# Patient Record
Sex: Female | Born: 1945
Health system: Southern US, Community
[De-identification: ages and names within clinical notes are randomized; demographics above are authoritative.]

## PROBLEM LIST (undated history)

## (undated) DIAGNOSIS — I509 Heart failure, unspecified: Secondary | ICD-10-CM

## (undated) DIAGNOSIS — M1990 Unspecified osteoarthritis, unspecified site: Secondary | ICD-10-CM

## (undated) DIAGNOSIS — I639 Cerebral infarction, unspecified: Secondary | ICD-10-CM

## (undated) DIAGNOSIS — N186 End stage renal disease: Secondary | ICD-10-CM

## (undated) DIAGNOSIS — H3530 Unspecified macular degeneration: Secondary | ICD-10-CM

## (undated) DIAGNOSIS — D649 Anemia, unspecified: Secondary | ICD-10-CM

## (undated) DIAGNOSIS — E785 Hyperlipidemia, unspecified: Secondary | ICD-10-CM

## (undated) DIAGNOSIS — I10 Essential (primary) hypertension: Secondary | ICD-10-CM

## (undated) DIAGNOSIS — N189 Chronic kidney disease, unspecified: Secondary | ICD-10-CM

## (undated) DIAGNOSIS — T8859XA Other complications of anesthesia, initial encounter: Secondary | ICD-10-CM

## (undated) DIAGNOSIS — M109 Gout, unspecified: Secondary | ICD-10-CM

## (undated) DIAGNOSIS — T4145XA Adverse effect of unspecified anesthetic, initial encounter: Secondary | ICD-10-CM

## (undated) HISTORY — PX: TONSILLECTOMY: SUR1361

## (undated) HISTORY — PX: ABDOMINAL HYSTERECTOMY: SHX81

## (undated) HISTORY — PX: APPENDECTOMY: SHX54

---

## 2000-05-30 ENCOUNTER — Inpatient Hospital Stay (HOSPITAL_COMMUNITY): Admission: EM | Admit: 2000-05-30 | Discharge: 2000-06-02 | Payer: Self-pay | Admitting: *Deleted

## 2005-12-06 ENCOUNTER — Ambulatory Visit: Payer: Self-pay | Admitting: Internal Medicine

## 2006-12-26 ENCOUNTER — Ambulatory Visit: Payer: Self-pay | Admitting: Internal Medicine

## 2008-02-26 ENCOUNTER — Ambulatory Visit: Payer: Self-pay | Admitting: Internal Medicine

## 2009-06-25 ENCOUNTER — Emergency Department: Payer: Self-pay | Admitting: Emergency Medicine

## 2013-09-03 DIAGNOSIS — R5381 Other malaise: Secondary | ICD-10-CM | POA: Diagnosis not present

## 2013-09-03 DIAGNOSIS — R300 Dysuria: Secondary | ICD-10-CM | POA: Diagnosis not present

## 2013-09-03 DIAGNOSIS — Z0000 Encounter for general adult medical examination without abnormal findings: Secondary | ICD-10-CM | POA: Diagnosis not present

## 2013-09-03 DIAGNOSIS — E782 Mixed hyperlipidemia: Secondary | ICD-10-CM | POA: Diagnosis not present

## 2013-09-03 DIAGNOSIS — I6789 Other cerebrovascular disease: Secondary | ICD-10-CM | POA: Diagnosis not present

## 2013-09-03 DIAGNOSIS — I10 Essential (primary) hypertension: Secondary | ICD-10-CM | POA: Diagnosis not present

## 2013-09-03 DIAGNOSIS — Z124 Encounter for screening for malignant neoplasm of cervix: Secondary | ICD-10-CM | POA: Diagnosis not present

## 2013-09-03 DIAGNOSIS — Z1272 Encounter for screening for malignant neoplasm of vagina: Secondary | ICD-10-CM | POA: Diagnosis not present

## 2013-09-03 DIAGNOSIS — N898 Other specified noninflammatory disorders of vagina: Secondary | ICD-10-CM | POA: Diagnosis not present

## 2013-09-17 ENCOUNTER — Ambulatory Visit: Payer: Self-pay

## 2013-09-17 DIAGNOSIS — Z0000 Encounter for general adult medical examination without abnormal findings: Secondary | ICD-10-CM | POA: Diagnosis not present

## 2013-09-17 DIAGNOSIS — E559 Vitamin D deficiency, unspecified: Secondary | ICD-10-CM | POA: Diagnosis not present

## 2013-09-17 DIAGNOSIS — E782 Mixed hyperlipidemia: Secondary | ICD-10-CM | POA: Diagnosis not present

## 2013-09-17 DIAGNOSIS — I10 Essential (primary) hypertension: Secondary | ICD-10-CM | POA: Diagnosis not present

## 2013-09-17 DIAGNOSIS — Z1231 Encounter for screening mammogram for malignant neoplasm of breast: Secondary | ICD-10-CM | POA: Diagnosis not present

## 2014-11-30 DIAGNOSIS — M109 Gout, unspecified: Secondary | ICD-10-CM | POA: Diagnosis not present

## 2014-11-30 DIAGNOSIS — R7301 Impaired fasting glucose: Secondary | ICD-10-CM | POA: Diagnosis not present

## 2014-11-30 DIAGNOSIS — I10 Essential (primary) hypertension: Secondary | ICD-10-CM | POA: Diagnosis not present

## 2014-11-30 DIAGNOSIS — E782 Mixed hyperlipidemia: Secondary | ICD-10-CM | POA: Diagnosis not present

## 2015-01-12 DIAGNOSIS — Z0001 Encounter for general adult medical examination with abnormal findings: Secondary | ICD-10-CM | POA: Diagnosis not present

## 2015-01-12 DIAGNOSIS — R7301 Impaired fasting glucose: Secondary | ICD-10-CM | POA: Diagnosis not present

## 2015-01-12 DIAGNOSIS — I10 Essential (primary) hypertension: Secondary | ICD-10-CM | POA: Diagnosis not present

## 2015-01-12 DIAGNOSIS — E559 Vitamin D deficiency, unspecified: Secondary | ICD-10-CM | POA: Diagnosis not present

## 2015-01-12 DIAGNOSIS — E782 Mixed hyperlipidemia: Secondary | ICD-10-CM | POA: Diagnosis not present

## 2015-01-12 DIAGNOSIS — M109 Gout, unspecified: Secondary | ICD-10-CM | POA: Diagnosis not present

## 2015-01-13 DIAGNOSIS — Z1231 Encounter for screening mammogram for malignant neoplasm of breast: Secondary | ICD-10-CM | POA: Diagnosis not present

## 2015-07-30 DIAGNOSIS — M25551 Pain in right hip: Secondary | ICD-10-CM | POA: Diagnosis not present

## 2015-07-30 DIAGNOSIS — Z634 Disappearance and death of family member: Secondary | ICD-10-CM | POA: Diagnosis not present

## 2015-07-30 DIAGNOSIS — M109 Gout, unspecified: Secondary | ICD-10-CM | POA: Diagnosis not present

## 2015-07-30 DIAGNOSIS — I10 Essential (primary) hypertension: Secondary | ICD-10-CM | POA: Diagnosis not present

## 2015-08-03 DIAGNOSIS — R944 Abnormal results of kidney function studies: Secondary | ICD-10-CM | POA: Diagnosis not present

## 2015-08-04 DIAGNOSIS — N390 Urinary tract infection, site not specified: Secondary | ICD-10-CM | POA: Diagnosis not present

## 2015-08-04 DIAGNOSIS — S90561A Insect bite (nonvenomous), right ankle, initial encounter: Secondary | ICD-10-CM | POA: Diagnosis not present

## 2015-08-04 DIAGNOSIS — I10 Essential (primary) hypertension: Secondary | ICD-10-CM | POA: Diagnosis not present

## 2015-08-04 DIAGNOSIS — R2241 Localized swelling, mass and lump, right lower limb: Secondary | ICD-10-CM | POA: Diagnosis not present

## 2015-09-15 DIAGNOSIS — R944 Abnormal results of kidney function studies: Secondary | ICD-10-CM | POA: Diagnosis not present

## 2015-09-15 DIAGNOSIS — R87810 Cervical high risk human papillomavirus (HPV) DNA test positive: Secondary | ICD-10-CM | POA: Diagnosis not present

## 2015-09-15 DIAGNOSIS — Z0001 Encounter for general adult medical examination with abnormal findings: Secondary | ICD-10-CM | POA: Diagnosis not present

## 2015-09-15 DIAGNOSIS — M109 Gout, unspecified: Secondary | ICD-10-CM | POA: Diagnosis not present

## 2015-09-15 DIAGNOSIS — I10 Essential (primary) hypertension: Secondary | ICD-10-CM | POA: Diagnosis not present

## 2015-09-15 DIAGNOSIS — Z124 Encounter for screening for malignant neoplasm of cervix: Secondary | ICD-10-CM | POA: Diagnosis not present

## 2015-12-13 ENCOUNTER — Ambulatory Visit
Admission: RE | Admit: 2015-12-13 | Payer: Medicare Other | Source: Ambulatory Visit | Admitting: Unknown Physician Specialty

## 2015-12-13 ENCOUNTER — Encounter: Admission: RE | Payer: Self-pay | Source: Ambulatory Visit

## 2015-12-13 HISTORY — DX: Hyperlipidemia, unspecified: E78.5

## 2015-12-13 HISTORY — DX: Cerebral infarction, unspecified: I63.9

## 2015-12-13 HISTORY — DX: Essential (primary) hypertension: I10

## 2015-12-13 HISTORY — DX: Gout, unspecified: M10.9

## 2015-12-13 SURGERY — COLONOSCOPY WITH PROPOFOL
Anesthesia: General

## 2016-01-14 ENCOUNTER — Encounter
Admission: RE | Disposition: A | Discharge status: Home / Self Care | Payer: Self-pay | Source: Ambulatory Visit | Attending: Unknown Physician Specialty

## 2016-01-14 ENCOUNTER — Ambulatory Visit
Admission: RE | Admit: 2016-01-14 | Discharge: 2016-01-14 | Disposition: A | Discharge status: Home / Self Care | Payer: Medicare Other | Source: Ambulatory Visit | Attending: Unknown Physician Specialty | Admitting: Unknown Physician Specialty

## 2016-01-14 ENCOUNTER — Ambulatory Visit: Payer: Medicare Other | Admitting: Anesthesiology

## 2016-01-14 DIAGNOSIS — Z87891 Personal history of nicotine dependence: Secondary | ICD-10-CM | POA: Diagnosis not present

## 2016-01-14 DIAGNOSIS — K5790 Diverticulosis of intestine, part unspecified, without perforation or abscess without bleeding: Secondary | ICD-10-CM | POA: Diagnosis not present

## 2016-01-14 DIAGNOSIS — D122 Benign neoplasm of ascending colon: Secondary | ICD-10-CM | POA: Insufficient documentation

## 2016-01-14 DIAGNOSIS — Z880 Allergy status to penicillin: Secondary | ICD-10-CM | POA: Diagnosis not present

## 2016-01-14 DIAGNOSIS — M109 Gout, unspecified: Secondary | ICD-10-CM | POA: Insufficient documentation

## 2016-01-14 DIAGNOSIS — Z79899 Other long term (current) drug therapy: Secondary | ICD-10-CM | POA: Insufficient documentation

## 2016-01-14 DIAGNOSIS — D120 Benign neoplasm of cecum: Secondary | ICD-10-CM | POA: Insufficient documentation

## 2016-01-14 DIAGNOSIS — K640 First degree hemorrhoids: Secondary | ICD-10-CM | POA: Insufficient documentation

## 2016-01-14 DIAGNOSIS — I10 Essential (primary) hypertension: Secondary | ICD-10-CM | POA: Diagnosis not present

## 2016-01-14 DIAGNOSIS — K635 Polyp of colon: Secondary | ICD-10-CM | POA: Diagnosis not present

## 2016-01-14 DIAGNOSIS — Z1211 Encounter for screening for malignant neoplasm of colon: Secondary | ICD-10-CM | POA: Insufficient documentation

## 2016-01-14 DIAGNOSIS — I6930 Unspecified sequelae of cerebral infarction: Secondary | ICD-10-CM | POA: Insufficient documentation

## 2016-01-14 DIAGNOSIS — E785 Hyperlipidemia, unspecified: Secondary | ICD-10-CM | POA: Insufficient documentation

## 2016-01-14 HISTORY — PX: COLONOSCOPY WITH PROPOFOL: SHX5780

## 2016-01-14 LAB — GLUCOSE, CAPILLARY: Glucose-Capillary: 80 mg/dL (ref 65–99)

## 2016-01-14 SURGERY — COLONOSCOPY WITH PROPOFOL
Anesthesia: General

## 2016-01-14 MED ORDER — FENTANYL CITRATE (PF) 100 MCG/2ML IJ SOLN
INTRAMUSCULAR | Status: DC | PRN
  Administered 2016-01-14: 50 ug via INTRAVENOUS

## 2016-01-14 MED ORDER — SODIUM CHLORIDE 0.9 % IV SOLN: INTRAVENOUS | Status: DC

## 2016-01-14 MED ORDER — PROPOFOL 10 MG/ML IV BOLUS
INTRAVENOUS | Status: DC | PRN
Start: 2016-01-14 — End: 2016-01-14
  Administered 2016-01-14: 30 mg via INTRAVENOUS
  Administered 2016-01-14: 20 mg via INTRAVENOUS

## 2016-01-14 MED ORDER — SODIUM CHLORIDE 0.9 % IV SOLN
INTRAVENOUS | Status: DC
  Administered 2016-01-14: 1000 mL via INTRAVENOUS

## 2016-01-14 MED ORDER — LIDOCAINE HCL (PF) 2 % IJ SOLN
INTRAMUSCULAR | Status: DC | PRN
  Administered 2016-01-14: 60 mg

## 2016-01-14 MED ORDER — MIDAZOLAM HCL 5 MG/5ML IJ SOLN
INTRAMUSCULAR | Status: DC | PRN
  Administered 2016-01-14: 1 mg via INTRAVENOUS

## 2016-01-14 MED ORDER — PHENYLEPHRINE HCL 10 MG/ML IJ SOLN
INTRAMUSCULAR | Status: DC | PRN
  Administered 2016-01-14: 100 ug via INTRAVENOUS

## 2016-01-14 MED ORDER — PROPOFOL 500 MG/50ML IV EMUL
INTRAVENOUS | Status: DC | PRN
  Administered 2016-01-14: 100 ug/kg/min via INTRAVENOUS

## 2016-01-14 NOTE — Transfer of Care (Signed)
Immediate Anesthesia Transfer of Care Note  Patient: Kristen Francis  Procedure(s) Performed: Procedure(s): COLONOSCOPY WITH PROPOFOL (N/A)  Patient Location: PACU  Anesthesia Type:General  Level of Consciousness: sedated  Airway & Oxygen Therapy: Patient Spontanous Breathing and Patient connected to nasal cannula oxygen  Post-op Assessment: Report given to RN and Post -op Vital signs reviewed and stable  Post vital signs: Reviewed and stable  Last Vitals:  Filed Vitals:   01/14/16 0904  BP: 158/98  Pulse: 85  Temp: 36.2 C  Resp: 16    Complications: No apparent anesthesia complications

## 2016-01-14 NOTE — Anesthesia Postprocedure Evaluation (Signed)
Anesthesia Post Note  Patient: Kristen Francis  Procedure(s) Performed: Procedure(s) (LRB): COLONOSCOPY WITH PROPOFOL (N/A)  Patient location during evaluation: PACU Anesthesia Type: General Level of consciousness: awake and alert Pain management: pain level controlled Vital Signs Assessment: post-procedure vital signs reviewed and stable Respiratory status: spontaneous breathing, nonlabored ventilation, respiratory function stable and patient connected to nasal cannula oxygen Cardiovascular status: blood pressure returned to baseline and stable Postop Assessment: no signs of nausea or vomiting Anesthetic complications: no    Last Vitals:  Filed Vitals:   01/14/16 1020 01/14/16 1030  BP: 135/82 135/77  Pulse: 66 64  Temp:    Resp: 11 11    Last Pain: There were no vitals filed for this visit.               Molli Barrows

## 2016-01-14 NOTE — H&P (Signed)
   Primary Care Physician:  Lavera Guise, MD Primary Gastroenterologist:  Dr. Vira Agar  Pre-Procedure History & Physical: HPI:  Kristen Francis is a 70 y.o. female is here for an colonoscopy.   Past Medical History  Diagnosis Date  . Stroke ([place])   . Hypertension   . Hyperlipemia   . Gout     Past Surgical History  Procedure Laterality Date  . Abdominal hysterectomy    . Appendectomy      Prior to Admission medications   Medication Sig Start Date End Date Taking? Authorizing Provider  allopurinol (ZYLOPRIM) 100 MG tablet Take 100 mg by mouth daily.   Yes Historical Provider, MD  amLODipine (NORVASC) 10 MG tablet Take 10 mg by mouth daily.   Yes Historical Provider, MD  carvedilol (COREG) 6.25 MG tablet Take 6.25 mg by mouth 2 (two) times daily with a meal.   Yes Historical Provider, MD  hydrochlorothiazide (HYDRODIURIL) 25 MG tablet Take 25 mg by mouth daily.   Yes Historical Provider, MD  sulfamethoxazole-trimethoprim (BACTRIM DS,SEPTRA DS) 800-160 MG tablet Take 1 tablet by mouth 2 (two) times daily.   Yes Historical Provider, MD    Allergies as of 12/20/2015 - Review Complete 12/10/2015  Allergen Reaction Noted  . Penicillins  12/10/2015    No family history on file.  Social History   Social History  . Marital Status: Married    Spouse Name: N/A  . Number of Children: N/A  . Years of Education: N/A   Occupational History  . Not on file.   Social History Main Topics  . Smoking status: Former Research scientist (life sciences)  . Smokeless tobacco: Not on file  . Alcohol Use: Not on file  . Drug Use: Not on file  . Sexual Activity: Not on file   Other Topics Concern  . Not on file   Social History Narrative  . No narrative on file    Review of Systems: See HPI, otherwise negative ROS  Physical Exam: BP 158/98 mmHg  Pulse 85  Temp(Src) 97.2 F (36.2 C) (Tympanic)  Resp 16  Ht 5\' 6"  (1.676 m)  Wt 77.565 kg (171 lb)  BMI 27.61 kg/m2  SpO2 100% General:   Alert,  pleasant  and cooperative in NAD Head:  Normocephalic and atraumatic. Neck:  Supple; no masses or thyromegaly. Lungs:  Clear throughout to auscultation.    Heart:  Regular rate and rhythm. Abdomen:  Soft, nontender and nondistended. Normal bowel sounds, without guarding, and without rebound.   Neurologic:  Alert and  oriented x4;  grossly normal neurologically.  Impression/Plan: Kristen Francis is here for an colonoscopy to be performed for screening  Risks, benefits, limitations, and alternatives regarding  colonoscopy have been reviewed with the patient.  Questions have been answered.  All parties agreeable.   Gaylyn Cheers, MD  01/14/2016, 9:19 AM

## 2016-01-14 NOTE — Op Note (Signed)
[facility] Patient Name: Kristen Francis Procedure Date: 01/14/2016 9:23 AM MRN: [id number] Account #: [id number] Date of Birth: 1946/05/04 Admit Type: Outpatient Age: 70 Room: [facility] ENDO ROOM 1 Gender: Female Note Status: Finalized Procedure:         Colonoscopy Indications:       Screening for colorectal malignant neoplasm Providers:         Manya Silvas, MD Referring MD:      Peterson Lombard (Referring MD) Medicines:         Propofol per Anesthesia Complications:     No immediate complications. Procedure:         Pre-Anesthesia Assessment:                    - After reviewing the risks and benefits, the patient was                     deemed in satisfactory condition to undergo the procedure.                    After obtaining informed consent, the colonoscope was                     passed under direct vision. Throughout the procedure, the                     patient's blood pressure, pulse, and oxygen saturations                     were monitored continuously. The Colonoscope was                     introduced through the anus and advanced to the the cecum,                     identified by appendiceal orifice and ileocecal valve. The                     colonoscopy was performed without difficulty. The patient                     tolerated the procedure well. The quality of the bowel                     preparation was excellent. Findings:      A diminutive polyp was found in the cecum. The polyp was sessile. The       polyp was removed with a jumbo cold forceps. Resection and retrieval       were complete.      A diminutive polyp was found in the proximal ascending colon. The polyp       was sessile. The polyp was removed with a jumbo cold forceps. Resection       and retrieval were complete.      Internal hemorrhoids were found during endoscopy. The hemorrhoids were       small and Grade I (internal hemorrhoids that do not prolapse).     The exam was otherwise without abnormality. Impression:        - One diminutive polyp in the cecum. Resected and                     retrieved.                    -  One diminutive polyp in the proximal ascending colon.                     Resected and retrieved.                    - Internal hemorrhoids.                    - The examination was otherwise normal. Recommendation:    - Await pathology results. Manya Silvas, MD 01/14/2016 9:50:59 AM This report has been signed electronically. Number of Addenda: 0 Note Initiated On: 01/14/2016 9:23 AM Scope Withdrawal Time: 0 hours 11 minutes 27 seconds  Total Procedure Duration: 0 hours 16 minutes 1 second       [facility]

## 2016-01-14 NOTE — Anesthesia Preprocedure Evaluation (Signed)
Anesthesia Evaluation  Patient identified by MRN, date of birth, ID band Patient awake    Reviewed: Allergy & Precautions, H&P , NPO status , Patient's Chart, lab work & pertinent test results, reviewed documented beta blocker date and time   Airway Mallampati: II   Neck ROM: full    Dental  (+) Teeth Intact, Implants   Pulmonary neg pulmonary ROS, former smoker,    Pulmonary exam normal        Cardiovascular Exercise Tolerance: Good hypertension, negative cardio ROS Normal cardiovascular exam     Neuro/Psych Right upper and lower weakness sp stroke CVA, Residual Symptoms negative neurological ROS  negative psych ROS   GI/Hepatic negative GI ROS, Neg liver ROS,   Endo/Other  negative endocrine ROS  Renal/GU negative Renal ROS  negative genitourinary   Musculoskeletal   Abdominal   Peds  Hematology negative hematology ROS (+)   Anesthesia Other Findings Past Medical History:   Stroke ([place])                                                 Hypertension                                                 Hyperlipemia                                                 Gout                                                       Past Surgical History:   ABDOMINAL HYSTERECTOMY                                        APPENDECTOMY                                                BMI    Body Mass Index   27.61 kg/m 2     Reproductive/Obstetrics                             Anesthesia Physical Anesthesia Plan  ASA: III  Anesthesia Plan: General   Post-op Pain Management:    Induction:   Airway Management Planned:   Additional Equipment:   Intra-op Plan:   Post-operative Plan:   Informed Consent: I have reviewed the patients History and Physical, chart, labs and discussed the procedure including the risks, benefits and alternatives for the proposed anesthesia with the patient or authorized  representative who has indicated his/her understanding and acceptance.   Dental Advisory Given  Plan Discussed with: CRNA  Anesthesia Plan Comments:  Anesthesia Quick Evaluation  

## 2016-01-15 ENCOUNTER — Encounter: Payer: Self-pay | Admitting: Unknown Physician Specialty

## 2016-01-17 LAB — SURGICAL PATHOLOGY

## 2016-08-08 ENCOUNTER — Other Ambulatory Visit: Payer: Self-pay

## 2016-10-08 ENCOUNTER — Emergency Department
Admission: EM | Admit: 2016-10-08 | Discharge: 2016-10-09 | Disposition: A | Discharge status: Home / Self Care | Payer: Medicare Other | Attending: Emergency Medicine | Admitting: Emergency Medicine

## 2016-10-08 DIAGNOSIS — N189 Chronic kidney disease, unspecified: Secondary | ICD-10-CM | POA: Diagnosis not present

## 2016-10-08 DIAGNOSIS — E860 Dehydration: Secondary | ICD-10-CM | POA: Insufficient documentation

## 2016-10-08 DIAGNOSIS — Z79899 Other long term (current) drug therapy: Secondary | ICD-10-CM | POA: Diagnosis not present

## 2016-10-08 DIAGNOSIS — R197 Diarrhea, unspecified: Secondary | ICD-10-CM

## 2016-10-08 DIAGNOSIS — N289 Disorder of kidney and ureter, unspecified: Secondary | ICD-10-CM

## 2016-10-08 DIAGNOSIS — Z87891 Personal history of nicotine dependence: Secondary | ICD-10-CM | POA: Insufficient documentation

## 2016-10-08 DIAGNOSIS — I10 Essential (primary) hypertension: Secondary | ICD-10-CM | POA: Insufficient documentation

## 2016-10-08 LAB — CBC
HCT: 28.1 % — ABNORMAL LOW (ref 35.0–47.0)
HEMOGLOBIN: 9.6 g/dL — AB (ref 12.0–16.0)
MCH: 27.7 pg (ref 26.0–34.0)
MCHC: 34.1 g/dL (ref 32.0–36.0)
MCV: 81.4 fL (ref 80.0–100.0)
Platelets: 308 10*3/uL (ref 150–440)
RBC: 3.45 MIL/uL — AB (ref 3.80–5.20)
RDW: 17 % — ABNORMAL HIGH (ref 11.5–14.5)
WBC: 6.1 10*3/uL (ref 3.6–11.0)

## 2016-10-08 LAB — URINALYSIS COMPLETE WITH MICROSCOPIC (ARMC ONLY)
Bacteria, UA: NONE SEEN
Bilirubin Urine: NEGATIVE
Glucose, UA: NEGATIVE mg/dL
HGB URINE DIPSTICK: NEGATIVE
KETONES UR: NEGATIVE mg/dL
LEUKOCYTES UA: NEGATIVE
NITRITE: NEGATIVE
PROTEIN: 100 mg/dL — AB
SPECIFIC GRAVITY, URINE: 1.013 (ref 1.005–1.030)
pH: 6 (ref 5.0–8.0)

## 2016-10-08 LAB — BASIC METABOLIC PANEL
ANION GAP: 9 (ref 5–15)
BUN: 50 mg/dL — ABNORMAL HIGH (ref 6–20)
CALCIUM: 9 mg/dL (ref 8.9–10.3)
CO2: 24 mmol/L (ref 22–32)
Chloride: 109 mmol/L (ref 101–111)
Creatinine, Ser: 2.8 mg/dL — ABNORMAL HIGH (ref 0.44–1.00)
GFR, EST AFRICAN AMERICAN: 19 mL/min — AB (ref >60)
GFR, EST NON AFRICAN AMERICAN: 16 mL/min — AB (ref >60)
Glucose, Bld: 107 mg/dL — ABNORMAL HIGH (ref 65–99)
POTASSIUM: 3.4 mmol/L — AB (ref 3.5–5.1)
SODIUM: 142 mmol/L (ref 135–145)

## 2016-10-08 MED ORDER — LOPERAMIDE HCL 2 MG PO TABS: 4.0000 mg | ORAL_TABLET | Freq: Four times a day (QID) | ORAL | 0 refills | Status: DC | PRN

## 2016-10-08 MED ORDER — ONDANSETRON 4 MG PO TBDP: 4.0000 mg | ORAL_TABLET | Freq: Three times a day (TID) | ORAL | 0 refills | Status: DC | PRN

## 2016-10-08 MED ORDER — SODIUM CHLORIDE 0.9 % IV BOLUS (SEPSIS)
1000.0000 mL | Freq: Once | INTRAVENOUS | Status: AC
  Administered 2016-10-08: 1000 mL via INTRAVENOUS

## 2016-10-08 NOTE — ED Triage Notes (Addendum)
Patient reports felling weak, and "trouble controlling my water".  Symptoms began Friday.  Patient reports CVA in 2000 with residual right sided weakness.  Reports weakness on right side has felt different since Friday.

## 2016-10-08 NOTE — ED Provider Notes (Signed)
[facility] Emergency Department Provider Note  ____________________________________________  Time seen: Approximately 9:52 PM  I have reviewed the triage vital signs and the nursing notes.   HISTORY  Chief Complaint Weakness and Chills    HPI Kristen Francis is a 70 y.o. female who complains of malaise generalized weakness and chills for the past 2 days. She also has been having frequent watery diarrhea for the past 2 days. No bloody diarrhea or melena. No urinary frequency or incontinence or dysuria. No chest pain shortness of breath. No fever. Denies any abdominal pain.     Past Medical History:  Diagnosis Date  . Gout   . Hyperlipemia   . Hypertension   . Stroke [facility])      There are no active problems to display for this patient.    Past Surgical History:  Procedure Laterality Date  . ABDOMINAL HYSTERECTOMY    . APPENDECTOMY    . COLONOSCOPY WITH PROPOFOL N/A 01/14/2016   Procedure: COLONOSCOPY WITH PROPOFOL;  Surgeon: Manya Silvas, MD;  Location: [facility] ENDOSCOPY;  Service: Endoscopy;  Laterality: N/A;     Prior to Admission medications   Medication Sig Start Date End Date Taking? Authorizing Provider  allopurinol (ZYLOPRIM) 100 MG tablet Take 100 mg by mouth daily.    Historical Provider, MD  amLODipine (NORVASC) 10 MG tablet Take 10 mg by mouth daily.    Historical Provider, MD  carvedilol (COREG) 6.25 MG tablet Take 6.25 mg by mouth 2 (two) times daily with a meal.    Historical Provider, MD  hydrochlorothiazide (HYDRODIURIL) 25 MG tablet Take 25 mg by mouth daily.    Historical Provider, MD  sulfamethoxazole-trimethoprim (BACTRIM DS,SEPTRA DS) 800-160 MG tablet Take 1 tablet by mouth 2 (two) times daily.    Historical Provider, MD     Allergies Penicillins   No family history on file.  Social History Social History  Substance Use Topics  . Smoking status: Former Research scientist (life sciences)  . Smokeless tobacco: Not on file  . Alcohol use Not  on file    Review of Systems  Constitutional:   No fever Positive chills.   Cardiovascular:   No chest pain. Respiratory:   No dyspnea or cough. Gastrointestinal:   Negative for abdominal pain, vomiting , positive diarrhea .  Genitourinary:   Negative for dysuria or difficulty urinating. 10-point ROS otherwise negative.  ____________________________________________   PHYSICAL EXAM:  VITAL SIGNS: ED Triage Vitals  Enc Vitals Group     BP 10/08/16 1940 (!) 173/99     Pulse Rate 10/08/16 1940 96     Resp 10/08/16 1940 20     Temp 10/08/16 1940 98.2 F (36.8 C)     Temp Source 10/08/16 1940 Oral     SpO2 10/08/16 1940 100 %     Weight 10/08/16 1940 175 lb (79.4 kg)     Height 10/08/16 1940 5\' 6"  (1.676 m)     Head Circumference --      Peak Flow --      Pain Score 10/08/16 1941 0     Pain Loc --      Pain Edu? --      Excl. in [place]? --     Vital signs reviewed, nursing assessments reviewed.   Constitutional:   Alert and oriented. Well appearing and in no distress. Eyes:   No scleral icterus. No conjunctival pallor. PERRL. EOMI.  No nystagmus. ENT   Head:   Normocephalic and atraumatic.   Nose:  No congestion/rhinnorhea. No septal hematoma   Mouth/Throat:   MMM, no pharyngeal erythema. No peritonsillar mass.    Neck:   No stridor. No SubQ emphysema. No meningismus. Hematological/Lymphatic/Immunilogical:   No cervical lymphadenopathy. Cardiovascular:   RRR. Symmetric bilateral radial and DP pulses.  No murmurs.  Respiratory:   Normal respiratory effort without tachypnea nor retractions. Breath sounds are clear and equal bilaterally. No wheezes/rales/rhonchi. Gastrointestinal:   Soft and nontender. Non distended. There is no CVA tenderness.  No rebound, rigidity, or guarding. Genitourinary:   deferred Musculoskeletal:   Nontender with normal range of motion in all extremities. No joint effusions.  No lower extremity tenderness.  No edema. Neurologic:    Normal speech and language.  CN 2-10 normal. Motor grossly intact. No gross focal neurologic deficits are appreciated.  Skin:    Skin is warm, dry and intact. No rash noted.  No petechiae, purpura, or bullae.  ____________________________________________    LABS (pertinent positives/negatives) (all labs ordered are listed, but only abnormal results are displayed) Labs Reviewed  BASIC METABOLIC PANEL - Abnormal; Notable for the following:       Result Value   Potassium 3.4 (*)    Glucose, Bld 107 (*)    BUN 50 (*)    Creatinine, Ser 2.80 (*)    GFR calc non Af Amer 16 (*)    GFR calc Af Amer 19 (*)    All other components within normal limits  CBC - Abnormal; Notable for the following:    RBC 3.45 (*)    Hemoglobin 9.6 (*)    HCT 28.1 (*)    RDW 17.0 (*)    All other components within normal limits  URINALYSIS COMPLETEWITH MICROSCOPIC ([facility] ONLY) - Abnormal; Notable for the following:    Color, Urine STRAW (*)    APPearance CLEAR (*)    Protein, ur 100 (*)    Squamous Epithelial / LPF 0-5 (*)    All other components within normal limits  CBG MONITORING, ED   ____________________________________________   EKG  Interpreted by me Sinus rhythm rate of 99. Normal axis and intervals. QRS C segments and T waves. 3 PVCs on the strip.  ____________________________________________    RADIOLOGY    ____________________________________________   PROCEDURES Procedures  ____________________________________________   INITIAL IMPRESSION / ASSESSMENT AND PLAN / ED COURSE  Pertinent labs & imaging results that were available during my care of the patient were reviewed by me and considered in my medical decision making (see chart for details).  Patient presents with watery diarrhea, symptoms consistent with viral syndrome, and acute renal insufficiency with a creatinine of 2.8. I have no baseline labs to compare to. Very well appearing and tolerating oral intake so have  her hydrated orally while also giving IV fluids and recheck the creatinine. If improving and patient continues to feel better, will plan for outpatient follow-up with her primary care doctor Clayborn Bigness.    ----------------------------------------- 10:59 PM on 10/08/2016 -----------------------------------------  Still awaiting repeat metabolic panel. Case will be signed out to oncoming physician at 11:00 PM to follow up on lab results to determine disposition.   Clinical Course    ____________________________________________   FINAL CLINICAL IMPRESSION(S) / ED DIAGNOSES  Final diagnoses:  Dehydration  Acute renal insufficiency       Portions of this note were generated with dragon dictation software. Dictation errors may occur despite best attempts at proofreading.    Carrie Mew, MD 10/08/16 2259

## 2016-10-09 LAB — BASIC METABOLIC PANEL
Anion gap: 9 (ref 5–15)
BUN: 48 mg/dL — ABNORMAL HIGH (ref 6–20)
CHLORIDE: 110 mmol/L (ref 101–111)
CO2: 22 mmol/L (ref 22–32)
Calcium: 8.5 mg/dL — ABNORMAL LOW (ref 8.9–10.3)
Creatinine, Ser: 2.55 mg/dL — ABNORMAL HIGH (ref 0.44–1.00)
GFR calc non Af Amer: 18 mL/min — ABNORMAL LOW (ref >60)
GFR, EST AFRICAN AMERICAN: 21 mL/min — AB (ref >60)
Glucose, Bld: 163 mg/dL — ABNORMAL HIGH (ref 65–99)
POTASSIUM: 3.2 mmol/L — AB (ref 3.5–5.1)
SODIUM: 141 mmol/L (ref 135–145)

## 2016-10-09 NOTE — ED Notes (Signed)
ED Provider at bedside. 

## 2016-10-09 NOTE — ED Provider Notes (Addendum)
-----------------------------------------   2:25 AM on 10/09/2016 -----------------------------------------   Blood pressure (!) 151/84, pulse 87, temperature 98.2 F (36.8 C), temperature source Oral, resp. rate 18, height 5\' 6"  (1.676 m), weight 175 lb (79.4 kg), SpO2 100 %.  Assuming care from Dr. Joni Fears.  In short, Kristen Francis is a 70 y.o. female with a chief complaint of Weakness and Chills .  Refer to the original H&P for additional details.  The current plan of care is to follow-up the repeat BMP.  The patient's creatinine on the repeat chemistry is mildly improved. It is 2.55. According to Dr. Joni Fears he feels that the patient's symptoms are due to dehydration. As it is improved slightly he did feel she could be discharged home. I will discuss with the patient and I will discharge the patient to follow-up with her primary care physician.  After speaking with the patient she reports that she hadn't been having any vomiting or diarrhea but she has been urinating a lot. The patient reports that sometimes she can barely hold it. The patient is on hydrochlorothiazide. Since she is feeling better and she is able to urinate as well as drink I will still have her go home and follow up with her primary care physician. I feel that the patient's renal insufficiency may be due to diuresis from her hydrochlorothiazide. The patient will contact her physician about discontinuing this medication.    Loney Hering, MD 10/09/16 8016    Loney Hering, MD 10/09/16 [phone]

## 2016-10-10 DIAGNOSIS — E860 Dehydration: Secondary | ICD-10-CM | POA: Diagnosis not present

## 2016-10-10 DIAGNOSIS — R944 Abnormal results of kidney function studies: Secondary | ICD-10-CM | POA: Diagnosis not present

## 2016-10-10 DIAGNOSIS — R5381 Other malaise: Secondary | ICD-10-CM | POA: Diagnosis not present

## 2016-10-10 DIAGNOSIS — N390 Urinary tract infection, site not specified: Secondary | ICD-10-CM | POA: Diagnosis not present

## 2016-10-10 DIAGNOSIS — I10 Essential (primary) hypertension: Secondary | ICD-10-CM | POA: Diagnosis not present

## 2016-10-16 DIAGNOSIS — E782 Mixed hyperlipidemia: Secondary | ICD-10-CM | POA: Diagnosis not present

## 2016-10-16 DIAGNOSIS — E860 Dehydration: Secondary | ICD-10-CM | POA: Diagnosis not present

## 2016-10-16 DIAGNOSIS — N390 Urinary tract infection, site not specified: Secondary | ICD-10-CM | POA: Diagnosis not present

## 2016-10-16 DIAGNOSIS — I10 Essential (primary) hypertension: Secondary | ICD-10-CM | POA: Diagnosis not present

## 2016-10-16 DIAGNOSIS — E538 Deficiency of other specified B group vitamins: Secondary | ICD-10-CM | POA: Diagnosis not present

## 2016-10-16 DIAGNOSIS — N184 Chronic kidney disease, stage 4 (severe): Secondary | ICD-10-CM | POA: Diagnosis not present

## 2016-10-16 DIAGNOSIS — N183 Chronic kidney disease, stage 3 (moderate): Secondary | ICD-10-CM | POA: Diagnosis not present

## 2016-11-14 DIAGNOSIS — I10 Essential (primary) hypertension: Secondary | ICD-10-CM | POA: Diagnosis not present

## 2016-11-14 DIAGNOSIS — D649 Anemia, unspecified: Secondary | ICD-10-CM | POA: Diagnosis not present

## 2016-11-14 DIAGNOSIS — N183 Chronic kidney disease, stage 3 (moderate): Secondary | ICD-10-CM | POA: Diagnosis not present

## 2016-11-14 DIAGNOSIS — R5381 Other malaise: Secondary | ICD-10-CM | POA: Diagnosis not present

## 2016-11-14 DIAGNOSIS — E860 Dehydration: Secondary | ICD-10-CM | POA: Diagnosis not present

## 2016-11-22 DIAGNOSIS — N183 Chronic kidney disease, stage 3 (moderate): Secondary | ICD-10-CM | POA: Diagnosis not present

## 2016-12-11 DIAGNOSIS — D649 Anemia, unspecified: Secondary | ICD-10-CM | POA: Diagnosis not present

## 2016-12-11 DIAGNOSIS — N183 Chronic kidney disease, stage 3 (moderate): Secondary | ICD-10-CM | POA: Diagnosis not present

## 2016-12-11 DIAGNOSIS — I10 Essential (primary) hypertension: Secondary | ICD-10-CM | POA: Diagnosis not present

## 2016-12-12 DIAGNOSIS — D510 Vitamin B12 deficiency anemia due to intrinsic factor deficiency: Secondary | ICD-10-CM | POA: Diagnosis not present

## 2016-12-12 DIAGNOSIS — I129 Hypertensive chronic kidney disease with stage 1 through stage 4 chronic kidney disease, or unspecified chronic kidney disease: Secondary | ICD-10-CM | POA: Diagnosis not present

## 2016-12-12 DIAGNOSIS — M109 Gout, unspecified: Secondary | ICD-10-CM | POA: Diagnosis not present

## 2016-12-12 DIAGNOSIS — R809 Proteinuria, unspecified: Secondary | ICD-10-CM | POA: Diagnosis not present

## 2016-12-12 DIAGNOSIS — D631 Anemia in chronic kidney disease: Secondary | ICD-10-CM | POA: Diagnosis not present

## 2016-12-12 DIAGNOSIS — N184 Chronic kidney disease, stage 4 (severe): Secondary | ICD-10-CM | POA: Diagnosis not present

## 2016-12-12 DIAGNOSIS — E1122 Type 2 diabetes mellitus with diabetic chronic kidney disease: Secondary | ICD-10-CM | POA: Diagnosis not present

## 2016-12-13 DIAGNOSIS — D631 Anemia in chronic kidney disease: Secondary | ICD-10-CM | POA: Diagnosis not present

## 2016-12-13 DIAGNOSIS — N184 Chronic kidney disease, stage 4 (severe): Secondary | ICD-10-CM | POA: Diagnosis not present

## 2016-12-13 DIAGNOSIS — R809 Proteinuria, unspecified: Secondary | ICD-10-CM | POA: Diagnosis not present

## 2016-12-13 DIAGNOSIS — M109 Gout, unspecified: Secondary | ICD-10-CM | POA: Diagnosis not present

## 2016-12-13 DIAGNOSIS — I129 Hypertensive chronic kidney disease with stage 1 through stage 4 chronic kidney disease, or unspecified chronic kidney disease: Secondary | ICD-10-CM | POA: Diagnosis not present

## 2017-01-18 DIAGNOSIS — R809 Proteinuria, unspecified: Secondary | ICD-10-CM | POA: Diagnosis not present

## 2017-01-18 DIAGNOSIS — E559 Vitamin D deficiency, unspecified: Secondary | ICD-10-CM | POA: Diagnosis not present

## 2017-01-18 DIAGNOSIS — I129 Hypertensive chronic kidney disease with stage 1 through stage 4 chronic kidney disease, or unspecified chronic kidney disease: Secondary | ICD-10-CM | POA: Diagnosis not present

## 2017-01-18 DIAGNOSIS — N184 Chronic kidney disease, stage 4 (severe): Secondary | ICD-10-CM | POA: Diagnosis not present

## 2017-01-18 DIAGNOSIS — D631 Anemia in chronic kidney disease: Secondary | ICD-10-CM | POA: Diagnosis not present

## 2017-01-18 DIAGNOSIS — N2581 Secondary hyperparathyroidism of renal origin: Secondary | ICD-10-CM | POA: Diagnosis not present

## 2017-06-29 DIAGNOSIS — H35033 Hypertensive retinopathy, bilateral: Secondary | ICD-10-CM | POA: Diagnosis not present

## 2017-09-06 DIAGNOSIS — N184 Chronic kidney disease, stage 4 (severe): Secondary | ICD-10-CM | POA: Diagnosis not present

## 2017-09-06 DIAGNOSIS — I639 Cerebral infarction, unspecified: Secondary | ICD-10-CM | POA: Diagnosis not present

## 2017-09-06 DIAGNOSIS — I129 Hypertensive chronic kidney disease with stage 1 through stage 4 chronic kidney disease, or unspecified chronic kidney disease: Secondary | ICD-10-CM | POA: Diagnosis not present

## 2017-09-06 DIAGNOSIS — Z79899 Other long term (current) drug therapy: Secondary | ICD-10-CM | POA: Diagnosis not present

## 2017-09-06 DIAGNOSIS — I69351 Hemiplegia and hemiparesis following cerebral infarction affecting right dominant side: Secondary | ICD-10-CM | POA: Diagnosis not present

## 2017-09-06 DIAGNOSIS — I10 Essential (primary) hypertension: Secondary | ICD-10-CM | POA: Diagnosis not present

## 2017-09-06 DIAGNOSIS — D62 Acute posthemorrhagic anemia: Secondary | ICD-10-CM | POA: Diagnosis not present

## 2017-09-06 DIAGNOSIS — M109 Gout, unspecified: Secondary | ICD-10-CM | POA: Diagnosis not present

## 2017-09-06 DIAGNOSIS — R809 Proteinuria, unspecified: Secondary | ICD-10-CM | POA: Diagnosis not present

## 2017-09-06 DIAGNOSIS — I69322 Dysarthria following cerebral infarction: Secondary | ICD-10-CM | POA: Diagnosis not present

## 2017-09-17 ENCOUNTER — Inpatient Hospital Stay: Payer: Medicare Other | Attending: Hematology and Oncology | Admitting: Hematology and Oncology

## 2017-09-17 ENCOUNTER — Inpatient Hospital Stay: Payer: Medicare Other

## 2017-09-17 ENCOUNTER — Other Ambulatory Visit: Payer: Self-pay

## 2017-09-17 ENCOUNTER — Encounter: Payer: Self-pay | Admitting: Hematology and Oncology

## 2017-09-17 VITALS — BP 153/82 | HR 86 | Temp 98.3°F | Resp 14 | Ht 66.0 in | Wt 163.0 lb

## 2017-09-17 DIAGNOSIS — M170 Bilateral primary osteoarthritis of knee: Secondary | ICD-10-CM | POA: Diagnosis not present

## 2017-09-17 DIAGNOSIS — Z880 Allergy status to penicillin: Secondary | ICD-10-CM | POA: Diagnosis not present

## 2017-09-17 DIAGNOSIS — D472 Monoclonal gammopathy: Secondary | ICD-10-CM

## 2017-09-17 DIAGNOSIS — D649 Anemia, unspecified: Secondary | ICD-10-CM | POA: Diagnosis not present

## 2017-09-17 DIAGNOSIS — Z79899 Other long term (current) drug therapy: Secondary | ICD-10-CM | POA: Diagnosis not present

## 2017-09-17 DIAGNOSIS — Z87891 Personal history of nicotine dependence: Secondary | ICD-10-CM | POA: Insufficient documentation

## 2017-09-17 DIAGNOSIS — M25511 Pain in right shoulder: Secondary | ICD-10-CM | POA: Diagnosis not present

## 2017-09-17 DIAGNOSIS — R809 Proteinuria, unspecified: Secondary | ICD-10-CM | POA: Insufficient documentation

## 2017-09-17 DIAGNOSIS — D509 Iron deficiency anemia, unspecified: Secondary | ICD-10-CM | POA: Diagnosis not present

## 2017-09-17 DIAGNOSIS — N189 Chronic kidney disease, unspecified: Secondary | ICD-10-CM | POA: Diagnosis not present

## 2017-09-17 DIAGNOSIS — I129 Hypertensive chronic kidney disease with stage 1 through stage 4 chronic kidney disease, or unspecified chronic kidney disease: Secondary | ICD-10-CM | POA: Insufficient documentation

## 2017-09-17 DIAGNOSIS — Z8673 Personal history of transient ischemic attack (TIA), and cerebral infarction without residual deficits: Secondary | ICD-10-CM | POA: Diagnosis not present

## 2017-09-17 DIAGNOSIS — E785 Hyperlipidemia, unspecified: Secondary | ICD-10-CM

## 2017-09-17 LAB — CBC WITH DIFFERENTIAL/PLATELET
Basophils Absolute: 0.1 10*3/uL (ref 0–0.1)
Basophils Relative: 2 %
Eosinophils Absolute: 0.2 10*3/uL (ref 0–0.7)
Eosinophils Relative: 3 %
HCT: 25.5 % — ABNORMAL LOW (ref 35.0–47.0)
Hemoglobin: 8.4 g/dL — ABNORMAL LOW (ref 12.0–16.0)
Lymphocytes Relative: 23 %
Lymphs Abs: 1.2 10*3/uL (ref 1.0–3.6)
MCH: 26.8 pg (ref 26.0–34.0)
MCHC: 32.9 g/dL (ref 32.0–36.0)
MCV: 81.4 fL (ref 80.0–100.0)
Monocytes Absolute: 0.3 10*3/uL (ref 0.2–0.9)
Monocytes Relative: 6 %
Neutro Abs: 3.4 10*3/uL (ref 1.4–6.5)
Neutrophils Relative %: 66 %
Platelets: 379 10*3/uL (ref 150–440)
RBC: 3.14 MIL/uL — ABNORMAL LOW (ref 3.80–5.20)
RDW: 18.4 % — ABNORMAL HIGH (ref 11.5–14.5)
WBC: 5.1 10*3/uL (ref 3.6–11.0)

## 2017-09-17 LAB — IRON AND TIBC
Iron: 36 ug/dL (ref 28–170)
Saturation Ratios: 11 % (ref 10.4–31.8)
TIBC: 317 ug/dL (ref 250–450)
UIBC: 281 ug/dL

## 2017-09-17 LAB — VITAMIN B12: Vitamin B-12: 1857 pg/mL — ABNORMAL HIGH (ref 180–914)

## 2017-09-17 LAB — FOLATE: Folate: 18.4 ng/mL (ref >5.9)

## 2017-09-17 LAB — RETICULOCYTES
RBC.: 3.2 MIL/uL — ABNORMAL LOW (ref 3.80–5.20)
Retic Count, Absolute: 48 10*3/uL (ref 19.0–183.0)
Retic Ct Pct: 1.5 % (ref 0.4–3.1)

## 2017-09-17 LAB — SEDIMENTATION RATE: Sed Rate: 83 mm/hr — ABNORMAL HIGH (ref 0–30)

## 2017-09-17 LAB — FERRITIN: Ferritin: 25 ng/mL (ref 11–307)

## 2017-09-17 LAB — TSH: TSH: 1.788 u[IU]/mL (ref 0.350–4.500)

## 2017-09-17 NOTE — Progress Notes (Signed)
New patient here for anemia, chronic kidney disease. She denies having any pain. She is nervous about her kidney disease and worries about having to have dialysis one day. She declines to take the Flu Vaccine, and states that she has never had one.
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
## 2017-09-18 LAB — KAPPA/LAMBDA LIGHT CHAINS
Kappa free light chain: 102.3 mg/L — ABNORMAL HIGH (ref 3.3–19.4)
Kappa, lambda light chain ratio: 2.07 — ABNORMAL HIGH (ref 0.26–1.65)
Lambda free light chains: 49.5 mg/L — ABNORMAL HIGH (ref 5.7–26.3)

## 2017-09-18 LAB — ERYTHROPOIETIN: Erythropoietin: 12.9 m[IU]/mL (ref 2.6–18.5)

## 2017-09-20 LAB — MULTIPLE MYELOMA PANEL, SERUM
Albumin SerPl Elph-Mcnc: 3 g/dL (ref 2.9–4.4)
Albumin/Glob SerPl: 1 (ref 0.7–1.7)
Alpha 1: 0.2 g/dL (ref 0.0–0.4)
Alpha2 Glob SerPl Elph-Mcnc: 0.8 g/dL (ref 0.4–1.0)
B-Globulin SerPl Elph-Mcnc: 1.2 g/dL (ref 0.7–1.3)
Gamma Glob SerPl Elph-Mcnc: 0.9 g/dL (ref 0.4–1.8)
Globulin, Total: 3.2 g/dL (ref 2.2–3.9)
IgA: 138 mg/dL (ref 87–352)
IgG (Immunoglobin G), Serum: 910 mg/dL (ref 700–1600)
IgM (Immunoglobulin M), Srm: 49 mg/dL (ref 26–217)
M Protein SerPl Elph-Mcnc: 0.2 g/dL — ABNORMAL HIGH
Total Protein ELP: 6.2 g/dL (ref 6.0–8.5)

## 2017-09-22 DIAGNOSIS — D472 Monoclonal gammopathy: Secondary | ICD-10-CM | POA: Diagnosis not present

## 2017-09-22 DIAGNOSIS — I129 Hypertensive chronic kidney disease with stage 1 through stage 4 chronic kidney disease, or unspecified chronic kidney disease: Secondary | ICD-10-CM | POA: Diagnosis not present

## 2017-09-22 DIAGNOSIS — R809 Proteinuria, unspecified: Secondary | ICD-10-CM | POA: Diagnosis not present

## 2017-09-22 DIAGNOSIS — N189 Chronic kidney disease, unspecified: Secondary | ICD-10-CM | POA: Diagnosis not present

## 2017-09-22 DIAGNOSIS — D649 Anemia, unspecified: Secondary | ICD-10-CM | POA: Diagnosis not present

## 2017-09-22 DIAGNOSIS — D509 Iron deficiency anemia, unspecified: Secondary | ICD-10-CM | POA: Diagnosis not present

## 2017-09-24 ENCOUNTER — Other Ambulatory Visit: Payer: Self-pay

## 2017-09-24 ENCOUNTER — Inpatient Hospital Stay: Payer: Medicare Other

## 2017-09-24 ENCOUNTER — Inpatient Hospital Stay (HOSPITAL_BASED_OUTPATIENT_CLINIC_OR_DEPARTMENT_OTHER): Payer: Medicare Other | Admitting: Hematology and Oncology

## 2017-09-24 VITALS — BP 169/87 | HR 80 | Temp 98.4°F | Wt 164.2 lb

## 2017-09-24 DIAGNOSIS — E785 Hyperlipidemia, unspecified: Secondary | ICD-10-CM | POA: Diagnosis not present

## 2017-09-24 DIAGNOSIS — D649 Anemia, unspecified: Secondary | ICD-10-CM | POA: Diagnosis not present

## 2017-09-24 DIAGNOSIS — N281 Cyst of kidney, acquired: Secondary | ICD-10-CM | POA: Diagnosis not present

## 2017-09-24 DIAGNOSIS — D472 Monoclonal gammopathy: Secondary | ICD-10-CM | POA: Diagnosis not present

## 2017-09-24 DIAGNOSIS — M25511 Pain in right shoulder: Secondary | ICD-10-CM | POA: Diagnosis not present

## 2017-09-24 DIAGNOSIS — Z79899 Other long term (current) drug therapy: Secondary | ICD-10-CM

## 2017-09-24 DIAGNOSIS — N189 Chronic kidney disease, unspecified: Secondary | ICD-10-CM

## 2017-09-24 DIAGNOSIS — Z880 Allergy status to penicillin: Secondary | ICD-10-CM

## 2017-09-24 DIAGNOSIS — D509 Iron deficiency anemia, unspecified: Secondary | ICD-10-CM | POA: Diagnosis not present

## 2017-09-24 DIAGNOSIS — I129 Hypertensive chronic kidney disease with stage 1 through stage 4 chronic kidney disease, or unspecified chronic kidney disease: Secondary | ICD-10-CM | POA: Diagnosis not present

## 2017-09-24 DIAGNOSIS — N184 Chronic kidney disease, stage 4 (severe): Secondary | ICD-10-CM | POA: Diagnosis not present

## 2017-09-24 DIAGNOSIS — M170 Bilateral primary osteoarthritis of knee: Secondary | ICD-10-CM

## 2017-09-24 DIAGNOSIS — Z8673 Personal history of transient ischemic attack (TIA), and cerebral infarction without residual deficits: Secondary | ICD-10-CM

## 2017-09-24 DIAGNOSIS — Z87891 Personal history of nicotine dependence: Secondary | ICD-10-CM

## 2017-09-24 DIAGNOSIS — R809 Proteinuria, unspecified: Secondary | ICD-10-CM | POA: Diagnosis not present

## 2017-09-24 NOTE — Progress Notes (Signed)
No treatment today per Aaron Edelman, NP, patient will return later in the week for iron.
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
## 2017-09-26 LAB — UIFE/LIGHT CHAINS/TP QN, 24-HR UR
% BETA, Urine: 13.4 %
ALBUMIN, U: 62.4 %
ALPHA 1 URINE: 4.9 %
Alpha 2, Urine: 8.8 %
FREE KAPPA/LAMBDA RATIO: 19.29 — AB (ref 2.04–10.37)
FREE LT CHN EXCR RATE: 328 mg/L — AB (ref 1.35–24.19)
Free Lambda Lt Chains,Ur: 17 mg/L — ABNORMAL HIGH (ref 0.24–6.66)
GAMMA GLOBULIN URINE: 10.5 %
M-SPIKE %, URINE: 2.2 % — AB
M-SPIKE, MG/24 HR: 68 mg/(24.h) — AB
Total Protein, Urine-Ur/day: 3092 mg/24 hr — ABNORMAL HIGH (ref 30–150)
Total Protein, Urine: 199.5 mg/dL
Total Volume: 1550

## 2017-09-28 ENCOUNTER — Inpatient Hospital Stay: Payer: Medicare Other

## 2017-09-28 VITALS — BP 146/76 | HR 70 | Temp 97.9°F | Resp 20

## 2017-09-28 DIAGNOSIS — D509 Iron deficiency anemia, unspecified: Secondary | ICD-10-CM | POA: Diagnosis not present

## 2017-09-28 DIAGNOSIS — I129 Hypertensive chronic kidney disease with stage 1 through stage 4 chronic kidney disease, or unspecified chronic kidney disease: Secondary | ICD-10-CM | POA: Diagnosis not present

## 2017-09-28 DIAGNOSIS — R809 Proteinuria, unspecified: Secondary | ICD-10-CM | POA: Diagnosis not present

## 2017-09-28 DIAGNOSIS — N189 Chronic kidney disease, unspecified: Secondary | ICD-10-CM | POA: Diagnosis not present

## 2017-09-28 DIAGNOSIS — D649 Anemia, unspecified: Secondary | ICD-10-CM | POA: Diagnosis not present

## 2017-09-28 DIAGNOSIS — D472 Monoclonal gammopathy: Secondary | ICD-10-CM | POA: Diagnosis not present

## 2017-09-28 MED ORDER — SODIUM CHLORIDE 0.9 % IV SOLN
Freq: Once | INTRAVENOUS | Status: AC
  Administered 2017-09-28: 09:00:00 via INTRAVENOUS
  Filled 2017-09-28: qty 1000

## 2017-09-28 MED ORDER — IRON SUCROSE 20 MG/ML IV SOLN
200.0000 mg | Freq: Once | INTRAVENOUS | Status: AC
  Administered 2017-09-28: 200 mg via INTRAVENOUS
  Filled 2017-09-28: qty 10

## 2017-10-01 ENCOUNTER — Telehealth: Payer: Self-pay | Admitting: *Deleted

## 2017-10-01 ENCOUNTER — Inpatient Hospital Stay: Payer: Medicare Other

## 2017-10-01 DIAGNOSIS — D649 Anemia, unspecified: Secondary | ICD-10-CM

## 2017-10-01 DIAGNOSIS — I129 Hypertensive chronic kidney disease with stage 1 through stage 4 chronic kidney disease, or unspecified chronic kidney disease: Secondary | ICD-10-CM | POA: Diagnosis not present

## 2017-10-01 DIAGNOSIS — D472 Monoclonal gammopathy: Secondary | ICD-10-CM | POA: Diagnosis not present

## 2017-10-01 DIAGNOSIS — N189 Chronic kidney disease, unspecified: Secondary | ICD-10-CM | POA: Diagnosis not present

## 2017-10-01 DIAGNOSIS — R809 Proteinuria, unspecified: Secondary | ICD-10-CM | POA: Diagnosis not present

## 2017-10-01 DIAGNOSIS — D509 Iron deficiency anemia, unspecified: Secondary | ICD-10-CM | POA: Diagnosis not present

## 2017-10-01 LAB — BASIC METABOLIC PANEL
Anion gap: 10 (ref 5–15)
BUN: 45 mg/dL — ABNORMAL HIGH (ref 6–20)
CO2: 20 mmol/L — ABNORMAL LOW (ref 22–32)
Calcium: 9.4 mg/dL (ref 8.9–10.3)
Chloride: 110 mmol/L (ref 101–111)
Creatinine, Ser: 3.18 mg/dL — ABNORMAL HIGH (ref 0.44–1.00)
GFR calc Af Amer: 16 mL/min — ABNORMAL LOW (ref >60)
GFR calc non Af Amer: 14 mL/min — ABNORMAL LOW (ref >60)
Glucose, Bld: 94 mg/dL (ref 65–99)
Potassium: 4.6 mmol/L (ref 3.5–5.1)
Sodium: 140 mmol/L (ref 135–145)

## 2017-10-01 LAB — CBC WITH DIFFERENTIAL/PLATELET
Basophils Absolute: 0.1 10*3/uL (ref 0–0.1)
Basophils Relative: 1 %
Eosinophils Absolute: 0.1 10*3/uL (ref 0–0.7)
Eosinophils Relative: 3 %
HCT: 25.1 % — ABNORMAL LOW (ref 35.0–47.0)
Hemoglobin: 8.1 g/dL — ABNORMAL LOW (ref 12.0–16.0)
Lymphocytes Relative: 21 %
Lymphs Abs: 1.2 10*3/uL (ref 1.0–3.6)
MCH: 26.7 pg (ref 26.0–34.0)
MCHC: 32.4 g/dL (ref 32.0–36.0)
MCV: 82.2 fL (ref 80.0–100.0)
Monocytes Absolute: 0.4 10*3/uL (ref 0.2–0.9)
Monocytes Relative: 6 %
Neutro Abs: 4 10*3/uL (ref 1.4–6.5)
Neutrophils Relative %: 69 %
Platelets: 326 10*3/uL (ref 150–440)
RBC: 3.05 MIL/uL — ABNORMAL LOW (ref 3.80–5.20)
RDW: 18.1 % — ABNORMAL HIGH (ref 11.5–14.5)
WBC: 5.8 10*3/uL (ref 3.6–11.0)

## 2017-10-01 NOTE — Telephone Encounter (Signed)
-----   Message from Lequita Asal, MD sent at 10/01/2017  4:42 PM EDT ----- Regarding: Please notify patient of today's labs  Creatinine up.  Hematocrit down.  M  ----- Message ----- From: Interface, Lab In [place] Sent: 10/01/2017   1:23 PM To: Lequita Asal, MD

## 2017-10-01 NOTE — Telephone Encounter (Signed)
Called patient to give her lab results regarding Hct and Creatnine.  Advised patient to increase water intake.

## 2017-10-05 ENCOUNTER — Inpatient Hospital Stay: Payer: Medicare Other | Attending: Hematology and Oncology

## 2017-10-05 VITALS — BP 171/69 | HR 81 | Temp 97.1°F | Resp 18

## 2017-10-05 DIAGNOSIS — Z880 Allergy status to penicillin: Secondary | ICD-10-CM | POA: Insufficient documentation

## 2017-10-05 DIAGNOSIS — M25511 Pain in right shoulder: Secondary | ICD-10-CM | POA: Insufficient documentation

## 2017-10-05 DIAGNOSIS — M25569 Pain in unspecified knee: Secondary | ICD-10-CM | POA: Insufficient documentation

## 2017-10-05 DIAGNOSIS — D649 Anemia, unspecified: Secondary | ICD-10-CM

## 2017-10-05 DIAGNOSIS — M109 Gout, unspecified: Secondary | ICD-10-CM | POA: Insufficient documentation

## 2017-10-05 DIAGNOSIS — Z79899 Other long term (current) drug therapy: Secondary | ICD-10-CM | POA: Insufficient documentation

## 2017-10-05 DIAGNOSIS — M1990 Unspecified osteoarthritis, unspecified site: Secondary | ICD-10-CM | POA: Insufficient documentation

## 2017-10-05 DIAGNOSIS — Z87891 Personal history of nicotine dependence: Secondary | ICD-10-CM | POA: Insufficient documentation

## 2017-10-05 DIAGNOSIS — D472 Monoclonal gammopathy: Secondary | ICD-10-CM | POA: Diagnosis not present

## 2017-10-05 DIAGNOSIS — D509 Iron deficiency anemia, unspecified: Secondary | ICD-10-CM | POA: Insufficient documentation

## 2017-10-05 DIAGNOSIS — Z8673 Personal history of transient ischemic attack (TIA), and cerebral infarction without residual deficits: Secondary | ICD-10-CM | POA: Insufficient documentation

## 2017-10-05 DIAGNOSIS — N189 Chronic kidney disease, unspecified: Secondary | ICD-10-CM | POA: Insufficient documentation

## 2017-10-05 DIAGNOSIS — E785 Hyperlipidemia, unspecified: Secondary | ICD-10-CM | POA: Insufficient documentation

## 2017-10-05 DIAGNOSIS — I129 Hypertensive chronic kidney disease with stage 1 through stage 4 chronic kidney disease, or unspecified chronic kidney disease: Secondary | ICD-10-CM | POA: Insufficient documentation

## 2017-10-05 DIAGNOSIS — N281 Cyst of kidney, acquired: Secondary | ICD-10-CM | POA: Insufficient documentation

## 2017-10-05 MED ORDER — IRON SUCROSE 20 MG/ML IV SOLN
200.0000 mg | Freq: Once | INTRAVENOUS | Status: AC
  Administered 2017-10-05: 200 mg via INTRAVENOUS
  Filled 2017-10-05: qty 10

## 2017-10-05 MED ORDER — SODIUM CHLORIDE 0.9 % IV SOLN
Freq: Once | INTRAVENOUS | Status: AC
  Administered 2017-10-05: 14:00:00 via INTRAVENOUS
  Filled 2017-10-05: qty 1000

## 2017-10-06 ENCOUNTER — Encounter: Payer: Self-pay | Admitting: Hematology and Oncology

## 2017-10-07 ENCOUNTER — Encounter: Payer: Self-pay | Admitting: Hematology and Oncology

## 2017-10-12 ENCOUNTER — Inpatient Hospital Stay: Payer: Medicare Other

## 2017-10-12 VITALS — BP 167/78 | HR 73 | Temp 96.9°F | Resp 18

## 2017-10-12 DIAGNOSIS — D649 Anemia, unspecified: Secondary | ICD-10-CM

## 2017-10-12 DIAGNOSIS — D509 Iron deficiency anemia, unspecified: Secondary | ICD-10-CM | POA: Diagnosis not present

## 2017-10-12 DIAGNOSIS — D472 Monoclonal gammopathy: Secondary | ICD-10-CM | POA: Diagnosis not present

## 2017-10-12 MED ORDER — SODIUM CHLORIDE 0.9 % IV SOLN
Freq: Once | INTRAVENOUS | Status: AC
Start: 2017-10-12 — End: 2017-10-12
  Administered 2017-10-12: 14:00:00 via INTRAVENOUS
  Filled 2017-10-12: qty 1000

## 2017-10-12 MED ORDER — IRON SUCROSE 20 MG/ML IV SOLN
200.0000 mg | Freq: Once | INTRAVENOUS | Status: AC
  Administered 2017-10-12: 200 mg via INTRAVENOUS
  Filled 2017-10-12: qty 10

## 2017-10-17 ENCOUNTER — Other Ambulatory Visit: Payer: Self-pay | Admitting: Urgent Care

## 2017-10-17 DIAGNOSIS — N184 Chronic kidney disease, stage 4 (severe): Secondary | ICD-10-CM | POA: Diagnosis not present

## 2017-10-17 DIAGNOSIS — I129 Hypertensive chronic kidney disease with stage 1 through stage 4 chronic kidney disease, or unspecified chronic kidney disease: Secondary | ICD-10-CM | POA: Diagnosis not present

## 2017-10-17 DIAGNOSIS — I639 Cerebral infarction, unspecified: Secondary | ICD-10-CM | POA: Diagnosis not present

## 2017-10-17 DIAGNOSIS — D472 Monoclonal gammopathy: Secondary | ICD-10-CM

## 2017-10-17 NOTE — Progress Notes (Signed)
Orders only encounter

## 2017-10-29 ENCOUNTER — Ambulatory Visit: Payer: Medicare Other

## 2017-11-02 ENCOUNTER — Other Ambulatory Visit: Payer: Self-pay

## 2017-11-02 ENCOUNTER — Inpatient Hospital Stay: Payer: Medicare Other

## 2017-11-02 ENCOUNTER — Inpatient Hospital Stay (HOSPITAL_BASED_OUTPATIENT_CLINIC_OR_DEPARTMENT_OTHER): Payer: Medicare Other | Admitting: Hematology and Oncology

## 2017-11-02 VITALS — BP 170/90 | HR 80 | Temp 97.5°F | Resp 18 | Wt 163.1 lb

## 2017-11-02 DIAGNOSIS — E785 Hyperlipidemia, unspecified: Secondary | ICD-10-CM

## 2017-11-02 DIAGNOSIS — M25511 Pain in right shoulder: Secondary | ICD-10-CM

## 2017-11-02 DIAGNOSIS — D472 Monoclonal gammopathy: Secondary | ICD-10-CM

## 2017-11-02 DIAGNOSIS — Z87891 Personal history of nicotine dependence: Secondary | ICD-10-CM

## 2017-11-02 DIAGNOSIS — Z880 Allergy status to penicillin: Secondary | ICD-10-CM | POA: Diagnosis not present

## 2017-11-02 DIAGNOSIS — M25569 Pain in unspecified knee: Secondary | ICD-10-CM

## 2017-11-02 DIAGNOSIS — D509 Iron deficiency anemia, unspecified: Secondary | ICD-10-CM

## 2017-11-02 DIAGNOSIS — M109 Gout, unspecified: Secondary | ICD-10-CM

## 2017-11-02 DIAGNOSIS — M1990 Unspecified osteoarthritis, unspecified site: Secondary | ICD-10-CM

## 2017-11-02 DIAGNOSIS — Z79899 Other long term (current) drug therapy: Secondary | ICD-10-CM

## 2017-11-02 DIAGNOSIS — I129 Hypertensive chronic kidney disease with stage 1 through stage 4 chronic kidney disease, or unspecified chronic kidney disease: Secondary | ICD-10-CM

## 2017-11-02 DIAGNOSIS — N821 Other female urinary-genital tract fistulae: Secondary | ICD-10-CM | POA: Diagnosis not present

## 2017-11-02 DIAGNOSIS — N189 Chronic kidney disease, unspecified: Secondary | ICD-10-CM | POA: Diagnosis not present

## 2017-11-02 DIAGNOSIS — Z8673 Personal history of transient ischemic attack (TIA), and cerebral infarction without residual deficits: Secondary | ICD-10-CM

## 2017-11-02 LAB — CBC WITH DIFFERENTIAL/PLATELET
Basophils Absolute: 0.1 10*3/uL (ref 0–0.1)
Basophils Relative: 1 %
Eosinophils Absolute: 0.1 10*3/uL (ref 0–0.7)
Eosinophils Relative: 3 %
HCT: 25.3 % — ABNORMAL LOW (ref 35.0–47.0)
Hemoglobin: 8.2 g/dL — ABNORMAL LOW (ref 12.0–16.0)
Lymphocytes Relative: 22 %
Lymphs Abs: 1.2 10*3/uL (ref 1.0–3.6)
MCH: 26.8 pg (ref 26.0–34.0)
MCHC: 32.4 g/dL (ref 32.0–36.0)
MCV: 82.8 fL (ref 80.0–100.0)
Monocytes Absolute: 0.3 10*3/uL (ref 0.2–0.9)
Monocytes Relative: 6 %
Neutro Abs: 3.7 10*3/uL (ref 1.4–6.5)
Neutrophils Relative %: 68 %
Platelets: 322 10*3/uL (ref 150–440)
RBC: 3.06 MIL/uL — ABNORMAL LOW (ref 3.80–5.20)
RDW: 18.9 % — ABNORMAL HIGH (ref 11.5–14.5)
WBC: 5.4 10*3/uL (ref 3.6–11.0)

## 2017-11-02 LAB — COMPREHENSIVE METABOLIC PANEL
ALT: 15 U/L (ref 14–54)
AST: 20 U/L (ref 15–41)
Albumin: 3.4 g/dL — ABNORMAL LOW (ref 3.5–5.0)
Alkaline Phosphatase: 49 U/L (ref 38–126)
Anion gap: 11 (ref 5–15)
BUN: 62 mg/dL — ABNORMAL HIGH (ref 6–20)
CO2: 17 mmol/L — ABNORMAL LOW (ref 22–32)
Calcium: 9.4 mg/dL (ref 8.9–10.3)
Chloride: 111 mmol/L (ref 101–111)
Creatinine, Ser: 3.71 mg/dL — ABNORMAL HIGH (ref 0.44–1.00)
GFR calc Af Amer: 13 mL/min — ABNORMAL LOW (ref >60)
GFR calc non Af Amer: 11 mL/min — ABNORMAL LOW (ref >60)
Glucose, Bld: 87 mg/dL (ref 65–99)
Potassium: 4.4 mmol/L (ref 3.5–5.1)
Sodium: 139 mmol/L (ref 135–145)
Total Bilirubin: 0.5 mg/dL (ref 0.3–1.2)
Total Protein: 6.7 g/dL (ref 6.5–8.1)

## 2017-11-02 LAB — FERRITIN: Ferritin: 191 ng/mL (ref 11–307)
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
## 2017-11-02 NOTE — Progress Notes (Signed)
Pt here for follow up. BP- 170/90 . Pt unclear about antihypertensive medications. Unclear about taking HCTZ.  -educated re taking this NOT at night -as she " thought "she took it -then said didn't and awake often urinating. Will repeat BP. Pt encouraged to bring meds with her for accurate reconciliation.  In NAD. Repeat BP @ 220 pm- 165/81 -reported to Doran Durand NP. Pt in NAD

## 2017-11-07 ENCOUNTER — Other Ambulatory Visit: Payer: Self-pay | Admitting: Student

## 2017-11-07 ENCOUNTER — Other Ambulatory Visit: Payer: Self-pay | Admitting: Radiology

## 2017-11-09 ENCOUNTER — Ambulatory Visit
Admission: RE | Admit: 2017-11-09 | Discharge: 2017-11-09 | Disposition: A | Discharge status: Home / Self Care | Payer: Medicare Other | Source: Ambulatory Visit | Attending: Urgent Care | Admitting: Urgent Care

## 2017-11-09 ENCOUNTER — Other Ambulatory Visit (HOSPITAL_COMMUNITY)
Admission: RE | Admit: 2017-11-09 | Disposition: A | Discharge status: Home / Self Care | Payer: Medicare Other | Source: Ambulatory Visit | Attending: Hematology and Oncology | Admitting: Hematology and Oncology

## 2017-11-09 ENCOUNTER — Ambulatory Visit: Payer: Medicare Other

## 2017-11-09 DIAGNOSIS — D72822 Plasmacytosis: Secondary | ICD-10-CM | POA: Diagnosis not present

## 2017-11-09 DIAGNOSIS — Z87891 Personal history of nicotine dependence: Secondary | ICD-10-CM | POA: Diagnosis not present

## 2017-11-09 DIAGNOSIS — C9000 Multiple myeloma not having achieved remission: Secondary | ICD-10-CM | POA: Diagnosis not present

## 2017-11-09 DIAGNOSIS — Z880 Allergy status to penicillin: Secondary | ICD-10-CM | POA: Insufficient documentation

## 2017-11-09 DIAGNOSIS — D472 Monoclonal gammopathy: Secondary | ICD-10-CM | POA: Diagnosis not present

## 2017-11-09 DIAGNOSIS — Z8673 Personal history of transient ischemic attack (TIA), and cerebral infarction without residual deficits: Secondary | ICD-10-CM | POA: Insufficient documentation

## 2017-11-09 DIAGNOSIS — Z79899 Other long term (current) drug therapy: Secondary | ICD-10-CM | POA: Insufficient documentation

## 2017-11-09 DIAGNOSIS — I10 Essential (primary) hypertension: Secondary | ICD-10-CM | POA: Insufficient documentation

## 2017-11-09 DIAGNOSIS — M109 Gout, unspecified: Secondary | ICD-10-CM | POA: Diagnosis not present

## 2017-11-09 DIAGNOSIS — E785 Hyperlipidemia, unspecified: Secondary | ICD-10-CM | POA: Insufficient documentation

## 2017-11-09 LAB — CBC WITH DIFFERENTIAL/PLATELET
BASOS ABS: 0.1 10*3/uL (ref 0–0.1)
BASOS PCT: 2 %
EOS PCT: 3 %
Eosinophils Absolute: 0.1 10*3/uL (ref 0–0.7)
HCT: 26.9 % — ABNORMAL LOW (ref 35.0–47.0)
Hemoglobin: 8.6 g/dL — ABNORMAL LOW (ref 12.0–16.0)
LYMPHS PCT: 24 %
Lymphs Abs: 1.1 10*3/uL (ref 1.0–3.6)
MCH: 26.3 pg (ref 26.0–34.0)
MCHC: 31.8 g/dL — ABNORMAL LOW (ref 32.0–36.0)
MCV: 82.8 fL (ref 80.0–100.0)
Monocytes Absolute: 0.4 10*3/uL (ref 0.2–0.9)
Monocytes Relative: 9 %
NEUTROS ABS: 2.7 10*3/uL (ref 1.4–6.5)
Neutrophils Relative %: 62 %
PLATELETS: 296 10*3/uL (ref 150–440)
RBC: 3.25 MIL/uL — AB (ref 3.80–5.20)
RDW: 19.6 % — AB (ref 11.5–14.5)
WBC: 4.4 10*3/uL (ref 3.6–11.0)

## 2017-11-09 LAB — PROTIME-INR
INR: 1.04
PROTHROMBIN TIME: 13.5 s (ref 11.4–15.2)

## 2017-11-09 LAB — APTT: aPTT: 38 seconds — ABNORMAL HIGH (ref 24–36)

## 2017-11-09 MED ORDER — HEPARIN SOD (PORK) LOCK FLUSH 100 UNIT/ML IV SOLN
INTRAVENOUS | Status: AC
  Filled 2017-11-09: qty 5

## 2017-11-09 MED ORDER — SODIUM CHLORIDE 0.9 % IV SOLN
INTRAVENOUS | Status: DC
  Administered 2017-11-09: 08:00:00 via INTRAVENOUS

## 2017-11-09 MED ORDER — FENTANYL CITRATE (PF) 100 MCG/2ML IJ SOLN
INTRAMUSCULAR | Status: AC
  Filled 2017-11-09: qty 2

## 2017-11-09 MED ORDER — FENTANYL CITRATE (PF) 100 MCG/2ML IJ SOLN
INTRAMUSCULAR | Status: AC | PRN
  Administered 2017-11-09 (×2): 50 ug via INTRAVENOUS

## 2017-11-09 MED ORDER — MIDAZOLAM HCL 2 MG/2ML IJ SOLN
INTRAMUSCULAR | Status: AC
  Filled 2017-11-09: qty 4

## 2017-11-09 MED ORDER — MIDAZOLAM HCL 2 MG/2ML IJ SOLN
INTRAMUSCULAR | Status: AC | PRN
  Administered 2017-11-09 (×2): 1 mg via INTRAVENOUS

## 2017-11-09 MED ORDER — LIDOCAINE HCL 1 % IJ SOLN
INTRAMUSCULAR | Status: AC | PRN
  Administered 2017-11-09: 8 mL via INTRADERMAL

## 2017-11-09 NOTE — H&P (Signed)
Chief Complaint:  myeloma workup  Referring Physician(s): Khan,Fozia M    History of Present Illness: Kristen Francis is a 71 y.o. female with multiple myeloma. Here for CT bone marrow today.  No complaints  Past Medical History:  Diagnosis Date  . Gout   . Hyperlipemia   . Hypertension   . Stroke [facility])     Past Surgical History:  Procedure Laterality Date  . ABDOMINAL HYSTERECTOMY    . APPENDECTOMY    . COLONOSCOPY WITH PROPOFOL N/A 01/14/2016   Procedure: COLONOSCOPY WITH PROPOFOL;  Surgeon: Manya Silvas, MD;  Location: [facility] ENDOSCOPY;  Service: Endoscopy;  Laterality: N/A;    Allergies: Penicillins  Medications: Prior to Admission medications   Medication Sig Start Date End Date Taking? Authorizing Provider  allopurinol (ZYLOPRIM) 100 MG tablet Take 100 mg by mouth daily.   Yes [provider]  amLODipine (NORVASC) 10 MG tablet Take 10 mg by mouth daily.   Yes [provider]  carvedilol (COREG) 6.25 MG tablet Take 6.25 mg by mouth 2 (two) times daily with a meal.   Yes [provider]  cholecalciferol (VITAMIN D) 1000 units tablet Take 1,000 Units by mouth daily.   Yes [provider]  ferrous sulfate 325 (65 FE) MG tablet Take 325 mg by mouth.   Yes [provider]  losartan (COZAAR) 25 MG tablet Take 25 mg by mouth daily.   Yes [provider]  hydrochlorothiazide (HYDRODIURIL) 25 MG tablet Take by mouth.    [provider]     Family History  Problem Relation Age of Onset  . Cancer Mother     Social History   Socioeconomic History  . Marital status: Married    Spouse name: Not on file  . Number of children: Not on file  . Years of education: Not on file  . Highest education level: Not on file  Social Needs  . Financial resource strain: Not on file  . Food insecurity - worry: Not on file  . Food insecurity - inability: Not on file  . Transportation needs - medical: Not on file  .  Transportation needs - non-medical: Not on file  Occupational History  . Not on file  Tobacco Use  . Smoking status: Former Research scientist (life sciences)  . Smokeless tobacco: Never Used  Substance and Sexual Activity  . Alcohol use: Not on file  . Drug use: Not on file  . Sexual activity: Not on file  Other Topics Concern  . Not on file  Social History Narrative  . Not on file      Review of Systems: A 12 point ROS discussed and pertinent positives are indicated in the HPI above.  All other systems are negative.  Review of Systems  Vital Signs: BP (!) 162/86   Pulse 68   Temp 97.9 F (36.6 C)   Resp 17   SpO2 100%   Physical Exam  Constitutional: She is oriented to person, place, and time. She appears well-developed and well-nourished. No distress.  Eyes: Conjunctivae are normal. No scleral icterus.  Cardiovascular: Normal rate, regular rhythm and normal heart sounds.  Pulmonary/Chest: Effort normal and breath sounds normal.  Abdominal: Soft. Bowel sounds are normal.  Musculoskeletal: Normal range of motion.  Neurological: She is alert and oriented to person, place, and time.  Skin: Skin is warm and dry. She is not diaphoretic. No erythema.    Imaging: No results found.  Labs:  CBC: Recent Labs  09/17/17 1511 10/01/17 1315 11/02/17 1316 11/09/17 0730  WBC 5.1 5.8 5.4 4.4  HGB 8.4* 8.1* 8.2* 8.6*  HCT 25.5* 25.1* 25.3* 26.9*  PLT 379 326 322 296    COAGS: Recent Labs    11/09/17 0730  INR 1.04  APTT 38*    BMP: Recent Labs    10/01/17 1315 11/02/17 1316  NA 140 139  K 4.6 4.4  CL 110 111  CO2 20* 17*  GLUCOSE 94 87  BUN 45* 62*  CALCIUM 9.4 9.4  CREATININE 3.18* 3.71*  GFRNONAA 14* 11*  GFRAA 16* 13*    LIVER FUNCTION TESTS: Recent Labs    11/02/17 1316  BILITOT 0.5  AST 20  ALT 15  ALKPHOS 49  PROT 6.7  ALBUMIN 3.4*    TUMOR MARKERS: No results for input(s): AFPTM, CEA, CA199, CHROMGRNA in the last 8760 hours.  Assessment and Plan:  Plan  for CT BM asp and core bx today for myeloma workup.  Risks and benefits discussed with the patient including, but not limited to bleeding, infection, damage to adjacent structures or low yield requiring additional tests. All of the patient's questions were answered, patient is agreeable to proceed. Consent signed and in chart.    Thank you for this interesting consult.  I greatly enjoyed meeting Kristen Francis and look forward to participating in their care.  A copy of this report was sent to the requesting provider on this date.  Electronically Signed: Greggory Keen, MD 11/09/2017, 8:48 AM   I spent a total of  15 Minutes   in face to face in clinical consultation, greater than 50% of which was counseling/coordinating care for this patient with myeloma

## 2017-11-09 NOTE — Discharge Instructions (Signed)
Moderate Conscious Sedation, Adult, Care After °These instructions provide you with information about caring for yourself after your procedure. Your health care provider may also give you more specific instructions. Your treatment has been planned according to current medical practices, but problems sometimes occur. Call your health care provider if you have any problems or questions after your procedure. °What can I expect after the procedure? °After your procedure, it is common: °To feel sleepy for several hours. °To feel clumsy and have poor balance for several hours. °To have poor judgment for several hours. °To vomit if you eat too soon. ° °Follow these instructions at home: °For at least 24 hours after the procedure: ° °Do not: °Participate in activities where you could fall or become injured. °Drive. °Use heavy machinery. °Drink alcohol. °Take sleeping pills or medicines that cause drowsiness. °Make important decisions or sign legal documents. °Take care of children on your own. °Rest. °Eating and drinking °Follow the diet recommended by your health care provider. °If you vomit: °Drink water, juice, or soup when you can drink without vomiting. °Make sure you have little or no nausea before eating solid foods. °General instructions °Have a responsible adult stay with you until you are awake and alert. °Take over-the-counter and prescription medicines only as told by your health care provider. °If you smoke, do not smoke without supervision. °Keep all follow-up visits as told by your health care provider. This is important. °Contact a health care provider if: °You keep feeling nauseous or you keep vomiting. °You feel light-headed. °You develop a rash. °You have a fever. °Get help right away if: °You have trouble breathing. °This information is not intended to replace advice given to you by your health care provider. Make sure you discuss any questions you have with your health care provider. °Document Released:  09/10/2013 Document Revised: 04/24/2016 Document Reviewed: 03/11/2016 °Elsevier Interactive Patient Education © 2018 [employer] °Needle Biopsy of the Bone °A bone biopsy is a procedure in which a small sample of bone is removed. The sample is taken with a needle. Then, the bone sample is looked at under a microscope to check for abnormalities. The sample is usually taken from a bone that is close to the skin. This procedure may be done to check for various problems with the bone. You may need this procedure if imaging tests or blood tests have indicated a possible problem. This procedure may be done to help determine if a bone tumor is cancerous (malignant). A bone biopsy can help to diagnose problems such as: °· Tumors of the bone (sarcomas) and bone marrow (multiple myeloma). °· Bone that forms abnormally (Paget disease). °· Noncancerous (benign) bone cysts. °· Bony growths. °· Infections in the bone. ° °Tell a health care provider about: °· Any allergies you have. °· All medicines you are taking, including vitamins, herbs, eye drops, creams, and over-the-counter medicines. °· Any problems you or family members have had with anesthetic medicines. °· Any blood disorders you have. °· Any surgeries you have had. °· Any medical conditions you have. °What are the risks? °Generally, this is a safe procedure. However, problems may occur, including: °· Excessive bleeding. °· Infection. °· Injury to surrounding tissue. ° °What happens before the procedure? °· Ask your health care provider about: °? Changing or stopping your regular medicines. This is especially important if you are taking diabetes medicines or blood thinners. °? Taking medicines such as aspirin and ibuprofen. These medicines can thin your blood. Do not take these   medicines before your procedure if your health care provider instructs you not to. °· Follow instructions from your health care provider about eating or drinking restrictions. °· Plan to have  someone take you home after the procedure. °· If you go home right after the procedure, plan to have someone with you for 24 hours. °What happens during the procedure? °· An IV tube may be inserted into one of your veins. °· The injection site will be cleaned with a germ-killing solution (antiseptic). °· You will be given one or more of the following: °? A medicine to help you relax (sedative). °? A medicine to numb the area (local anesthetic). °· The sample of bone will be removed by putting a large needle through the skin and into the bone. °· The needle will be removed. °· A bandage (dressing) will be placed over the insertion site and taped in place. °The procedure may vary among health care providers and hospitals. °What happens after the procedure? °· Your blood pressure, heart rate, breathing rate, and blood oxygen level will be monitored often until the medicines you were given have worn off. °· Return to your normal activities as told by your health care provider. °This information is not intended to replace advice given to you by your health care provider. Make sure you discuss any questions you have with your health care provider. °Document Released: 09/28/2004 Document Revised: 04/27/2016 Document Reviewed: 12/28/2014 °Elsevier Interactive Patient Education © 2018 [employer] Inc[employer] ° °

## 2017-11-09 NOTE — Procedures (Signed)
Myeloma  S/P CT RT ILIAC BM ASP AND CORE  No comp Stable Path pending Full report in pacs

## 2017-11-19 ENCOUNTER — Encounter: Payer: Self-pay | Admitting: Hematology and Oncology

## 2017-11-30 ENCOUNTER — Other Ambulatory Visit: Payer: Self-pay | Admitting: Hematology and Oncology

## 2017-11-30 ENCOUNTER — Inpatient Hospital Stay: Payer: Medicare Other | Attending: Hematology and Oncology | Admitting: Hematology and Oncology

## 2017-11-30 ENCOUNTER — Inpatient Hospital Stay: Payer: Medicare Other

## 2017-11-30 ENCOUNTER — Encounter: Payer: Self-pay | Admitting: Hematology and Oncology

## 2017-11-30 ENCOUNTER — Inpatient Hospital Stay: Payer: Medicare Other | Admitting: Hematology and Oncology

## 2017-11-30 VITALS — BP 176/83 | HR 71 | Temp 97.7°F | Resp 16 | Wt 160.8 lb

## 2017-11-30 DIAGNOSIS — D509 Iron deficiency anemia, unspecified: Secondary | ICD-10-CM

## 2017-11-30 DIAGNOSIS — N189 Chronic kidney disease, unspecified: Secondary | ICD-10-CM | POA: Diagnosis not present

## 2017-11-30 DIAGNOSIS — Z90710 Acquired absence of both cervix and uterus: Secondary | ICD-10-CM

## 2017-11-30 DIAGNOSIS — D472 Monoclonal gammopathy: Secondary | ICD-10-CM | POA: Insufficient documentation

## 2017-11-30 DIAGNOSIS — Z880 Allergy status to penicillin: Secondary | ICD-10-CM | POA: Insufficient documentation

## 2017-11-30 DIAGNOSIS — Z79899 Other long term (current) drug therapy: Secondary | ICD-10-CM | POA: Diagnosis not present

## 2017-11-30 DIAGNOSIS — D649 Anemia, unspecified: Secondary | ICD-10-CM

## 2017-11-30 DIAGNOSIS — Z8673 Personal history of transient ischemic attack (TIA), and cerebral infarction without residual deficits: Secondary | ICD-10-CM

## 2017-11-30 DIAGNOSIS — Z87891 Personal history of nicotine dependence: Secondary | ICD-10-CM

## 2017-11-30 DIAGNOSIS — E785 Hyperlipidemia, unspecified: Secondary | ICD-10-CM | POA: Diagnosis not present

## 2017-11-30 DIAGNOSIS — I129 Hypertensive chronic kidney disease with stage 1 through stage 4 chronic kidney disease, or unspecified chronic kidney disease: Secondary | ICD-10-CM

## 2017-11-30 LAB — FERRITIN: Ferritin: 139 ng/mL (ref 11–307)

## 2017-11-30 LAB — CBC WITH DIFFERENTIAL/PLATELET
Basophils Absolute: 0.1 10*3/uL (ref 0–0.1)
Basophils Relative: 1 %
Eosinophils Absolute: 0.1 10*3/uL (ref 0–0.7)
Eosinophils Relative: 3 %
HCT: 27.5 % — ABNORMAL LOW (ref 35.0–47.0)
Hemoglobin: 9 g/dL — ABNORMAL LOW (ref 12.0–16.0)
Lymphocytes Relative: 23 %
Lymphs Abs: 1.3 10*3/uL (ref 1.0–3.6)
MCH: 27.3 pg (ref 26.0–34.0)
MCHC: 32.7 g/dL (ref 32.0–36.0)
MCV: 83.4 fL (ref 80.0–100.0)
Monocytes Absolute: 0.3 10*3/uL (ref 0.2–0.9)
Monocytes Relative: 5 %
Neutro Abs: 3.8 10*3/uL (ref 1.4–6.5)
Neutrophils Relative %: 68 %
Platelets: 364 10*3/uL (ref 150–440)
RBC: 3.3 MIL/uL — ABNORMAL LOW (ref 3.80–5.20)
RDW: 18.5 % — ABNORMAL HIGH (ref 11.5–14.5)
WBC: 5.5 10*3/uL (ref 3.6–11.0)

## 2017-11-30 LAB — RETICULOCYTES
RBC.: 3.38 MIL/uL — ABNORMAL LOW (ref 3.80–5.20)
Retic Count, Absolute: 57.5 10*3/uL (ref 19.0–183.0)
Retic Ct Pct: 1.7 % (ref 0.4–3.1)

## 2017-11-30 LAB — DAT, POLYSPECIFIC AHG (ARMC ONLY): Polyspecific AHG test: NEGATIVE
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
## 2017-12-01 LAB — HAPTOGLOBIN: Haptoglobin: 194 mg/dL (ref 34–200)

## 2017-12-02 ENCOUNTER — Encounter: Payer: Self-pay | Admitting: Hematology and Oncology

## 2017-12-06 ENCOUNTER — Encounter (HOSPITAL_COMMUNITY): Payer: Self-pay

## 2017-12-06 LAB — CHROMOSOME ANALYSIS, BONE MARROW

## 2017-12-28 ENCOUNTER — Other Ambulatory Visit: Payer: Self-pay | Admitting: *Deleted

## 2017-12-28 ENCOUNTER — Encounter: Payer: Self-pay | Admitting: Hematology and Oncology

## 2017-12-28 DIAGNOSIS — D472 Monoclonal gammopathy: Secondary | ICD-10-CM

## 2017-12-31 ENCOUNTER — Inpatient Hospital Stay (HOSPITAL_BASED_OUTPATIENT_CLINIC_OR_DEPARTMENT_OTHER): Payer: Medicare Other | Admitting: Hematology and Oncology

## 2017-12-31 ENCOUNTER — Other Ambulatory Visit: Payer: Self-pay

## 2017-12-31 ENCOUNTER — Encounter: Payer: Self-pay | Admitting: Hematology and Oncology

## 2017-12-31 ENCOUNTER — Inpatient Hospital Stay: Payer: Medicare Other | Attending: Hematology and Oncology

## 2017-12-31 VITALS — BP 174/74 | HR 77 | Temp 97.5°F | Wt 163.4 lb

## 2017-12-31 DIAGNOSIS — Z8673 Personal history of transient ischemic attack (TIA), and cerebral infarction without residual deficits: Secondary | ICD-10-CM | POA: Insufficient documentation

## 2017-12-31 DIAGNOSIS — Z87891 Personal history of nicotine dependence: Secondary | ICD-10-CM

## 2017-12-31 DIAGNOSIS — N184 Chronic kidney disease, stage 4 (severe): Secondary | ICD-10-CM

## 2017-12-31 DIAGNOSIS — D631 Anemia in chronic kidney disease: Secondary | ICD-10-CM | POA: Insufficient documentation

## 2017-12-31 DIAGNOSIS — Z79899 Other long term (current) drug therapy: Secondary | ICD-10-CM | POA: Diagnosis not present

## 2017-12-31 DIAGNOSIS — I639 Cerebral infarction, unspecified: Secondary | ICD-10-CM | POA: Insufficient documentation

## 2017-12-31 DIAGNOSIS — Z880 Allergy status to penicillin: Secondary | ICD-10-CM

## 2017-12-31 DIAGNOSIS — I129 Hypertensive chronic kidney disease with stage 1 through stage 4 chronic kidney disease, or unspecified chronic kidney disease: Secondary | ICD-10-CM | POA: Insufficient documentation

## 2017-12-31 DIAGNOSIS — M1990 Unspecified osteoarthritis, unspecified site: Secondary | ICD-10-CM | POA: Insufficient documentation

## 2017-12-31 DIAGNOSIS — E785 Hyperlipidemia, unspecified: Secondary | ICD-10-CM

## 2017-12-31 DIAGNOSIS — N189 Chronic kidney disease, unspecified: Secondary | ICD-10-CM | POA: Insufficient documentation

## 2017-12-31 DIAGNOSIS — I10 Essential (primary) hypertension: Secondary | ICD-10-CM | POA: Insufficient documentation

## 2017-12-31 DIAGNOSIS — D472 Monoclonal gammopathy: Secondary | ICD-10-CM

## 2017-12-31 LAB — CBC WITH DIFFERENTIAL/PLATELET
Basophils Absolute: 0.1 10*3/uL (ref 0–0.1)
Basophils Relative: 2 %
Eosinophils Absolute: 0.1 10*3/uL (ref 0–0.7)
Eosinophils Relative: 2 %
HCT: 25.2 % — ABNORMAL LOW (ref 35.0–47.0)
Hemoglobin: 8.2 g/dL — ABNORMAL LOW (ref 12.0–16.0)
Lymphocytes Relative: 23 %
Lymphs Abs: 1.3 10*3/uL (ref 1.0–3.6)
MCH: 27.5 pg (ref 26.0–34.0)
MCHC: 32.5 g/dL (ref 32.0–36.0)
MCV: 84.6 fL (ref 80.0–100.0)
Monocytes Absolute: 0.4 10*3/uL (ref 0.2–0.9)
Monocytes Relative: 8 %
Neutro Abs: 3.6 10*3/uL (ref 1.4–6.5)
Neutrophils Relative %: 65 %
Platelets: 321 10*3/uL (ref 150–440)
RBC: 2.98 MIL/uL — ABNORMAL LOW (ref 3.80–5.20)
RDW: 17.9 % — ABNORMAL HIGH (ref 11.5–14.5)
WBC: 5.6 10*3/uL (ref 3.6–11.0)

## 2017-12-31 LAB — IRON AND TIBC
Iron: 39 ug/dL (ref 28–170)
Saturation Ratios: 17 % (ref 10.4–31.8)
TIBC: 234 ug/dL — ABNORMAL LOW (ref 250–450)
UIBC: 195 ug/dL

## 2017-12-31 LAB — FERRITIN: Ferritin: 98 ng/mL (ref 11–307)

## 2017-12-31 NOTE — Patient Instructions (Signed)
Darbepoetin Alfa injection What is this medicine? DARBEPOETIN ALFA (dar be POE e tin AL fa) helps your body make more red blood cells. It is used to treat anemia caused by chronic kidney failure and chemotherapy. This medicine may be used for other purposes; ask your health care provider or pharmacist if you have questions. COMMON BRAND NAME(S): Aranesp What should I tell my health care provider before I take this medicine? They need to know if you have any of these conditions: -blood clotting disorders or history of blood clots -cancer patient not on chemotherapy -cystic fibrosis -heart disease, such as angina, heart failure, or a history of a heart attack -hemoglobin level of 12 g/dL or greater -high blood pressure -low levels of folate, iron, or vitamin B12 -seizures -an unusual or allergic reaction to darbepoetin, erythropoietin, albumin, hamster proteins, latex, other medicines, foods, dyes, or preservatives -pregnant or trying to get pregnant -breast-feeding How should I use this medicine? This medicine is for injection into a vein or under the skin. It is usually given by a health care professional in a hospital or clinic setting. If you get this medicine at home, you will be taught how to prepare and give this medicine. Use exactly as directed. Take your medicine at regular intervals. Do not take your medicine more often than directed. It is important that you put your used needles and syringes in a special sharps container. Do not put them in a trash can. If you do not have a sharps container, call your pharmacist or healthcare provider to get one. A special MedGuide will be given to you by the pharmacist with each prescription and refill. Be sure to read this information carefully each time. Talk to your pediatrician regarding the use of this medicine in children. While this medicine may be used in children as young as 1 year for selected conditions, precautions do  apply. Overdosage: If you think you have taken too much of this medicine contact a poison control center or emergency room at once. NOTE: This medicine is only for you. Do not share this medicine with others. What if I miss a dose? If you miss a dose, take it as soon as you can. If it is almost time for your next dose, take only that dose. Do not take double or extra doses. What may interact with this medicine? Do not take this medicine with any of the following medications: -epoetin alfa This list may not describe all possible interactions. Give your health care provider a list of all the medicines, herbs, non-prescription drugs, or dietary supplements you use. Also tell them if you smoke, drink alcohol, or use illegal drugs. Some items may interact with your medicine. What should I watch for while using this medicine? Your condition will be monitored carefully while you are receiving this medicine. You may need blood work done while you are taking this medicine. What side effects may I notice from receiving this medicine? Side effects that you should report to your doctor or health care professional as soon as possible: -allergic reactions like skin rash, itching or hives, swelling of the face, lips, or tongue -breathing problems -changes in vision -chest pain -confusion, trouble speaking or understanding -feeling faint or lightheaded, falls -high blood pressure -muscle aches or pains -pain, swelling, warmth in the leg -rapid weight gain -severe headaches -sudden numbness or weakness of the face, arm or leg -trouble walking, dizziness, loss of balance or coordination -seizures (convulsions) -swelling of the ankles, feet, hands -  unusually weak or tired Side effects that usually do not require medical attention (report to your doctor or health care professional if they continue or are bothersome): -diarrhea -fever, chills (flu-like symptoms) -headaches -nausea, vomiting -redness,  stinging, or swelling at site where injected This list may not describe all possible side effects. Call your doctor for medical advice about side effects. You may report side effects to FDA at 1-800-FDA-1088. Where should I keep my medicine? Keep out of the reach of children. Store in a refrigerator between 2 and 8 degrees C (36 and 46 degrees F). Do not freeze. Do not shake. Throw away any unused portion if using a single-dose vial. Throw away any unused medicine after the expiration date. NOTE: This sheet is a summary. It may not cover all possible information. If you have questions about this medicine, talk to your doctor, pharmacist, or health care provider.  2018 Elsevier/Gold Standard (2016-07-10 19:52:26) Epoetin Alfa injection What is this medicine? EPOETIN ALFA (e POE e tin AL fa) helps your body make more red blood cells. This medicine is used to treat anemia caused by chronic kidney failure, cancer chemotherapy, or HIV-therapy. It may also be used before surgery if you have anemia. This medicine may be used for other purposes; ask your health care provider or pharmacist if you have questions. COMMON BRAND NAME(S): Epogen, Procrit What should I tell my health care provider before I take this medicine? They need to know if you have any of these conditions: -blood clotting disorders -cancer patient not on chemotherapy -cystic fibrosis -heart disease, such as angina or heart failure -hemoglobin level of 12 g/dL or greater -high blood pressure -low levels of folate, iron, or vitamin B12 -seizures -an unusual or allergic reaction to erythropoietin, albumin, benzyl alcohol, hamster proteins, other medicines, foods, dyes, or preservatives -pregnant or trying to get pregnant -breast-feeding How should I use this medicine? This medicine is for injection into a vein or under the skin. It is usually given by a health care professional in a hospital or clinic setting. If you get this  medicine at home, you will be taught how to prepare and give this medicine. Use exactly as directed. Take your medicine at regular intervals. Do not take your medicine more often than directed. It is important that you put your used needles and syringes in a special sharps container. Do not put them in a trash can. If you do not have a sharps container, call your pharmacist or healthcare provider to get one. A special MedGuide will be given to you by the pharmacist with each prescription and refill. Be sure to read this information carefully each time. Talk to your pediatrician regarding the use of this medicine in children. While this drug may be prescribed for selected conditions, precautions do apply. Overdosage: If you think you have taken too much of this medicine contact a poison control center or emergency room at once. NOTE: This medicine is only for you. Do not share this medicine with others. What if I miss a dose? If you miss a dose, take it as soon as you can. If it is almost time for your next dose, take only that dose. Do not take double or extra doses. What may interact with this medicine? Do not take this medicine with any of the following medications: -darbepoetin alfa This list may not describe all possible interactions. Give your health care provider a list of all the medicines, herbs, non-prescription drugs, or dietary supplements you use.  Also tell them if you smoke, drink alcohol, or use illegal drugs. Some items may interact with your medicine. What should I watch for while using this medicine? Your condition will be monitored carefully while you are receiving this medicine. You may need blood work done while you are taking this medicine. What side effects may I notice from receiving this medicine? Side effects that you should report to your doctor or health care professional as soon as possible: -allergic reactions like skin rash, itching or hives, swelling of the face, lips,  or tongue -breathing problems -changes in vision -chest pain -confusion, trouble speaking or understanding -feeling faint or lightheaded, falls -high blood pressure -muscle aches or pains -pain, swelling, warmth in the leg -rapid weight gain -severe headaches -sudden numbness or weakness of the face, arm or leg -trouble walking, dizziness, loss of balance or coordination -seizures (convulsions) -swelling of the ankles, feet, hands -unusually weak or tired Side effects that usually do not require medical attention (report to your doctor or health care professional if they continue or are bothersome): -diarrhea -fever, chills (flu-like symptoms) -headaches -nausea, vomiting -redness, stinging, or swelling at site where injected This list may not describe all possible side effects. Call your doctor for medical advice about side effects. You may report side effects to FDA at 1-800-FDA-1088. Where should I keep my medicine? Keep out of the reach of children. Store in a refrigerator between 2 and 8 degrees C (36 and 46 degrees F). Do not freeze or shake. Throw away any unused portion if using a single-dose vial. Multi-dose vials can be kept in the refrigerator for up to 21 days after the initial dose. Throw away unused medicine. NOTE: This sheet is a summary. It may not cover all possible information. If you have questions about this medicine, talk to your doctor, pharmacist, or health care provider.  2018 Elsevier/Gold Standard (2016-07-10 19:42:31)

## 2017-12-31 NOTE — Progress Notes (Signed)
[facility] day:  12/31/2017  Chief Complaint: Kristen Francis is a 71 y.o. female with anemia and a monoclonal gammopathy of unknown significance (MGUS) who is seen for 1 month assessment.  HPI:   The patient was last seen in the medical oncology clinic on 11/30/2017.  At that time, bone marrow was reviewed.  There was no evidence of myeloma.  CBC revealed a hematocrit of 27.5, hemoglobin 9.0, MCV 83.4, platelets 364,000, WBC 5500 with an ANC of 3800.  Haptoglobin was 194.  Retic was 1.7%.  Coombs was negative.  Ferritin was 139.  Symptomatically, patient is doing "alright". Patient has been seen in consult by Dr. Smith Francis for discussion regarding a renal biopsy. Patient notes that she missed her appointment. She has been rescheduled for 01/09/2018.   Patient denies bleeding; no hematochezia, melena, or vaginal bleeding. She has not experienced any fevers, sweats, or significant weight loss. Her weight has increased 3 pounds since her last visit.  Patient is taking oral iron supplements for the last 2 months.  Patient denies pain in the clinic today.    Past Medical History:  Diagnosis Date  . Gout   . Hyperlipemia   . Hypertension   . Stroke [facility])     Past Surgical History:  Procedure Laterality Date  . ABDOMINAL HYSTERECTOMY    . APPENDECTOMY    . COLONOSCOPY WITH PROPOFOL N/A 01/14/2016   Procedure: COLONOSCOPY WITH PROPOFOL;  Surgeon: Kristen Silvas, MD;  Location: [facility] ENDOSCOPY;  Service: Endoscopy;  Laterality: N/A;    Family History  Problem Relation Age of Onset  . Cancer Mother     Social History:  reports that she has quit smoking. she has never used smokeless tobacco. She reports that she does not drink alcohol or use drugs.  Patient is a former smoker (back in the 27s). 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 She does not accept blood products.  Her husband's name is Kristen Francis.  The patient is alone  today.  Allergies:  Allergies  Allergen Reactions  . Penicillins     Current Medications: Current Outpatient Medications  Medication Sig Dispense Refill  . allopurinol (ZYLOPRIM) 100 MG tablet Take 100 mg by mouth daily.    Marland Kitchen amLODipine (NORVASC) 10 MG tablet Take 10 mg by mouth daily.    . carvedilol (COREG) 6.25 MG tablet Take 6.25 mg by mouth 2 (two) times daily with a meal.    . cholecalciferol (VITAMIN D) 1000 units tablet Take 1,000 Units by mouth daily.    . ferrous sulfate 325 (65 FE) MG tablet Take 325 mg by mouth.    . hydrochlorothiazide (HYDRODIURIL) 25 MG tablet Take by mouth.    . losartan (COZAAR) 25 MG tablet Take 25 mg by mouth daily.     No current facility-administered medications for this visit.     Review of Systems:  GENERAL:  Feels "alright".  No fevers or sweats.  Weight up 3 pounds. PERFORMANCE STATUS (ECOG):  1 HEENT:  No visual changes, runny nose, sore throat, mouth sores or tenderness. Lungs: No shortness of breath or cough.  No hemoptysis. Cardiac:  No chest pain, palpitations, orthopnea, or PND. GI:  No nausea, vomiting, diarrhea, constipation, melena or hematochezia. GU:  No urgency, frequency, dysuria, or hematuria. Musculoskeletal:  Pain to RIGHT shoulder and bilateral knees. No back pain. No muscle tenderness. Extremities:  Arthritic pain to bilateral knees.  Arthritis pain.  No swelling.  Skin:  No rashes or skin changes. Neuro:  No headache, numbness or weakness, balance or coordination issues. Endocrine:  No diabetes, thyroid issues, hot flashes or night sweats. Psych:  No mood changes, depression or anxiety. Pain:  No pain today. Review of systems:  All other systems reviewed and found to be negative.  Physical Exam: Blood pressure (!) 174/74, pulse 77, temperature (!) 97.5 F (36.4 C), temperature source Tympanic, weight 163 lb 6.4 oz (74.1 kg). GENERAL:  Well developed, well nourished, woman sitting comfortably in the exam room in no  acute distress. MENTAL STATUS:  Alert and oriented to person, place and time. HEAD:  Short brown hair hair.  Normocephalic, atraumatic, face symmetric, no Cushingoid features. EYES:  Glasses. Brown eyes.  Pupils equal round and reactive to light and accomodation.  No conjunctivitis or scleral icterus. ENT:  Oropharynx clear without lesion.  Tongue normal. Mucous membranes moist.  RESPIRATORY:  Clear to auscultation without rales, wheezes or rhonchi. CARDIOVASCULAR:  Regular rate and rhythm without murmur, rub or gallop. ABDOMEN:  Soft, non-tender, with active bowel sounds, and no hepatosplenomegaly.  No masses. SKIN:  No rashes, ulcers or lesions. EXTREMITIES: No edema, no skin discoloration or tenderness.  No palpable cords. LYMPH NODES: No palpable cervical, supraclavicular, axillary or inguinal adenopathy  NEUROLOGICAL: Unremarkable. PSYCH:  Appropriate.   Appointment on 12/31/2017  Component Date Value Ref Range Status  . WBC 12/31/2017 5.6  3.6 - 11.0 K/uL Final  . RBC 12/31/2017 2.98* 3.80 - 5.20 MIL/uL Final  . Hemoglobin 12/31/2017 8.2* 12.0 - 16.0 g/dL Final  . HCT 12/31/2017 25.2* 35.0 - 47.0 % Final  . MCV 12/31/2017 84.6  80.0 - 100.0 fL Final  . MCH 12/31/2017 27.5  26.0 - 34.0 pg Final  . MCHC 12/31/2017 32.5  32.0 - 36.0 g/dL Final  . RDW 12/31/2017 17.9* 11.5 - 14.5 % Final  . Platelets 12/31/2017 321  150 - 440 K/uL Final  . Neutrophils Relative % 12/31/2017 65  % Final  . Neutro Abs 12/31/2017 3.6  1.4 - 6.5 K/uL Final  . Lymphocytes Relative 12/31/2017 23  % Final  . Lymphs Abs 12/31/2017 1.3  1.0 - 3.6 K/uL Final  . Monocytes Relative 12/31/2017 8  % Final  . Monocytes Absolute 12/31/2017 0.4  0.2 - 0.9 K/uL Final  . Eosinophils Relative 12/31/2017 2  % Final  . Eosinophils Absolute 12/31/2017 0.1  0 - 0.7 K/uL Final  . Basophils Relative 12/31/2017 2  % Final  . Basophils Absolute 12/31/2017 0.1  0 - 0.1 K/uL Final   Performed at [facility], [address] [place], [place] [address]    Assessment:  Kristen Francis is a 72 y.o. female with anemia and a monoclonal gammopathy of unknown significance (MGUS).  The patient has chronic kidney disease.  Her creatinine was 2.46 (CrCl 22 ml/min) on 12/12/2016, 2.7 (CrCl 20 ml/min) on 01/2017, and 3.21 (CrCl 14-17 ml/min) on 09/06/2017. Her hemoglobin was 8.2. She was started on oral iron.   Labs on 12/22/2016 revealed a negative SPEP.  B12 and folate were normal.  Light chains were 1.85.  UPEP was negative.  Labs on 09/06/2017 revealed an SPEP with an irregularity in the gamma region, which may represent a monoclonal protein.  Immunofixation revealed an IgG kappa monoclonal protein.  Work-up on 09/17/2017:  Hematocrit was 25.5, hemoglobin 8.4, MCV 81.4, platelets 379,000, WBC 5100 with an ANC of 3400.  Ferritin was 25 with a saturation of 11% with  a TIBC of 317.  Normal labs included:  B12, folate was 18.4, epo level (12.9),  And TSH.   Sed rate was 83.  SPEP revealed a 0.2 gm/dL IgG monoclonal protein with kappa light chain specificity.  IgG was 910 and IgA 138.  Kappa free light chains were 102.3, lambda free light chains 49.5 with a ratio of 2.07 (0.26 - 1.65).  Ferritin has been followed: 25 on 09/17/2017, 191 on 11/02/2017, and 139 on 11/30/2017.  She received weekly Venofer x 3 (09/28/2017 - 10/12/2017).  Bone marrow aspiration and biopsy on 11/09/2017 revealed a normocellular marrow with mild plasmacytosis (5% CD138, 4% by aspirate).  There was mild erythroid hyperplasia.  Iron stores were present.  Flow cytometry revealed no monoclonal B cell or aberrant T cell population. Cytogenetics were normal (41, XX).   Last colonoscopy in 2016 showed "a few polyps".  Oral iron causes constipation.  Diet is "ok". She eats meat about 5 days a week. She denies ice pica. She denies bleeding (hematochezia, melena, or vaginal bleeding).   Symptomatically, patient is doing well. Exam is stable.  Hemoglobin is  8.2 with a hematocrit of 25.2.  Plan: 1.  Labs today:  CBC with diff, ferritin, iron stores, sed rate. 2.  Discuss anemia. Patient's hematocrit has dropped to 25.2. Discussed potential need for blood transfusion in the near future. Discuss adding Procrit or Aranesp injections to prevent patient from becoming symptomatically anemic. Will discuss with nephrology.   3.  Preauthorize Procrit.  4.  Follow up with Dr. Smith Francis as scheduled next week. 5.  RTC in 6 weeks for MD assessment, labs (CBC with diff, BMP).   Honor Loh, NP 12/31/2017, 3:38 PM   I saw and evaluated the patient, participating in the key portions of the service and reviewing pertinent diagnostic studies and records.  I reviewed the nurse practitioner's note and agree with the findings and the plan.  The assessment and plan were discussed with the patient. A few questions were asked by the patient and answered.   Nolon Stalls, MD 12/31/2017,3:38 PM

## 2018-01-06 ENCOUNTER — Encounter: Payer: Self-pay | Admitting: Hematology and Oncology

## 2018-01-09 DIAGNOSIS — I10 Essential (primary) hypertension: Secondary | ICD-10-CM | POA: Diagnosis not present

## 2018-01-09 DIAGNOSIS — R319 Hematuria, unspecified: Secondary | ICD-10-CM | POA: Diagnosis not present

## 2018-01-09 DIAGNOSIS — I129 Hypertensive chronic kidney disease with stage 1 through stage 4 chronic kidney disease, or unspecified chronic kidney disease: Secondary | ICD-10-CM | POA: Diagnosis not present

## 2018-01-09 DIAGNOSIS — R809 Proteinuria, unspecified: Secondary | ICD-10-CM | POA: Diagnosis not present

## 2018-01-09 DIAGNOSIS — R635 Abnormal weight gain: Secondary | ICD-10-CM | POA: Diagnosis not present

## 2018-01-09 DIAGNOSIS — R748 Abnormal levels of other serum enzymes: Secondary | ICD-10-CM | POA: Diagnosis not present

## 2018-01-09 DIAGNOSIS — N184 Chronic kidney disease, stage 4 (severe): Secondary | ICD-10-CM | POA: Diagnosis not present

## 2018-01-25 DIAGNOSIS — I7090 Unspecified atherosclerosis: Secondary | ICD-10-CM | POA: Diagnosis not present

## 2018-01-25 DIAGNOSIS — N189 Chronic kidney disease, unspecified: Secondary | ICD-10-CM | POA: Diagnosis not present

## 2018-01-25 DIAGNOSIS — N179 Acute kidney failure, unspecified: Secondary | ICD-10-CM | POA: Diagnosis not present

## 2018-01-25 DIAGNOSIS — N051 Unspecified nephritic syndrome with focal and segmental glomerular lesions: Secondary | ICD-10-CM | POA: Diagnosis not present

## 2018-02-08 ENCOUNTER — Other Ambulatory Visit: Payer: Self-pay | Admitting: *Deleted

## 2018-02-08 DIAGNOSIS — D472 Monoclonal gammopathy: Secondary | ICD-10-CM

## 2018-02-11 ENCOUNTER — Inpatient Hospital Stay (HOSPITAL_BASED_OUTPATIENT_CLINIC_OR_DEPARTMENT_OTHER): Payer: Medicare Other | Admitting: Hematology and Oncology

## 2018-02-11 ENCOUNTER — Inpatient Hospital Stay: Payer: Medicare Other

## 2018-02-11 ENCOUNTER — Encounter: Payer: Self-pay | Admitting: Hematology and Oncology

## 2018-02-11 ENCOUNTER — Other Ambulatory Visit: Payer: Self-pay | Admitting: Hematology and Oncology

## 2018-02-11 ENCOUNTER — Inpatient Hospital Stay: Payer: Medicare Other | Attending: Hematology and Oncology | Admitting: *Deleted

## 2018-02-11 VITALS — BP 152/80 | HR 69 | Temp 97.3°F | Resp 20 | Wt 163.3 lb

## 2018-02-11 DIAGNOSIS — N189 Chronic kidney disease, unspecified: Secondary | ICD-10-CM | POA: Diagnosis not present

## 2018-02-11 DIAGNOSIS — I129 Hypertensive chronic kidney disease with stage 1 through stage 4 chronic kidney disease, or unspecified chronic kidney disease: Secondary | ICD-10-CM | POA: Diagnosis not present

## 2018-02-11 DIAGNOSIS — D631 Anemia in chronic kidney disease: Secondary | ICD-10-CM | POA: Insufficient documentation

## 2018-02-11 DIAGNOSIS — E785 Hyperlipidemia, unspecified: Secondary | ICD-10-CM

## 2018-02-11 DIAGNOSIS — D649 Anemia, unspecified: Secondary | ICD-10-CM

## 2018-02-11 DIAGNOSIS — M1990 Unspecified osteoarthritis, unspecified site: Secondary | ICD-10-CM | POA: Diagnosis not present

## 2018-02-11 DIAGNOSIS — D472 Monoclonal gammopathy: Secondary | ICD-10-CM

## 2018-02-11 DIAGNOSIS — Z8673 Personal history of transient ischemic attack (TIA), and cerebral infarction without residual deficits: Secondary | ICD-10-CM | POA: Diagnosis not present

## 2018-02-11 DIAGNOSIS — Z880 Allergy status to penicillin: Secondary | ICD-10-CM

## 2018-02-11 DIAGNOSIS — N184 Chronic kidney disease, stage 4 (severe): Principal | ICD-10-CM

## 2018-02-11 DIAGNOSIS — Z79899 Other long term (current) drug therapy: Secondary | ICD-10-CM

## 2018-02-11 DIAGNOSIS — Z87891 Personal history of nicotine dependence: Secondary | ICD-10-CM | POA: Diagnosis not present

## 2018-02-11 DIAGNOSIS — Z809 Family history of malignant neoplasm, unspecified: Secondary | ICD-10-CM

## 2018-02-11 LAB — BASIC METABOLIC PANEL
Anion gap: 8 (ref 5–15)
BUN: 49 mg/dL — ABNORMAL HIGH (ref 6–20)
CO2: 18 mmol/L — ABNORMAL LOW (ref 22–32)
Calcium: 9.3 mg/dL (ref 8.9–10.3)
Chloride: 116 mmol/L — ABNORMAL HIGH (ref 101–111)
Creatinine, Ser: 3.33 mg/dL — ABNORMAL HIGH (ref 0.44–1.00)
GFR calc Af Amer: 15 mL/min — ABNORMAL LOW (ref >60)
GFR calc non Af Amer: 13 mL/min — ABNORMAL LOW (ref >60)
Glucose, Bld: 111 mg/dL — ABNORMAL HIGH (ref 65–99)
Potassium: 4.4 mmol/L (ref 3.5–5.1)
Sodium: 142 mmol/L (ref 135–145)

## 2018-02-11 LAB — CBC WITH DIFFERENTIAL/PLATELET
Basophils Absolute: 0 10*3/uL (ref 0–0.1)
Basophils Relative: 0 %
Eosinophils Absolute: 0.1 10*3/uL (ref 0–0.7)
Eosinophils Relative: 2 %
HCT: 24.2 % — ABNORMAL LOW (ref 35.0–47.0)
Hemoglobin: 8 g/dL — ABNORMAL LOW (ref 12.0–16.0)
Lymphocytes Relative: 22 %
Lymphs Abs: 1.1 10*3/uL (ref 1.0–3.6)
MCH: 28.2 pg (ref 26.0–34.0)
MCHC: 33.1 g/dL (ref 32.0–36.0)
MCV: 85 fL (ref 80.0–100.0)
Monocytes Absolute: 0.4 10*3/uL (ref 0.2–0.9)
Monocytes Relative: 8 %
Neutro Abs: 3.3 10*3/uL (ref 1.4–6.5)
Neutrophils Relative %: 68 %
Platelets: 318 10*3/uL (ref 150–440)
RBC: 2.85 MIL/uL — ABNORMAL LOW (ref 3.80–5.20)
RDW: 16.7 % — ABNORMAL HIGH (ref 11.5–14.5)
WBC: 4.9 10*3/uL (ref 3.6–11.0)

## 2018-02-11 LAB — IRON AND TIBC
Iron: 31 ug/dL (ref 28–170)
Saturation Ratios: 13 % (ref 10.4–31.8)
TIBC: 240 ug/dL — ABNORMAL LOW (ref 250–450)
UIBC: 209 ug/dL

## 2018-02-11 LAB — FERRITIN: Ferritin: 78 ng/mL (ref 11–307)

## 2018-02-11 MED ORDER — EPOETIN ALFA 10000 UNIT/ML IJ SOLN
10000.0000 [IU] | Freq: Once | INTRAMUSCULAR | Status: AC
  Administered 2018-02-11: 10000 [IU] via SUBCUTANEOUS
  Filled 2018-02-11: qty 2

## 2018-02-11 NOTE — Progress Notes (Signed)
Patient states she sometimes gets cramps in her legs.  Otherwise, no complaints today.

## 2018-02-11 NOTE — Progress Notes (Signed)
[facility] day:  02/11/2018  Chief Complaint: Kristen Francis is a 72 y.o. female with anemia of chronic renal disease and a monoclonal gammopathy of unknown significance (MGUS) who is seen for 6 week assessment.  HPI:   The patient was last seen in the medical oncology clinic on 12/31/2017.  At that time, she was doing well. Exam was stable.  Hemoglobin was 8.2 with a hematocrit of 25.2.  We discussed Procrit.  She was to follow-up with nephrology.  During the interim, patient has been doing "pretty good". Patient underwent a renal biopsy on 01/25/2018 that demonstrated focal segmental glomerulosclerosis (FSGS), which accounts for her renal insuffiencey.  Patient denies chest pain, shortness of breath, and palpitations. She notes chronic arthritic pain in her knees and shoulders.   Patient denies any B symptoms or interval infections. Patient eating well. Her weight is stable. Patient denies pain in the clinic today.    Past Medical History:  Diagnosis Date  . Gout   . Hyperlipemia   . Hypertension   . Stroke [facility])     Past Surgical History:  Procedure Laterality Date  . ABDOMINAL HYSTERECTOMY    . APPENDECTOMY    . COLONOSCOPY WITH PROPOFOL N/A 01/14/2016   Procedure: COLONOSCOPY WITH PROPOFOL;  Surgeon: Manya Silvas, MD;  Location: [facility] ENDOSCOPY;  Service: Endoscopy;  Laterality: N/A;    Family History  Problem Relation Age of Onset  . Cancer Mother     Social History:  reports that she has quit smoking. she has never used smokeless tobacco. She reports that she does not drink alcohol or use drugs.  Patient is a former smoker (back in the 84s). 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  Her husband's name is Juanda Crumble.  The patient is alone today.  Allergies:  Allergies  Allergen Reactions  . Penicillins     Current  Medications: Current Outpatient Medications  Medication Sig Dispense Refill  . allopurinol (ZYLOPRIM) 100 MG tablet Take 100 mg by mouth daily.    Marland Kitchen amLODipine (NORVASC) 10 MG tablet Take 10 mg by mouth daily.    . carvedilol (COREG) 6.25 MG tablet Take 6.25 mg by mouth 2 (two) times daily with a meal.    . cholecalciferol (VITAMIN D) 1000 units tablet Take 1,000 Units by mouth daily.    . ferrous sulfate 325 (65 FE) MG tablet Take 325 mg by mouth.    . hydrochlorothiazide (HYDRODIURIL) 25 MG tablet Take by mouth.    . losartan (COZAAR) 25 MG tablet Take 25 mg by mouth daily.     No current facility-administered medications for this visit.     Review of Systems:  GENERAL:  Feels "pretty good".  No fevers or sweats.  Weight stable. PERFORMANCE STATUS (ECOG):  1 HEENT:  No visual changes, runny nose, sore throat, mouth sores or tenderness. Lungs: No shortness of breath or cough.  No hemoptysis. Cardiac:  No chest pain, palpitations, orthopnea, or PND. GI:  No nausea, vomiting, diarrhea, constipation, melena or hematochezia. GU:  No urgency, frequency, dysuria, or hematuria. Musculoskeletal:  Pain to RIGHT shoulder and bilateral knees. No back pain. No muscle tenderness. Extremities:  Arthritic pain to bilateral knees.  Arthritis pain.  No swelling. Skin:  No rashes or skin changes. Neuro:  No headache, numbness or weakness, balance or coordination issues. Endocrine:  No diabetes, thyroid issues,  hot flashes or night sweats. Psych:  No mood changes, depression or anxiety. Pain:  No pain today. Review of systems:  All other systems reviewed and found to be negative.  Physical Exam: Blood pressure (!) 152/80, pulse 69, temperature (!) 97.3 F (36.3 C), temperature source Tympanic, resp. rate 20, weight 163 lb 5 oz (74.1 kg). GENERAL:  Well developed, well nourished, woman sitting comfortably in the exam room in no acute distress. MENTAL STATUS:  Alert and oriented to person, place and  time. HEAD:  [place] wig.  Normocephalic, atraumatic, face symmetric, no Cushingoid features. EYES:  Glasses. Brown eyes.  Pupils equal round and reactive to light and accomodation.  No conjunctivitis or scleral icterus. ENT:  Oropharynx clear without lesion.  Tongue normal. Mucous membranes moist.  RESPIRATORY:  Clear to auscultation without rales, wheezes or rhonchi. CARDIOVASCULAR:  Regular rate and rhythm without murmur, rub or gallop. ABDOMEN:  Soft, non-tender, with active bowel sounds, and no hepatosplenomegaly.  No masses. SKIN:  No rashes, ulcers or lesions. EXTREMITIES: No edema, no skin discoloration or tenderness.  No palpable cords. LYMPH NODES: No palpable cervical, supraclavicular, axillary or inguinal adenopathy  NEUROLOGICAL: Unremarkable. PSYCH:  Appropriate.   Clinical Support on 02/11/2018  Component Date Value Ref Range Status  . Sodium 02/11/2018 142  135 - 145 mmol/L Final  . Potassium 02/11/2018 4.4  3.5 - 5.1 mmol/L Final  . Chloride 02/11/2018 116* 101 - 111 mmol/L Final  . CO2 02/11/2018 18* 22 - 32 mmol/L Final  . Glucose, Bld 02/11/2018 111* 65 - 99 mg/dL Final  . BUN 02/11/2018 49* 6 - 20 mg/dL Final  . Creatinine, Ser 02/11/2018 3.33* 0.44 - 1.00 mg/dL Final  . Calcium 02/11/2018 9.3  8.9 - 10.3 mg/dL Final  . GFR calc non Af Amer 02/11/2018 13* >60 mL/min Final  . GFR calc Af Amer 02/11/2018 15* >60 mL/min Final   Comment: (NOTE) The eGFR has been calculated using the CKD EPI equation. This calculation has not been validated in all clinical situations. eGFR's persistently <60 mL/min signify possible Chronic Kidney Disease.   Georgiann Hahn gap 02/11/2018 8  5 - 15 Final   Performed at [facility], [address] [place], [place] [address]  . WBC 02/11/2018 4.9  3.6 - 11.0 K/uL Final  . RBC 02/11/2018 2.85* 3.80 - 5.20 MIL/uL Final  . Hemoglobin 02/11/2018 8.0* 12.0 - 16.0 g/dL Final  . HCT 02/11/2018 24.2* 35.0 - 47.0 % Final  . MCV 02/11/2018  85.0  80.0 - 100.0 fL Final  . MCH 02/11/2018 28.2  26.0 - 34.0 pg Final  . MCHC 02/11/2018 33.1  32.0 - 36.0 g/dL Final  . RDW 02/11/2018 16.7* 11.5 - 14.5 % Final  . Platelets 02/11/2018 318  150 - 440 K/uL Final  . Neutrophils Relative % 02/11/2018 68  % Final  . Neutro Abs 02/11/2018 3.3  1.4 - 6.5 K/uL Final  . Lymphocytes Relative 02/11/2018 22  % Final  . Lymphs Abs 02/11/2018 1.1  1.0 - 3.6 K/uL Final  . Monocytes Relative 02/11/2018 8  % Final  . Monocytes Absolute 02/11/2018 0.4  0.2 - 0.9 K/uL Final  . Eosinophils Relative 02/11/2018 2  % Final  . Eosinophils Absolute 02/11/2018 0.1  0 - 0.7 K/uL Final  . Basophils Relative 02/11/2018 0  % Final  . Basophils Absolute 02/11/2018 0.0  0 - 0.1 K/uL Final   Performed at [facility], [address], [place], [place] [address]  Assessment:  Carlissa Pesola is a 72 y.o. female with anemia of chronic renal disease and a monoclonal gammopathy of unknown significance (MGUS).  The patient has chronic kidney disease.  Her creatinine was 2.46 (CrCl 22 ml/min) on 12/12/2016, 2.7 (CrCl 20 ml/min) on 01/2017, and 3.21 (CrCl 14-17 ml/min) on 09/06/2017. Her hemoglobin was 8.2. She was started on oral iron.   Labs on 12/22/2016 revealed a negative SPEP.  B12 and folate were normal.  Light chains were 1.85.  UPEP was negative.  Labs on 09/06/2017 revealed an SPEP with an irregularity in the gamma region, which may represent a monoclonal protein.  Immunofixation revealed an IgG kappa monoclonal protein.  Work-up on 09/17/2017:  Hematocrit was 25.5, hemoglobin 8.4, MCV 81.4, platelets 379,000, WBC 5100 with an ANC of 3400.  Ferritin was 25 with a saturation of 11% with a TIBC of 317.  Normal labs included:  B12, folate was 18.4, epo level (12.9),  And TSH.   Sed rate was 83.  SPEP revealed a 0.2 gm/dL IgG monoclonal protein with kappa light chain specificity.  IgG was 910 and IgA 138.  Kappa free light chains were 102.3, lambda free light chains  49.5 with a ratio of 2.07 (0.26 - 1.65).  Ferritin has been followed: 25 on 09/17/2017, 191 on 11/02/2017, and 139 on 11/30/2017.  She received weekly Venofer x 3 (09/28/2017 - 10/12/2017).  Bone marrow aspiration and biopsy on 11/09/2017 revealed a normocellular marrow with mild plasmacytosis (5% CD138, 4% by aspirate).  There was mild erythroid hyperplasia.  Iron stores were present.  Flow cytometry revealed no monoclonal B cell or aberrant T cell population. Cytogenetics were normal (76, XX).   Last colonoscopy in 2016 showed "a few polyps".  Oral iron causes constipation.  Diet is "ok". She eats meat about 5 days a week. She denies ice pica. She denies bleeding (hematochezia, melena, or vaginal bleeding).   Renal biopsy on 01/25/2018 demonstrated focal segmental glomerulosclerosis (FSGS).  Symptomatically, patient is doing well. She has chronic arthritic pain in her shoulders and knees. Renal biopsy confirmed FSGS. Exam is stable.  Hemoglobin is 8.0 with a hematocrit of 24.2. BUN 49 and creatinine 3.33 (CrCl 16.2 mL/min).   Plan: 1.  Labs today:  CBC with diff, BMP, ferritin, iron studies, hold tube. 2.  Discuss anemia. Patient's hematocrit has dropped to 24.2. Discussed with nephrology (Dr Smith Mince).  Potential side effects reviewed.  Patient consented to treatment.  Begin Procrit today.    3.  RTC every 2 weeks for labs (CBC with diff) and Procrit.  4.  RTC in 6 weeks for MD assessment, labs (CBC with diff, BMP), and Procrit.    Honor Loh, NP 02/11/2018, 11:30 AM   I saw and evaluated the patient, participating in the key portions of the service and reviewing pertinent diagnostic studies and records.  I reviewed the nurse practitioner's note and agree with the findings and the plan.  The assessment and plan were discussed with the patient. A few questions were asked by the patient and answered.   Nolon Stalls, MD 02/11/2018,11:30 AM

## 2018-02-25 ENCOUNTER — Inpatient Hospital Stay: Payer: Medicare Other

## 2018-02-25 VITALS — BP 143/79 | HR 74

## 2018-02-25 DIAGNOSIS — D472 Monoclonal gammopathy: Secondary | ICD-10-CM | POA: Diagnosis not present

## 2018-02-25 DIAGNOSIS — E785 Hyperlipidemia, unspecified: Secondary | ICD-10-CM | POA: Diagnosis not present

## 2018-02-25 DIAGNOSIS — M1990 Unspecified osteoarthritis, unspecified site: Secondary | ICD-10-CM | POA: Diagnosis not present

## 2018-02-25 DIAGNOSIS — D631 Anemia in chronic kidney disease: Secondary | ICD-10-CM | POA: Diagnosis not present

## 2018-02-25 DIAGNOSIS — D649 Anemia, unspecified: Secondary | ICD-10-CM

## 2018-02-25 DIAGNOSIS — I129 Hypertensive chronic kidney disease with stage 1 through stage 4 chronic kidney disease, or unspecified chronic kidney disease: Secondary | ICD-10-CM | POA: Diagnosis not present

## 2018-02-25 DIAGNOSIS — N189 Chronic kidney disease, unspecified: Secondary | ICD-10-CM | POA: Diagnosis not present

## 2018-02-25 LAB — CBC WITH DIFFERENTIAL/PLATELET
Basophils Absolute: 0.1 10*3/uL (ref 0–0.1)
Basophils Relative: 2 %
Eosinophils Absolute: 0.1 10*3/uL (ref 0–0.7)
Eosinophils Relative: 2 %
HCT: 24.5 % — ABNORMAL LOW (ref 35.0–47.0)
Hemoglobin: 8.1 g/dL — ABNORMAL LOW (ref 12.0–16.0)
Lymphocytes Relative: 24 %
Lymphs Abs: 1.2 10*3/uL (ref 1.0–3.6)
MCH: 28.2 pg (ref 26.0–34.0)
MCHC: 33.1 g/dL (ref 32.0–36.0)
MCV: 85.1 fL (ref 80.0–100.0)
Monocytes Absolute: 0.3 10*3/uL (ref 0.2–0.9)
Monocytes Relative: 6 %
Neutro Abs: 3.4 10*3/uL (ref 1.4–6.5)
Neutrophils Relative %: 66 %
Platelets: 301 10*3/uL (ref 150–440)
RBC: 2.88 MIL/uL — ABNORMAL LOW (ref 3.80–5.20)
RDW: 17.3 % — ABNORMAL HIGH (ref 11.5–14.5)
WBC: 5.2 10*3/uL (ref 3.6–11.0)

## 2018-02-25 MED ORDER — EPOETIN ALFA 10000 UNIT/ML IJ SOLN
10000.0000 [IU] | Freq: Once | INTRAMUSCULAR | Status: AC
  Administered 2018-02-25: 10000 [IU] via SUBCUTANEOUS
  Filled 2018-02-25: qty 2

## 2018-03-05 DIAGNOSIS — N184 Chronic kidney disease, stage 4 (severe): Secondary | ICD-10-CM | POA: Diagnosis not present

## 2018-03-11 ENCOUNTER — Inpatient Hospital Stay: Payer: Medicare Other | Attending: Hematology and Oncology

## 2018-03-11 ENCOUNTER — Inpatient Hospital Stay: Payer: Medicare Other

## 2018-03-11 ENCOUNTER — Other Ambulatory Visit: Payer: Self-pay

## 2018-03-11 VITALS — BP 141/72 | HR 73

## 2018-03-11 DIAGNOSIS — D631 Anemia in chronic kidney disease: Secondary | ICD-10-CM | POA: Insufficient documentation

## 2018-03-11 DIAGNOSIS — D472 Monoclonal gammopathy: Secondary | ICD-10-CM

## 2018-03-11 DIAGNOSIS — N189 Chronic kidney disease, unspecified: Secondary | ICD-10-CM | POA: Insufficient documentation

## 2018-03-11 DIAGNOSIS — D649 Anemia, unspecified: Secondary | ICD-10-CM

## 2018-03-11 DIAGNOSIS — I129 Hypertensive chronic kidney disease with stage 1 through stage 4 chronic kidney disease, or unspecified chronic kidney disease: Secondary | ICD-10-CM | POA: Diagnosis not present

## 2018-03-11 LAB — CBC WITH DIFFERENTIAL/PLATELET
Basophils Absolute: 0.1 10*3/uL (ref 0–0.1)
Basophils Relative: 2 %
Eosinophils Absolute: 0.2 10*3/uL (ref 0–0.7)
Eosinophils Relative: 3 %
HCT: 24.6 % — ABNORMAL LOW (ref 35.0–47.0)
Hemoglobin: 8.1 g/dL — ABNORMAL LOW (ref 12.0–16.0)
Lymphocytes Relative: 28 %
Lymphs Abs: 1.4 10*3/uL (ref 1.0–3.6)
MCH: 28.1 pg (ref 26.0–34.0)
MCHC: 33.1 g/dL (ref 32.0–36.0)
MCV: 84.9 fL (ref 80.0–100.0)
Monocytes Absolute: 0.4 10*3/uL (ref 0.2–0.9)
Monocytes Relative: 8 %
Neutro Abs: 2.8 10*3/uL (ref 1.4–6.5)
Neutrophils Relative %: 59 %
Platelets: 317 10*3/uL (ref 150–440)
RBC: 2.89 MIL/uL — ABNORMAL LOW (ref 3.80–5.20)
RDW: 17.3 % — ABNORMAL HIGH (ref 11.5–14.5)
WBC: 4.8 10*3/uL (ref 3.6–11.0)

## 2018-03-11 MED ORDER — EPOETIN ALFA 10000 UNIT/ML IJ SOLN
10000.0000 [IU] | Freq: Once | INTRAMUSCULAR | Status: AC
  Administered 2018-03-11: 10000 [IU] via SUBCUTANEOUS

## 2018-03-19 ENCOUNTER — Telehealth: Payer: Self-pay | Admitting: *Deleted

## 2018-03-19 NOTE — Telephone Encounter (Signed)
Patient (Pt wants to cancel all appts on 03/26/18 for lab, MD, and injection. States that she has other appts on that same day in [place] and needs to reschedule) Per Patient request to come on 04/08/18

## 2018-03-21 DIAGNOSIS — Z8673 Personal history of transient ischemic attack (TIA), and cerebral infarction without residual deficits: Secondary | ICD-10-CM | POA: Diagnosis not present

## 2018-03-21 DIAGNOSIS — N051 Unspecified nephritic syndrome with focal and segmental glomerular lesions: Secondary | ICD-10-CM | POA: Diagnosis not present

## 2018-03-21 DIAGNOSIS — Z880 Allergy status to penicillin: Secondary | ICD-10-CM | POA: Diagnosis not present

## 2018-03-21 DIAGNOSIS — D649 Anemia, unspecified: Secondary | ICD-10-CM | POA: Diagnosis not present

## 2018-03-21 DIAGNOSIS — E785 Hyperlipidemia, unspecified: Secondary | ICD-10-CM | POA: Diagnosis not present

## 2018-03-21 DIAGNOSIS — Z79899 Other long term (current) drug therapy: Secondary | ICD-10-CM | POA: Diagnosis not present

## 2018-03-21 DIAGNOSIS — N184 Chronic kidney disease, stage 4 (severe): Secondary | ICD-10-CM | POA: Diagnosis not present

## 2018-03-21 DIAGNOSIS — Z87891 Personal history of nicotine dependence: Secondary | ICD-10-CM | POA: Diagnosis not present

## 2018-03-21 DIAGNOSIS — N281 Cyst of kidney, acquired: Secondary | ICD-10-CM | POA: Diagnosis not present

## 2018-03-21 DIAGNOSIS — I129 Hypertensive chronic kidney disease with stage 1 through stage 4 chronic kidney disease, or unspecified chronic kidney disease: Secondary | ICD-10-CM | POA: Diagnosis not present

## 2018-03-26 ENCOUNTER — Other Ambulatory Visit: Payer: Medicare Other

## 2018-03-26 ENCOUNTER — Ambulatory Visit: Payer: Medicare Other

## 2018-03-26 ENCOUNTER — Ambulatory Visit: Payer: Medicare Other | Admitting: Hematology and Oncology

## 2018-04-01 DIAGNOSIS — D631 Anemia in chronic kidney disease: Secondary | ICD-10-CM | POA: Diagnosis not present

## 2018-04-01 DIAGNOSIS — N185 Chronic kidney disease, stage 5: Secondary | ICD-10-CM | POA: Diagnosis not present

## 2018-04-07 ENCOUNTER — Other Ambulatory Visit: Payer: Self-pay | Admitting: Internal Medicine

## 2018-04-07 NOTE — Progress Notes (Signed)
[facility] day:  04/08/2018  Chief Complaint: Kristen Francis is a 72 y.o. female with anemia of chronic renal disease and a monoclonal gammopathy of unknown significance (MGUS) who is seen for 2 month assessment.  HPI:   The patient was last seen in the medical oncology clinic on 02/11/2018.  At that time, patient was doing "pretty good". She denied acute concerns. Recent renal biopsy (+) for focal segmental glomerulosclerosis (FSGS), accounting for her noted renal insufficiency. She complained of chronic arthritic pain in her knees and shoulders. Exam was stable.  Hemoglobin was 8.0 with a hematocrit of 24.2. BUN 49 and creatinine 3.33 (CrCl 16.2 mL/min). She began Procrit injections following approval from nephrology [facility] Mince).   CBC on 02/25/2018 revealed a WBC of 5200 ([place] 3400). Hemoglobin 8.1, hematocrit 24.5, MCV 85.1, and platelets 301,000. She received Procrit 10,000 units. CBC on 03/11/2018 revealed a WBC of 4800 ([place] 2800). Hemoglobin 8.1, hematocrit 24.6, MCV 84.9, and platelets 317,000. She received Procrit 10,000 units.   Patient seen in follow up consult on 03/05/2018 by Dr. Trinda Pascal. Discussed FSGS diagnosis. Biopsy was negative for MGRS, which was of initial concern given patient's history of MGUS. She was provided information on dialysis, and was started on torsemide 5 mg daily. Patient referred to [facility] kidney transplant.  She was seen in consult on 03/21/2018 by Dr. Urban Gibson [facility] transplant nephrology). FSGS felt to be secondary to HTN, however with the caveat of it also sometimes being autoimmune.  Due to the degree of patient's renal scarring, treatment for autoimmune etiology not recommended. Discussed dialysis treatments as a bridge to transplant.   Symptomatically, patient is doing well today. Patient notes that her legs are "tired". Patient denies bleeding; no hematochezia, melena, or gross hematuria.  Patient continues to make  urine. Patient experiences nocturia despite changes to diuretic therapy.   Patient denies B symptoms and interval infections. Patient is eating well. Weight has decreased by 1 pound. Patient denies pain in the clinic today.    Past Medical History:  Diagnosis Date  . Gout   . Hyperlipemia   . Hypertension   . Stroke [facility])     Past Surgical History:  Procedure Laterality Date  . ABDOMINAL HYSTERECTOMY    . APPENDECTOMY    . COLONOSCOPY WITH PROPOFOL N/A 01/14/2016   Procedure: COLONOSCOPY WITH PROPOFOL;  Surgeon: Manya Silvas, MD;  Location: [facility] ENDOSCOPY;  Service: Endoscopy;  Laterality: N/A;    Family History  Problem Relation Age of Onset  . Cancer Mother     Social History:  reports that she has quit smoking. She has never used smokeless tobacco. She reports that she does not drink alcohol or use drugs.  Patient is a former smoker (back in the 43s). 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  Her husband's name is Juanda Crumble.  The patient is alone today.  Allergies:  Allergies  Allergen Reactions  . Penicillins     Current Medications: Current Outpatient Medications  Medication Sig Dispense Refill  . allopurinol (ZYLOPRIM) 100 MG tablet Take 100 mg by mouth daily.    Marland Kitchen amLODipine (NORVASC) 10 MG tablet Take 10 mg by mouth daily.    . carvedilol (COREG) 6.25 MG tablet Take 6.25 mg by mouth 2 (two) times daily with a meal.    . cholecalciferol (VITAMIN D) 1000 units tablet Take 1,000 Units  by mouth daily.    . ferrous sulfate 325 (65 FE) MG tablet Take 325 mg by mouth.    . losartan (COZAAR) 25 MG tablet Take 25 mg by mouth daily.    Marland Kitchen torsemide (DEMADEX) 5 MG tablet Take 5 mg by mouth daily.  11   No current facility-administered medications for this visit.     Review of Systems  Constitutional: Positive for weight loss (down 1 pound). Negative for diaphoresis, fever and  malaise/fatigue.  HENT: Negative.   Eyes: Negative.   Respiratory: Negative for cough, hemoptysis, sputum production and shortness of breath.   Cardiovascular: Negative for chest pain, palpitations, orthopnea, leg swelling and PND.  Gastrointestinal: Negative for abdominal pain, blood in stool, constipation, diarrhea, melena, nausea and vomiting.  Genitourinary: Positive for frequency (nocturia). Negative for dysuria, hematuria and urgency.       CKD-4  Musculoskeletal: Positive for joint pain (RIGHT shoulder and BILATERAL knees). Negative for back pain, falls and myalgias.       H/o gout. BLE feel "tired".   Skin: Negative for itching and rash.  Neurological: Negative for dizziness, tremors, weakness and headaches.  Endo/Heme/Allergies: Does not bruise/bleed easily.  Psychiatric/Behavioral: Negative for depression, memory loss and suicidal ideas. The patient is not nervous/anxious and does not have insomnia.   All other systems reviewed and are negative.  Physical Exam: Blood pressure (!) 168/78, pulse 79, temperature (!) 97.4 F (36.3 C), temperature source Tympanic, resp. rate 18, weight 162 lb 2 oz (73.5 kg), SpO2 99 %. GENERAL:  Well developed, well nourished, woman sitting comfortably in the exam room in no acute distress. MENTAL STATUS:  Alert and oriented to person, place and time. HEAD:  Shoulder length brown hair.  Normocephalic, atraumatic, face symmetric, no Cushingoid features. EYES:  Glasses.  Brown eyes.  Pupils equal round and reactive to light and accomodation.  No conjunctivitis or scleral icterus. ENT:  Oropharynx clear without lesion.  Tongue normal. Mucous membranes moist.  RESPIRATORY:  Clear to auscultation without rales, wheezes or rhonchi. CARDIOVASCULAR:  Regular rate and rhythm without murmur, rub or gallop. ABDOMEN:  Soft, non-tender, with active bowel sounds, and no hepatosplenomegaly.  No masses. SKIN:  No rashes, ulcers or lesions. EXTREMITIES: No edema, no  skin discoloration or tenderness.  No palpable cords. LYMPH NODES: No palpable cervical, supraclavicular, axillary or inguinal adenopathy  NEUROLOGICAL: Unremarkable. PSYCH:  Appropriate.    No visits with results within 3 Day(s) from this visit.  Latest known visit with results is:  Orders Only on 03/11/2018  Component Date Value Ref Range Status  . WBC 03/11/2018 4.8  3.6 - 11.0 K/uL Final  . RBC 03/11/2018 2.89* 3.80 - 5.20 MIL/uL Final  . Hemoglobin 03/11/2018 8.1* 12.0 - 16.0 g/dL Final  . HCT 03/11/2018 24.6* 35.0 - 47.0 % Final  . MCV 03/11/2018 84.9  80.0 - 100.0 fL Final  . MCH 03/11/2018 28.1  26.0 - 34.0 pg Final  . MCHC 03/11/2018 33.1  32.0 - 36.0 g/dL Final  . RDW 03/11/2018 17.3* 11.5 - 14.5 % Final  . Platelets 03/11/2018 317  150 - 440 K/uL Final  . Neutrophils Relative % 03/11/2018 59  % Final  . Neutro Abs 03/11/2018 2.8  1.4 - 6.5 K/uL Final  . Lymphocytes Relative 03/11/2018 28  % Final  . Lymphs Abs 03/11/2018 1.4  1.0 - 3.6 K/uL Final  . Monocytes Relative 03/11/2018 8  % Final  . Monocytes Absolute 03/11/2018 0.4  0.2 - 0.9 K/uL  Final  . Eosinophils Relative 03/11/2018 3  % Final  . Eosinophils Absolute 03/11/2018 0.2  0 - 0.7 K/uL Final  . Basophils Relative 03/11/2018 2  % Final  . Basophils Absolute 03/11/2018 0.1  0 - 0.1 K/uL Final   Performed at [facility], [address] [place], [place] [address]    Assessment:  Kristen Francis is a 72 y.o. female with anemia of chronic renal disease and a monoclonal gammopathy of unknown significance (MGUS).  The patient has chronic kidney disease.  Her creatinine was 2.46 (CrCl 22 ml/min) on 12/12/2016, 2.7 (CrCl 20 ml/min) on 01/2017, and 3.21 (CrCl 14-17 ml/min) on 09/06/2017. Her hemoglobin was 8.2. She was started on oral iron.   Labs on 12/22/2016 revealed a negative SPEP.  B12 and folate were normal.  Light chains were 1.85.  UPEP was negative.  Labs on 09/06/2017 revealed an SPEP with an irregularity  in the gamma region, which may represent a monoclonal protein.  Immunofixation revealed an IgG kappa monoclonal protein.  Work-up on 09/17/2017:  Hematocrit was 25.5, hemoglobin 8.4, MCV 81.4, platelets 379,000, WBC 5100 with an ANC of 3400.  Ferritin was 25 with a saturation of 11% with a TIBC of 317.  Normal labs included:  B12, folate was 18.4, epo level (12.9),  And TSH.   Sed rate was 83.  SPEP revealed a 0.2 gm/dL IgG monoclonal protein with kappa light chain specificity.  IgG was 910 and IgA 138.  Kappa free light chains were 102.3, lambda free light chains 49.5 with a ratio of 2.07 (0.26 - 1.65).  Ferritin has been followed: 25 on 09/17/2017, 191 on 11/02/2017, 139 on 11/30/2017, 98 on 12/31/2017, 78 on 02/11/2018, and 60.7 on 04/01/2018.  She received weekly Venofer x 3 (09/28/2017 - 10/12/2017).  Began Procrit injections, every 2 weeks, on 02/11/2018 (last 03/11/2018).   Bone marrow aspiration and biopsy on 11/09/2017 revealed a normocellular marrow with mild plasmacytosis (5% CD138, 4% by aspirate).  There was mild erythroid hyperplasia.  Iron stores were present.  Flow cytometry revealed no monoclonal B cell or aberrant T cell population. Cytogenetics were normal (2, XX).   Last colonoscopy in 2016 showed "a few polyps".  Oral iron causes constipation.  Diet is "ok". She eats meat about 5 days a week. She denies ice pica. She denies bleeding (hematochezia, melena, or vaginal bleeding).   Renal biopsy on 01/25/2018 demonstrated focal segmental glomerulosclerosis (FSGS).  Symptomatically, patient is doing well. She has chronic arthritic pain in her shoulders and knees. Her legs feel "tired". She denies bleeding. Exam is stable.  Hemoglobin was 9.8 with a hematocrit of 32.4 on 04/01/2018.   Plan: 1.  Labs today:  CBC with diff, BMP, SPEP. 2.  Discuss FSGS and resulting CKD-4. She has met with [facility] transplant services. Dialysis recommended. Continue to follow up with nephrology as already  scheduled.  3.  Discuss anemia. Patient's hemoglobin was 9.8 with a ferritin of 60.7 at [facility].  Discussed with Dr. Smith Mince plan for patient to receive IV iron at [facility] and [facility].  4.  Venofer 200 mg IV today.  5.  RTC in 6 months for MD assessment and labs (CBC with diff, SPEP, BMP, ferritin).     Honor Loh, NP 04/08/2018, 11:31 AM   I saw and evaluated the patient, participating in the key portions of the service and reviewing pertinent diagnostic studies and records.  I reviewed the nurse practitioner's note and agree with the findings and  the plan.  The assessment and plan were discussed with the patient. A few questions were asked by the patient and answered.   Nolon Stalls, MD 04/08/2018,11:31 AM

## 2018-04-08 ENCOUNTER — Ambulatory Visit: Payer: Medicare Other

## 2018-04-08 ENCOUNTER — Inpatient Hospital Stay: Payer: Medicare Other | Attending: Hematology and Oncology | Admitting: Hematology and Oncology

## 2018-04-08 ENCOUNTER — Other Ambulatory Visit: Payer: Medicare Other

## 2018-04-08 ENCOUNTER — Encounter: Payer: Self-pay | Admitting: Hematology and Oncology

## 2018-04-08 ENCOUNTER — Inpatient Hospital Stay: Payer: Medicare Other

## 2018-04-08 ENCOUNTER — Other Ambulatory Visit: Payer: Self-pay | Admitting: Hematology and Oncology

## 2018-04-08 ENCOUNTER — Other Ambulatory Visit: Payer: Self-pay

## 2018-04-08 VITALS — BP 168/78 | HR 79 | Temp 97.4°F | Resp 18 | Wt 162.1 lb

## 2018-04-08 DIAGNOSIS — D472 Monoclonal gammopathy: Secondary | ICD-10-CM | POA: Diagnosis not present

## 2018-04-08 DIAGNOSIS — D631 Anemia in chronic kidney disease: Secondary | ICD-10-CM | POA: Diagnosis not present

## 2018-04-08 DIAGNOSIS — N184 Chronic kidney disease, stage 4 (severe): Secondary | ICD-10-CM

## 2018-04-08 DIAGNOSIS — E785 Hyperlipidemia, unspecified: Secondary | ICD-10-CM | POA: Diagnosis not present

## 2018-04-08 DIAGNOSIS — Z79899 Other long term (current) drug therapy: Secondary | ICD-10-CM | POA: Diagnosis not present

## 2018-04-08 DIAGNOSIS — Z8673 Personal history of transient ischemic attack (TIA), and cerebral infarction without residual deficits: Secondary | ICD-10-CM

## 2018-04-08 DIAGNOSIS — R531 Weakness: Secondary | ICD-10-CM | POA: Diagnosis not present

## 2018-04-08 DIAGNOSIS — Z87891 Personal history of nicotine dependence: Secondary | ICD-10-CM | POA: Diagnosis not present

## 2018-04-08 DIAGNOSIS — N269 Renal sclerosis, unspecified: Secondary | ICD-10-CM | POA: Insufficient documentation

## 2018-04-08 DIAGNOSIS — R351 Nocturia: Secondary | ICD-10-CM | POA: Insufficient documentation

## 2018-04-08 DIAGNOSIS — M1990 Unspecified osteoarthritis, unspecified site: Secondary | ICD-10-CM | POA: Insufficient documentation

## 2018-04-08 DIAGNOSIS — I129 Hypertensive chronic kidney disease with stage 1 through stage 4 chronic kidney disease, or unspecified chronic kidney disease: Secondary | ICD-10-CM | POA: Insufficient documentation

## 2018-04-08 MED ORDER — ALLOPURINOL 100 MG PO TABS: 100.0000 mg | ORAL_TABLET | Freq: Every day | ORAL | 0 refills | Status: DC

## 2018-04-08 NOTE — Progress Notes (Signed)
Patient offers no complaints today.  Patient needs clarification or treatment plan.

## 2018-04-08 NOTE — Telephone Encounter (Signed)
LMOM PT NEED APPT FOR FURTHER REFILLS

## 2018-05-06 DIAGNOSIS — N184 Chronic kidney disease, stage 4 (severe): Secondary | ICD-10-CM | POA: Diagnosis not present

## 2018-05-16 ENCOUNTER — Telehealth: Payer: Self-pay

## 2018-05-16 NOTE — Telephone Encounter (Signed)
Per Medicare Wellness, attempted to call patient to determine if she is still a patient at our office. If she is not our patient anymore, we asked that she inform us of her new/current PCP. Patient did not answer so a voicemail was left to request that she call us back with that information.

## 2018-06-11 DIAGNOSIS — D631 Anemia in chronic kidney disease: Secondary | ICD-10-CM | POA: Diagnosis not present

## 2018-06-11 DIAGNOSIS — N184 Chronic kidney disease, stage 4 (severe): Secondary | ICD-10-CM | POA: Diagnosis not present

## 2018-06-12 ENCOUNTER — Other Ambulatory Visit: Payer: Self-pay

## 2018-06-12 MED ORDER — ALLOPURINOL 100 MG PO TABS: 100.0000 mg | ORAL_TABLET | Freq: Every day | ORAL | 0 refills | Status: DC

## 2018-06-17 DIAGNOSIS — N184 Chronic kidney disease, stage 4 (severe): Secondary | ICD-10-CM | POA: Diagnosis not present

## 2018-06-24 ENCOUNTER — Encounter: Payer: Self-pay | Admitting: Adult Health

## 2018-06-24 ENCOUNTER — Ambulatory Visit: Payer: Medicare Other | Admitting: Adult Health

## 2018-06-24 VITALS — BP 148/86 | HR 78 | Resp 16 | Ht 66.0 in | Wt 162.8 lb

## 2018-06-24 DIAGNOSIS — N185 Chronic kidney disease, stage 5: Secondary | ICD-10-CM | POA: Diagnosis not present

## 2018-06-24 DIAGNOSIS — R609 Edema, unspecified: Secondary | ICD-10-CM | POA: Diagnosis not present

## 2018-06-24 DIAGNOSIS — R300 Dysuria: Secondary | ICD-10-CM

## 2018-06-24 DIAGNOSIS — Z1239 Encounter for other screening for malignant neoplasm of breast: Secondary | ICD-10-CM

## 2018-06-24 DIAGNOSIS — Z1231 Encounter for screening mammogram for malignant neoplasm of breast: Secondary | ICD-10-CM | POA: Diagnosis not present

## 2018-06-24 DIAGNOSIS — D631 Anemia in chronic kidney disease: Secondary | ICD-10-CM | POA: Diagnosis not present

## 2018-06-24 DIAGNOSIS — I10 Essential (primary) hypertension: Secondary | ICD-10-CM | POA: Diagnosis not present

## 2018-06-24 DIAGNOSIS — E875 Hyperkalemia: Secondary | ICD-10-CM | POA: Diagnosis not present

## 2018-06-24 DIAGNOSIS — Z0001 Encounter for general adult medical examination with abnormal findings: Secondary | ICD-10-CM | POA: Diagnosis not present

## 2018-06-24 DIAGNOSIS — N184 Chronic kidney disease, stage 4 (severe): Secondary | ICD-10-CM

## 2018-06-24 DIAGNOSIS — I120 Hypertensive chronic kidney disease with stage 5 chronic kidney disease or end stage renal disease: Secondary | ICD-10-CM | POA: Diagnosis not present

## 2018-06-24 NOTE — Progress Notes (Signed)
[place]

## 2018-06-24 NOTE — Patient Instructions (Signed)

## 2018-06-24 NOTE — Progress Notes (Signed)
[facility], [place] [address]  Internal MEDICINE  Office Visit Note  Patient Name: Kristen Francis  [id number]  [id number]  Date of Service: 06/30/2018  Chief Complaint  Patient presents with  . Annual Exam    medicare  . Medical Management of Chronic Issues    HPI Pt is here for routine health maintenance examination.  She reports a history of HTN and CKD IV.  She denies complaints at this time. She reports she will start dialysis soon.  [facility] to establish dialysis access.    Current Medication: Outpatient Encounter Medications as of 06/24/2018  Medication Sig  . allopurinol (ZYLOPRIM) 100 MG tablet Take 1 tablet (100 mg total) by mouth daily.  Marland Kitchen amLODipine (NORVASC) 10 MG tablet Take 10 mg by mouth daily.  . carvedilol (COREG) 25 MG tablet Take 25 mg by mouth 2 (two) times daily with a meal.  . cholecalciferol (VITAMIN D) 1000 units tablet Take 1,000 Units by mouth daily.  . ferrous sulfate 325 (65 FE) MG tablet Take 325 mg by mouth.  . losartan (COZAAR) 50 MG tablet Take 50 mg by mouth daily.  Marland Kitchen torsemide (DEMADEX) 5 MG tablet Take 5 mg by mouth daily.  . [DISCONTINUED] allopurinol (ZYLOPRIM) 100 MG tablet TAKE 1 TABLET BY MOUTH ONCE DAILY  . [DISCONTINUED] carvedilol (COREG) 6.25 MG tablet Take 6.25 mg by mouth 2 (two) times daily with a meal.  . [DISCONTINUED] losartan (COZAAR) 25 MG tablet Take 25 mg by mouth daily.   No facility-administered encounter medications on file as of 06/24/2018.     Surgical History: Past Surgical History:  Procedure Laterality Date  . ABDOMINAL HYSTERECTOMY    . APPENDECTOMY    . COLONOSCOPY WITH PROPOFOL N/A 01/14/2016   Procedure: COLONOSCOPY WITH PROPOFOL;  Surgeon: Manya Silvas, MD;  Location: [facility] ENDOSCOPY;  Service: Endoscopy;  Laterality: N/A;    Medical History: Past Medical History:  Diagnosis Date  . Gout   . Hyperlipemia   . Hypertension   .  Stroke [facility])     Family History: Family History  Problem Relation Age of Onset  . Cancer Mother     Review of Systems  Constitutional: Negative for chills, fatigue and unexpected weight change.  HENT: Negative for congestion, rhinorrhea, sneezing and sore throat.   Eyes: Negative for photophobia, pain and redness.  Respiratory: Negative for cough, chest tightness and shortness of breath.   Cardiovascular: Negative for chest pain and palpitations.  Gastrointestinal: Negative for abdominal pain, constipation, diarrhea, nausea and vomiting.  Endocrine: Negative.   Genitourinary: Negative for dysuria and frequency.  Musculoskeletal: Negative for arthralgias, back pain, joint swelling and neck pain.  Skin: Negative for rash.  Allergic/Immunologic: Negative.   Neurological: Negative for tremors and numbness.  Hematological: Negative for adenopathy. Does not bruise/bleed easily.  Psychiatric/Behavioral: Negative for behavioral problems and sleep disturbance. The patient is not nervous/anxious.    Vital Signs: BP (!) 148/86   Pulse 78   Resp 16   Ht 5\' 6"  (1.676 m)   Wt 162 lb 12.8 oz (73.8 kg)   SpO2 98%   BMI 26.28 kg/m   Physical Exam  Constitutional: She is oriented to person, place, and time. She appears well-developed and well-nourished. No distress.  HENT:  Head: Normocephalic and atraumatic.  Mouth/Throat: Oropharynx is clear and moist. No oropharyngeal exudate.  Eyes: Pupils are equal, round, and reactive to light. EOM are normal.  Neck: Normal range of motion. Neck supple. No JVD present. No tracheal deviation present. No thyromegaly present.  Cardiovascular: Normal rate, regular rhythm and normal heart sounds. Exam reveals no gallop and no friction rub.  No murmur heard. Pulmonary/Chest: Effort normal and breath sounds normal. No respiratory distress. She has no wheezes. She has no rales. She exhibits no tenderness.  Abdominal: Soft. There is no tenderness. There is no  guarding.  Musculoskeletal: Normal range of motion.  Lymphadenopathy:    She has no cervical adenopathy.  Neurological: She is alert and oriented to person, place, and time. No cranial nerve deficit.  Skin: Skin is warm and dry. She is not diaphoretic.  Psychiatric: She has a normal mood and affect. Her behavior is normal. Judgment and thought content normal.  Nursing note and vitals reviewed.  Pt declined Breast Exam.   LABS: Recent Results (from the past 2160 hour(s))  UA/M w/rflx Culture, Routine     Status: Abnormal   Collection Time: 06/24/18  2:04 PM  Result Value Ref Range   Specific Gravity, UA 1.015 1.005 - 1.030   pH, UA 5.0 5.0 - 7.5   Color, UA Yellow Yellow   Appearance Ur Clear Clear   Leukocytes, UA 2+ (A) Negative   Protein, UA 3+ (A) Negative/Trace   Glucose, UA Negative Negative   Ketones, UA Negative Negative   RBC, UA Trace (A) Negative   Bilirubin, UA Negative Negative   Urobilinogen, Ur 0.2 0.2 - 1.0 mg/dL   Nitrite, UA Negative Negative   Microscopic Examination See below:     Comment: Microscopic was indicated and was performed.   Urinalysis Reflex Comment     Comment: This specimen has reflexed to a Urine Culture.  Microscopic Examination     Status: Abnormal   Collection Time: 06/24/18  2:04 PM  Result Value Ref Range   WBC, UA 6-10 (A) 0 - 5 /hpf   RBC, UA 3-10 (A) 0 - 2 /hpf   Epithelial Cells (non renal) 0-10 0 - 10 /hpf   Casts None seen None seen /lpf   Mucus, UA Present Not Estab.   Bacteria, UA Many (A) None seen/Few  Urine Culture, Reflex     Status: None   Collection Time: 06/24/18  2:04 PM  Result Value Ref Range   Urine Culture, Routine Final report    Organism ID, Bacteria Comment     Comment: Greater than 2 organisms recovered, none predominant. Please submit another sample if clinically indicated. Greater than 100,000 colony forming units per mL     Assessment/Plan: 1. Hypertension, unspecified type Pt self reports  medication compliance.  BP elevated today.  Continue medications.  - CBC with Differential/Platelet Hypertension Counseling:   The following hypertensive lifestyle modification were recommended and discussed:  1. Limiting alcohol intake to less than 1 oz/day of ethanol:(24 oz of beer or 8 oz of wine or 2 oz of 100-proof whiskey). 2. Take baby ASA 81 mg daily. 3. Importance of regular aerobic exercise and losing weight. 4. Reduce dietary saturated fat and cholesterol intake for overall cardiovascular health. 5. Maintaining adequate dietary potassium, calcium, and magnesium intake. 6. Regular monitoring of the blood pressure. 7. Reduce sodium intake to less than 100 mmol/day (less than 2.3 gm of sodium or less than 6 gm of sodium choride)   2. Encounter for general adult medical examination with abnormal findings - Lipid Panel With LDL/HDL Ratio - TSH - T4, free - Comprehensive metabolic panel  3. Chronic  kidney disease (CKD), stage IV (severe) ([place]) Seeing Vascular next week for Dialysis access.  Unsure of when Dialysis treatment will begin.   4. Dysuria - UA/M w/rflx Culture, Routine  General Counseling: Lisbet verbalizes understanding of the findings of todays visit and agrees with plan of treatment. I have discussed any further diagnostic evaluation that may be needed or ordered today. We also reviewed her medications today. she has been encouraged to call the office with any questions or concerns that should arise related to todays visit.   Orders Placed This Encounter  Procedures  . Microscopic Examination  . Urine Culture, Reflex  . MM DIGITAL SCREENING BILATERAL  . UA/M w/rflx Culture, Routine  . CBC with Differential/Platelet  . Lipid Panel With LDL/HDL Ratio  . TSH  . T4, free  . Comprehensive metabolic panel     Time spent: 30 Minutes   This patient was seen by Orson Gear AGNP-C in Collaboration with Dr Lavera Guise as a part of collaborative care  agreement   Lavera Guise, MD  Internal Medicine

## 2018-06-26 LAB — URINE CULTURE, REFLEX

## 2018-06-26 LAB — UA/M W/RFLX CULTURE, ROUTINE
Bilirubin, UA: NEGATIVE
Glucose, UA: NEGATIVE
KETONES UA: NEGATIVE
Nitrite, UA: NEGATIVE
PH UA: 5 (ref 5.0–7.5)
Specific Gravity, UA: 1.015 (ref 1.005–1.030)
Urobilinogen, Ur: 0.2 mg/dL (ref 0.2–1.0)

## 2018-06-26 LAB — MICROSCOPIC EXAMINATION: Casts: NONE SEEN /lpf

## 2018-06-28 ENCOUNTER — Telehealth: Payer: Self-pay | Admitting: Internal Medicine

## 2018-06-28 ENCOUNTER — Other Ambulatory Visit: Payer: Self-pay | Admitting: Internal Medicine

## 2018-06-28 MED ORDER — NITROFURANTOIN MONOHYD MACRO 100 MG PO CAPS: 100.0000 mg | ORAL_CAPSULE | Freq: Two times a day (BID) | ORAL | 0 refills | Status: DC

## 2018-06-28 NOTE — Telephone Encounter (Signed)
Pt notified of uti and rx

## 2018-07-04 ENCOUNTER — Other Ambulatory Visit (INDEPENDENT_AMBULATORY_CARE_PROVIDER_SITE_OTHER): Payer: Self-pay | Admitting: Vascular Surgery

## 2018-07-04 ENCOUNTER — Encounter (INDEPENDENT_AMBULATORY_CARE_PROVIDER_SITE_OTHER): Payer: Self-pay | Admitting: Nurse Practitioner

## 2018-07-04 ENCOUNTER — Ambulatory Visit (INDEPENDENT_AMBULATORY_CARE_PROVIDER_SITE_OTHER): Payer: Medicare Other

## 2018-07-04 ENCOUNTER — Ambulatory Visit (INDEPENDENT_AMBULATORY_CARE_PROVIDER_SITE_OTHER): Payer: Medicare Other | Admitting: Nurse Practitioner

## 2018-07-04 VITALS — BP 146/78 | HR 75 | Resp 16 | Ht 66.0 in | Wt 155.6 lb

## 2018-07-04 DIAGNOSIS — D649 Anemia, unspecified: Secondary | ICD-10-CM

## 2018-07-04 DIAGNOSIS — N186 End stage renal disease: Secondary | ICD-10-CM | POA: Insufficient documentation

## 2018-07-04 DIAGNOSIS — N185 Chronic kidney disease, stage 5: Secondary | ICD-10-CM

## 2018-07-04 DIAGNOSIS — I10 Essential (primary) hypertension: Secondary | ICD-10-CM | POA: Diagnosis not present

## 2018-07-04 NOTE — Progress Notes (Signed)
Subjective:    Patient ID: Kristen Francis, female    DOB: 16-Aug-1946, 72 y.o.   MRN: [id number] Chief Complaint  Patient presents with  . New Patient (Initial Visit)    ref [facility] for vein mapping    HPI  The patient is seen for evaluation for dialysis access after referral by Dr. Smith Mince. The patient has chronic renal insufficiency stage V secondary to hypertension. The patient's most recent creatinine clearance is less than 20. The patient volume status has not yet become an issue. Patient's blood pressures been relatively well controlled. There are little uremic symptoms. The patient is right-handed.  The patient has been considering the various methods of dialysis and wishes to proceed with hemodialysis and therefore creation of AV access.  The patient underwent vein mapping in our office today.  However vein mapping reveals that there are no native arteries large enough to sustain AV fistula creation.  Due to this, AV graft options were discussed with the patient.  The patient denies amaurosis fugax or recent TIA symptoms. There are no recent neurological changes noted. The patient denies claudication symptoms or rest pain symptoms. The patient denies history of DVT, PE or superficial thrombophlebitis. The patient denies recent episodes of angina or shortness of breath.    Constitutional: [] Weight loss  [] Fever  [] Chills Cardiac: [] Chest pain   [] Chest pressure   [] Palpitations   [] Shortness of breath when laying flat   [] Shortness of breath with exertion. Vascular:  [] Pain in legs with walking   [] Pain in legs with standing  [] History of DVT   [] Phlebitis   [] Swelling in legs   [] Varicose veins   [] Non-healing ulcers Pulmonary:   [] Uses home oxygen   [] Productive cough   [] Hemoptysis   [] Wheeze  [] COPD   [] Asthma Neurologic:  [] Dizziness   [] Seizures   [] History of stroke   [] History of TIA  [] Aphasia   [] Vissual changes   [] Weakness or numbness in arm   [] Weakness or numbness in  leg Musculoskeletal:   [] Joint swelling   [] Joint pain   [] Low back pain Hematologic:  [] Easy bruising  [] Easy bleeding   [] Hypercoagulable state   [] Anemic Gastrointestinal:  [] Diarrhea   [] Vomiting  [] Gastroesophageal reflux/heartburn   [] Difficulty swallowing. Genitourinary:  [x] Chronic kidney disease   [] Difficult urination  [] Frequent urination   [] Blood in urine Skin:  [] Rashes   [] Ulcers  Psychological:  [] History of anxiety   []  History of major depression.     Objective:   Physical Exam  BP (!) 146/78 (BP Location: Right Arm)   Pulse 75   Resp 16   Ht 5\' 6"  (1.676 m)   Wt 155 lb 9.6 oz (70.6 kg)   BMI 25.11 kg/m   Past Medical History:  Diagnosis Date  . Gout   . Hyperlipemia   . Hypertension   . Stroke [facility])      Gen: WD/WN, NAD Head: [place]/AT, No temporalis wasting.  Ear/Nose/Throat: Hearing grossly intact, nares w/o erythema or drainage, poor dentition Eyes: PER, EOMI, sclera nonicteric.  Neck: Supple, no masses.  No bruit or JVD.  Pulmonary:  Good air movement, clear to auscultation bilaterally, no use of accessory muscles.  Cardiac: RRR, normal S1, S2, no Murmurs. Vascular: Vessel Right Left  Radial 2+ palpable 2+ palpable  Brachial 2+ palpable 2+ palpable  Gastrointestinal: soft, non-distended. No guarding/no peritoneal signs.  Musculoskeletal: M/S 5/5 throughout.  No deformity or atrophy.  Neurologic: CN 2-12 intact. Pain and light touch intact in extremities.  Symmetrical.  Speech is fluent. Motor exam as listed above. Psychiatric: Judgment intact, Mood & affect appropriate for pt's clinical situation. Dermatologic: Venous rashes no ulcers noted.  No changes consistent with cellulitis. Lymph : No Cervical lymphadenopathy, no lichenification or skin changes of chronic lymphedema.   Social History   Socioeconomic History  . Marital status: Married    Spouse name: Not on file  . Number of children: Not on file  . Years of education: Not on file  .  Highest education level: Not on file  Occupational History  . Not on file  Social Needs  . Financial resource strain: Not on file  . Food insecurity:    Worry: Not on file    Inability: Not on file  . Transportation needs:    Medical: Not on file    Non-medical: Not on file  Tobacco Use  . Smoking status: Former Research scientist (life sciences)  . Smokeless tobacco: Never Used  Substance and Sexual Activity  . Alcohol use: No    Frequency: Never  . Drug use: No  . Sexual activity: Not on file  Lifestyle  . Physical activity:    Days per week: Not on file    Minutes per session: Not on file  . Stress: Not on file  Relationships  . Social connections:    Talks on phone: Not on file    Gets together: Not on file    Attends religious service: Not on file    Active member of club or organization: Not on file    Attends meetings of clubs or organizations: Not on file    Relationship status: Not on file  . Intimate partner violence:    Fear of current or ex partner: Not on file    Emotionally abused: Not on file    Physically abused: Not on file    Forced sexual activity: Not on file  Other Topics Concern  . Not on file  Social History Narrative  . Not on file    Past Surgical History:  Procedure Laterality Date  . ABDOMINAL HYSTERECTOMY    . APPENDECTOMY    . COLONOSCOPY WITH PROPOFOL N/A 01/14/2016   Procedure: COLONOSCOPY WITH PROPOFOL;  Surgeon: Manya Silvas, MD;  Location: [facility] ENDOSCOPY;  Service: Endoscopy;  Laterality: N/A;    Family History  Problem Relation Age of Onset  . Cancer Mother     Allergies  Allergen Reactions  . Penicillins        Assessment & Plan:  The patient is seen for evaluation for dialysis access. The patient has chronic renal insufficiency stage V secondary to hypertension. The patient's most recent creatinine clearance is less than 20. The patient volume status has not yet become an issue. Patient's blood pressures been relatively well controlled. There  are little uremic symptoms. The patient is right-handed.  The patient has been considering the various methods of dialysis and wishes to proceed with hemodialysis and therefore creation of AV access.  The patient underwent vein mapping in our office today.  However vein mapping reveals that there are no native arteries large enough to sustain AV fistula creation.  Due to this, AV graft options were discussed with the patient.  The patient denies amaurosis fugax or recent TIA symptoms. There are no recent neurological changes noted. The patient denies claudication symptoms or rest pain symptoms. The patient denies history of DVT, PE or superficial thrombophlebitis. The patient denies recent episodes of angina or shortness of breath.  1. Chronic kidney disease, stage V ([place]) Recommend:  At this time the patient does not have appropriate extremity access for dialysis  Patient should have a Left upper extremity brachial-Axillary Graft created.  The risks, benefits and alternative therapies were reviewed in detail with the patient.  All questions were answered.  The patient agrees to proceed with surgery.   The patient was also advised that following surgery she would need to rest her arm for approximately a month with no heavy lifting nothing heavier than a gallon of milk.  She was also advised that following surgery she would no longer be able to have blood pressures taken on that arm, blood draws, and IVs.  .  The patient also understood that it would take 4 weeks to allow her body to mature before she could actually begin dialysis treatments.  The patient understands that if she requires dialysis before her graft matures, she may need PermCath placement.  2. Hypertension, unspecified type Well-controlled on today's visit, patient will continue to follow-up with her primary care physician management.  3. Anemia, unspecified type Patient will continue to follow with her primary care  and nephrologist for management   Current Outpatient Medications on File Prior to Visit  Medication Sig Dispense Refill  . allopurinol (ZYLOPRIM) 100 MG tablet Take 1 tablet (100 mg total) by mouth daily. 30 tablet 0  . amLODipine (NORVASC) 10 MG tablet Take 10 mg by mouth daily.    . carvedilol (COREG) 25 MG tablet Take 25 mg by mouth 2 (two) times daily with a meal.    . cholecalciferol (VITAMIN D) 1000 units tablet Take 1,000 Units by mouth daily.    . ferrous sulfate 325 (65 FE) MG tablet Take 325 mg by mouth.    . losartan (COZAAR) 50 MG tablet Take 50 mg by mouth daily.    . sodium bicarbonate 650 MG tablet Take 650 mg by mouth 2 (two) times daily.  3  . torsemide (DEMADEX) 5 MG tablet Take 5 mg by mouth daily.  11  . nitrofurantoin, macrocrystal-monohydrate, (MACROBID) 100 MG capsule Take 1 capsule (100 mg total) by mouth 2 (two) times daily. (Patient not taking: Reported on 07/04/2018) 10 capsule 0   No current facility-administered medications on file prior to visit.     There are no Patient Instructions on file for this visit. No follow-ups on file.   Kris Hartmann, NP

## 2018-07-05 DIAGNOSIS — I10 Essential (primary) hypertension: Secondary | ICD-10-CM | POA: Diagnosis not present

## 2018-07-05 DIAGNOSIS — D631 Anemia in chronic kidney disease: Secondary | ICD-10-CM | POA: Diagnosis not present

## 2018-07-05 DIAGNOSIS — N185 Chronic kidney disease, stage 5: Secondary | ICD-10-CM | POA: Diagnosis not present

## 2018-07-05 DIAGNOSIS — I120 Hypertensive chronic kidney disease with stage 5 chronic kidney disease or end stage renal disease: Secondary | ICD-10-CM | POA: Diagnosis not present

## 2018-07-10 ENCOUNTER — Other Ambulatory Visit (INDEPENDENT_AMBULATORY_CARE_PROVIDER_SITE_OTHER): Payer: Self-pay | Admitting: Nurse Practitioner

## 2018-07-10 ENCOUNTER — Encounter
Admission: RE | Admit: 2018-07-10 | Discharge: 2018-07-10 | Disposition: A | Discharge status: Home / Self Care | Payer: Medicare Other | Source: Ambulatory Visit | Attending: Vascular Surgery | Admitting: Vascular Surgery

## 2018-07-10 ENCOUNTER — Other Ambulatory Visit: Payer: Self-pay

## 2018-07-10 DIAGNOSIS — Z01810 Encounter for preprocedural cardiovascular examination: Secondary | ICD-10-CM | POA: Diagnosis not present

## 2018-07-10 DIAGNOSIS — Z8673 Personal history of transient ischemic attack (TIA), and cerebral infarction without residual deficits: Secondary | ICD-10-CM | POA: Diagnosis not present

## 2018-07-10 DIAGNOSIS — N186 End stage renal disease: Secondary | ICD-10-CM | POA: Diagnosis not present

## 2018-07-10 DIAGNOSIS — Z87891 Personal history of nicotine dependence: Secondary | ICD-10-CM | POA: Diagnosis not present

## 2018-07-10 DIAGNOSIS — I1310 Hypertensive heart and chronic kidney disease without heart failure, with stage 1 through stage 4 chronic kidney disease, or unspecified chronic kidney disease: Secondary | ICD-10-CM | POA: Diagnosis not present

## 2018-07-10 DIAGNOSIS — D631 Anemia in chronic kidney disease: Secondary | ICD-10-CM | POA: Diagnosis not present

## 2018-07-10 DIAGNOSIS — E785 Hyperlipidemia, unspecified: Secondary | ICD-10-CM | POA: Diagnosis not present

## 2018-07-10 DIAGNOSIS — Z880 Allergy status to penicillin: Secondary | ICD-10-CM | POA: Diagnosis not present

## 2018-07-10 DIAGNOSIS — Z90710 Acquired absence of both cervix and uterus: Secondary | ICD-10-CM | POA: Diagnosis not present

## 2018-07-10 DIAGNOSIS — Z79899 Other long term (current) drug therapy: Secondary | ICD-10-CM | POA: Diagnosis not present

## 2018-07-10 DIAGNOSIS — M109 Gout, unspecified: Secondary | ICD-10-CM | POA: Diagnosis not present

## 2018-07-10 DIAGNOSIS — D472 Monoclonal gammopathy: Secondary | ICD-10-CM | POA: Diagnosis not present

## 2018-07-10 HISTORY — DX: Unspecified macular degeneration: H35.30

## 2018-07-10 HISTORY — DX: Adverse effect of unspecified anesthetic, initial encounter: T41.45XA

## 2018-07-10 HISTORY — DX: Other complications of anesthesia, initial encounter: T88.59XA

## 2018-07-10 HISTORY — DX: Anemia, unspecified: D64.9

## 2018-07-10 HISTORY — DX: Chronic kidney disease, unspecified: N18.9

## 2018-07-10 HISTORY — DX: Unspecified osteoarthritis, unspecified site: M19.90

## 2018-07-10 LAB — CBC WITH DIFFERENTIAL/PLATELET
Basophils Absolute: 0.1 10*3/uL (ref 0–0.1)
Basophils Relative: 1 %
EOS PCT: 5 %
Eosinophils Absolute: 0.2 10*3/uL (ref 0–0.7)
HEMATOCRIT: 28.3 % — AB (ref 35.0–47.0)
HEMOGLOBIN: 9.2 g/dL — AB (ref 12.0–16.0)
LYMPHS ABS: 0.8 10*3/uL — AB (ref 1.0–3.6)
LYMPHS PCT: 17 %
MCH: 27.3 pg (ref 26.0–34.0)
MCHC: 32.5 g/dL (ref 32.0–36.0)
MCV: 84 fL (ref 80.0–100.0)
Monocytes Absolute: 0.3 10*3/uL (ref 0.2–0.9)
Monocytes Relative: 7 %
Neutro Abs: 3.1 10*3/uL (ref 1.4–6.5)
Neutrophils Relative %: 70 %
PLATELETS: 342 10*3/uL (ref 150–440)
RBC: 3.37 MIL/uL — AB (ref 3.80–5.20)
RDW: 19 % — ABNORMAL HIGH (ref 11.5–14.5)
WBC: 4.4 10*3/uL (ref 3.6–11.0)

## 2018-07-10 LAB — BASIC METABOLIC PANEL
Anion gap: 9 (ref 5–15)
BUN: 55 mg/dL — AB (ref 8–23)
CHLORIDE: 111 mmol/L (ref 98–111)
CO2: 20 mmol/L — ABNORMAL LOW (ref 22–32)
Calcium: 9.2 mg/dL (ref 8.9–10.3)
Creatinine, Ser: 4.62 mg/dL — ABNORMAL HIGH (ref 0.44–1.00)
GFR calc Af Amer: 10 mL/min — ABNORMAL LOW (ref >60)
GFR calc non Af Amer: 9 mL/min — ABNORMAL LOW (ref >60)
Glucose, Bld: 155 mg/dL — ABNORMAL HIGH (ref 70–99)
POTASSIUM: 4.2 mmol/L (ref 3.5–5.1)
Sodium: 140 mmol/L (ref 135–145)

## 2018-07-10 LAB — PROTIME-INR
INR: 1.04
Prothrombin Time: 13.5 seconds (ref 11.4–15.2)

## 2018-07-10 LAB — APTT: aPTT: 37 seconds — ABNORMAL HIGH (ref 24–36)

## 2018-07-10 NOTE — Patient Instructions (Signed)
Your procedure is scheduled on: July 12, 2018 Friday  Report to Day Surgery on the 2nd floor of the [place]. To find out your arrival time, please call [phone] between 1PM - 3PM on: July 11, 2018 THURSDAY  REMEMBER: Instructions that are not followed completely may result in serious medical risk, up to and including death; or upon the discretion of your surgeon and anesthesiologist your surgery may need to be rescheduled.  Do not eat food after midnight the night before surgery.  No gum chewing, lozengers or hard candies.  You may however, drink CLEAR liquids up to 2 hours before you are scheduled to arrive for your surgery. Do not drink anything within 2 hours of the start of your surgery.  Clear liquids include: - water  - apple juice without pulp - gatorade - black coffee or tea (Do NOT add milk or creamers to the coffee or tea) Do NOT drink anything that is not on this list.  Type 1 and Type 2 diabetics should only drink water.  No Alcohol for 24 hours before or after surgery.  No Smoking including e-cigarettes for 24 hours prior to surgery.  No chewable tobacco products for at least 6 hours prior to surgery.  No nicotine patches on the day of surgery.  On the morning of surgery brush your teeth with toothpaste and water, you may rinse your mouth with mouthwash if you wish. Do not swallow any toothpaste or mouthwash.  Notify your doctor if there is any change in your medical condition (cold, fever, infection).  Do not wear jewelry, make-up, hairpins, clips or nail polish.  Do not wear lotions, powders, or perfumes. You may wear NOT deodorant.  Do not shave 48 hours prior to surgery. Men may shave face and neck.  Contacts and dentures may not be worn into surgery.  Do not bring valuables to the hospital, including drivers license, insurance or credit cards.  [facility] is not responsible for any belongings or valuables.   TAKE THESE MEDICATIONS THE MORNING  OF SURGERY: AMLODIPINE ALLOPURINOL  CARVEDILOL   Use CHG Soap as directed on instruction sheet.    Stop Anti-inflammatories (NSAIDS) such as Advil, Aleve, Ibuprofen, Motrin, Naproxen, Naprosyn and Aspirin based products such as Excedrin, Goodys Powder, BC Powder. (May take Tylenol or Acetaminophen if needed.)  Stop ANY OVER THE COUNTER supplements until after surgery. (May continue Vitamin D, Vitamin B, and multivitamin.)  Wear comfortable clothing (specific to your surgery type) to the hospital.  Plan for stool softeners for home use.   If you are being discharged the day of surgery, you will not be allowed to drive home. You will need a responsible adult to drive you home and stay with you that night.   If you are taking public transportation, you will need to have a responsible adult with you. Please confirm with your physician that it is acceptable to use public transportation.   Please call [phone] if you have any questions about these instructions.

## 2018-07-11 MED ORDER — VANCOMYCIN HCL IN DEXTROSE 1-5 GM/200ML-% IV SOLN
1000.0000 mg | INTRAVENOUS | Status: AC
  Administered 2018-07-12: 1000 mg via INTRAVENOUS

## 2018-07-12 ENCOUNTER — Encounter
Admission: RE | Disposition: A | Discharge status: Home / Self Care | Payer: Self-pay | Source: Ambulatory Visit | Attending: Vascular Surgery

## 2018-07-12 ENCOUNTER — Ambulatory Visit: Payer: Medicare Other | Admitting: Anesthesiology

## 2018-07-12 ENCOUNTER — Encounter: Payer: Self-pay | Admitting: *Deleted

## 2018-07-12 ENCOUNTER — Ambulatory Visit
Admission: RE | Admit: 2018-07-12 | Discharge: 2018-07-12 | Disposition: A | Discharge status: Home / Self Care | Payer: Medicare Other | Source: Ambulatory Visit | Attending: Vascular Surgery | Admitting: Vascular Surgery

## 2018-07-12 DIAGNOSIS — I1310 Hypertensive heart and chronic kidney disease without heart failure, with stage 1 through stage 4 chronic kidney disease, or unspecified chronic kidney disease: Secondary | ICD-10-CM | POA: Diagnosis not present

## 2018-07-12 DIAGNOSIS — D631 Anemia in chronic kidney disease: Secondary | ICD-10-CM | POA: Diagnosis not present

## 2018-07-12 DIAGNOSIS — Z880 Allergy status to penicillin: Secondary | ICD-10-CM | POA: Insufficient documentation

## 2018-07-12 DIAGNOSIS — I120 Hypertensive chronic kidney disease with stage 5 chronic kidney disease or end stage renal disease: Secondary | ICD-10-CM | POA: Diagnosis not present

## 2018-07-12 DIAGNOSIS — Z79899 Other long term (current) drug therapy: Secondary | ICD-10-CM | POA: Insufficient documentation

## 2018-07-12 DIAGNOSIS — D472 Monoclonal gammopathy: Secondary | ICD-10-CM | POA: Insufficient documentation

## 2018-07-12 DIAGNOSIS — Z8673 Personal history of transient ischemic attack (TIA), and cerebral infarction without residual deficits: Secondary | ICD-10-CM | POA: Insufficient documentation

## 2018-07-12 DIAGNOSIS — N186 End stage renal disease: Secondary | ICD-10-CM | POA: Insufficient documentation

## 2018-07-12 DIAGNOSIS — Z90710 Acquired absence of both cervix and uterus: Secondary | ICD-10-CM | POA: Insufficient documentation

## 2018-07-12 DIAGNOSIS — Z87891 Personal history of nicotine dependence: Secondary | ICD-10-CM | POA: Diagnosis not present

## 2018-07-12 DIAGNOSIS — E785 Hyperlipidemia, unspecified: Secondary | ICD-10-CM | POA: Insufficient documentation

## 2018-07-12 DIAGNOSIS — M109 Gout, unspecified: Secondary | ICD-10-CM | POA: Insufficient documentation

## 2018-07-12 HISTORY — PX: AV FISTULA PLACEMENT: SHX1204

## 2018-07-12 LAB — POTASSIUM: Potassium: 4.8 mmol/L (ref 3.5–5.1)

## 2018-07-12 SURGERY — INSERTION OF ARTERIOVENOUS (AV) GORE-TEX GRAFT ARM
Anesthesia: General | Site: Arm Lower | Laterality: Left | Wound class: Clean

## 2018-07-12 MED ORDER — HEPARIN SODIUM (PORCINE) 1000 UNIT/ML IJ SOLN
INTRAMUSCULAR | Status: AC
  Filled 2018-07-12: qty 1

## 2018-07-12 MED ORDER — HEPARIN SODIUM (PORCINE) 5000 UNIT/ML IJ SOLN
INTRAMUSCULAR | Status: AC
  Filled 2018-07-12: qty 1

## 2018-07-12 MED ORDER — SUCCINYLCHOLINE CHLORIDE 20 MG/ML IJ SOLN
INTRAMUSCULAR | Status: AC
  Filled 2018-07-12: qty 1

## 2018-07-12 MED ORDER — MIDAZOLAM HCL 2 MG/2ML IJ SOLN
INTRAMUSCULAR | Status: DC | PRN
  Administered 2018-07-12: 2 mg via INTRAVENOUS

## 2018-07-12 MED ORDER — HYDROMORPHONE HCL 1 MG/ML IJ SOLN
INTRAMUSCULAR | Status: AC
  Filled 2018-07-12: qty 1

## 2018-07-12 MED ORDER — LIDOCAINE HCL (CARDIAC) PF 100 MG/5ML IV SOSY
PREFILLED_SYRINGE | INTRAVENOUS | Status: DC | PRN
  Administered 2018-07-12: 50 mg via INTRAVENOUS

## 2018-07-12 MED ORDER — DEXAMETHASONE SODIUM PHOSPHATE 10 MG/ML IJ SOLN
INTRAMUSCULAR | Status: DC | PRN
  Administered 2018-07-12: 5 mg via INTRAVENOUS

## 2018-07-12 MED ORDER — SUGAMMADEX SODIUM 200 MG/2ML IV SOLN
INTRAVENOUS | Status: AC
  Filled 2018-07-12: qty 2

## 2018-07-12 MED ORDER — ROCURONIUM BROMIDE 100 MG/10ML IV SOLN
INTRAVENOUS | Status: DC | PRN
  Administered 2018-07-12: 20 mg via INTRAVENOUS
  Administered 2018-07-12: 30 mg via INTRAVENOUS

## 2018-07-12 MED ORDER — PROPOFOL 10 MG/ML IV BOLUS
INTRAVENOUS | Status: AC
  Filled 2018-07-12: qty 20

## 2018-07-12 MED ORDER — ONDANSETRON HCL 4 MG/2ML IJ SOLN
INTRAMUSCULAR | Status: AC
  Filled 2018-07-12: qty 2

## 2018-07-12 MED ORDER — ONDANSETRON HCL 4 MG/2ML IJ SOLN
INTRAMUSCULAR | Status: DC | PRN
  Administered 2018-07-12: 4 mg via INTRAVENOUS

## 2018-07-12 MED ORDER — FENTANYL CITRATE (PF) 100 MCG/2ML IJ SOLN
INTRAMUSCULAR | Status: DC | PRN
  Administered 2018-07-12 (×2): 100 ug via INTRAVENOUS

## 2018-07-12 MED ORDER — SODIUM CHLORIDE 0.9 % IV SOLN
INTRAVENOUS | Status: DC
  Administered 2018-07-12: 12:00:00 via INTRAVENOUS

## 2018-07-12 MED ORDER — PROPOFOL 10 MG/ML IV BOLUS
INTRAVENOUS | Status: DC | PRN
  Administered 2018-07-12: 150 mg via INTRAVENOUS

## 2018-07-12 MED ORDER — VANCOMYCIN HCL IN DEXTROSE 1-5 GM/200ML-% IV SOLN
INTRAVENOUS | Status: AC
  Administered 2018-07-12: 1000 mg via INTRAVENOUS
  Filled 2018-07-12: qty 200

## 2018-07-12 MED ORDER — BUPIVACAINE HCL (PF) 0.5 % IJ SOLN
INTRAMUSCULAR | Status: AC
  Filled 2018-07-12: qty 30

## 2018-07-12 MED ORDER — EPHEDRINE SULFATE 50 MG/ML IJ SOLN
INTRAMUSCULAR | Status: DC | PRN
  Administered 2018-07-12 (×2): 10 mg via INTRAVENOUS

## 2018-07-12 MED ORDER — DEXAMETHASONE SODIUM PHOSPHATE 10 MG/ML IJ SOLN
INTRAMUSCULAR | Status: AC
  Filled 2018-07-12: qty 1

## 2018-07-12 MED ORDER — FAMOTIDINE 20 MG PO TABS
ORAL_TABLET | ORAL | Status: AC
  Administered 2018-07-12: 20 mg via ORAL
  Filled 2018-07-12: qty 1

## 2018-07-12 MED ORDER — MIDAZOLAM HCL 2 MG/2ML IJ SOLN
INTRAMUSCULAR | Status: AC
  Filled 2018-07-12: qty 2

## 2018-07-12 MED ORDER — ONDANSETRON HCL 4 MG/2ML IJ SOLN
4.0000 mg | Freq: Once | INTRAMUSCULAR | Status: AC | PRN
  Administered 2018-07-12: 4 mg via INTRAVENOUS

## 2018-07-12 MED ORDER — FENTANYL CITRATE (PF) 100 MCG/2ML IJ SOLN
INTRAMUSCULAR | Status: AC
  Administered 2018-07-12: 25 ug via INTRAVENOUS
  Filled 2018-07-12: qty 2

## 2018-07-12 MED ORDER — ROCURONIUM BROMIDE 50 MG/5ML IV SOLN
INTRAVENOUS | Status: AC
  Filled 2018-07-12: qty 1

## 2018-07-12 MED ORDER — OXYCODONE-ACETAMINOPHEN 10-325 MG PO TABS: ORAL_TABLET | ORAL | 0 refills | Status: DC

## 2018-07-12 MED ORDER — FENTANYL CITRATE (PF) 100 MCG/2ML IJ SOLN
25.0000 ug | INTRAMUSCULAR | Status: DC | PRN
  Administered 2018-07-12 (×3): 25 ug via INTRAVENOUS

## 2018-07-12 MED ORDER — FAMOTIDINE 20 MG PO TABS
20.0000 mg | ORAL_TABLET | Freq: Once | ORAL | Status: AC
  Administered 2018-07-12: 20 mg via ORAL

## 2018-07-12 MED ORDER — ONDANSETRON HCL 4 MG/2ML IJ SOLN
INTRAMUSCULAR | Status: AC
  Administered 2018-07-12: 4 mg via INTRAVENOUS
  Filled 2018-07-12: qty 2

## 2018-07-12 MED ORDER — OXYCODONE-ACETAMINOPHEN 5-325 MG PO TABS: 1.0000 | ORAL_TABLET | Freq: Four times a day (QID) | ORAL | 0 refills | Status: DC | PRN

## 2018-07-12 SURGICAL SUPPLY — 58 items
APPLIER CLIP 11 MED OPEN (CLIP)
APPLIER CLIP 9.375 SM OPEN (CLIP)
BAG COUNTER SPONGE EZ (MISCELLANEOUS) ×2 IMPLANT
BAG DECANTER FOR FLEXI CONT (MISCELLANEOUS) ×3 IMPLANT
BLADE SURG SZ11 CARB STEEL (BLADE) ×3 IMPLANT
BOOT SUTURE AID YELLOW STND (SUTURE) ×3 IMPLANT
BRUSH SCRUB EZ  4% CHG (MISCELLANEOUS) ×2
BRUSH SCRUB EZ 4% CHG (MISCELLANEOUS) ×1 IMPLANT
CANISTER SUCT 1200ML W/VALVE (MISCELLANEOUS) ×3 IMPLANT
CHLORAPREP W/TINT 26ML (MISCELLANEOUS) ×3 IMPLANT
CLIP APPLIE 11 MED OPEN (CLIP) IMPLANT
CLIP APPLIE 9.375 SM OPEN (CLIP) IMPLANT
COUNTER SPONGE BAG EZ (MISCELLANEOUS) ×1
DERMABOND ADVANCED (GAUZE/BANDAGES/DRESSINGS) ×2
DERMABOND ADVANCED .7 DNX12 (GAUZE/BANDAGES/DRESSINGS) ×1 IMPLANT
DRESSING SURGICEL FIBRLLR 1X2 (HEMOSTASIS) ×1 IMPLANT
DRSG SURGICEL FIBRILLAR 1X2 (HEMOSTASIS) ×3
ELECT CAUTERY BLADE 6.4 (BLADE) ×3 IMPLANT
ELECT REM PT RETURN 9FT ADLT (ELECTROSURGICAL) ×3
ELECTRODE REM PT RTRN 9FT ADLT (ELECTROSURGICAL) ×1 IMPLANT
GLOVE BIO SURGEON STRL SZ7 (GLOVE) ×3 IMPLANT
GLOVE INDICATOR 7.5 STRL GRN (GLOVE) ×3 IMPLANT
GLOVE SURG SYN 8.0 (GLOVE) ×3 IMPLANT
GOWN STRL REUS W/ TWL LRG LVL3 (GOWN DISPOSABLE) ×2 IMPLANT
GOWN STRL REUS W/ TWL XL LVL3 (GOWN DISPOSABLE) ×1 IMPLANT
GOWN STRL REUS W/TWL LRG LVL3 (GOWN DISPOSABLE) ×4
GOWN STRL REUS W/TWL XL LVL3 (GOWN DISPOSABLE) ×2
GRAFT PROPATEN STD WALL 4 7X45 (Vascular Products) ×3 IMPLANT
IV NS 500ML (IV SOLUTION) ×2
IV NS 500ML BAXH (IV SOLUTION) ×1 IMPLANT
KIT TURNOVER KIT A (KITS) ×3 IMPLANT
LABEL OR SOLS (LABEL) ×3 IMPLANT
LOOP RED MAXI  1X406MM (MISCELLANEOUS) ×2
LOOP VESSEL MAXI 1X406 RED (MISCELLANEOUS) ×1 IMPLANT
LOOP VESSEL MINI 0.8X406 BLUE (MISCELLANEOUS) ×2 IMPLANT
LOOPS BLUE MINI 0.8X406MM (MISCELLANEOUS) ×4
NEEDLE FILTER BLUNT 18X 1/2SAF (NEEDLE) ×2
NEEDLE FILTER BLUNT 18X1 1/2 (NEEDLE) ×1 IMPLANT
NS IRRIG 500ML POUR BTL (IV SOLUTION) ×3 IMPLANT
PACK EXTREMITY ARMC (MISCELLANEOUS) ×3 IMPLANT
PAD PREP 24X41 OB/GYN DISP (PERSONAL CARE ITEMS) ×3 IMPLANT
PUNCH SURGICAL ROTATE 2.7MM (MISCELLANEOUS) IMPLANT
STOCKINETTE STRL 4IN 9604848 (GAUZE/BANDAGES/DRESSINGS) ×3 IMPLANT
SUT GTX CV-6 30 (SUTURE) ×6 IMPLANT
SUT MNCRL+ 5-0 UNDYED PC-3 (SUTURE) ×1 IMPLANT
SUT MONOCRYL 5-0 (SUTURE) ×2
SUT PROLENE 6 0 BV (SUTURE) ×6 IMPLANT
SUT SILK 2 0 (SUTURE) ×2
SUT SILK 2 0 SH (SUTURE) ×3 IMPLANT
SUT SILK 2-0 18XBRD TIE 12 (SUTURE) ×1 IMPLANT
SUT SILK 3 0 (SUTURE) ×2
SUT SILK 3-0 18XBRD TIE 12 (SUTURE) ×1 IMPLANT
SUT SILK 4 0 (SUTURE) ×2
SUT SILK 4-0 18XBRD TIE 12 (SUTURE) ×1 IMPLANT
SUT VIC AB 3-0 SH 27 (SUTURE) ×4
SUT VIC AB 3-0 SH 27X BRD (SUTURE) ×2 IMPLANT
SYR 20CC LL (SYRINGE) ×3 IMPLANT
SYR 3ML LL SCALE MARK (SYRINGE) ×3 IMPLANT

## 2018-07-12 NOTE — H&P (Signed)
[facility] History & Physical Update  The patient was interviewed and re-examined.  The patient's previous History and Physical has been reviewed and is unchanged.  There is no change in the plan of care. We plan to proceed with the scheduled procedure.  Hortencia Pilar, MD  07/12/2018, 12:00 PM

## 2018-07-12 NOTE — Op Note (Signed)
OPERATIVE NOTE   PROCEDURE: left brachial axillary arteriovenous graft placement  PRE-OPERATIVE DIAGNOSIS: End Stage Renal Disease  POST-OPERATIVE DIAGNOSIS: End Stage Renal Disease  SURGEON: Hortencia Pilar  ASSISTANT(S): Ms. Hezzie Bump  ANESTHESIA: general  ESTIMATED BLOOD LOSS: <50 cc  FINDING(S): 2-3 mm brachial artery; 10 mm axillary vein  SPECIMEN(S):  none  INDICATIONS:   Kristen Francis is a 72 y.o. female who presents with end stage renal disease.  The patient is scheduled for left brachial axillary AV graft placement.  The patient is aware the risks include but are not limited to: bleeding, infection, steal syndrome, nerve damage, ischemic monomelic neuropathy, failure to mature, and need for additional procedures.  The patient is aware of the risks of the procedure and elects to proceed forward.  DESCRIPTION: After full informed written consent was obtained from the patient, the patient was brought back to the operating room and placed supine upon the operating table.  Prior to induction, the patient received IV antibiotics.   After obtaining adequate anesthesia, the patient was then prepped and draped in the standard fashion for a left arm access procedure.    A linear incision was then created along the medial border of the biceps muscle just proximal to the antecubital crease and the brachial artery which was exposed through. The brachial artery was then looped proximally and distally with Silastic Vesseloops. Side branches were controlled with 4-0 silk ties.  Attention was then turned to the exposure of the axillary vein. Linear incision was then created medial to the proximal portion of the biceps at the level of the anterior axillary crease. The axillary vein was exposed and again looped proximally and distally with Silastic vessel loops. Associated tributaries were also controlled with Silastic Vesseloops.  The Gore tunneler was then delivered onto  the field and a subcutaneous path was made from the arterial incision to the venous incision. A 4-7 tapered PTFE propatent graft by Simeon Craft was then pulled through the subcutaneous tunnel. The arterial 4 mm portion was then approximated to the brachial artery. Brachial artery was controlled proximally and distally with the Silastic Vesseloops. Arteriotomy was made with an 11 blade scalpel and extended with Potts scissors and a 6-0 Prolene stay suture was placed. End graft to side brachial artery anastomosis was then fashioned with running CV 6 suture. Flushing maneuvers were performed suture line was hemostatic and the graft was then assessed for proper position and ease of future cannulation. Heparinized saline was infused into the vein and the graft was clamped with a vascular clamp. With the graft pressurized it was approximated to the axillary vein in its native bed and then marked with a surgical marker. The vein was then delivered into the surgical field and controlled with the Silastic vessel loops. Venotomy was then made with an 11 blade scalpel and extended with Potts scissors and a 6-0 Prolene suture was used as stay suture. The the graft was then sewn to the vein in an end graft to side vein fashion using running CV 6 suture.  Flushing maneuvers were performed and the artery was allowed to forward and back bleed.  Flow was then established through the AV graft  There was good  thrill in the venous outflow, and there was 1+ palpable radial pulse.  At this point, I irrigated out the surgical wounds.  There was no further active bleeding.  The subcutaneous tissue was reapproximated with a running stitch of 3-0 Vicryl.  The skin was  then reapproximated with a running subcuticular stitch of 4-0 Vicryl.  The skin was then cleaned, dried, and reinforced with Dermabond.    The patient tolerated this procedure well.   COMPLICATIONS: None  CONDITION: Kristen Francis  [facility]   Office: [phone]   07/12/2018, 4:14 PM

## 2018-07-12 NOTE — Anesthesia Post-op Follow-up Note (Signed)
Anesthesia QCDR form completed.        

## 2018-07-12 NOTE — Anesthesia Preprocedure Evaluation (Addendum)
Anesthesia Evaluation  Patient identified by MRN, date of birth, ID band Patient awake    Reviewed: Allergy & Precautions, H&P , NPO status , Patient's Chart, lab work & pertinent test results, reviewed documented beta blocker date and time   History of Anesthesia Complications (+) AWARENESS UNDER ANESTHESIA and history of anesthetic complications  Airway Mallampati: II   Neck ROM: full    Dental  (+) Implants   Pulmonary neg pulmonary ROS, former smoker,    Pulmonary exam normal        Cardiovascular Exercise Tolerance: Good hypertension, negative cardio ROS Normal cardiovascular exam     Neuro/Psych Right upper and lower weakness sp stroke CVA, Residual Symptoms negative neurological ROS  negative psych ROS   GI/Hepatic negative GI ROS, Neg liver ROS,   Endo/Other  negative endocrine ROS  Renal/GU ESRFRenal disease  negative genitourinary   Musculoskeletal  (+) Arthritis ,   Abdominal   Peds  Hematology negative hematology ROS (+) anemia ,   Anesthesia Other Findings Past Medical History:   Stroke ([place])                                                 Hypertension                                                 Hyperlipemia                                                 Gout                                                       Past Surgical History:   ABDOMINAL HYSTERECTOMY                                        APPENDECTOMY                                                BMI    Body Mass Index   27.61 kg/m 2     Reproductive/Obstetrics                            Anesthesia Physical  Anesthesia Plan  ASA: III  Anesthesia Plan: General   Post-op Pain Management:    Induction:   PONV Risk Score and Plan:   Airway Management Planned: Oral ETT  Additional Equipment:   Intra-op Plan:   Post-operative Plan: Extubation in OR  Informed Consent: I have reviewed the  patients History and Physical, chart, labs and discussed the procedure including the risks, benefits and alternatives for the proposed anesthesia with the  patient or authorized representative who has indicated his/her understanding and acceptance.   Dental Advisory Given  Plan Discussed with: CRNA  Anesthesia Plan Comments:         Anesthesia Quick Evaluation

## 2018-07-12 NOTE — Transfer of Care (Signed)
Immediate Anesthesia Transfer of Care Note  Patient: Kristen Francis  Procedure(s) Performed: INSERTION OF ARTERIOVENOUS (AV) GORE-TEX GRAFT ARM ( BRACHIAL AXILLARY ) (Left Arm Lower)  Patient Location: PACU  Anesthesia Type:General  Level of Consciousness: drowsy and patient cooperative  Airway & Oxygen Therapy: Patient Spontanous Breathing and Patient connected to face mask oxygen  Post-op Assessment: Report given to RN and Post -op Vital signs reviewed and stable  Post vital signs: Reviewed and stable  Last Vitals:  Vitals Value Taken Time  BP 143/78 07/12/2018  4:05 PM  Temp 36.3 C 07/12/2018  4:05 PM  Pulse 70 07/12/2018  4:11 PM  Resp 19 07/12/2018  4:11 PM  SpO2 100 % 07/12/2018  4:11 PM  Vitals shown include unvalidated device data.  Last Pain:  Vitals:   07/12/18 1605  TempSrc:   PainSc: Asleep         Complications: No apparent anesthesia complications

## 2018-07-12 NOTE — Discharge Instructions (Signed)

## 2018-07-12 NOTE — Anesthesia Procedure Notes (Signed)
Procedure Name: Intubation Date/Time: 07/12/2018 1:59 PM Performed by: Jonna Clark, CRNA Pre-anesthesia Checklist: Patient identified, Patient being monitored, Timeout performed, Emergency Drugs available and Suction available Patient Re-evaluated:Patient Re-evaluated prior to induction Oxygen Delivery Method: Circle system utilized Preoxygenation: Pre-oxygenation with 100% oxygen Induction Type: IV induction Ventilation: Mask ventilation without difficulty Laryngoscope Size: Mac and 3 Grade View: Grade I Tube type: Oral Tube size: 7.0 mm Number of attempts: 1 Airway Equipment and Method: Stylet Placement Confirmation: ETT inserted through vocal cords under direct vision,  positive ETCO2 and breath sounds checked- equal and bilateral Secured at: 21 cm Tube secured with: Tape Dental Injury: Dental damage

## 2018-07-13 DIAGNOSIS — Z48812 Encounter for surgical aftercare following surgery on the circulatory system: Secondary | ICD-10-CM | POA: Diagnosis not present

## 2018-07-13 DIAGNOSIS — Z87891 Personal history of nicotine dependence: Secondary | ICD-10-CM | POA: Diagnosis not present

## 2018-07-13 DIAGNOSIS — I120 Hypertensive chronic kidney disease with stage 5 chronic kidney disease or end stage renal disease: Secondary | ICD-10-CM | POA: Diagnosis not present

## 2018-07-13 DIAGNOSIS — Z79891 Long term (current) use of opiate analgesic: Secondary | ICD-10-CM | POA: Diagnosis not present

## 2018-07-13 DIAGNOSIS — N185 Chronic kidney disease, stage 5: Secondary | ICD-10-CM | POA: Diagnosis not present

## 2018-07-13 NOTE — Anesthesia Postprocedure Evaluation (Signed)
Anesthesia Post Note  Patient: Kristen Francis  Procedure(s) Performed: INSERTION OF ARTERIOVENOUS (AV) GORE-TEX GRAFT ARM ( BRACHIAL AXILLARY ) (Left Arm Lower)  Patient location during evaluation: PACU Anesthesia Type: General Level of consciousness: awake and alert Pain management: pain level controlled Vital Signs Assessment: post-procedure vital signs reviewed and stable Respiratory status: spontaneous breathing, nonlabored ventilation, respiratory function stable and patient connected to nasal cannula oxygen Cardiovascular status: blood pressure returned to baseline and stable Postop Assessment: no apparent nausea or vomiting Anesthetic complications: no     Last Vitals:  Vitals:   07/12/18 1707 07/12/18 1721  BP: (!) 143/65 130/63  Pulse: 67 65  Resp: 14 16  Temp: 36.4 C   SpO2: 100% 100%    Last Pain:  Vitals:   07/12/18 1721  TempSrc:   PainSc: 0-No pain                 Martha Clan

## 2018-07-14 ENCOUNTER — Encounter: Payer: Self-pay | Admitting: Vascular Surgery

## 2018-07-15 DIAGNOSIS — Z87891 Personal history of nicotine dependence: Secondary | ICD-10-CM | POA: Diagnosis not present

## 2018-07-15 DIAGNOSIS — D631 Anemia in chronic kidney disease: Secondary | ICD-10-CM | POA: Diagnosis not present

## 2018-07-15 DIAGNOSIS — Z79891 Long term (current) use of opiate analgesic: Secondary | ICD-10-CM | POA: Diagnosis not present

## 2018-07-15 DIAGNOSIS — E872 Acidosis: Secondary | ICD-10-CM | POA: Diagnosis not present

## 2018-07-15 DIAGNOSIS — N185 Chronic kidney disease, stage 5: Secondary | ICD-10-CM | POA: Diagnosis not present

## 2018-07-15 DIAGNOSIS — I120 Hypertensive chronic kidney disease with stage 5 chronic kidney disease or end stage renal disease: Secondary | ICD-10-CM | POA: Diagnosis not present

## 2018-07-15 DIAGNOSIS — Z48812 Encounter for surgical aftercare following surgery on the circulatory system: Secondary | ICD-10-CM | POA: Diagnosis not present

## 2018-07-16 ENCOUNTER — Encounter: Payer: Self-pay | Admitting: Vascular Surgery

## 2018-07-19 ENCOUNTER — Other Ambulatory Visit: Payer: Self-pay | Admitting: Internal Medicine

## 2018-07-19 ENCOUNTER — Other Ambulatory Visit: Payer: Self-pay | Admitting: Nurse Practitioner

## 2018-07-19 MED ORDER — ALLOPURINOL 100 MG PO TABS: 100.0000 mg | ORAL_TABLET | Freq: Every day | ORAL | 0 refills | Status: DC

## 2018-07-22 DIAGNOSIS — Z48812 Encounter for surgical aftercare following surgery on the circulatory system: Secondary | ICD-10-CM | POA: Diagnosis not present

## 2018-07-22 DIAGNOSIS — Z79891 Long term (current) use of opiate analgesic: Secondary | ICD-10-CM | POA: Diagnosis not present

## 2018-07-22 DIAGNOSIS — N185 Chronic kidney disease, stage 5: Secondary | ICD-10-CM | POA: Diagnosis not present

## 2018-07-22 DIAGNOSIS — Z87891 Personal history of nicotine dependence: Secondary | ICD-10-CM | POA: Diagnosis not present

## 2018-07-22 DIAGNOSIS — I120 Hypertensive chronic kidney disease with stage 5 chronic kidney disease or end stage renal disease: Secondary | ICD-10-CM | POA: Diagnosis not present

## 2018-07-26 ENCOUNTER — Other Ambulatory Visit (INDEPENDENT_AMBULATORY_CARE_PROVIDER_SITE_OTHER): Payer: Self-pay | Admitting: Vascular Surgery

## 2018-07-26 DIAGNOSIS — N186 End stage renal disease: Secondary | ICD-10-CM

## 2018-07-29 ENCOUNTER — Ambulatory Visit (INDEPENDENT_AMBULATORY_CARE_PROVIDER_SITE_OTHER): Payer: Medicare Other

## 2018-07-29 ENCOUNTER — Ambulatory Visit (INDEPENDENT_AMBULATORY_CARE_PROVIDER_SITE_OTHER): Payer: Medicare Other | Admitting: Vascular Surgery

## 2018-07-29 ENCOUNTER — Encounter (INDEPENDENT_AMBULATORY_CARE_PROVIDER_SITE_OTHER): Payer: Self-pay | Admitting: Vascular Surgery

## 2018-07-29 VITALS — BP 139/72 | HR 76 | Resp 13 | Ht 66.0 in | Wt 159.0 lb

## 2018-07-29 DIAGNOSIS — N186 End stage renal disease: Secondary | ICD-10-CM | POA: Diagnosis not present

## 2018-07-29 DIAGNOSIS — D631 Anemia in chronic kidney disease: Secondary | ICD-10-CM

## 2018-07-29 DIAGNOSIS — N185 Chronic kidney disease, stage 5: Secondary | ICD-10-CM

## 2018-07-29 DIAGNOSIS — N184 Chronic kidney disease, stage 4 (severe): Secondary | ICD-10-CM

## 2018-07-29 NOTE — Progress Notes (Signed)
Subjective:    Patient ID: Kristen Francis, female    DOB: Nov 26, 1946, 72 y.o.   MRN: [id number] Chief Complaint  Patient presents with  . Follow-up    2 week HDA follow up   Patient presents for her first postoperative follow-up.  The patient is status post a creation of a left brachial axillary graft on July 12, 2018.  The patient reports an unremarkable postoperative course.  The patient denies any left upper extremity pain.  The patient denies any issues with her incisions.  The patient states that she has not started dialysis yet.  Patient states that she was told by her nephrologist she does not know when she may start dialysis as her numbers are improving.  I had a long discussion with the patient about the need to check for a thrill in her graft on a daily basis.  I showed the patient had a do this through the right hand or using a stethoscope.  The patient understands that there is a higher risk with clotting of the graft when not in use.  The patient knows to call our office immediately if she does not feel a thrill or hear a bruit in her left upper extremity graft.  The patient underwent a left upper extremity dialysis access duplex which was notable for a total flow volume of 3147.  The patient's left brachial axillary graft is patent without any significant areas of stenosis.  Left distal radial artery 48 PSV.  Denies any left hand pain or ulceration.  Patient denies any fever, nausea or vomiting.  Review of Systems  Constitutional: Negative.   HENT: Negative.   Eyes: Negative.   Respiratory: Negative.   Cardiovascular: Negative.   Gastrointestinal: Negative.   Endocrine: Negative.   Genitourinary:       Chronic kidney disease  Musculoskeletal: Negative.   Skin: Negative.   Allergic/Immunologic: Negative.   Neurological: Negative.   Hematological: Negative.   Psychiatric/Behavioral: Negative.       Objective:   Physical Exam  Constitutional: She is oriented to  person, place, and time. She appears well-developed and well-nourished. No distress.  HENT:  Head: Normocephalic and atraumatic.  Right Ear: External ear normal.  Left Ear: External ear normal.  Eyes: Pupils are equal, round, and reactive to light. Conjunctivae and EOM are normal.  Neck: Normal range of motion.  Cardiovascular: Normal rate, regular rhythm, normal heart sounds and intact distal pulses.  Pulses:      Radial pulses are 2+ on the right side, and 2+ on the left side.  The upper extremity brachial axillary graft: Bruit and thrill.  Arm is healing well.  Incisions are healing well.  Pulmonary/Chest: Effort normal and breath sounds normal.  Musculoskeletal: Normal range of motion. She exhibits no edema.  Neurological: She is alert and oriented to person, place, and time.  Skin: Skin is warm and dry. She is not diaphoretic.  Psychiatric: She has a normal mood and affect. Her behavior is normal. Judgment and thought content normal.  Vitals reviewed.  BP 139/72 (BP Location: Right Arm, Patient Position: Sitting)   Pulse 76   Resp 13   Ht 5\' 6"  (1.676 m)   Wt 159 lb (72.1 kg)   BMI 25.66 kg/m   Past Medical History:  Diagnosis Date  . Anemia   . Arthritis   . Chronic kidney disease    ESRD  . Complication of anesthesia    had procedure and felt incision early 80s  .  Gout   . Hyperlipemia   . Hypertension   . Macular degeneration, right eye   . Stroke [facility])    Social History   Socioeconomic History  . Marital status: Married    Spouse name: Not on file  . Number of children: Not on file  . Years of education: Not on file  . Highest education level: Not on file  Occupational History  . Not on file  Social Needs  . Financial resource strain: Not on file  . Food insecurity:    Worry: Not on file    Inability: Not on file  . Transportation needs:    Medical: Not on file    Non-medical: Not on file  Tobacco Use  . Smoking status: Former Research scientist (life sciences)  . Smokeless  tobacco: Never Used  Substance and Sexual Activity  . Alcohol use: No    Frequency: Never  . Drug use: No  . Sexual activity: Not on file  Lifestyle  . Physical activity:    Days per week: Not on file    Minutes per session: Not on file  . Stress: Not on file  Relationships  . Social connections:    Talks on phone: Not on file    Gets together: Not on file    Attends religious service: Not on file    Active member of club or organization: Not on file    Attends meetings of clubs or organizations: Not on file    Relationship status: Not on file  . Intimate partner violence:    Fear of current or ex partner: Not on file    Emotionally abused: Not on file    Physically abused: Not on file    Forced sexual activity: Not on file  Other Topics Concern  . Not on file  Social History Narrative  . Not on file   Past Surgical History:  Procedure Laterality Date  . ABDOMINAL HYSTERECTOMY    . APPENDECTOMY    . AV FISTULA PLACEMENT Left 07/12/2018   Procedure: INSERTION OF ARTERIOVENOUS (AV) GORE-TEX GRAFT ARM ( BRACHIAL AXILLARY );  Surgeon: Katha Cabal, MD;  Location: [facility] ORS;  Service: Vascular;  Laterality: Left;  . COLONOSCOPY WITH PROPOFOL N/A 01/14/2016   Procedure: COLONOSCOPY WITH PROPOFOL;  Surgeon: Manya Silvas, MD;  Location: [facility] ENDOSCOPY;  Service: Endoscopy;  Laterality: N/A;  . TONSILLECTOMY     Family History  Problem Relation Age of Onset  . Cancer Mother    Allergies  Allergen Reactions  . Other Other (See Comments)    Pt does not receive Blood   . Penicillins Hives    Has patient had a PCN reaction causing immediate rash, facial/tongue/throat swelling, SOB or lightheadedness with hypotension: YES Has patient had a PCN reaction causing severe rash involving mucus membranes or skin necrosis: no Has patient had a PCN reaction that required hospitalization: no Has patient had a PCN reaction occurring within the last 10 years: no If all of the above  answers are "NO", then may proceed with Cephalosporin use.   . Sodium Bicarbonate Itching      Assessment & Plan:  Patient presents for her first postoperative follow-up.  The patient is status post a creation of a left brachial axillary graft on July 12, 2018.  The patient reports an unremarkable postoperative course.  The patient denies any left upper extremity pain.  The patient denies any issues with her incisions.  The patient states that she has not started dialysis  yet.  Patient states that she was told by her nephrologist she does not know when she may start dialysis as her numbers are improving.  I had a long discussion with the patient about the need to check for a thrill in her graft on a daily basis.  I showed the patient had a do this through the right hand or using a stethoscope.  The patient understands that there is a higher risk with clotting of the graft when not in use.  The patient knows to call our office immediately if she does not feel a thrill or hear a bruit in her left upper extremity graft.  The patient underwent a left upper extremity dialysis access duplex which was notable for a total flow volume of 3147.  The patient's left brachial axillary graft is patent without any significant areas of stenosis.  Left distal radial artery 48 PSV.  Denies any left hand pain or ulceration.  Patient denies any fever, nausea or vomiting.  1. Chronic kidney disease, stage V ([place]) - Stable The patient is status post creation of a left brachial axillary graft on July 12, 2018 Patient states today that she has not started dialysis as of yet.  The patient notes that her nephrologist said she may not have to start dialysis in the near future as her "numbers are improving". A long conversation with the patient about checking for thrill or bruit on a daily basis Showed the patient had a do each. She understands that having a graft that is not cannulized /for dialysis is a slightly higher chance  of thrombosing.  Is why she must check her graft on a daily basis The patient expresses her understanding and knows to call the office if she does not feel a bruit or thrill The patient does need to start dialysis in the near future her graft is ready to use. Patient to follow-up in 6 months for a routine HDA  - VAS US DUPLEX DIALYSIS ACCESS (AVF,AVG); Future  2. Anemia in stage 4 chronic kidney disease ([place]) - Stable Patient presents today asymptomatically Followed by the patient's primary care doctor and nephrologist  Current Outpatient Medications on File Prior to Visit  Medication Sig Dispense Refill  . allopurinol (ZYLOPRIM) 100 MG tablet Take 1 tablet (100 mg total) by mouth daily. 30 tablet 0  . amLODipine (NORVASC) 10 MG tablet TAKE 1 TABLET BY MOUTH ONCE DAILY 30 tablet 8  . carvedilol (COREG) 25 MG tablet Take 25 mg by mouth 2 (two) times daily with a meal.    . cholecalciferol (VITAMIN D) 1000 units tablet Take 1,000 Units by mouth daily.     . ferrous sulfate 325 (65 FE) MG tablet Take 325 mg by mouth daily.     Marland Kitchen losartan (COZAAR) 50 MG tablet Take 50 mg by mouth daily.    . sevelamer carbonate (RENVELA) 800 MG tablet Take by mouth.    . torsemide (DEMADEX) 5 MG tablet Take 5 mg by mouth 2 (two) times daily.   11  . oxyCODONE-acetaminophen (PERCOCET) 5-325 MG tablet Take 1-2 tablets by mouth every 6 (six) hours as needed for up to 14 days for severe pain. 40 tablet 0   No current facility-administered medications on file prior to visit.    There are no Patient Instructions on file for this visit. No follow-ups on file.  KIMBERLY A STEGMAYER, PA-C

## 2018-08-12 DIAGNOSIS — N185 Chronic kidney disease, stage 5: Secondary | ICD-10-CM | POA: Diagnosis not present

## 2018-08-12 DIAGNOSIS — B1910 Unspecified viral hepatitis B without hepatic coma: Secondary | ICD-10-CM | POA: Diagnosis not present

## 2018-08-12 DIAGNOSIS — Z1159 Encounter for screening for other viral diseases: Secondary | ICD-10-CM | POA: Diagnosis not present

## 2018-08-13 ENCOUNTER — Telehealth: Payer: Self-pay | Admitting: *Deleted

## 2018-08-13 DIAGNOSIS — E875 Hyperkalemia: Secondary | ICD-10-CM | POA: Diagnosis not present

## 2018-08-13 DIAGNOSIS — E872 Acidosis: Secondary | ICD-10-CM | POA: Diagnosis not present

## 2018-08-13 DIAGNOSIS — I120 Hypertensive chronic kidney disease with stage 5 chronic kidney disease or end stage renal disease: Secondary | ICD-10-CM | POA: Diagnosis not present

## 2018-08-13 DIAGNOSIS — N185 Chronic kidney disease, stage 5: Secondary | ICD-10-CM | POA: Diagnosis not present

## 2018-08-13 DIAGNOSIS — D631 Anemia in chronic kidney disease: Secondary | ICD-10-CM | POA: Diagnosis not present

## 2018-08-13 NOTE — Telephone Encounter (Signed)
She is low. Ferritin is 30. Hgb 7.9. We can do weekly Venofer x 2 infusions and go from there.

## 2018-08-13 NOTE — Telephone Encounter (Signed)
Patient called stating that Dr Smith Mince wants [place] to give her Fe infusions. Please advise

## 2018-08-20 ENCOUNTER — Inpatient Hospital Stay: Payer: Medicare Other | Attending: Hematology and Oncology

## 2018-08-20 VITALS — BP 131/61 | HR 69 | Temp 97.6°F | Resp 18

## 2018-08-20 DIAGNOSIS — N184 Chronic kidney disease, stage 4 (severe): Secondary | ICD-10-CM | POA: Diagnosis not present

## 2018-08-20 DIAGNOSIS — D509 Iron deficiency anemia, unspecified: Secondary | ICD-10-CM | POA: Diagnosis not present

## 2018-08-20 DIAGNOSIS — D472 Monoclonal gammopathy: Secondary | ICD-10-CM | POA: Insufficient documentation

## 2018-08-20 DIAGNOSIS — I129 Hypertensive chronic kidney disease with stage 1 through stage 4 chronic kidney disease, or unspecified chronic kidney disease: Secondary | ICD-10-CM | POA: Insufficient documentation

## 2018-08-20 DIAGNOSIS — D649 Anemia, unspecified: Secondary | ICD-10-CM

## 2018-08-20 DIAGNOSIS — D631 Anemia in chronic kidney disease: Secondary | ICD-10-CM | POA: Insufficient documentation

## 2018-08-20 MED ORDER — IRON SUCROSE 20 MG/ML IV SOLN
200.0000 mg | Freq: Once | INTRAVENOUS | Status: AC
  Administered 2018-08-20: 200 mg via INTRAVENOUS
  Filled 2018-08-20: qty 10

## 2018-08-20 MED ORDER — SODIUM CHLORIDE 0.9 % IV SOLN: 200.0000 mg | Freq: Once | INTRAVENOUS | Status: DC

## 2018-08-20 MED ORDER — SODIUM CHLORIDE 0.9 % IV SOLN
Freq: Once | INTRAVENOUS | Status: AC
  Administered 2018-08-20: 13:00:00 via INTRAVENOUS
  Filled 2018-08-20: qty 250

## 2018-08-23 ENCOUNTER — Other Ambulatory Visit: Payer: Self-pay

## 2018-08-23 ENCOUNTER — Emergency Department: Payer: Medicare Other

## 2018-08-23 ENCOUNTER — Emergency Department
Admission: EM | Admit: 2018-08-23 | Discharge: 2018-08-23 | Disposition: A | Discharge status: Home / Self Care | Payer: Medicare Other | Attending: Emergency Medicine | Admitting: Emergency Medicine

## 2018-08-23 ENCOUNTER — Encounter: Payer: Self-pay | Admitting: Emergency Medicine

## 2018-08-23 ENCOUNTER — Telehealth: Payer: Self-pay | Admitting: Nurse Practitioner

## 2018-08-23 DIAGNOSIS — R079 Chest pain, unspecified: Secondary | ICD-10-CM | POA: Diagnosis present

## 2018-08-23 DIAGNOSIS — Z8673 Personal history of transient ischemic attack (TIA), and cerebral infarction without residual deficits: Secondary | ICD-10-CM | POA: Insufficient documentation

## 2018-08-23 DIAGNOSIS — N186 End stage renal disease: Secondary | ICD-10-CM | POA: Insufficient documentation

## 2018-08-23 DIAGNOSIS — M542 Cervicalgia: Secondary | ICD-10-CM | POA: Insufficient documentation

## 2018-08-23 DIAGNOSIS — R42 Dizziness and giddiness: Secondary | ICD-10-CM | POA: Diagnosis not present

## 2018-08-23 DIAGNOSIS — Z79899 Other long term (current) drug therapy: Secondary | ICD-10-CM | POA: Diagnosis not present

## 2018-08-23 DIAGNOSIS — Z992 Dependence on renal dialysis: Secondary | ICD-10-CM | POA: Insufficient documentation

## 2018-08-23 DIAGNOSIS — I700 Atherosclerosis of aorta: Secondary | ICD-10-CM | POA: Insufficient documentation

## 2018-08-23 DIAGNOSIS — Z87891 Personal history of nicotine dependence: Secondary | ICD-10-CM | POA: Insufficient documentation

## 2018-08-23 DIAGNOSIS — I120 Hypertensive chronic kidney disease with stage 5 chronic kidney disease or end stage renal disease: Secondary | ICD-10-CM | POA: Diagnosis not present

## 2018-08-23 DIAGNOSIS — J90 Pleural effusion, not elsewhere classified: Secondary | ICD-10-CM | POA: Diagnosis not present

## 2018-08-23 LAB — CBC
HCT: 25.4 % — ABNORMAL LOW (ref 35.0–47.0)
Hemoglobin: 8.2 g/dL — ABNORMAL LOW (ref 12.0–16.0)
MCH: 27.2 pg (ref 26.0–34.0)
MCHC: 32.4 g/dL (ref 32.0–36.0)
MCV: 84 fL (ref 80.0–100.0)
Platelets: 298 10*3/uL (ref 150–440)
RBC: 3.02 MIL/uL — ABNORMAL LOW (ref 3.80–5.20)
RDW: 18.4 % — AB (ref 11.5–14.5)
WBC: 9.6 10*3/uL (ref 3.6–11.0)

## 2018-08-23 LAB — BASIC METABOLIC PANEL
ANION GAP: 10 (ref 5–15)
BUN: 55 mg/dL — AB (ref 8–23)
CALCIUM: 9.2 mg/dL (ref 8.9–10.3)
CO2: 22 mmol/L (ref 22–32)
Chloride: 109 mmol/L (ref 98–111)
Creatinine, Ser: 5.25 mg/dL — ABNORMAL HIGH (ref 0.44–1.00)
GFR calc Af Amer: 9 mL/min — ABNORMAL LOW (ref >60)
GFR, EST NON AFRICAN AMERICAN: 7 mL/min — AB (ref >60)
GLUCOSE: 128 mg/dL — AB (ref 70–99)
POTASSIUM: 4 mmol/L (ref 3.5–5.1)
SODIUM: 141 mmol/L (ref 135–145)

## 2018-08-23 LAB — TROPONIN I

## 2018-08-23 MED ORDER — ACETAMINOPHEN 500 MG PO TABS
1000.0000 mg | ORAL_TABLET | Freq: Once | ORAL | Status: AC
  Administered 2018-08-23: 1000 mg via ORAL
  Filled 2018-08-23: qty 2

## 2018-08-23 MED ORDER — DIAZEPAM 2 MG PO TABS
2.0000 mg | ORAL_TABLET | Freq: Once | ORAL | Status: AC
  Administered 2018-08-23: 2 mg via ORAL
  Filled 2018-08-23: qty 1

## 2018-08-23 MED ORDER — IOHEXOL 350 MG/ML SOLN
75.0000 mL | Freq: Once | INTRAVENOUS | Status: AC | PRN
  Administered 2018-08-23: 60 mL via INTRAVENOUS

## 2018-08-23 MED ORDER — DIAZEPAM 2 MG PO TABS: 2.0000 mg | ORAL_TABLET | Freq: Three times a day (TID) | ORAL | 0 refills | Status: DC | PRN

## 2018-08-23 MED ORDER — SODIUM CHLORIDE 0.9 % IV SOLN
INTRAVENOUS | Status: AC
  Administered 2018-08-23: 18:00:00 via INTRAVENOUS

## 2018-08-23 MED ORDER — SODIUM CHLORIDE 0.9 % IV BOLUS
1000.0000 mL | Freq: Once | INTRAVENOUS | Status: AC
  Administered 2018-08-23: 1000 mL via INTRAVENOUS

## 2018-08-23 MED ORDER — ALLOPURINOL 100 MG PO TABS: 100.0000 mg | ORAL_TABLET | Freq: Every day | ORAL | 1 refills | Status: DC

## 2018-08-23 NOTE — ED Provider Notes (Signed)
[facility] Emergency Department Provider Note  ____________________________________________  Time seen: Approximately 4:41 PM  I have reviewed the triage vital signs and the nursing notes.   HISTORY  Chief Complaint Chest Pain   HPI Kristen Francis is a 72 y.o. female the history of chronic kidney disease, hypertension, hyperlipidemia, CVA who presents for evaluation of neck pain. Patient reports that she woke up 2 days ago with pain located in the left side of her neck which is sharp, constant, worse with movement of the neck. She reports that she has been having intermittent episodes of vertigo associated with the neck pain which are short lived. She denies slurred speech, changes in vision, headache, facial droop, weakness or numbness of her extremities. She also denies chest pain even though triage note states that she was complaining of chest pain. She hasn't tried anything at home for this pain.  Past Medical History:  Diagnosis Date  . Anemia   . Arthritis   . Chronic kidney disease    ESRD  . Complication of anesthesia    had procedure and felt incision early 80s  . Gout   . Hyperlipemia   . Hypertension   . Macular degeneration, right eye   . Stroke [facility])     Patient Active Problem List   Diagnosis Date Noted  . Chronic kidney disease, stage V ([place]) 07/04/2018  . Anemia in chronic renal disease 12/31/2017  . Hypertension 12/31/2017  . Stroke ([place]) 12/31/2017  . Anemia 09/17/2017  . Monoclonal gammopathy of unknown significance (MGUS) 09/17/2017    Past Surgical History:  Procedure Laterality Date  . ABDOMINAL HYSTERECTOMY    . APPENDECTOMY    . AV FISTULA PLACEMENT Left 07/12/2018   Procedure: INSERTION OF ARTERIOVENOUS (AV) GORE-TEX GRAFT ARM ( BRACHIAL AXILLARY );  Surgeon: Katha Cabal, MD;  Location: [facility] ORS;  Service: Vascular;  Laterality: Left;  . COLONOSCOPY WITH PROPOFOL N/A 01/14/2016   Procedure: COLONOSCOPY  WITH PROPOFOL;  Surgeon: Manya Silvas, MD;  Location: [facility] ENDOSCOPY;  Service: Endoscopy;  Laterality: N/A;  . TONSILLECTOMY      Prior to Admission medications   Medication Sig Start Date End Date Taking? Authorizing Provider  allopurinol (ZYLOPRIM) 100 MG tablet Take 1 tablet (100 mg total) by mouth daily. 08/23/18   Ronnell Freshwater, NP  amLODipine (NORVASC) 10 MG tablet TAKE 1 TABLET BY MOUTH ONCE DAILY 07/19/18   Ronnell Freshwater, NP  carvedilol (COREG) 25 MG tablet Take 25 mg by mouth 2 (two) times daily with a meal.    [provider]  cholecalciferol (VITAMIN D) 1000 units tablet Take 1,000 Units by mouth daily.     [provider]  diazepam (VALIUM) 2 MG tablet Take 1 tablet (2 mg total) by mouth every 8 (eight) hours as needed for muscle spasms. 08/23/18 08/23/19  Rudene Re, MD  ferrous sulfate 325 (65 FE) MG tablet Take 325 mg by mouth daily.     [provider]  losartan (COZAAR) 50 MG tablet Take 50 mg by mouth daily.    [provider]  oxyCODONE-acetaminophen (PERCOCET) 5-325 MG tablet Take 1-2 tablets by mouth every 6 (six) hours as needed for up to 14 days for severe pain. 07/12/18 07/26/18  Schnier, Dolores Lory, MD  sevelamer carbonate (RENVELA) 800 MG tablet Take by mouth. 07/15/18 07/15/19  [provider]  torsemide (DEMADEX) 5 MG tablet Take 5 mg by mouth 2 (two) times daily.  03/16/18  [provider]    Allergies Other; Penicillins; and Sodium bicarbonate  Family History  Problem Relation Age of Onset  . Cancer Mother     Social History Social History   Tobacco Use  . Smoking status: Former Research scientist (life sciences)  . Smokeless tobacco: Never Used  Substance Use Topics  . Alcohol use: No    Frequency: Never  . Drug use: No    Review of Systems  Constitutional: Negative for fever. Eyes: Negative for visual changes. ENT: Negative for sore throat. Neck: + neck pain  Cardiovascular: Negative for chest  pain. Respiratory: Negative for shortness of breath. Gastrointestinal: Negative for abdominal pain, vomiting or diarrhea. Genitourinary: Negative for dysuria. Musculoskeletal: Negative for back pain. Skin: Negative for rash. Neurological: Negative for headaches, weakness or numbness. + vertigo Psych: No SI or HI  ____________________________________________   PHYSICAL EXAM:  VITAL SIGNS: ED Triage Vitals  Enc Vitals Group     BP 08/23/18 1113 (!) 152/76     Pulse Rate 08/23/18 1113 87     Resp 08/23/18 1113 18     Temp 08/23/18 1113 98.6 F (37 C)     Temp Source 08/23/18 1113 Oral     SpO2 08/23/18 1113 100 %     Weight 08/23/18 1112 160 lb (72.6 kg)     Height 08/23/18 1112 5\' 6"  (1.676 m)     Head Circumference --      Peak Flow --      Pain Score 08/23/18 1112 7     Pain Loc --      Pain Edu? --      Excl. in [place]? --     Constitutional: Alert and oriented. Well appearing and in no apparent distress. HEENT:      Head: Normocephalic and atraumatic.         Eyes: Conjunctivae are normal. Sclera is non-icteric.       Mouth/Throat: Mucous membranes are moist.       Neck: Supple with no signs of meningismus. Tender to palpation over the left lateral neck muscles, no bruit Cardiovascular: Regular rate and rhythm. No murmurs, gallops, or rubs. 2+ symmetrical distal pulses are present in all extremities. No JVD. Respiratory: Normal respiratory effort. Lungs are clear to auscultation bilaterally. No wheezes, crackles, or rhonchi.  Gastrointestinal: Soft, non tender, and non distended with positive bowel sounds. No rebound or guarding. Musculoskeletal: Nontender with normal range of motion in all extremities. No edema, cyanosis, or erythema of extremities. Neurologic: Normal speech and language. A & O x3, PERRL, EOMI, no nystagmus, CN II-XII intact, motor testing reveals good tone and bulk throughout. There is no evidence of pronator drift or dysmetria. Muscle strength is 5/5  throughout. Deep tendon reflexes are 2+ throughout with downgoing toes. Sensory examination is intact. Gait is intact Skin: Skin is warm, dry and intact. No rash noted. Psychiatric: Mood and affect are normal. Speech and behavior are normal.  ____________________________________________   LABS (all labs ordered are listed, but only abnormal results are displayed)  Labs Reviewed  BASIC METABOLIC PANEL - Abnormal; Notable for the following components:      Result Value   Glucose, Bld 128 (*)    BUN 55 (*)    Creatinine, Ser 5.25 (*)    GFR calc non Af Amer 7 (*)    GFR calc Af Amer 9 (*)    All other components within normal limits  CBC - Abnormal; Notable for the following components:   RBC 3.02 (*)  Hemoglobin 8.2 (*)    HCT 25.4 (*)    RDW 18.4 (*)    All other components within normal limits  TROPONIN I  TROPONIN I   ____________________________________________  EKG  ED ECG REPORT I, Rudene Re, the attending physician, personally viewed and interpreted this ECG.  normal sinus rhythm, rate of 86, normal intervals, normal axis, no ST elevations or depressions. Normal EKG.  ____________________________________________  RADIOLOGY  I have personally reviewed the images performed during this visit and I agree with the Radiologist's read.   Interpretation by Radiologist:  Ct Angio Head W Or Wo Contrast  Result Date: 08/23/2018 CLINICAL DATA:  LEFT-sided neck pain for 2 days. Concern for dissection. EXAM: CT ANGIOGRAPHY HEAD AND NECK TECHNIQUE: Multidetector CT imaging of the head and neck was performed using the standard protocol during bolus administration of intravenous contrast. Multiplanar CT image reconstructions and MIPs were obtained to evaluate the vascular anatomy. Carotid stenosis measurements (when applicable) are obtained utilizing NASCET criteria, using the distal internal carotid diameter as the denominator. CONTRAST:  103mL OMNIPAQUE IOHEXOL 350 MG/ML  SOLN COMPARISON:  None. FINDINGS: CT HEAD FINDINGS Brain: No evidence for acute infarction, hemorrhage, mass lesion, hydrocephalus, or extra-axial fluid. Mild cerebral and cerebellar atrophy. Old RIGHT MCA infarct affects the lentiform nucleus. Additional periventricular infarct is seen dorsal and anterior to the caudate nucleus on the LEFT. Vascular: Reported separately. Skull: Intact. Sinuses: No layering fluid. Orbits: Dense lenticular opacities.  No masses. Review of the MIP images confirms the above findings CTA NECK FINDINGS Aortic arch: Bovine trunk. Transverse arch calcification. Imaged portion shows no evidence of aneurysm or dissection. No significant stenosis of the major arch vessel origins. Right carotid system: No evidence of dissection, stenosis (50% or greater) or occlusion. Left carotid system: No evidence of dissection, stenosis (50% or greater) or occlusion. Vertebral arteries: Codominant. No evidence of dissection, stenosis (50% or greater) or occlusion. Nonstenotic calcific plaque at both vertebral origins. Skeleton: Cervical spondylosis with reversal of normal cervical lordotic curve, and advanced disc space narrowing at C5-6 and C6-7. Severe facet arthropathy at C2-3, C3-4, C4-5, and C5-6 all on the LEFT could be contributory to neck pain. Other neck: No neck masses. Upper chest: No pneumothorax or lung nodule. Review of the MIP images confirms the above findings CTA HEAD FINDINGS Anterior circulation: No significant stenosis, proximal occlusion, aneurysm, or vascular malformation. Posterior circulation: No significant stenosis, proximal occlusion, aneurysm, or vascular malformation. Estimated 50% stenosis of the LEFT vertebral V4 segment, of doubtful significance given the robust RIGHT vertebral and normal basilar. Venous sinuses: As permitted by contrast timing, patent. Anatomic variants: None of significance. Delayed phase: No abnormal postcontrast enhancement of the brain or visible  meninges. Review of the MIP images confirms the above findings IMPRESSION: Remote RIGHT basal ganglia infarct, and LEFT periventricular deep white matter infarct. No acute intracranial findings. No abnormal postcontrast enhancement. No intracranial or extracranial stenosis, dissection, or occlusion. Advanced cervical spondylosis with severe LEFT-sided facet arthropathy C2 through C6; correlate clinically as a cause of cervicalgia. Aortic Atherosclerosis (ICD10-I70.0). Electronically Signed   By: Staci Righter M.D.   On: 08/23/2018 17:35   Dg Chest 2 View  Result Date: 08/23/2018 CLINICAL DATA:  Chest pain. EXAM: CHEST - 2 VIEW COMPARISON:  05/30/2000 report. FINDINGS: Mediastinum structures are normal. Calcified hilar lymph nodes consistent old granulomas disease noted. Cardiomegaly with normal pulmonary vascularity. Small calcified pulmonary nodules noted consistent granulomas. Mild left base subsegmental atelectasis/infiltrate and tiny left pleural effusion. No pneumothorax.  No acute bony abnormality. Pleural effusion or pneumothorax. No acute bony abnormality. IMPRESSION: 1.  Cardiomegaly.  No pulmonary venous congestion. 2. Mild left base subsegmental atelectasis/infiltrate and tiny left pleural effusion. Electronically Signed   By: Marcello Moores  Register   On: 08/23/2018 11:51   Ct Angio Neck W And/or Wo Contrast  Result Date: 08/23/2018 CLINICAL DATA:  LEFT-sided neck pain for 2 days. Concern for dissection. EXAM: CT ANGIOGRAPHY HEAD AND NECK TECHNIQUE: Multidetector CT imaging of the head and neck was performed using the standard protocol during bolus administration of intravenous contrast. Multiplanar CT image reconstructions and MIPs were obtained to evaluate the vascular anatomy. Carotid stenosis measurements (when applicable) are obtained utilizing NASCET criteria, using the distal internal carotid diameter as the denominator. CONTRAST:  35mL OMNIPAQUE IOHEXOL 350 MG/ML SOLN COMPARISON:  None. FINDINGS:  CT HEAD FINDINGS Brain: No evidence for acute infarction, hemorrhage, mass lesion, hydrocephalus, or extra-axial fluid. Mild cerebral and cerebellar atrophy. Old RIGHT MCA infarct affects the lentiform nucleus. Additional periventricular infarct is seen dorsal and anterior to the caudate nucleus on the LEFT. Vascular: Reported separately. Skull: Intact. Sinuses: No layering fluid. Orbits: Dense lenticular opacities.  No masses. Review of the MIP images confirms the above findings CTA NECK FINDINGS Aortic arch: Bovine trunk. Transverse arch calcification. Imaged portion shows no evidence of aneurysm or dissection. No significant stenosis of the major arch vessel origins. Right carotid system: No evidence of dissection, stenosis (50% or greater) or occlusion. Left carotid system: No evidence of dissection, stenosis (50% or greater) or occlusion. Vertebral arteries: Codominant. No evidence of dissection, stenosis (50% or greater) or occlusion. Nonstenotic calcific plaque at both vertebral origins. Skeleton: Cervical spondylosis with reversal of normal cervical lordotic curve, and advanced disc space narrowing at C5-6 and C6-7. Severe facet arthropathy at C2-3, C3-4, C4-5, and C5-6 all on the LEFT could be contributory to neck pain. Other neck: No neck masses. Upper chest: No pneumothorax or lung nodule. Review of the MIP images confirms the above findings CTA HEAD FINDINGS Anterior circulation: No significant stenosis, proximal occlusion, aneurysm, or vascular malformation. Posterior circulation: No significant stenosis, proximal occlusion, aneurysm, or vascular malformation. Estimated 50% stenosis of the LEFT vertebral V4 segment, of doubtful significance given the robust RIGHT vertebral and normal basilar. Venous sinuses: As permitted by contrast timing, patent. Anatomic variants: None of significance. Delayed phase: No abnormal postcontrast enhancement of the brain or visible meninges. Review of the MIP images  confirms the above findings IMPRESSION: Remote RIGHT basal ganglia infarct, and LEFT periventricular deep white matter infarct. No acute intracranial findings. No abnormal postcontrast enhancement. No intracranial or extracranial stenosis, dissection, or occlusion. Advanced cervical spondylosis with severe LEFT-sided facet arthropathy C2 through C6; correlate clinically as a cause of cervicalgia. Aortic Atherosclerosis (ICD10-I70.0). Electronically Signed   By: Staci Righter M.D.   On: 08/23/2018 17:35     ____________________________________________   PROCEDURES  Procedure(s) performed: None Procedures Critical Care performed:  None ____________________________________________   INITIAL IMPRESSION / ASSESSMENT AND PLAN / ED COURSE  71 y.o. female the history of chronic kidney disease, hypertension, hyperlipidemia, CVA who presents for evaluation of neck pain. patient with 2 days of sharp left-sided chest pain and vertigo. At this time clinical concern for cervical artery dissection. The patient a month ago had a fistula placed to initiate dialysis and her GFR is currently 9. I discussed with Dr. Mayer Camel from neuro radiology who recommended CTA of the neck as MRI has a 50% missing rate of dissection.  I then spoke with Dr. Juleen China who recommended giving a bolus of IV fluid and hydrating patient with normal saline for several hours after CT. I discussed with patient risk and benefits of contrast vs missed dissection of a cervical artery and patient is agreement to proceed with contrast. CT pending.     _________________________ 10:23 PM on 08/23/2018 -----------------------------------------  CT negative for dissection. Patient was kept in the emergency room for 5 hours post-CT receiving IV fluids per recommendation of nephrology. She will be discharged home with Valium for muscle relaxant and Tylenol for pain. Recommended close follow-up with her nephrologist on Monday to check her  kidney function. Recommended increase oral hydration. At this time patient's presentation is most likely due to torticollis versus muscle sprain. discussed return precautions for any signs of stroke.   As part of my medical decision making, I reviewed the following data within the [facility] notes reviewed and incorporated, Labs reviewed , EKG interpreted , Old EKG reviewed, Old chart reviewed, Radiograph reviewed , A consult was requested and obtained from this/these consultant(s) Radiology and Nephrology, Notes from prior ED visits and [place] Controlled Substance Database    Pertinent labs & imaging results that were available during my care of the patient were reviewed by me and considered in my medical decision making (see chart for details).    ____________________________________________   FINAL CLINICAL IMPRESSION(S) / ED DIAGNOSES  Final diagnoses:  Neck pain      NEW MEDICATIONS STARTED DURING THIS VISIT:  ED Discharge Orders         Ordered    diazepam (VALIUM) 2 MG tablet  Every 8 hours PRN     08/23/18 2000           Note:  This document was prepared using Dragon voice recognition software and may include unintentional dictation errors.    Alfred Levins, [place], MD 08/23/18 2225

## 2018-08-23 NOTE — Discharge Instructions (Signed)
Your CT was negative for any acute injury of the neck. Take valium as a muscle relaxant as prescribed, take tylenol 1000mg  every 8 hours for pain. Apply heat and perform range of motion exercises several times a day. Follow up with your doctor and your Nephrologist on Monday. Return to the ER for chest pain, worsening neck pain, facial droop, slurred speech, unilateral weakness or numbness or difficulty walking.

## 2018-08-23 NOTE — ED Triage Notes (Signed)
First Nurse Note:  C/O pain to left neck radiating up toward right head, chills, and dizziness since Wednesday.    Patient is AAOx3. NAD.  Ambulates with easy and steady gait.  NAD

## 2018-08-23 NOTE — Telephone Encounter (Signed)
I agree. Thank you.

## 2018-08-23 NOTE — ED Triage Notes (Signed)
Pt arrives with complaints of upper left sided chest pain that radiates to her neck and up to her head. Pt states the pain feels sharp and started Wednesday. Pt contacted her PCP who suggested pt come to ED to be evaluated.

## 2018-08-23 NOTE — ED Notes (Signed)
Pt left w/o signing DC instructions. Pt given verbal and written DC instructions.

## 2018-08-26 DIAGNOSIS — N185 Chronic kidney disease, stage 5: Secondary | ICD-10-CM | POA: Diagnosis not present

## 2018-08-26 DIAGNOSIS — N186 End stage renal disease: Secondary | ICD-10-CM | POA: Diagnosis not present

## 2018-08-26 DIAGNOSIS — I10 Essential (primary) hypertension: Secondary | ICD-10-CM | POA: Diagnosis not present

## 2018-08-26 DIAGNOSIS — D631 Anemia in chronic kidney disease: Secondary | ICD-10-CM | POA: Diagnosis not present

## 2018-08-26 DIAGNOSIS — E872 Acidosis: Secondary | ICD-10-CM | POA: Diagnosis not present

## 2018-08-26 DIAGNOSIS — E875 Hyperkalemia: Secondary | ICD-10-CM | POA: Diagnosis not present

## 2018-08-27 ENCOUNTER — Inpatient Hospital Stay: Payer: Medicare Other

## 2018-08-27 VITALS — BP 137/68 | HR 81 | Temp 98.5°F | Resp 18

## 2018-08-27 DIAGNOSIS — N184 Chronic kidney disease, stage 4 (severe): Secondary | ICD-10-CM | POA: Diagnosis not present

## 2018-08-27 DIAGNOSIS — D649 Anemia, unspecified: Secondary | ICD-10-CM

## 2018-08-27 DIAGNOSIS — D631 Anemia in chronic kidney disease: Secondary | ICD-10-CM | POA: Diagnosis not present

## 2018-08-27 DIAGNOSIS — D472 Monoclonal gammopathy: Secondary | ICD-10-CM | POA: Diagnosis not present

## 2018-08-27 DIAGNOSIS — I129 Hypertensive chronic kidney disease with stage 1 through stage 4 chronic kidney disease, or unspecified chronic kidney disease: Secondary | ICD-10-CM | POA: Diagnosis not present

## 2018-08-27 DIAGNOSIS — D509 Iron deficiency anemia, unspecified: Secondary | ICD-10-CM | POA: Diagnosis not present

## 2018-08-27 MED ORDER — SODIUM CHLORIDE 0.9 % IV SOLN: 200.0000 mg | Freq: Once | INTRAVENOUS | Status: DC

## 2018-08-27 MED ORDER — IRON SUCROSE 20 MG/ML IV SOLN
200.0000 mg | Freq: Once | INTRAVENOUS | Status: AC
  Administered 2018-08-27: 200 mg via INTRAVENOUS
  Filled 2018-08-27: qty 10

## 2018-08-27 MED ORDER — SODIUM CHLORIDE 0.9 % IV SOLN
INTRAVENOUS | Status: DC
  Administered 2018-08-27: 14:00:00 via INTRAVENOUS
  Filled 2018-08-27: qty 250

## 2018-08-30 ENCOUNTER — Encounter: Payer: Self-pay | Admitting: Adult Health

## 2018-08-30 ENCOUNTER — Ambulatory Visit: Payer: Medicare Other | Admitting: Nurse Practitioner

## 2018-08-30 VITALS — BP 162/80 | HR 81 | Resp 16 | Ht 66.0 in | Wt 163.0 lb

## 2018-08-30 DIAGNOSIS — I10 Essential (primary) hypertension: Secondary | ICD-10-CM

## 2018-08-30 DIAGNOSIS — N764 Abscess of vulva: Secondary | ICD-10-CM

## 2018-08-30 DIAGNOSIS — N184 Chronic kidney disease, stage 4 (severe): Secondary | ICD-10-CM | POA: Diagnosis not present

## 2018-08-30 NOTE — Progress Notes (Signed)
[facility] [place], [place] [address]  Internal MEDICINE  Office Visit Note  Patient Name: Kristen Francis  [id number]  [id number]  Date of Service: 09/10/2018   Pt is here for a sick visit.  Chief Complaint  Patient presents with  . Hospitalization Follow-up    neck and shoulder pain   . Cyst    bubble in the vagina      The patent is c/o cyst on the vagina. Near the center of the labia. First noted it about 2 weeks ago. Is about the size of a large marble. It is soft and she can move it under the surface of the skin. Has not changed in size over the 2 weeks it has been present .She states that it is not painful.  She also states that she went to ER last week due to neck and shoulder pain. She had a lot of testing done and was diagnosed with neck strain. Was given rx for diazepam. She took for a few days, but is no longer taking this. Symptoms have resolved.        Current Medication:  Outpatient Encounter Medications as of 08/30/2018  Medication Sig  . allopurinol (ZYLOPRIM) 100 MG tablet Take 1 tablet (100 mg total) by mouth daily.  Marland Kitchen amLODipine (NORVASC) 10 MG tablet TAKE 1 TABLET BY MOUTH ONCE DAILY  . carvedilol (COREG) 25 MG tablet Take 25 mg by mouth 2 (two) times daily with a meal.  . losartan (COZAAR) 50 MG tablet Take 50 mg by mouth daily.  Marland Kitchen torsemide (DEMADEX) 5 MG tablet Take 5 mg by mouth daily.   . cholecalciferol (VITAMIN D) 1000 units tablet Take 1,000 Units by mouth daily.   . diazepam (VALIUM) 2 MG tablet Take 1 tablet (2 mg total) by mouth every 8 (eight) hours as needed for muscle spasms. (Patient not taking: Reported on 08/30/2018)  . ferrous sulfate 325 (65 FE) MG tablet Take 325 mg by mouth daily.   Marland Kitchen oxyCODONE-acetaminophen (PERCOCET) 5-325 MG tablet Take 1-2 tablets by mouth every 6 (six) hours as needed for up to 14 days for severe pain.  . sevelamer carbonate (RENVELA) 800 MG tablet Take by mouth.   No  facility-administered encounter medications on file as of 08/30/2018.       Medical History: Past Medical History:  Diagnosis Date  . Anemia   . Arthritis   . Chronic kidney disease    ESRD  . Complication of anesthesia    had procedure and felt incision early 80s  . Gout   . Hyperlipemia   . Hypertension   . Macular degeneration, right eye   . Stroke ([place])      Today's Vitals   08/30/18 1501  BP: (!) 162/80  Pulse: 81  Resp: 16  SpO2: 94%  Weight: 163 lb (73.9 kg)  Height: 5\' 6"  (1.676 m)   Body mass index is 26.31 kg/m.  Review of Systems  Constitutional: Negative for activity change, chills, fatigue and unexpected weight change.  HENT: Negative for congestion, rhinorrhea, sneezing, sore throat and voice change.   Eyes: Negative.  Negative for photophobia, pain and redness.  Respiratory: Negative for cough, chest tightness and shortness of breath.   Cardiovascular: Negative for chest pain and palpitations.  Gastrointestinal: Negative for abdominal pain, constipation, diarrhea, nausea and vomiting.  Genitourinary: Positive for genital sores. Negative for dysuria and frequency.       Has a pea-sized, non-painful cyst in the  center/left of her labia. Not changing in size, just not going away. Has been present for about 2 weeks.   Musculoskeletal: Negative for arthralgias, back pain, joint swelling and neck pain.  Skin: Negative for rash.  Allergic/Immunologic: Negative.  Negative for environmental allergies.  Neurological: Negative for tremors, speech difficulty, weakness and numbness.  Hematological: Negative for adenopathy. Does not bruise/bleed easily.  Psychiatric/Behavioral: Negative for behavioral problems and sleep disturbance. The patient is not nervous/anxious.     Physical Exam  Constitutional: She is oriented to person, place, and time. She appears well-developed and well-nourished. No distress.  HENT:  Head: Normocephalic and atraumatic.  Nose: Nose  normal.  Mouth/Throat: Oropharynx is clear and moist. No oropharyngeal exudate.  Eyes: Pupils are equal, round, and reactive to light. Conjunctivae and EOM are normal.  Neck: Normal range of motion. Neck supple. No JVD present. No tracheal deviation present. No thyromegaly present.  Cardiovascular: Normal rate, regular rhythm and normal heart sounds. Exam reveals no gallop and no friction rub.  No murmur heard. Pulmonary/Chest: Effort normal and breath sounds normal. No respiratory distress. She has no wheezes. She has no rales. She exhibits no tenderness.  Abdominal: Soft. Bowel sounds are normal. There is no tenderness. There is no guarding.  Genitourinary:  Genitourinary Comments: Very small, palpable cyst on the left labia. measures about 45mm in diameter. Non tender and there is no redness or swelling present. No drainage is noted at this time.   Musculoskeletal: Normal range of motion.  Lymphadenopathy:    She has no cervical adenopathy.  Neurological: She is alert and oriented to person, place, and time. No cranial nerve deficit.  Skin: Skin is warm and dry. Capillary refill takes less than 2 seconds. She is not diaphoretic.  Psychiatric: She has a normal mood and affect. Her behavior is normal. Judgment and thought content normal.  Nursing note and vitals reviewed.  Assessment/Plan: 1. Furuncle of vulva Appears to be resolving on it's own. Will monitor and treat with antibiotics as indicated.   2. Hypertension, unspecified type Generally stable. Continue bp medication as prescribed .  3. Chronic kidney disease (CKD), stage IV (severe) (HCC) Regular visits with nephrology as scheduled.   General Counseling: Seriah verbalizes understanding of the findings of todays visit and agrees with plan of treatment. I have discussed any further diagnostic evaluation that may be needed or ordered today. We also reviewed her medications today. she has been encouraged to call the office with any  questions or concerns that should arise related to todays visit.    Counseling:  This patient was seen by Leretha Pol FNP Collaboration with Dr Lavera Guise as a part of collaborative care agreement  Time spent: 15 Minutes

## 2018-09-04 DIAGNOSIS — E872 Acidosis: Secondary | ICD-10-CM | POA: Diagnosis not present

## 2018-09-04 DIAGNOSIS — N186 End stage renal disease: Secondary | ICD-10-CM | POA: Diagnosis not present

## 2018-09-04 DIAGNOSIS — N185 Chronic kidney disease, stage 5: Secondary | ICD-10-CM | POA: Diagnosis not present

## 2018-09-04 DIAGNOSIS — D631 Anemia in chronic kidney disease: Secondary | ICD-10-CM | POA: Diagnosis not present

## 2018-09-04 DIAGNOSIS — E875 Hyperkalemia: Secondary | ICD-10-CM | POA: Diagnosis not present

## 2018-09-04 DIAGNOSIS — J811 Chronic pulmonary edema: Secondary | ICD-10-CM | POA: Diagnosis not present

## 2018-09-04 DIAGNOSIS — I120 Hypertensive chronic kidney disease with stage 5 chronic kidney disease or end stage renal disease: Secondary | ICD-10-CM | POA: Diagnosis not present

## 2018-09-04 DIAGNOSIS — Z789 Other specified health status: Secondary | ICD-10-CM | POA: Diagnosis not present

## 2018-09-04 DIAGNOSIS — R079 Chest pain, unspecified: Secondary | ICD-10-CM | POA: Diagnosis not present

## 2018-09-09 ENCOUNTER — Ambulatory Visit (INDEPENDENT_AMBULATORY_CARE_PROVIDER_SITE_OTHER): Payer: Medicare Other | Admitting: Vascular Surgery

## 2018-09-09 ENCOUNTER — Encounter (INDEPENDENT_AMBULATORY_CARE_PROVIDER_SITE_OTHER): Payer: Medicare Other

## 2018-09-09 ENCOUNTER — Encounter

## 2018-09-10 DIAGNOSIS — N764 Abscess of vulva: Secondary | ICD-10-CM | POA: Insufficient documentation

## 2018-09-17 DIAGNOSIS — N185 Chronic kidney disease, stage 5: Secondary | ICD-10-CM | POA: Diagnosis not present

## 2018-09-17 DIAGNOSIS — D631 Anemia in chronic kidney disease: Secondary | ICD-10-CM | POA: Diagnosis not present

## 2018-09-17 DIAGNOSIS — I120 Hypertensive chronic kidney disease with stage 5 chronic kidney disease or end stage renal disease: Secondary | ICD-10-CM | POA: Diagnosis not present

## 2018-09-17 DIAGNOSIS — E875 Hyperkalemia: Secondary | ICD-10-CM | POA: Diagnosis not present

## 2018-09-17 DIAGNOSIS — E872 Acidosis: Secondary | ICD-10-CM | POA: Diagnosis not present

## 2018-09-21 DIAGNOSIS — I739 Peripheral vascular disease, unspecified: Secondary | ICD-10-CM | POA: Insufficient documentation

## 2018-09-21 DIAGNOSIS — D631 Anemia in chronic kidney disease: Secondary | ICD-10-CM | POA: Insufficient documentation

## 2018-09-21 DIAGNOSIS — D509 Iron deficiency anemia, unspecified: Secondary | ICD-10-CM | POA: Insufficient documentation

## 2018-09-21 DIAGNOSIS — R197 Diarrhea, unspecified: Secondary | ICD-10-CM | POA: Insufficient documentation

## 2018-09-21 DIAGNOSIS — I129 Hypertensive chronic kidney disease with stage 1 through stage 4 chronic kidney disease, or unspecified chronic kidney disease: Secondary | ICD-10-CM | POA: Insufficient documentation

## 2018-09-21 DIAGNOSIS — E1129 Type 2 diabetes mellitus with other diabetic kidney complication: Secondary | ICD-10-CM | POA: Insufficient documentation

## 2018-09-21 DIAGNOSIS — R509 Fever, unspecified: Secondary | ICD-10-CM | POA: Insufficient documentation

## 2018-09-21 DIAGNOSIS — R519 Headache, unspecified: Secondary | ICD-10-CM | POA: Insufficient documentation

## 2018-09-21 DIAGNOSIS — E440 Moderate protein-calorie malnutrition: Secondary | ICD-10-CM | POA: Insufficient documentation

## 2018-09-21 DIAGNOSIS — L239 Allergic contact dermatitis, unspecified cause: Secondary | ICD-10-CM | POA: Insufficient documentation

## 2018-09-21 DIAGNOSIS — R0602 Shortness of breath: Secondary | ICD-10-CM | POA: Insufficient documentation

## 2018-09-21 DIAGNOSIS — R52 Pain, unspecified: Secondary | ICD-10-CM | POA: Insufficient documentation

## 2018-09-21 DIAGNOSIS — D689 Coagulation defect, unspecified: Secondary | ICD-10-CM | POA: Insufficient documentation

## 2018-09-21 DIAGNOSIS — Z23 Encounter for immunization: Secondary | ICD-10-CM | POA: Insufficient documentation

## 2018-09-21 DIAGNOSIS — N2581 Secondary hyperparathyroidism of renal origin: Secondary | ICD-10-CM | POA: Insufficient documentation

## 2018-09-21 DIAGNOSIS — E8779 Other fluid overload: Secondary | ICD-10-CM | POA: Insufficient documentation

## 2018-09-23 ENCOUNTER — Ambulatory Visit: Payer: Self-pay | Admitting: Nurse Practitioner

## 2018-09-23 DIAGNOSIS — D509 Iron deficiency anemia, unspecified: Secondary | ICD-10-CM | POA: Diagnosis not present

## 2018-09-23 DIAGNOSIS — E1129 Type 2 diabetes mellitus with other diabetic kidney complication: Secondary | ICD-10-CM | POA: Diagnosis not present

## 2018-09-23 DIAGNOSIS — N2581 Secondary hyperparathyroidism of renal origin: Secondary | ICD-10-CM | POA: Diagnosis not present

## 2018-09-23 DIAGNOSIS — D631 Anemia in chronic kidney disease: Secondary | ICD-10-CM | POA: Diagnosis not present

## 2018-09-23 DIAGNOSIS — N186 End stage renal disease: Secondary | ICD-10-CM | POA: Diagnosis not present

## 2018-09-25 DIAGNOSIS — D631 Anemia in chronic kidney disease: Secondary | ICD-10-CM | POA: Diagnosis not present

## 2018-09-25 DIAGNOSIS — N2581 Secondary hyperparathyroidism of renal origin: Secondary | ICD-10-CM | POA: Diagnosis not present

## 2018-09-25 DIAGNOSIS — N186 End stage renal disease: Secondary | ICD-10-CM | POA: Diagnosis not present

## 2018-09-25 DIAGNOSIS — D509 Iron deficiency anemia, unspecified: Secondary | ICD-10-CM | POA: Diagnosis not present

## 2018-09-25 DIAGNOSIS — E1129 Type 2 diabetes mellitus with other diabetic kidney complication: Secondary | ICD-10-CM | POA: Diagnosis not present

## 2018-09-27 DIAGNOSIS — N186 End stage renal disease: Secondary | ICD-10-CM | POA: Diagnosis not present

## 2018-09-27 DIAGNOSIS — D631 Anemia in chronic kidney disease: Secondary | ICD-10-CM | POA: Diagnosis not present

## 2018-09-27 DIAGNOSIS — N2581 Secondary hyperparathyroidism of renal origin: Secondary | ICD-10-CM | POA: Diagnosis not present

## 2018-09-27 DIAGNOSIS — E1129 Type 2 diabetes mellitus with other diabetic kidney complication: Secondary | ICD-10-CM | POA: Diagnosis not present

## 2018-09-27 DIAGNOSIS — D509 Iron deficiency anemia, unspecified: Secondary | ICD-10-CM | POA: Diagnosis not present

## 2018-09-30 DIAGNOSIS — E1129 Type 2 diabetes mellitus with other diabetic kidney complication: Secondary | ICD-10-CM | POA: Diagnosis not present

## 2018-09-30 DIAGNOSIS — D509 Iron deficiency anemia, unspecified: Secondary | ICD-10-CM | POA: Diagnosis not present

## 2018-09-30 DIAGNOSIS — D631 Anemia in chronic kidney disease: Secondary | ICD-10-CM | POA: Diagnosis not present

## 2018-09-30 DIAGNOSIS — N186 End stage renal disease: Secondary | ICD-10-CM | POA: Diagnosis not present

## 2018-09-30 DIAGNOSIS — N2581 Secondary hyperparathyroidism of renal origin: Secondary | ICD-10-CM | POA: Diagnosis not present

## 2018-10-02 DIAGNOSIS — E1129 Type 2 diabetes mellitus with other diabetic kidney complication: Secondary | ICD-10-CM | POA: Diagnosis not present

## 2018-10-02 DIAGNOSIS — D631 Anemia in chronic kidney disease: Secondary | ICD-10-CM | POA: Diagnosis not present

## 2018-10-02 DIAGNOSIS — N2581 Secondary hyperparathyroidism of renal origin: Secondary | ICD-10-CM | POA: Diagnosis not present

## 2018-10-02 DIAGNOSIS — N186 End stage renal disease: Secondary | ICD-10-CM | POA: Diagnosis not present

## 2018-10-02 DIAGNOSIS — D509 Iron deficiency anemia, unspecified: Secondary | ICD-10-CM | POA: Diagnosis not present

## 2018-10-03 DIAGNOSIS — Z992 Dependence on renal dialysis: Secondary | ICD-10-CM | POA: Diagnosis not present

## 2018-10-03 DIAGNOSIS — N186 End stage renal disease: Secondary | ICD-10-CM | POA: Diagnosis not present

## 2018-10-03 DIAGNOSIS — N040 Nephrotic syndrome with minor glomerular abnormality: Secondary | ICD-10-CM | POA: Diagnosis not present

## 2018-10-04 DIAGNOSIS — E1129 Type 2 diabetes mellitus with other diabetic kidney complication: Secondary | ICD-10-CM | POA: Diagnosis not present

## 2018-10-04 DIAGNOSIS — D631 Anemia in chronic kidney disease: Secondary | ICD-10-CM | POA: Diagnosis not present

## 2018-10-04 DIAGNOSIS — N2581 Secondary hyperparathyroidism of renal origin: Secondary | ICD-10-CM | POA: Diagnosis not present

## 2018-10-04 DIAGNOSIS — N186 End stage renal disease: Secondary | ICD-10-CM | POA: Diagnosis not present

## 2018-10-04 DIAGNOSIS — D509 Iron deficiency anemia, unspecified: Secondary | ICD-10-CM | POA: Diagnosis not present

## 2018-10-07 DIAGNOSIS — N186 End stage renal disease: Secondary | ICD-10-CM | POA: Diagnosis not present

## 2018-10-07 DIAGNOSIS — N2581 Secondary hyperparathyroidism of renal origin: Secondary | ICD-10-CM | POA: Diagnosis not present

## 2018-10-07 DIAGNOSIS — D631 Anemia in chronic kidney disease: Secondary | ICD-10-CM | POA: Diagnosis not present

## 2018-10-07 DIAGNOSIS — E1129 Type 2 diabetes mellitus with other diabetic kidney complication: Secondary | ICD-10-CM | POA: Diagnosis not present

## 2018-10-07 DIAGNOSIS — D509 Iron deficiency anemia, unspecified: Secondary | ICD-10-CM | POA: Diagnosis not present

## 2018-10-09 ENCOUNTER — Telehealth (INDEPENDENT_AMBULATORY_CARE_PROVIDER_SITE_OTHER): Payer: Self-pay

## 2018-10-09 DIAGNOSIS — D509 Iron deficiency anemia, unspecified: Secondary | ICD-10-CM | POA: Diagnosis not present

## 2018-10-09 DIAGNOSIS — D631 Anemia in chronic kidney disease: Secondary | ICD-10-CM | POA: Diagnosis not present

## 2018-10-09 DIAGNOSIS — N2581 Secondary hyperparathyroidism of renal origin: Secondary | ICD-10-CM | POA: Diagnosis not present

## 2018-10-09 DIAGNOSIS — E1129 Type 2 diabetes mellitus with other diabetic kidney complication: Secondary | ICD-10-CM | POA: Diagnosis not present

## 2018-10-09 DIAGNOSIS — N186 End stage renal disease: Secondary | ICD-10-CM | POA: Diagnosis not present

## 2018-10-09 NOTE — Telephone Encounter (Signed)
Patient stated that the appointment was moved and that she can now make the new appointment that she was given.

## 2018-10-10 ENCOUNTER — Inpatient Hospital Stay: Payer: Medicare Other

## 2018-10-10 ENCOUNTER — Inpatient Hospital Stay: Payer: Medicare Other | Admitting: Hematology and Oncology

## 2018-10-10 NOTE — Progress Notes (Deleted)
[facility] day:  10/10/2018  Chief Complaint: Kristen Francis is a 72 y.o. female with anemia of chronic renal disease and a monoclonal gammopathy of unknown significance (MGUS) who is seen for 6 month assessment.  HPI:   The patient was last seen in the medical oncology clinic on 04/08/2018.  At that time, she was doing well. She had chronic arthritic pain in her shoulders and knees. Her legs feel "tired". She denied bleeding.  Exam was stable.  Hemoglobin was 9.8 with a hematocrit of 32.4 on 04/01/2018.   Hemoglonin was 7.9.  Ferritin was 30.  She received weekly Venofer x 2 (08/20/2018 - 08/27/2018).  Labs on 08/23/2018 revealed a hematocrit of 25.4, hemoglobin 8.2, and MCV 84.  Creatinine was 5.25.  During the interim,   Past Medical History:  Diagnosis Date  . Anemia   . Arthritis   . Chronic kidney disease    ESRD  . Complication of anesthesia    had procedure and felt incision early 80s  . Gout   . Hyperlipemia   . Hypertension   . Macular degeneration, right eye   . Stroke [facility])     Past Surgical History:  Procedure Laterality Date  . ABDOMINAL HYSTERECTOMY    . APPENDECTOMY    . AV FISTULA PLACEMENT Left 07/12/2018   Procedure: INSERTION OF ARTERIOVENOUS (AV) GORE-TEX GRAFT ARM ( BRACHIAL AXILLARY );  Surgeon: Katha Cabal, MD;  Location: [facility] ORS;  Service: Vascular;  Laterality: Left;  . COLONOSCOPY WITH PROPOFOL N/A 01/14/2016   Procedure: COLONOSCOPY WITH PROPOFOL;  Surgeon: Manya Silvas, MD;  Location: [facility] ENDOSCOPY;  Service: Endoscopy;  Laterality: N/A;  . TONSILLECTOMY      Family History  Problem Relation Age of Onset  . Cancer Mother     Social History:  reports that she has quit smoking. She has never used smokeless tobacco. She reports that she does not drink alcohol or use drugs.  Patient is a former smoker (back in the 55s). 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  She does not accept blood products due to being a Jehovah's Witness.  Her husband's name is Juanda Crumble.  The patient is alone today.  Allergies:  Allergies  Allergen Reactions  . Other Other (See Comments)    Pt does not receive Blood   . Penicillins Hives    Has patient had a PCN reaction causing immediate rash, facial/tongue/throat swelling, SOB or lightheadedness with hypotension: YES Has patient had a PCN reaction causing severe rash involving mucus membranes or skin necrosis: no Has patient had a PCN reaction that required hospitalization: no Has patient had a PCN reaction occurring within the last 10 years: no If all of the above answers are "NO", then may proceed with Cephalosporin use.   . Sodium Bicarbonate Itching    Current Medications: Current Outpatient Medications  Medication Sig Dispense Refill  . allopurinol (ZYLOPRIM) 100 MG tablet Take 1 tablet (100 mg total) by mouth daily. 30 tablet 1  . amLODipine (NORVASC) 10 MG tablet TAKE 1 TABLET BY MOUTH ONCE DAILY 30 tablet 8  . carvedilol (COREG) 25 MG tablet Take 25 mg by mouth 2 (two) times daily with a meal.    . cholecalciferol (VITAMIN D) 1000 units tablet Take 1,000 Units by mouth daily.     . diazepam (VALIUM) 2 MG tablet Take 1 tablet (2 mg total) by mouth every 8 (eight) hours as needed  for muscle spasms. (Patient not taking: Reported on 08/30/2018) 10 tablet 0  . ferrous sulfate 325 (65 FE) MG tablet Take 325 mg by mouth daily.     Marland Kitchen losartan (COZAAR) 50 MG tablet Take 50 mg by mouth daily.    Marland Kitchen oxyCODONE-acetaminophen (PERCOCET) 5-325 MG tablet Take 1-2 tablets by mouth every 6 (six) hours as needed for up to 14 days for severe pain. 40 tablet 0  . sevelamer carbonate (RENVELA) 800 MG tablet Take by mouth.    . torsemide (DEMADEX) 5 MG tablet Take 5 mg by mouth daily.   11   No current facility-administered medications for this visit.     Review of Systems  Constitutional: Positive for weight loss (down 1 pound).  Negative for diaphoresis, fever and malaise/fatigue.  HENT: Negative.   Eyes: Negative.   Respiratory: Negative for cough, hemoptysis, sputum production and shortness of breath.   Cardiovascular: Negative for chest pain, palpitations, orthopnea, leg swelling and PND.  Gastrointestinal: Negative for abdominal pain, blood in stool, constipation, diarrhea, melena, nausea and vomiting.  Genitourinary: Positive for frequency (nocturia). Negative for dysuria, hematuria and urgency.       CKD-4  Musculoskeletal: Positive for joint pain (RIGHT shoulder and BILATERAL knees). Negative for back pain, falls and myalgias.       H/o gout. BLE feel "tired".   Skin: Negative for itching and rash.  Neurological: Negative for dizziness, tremors, weakness and headaches.  Endo/Heme/Allergies: Does not bruise/bleed easily.  Psychiatric/Behavioral: Negative for depression, memory loss and suicidal ideas. The patient is not nervous/anxious and does not have insomnia.   All other systems reviewed and are negative.  Physical Exam: There were no vitals taken for this visit. GENERAL:  Well developed, well nourished, woman sitting comfortably in the exam room in no acute distress. MENTAL STATUS:  Alert and oriented to person, place and time. HEAD:  Shoulder length brown hair.  Normocephalic, atraumatic, face symmetric, no Cushingoid features. EYES:  Glasses.  Brown eyes.  Pupils equal round and reactive to light and accomodation.  No conjunctivitis or scleral icterus. ENT:  Oropharynx clear without lesion.  Tongue normal. Mucous membranes moist.  RESPIRATORY:  Clear to auscultation without rales, wheezes or rhonchi. CARDIOVASCULAR:  Regular rate and rhythm without murmur, rub or gallop. ABDOMEN:  Soft, non-tender, with active bowel sounds, and no hepatosplenomegaly.  No masses. SKIN:  No rashes, ulcers or lesions. EXTREMITIES: No edema, no skin discoloration or tenderness.  No palpable cords. LYMPH NODES: No  palpable cervical, supraclavicular, axillary or inguinal adenopathy  NEUROLOGICAL: Unremarkable. PSYCH:  Appropriate.    No visits with results within 3 Day(s) from this visit.  Latest known visit with results is:  Admission on 08/23/2018, Discharged on 08/23/2018  Component Date Value Ref Range Status  . Sodium 08/23/2018 141  135 - 145 mmol/L Final  . Potassium 08/23/2018 4.0  3.5 - 5.1 mmol/L Final  . Chloride 08/23/2018 109  98 - 111 mmol/L Final  . CO2 08/23/2018 22  22 - 32 mmol/L Final  . Glucose, Bld 08/23/2018 128* 70 - 99 mg/dL Final  . BUN 08/23/2018 55* 8 - 23 mg/dL Final  . Creatinine, Ser 08/23/2018 5.25* 0.44 - 1.00 mg/dL Final  . Calcium 08/23/2018 9.2  8.9 - 10.3 mg/dL Final  . GFR calc non Af Amer 08/23/2018 7* >60 mL/min Final  . GFR calc Af Amer 08/23/2018 9* >60 mL/min Final   Comment: (NOTE) The eGFR has been calculated using the CKD EPI equation.  This calculation has not been validated in all clinical situations. eGFR's persistently <60 mL/min signify possible Chronic Kidney Disease.   Georgiann Hahn gap 08/23/2018 10  5 - 15 Final   Performed at [facility], [address] [place], [place] [address]  . WBC 08/23/2018 9.6  3.6 - 11.0 K/uL Final  . RBC 08/23/2018 3.02* 3.80 - 5.20 MIL/uL Final  . Hemoglobin 08/23/2018 8.2* 12.0 - 16.0 g/dL Final  . HCT 08/23/2018 25.4* 35.0 - 47.0 % Final  . MCV 08/23/2018 84.0  80.0 - 100.0 fL Final  . MCH 08/23/2018 27.2  26.0 - 34.0 pg Final  . MCHC 08/23/2018 32.4  32.0 - 36.0 g/dL Final  . RDW 08/23/2018 18.4* 11.5 - 14.5 % Final  . Platelets 08/23/2018 298  150 - 440 K/uL Final   Performed at [facility], [address] [place], [place] [address]  . Troponin I 08/23/2018 <0.03  <0.03 ng/mL Final   Performed at [facility], [address] [place], [place] [address]  . Troponin I 08/23/2018 <0.03  <0.03 ng/mL Final   Performed at [facility], [address] [place], [place]  [address]    Assessment:  Kristen Francis is a 72 y.o. female with anemia of chronic renal disease and a monoclonal gammopathy of unknown significance (MGUS).  The patient has chronic kidney disease.  Her creatinine was 2.46 (CrCl 22 ml/min) on 12/12/2016, 2.7 (CrCl 20 ml/min) on 01/2017, and 3.21 (CrCl 14-17 ml/min) on 09/06/2017. Her hemoglobin was 8.2. She was started on oral iron.   Labs on 12/22/2016 revealed a negative SPEP.  B12 and folate were normal.  Light chains were 1.85.  UPEP was negative.  Labs on 09/06/2017 revealed an SPEP with an irregularity in the gamma region, which may represent a monoclonal protein.  Immunofixation revealed an IgG kappa monoclonal protein.  Work-up on 09/17/2017:  Hematocrit was 25.5, hemoglobin 8.4, MCV 81.4, platelets 379,000, WBC 5100 with an ANC of 3400.  Ferritin was 25 with a saturation of 11% with a TIBC of 317.  Normal labs included:  B12, folate was 18.4, epo level (12.9),  And TSH.   Sed rate was 83.  SPEP revealed a 0.2 gm/dL IgG monoclonal protein with kappa light chain specificity.  IgG was 910 and IgA 138.  Kappa free light chains were 102.3, lambda free light chains 49.5 with a ratio of 2.07 (0.26 - 1.65).  Ferritin has been followed: 25 on 09/17/2017, 191 on 11/02/2017, 139 on 11/30/2017, 98 on 12/31/2017, 78 on 02/11/2018, and 60.7 on 04/01/2018.  She received weekly Venofer x 3 (09/28/2017 - 10/12/2017) and x 2 (08/20/2018 - 08/27/2018).  Began Procrit injections, every 2 weeks, on 02/11/2018 (last 03/11/2018).   Bone marrow aspiration and biopsy on 11/09/2017 revealed a normocellular marrow with mild plasmacytosis (5% CD138, 4% by aspirate).  There was mild erythroid hyperplasia.  Iron stores were present.  Flow cytometry revealed no monoclonal B cell or aberrant T cell population. Cytogenetics were normal (15, XX).   Last colonoscopy in 2016 showed "a few polyps".  Oral iron causes constipation.  Diet is "ok". She eats meat about 5 days a  week. She denies ice pica. She denies bleeding (hematochezia, melena, or vaginal bleeding).   Renal biopsy on 01/25/2018 demonstrated focal segmental glomerulosclerosis (FSGS).  Symptomatically,  patient is doing well. She has chronic arthritic pain in her shoulders and knees. Her legs feel "tired". She denies bleeding. Exam is stable.  Hemoglobin  was 9.8 with a hematocrit of 32.4 on 04/01/2018.   Plan: 1.  Labs today:  CBC with diff, BMP, SPEP, ferritin. 2.  Anemia of chronic kidney disease:  She receives Aranesp at [facility] under Dr. Smith Mince 3.  Iron deficiency anemia:  4.  Monoclonal gammopathy of unknown significance (MGUS):   2.  Discuss FSGS and resulting CKD-4. She has met with [facility]. Dialysis recommended. Continue to follow up with nephrology as already scheduled.  3.  Discuss anemia. Patient's hemoglobin was 9.8 with a ferritin of 60.7 at [facility].  Discussed with Dr. Smith Mince plan for patient to receive IV iron at [facility] and Aranesp at [facility].  4.  Venofer 200 mg IV today.  5.  RTC in 6 months for MD assessment and labs (CBC with diff, SPEP, BMP, ferritin).     Lequita Asal, MD 10/10/2018, 5:09 AM   I saw and evaluated the patient, participating in the key portions of the service and reviewing pertinent diagnostic studies and records.  I reviewed the nurse practitioner's note and agree with the findings and the plan.  The assessment and plan were discussed with the patient. A few questions were asked by the patient and answered.   Nolon Stalls, MD 10/10/2018,5:09 AM

## 2018-10-11 DIAGNOSIS — E1129 Type 2 diabetes mellitus with other diabetic kidney complication: Secondary | ICD-10-CM | POA: Diagnosis not present

## 2018-10-11 DIAGNOSIS — D631 Anemia in chronic kidney disease: Secondary | ICD-10-CM | POA: Diagnosis not present

## 2018-10-11 DIAGNOSIS — N186 End stage renal disease: Secondary | ICD-10-CM | POA: Diagnosis not present

## 2018-10-11 DIAGNOSIS — D509 Iron deficiency anemia, unspecified: Secondary | ICD-10-CM | POA: Diagnosis not present

## 2018-10-11 DIAGNOSIS — N2581 Secondary hyperparathyroidism of renal origin: Secondary | ICD-10-CM | POA: Diagnosis not present

## 2018-10-14 DIAGNOSIS — D509 Iron deficiency anemia, unspecified: Secondary | ICD-10-CM | POA: Diagnosis not present

## 2018-10-14 DIAGNOSIS — N2581 Secondary hyperparathyroidism of renal origin: Secondary | ICD-10-CM | POA: Diagnosis not present

## 2018-10-14 DIAGNOSIS — D631 Anemia in chronic kidney disease: Secondary | ICD-10-CM | POA: Diagnosis not present

## 2018-10-14 DIAGNOSIS — N186 End stage renal disease: Secondary | ICD-10-CM | POA: Diagnosis not present

## 2018-10-14 DIAGNOSIS — E1129 Type 2 diabetes mellitus with other diabetic kidney complication: Secondary | ICD-10-CM | POA: Diagnosis not present

## 2018-10-16 DIAGNOSIS — E1129 Type 2 diabetes mellitus with other diabetic kidney complication: Secondary | ICD-10-CM | POA: Diagnosis not present

## 2018-10-16 DIAGNOSIS — N186 End stage renal disease: Secondary | ICD-10-CM | POA: Diagnosis not present

## 2018-10-16 DIAGNOSIS — D509 Iron deficiency anemia, unspecified: Secondary | ICD-10-CM | POA: Diagnosis not present

## 2018-10-16 DIAGNOSIS — D631 Anemia in chronic kidney disease: Secondary | ICD-10-CM | POA: Diagnosis not present

## 2018-10-16 DIAGNOSIS — N2581 Secondary hyperparathyroidism of renal origin: Secondary | ICD-10-CM | POA: Diagnosis not present

## 2018-10-18 DIAGNOSIS — N2581 Secondary hyperparathyroidism of renal origin: Secondary | ICD-10-CM | POA: Diagnosis not present

## 2018-10-18 DIAGNOSIS — D631 Anemia in chronic kidney disease: Secondary | ICD-10-CM | POA: Diagnosis not present

## 2018-10-18 DIAGNOSIS — N186 End stage renal disease: Secondary | ICD-10-CM | POA: Diagnosis not present

## 2018-10-18 DIAGNOSIS — D509 Iron deficiency anemia, unspecified: Secondary | ICD-10-CM | POA: Diagnosis not present

## 2018-10-18 DIAGNOSIS — E1129 Type 2 diabetes mellitus with other diabetic kidney complication: Secondary | ICD-10-CM | POA: Diagnosis not present

## 2018-10-21 DIAGNOSIS — N186 End stage renal disease: Secondary | ICD-10-CM | POA: Diagnosis not present

## 2018-10-21 DIAGNOSIS — E1129 Type 2 diabetes mellitus with other diabetic kidney complication: Secondary | ICD-10-CM | POA: Diagnosis not present

## 2018-10-21 DIAGNOSIS — D631 Anemia in chronic kidney disease: Secondary | ICD-10-CM | POA: Diagnosis not present

## 2018-10-21 DIAGNOSIS — N2581 Secondary hyperparathyroidism of renal origin: Secondary | ICD-10-CM | POA: Diagnosis not present

## 2018-10-21 DIAGNOSIS — D509 Iron deficiency anemia, unspecified: Secondary | ICD-10-CM | POA: Diagnosis not present

## 2018-10-23 DIAGNOSIS — D509 Iron deficiency anemia, unspecified: Secondary | ICD-10-CM | POA: Diagnosis not present

## 2018-10-23 DIAGNOSIS — E1129 Type 2 diabetes mellitus with other diabetic kidney complication: Secondary | ICD-10-CM | POA: Diagnosis not present

## 2018-10-23 DIAGNOSIS — N186 End stage renal disease: Secondary | ICD-10-CM | POA: Diagnosis not present

## 2018-10-23 DIAGNOSIS — D631 Anemia in chronic kidney disease: Secondary | ICD-10-CM | POA: Diagnosis not present

## 2018-10-23 DIAGNOSIS — N2581 Secondary hyperparathyroidism of renal origin: Secondary | ICD-10-CM | POA: Diagnosis not present

## 2018-10-25 ENCOUNTER — Ambulatory Visit: Payer: Medicare Other | Admitting: Hematology and Oncology

## 2018-10-25 ENCOUNTER — Other Ambulatory Visit: Payer: Medicare Other

## 2018-10-25 ENCOUNTER — Other Ambulatory Visit: Payer: Self-pay

## 2018-10-25 DIAGNOSIS — D509 Iron deficiency anemia, unspecified: Secondary | ICD-10-CM | POA: Diagnosis not present

## 2018-10-25 DIAGNOSIS — N2581 Secondary hyperparathyroidism of renal origin: Secondary | ICD-10-CM | POA: Diagnosis not present

## 2018-10-25 DIAGNOSIS — D631 Anemia in chronic kidney disease: Secondary | ICD-10-CM | POA: Diagnosis not present

## 2018-10-25 DIAGNOSIS — N186 End stage renal disease: Secondary | ICD-10-CM | POA: Diagnosis not present

## 2018-10-25 DIAGNOSIS — E1129 Type 2 diabetes mellitus with other diabetic kidney complication: Secondary | ICD-10-CM | POA: Diagnosis not present

## 2018-10-25 MED ORDER — ALLOPURINOL 100 MG PO TABS: 100.0000 mg | ORAL_TABLET | Freq: Every day | ORAL | 3 refills | Status: DC

## 2018-10-27 DIAGNOSIS — E1129 Type 2 diabetes mellitus with other diabetic kidney complication: Secondary | ICD-10-CM | POA: Diagnosis not present

## 2018-10-27 DIAGNOSIS — N2581 Secondary hyperparathyroidism of renal origin: Secondary | ICD-10-CM | POA: Diagnosis not present

## 2018-10-27 DIAGNOSIS — N186 End stage renal disease: Secondary | ICD-10-CM | POA: Diagnosis not present

## 2018-10-27 DIAGNOSIS — D631 Anemia in chronic kidney disease: Secondary | ICD-10-CM | POA: Diagnosis not present

## 2018-10-27 DIAGNOSIS — D509 Iron deficiency anemia, unspecified: Secondary | ICD-10-CM | POA: Diagnosis not present

## 2018-10-28 ENCOUNTER — Other Ambulatory Visit (INDEPENDENT_AMBULATORY_CARE_PROVIDER_SITE_OTHER): Payer: Self-pay | Admitting: Nurse Practitioner

## 2018-10-28 DIAGNOSIS — N186 End stage renal disease: Secondary | ICD-10-CM

## 2018-10-28 DIAGNOSIS — D631 Anemia in chronic kidney disease: Secondary | ICD-10-CM | POA: Diagnosis not present

## 2018-10-28 DIAGNOSIS — E1129 Type 2 diabetes mellitus with other diabetic kidney complication: Secondary | ICD-10-CM | POA: Diagnosis not present

## 2018-10-28 DIAGNOSIS — N2581 Secondary hyperparathyroidism of renal origin: Secondary | ICD-10-CM | POA: Diagnosis not present

## 2018-10-28 DIAGNOSIS — D509 Iron deficiency anemia, unspecified: Secondary | ICD-10-CM | POA: Diagnosis not present

## 2018-10-29 ENCOUNTER — Ambulatory Visit (INDEPENDENT_AMBULATORY_CARE_PROVIDER_SITE_OTHER): Payer: Medicare Other | Admitting: Nurse Practitioner

## 2018-10-29 ENCOUNTER — Encounter (INDEPENDENT_AMBULATORY_CARE_PROVIDER_SITE_OTHER): Payer: Medicare Other

## 2018-10-30 ENCOUNTER — Ambulatory Visit (INDEPENDENT_AMBULATORY_CARE_PROVIDER_SITE_OTHER): Payer: Medicare Other | Admitting: Nurse Practitioner

## 2018-10-30 ENCOUNTER — Ambulatory Visit (INDEPENDENT_AMBULATORY_CARE_PROVIDER_SITE_OTHER): Payer: Medicare Other

## 2018-10-30 ENCOUNTER — Encounter (INDEPENDENT_AMBULATORY_CARE_PROVIDER_SITE_OTHER): Payer: Self-pay | Admitting: Nurse Practitioner

## 2018-10-30 VITALS — BP 160/72 | HR 82 | Resp 16 | Ht 64.0 in | Wt 151.0 lb

## 2018-10-30 DIAGNOSIS — Z992 Dependence on renal dialysis: Secondary | ICD-10-CM | POA: Diagnosis not present

## 2018-10-30 DIAGNOSIS — I120 Hypertensive chronic kidney disease with stage 5 chronic kidney disease or end stage renal disease: Secondary | ICD-10-CM | POA: Diagnosis not present

## 2018-10-30 DIAGNOSIS — N186 End stage renal disease: Secondary | ICD-10-CM

## 2018-10-30 DIAGNOSIS — I10 Essential (primary) hypertension: Secondary | ICD-10-CM

## 2018-10-30 NOTE — Progress Notes (Signed)
Subjective:    Patient ID: Kristen Francis, female    DOB: [birth year], 72 y.o.   MRN: [id number] Chief Complaint  Patient presents with  . Follow-up    HDA f/u    HPI  Kristen Francis is a 72 y.o. female that was referred to come in today via her dialysis center.  She stated that on Monday she went for dialysis and she was informed that her access Clotted off.  She also stated that there was an issue with access because she ended up having a golf ball sized hematoma as well as swelling over the upper area of her arm.  She stated that it was extremely painful and tender.  Today the area is still swollen and bruised.  The patient states that the swelling is greatly decreased from what it was previously.  The patient had a left upper extremity brachial axillary graft placed on 07/12/2018.  The patient denies hand pain or other symptoms consistent with steal phenomena.  The patient denies fever or chills at home or while on dialysis.  The patient denies amaurosis fugax or recent TIA symptoms. There are no recent neurological changes noted. The patient denies claudication symptoms or rest pain symptoms. The patient denies history of DVT, PE or superficial thrombophlebitis. The patient denies recent episodes of angina or shortness of breath.   Today the patient underwent hemodialysis access duplex which revealed a flow volume of 2583.  The graft is widely patent, however there are increased velocities at the arterial anastomosis of 592 and proximal graft of 579.  Previous studies on 07/29/2018 showed a flow volume of 3147 with arterial anastomosis velocities of 402 and proximal graft velocities of 496.   Past Medical History:  Diagnosis Date  . Anemia   . Arthritis   . Chronic kidney disease    ESRD  . Complication of anesthesia    had procedure and felt incision early 80s  . Gout   . Hyperlipemia   . Hypertension   . Macular degeneration, right eye   . Stroke [facility])      Past Surgical History:  Procedure Laterality Date  . ABDOMINAL HYSTERECTOMY    . APPENDECTOMY    . AV FISTULA PLACEMENT Left 07/12/2018   Procedure: INSERTION OF ARTERIOVENOUS (AV) GORE-TEX GRAFT ARM ( BRACHIAL AXILLARY );  Surgeon: Katha Cabal, MD;  Location: [facility] ORS;  Service: Vascular;  Laterality: Left;  . COLONOSCOPY WITH PROPOFOL N/A 01/14/2016   Procedure: COLONOSCOPY WITH PROPOFOL;  Surgeon: Manya Silvas, MD;  Location: [facility] ENDOSCOPY;  Service: Endoscopy;  Laterality: N/A;  . TONSILLECTOMY      Social History   Socioeconomic History  . Marital status: Married    Spouse name: Not on file  . Number of children: Not on file  . Years of education: Not on file  . Highest education level: Not on file  Occupational History  . Not on file  Social Needs  . Financial resource strain: Not on file  . Food insecurity:    Worry: Not on file    Inability: Not on file  . Transportation needs:    Medical: Not on file    Non-medical: Not on file  Tobacco Use  . Smoking status: Former Research scientist (life sciences)  . Smokeless tobacco: Never Used  Substance and Sexual Activity  . Alcohol use: No    Frequency: Never  . Drug use: No  . Sexual activity: Not on file  Lifestyle  . Physical activity:  Days per week: Not on file    Minutes per session: Not on file  . Stress: Not on file  Relationships  . Social connections:    Talks on phone: Not on file    Gets together: Not on file    Attends religious service: Not on file    Active member of club or organization: Not on file    Attends meetings of clubs or organizations: Not on file    Relationship status: Not on file  . Intimate partner violence:    Fear of current or ex partner: Not on file    Emotionally abused: Not on file    Physically abused: Not on file    Forced sexual activity: Not on file  Other Topics Concern  . Not on file  Social History Narrative  . Not on file    Family History  Problem Relation Age of Onset   . Cancer Mother     Allergies  Allergen Reactions  . Other Other (See Comments)    Pt does not receive Blood   . Penicillins Hives    Has patient had a PCN reaction causing immediate rash, facial/tongue/throat swelling, SOB or lightheadedness with hypotension: YES Has patient had a PCN reaction causing severe rash involving mucus membranes or skin necrosis: no Has patient had a PCN reaction that required hospitalization: no Has patient had a PCN reaction occurring within the last 10 years: no If all of the above answers are "NO", then may proceed with Cephalosporin use.   . Sodium Bicarbonate Itching     Review of Systems   Review of Systems: Negative Unless Checked Constitutional: [] Weight loss  [] Fever  [] Chills Cardiac: [] Chest pain   []  Atrial Fibrillation  [] Palpitations   [] Shortness of breath when laying flat   [] Shortness of breath with exertion. Vascular:  [] Pain in legs with walking   [] Pain in legs with standing  [] History of DVT   [] Phlebitis   [] Swelling in legs   [] Varicose veins   [] Non-healing ulcers Pulmonary:   [] Uses home oxygen   [] Productive cough   [] Hemoptysis   [] Wheeze  [] COPD   [] Asthma Neurologic:  [] Dizziness   [] Seizures   [x] History of stroke   [] History of TIA  [] Aphasia   [] Vissual changes   [] Weakness or numbness in arm   [] Weakness or numbness in leg Musculoskeletal:   [] Joint swelling   [] Joint pain   [] Low back pain  []  History of Knee Replacement Hematologic:  [] Easy bruising  [] Easy bleeding   [] Hypercoagulable state   [x] Anemic Gastrointestinal:  [] Diarrhea   [] Vomiting  [] Gastroesophageal reflux/heartburn   [] Difficulty swallowing. Genitourinary:  [x] Chronic kidney disease   [] Difficult urination  [] Anuric   [] Blood in urine Skin:  [] Rashes   [] Ulcers  Psychological:  [] History of anxiety   []  History of major depression  []  Memory Difficulties     Objective:   Physical Exam  BP (!) 160/72 (BP Location: Right Arm, Patient Position:  Sitting)   Pulse 82   Resp 16   Ht 5\' 4"  (1.626 m)   Wt 151 lb (68.5 kg)   BMI 25.92 kg/m   Gen: WD/WN, NAD Head: [place]/AT, No temporalis wasting.  Ear/Nose/Throat: Hearing grossly intact, nares w/o erythema or drainage Eyes: PER, EOMI, sclera nonicteric.  Neck: Supple, no masses.  No JVD.  Pulmonary:  Good air movement, no use of accessory muscles.  Cardiac: RRR Vascular:  Heavily bruised upper arm above proximal area of graft.  Good thrill  and bruit.  Swollen near proximally and at bruising. Vessel Right Left  Radial Palpable Palpable   Gastrointestinal: soft, non-distended. No guarding/no peritoneal signs.  Musculoskeletal: M/S 5/5 throughout.  No deformity or atrophy.  Neurologic: Pain and light touch intact in extremities.  Symmetrical.  Speech is fluent. Motor exam as listed above. Psychiatric: Judgment intact, Mood & affect appropriate for pt's clinical situation. Dermatologic: No Venous rashes. No Ulcers Noted.  No changes consistent with cellulitis. Lymph : No Cervical lymphadenopathy, no lichenification or skin changes of chronic lymphedema.      Assessment & Plan:   1. ESRD (end stage renal disease) ([place]) Today the patient underwent hemodialysis access duplex which revealed a flow volume of 2583.  The graft is widely patent, however there are increased velocities at the arterial anastomosis of 592 and proximal graft of 579.  Previous studies on 07/29/2018 showed a flow volume of 3147 with arterial anastomosis velocities of 402 and proximal graft velocities of 496.  We will bring her back in 1 month to do another hemodialysis access duplex.  Although her flow volumes are good she states that she has had some issues with clotting, per the dialysis center.  She has elevated velocities at the proximal graft and arterial anastomosis, however her arm is extremely bruised and still swollen.  I suspect that this could be the cause for the elevated velocities in these areas.  We will  bring her back to assess this in 1 month's time.  I have also advised patient that if she continues to have issues with access and clotting we may need to do a shuntogram prior to the 1 month follow-up.  The patient understood and agreed. - VAS US DUPLEX DIALYSIS ACCESS (AVF, AVG); Future  2. Hypertension, unspecified type Continue antihypertensive medications as already ordered, these medications have been reviewed and there are no changes at this time.    Current Outpatient Medications on File Prior to Visit  Medication Sig Dispense Refill  . allopurinol (ZYLOPRIM) 100 MG tablet Take 1 tablet (100 mg total) by mouth daily. 30 tablet 3  . amLODipine (NORVASC) 10 MG tablet TAKE 1 TABLET BY MOUTH ONCE DAILY 30 tablet 8  . b complex-vitamin c-folic acid (NEPHRO-VITE) 0.8 MG TABS tablet TAKE 1 TABLET BY MOUTH EVERY DAY (ON DIALYSIS DAYS, TAKE AFTER DIALYSIS TREATMENT)  4  . carvedilol (COREG) 25 MG tablet Take 25 mg by mouth 2 (two) times daily with a meal.    . cholecalciferol (VITAMIN D) 1000 units tablet Take 1,000 Units by mouth daily.     . ferrous sulfate 325 (65 FE) MG tablet Take 325 mg by mouth daily.     Marland Kitchen lidocaine-prilocaine (EMLA) cream APPLY SMALL AMOUNT TO ACCESS SITE (AVF) 3 TIMES A WEEK 1 HOUR BEFORE DIALYSIS. COVER WITH OCCLUSIVE DRESSING (SARAN WRAP)  6  . losartan (COZAAR) 50 MG tablet Take 50 mg by mouth daily.    . sevelamer carbonate (RENVELA) 800 MG tablet Take by mouth.    . torsemide (DEMADEX) 5 MG tablet Take 5 mg by mouth daily.   11  . diazepam (VALIUM) 2 MG tablet Take 1 tablet (2 mg total) by mouth every 8 (eight) hours as needed for muscle spasms. (Patient not taking: Reported on 08/30/2018) 10 tablet 0  . oxyCODONE-acetaminophen (PERCOCET) 5-325 MG tablet Take 1-2 tablets by mouth every 6 (six) hours as needed for up to 14 days for severe pain. 40 tablet 0   No current facility-administered medications on  file prior to visit.     There are no Patient  Instructions on file for this visit. Return in about 4 weeks (around 11/27/2018).   Kris Hartmann, NP  This note was completed with Sales executive.  Any errors are purely unintentional.

## 2018-11-01 DIAGNOSIS — D631 Anemia in chronic kidney disease: Secondary | ICD-10-CM | POA: Diagnosis not present

## 2018-11-01 DIAGNOSIS — E1129 Type 2 diabetes mellitus with other diabetic kidney complication: Secondary | ICD-10-CM | POA: Diagnosis not present

## 2018-11-01 DIAGNOSIS — N2581 Secondary hyperparathyroidism of renal origin: Secondary | ICD-10-CM | POA: Diagnosis not present

## 2018-11-01 DIAGNOSIS — D509 Iron deficiency anemia, unspecified: Secondary | ICD-10-CM | POA: Diagnosis not present

## 2018-11-01 DIAGNOSIS — N186 End stage renal disease: Secondary | ICD-10-CM | POA: Diagnosis not present

## 2018-11-02 DIAGNOSIS — Z992 Dependence on renal dialysis: Secondary | ICD-10-CM | POA: Diagnosis not present

## 2018-11-02 DIAGNOSIS — N040 Nephrotic syndrome with minor glomerular abnormality: Secondary | ICD-10-CM | POA: Diagnosis not present

## 2018-11-02 DIAGNOSIS — N186 End stage renal disease: Secondary | ICD-10-CM | POA: Diagnosis not present

## 2018-11-04 DIAGNOSIS — D631 Anemia in chronic kidney disease: Secondary | ICD-10-CM | POA: Diagnosis not present

## 2018-11-04 DIAGNOSIS — N186 End stage renal disease: Secondary | ICD-10-CM | POA: Diagnosis not present

## 2018-11-04 DIAGNOSIS — E1129 Type 2 diabetes mellitus with other diabetic kidney complication: Secondary | ICD-10-CM | POA: Diagnosis not present

## 2018-11-04 DIAGNOSIS — N2581 Secondary hyperparathyroidism of renal origin: Secondary | ICD-10-CM | POA: Diagnosis not present

## 2018-11-04 DIAGNOSIS — D509 Iron deficiency anemia, unspecified: Secondary | ICD-10-CM | POA: Diagnosis not present

## 2018-11-06 DIAGNOSIS — E1129 Type 2 diabetes mellitus with other diabetic kidney complication: Secondary | ICD-10-CM | POA: Diagnosis not present

## 2018-11-06 DIAGNOSIS — D509 Iron deficiency anemia, unspecified: Secondary | ICD-10-CM | POA: Diagnosis not present

## 2018-11-06 DIAGNOSIS — D631 Anemia in chronic kidney disease: Secondary | ICD-10-CM | POA: Diagnosis not present

## 2018-11-06 DIAGNOSIS — N2581 Secondary hyperparathyroidism of renal origin: Secondary | ICD-10-CM | POA: Diagnosis not present

## 2018-11-06 DIAGNOSIS — N186 End stage renal disease: Secondary | ICD-10-CM | POA: Diagnosis not present

## 2018-11-08 DIAGNOSIS — N2581 Secondary hyperparathyroidism of renal origin: Secondary | ICD-10-CM | POA: Diagnosis not present

## 2018-11-08 DIAGNOSIS — D509 Iron deficiency anemia, unspecified: Secondary | ICD-10-CM | POA: Diagnosis not present

## 2018-11-08 DIAGNOSIS — N186 End stage renal disease: Secondary | ICD-10-CM | POA: Diagnosis not present

## 2018-11-08 DIAGNOSIS — D631 Anemia in chronic kidney disease: Secondary | ICD-10-CM | POA: Diagnosis not present

## 2018-11-08 DIAGNOSIS — E1129 Type 2 diabetes mellitus with other diabetic kidney complication: Secondary | ICD-10-CM | POA: Diagnosis not present

## 2018-11-11 DIAGNOSIS — D631 Anemia in chronic kidney disease: Secondary | ICD-10-CM | POA: Diagnosis not present

## 2018-11-11 DIAGNOSIS — N2581 Secondary hyperparathyroidism of renal origin: Secondary | ICD-10-CM | POA: Diagnosis not present

## 2018-11-11 DIAGNOSIS — N186 End stage renal disease: Secondary | ICD-10-CM | POA: Diagnosis not present

## 2018-11-11 DIAGNOSIS — E1129 Type 2 diabetes mellitus with other diabetic kidney complication: Secondary | ICD-10-CM | POA: Diagnosis not present

## 2018-11-11 DIAGNOSIS — D509 Iron deficiency anemia, unspecified: Secondary | ICD-10-CM | POA: Diagnosis not present

## 2018-11-13 DIAGNOSIS — D509 Iron deficiency anemia, unspecified: Secondary | ICD-10-CM | POA: Diagnosis not present

## 2018-11-13 DIAGNOSIS — E1129 Type 2 diabetes mellitus with other diabetic kidney complication: Secondary | ICD-10-CM | POA: Diagnosis not present

## 2018-11-13 DIAGNOSIS — N186 End stage renal disease: Secondary | ICD-10-CM | POA: Diagnosis not present

## 2018-11-13 DIAGNOSIS — N2581 Secondary hyperparathyroidism of renal origin: Secondary | ICD-10-CM | POA: Diagnosis not present

## 2018-11-13 DIAGNOSIS — D631 Anemia in chronic kidney disease: Secondary | ICD-10-CM | POA: Diagnosis not present

## 2018-11-15 DIAGNOSIS — N2581 Secondary hyperparathyroidism of renal origin: Secondary | ICD-10-CM | POA: Diagnosis not present

## 2018-11-15 DIAGNOSIS — E1129 Type 2 diabetes mellitus with other diabetic kidney complication: Secondary | ICD-10-CM | POA: Diagnosis not present

## 2018-11-15 DIAGNOSIS — D509 Iron deficiency anemia, unspecified: Secondary | ICD-10-CM | POA: Diagnosis not present

## 2018-11-15 DIAGNOSIS — N186 End stage renal disease: Secondary | ICD-10-CM | POA: Diagnosis not present

## 2018-11-15 DIAGNOSIS — D631 Anemia in chronic kidney disease: Secondary | ICD-10-CM | POA: Diagnosis not present

## 2018-11-18 DIAGNOSIS — N186 End stage renal disease: Secondary | ICD-10-CM | POA: Diagnosis not present

## 2018-11-18 DIAGNOSIS — E1129 Type 2 diabetes mellitus with other diabetic kidney complication: Secondary | ICD-10-CM | POA: Diagnosis not present

## 2018-11-18 DIAGNOSIS — N2581 Secondary hyperparathyroidism of renal origin: Secondary | ICD-10-CM | POA: Diagnosis not present

## 2018-11-18 DIAGNOSIS — D509 Iron deficiency anemia, unspecified: Secondary | ICD-10-CM | POA: Diagnosis not present

## 2018-11-18 DIAGNOSIS — D631 Anemia in chronic kidney disease: Secondary | ICD-10-CM | POA: Diagnosis not present

## 2018-11-20 DIAGNOSIS — D509 Iron deficiency anemia, unspecified: Secondary | ICD-10-CM | POA: Diagnosis not present

## 2018-11-20 DIAGNOSIS — N186 End stage renal disease: Secondary | ICD-10-CM | POA: Diagnosis not present

## 2018-11-20 DIAGNOSIS — E1129 Type 2 diabetes mellitus with other diabetic kidney complication: Secondary | ICD-10-CM | POA: Diagnosis not present

## 2018-11-20 DIAGNOSIS — D631 Anemia in chronic kidney disease: Secondary | ICD-10-CM | POA: Diagnosis not present

## 2018-11-20 DIAGNOSIS — N2581 Secondary hyperparathyroidism of renal origin: Secondary | ICD-10-CM | POA: Diagnosis not present

## 2018-11-22 DIAGNOSIS — N2581 Secondary hyperparathyroidism of renal origin: Secondary | ICD-10-CM | POA: Diagnosis not present

## 2018-11-22 DIAGNOSIS — D509 Iron deficiency anemia, unspecified: Secondary | ICD-10-CM | POA: Diagnosis not present

## 2018-11-22 DIAGNOSIS — N186 End stage renal disease: Secondary | ICD-10-CM | POA: Diagnosis not present

## 2018-11-22 DIAGNOSIS — E1129 Type 2 diabetes mellitus with other diabetic kidney complication: Secondary | ICD-10-CM | POA: Diagnosis not present

## 2018-11-22 DIAGNOSIS — D631 Anemia in chronic kidney disease: Secondary | ICD-10-CM | POA: Diagnosis not present

## 2018-11-24 DIAGNOSIS — D631 Anemia in chronic kidney disease: Secondary | ICD-10-CM | POA: Diagnosis not present

## 2018-11-24 DIAGNOSIS — E1129 Type 2 diabetes mellitus with other diabetic kidney complication: Secondary | ICD-10-CM | POA: Diagnosis not present

## 2018-11-24 DIAGNOSIS — N2581 Secondary hyperparathyroidism of renal origin: Secondary | ICD-10-CM | POA: Diagnosis not present

## 2018-11-24 DIAGNOSIS — N186 End stage renal disease: Secondary | ICD-10-CM | POA: Diagnosis not present

## 2018-11-24 DIAGNOSIS — D509 Iron deficiency anemia, unspecified: Secondary | ICD-10-CM | POA: Diagnosis not present

## 2018-11-26 DIAGNOSIS — N186 End stage renal disease: Secondary | ICD-10-CM | POA: Diagnosis not present

## 2018-11-26 DIAGNOSIS — D509 Iron deficiency anemia, unspecified: Secondary | ICD-10-CM | POA: Diagnosis not present

## 2018-11-26 DIAGNOSIS — N2581 Secondary hyperparathyroidism of renal origin: Secondary | ICD-10-CM | POA: Diagnosis not present

## 2018-11-26 DIAGNOSIS — E1129 Type 2 diabetes mellitus with other diabetic kidney complication: Secondary | ICD-10-CM | POA: Diagnosis not present

## 2018-11-26 DIAGNOSIS — D631 Anemia in chronic kidney disease: Secondary | ICD-10-CM | POA: Diagnosis not present

## 2018-11-29 DIAGNOSIS — N2581 Secondary hyperparathyroidism of renal origin: Secondary | ICD-10-CM | POA: Diagnosis not present

## 2018-11-29 DIAGNOSIS — D631 Anemia in chronic kidney disease: Secondary | ICD-10-CM | POA: Diagnosis not present

## 2018-11-29 DIAGNOSIS — D509 Iron deficiency anemia, unspecified: Secondary | ICD-10-CM | POA: Diagnosis not present

## 2018-11-29 DIAGNOSIS — N186 End stage renal disease: Secondary | ICD-10-CM | POA: Diagnosis not present

## 2018-11-29 DIAGNOSIS — E1129 Type 2 diabetes mellitus with other diabetic kidney complication: Secondary | ICD-10-CM | POA: Diagnosis not present

## 2018-12-01 DIAGNOSIS — N2581 Secondary hyperparathyroidism of renal origin: Secondary | ICD-10-CM | POA: Diagnosis not present

## 2018-12-01 DIAGNOSIS — D631 Anemia in chronic kidney disease: Secondary | ICD-10-CM | POA: Diagnosis not present

## 2018-12-01 DIAGNOSIS — N186 End stage renal disease: Secondary | ICD-10-CM | POA: Diagnosis not present

## 2018-12-01 DIAGNOSIS — E1129 Type 2 diabetes mellitus with other diabetic kidney complication: Secondary | ICD-10-CM | POA: Diagnosis not present

## 2018-12-01 DIAGNOSIS — D509 Iron deficiency anemia, unspecified: Secondary | ICD-10-CM | POA: Diagnosis not present

## 2018-12-02 ENCOUNTER — Other Ambulatory Visit: Payer: Self-pay

## 2018-12-02 ENCOUNTER — Ambulatory Visit (INDEPENDENT_AMBULATORY_CARE_PROVIDER_SITE_OTHER): Payer: Medicare Other

## 2018-12-02 ENCOUNTER — Encounter (INDEPENDENT_AMBULATORY_CARE_PROVIDER_SITE_OTHER): Payer: Self-pay | Admitting: Vascular Surgery

## 2018-12-02 ENCOUNTER — Ambulatory Visit (INDEPENDENT_AMBULATORY_CARE_PROVIDER_SITE_OTHER): Payer: Medicare Other | Admitting: Vascular Surgery

## 2018-12-02 VITALS — BP 140/70 | HR 78 | Ht 64.0 in | Wt 147.0 lb

## 2018-12-02 DIAGNOSIS — I120 Hypertensive chronic kidney disease with stage 5 chronic kidney disease or end stage renal disease: Secondary | ICD-10-CM | POA: Diagnosis not present

## 2018-12-02 DIAGNOSIS — T829XXA Unspecified complication of cardiac and vascular prosthetic device, implant and graft, initial encounter: Secondary | ICD-10-CM | POA: Diagnosis not present

## 2018-12-02 DIAGNOSIS — N186 End stage renal disease: Secondary | ICD-10-CM | POA: Diagnosis not present

## 2018-12-02 DIAGNOSIS — I10 Essential (primary) hypertension: Secondary | ICD-10-CM

## 2018-12-02 NOTE — Progress Notes (Signed)
MRN : [id number]  Kristen Francis is a 72 y.o. (1946/06/14) female who presents with chief complaint of  Chief Complaint  Patient presents with  . Follow-up  .  History of Present Illness:   left brachial axillary arteriovenous graft placement done 07/12/2018.  The patient returns to the office for followup of their dialysis access. The function of the access has been stable. The patient denies increased bleeding time or increased recirculation. Patient denies difficulty with cannulation. The patient denies hand pain or other symptoms consistent with steal phenomena.  No significant arm swelling.  The patient denies redness or swelling at the access site. The patient denies fever or chills at home or while on dialysis.  The patient denies amaurosis fugax or recent TIA symptoms. There are no recent neurological changes noted. The patient denies claudication symptoms or rest pain symptoms. The patient denies history of DVT, PE or superficial thrombophlebitis. The patient denies recent episodes of angina or shortness of breath.   Duplex ultrasound of the AV access shows a patent access.  No focal hemodynamically significant stenosis is noted but there is a mild to moderate increase at the venous anastomosis.        Current Meds  Medication Sig  . allopurinol (ZYLOPRIM) 100 MG tablet Take 1 tablet (100 mg total) by mouth daily.  Marland Kitchen amLODipine (NORVASC) 10 MG tablet TAKE 1 TABLET BY MOUTH ONCE DAILY  . b complex-vitamin c-folic acid (NEPHRO-VITE) 0.8 MG TABS tablet TAKE 1 TABLET BY MOUTH EVERY DAY (ON DIALYSIS DAYS, TAKE AFTER DIALYSIS TREATMENT)  . carvedilol (COREG) 25 MG tablet Take 25 mg by mouth 2 (two) times daily with a meal.  . cholecalciferol (VITAMIN D) 1000 units tablet Take 1,000 Units by mouth daily.   . ferrous sulfate 325 (65 FE) MG tablet Take 325 mg by mouth daily.   Marland Kitchen lidocaine-prilocaine (EMLA) cream APPLY SMALL AMOUNT TO ACCESS SITE (AVF) 3 TIMES A WEEK 1 HOUR  BEFORE DIALYSIS. COVER WITH OCCLUSIVE DRESSING (SARAN WRAP)  . losartan (COZAAR) 50 MG tablet Take 50 mg by mouth daily.  . sevelamer carbonate (RENVELA) 800 MG tablet Take by mouth.  . torsemide (DEMADEX) 5 MG tablet Take 5 mg by mouth daily.     Past Medical History:  Diagnosis Date  . Anemia   . Arthritis   . Chronic kidney disease    ESRD  . Complication of anesthesia    had procedure and felt incision early 80s  . Gout   . Hyperlipemia   . Hypertension   . Macular degeneration, right eye   . Stroke [facility])     Past Surgical History:  Procedure Laterality Date  . ABDOMINAL HYSTERECTOMY    . APPENDECTOMY    . AV FISTULA PLACEMENT Left 07/12/2018   Procedure: INSERTION OF ARTERIOVENOUS (AV) GORE-TEX GRAFT ARM ( BRACHIAL AXILLARY );  Surgeon: Katha Cabal, MD;  Location: [facility] ORS;  Service: Vascular;  Laterality: Left;  . COLONOSCOPY WITH PROPOFOL N/A 01/14/2016   Procedure: COLONOSCOPY WITH PROPOFOL;  Surgeon: Manya Silvas, MD;  Location: [facility] ENDOSCOPY;  Service: Endoscopy;  Laterality: N/A;  . TONSILLECTOMY      Social History Social History   Tobacco Use  . Smoking status: Former Research scientist (life sciences)  . Smokeless tobacco: Never Used  Substance Use Topics  . Alcohol use: No    Frequency: Never  . Drug use: No    Family History Family History  Problem Relation Age of Onset  . Cancer Mother  Allergies  Allergen Reactions  . Other Other (See Comments)    Pt does not receive Blood   . Penicillins Hives    Has patient had a PCN reaction causing immediate rash, facial/tongue/throat swelling, SOB or lightheadedness with hypotension: YES Has patient had a PCN reaction causing severe rash involving mucus membranes or skin necrosis: no Has patient had a PCN reaction that required hospitalization: no Has patient had a PCN reaction occurring within the last 10 years: no If all of the above answers are "NO", then may proceed with Cephalosporin use.   . Sodium Bicarbonate  Itching     REVIEW OF SYSTEMS (Negative unless checked)  Constitutional: [] Weight loss  [] Fever  [] Chills Cardiac: [] Chest pain   [] Chest pressure   [] Palpitations   [] Shortness of breath when laying flat   [] Shortness of breath with exertion. Vascular:  [] Pain in legs with walking   [] Pain in legs at rest  [] History of DVT   [] Phlebitis   [] Swelling in legs   [] Varicose veins   [] Non-healing ulcers Pulmonary:   [] Uses home oxygen   [] Productive cough   [] Hemoptysis   [] Wheeze  [] COPD   [] Asthma Neurologic:  [] Dizziness   [] Seizures   [] History of stroke   [] History of TIA  [] Aphasia   [] Vissual changes   [] Weakness or numbness in arm   [] Weakness or numbness in leg Musculoskeletal:   [] Joint swelling   [] Joint pain   [] Low back pain Hematologic:  [] Easy bruising  [] Easy bleeding   [] Hypercoagulable state   [] Anemic Gastrointestinal:  [] Diarrhea   [] Vomiting  [] Gastroesophageal reflux/heartburn   [] Difficulty swallowing. Genitourinary:  [x] Chronic kidney disease   [] Difficult urination  [] Frequent urination   [] Blood in urine Skin:  [] Rashes   [] Ulcers  Psychological:  [] History of anxiety   []  History of major depression.  Physical Examination  Vitals:   12/02/18 1404  BP: 140/70  Pulse: 78  Weight: 147 lb (66.7 kg)  Height: 5\' 4"  (1.626 m)   Body mass index is 25.23 kg/m. Gen: WD/WN, NAD Head: [place]/AT, No temporalis wasting.  Ear/Nose/Throat: Hearing grossly intact, nares w/o erythema or drainage Eyes: PER, EOMI, sclera nonicteric.  Neck: Supple, no large masses.   Pulmonary:  Good air movement, no audible wheezing bilaterally, no use of accessory muscles.  Cardiac: RRR, no JVD Vascular: Mild pulsatility to the left AV graft good thrill continuous bruit Vessel Right Left  Radial Palpable Palpable  Brachial Palpable Palpable  Gastrointestinal: Non-distended. No guarding/no peritoneal signs.  Musculoskeletal: M/S 5/5 throughout.  No deformity or atrophy.  Neurologic: CN 2-12  intact. Symmetrical.  Speech is fluent. Motor exam as listed above. Psychiatric: Judgment intact, Mood & affect appropriate for pt's clinical situation. Dermatologic: No rashes or ulcers noted.  No changes consistent with cellulitis. Lymph : No lichenification or skin changes of chronic lymphedema.  CBC Lab Results  Component Value Date   WBC 9.6 08/23/2018   HGB 8.2 (L) 08/23/2018   HCT 25.4 (L) 08/23/2018   MCV 84.0 08/23/2018   PLT 298 08/23/2018    BMET    Component Value Date/Time   NA 141 08/23/2018 1120   K 4.0 08/23/2018 1120   CL 109 08/23/2018 1120   CO2 22 08/23/2018 1120   GLUCOSE 128 (H) 08/23/2018 1120   BUN 55 (H) 08/23/2018 1120   CREATININE 5.25 (H) 08/23/2018 1120   CALCIUM 9.2 08/23/2018 1120   GFRNONAA 7 (L) 08/23/2018 1120   GFRAA 9 (L) 08/23/2018 1120  CrCl cannot be calculated (Patient's most recent lab result is older than the maximum 21 days allowed.).  COAG Lab Results  Component Value Date   INR 1.04 07/10/2018   INR 1.04 11/09/2017    Radiology No results found.  Assessment/Plan 1. Complication from renal dialysis device, initial encounter Recommend:  The patient is doing well and currently has adequate dialysis access. The patient's dialysis center is not reporting any access issues. Flow pattern is now showing a mild to moderate stenosis at the venous anastomosis when compared to the prior ultrasound.  This has not reached hemodynamic significance and her volume flow is still 1600 and therefore I do not recommend angiography and intervention at this time.  The patient should continue surveillance as ordered.  The patient should have a duplex ultrasound of the dialysis access in 6 months.  The patient will follow-up with me in the office after each ultrasound   - VAS [place] (AVF, AVG); Future  2. End stage renal disease ([place]) Continue dialysis 3 times a week without interruption.  3. Hypertension, unspecified  type Continue antihypertensive medications as already ordered, these medications have been reviewed and there are no changes at this time.    Hortencia Pilar, MD  12/02/2018 2:34 PM

## 2018-12-03 DIAGNOSIS — N186 End stage renal disease: Secondary | ICD-10-CM | POA: Diagnosis not present

## 2018-12-03 DIAGNOSIS — Z992 Dependence on renal dialysis: Secondary | ICD-10-CM | POA: Diagnosis not present

## 2018-12-03 DIAGNOSIS — N040 Nephrotic syndrome with minor glomerular abnormality: Secondary | ICD-10-CM | POA: Diagnosis not present

## 2018-12-06 DIAGNOSIS — N2581 Secondary hyperparathyroidism of renal origin: Secondary | ICD-10-CM | POA: Diagnosis not present

## 2018-12-06 DIAGNOSIS — N186 End stage renal disease: Secondary | ICD-10-CM | POA: Diagnosis not present

## 2018-12-06 DIAGNOSIS — E1129 Type 2 diabetes mellitus with other diabetic kidney complication: Secondary | ICD-10-CM | POA: Diagnosis not present

## 2018-12-06 DIAGNOSIS — D509 Iron deficiency anemia, unspecified: Secondary | ICD-10-CM | POA: Diagnosis not present

## 2018-12-09 DIAGNOSIS — N2581 Secondary hyperparathyroidism of renal origin: Secondary | ICD-10-CM | POA: Diagnosis not present

## 2018-12-09 DIAGNOSIS — D509 Iron deficiency anemia, unspecified: Secondary | ICD-10-CM | POA: Diagnosis not present

## 2018-12-09 DIAGNOSIS — E1129 Type 2 diabetes mellitus with other diabetic kidney complication: Secondary | ICD-10-CM | POA: Diagnosis not present

## 2018-12-09 DIAGNOSIS — N186 End stage renal disease: Secondary | ICD-10-CM | POA: Diagnosis not present

## 2018-12-11 DIAGNOSIS — E1129 Type 2 diabetes mellitus with other diabetic kidney complication: Secondary | ICD-10-CM | POA: Diagnosis not present

## 2018-12-11 DIAGNOSIS — N2581 Secondary hyperparathyroidism of renal origin: Secondary | ICD-10-CM | POA: Diagnosis not present

## 2018-12-11 DIAGNOSIS — D509 Iron deficiency anemia, unspecified: Secondary | ICD-10-CM | POA: Diagnosis not present

## 2018-12-11 DIAGNOSIS — N186 End stage renal disease: Secondary | ICD-10-CM | POA: Diagnosis not present

## 2018-12-13 DIAGNOSIS — E1129 Type 2 diabetes mellitus with other diabetic kidney complication: Secondary | ICD-10-CM | POA: Diagnosis not present

## 2018-12-13 DIAGNOSIS — N2581 Secondary hyperparathyroidism of renal origin: Secondary | ICD-10-CM | POA: Diagnosis not present

## 2018-12-13 DIAGNOSIS — D509 Iron deficiency anemia, unspecified: Secondary | ICD-10-CM | POA: Diagnosis not present

## 2018-12-13 DIAGNOSIS — N186 End stage renal disease: Secondary | ICD-10-CM | POA: Diagnosis not present

## 2018-12-16 DIAGNOSIS — N186 End stage renal disease: Secondary | ICD-10-CM | POA: Diagnosis not present

## 2018-12-16 DIAGNOSIS — D509 Iron deficiency anemia, unspecified: Secondary | ICD-10-CM | POA: Diagnosis not present

## 2018-12-16 DIAGNOSIS — N2581 Secondary hyperparathyroidism of renal origin: Secondary | ICD-10-CM | POA: Diagnosis not present

## 2018-12-16 DIAGNOSIS — E1129 Type 2 diabetes mellitus with other diabetic kidney complication: Secondary | ICD-10-CM | POA: Diagnosis not present

## 2018-12-18 DIAGNOSIS — N2581 Secondary hyperparathyroidism of renal origin: Secondary | ICD-10-CM | POA: Diagnosis not present

## 2018-12-18 DIAGNOSIS — D509 Iron deficiency anemia, unspecified: Secondary | ICD-10-CM | POA: Diagnosis not present

## 2018-12-18 DIAGNOSIS — E1129 Type 2 diabetes mellitus with other diabetic kidney complication: Secondary | ICD-10-CM | POA: Diagnosis not present

## 2018-12-18 DIAGNOSIS — N186 End stage renal disease: Secondary | ICD-10-CM | POA: Diagnosis not present

## 2018-12-20 ENCOUNTER — Telehealth (INDEPENDENT_AMBULATORY_CARE_PROVIDER_SITE_OTHER): Payer: Self-pay

## 2018-12-20 DIAGNOSIS — N186 End stage renal disease: Secondary | ICD-10-CM | POA: Diagnosis not present

## 2018-12-20 DIAGNOSIS — E1129 Type 2 diabetes mellitus with other diabetic kidney complication: Secondary | ICD-10-CM | POA: Diagnosis not present

## 2018-12-20 DIAGNOSIS — D509 Iron deficiency anemia, unspecified: Secondary | ICD-10-CM | POA: Diagnosis not present

## 2018-12-20 DIAGNOSIS — N2581 Secondary hyperparathyroidism of renal origin: Secondary | ICD-10-CM | POA: Diagnosis not present

## 2018-12-20 NOTE — Telephone Encounter (Signed)
Please call and schedule patient, advise her if develops a fever to go to the emergency room or urgent care. Thank you

## 2018-12-20 NOTE — Telephone Encounter (Signed)
We can get her to come in sometime Monday for an HDA and appointment with me or Dr. Delana Meyer.  If she does develop a fever during the weekend she should go to ER or urgent care, as it could be something as simple as a cold or something more severe.

## 2018-12-20 NOTE — Telephone Encounter (Signed)
Patient called and left a message on the triage line and stated that her fistula is clotted and she needs to be seen because her arm is swollen. Also mentioned she feels as if she has a fever. Please advise

## 2018-12-23 ENCOUNTER — Other Ambulatory Visit (INDEPENDENT_AMBULATORY_CARE_PROVIDER_SITE_OTHER): Payer: Self-pay | Admitting: Nurse Practitioner

## 2018-12-23 ENCOUNTER — Other Ambulatory Visit: Payer: Self-pay

## 2018-12-23 ENCOUNTER — Encounter (INDEPENDENT_AMBULATORY_CARE_PROVIDER_SITE_OTHER): Payer: Self-pay | Admitting: Nurse Practitioner

## 2018-12-23 ENCOUNTER — Ambulatory Visit (INDEPENDENT_AMBULATORY_CARE_PROVIDER_SITE_OTHER): Payer: Medicare Other | Admitting: Nurse Practitioner

## 2018-12-23 ENCOUNTER — Ambulatory Visit (INDEPENDENT_AMBULATORY_CARE_PROVIDER_SITE_OTHER): Payer: Medicare Other

## 2018-12-23 VITALS — BP 163/75 | HR 80 | Resp 16 | Ht 66.0 in | Wt 149.0 lb

## 2018-12-23 DIAGNOSIS — I120 Hypertensive chronic kidney disease with stage 5 chronic kidney disease or end stage renal disease: Secondary | ICD-10-CM

## 2018-12-23 DIAGNOSIS — M7989 Other specified soft tissue disorders: Secondary | ICD-10-CM

## 2018-12-23 DIAGNOSIS — T82898A Other specified complication of vascular prosthetic devices, implants and grafts, initial encounter: Secondary | ICD-10-CM | POA: Diagnosis not present

## 2018-12-23 DIAGNOSIS — R600 Localized edema: Secondary | ICD-10-CM | POA: Diagnosis not present

## 2018-12-23 DIAGNOSIS — T829XXD Unspecified complication of cardiac and vascular prosthetic device, implant and graft, subsequent encounter: Secondary | ICD-10-CM

## 2018-12-23 DIAGNOSIS — N186 End stage renal disease: Secondary | ICD-10-CM | POA: Diagnosis not present

## 2018-12-23 DIAGNOSIS — Z992 Dependence on renal dialysis: Secondary | ICD-10-CM | POA: Diagnosis not present

## 2018-12-23 DIAGNOSIS — T829XXA Unspecified complication of cardiac and vascular prosthetic device, implant and graft, initial encounter: Secondary | ICD-10-CM

## 2018-12-23 DIAGNOSIS — I10 Essential (primary) hypertension: Secondary | ICD-10-CM

## 2018-12-23 MED ORDER — DOXYCYCLINE HYCLATE 100 MG PO CAPS: 100.0000 mg | ORAL_CAPSULE | Freq: Two times a day (BID) | ORAL | 0 refills | Status: DC

## 2018-12-24 ENCOUNTER — Telehealth (INDEPENDENT_AMBULATORY_CARE_PROVIDER_SITE_OTHER): Payer: Self-pay | Admitting: Nurse Practitioner

## 2018-12-24 MED ORDER — CLINDAMYCIN PHOSPHATE 300 MG/50ML IV SOLN: 300.0000 mg | Freq: Once | INTRAVENOUS | Status: DC

## 2018-12-24 NOTE — Telephone Encounter (Signed)
Pt left vm on nurse line stating her kidney doctor was concerned that she has not had dialysis since last Monday and that pt would need to get it this week. Per pt on vm she was awaiting to hear from [facility] because she stated she needed to contact the hospital. Pt was unsure of test that she would be having done.   She left a call back number of [phone]

## 2018-12-24 NOTE — Telephone Encounter (Signed)
Spoke with the patient and gave her her pre-procedure instructions along with the day of 12/25/2018 with a 2:00 pm arrival time.

## 2018-12-24 NOTE — Telephone Encounter (Signed)
I will be forwarding this to Mickel Baas to get her scheduled, she will contact the patient directly

## 2018-12-25 ENCOUNTER — Ambulatory Visit
Admission: RE | Admit: 2018-12-25 | Discharge: 2018-12-25 | Disposition: A | Discharge status: Home / Self Care | Payer: Medicare Other | Attending: Vascular Surgery | Admitting: Vascular Surgery

## 2018-12-25 ENCOUNTER — Encounter (INDEPENDENT_AMBULATORY_CARE_PROVIDER_SITE_OTHER): Payer: Self-pay | Admitting: Nurse Practitioner

## 2018-12-25 ENCOUNTER — Other Ambulatory Visit (INDEPENDENT_AMBULATORY_CARE_PROVIDER_SITE_OTHER): Payer: Self-pay | Admitting: Nurse Practitioner

## 2018-12-25 ENCOUNTER — Other Ambulatory Visit: Payer: Self-pay

## 2018-12-25 ENCOUNTER — Encounter
Admission: RE | Disposition: A | Discharge status: Home / Self Care | Payer: Self-pay | Source: Home / Self Care | Attending: Vascular Surgery

## 2018-12-25 DIAGNOSIS — Z95820 Peripheral vascular angioplasty status with implants and grafts: Secondary | ICD-10-CM | POA: Insufficient documentation

## 2018-12-25 DIAGNOSIS — N186 End stage renal disease: Secondary | ICD-10-CM | POA: Insufficient documentation

## 2018-12-25 DIAGNOSIS — T82858A Stenosis of vascular prosthetic devices, implants and grafts, initial encounter: Secondary | ICD-10-CM | POA: Diagnosis not present

## 2018-12-25 DIAGNOSIS — M1990 Unspecified osteoarthritis, unspecified site: Secondary | ICD-10-CM | POA: Insufficient documentation

## 2018-12-25 DIAGNOSIS — I120 Hypertensive chronic kidney disease with stage 5 chronic kidney disease or end stage renal disease: Secondary | ICD-10-CM | POA: Insufficient documentation

## 2018-12-25 DIAGNOSIS — Z87891 Personal history of nicotine dependence: Secondary | ICD-10-CM | POA: Insufficient documentation

## 2018-12-25 DIAGNOSIS — Z90710 Acquired absence of both cervix and uterus: Secondary | ICD-10-CM | POA: Diagnosis not present

## 2018-12-25 DIAGNOSIS — H3530 Unspecified macular degeneration: Secondary | ICD-10-CM | POA: Insufficient documentation

## 2018-12-25 DIAGNOSIS — Z880 Allergy status to penicillin: Secondary | ICD-10-CM | POA: Insufficient documentation

## 2018-12-25 DIAGNOSIS — Z888 Allergy status to other drugs, medicaments and biological substances status: Secondary | ICD-10-CM | POA: Diagnosis not present

## 2018-12-25 DIAGNOSIS — Y832 Surgical operation with anastomosis, bypass or graft as the cause of abnormal reaction of the patient, or of later complication, without mention of misadventure at the time of the procedure: Secondary | ICD-10-CM | POA: Diagnosis not present

## 2018-12-25 DIAGNOSIS — E785 Hyperlipidemia, unspecified: Secondary | ICD-10-CM | POA: Diagnosis not present

## 2018-12-25 DIAGNOSIS — M109 Gout, unspecified: Secondary | ICD-10-CM | POA: Insufficient documentation

## 2018-12-25 DIAGNOSIS — Z992 Dependence on renal dialysis: Secondary | ICD-10-CM | POA: Insufficient documentation

## 2018-12-25 DIAGNOSIS — Z8673 Personal history of transient ischemic attack (TIA), and cerebral infarction without residual deficits: Secondary | ICD-10-CM | POA: Diagnosis not present

## 2018-12-25 HISTORY — PX: A/V FISTULAGRAM: CATH118298

## 2018-12-25 LAB — POTASSIUM (ARMC VASCULAR LAB ONLY): Potassium (ARMC vascular lab): 5.1 (ref 3.5–5.1)

## 2018-12-25 SURGERY — A/V FISTULAGRAM
Anesthesia: Moderate Sedation | Site: Arm Upper | Laterality: Left

## 2018-12-25 MED ORDER — CLINDAMYCIN PHOSPHATE 300 MG/50ML IV SOLN
300.0000 mg | Freq: Once | INTRAVENOUS | Status: AC
  Administered 2018-12-25: 300 mg via INTRAVENOUS

## 2018-12-25 MED ORDER — ONDANSETRON HCL 4 MG/2ML IJ SOLN: 4.0000 mg | Freq: Four times a day (QID) | INTRAMUSCULAR | Status: DC | PRN

## 2018-12-25 MED ORDER — HEPARIN SODIUM (PORCINE) 1000 UNIT/ML IJ SOLN
INTRAMUSCULAR | Status: AC
  Filled 2018-12-25: qty 1

## 2018-12-25 MED ORDER — FAMOTIDINE 20 MG PO TABS: 40.0000 mg | ORAL_TABLET | Freq: Once | ORAL | Status: DC | PRN

## 2018-12-25 MED ORDER — DIPHENHYDRAMINE HCL 50 MG/ML IJ SOLN: 50.0000 mg | Freq: Once | INTRAMUSCULAR | Status: DC | PRN

## 2018-12-25 MED ORDER — SODIUM CHLORIDE 0.9 % IV SOLN
INTRAVENOUS | Status: DC
  Administered 2018-12-25: 1000 mL via INTRAVENOUS

## 2018-12-25 MED ORDER — CLINDAMYCIN PHOSPHATE 300 MG/50ML IV SOLN
INTRAVENOUS | Status: AC
  Administered 2018-12-25: 300 mg via INTRAVENOUS
  Filled 2018-12-25: qty 50

## 2018-12-25 MED ORDER — IOPAMIDOL (ISOVUE-300) INJECTION 61%
INTRAVENOUS | Status: DC | PRN
  Administered 2018-12-25: 25 mL via INTRA_ARTERIAL

## 2018-12-25 MED ORDER — MIDAZOLAM HCL 2 MG/2ML IJ SOLN
INTRAMUSCULAR | Status: DC | PRN
  Administered 2018-12-25: 1 mg via INTRAVENOUS
  Administered 2018-12-25: 2 mg via INTRAVENOUS

## 2018-12-25 MED ORDER — HEPARIN SODIUM (PORCINE) 1000 UNIT/ML IJ SOLN
INTRAMUSCULAR | Status: DC | PRN
  Administered 2018-12-25: 2000 [IU] via INTRAVENOUS

## 2018-12-25 MED ORDER — FENTANYL CITRATE (PF) 100 MCG/2ML IJ SOLN
INTRAMUSCULAR | Status: DC | PRN
  Administered 2018-12-25: 50 ug via INTRAVENOUS
  Administered 2018-12-25: 25 ug via INTRAVENOUS

## 2018-12-25 MED ORDER — MIDAZOLAM HCL 5 MG/5ML IJ SOLN
INTRAMUSCULAR | Status: AC
  Filled 2018-12-25: qty 5

## 2018-12-25 MED ORDER — METHYLPREDNISOLONE SODIUM SUCC 125 MG IJ SOLR: 125.0000 mg | Freq: Once | INTRAMUSCULAR | Status: DC | PRN

## 2018-12-25 MED ORDER — FENTANYL CITRATE (PF) 100 MCG/2ML IJ SOLN
INTRAMUSCULAR | Status: AC
  Filled 2018-12-25: qty 2

## 2018-12-25 SURGICAL SUPPLY — 14 items
BALLN DORADO 8X60X80 (BALLOONS) ×3
BALLOON DORADO 8X60X80 (BALLOONS) ×1 IMPLANT
COVER PROBE U/S 5X48 (MISCELLANEOUS) ×3 IMPLANT
DEVICE PRESTO INFLATION (MISCELLANEOUS) ×3 IMPLANT
DRAPE BRACHIAL (DRAPES) ×3 IMPLANT
NEEDLE ENTRY 21GA 7CM ECHOTIP (NEEDLE) ×3 IMPLANT
PACK ANGIOGRAPHY (CUSTOM PROCEDURE TRAY) ×3 IMPLANT
SET INTRO CAPELLA COAXIAL (SET/KITS/TRAYS/PACK) ×3 IMPLANT
SHEATH BRITE TIP 6FRX5.5 (SHEATH) ×3 IMPLANT
SHEATH BRITE TIP 7FRX5.5 (SHEATH) ×3 IMPLANT
STENT COVERA FLARED 8X60X80 (Permanent Stent) ×3 IMPLANT
SUT MNCRL AB 4-0 PS2 18 (SUTURE) ×3 IMPLANT
TOWEL OR 17X26 4PK STRL BLUE (TOWEL DISPOSABLE) ×3 IMPLANT
WIRE MAGIC TORQUE 260C (WIRE) ×3 IMPLANT

## 2018-12-25 NOTE — H&P (Signed)
[facility] History & Physical Update  The patient was interviewed and re-examined.  The patient's previous History and Physical has been reviewed and is unchanged.  There is no change in the plan of care. We plan to proceed with the scheduled procedure.  Hortencia Pilar, MD  12/25/2018, 4:19 PM

## 2018-12-25 NOTE — Progress Notes (Signed)
Subjective:    Patient ID: Kristen Francis, female    DOB: 08-28-1946, 73 y.o.   MRN: [id number] Chief Complaint  Patient presents with  . Follow-up    ultrasound    HPI  Kristen Francis is a 73 y.o. female that presents today with a red, swollen and painful graft site.  She states that she was at dialysis and following two sticks she became swollen.  She states that this happened immediately after dialysis.  She states that the arm is painful up to her shoulder.  She denies any fever, chills, nausea or vomiting.  She denies chest pain or shortness of breath.  She underwent an ultrasound of her left brachial axillary graft today which revealed a flow volume of 2467, with hemodynamically significant velocity elevation at the venous anastomosis.This area appears stenotic.  There are also two hematomas, one located in the mid upper arm measuring 5.77 cm by 3.14 cm and the other is located in the proximal upper arm medially with a measurement of 4.83 cm x 3.28 cms.   Past Medical History:  Diagnosis Date  . Anemia   . Arthritis   . Chronic kidney disease    ESRD  . Complication of anesthesia    had procedure and felt incision early 80s  . Gout   . Hyperlipemia   . Hypertension   . Macular degeneration, right eye   . Stroke [facility])     Past Surgical History:  Procedure Laterality Date  . ABDOMINAL HYSTERECTOMY    . APPENDECTOMY    . AV FISTULA PLACEMENT Left 07/12/2018   Procedure: INSERTION OF ARTERIOVENOUS (AV) GORE-TEX GRAFT ARM ( BRACHIAL AXILLARY );  Surgeon: Katha Cabal, MD;  Location: [facility] ORS;  Service: Vascular;  Laterality: Left;  . COLONOSCOPY WITH PROPOFOL N/A 01/14/2016   Procedure: COLONOSCOPY WITH PROPOFOL;  Surgeon: Manya Silvas, MD;  Location: [facility] ENDOSCOPY;  Service: Endoscopy;  Laterality: N/A;  . TONSILLECTOMY      Social History   Socioeconomic History  . Marital status: Married    Spouse name: Not on file  . Number of children: Not on  file  . Years of education: Not on file  . Highest education level: Not on file  Occupational History  . Not on file  Social Needs  . Financial resource strain: Not on file  . Food insecurity:    Worry: Not on file    Inability: Not on file  . Transportation needs:    Medical: Not on file    Non-medical: Not on file  Tobacco Use  . Smoking status: Former Research scientist (life sciences)  . Smokeless tobacco: Never Used  Substance and Sexual Activity  . Alcohol use: No    Frequency: Never  . Drug use: No  . Sexual activity: Not on file  Lifestyle  . Physical activity:    Days per week: Not on file    Minutes per session: Not on file  . Stress: Not on file  Relationships  . Social connections:    Talks on phone: Not on file    Gets together: Not on file    Attends religious service: Not on file    Active member of club or organization: Not on file    Attends meetings of clubs or organizations: Not on file    Relationship status: Not on file  . Intimate partner violence:    Fear of current or ex partner: Not on file    Emotionally abused: Not  on file    Physically abused: Not on file    Forced sexual activity: Not on file  Other Topics Concern  . Not on file  Social History Narrative  . Not on file    Family History  Problem Relation Age of Onset  . Cancer Mother     Allergies  Allergen Reactions  . Other Other (See Comments)    Pt does not receive Blood   . Penicillins Hives    Has patient had a PCN reaction causing immediate rash, facial/tongue/throat swelling, SOB or lightheadedness with hypotension: YES Has patient had a PCN reaction causing severe rash involving mucus membranes or skin necrosis: no Has patient had a PCN reaction that required hospitalization: no Has patient had a PCN reaction occurring within the last 10 years: no If all of the above answers are "NO", then may proceed with Cephalosporin use.   . Sodium Bicarbonate Itching     Review of Systems   Review of  Systems: Negative Unless Checked Constitutional: [] Weight loss  [] Fever  [] Chills Cardiac: [] Chest pain   []  Atrial Fibrillation  [] Palpitations   [] Shortness of breath when laying flat   [] Shortness of breath with exertion. [] Shortness of breath at rest Vascular:  [] Pain in legs with walking   [] Pain in legs with standing [] Pain in legs when laying flat   [] Claudication    [] Pain in feet when laying flat    [] History of DVT   [x] Phlebitis   [] Swelling in legs   [] Varicose veins   [] Non-healing ulcers Pulmonary:   [] Uses home oxygen   [] Productive cough   [] Hemoptysis   [] Wheeze  [] COPD   [] Asthma Neurologic:  [] Dizziness   [] Seizures  [] Blackouts [x] History of stroke   [] History of TIA  [] Aphasia   [] Temporary Blindness   [] Weakness or numbness in arm   [] Weakness or numbness in leg Musculoskeletal:   [] Joint swelling   [] Joint pain   [] Low back pain  []  History of Knee Replacement [] Arthritis [] back Surgeries  []  Spinal Stenosis    Hematologic:  [] Easy bruising  [] Easy bleeding   [] Hypercoagulable state   [] Anemic Gastrointestinal:  [] Diarrhea   [] Vomiting  [] Gastroesophageal reflux/heartburn   [] Difficulty swallowing. [] Abdominal pain Genitourinary:  [x] Chronic kidney disease   [] Difficult urination  [] Anuric   [] Blood in urine [] Frequent urination  [] Burning with urination   [] Hematuria Skin:  [] Rashes   [] Ulcers [] Wounds Psychological:  [] History of anxiety   []  History of major depression  [x]  Memory Difficulties     Objective:   Physical Exam  BP (!) 163/75   Pulse 80   Resp 16   Ht 5\' 6"  (1.676 m)   Wt 149 lb (67.6 kg)   BMI 24.05 kg/m   Gen: WD/WN, NAD Head: [place]/AT, No temporalis wasting.  Ear/Nose/Throat: Hearing grossly intact, nares w/o erythema or drainage Eyes: PER, EOMI, sclera nonicteric.  Neck: Supple, no masses.  No JVD.  Pulmonary:  Good air movement, no use of accessory muscles.  Cardiac: RRR Vascular: good bruit, hard to feel thrill through swelling.  Entire upper  arm is swollen, hot and slightly erethematous Vessel Right Left  Radial Palpable Palpable   Gastrointestinal: soft, non-distended. No guarding/no peritoneal signs.  Musculoskeletal: M/S 5/5 throughout.  No deformity or atrophy.  Neurologic: Pain and light touch intact in extremities.  Symmetrical.  Speech is fluent. Motor exam as listed above. Psychiatric: Judgment intact, Mood & affect appropriate for pt's clinical situation. Dermatologic: No Venous rashes. No Ulcers Noted.  Warmth, redness and pain of arm concerning for cellulitis.  Lymph : No Cervical lymphadenopathy, no lichenification or skin changes of chronic lymphedema.      Assessment & Plan:   1. Complication from renal dialysis device, initial encounter  Recommend:  The patient is experiencing increasing problems with their dialysis access.  Patient should have a shuntogram of the left brachial axillary graft, with the intention for intervention.  The intention for intervention is to restore appropriate flow and prevent thrombosis and possible loss of the access.  As well as improve the quality of dialysis therapy.  The risks, benefits and alternative therapies were reviewed in detail with the patient.  All questions were answered.  The patient agrees to proceed with angio/intervention.      2. Hypertension, unspecified type Continue antihypertensive medications as already ordered, these medications have been reviewed and there are no changes at this time.   3. End stage renal disease ([place]) Patient has not had dialysis since Wednesday or Friday (pt unsure), due to the swelling in her arm she may require a permcath while her hematomas resolve.    Current Outpatient Medications on File Prior to Visit  Medication Sig Dispense Refill  . allopurinol (ZYLOPRIM) 100 MG tablet Take 1 tablet (100 mg total) by mouth daily. 30 tablet 3  . amLODipine (NORVASC) 10 MG tablet TAKE 1 TABLET BY MOUTH ONCE DAILY 30 tablet 8  . b  complex-vitamin c-folic acid (NEPHRO-VITE) 0.8 MG TABS tablet TAKE 1 TABLET BY MOUTH EVERY DAY (ON DIALYSIS DAYS, TAKE AFTER DIALYSIS TREATMENT)  4  . carvedilol (COREG) 25 MG tablet Take 25 mg by mouth 2 (two) times daily with a meal.    . cholecalciferol (VITAMIN D) 1000 units tablet Take 1,000 Units by mouth daily.     . ferrous sulfate 325 (65 FE) MG tablet Take 325 mg by mouth daily.     Marland Kitchen lidocaine-prilocaine (EMLA) cream APPLY SMALL AMOUNT TO ACCESS SITE (AVF) 3 TIMES A WEEK 1 HOUR BEFORE DIALYSIS. COVER WITH OCCLUSIVE DRESSING (SARAN WRAP)  6  . losartan (COZAAR) 50 MG tablet Take 50 mg by mouth daily.    . sevelamer carbonate (RENVELA) 800 MG tablet Take by mouth.    . torsemide (DEMADEX) 5 MG tablet Take 5 mg by mouth daily.   11   No current facility-administered medications on file prior to visit.     There are no Patient Instructions on file for this visit. No follow-ups on file.   Kris Hartmann, NP  This note was completed with Sales executive.  Any errors are purely unintentional.

## 2018-12-25 NOTE — Op Note (Signed)
OPERATIVE NOTE   PROCEDURE: 1. Contrast injection left arm AV access 2. Percutaneous transluminal angioplasty and stent placement venous outflow left arm AV graft  PRE-OPERATIVE DIAGNOSIS: Complication of dialysis access                                                       End Stage Renal Disease  POST-OPERATIVE DIAGNOSIS: same as above   SURGEON: Katha Cabal, M.D.  ANESTHESIA: Conscious sedation was administered under my direct supervision by the interventional radiology RN. IV Versed plus fentanyl were utilized. Continuous ECG, pulse oximetry and blood pressure was monitored throughout the entire procedure.  Conscious sedation was for a total of 26 minutes.  ESTIMATED BLOOD LOSS: minimal  FINDING(S): 1. 80% stenosis at the venous anastomosis; large hematoma medial and posterior to the graft  SPECIMEN(S):  None  CONTRAST: 25 cc  FLUOROSCOPY TIME: 1.4 minutes  INDICATIONS: Kristen Francis is a 73 y.o. female who  presents with malfunctioning left arm AV access.  The patient is scheduled for angiography with possible intervention of the AV access.  The patient is aware the risks include but are not limited to: bleeding, infection, thrombosis of the cannulated access, and possible anaphylactic reaction to the contrast.  The patient acknowledges if the access can not be salvaged a tunneled catheter will be needed and will be placed during this procedure.  The patient is aware of the risks of the procedure and elects to proceed with the angiogram and intervention.  DESCRIPTION: After full informed written consent was obtained, the patient was brought back to the Special Procedure suite and placed supine position.  Appropriate cardiopulmonary monitors were placed.  The left arm was prepped and draped in the standard fashion.  Appropriate timeout is called. The left AV graft was cannulated with a micropuncture needle.  Cannulation was performed with ultrasound guidance.  Ultrasound was placed in a sterile sleeve, the AV access was interrogated and noted to be echolucent and compressible indicating patency. Image was recorded for the permanent record. The puncture is performed under continuous ultrasound visualization.   The microwire was advanced and the needle was exchanged for  a microsheath.  The J-wire was then advanced and a 6 Fr sheath inserted.  Hand injections were completed to image the access from the arterial anastomosis through the entire access.  The central venous structures were also imaged by hand injections.  Based on the images, 2000 units of heparin was given and a wire was negotiated through the strictures within the venous portion of the graft.  An 8 mm x 60 mm flared Covera stent was deployed across the stenoses and postdilated with an 8 mm [place] balloon.  Follow-up imaging demonstrates complete resolution of the stricture with rapid flow of contrast through the graft, the central venous anatomy is preserved.  A 4-0 Monocryl purse-string suture was sewn around the sheath.  The sheath was removed and light pressure was applied.  A sterile bandage was applied to the puncture site.  Interpretation: Left AV graft is patent.  Arterial anastomosis is patent.  Central veins are patent.  At the venous anastomosis there is a greater than 80% narrowing.  Incidental notation of large hematoma located medial and posterior to the graft is seen on ultrasound.  No extravasation was identified.  Following angioplasty and stent  placement there is now less than 5% residual stenosis with rapid flow of contrast through the graft and it improved thrill by palpation  COMPLICATIONS: None  CONDITION: Kristen Francis, M.D [facility] Office: [phone]  12/25/2018 5:06 PM

## 2018-12-27 ENCOUNTER — Other Ambulatory Visit
Admission: RE | Admit: 2018-12-27 | Discharge: 2018-12-27 | Disposition: A | Discharge status: Home / Self Care | Payer: Medicare Other | Source: Ambulatory Visit | Attending: Nephrology | Admitting: Nephrology

## 2018-12-27 DIAGNOSIS — N2581 Secondary hyperparathyroidism of renal origin: Secondary | ICD-10-CM | POA: Diagnosis not present

## 2018-12-27 DIAGNOSIS — N186 End stage renal disease: Secondary | ICD-10-CM | POA: Diagnosis not present

## 2018-12-27 DIAGNOSIS — E875 Hyperkalemia: Secondary | ICD-10-CM | POA: Insufficient documentation

## 2018-12-27 DIAGNOSIS — E1129 Type 2 diabetes mellitus with other diabetic kidney complication: Secondary | ICD-10-CM | POA: Diagnosis not present

## 2018-12-27 DIAGNOSIS — D509 Iron deficiency anemia, unspecified: Secondary | ICD-10-CM | POA: Diagnosis not present

## 2018-12-27 LAB — POTASSIUM: Potassium: 5.1 mmol/L (ref 3.5–5.1)

## 2018-12-30 ENCOUNTER — Encounter: Payer: Self-pay | Admitting: Vascular Surgery

## 2018-12-30 DIAGNOSIS — D509 Iron deficiency anemia, unspecified: Secondary | ICD-10-CM | POA: Diagnosis not present

## 2018-12-30 DIAGNOSIS — N186 End stage renal disease: Secondary | ICD-10-CM | POA: Diagnosis not present

## 2018-12-30 DIAGNOSIS — E1129 Type 2 diabetes mellitus with other diabetic kidney complication: Secondary | ICD-10-CM | POA: Diagnosis not present

## 2018-12-30 DIAGNOSIS — N2581 Secondary hyperparathyroidism of renal origin: Secondary | ICD-10-CM | POA: Diagnosis not present

## 2019-01-01 DIAGNOSIS — N186 End stage renal disease: Secondary | ICD-10-CM | POA: Diagnosis not present

## 2019-01-01 DIAGNOSIS — D509 Iron deficiency anemia, unspecified: Secondary | ICD-10-CM | POA: Diagnosis not present

## 2019-01-01 DIAGNOSIS — N2581 Secondary hyperparathyroidism of renal origin: Secondary | ICD-10-CM | POA: Diagnosis not present

## 2019-01-01 DIAGNOSIS — E1129 Type 2 diabetes mellitus with other diabetic kidney complication: Secondary | ICD-10-CM | POA: Diagnosis not present

## 2019-01-03 DIAGNOSIS — E1129 Type 2 diabetes mellitus with other diabetic kidney complication: Secondary | ICD-10-CM | POA: Diagnosis not present

## 2019-01-03 DIAGNOSIS — N040 Nephrotic syndrome with minor glomerular abnormality: Secondary | ICD-10-CM | POA: Diagnosis not present

## 2019-01-03 DIAGNOSIS — N186 End stage renal disease: Secondary | ICD-10-CM | POA: Diagnosis not present

## 2019-01-03 DIAGNOSIS — D509 Iron deficiency anemia, unspecified: Secondary | ICD-10-CM | POA: Diagnosis not present

## 2019-01-03 DIAGNOSIS — Z992 Dependence on renal dialysis: Secondary | ICD-10-CM | POA: Diagnosis not present

## 2019-01-03 DIAGNOSIS — N2581 Secondary hyperparathyroidism of renal origin: Secondary | ICD-10-CM | POA: Diagnosis not present

## 2019-01-06 DIAGNOSIS — N2581 Secondary hyperparathyroidism of renal origin: Secondary | ICD-10-CM | POA: Diagnosis not present

## 2019-01-06 DIAGNOSIS — Z23 Encounter for immunization: Secondary | ICD-10-CM | POA: Diagnosis not present

## 2019-01-06 DIAGNOSIS — N186 End stage renal disease: Secondary | ICD-10-CM | POA: Diagnosis not present

## 2019-01-06 DIAGNOSIS — D631 Anemia in chronic kidney disease: Secondary | ICD-10-CM | POA: Diagnosis not present

## 2019-01-06 DIAGNOSIS — D509 Iron deficiency anemia, unspecified: Secondary | ICD-10-CM | POA: Diagnosis not present

## 2019-01-06 DIAGNOSIS — E1129 Type 2 diabetes mellitus with other diabetic kidney complication: Secondary | ICD-10-CM | POA: Diagnosis not present

## 2019-01-08 DIAGNOSIS — E1129 Type 2 diabetes mellitus with other diabetic kidney complication: Secondary | ICD-10-CM | POA: Diagnosis not present

## 2019-01-08 DIAGNOSIS — N186 End stage renal disease: Secondary | ICD-10-CM | POA: Diagnosis not present

## 2019-01-08 DIAGNOSIS — D631 Anemia in chronic kidney disease: Secondary | ICD-10-CM | POA: Diagnosis not present

## 2019-01-08 DIAGNOSIS — Z23 Encounter for immunization: Secondary | ICD-10-CM | POA: Diagnosis not present

## 2019-01-08 DIAGNOSIS — D509 Iron deficiency anemia, unspecified: Secondary | ICD-10-CM | POA: Diagnosis not present

## 2019-01-08 DIAGNOSIS — N2581 Secondary hyperparathyroidism of renal origin: Secondary | ICD-10-CM | POA: Diagnosis not present

## 2019-01-10 DIAGNOSIS — N186 End stage renal disease: Secondary | ICD-10-CM | POA: Diagnosis not present

## 2019-01-10 DIAGNOSIS — D631 Anemia in chronic kidney disease: Secondary | ICD-10-CM | POA: Diagnosis not present

## 2019-01-10 DIAGNOSIS — E1129 Type 2 diabetes mellitus with other diabetic kidney complication: Secondary | ICD-10-CM | POA: Diagnosis not present

## 2019-01-10 DIAGNOSIS — N2581 Secondary hyperparathyroidism of renal origin: Secondary | ICD-10-CM | POA: Diagnosis not present

## 2019-01-10 DIAGNOSIS — Z23 Encounter for immunization: Secondary | ICD-10-CM | POA: Diagnosis not present

## 2019-01-10 DIAGNOSIS — D509 Iron deficiency anemia, unspecified: Secondary | ICD-10-CM | POA: Diagnosis not present

## 2019-01-13 DIAGNOSIS — N186 End stage renal disease: Secondary | ICD-10-CM | POA: Diagnosis not present

## 2019-01-13 DIAGNOSIS — N2581 Secondary hyperparathyroidism of renal origin: Secondary | ICD-10-CM | POA: Diagnosis not present

## 2019-01-13 DIAGNOSIS — E1129 Type 2 diabetes mellitus with other diabetic kidney complication: Secondary | ICD-10-CM | POA: Diagnosis not present

## 2019-01-13 DIAGNOSIS — D631 Anemia in chronic kidney disease: Secondary | ICD-10-CM | POA: Diagnosis not present

## 2019-01-13 DIAGNOSIS — D509 Iron deficiency anemia, unspecified: Secondary | ICD-10-CM | POA: Diagnosis not present

## 2019-01-13 DIAGNOSIS — Z23 Encounter for immunization: Secondary | ICD-10-CM | POA: Diagnosis not present

## 2019-01-15 DIAGNOSIS — E1129 Type 2 diabetes mellitus with other diabetic kidney complication: Secondary | ICD-10-CM | POA: Diagnosis not present

## 2019-01-15 DIAGNOSIS — Z23 Encounter for immunization: Secondary | ICD-10-CM | POA: Diagnosis not present

## 2019-01-15 DIAGNOSIS — D509 Iron deficiency anemia, unspecified: Secondary | ICD-10-CM | POA: Diagnosis not present

## 2019-01-15 DIAGNOSIS — D631 Anemia in chronic kidney disease: Secondary | ICD-10-CM | POA: Diagnosis not present

## 2019-01-15 DIAGNOSIS — N186 End stage renal disease: Secondary | ICD-10-CM | POA: Diagnosis not present

## 2019-01-15 DIAGNOSIS — N2581 Secondary hyperparathyroidism of renal origin: Secondary | ICD-10-CM | POA: Diagnosis not present

## 2019-01-17 DIAGNOSIS — N186 End stage renal disease: Secondary | ICD-10-CM | POA: Diagnosis not present

## 2019-01-17 DIAGNOSIS — D509 Iron deficiency anemia, unspecified: Secondary | ICD-10-CM | POA: Diagnosis not present

## 2019-01-17 DIAGNOSIS — D631 Anemia in chronic kidney disease: Secondary | ICD-10-CM | POA: Diagnosis not present

## 2019-01-17 DIAGNOSIS — N2581 Secondary hyperparathyroidism of renal origin: Secondary | ICD-10-CM | POA: Diagnosis not present

## 2019-01-17 DIAGNOSIS — E1129 Type 2 diabetes mellitus with other diabetic kidney complication: Secondary | ICD-10-CM | POA: Diagnosis not present

## 2019-01-17 DIAGNOSIS — Z23 Encounter for immunization: Secondary | ICD-10-CM | POA: Diagnosis not present

## 2019-01-20 DIAGNOSIS — D631 Anemia in chronic kidney disease: Secondary | ICD-10-CM | POA: Diagnosis not present

## 2019-01-20 DIAGNOSIS — E1129 Type 2 diabetes mellitus with other diabetic kidney complication: Secondary | ICD-10-CM | POA: Diagnosis not present

## 2019-01-20 DIAGNOSIS — Z23 Encounter for immunization: Secondary | ICD-10-CM | POA: Diagnosis not present

## 2019-01-20 DIAGNOSIS — N2581 Secondary hyperparathyroidism of renal origin: Secondary | ICD-10-CM | POA: Diagnosis not present

## 2019-01-20 DIAGNOSIS — N186 End stage renal disease: Secondary | ICD-10-CM | POA: Diagnosis not present

## 2019-01-20 DIAGNOSIS — D509 Iron deficiency anemia, unspecified: Secondary | ICD-10-CM | POA: Diagnosis not present

## 2019-01-22 DIAGNOSIS — Z23 Encounter for immunization: Secondary | ICD-10-CM | POA: Diagnosis not present

## 2019-01-22 DIAGNOSIS — D631 Anemia in chronic kidney disease: Secondary | ICD-10-CM | POA: Diagnosis not present

## 2019-01-22 DIAGNOSIS — D509 Iron deficiency anemia, unspecified: Secondary | ICD-10-CM | POA: Diagnosis not present

## 2019-01-22 DIAGNOSIS — E1129 Type 2 diabetes mellitus with other diabetic kidney complication: Secondary | ICD-10-CM | POA: Diagnosis not present

## 2019-01-22 DIAGNOSIS — N186 End stage renal disease: Secondary | ICD-10-CM | POA: Diagnosis not present

## 2019-01-22 DIAGNOSIS — N2581 Secondary hyperparathyroidism of renal origin: Secondary | ICD-10-CM | POA: Diagnosis not present

## 2019-01-24 DIAGNOSIS — D509 Iron deficiency anemia, unspecified: Secondary | ICD-10-CM | POA: Diagnosis not present

## 2019-01-24 DIAGNOSIS — Z23 Encounter for immunization: Secondary | ICD-10-CM | POA: Diagnosis not present

## 2019-01-24 DIAGNOSIS — N2581 Secondary hyperparathyroidism of renal origin: Secondary | ICD-10-CM | POA: Diagnosis not present

## 2019-01-24 DIAGNOSIS — N186 End stage renal disease: Secondary | ICD-10-CM | POA: Diagnosis not present

## 2019-01-24 DIAGNOSIS — D631 Anemia in chronic kidney disease: Secondary | ICD-10-CM | POA: Diagnosis not present

## 2019-01-24 DIAGNOSIS — E1129 Type 2 diabetes mellitus with other diabetic kidney complication: Secondary | ICD-10-CM | POA: Diagnosis not present

## 2019-01-27 DIAGNOSIS — D509 Iron deficiency anemia, unspecified: Secondary | ICD-10-CM | POA: Diagnosis not present

## 2019-01-27 DIAGNOSIS — E1129 Type 2 diabetes mellitus with other diabetic kidney complication: Secondary | ICD-10-CM | POA: Diagnosis not present

## 2019-01-27 DIAGNOSIS — N2581 Secondary hyperparathyroidism of renal origin: Secondary | ICD-10-CM | POA: Diagnosis not present

## 2019-01-27 DIAGNOSIS — Z23 Encounter for immunization: Secondary | ICD-10-CM | POA: Diagnosis not present

## 2019-01-27 DIAGNOSIS — N186 End stage renal disease: Secondary | ICD-10-CM | POA: Diagnosis not present

## 2019-01-27 DIAGNOSIS — D631 Anemia in chronic kidney disease: Secondary | ICD-10-CM | POA: Diagnosis not present

## 2019-01-29 DIAGNOSIS — N2581 Secondary hyperparathyroidism of renal origin: Secondary | ICD-10-CM | POA: Diagnosis not present

## 2019-01-29 DIAGNOSIS — D509 Iron deficiency anemia, unspecified: Secondary | ICD-10-CM | POA: Diagnosis not present

## 2019-01-29 DIAGNOSIS — D631 Anemia in chronic kidney disease: Secondary | ICD-10-CM | POA: Diagnosis not present

## 2019-01-29 DIAGNOSIS — N186 End stage renal disease: Secondary | ICD-10-CM | POA: Diagnosis not present

## 2019-01-29 DIAGNOSIS — E1129 Type 2 diabetes mellitus with other diabetic kidney complication: Secondary | ICD-10-CM | POA: Diagnosis not present

## 2019-01-29 DIAGNOSIS — Z23 Encounter for immunization: Secondary | ICD-10-CM | POA: Diagnosis not present

## 2019-01-30 ENCOUNTER — Ambulatory Visit (INDEPENDENT_AMBULATORY_CARE_PROVIDER_SITE_OTHER): Payer: Medicare Other | Admitting: Vascular Surgery

## 2019-01-30 ENCOUNTER — Encounter (INDEPENDENT_AMBULATORY_CARE_PROVIDER_SITE_OTHER): Payer: Medicare Other

## 2019-01-31 DIAGNOSIS — E1129 Type 2 diabetes mellitus with other diabetic kidney complication: Secondary | ICD-10-CM | POA: Diagnosis not present

## 2019-01-31 DIAGNOSIS — N186 End stage renal disease: Secondary | ICD-10-CM | POA: Diagnosis not present

## 2019-01-31 DIAGNOSIS — D631 Anemia in chronic kidney disease: Secondary | ICD-10-CM | POA: Diagnosis not present

## 2019-01-31 DIAGNOSIS — D509 Iron deficiency anemia, unspecified: Secondary | ICD-10-CM | POA: Diagnosis not present

## 2019-01-31 DIAGNOSIS — Z23 Encounter for immunization: Secondary | ICD-10-CM | POA: Diagnosis not present

## 2019-01-31 DIAGNOSIS — N2581 Secondary hyperparathyroidism of renal origin: Secondary | ICD-10-CM | POA: Diagnosis not present

## 2019-02-01 DIAGNOSIS — N186 End stage renal disease: Secondary | ICD-10-CM | POA: Diagnosis not present

## 2019-02-01 DIAGNOSIS — N040 Nephrotic syndrome with minor glomerular abnormality: Secondary | ICD-10-CM | POA: Diagnosis not present

## 2019-02-01 DIAGNOSIS — Z992 Dependence on renal dialysis: Secondary | ICD-10-CM | POA: Diagnosis not present

## 2019-02-03 DIAGNOSIS — N186 End stage renal disease: Secondary | ICD-10-CM | POA: Diagnosis not present

## 2019-02-03 DIAGNOSIS — Z23 Encounter for immunization: Secondary | ICD-10-CM | POA: Diagnosis not present

## 2019-02-03 DIAGNOSIS — E1129 Type 2 diabetes mellitus with other diabetic kidney complication: Secondary | ICD-10-CM | POA: Diagnosis not present

## 2019-02-03 DIAGNOSIS — D631 Anemia in chronic kidney disease: Secondary | ICD-10-CM | POA: Diagnosis not present

## 2019-02-03 DIAGNOSIS — N2581 Secondary hyperparathyroidism of renal origin: Secondary | ICD-10-CM | POA: Diagnosis not present

## 2019-02-03 DIAGNOSIS — D509 Iron deficiency anemia, unspecified: Secondary | ICD-10-CM | POA: Diagnosis not present

## 2019-02-05 DIAGNOSIS — N186 End stage renal disease: Secondary | ICD-10-CM | POA: Diagnosis not present

## 2019-02-05 DIAGNOSIS — N2581 Secondary hyperparathyroidism of renal origin: Secondary | ICD-10-CM | POA: Diagnosis not present

## 2019-02-05 DIAGNOSIS — D631 Anemia in chronic kidney disease: Secondary | ICD-10-CM | POA: Diagnosis not present

## 2019-02-05 DIAGNOSIS — Z23 Encounter for immunization: Secondary | ICD-10-CM | POA: Diagnosis not present

## 2019-02-05 DIAGNOSIS — D509 Iron deficiency anemia, unspecified: Secondary | ICD-10-CM | POA: Diagnosis not present

## 2019-02-05 DIAGNOSIS — E1129 Type 2 diabetes mellitus with other diabetic kidney complication: Secondary | ICD-10-CM | POA: Diagnosis not present

## 2019-02-07 ENCOUNTER — Telehealth: Payer: Self-pay

## 2019-02-07 ENCOUNTER — Other Ambulatory Visit: Payer: Self-pay

## 2019-02-07 DIAGNOSIS — N2581 Secondary hyperparathyroidism of renal origin: Secondary | ICD-10-CM | POA: Diagnosis not present

## 2019-02-07 DIAGNOSIS — E1129 Type 2 diabetes mellitus with other diabetic kidney complication: Secondary | ICD-10-CM | POA: Diagnosis not present

## 2019-02-07 DIAGNOSIS — N186 End stage renal disease: Secondary | ICD-10-CM | POA: Diagnosis not present

## 2019-02-07 DIAGNOSIS — D509 Iron deficiency anemia, unspecified: Secondary | ICD-10-CM | POA: Diagnosis not present

## 2019-02-07 DIAGNOSIS — D631 Anemia in chronic kidney disease: Secondary | ICD-10-CM | POA: Diagnosis not present

## 2019-02-07 DIAGNOSIS — Z23 Encounter for immunization: Secondary | ICD-10-CM | POA: Diagnosis not present

## 2019-02-07 MED ORDER — ALLOPURINOL 100 MG PO TABS: 100.0000 mg | ORAL_TABLET | Freq: Every day | ORAL | 1 refills | Status: DC

## 2019-02-07 NOTE — Telephone Encounter (Signed)
lmom pt need appt follow up for med refills we send for 2 months

## 2019-02-10 DIAGNOSIS — N186 End stage renal disease: Secondary | ICD-10-CM | POA: Diagnosis not present

## 2019-02-10 DIAGNOSIS — E1129 Type 2 diabetes mellitus with other diabetic kidney complication: Secondary | ICD-10-CM | POA: Diagnosis not present

## 2019-02-10 DIAGNOSIS — Z23 Encounter for immunization: Secondary | ICD-10-CM | POA: Diagnosis not present

## 2019-02-10 DIAGNOSIS — D631 Anemia in chronic kidney disease: Secondary | ICD-10-CM | POA: Diagnosis not present

## 2019-02-10 DIAGNOSIS — D509 Iron deficiency anemia, unspecified: Secondary | ICD-10-CM | POA: Diagnosis not present

## 2019-02-10 DIAGNOSIS — N2581 Secondary hyperparathyroidism of renal origin: Secondary | ICD-10-CM | POA: Diagnosis not present

## 2019-02-12 DIAGNOSIS — N186 End stage renal disease: Secondary | ICD-10-CM | POA: Diagnosis not present

## 2019-02-12 DIAGNOSIS — E1129 Type 2 diabetes mellitus with other diabetic kidney complication: Secondary | ICD-10-CM | POA: Diagnosis not present

## 2019-02-12 DIAGNOSIS — D631 Anemia in chronic kidney disease: Secondary | ICD-10-CM | POA: Diagnosis not present

## 2019-02-12 DIAGNOSIS — N2581 Secondary hyperparathyroidism of renal origin: Secondary | ICD-10-CM | POA: Diagnosis not present

## 2019-02-12 DIAGNOSIS — D509 Iron deficiency anemia, unspecified: Secondary | ICD-10-CM | POA: Diagnosis not present

## 2019-02-12 DIAGNOSIS — Z23 Encounter for immunization: Secondary | ICD-10-CM | POA: Diagnosis not present

## 2019-02-14 DIAGNOSIS — N2581 Secondary hyperparathyroidism of renal origin: Secondary | ICD-10-CM | POA: Diagnosis not present

## 2019-02-14 DIAGNOSIS — N186 End stage renal disease: Secondary | ICD-10-CM | POA: Diagnosis not present

## 2019-02-14 DIAGNOSIS — E1129 Type 2 diabetes mellitus with other diabetic kidney complication: Secondary | ICD-10-CM | POA: Diagnosis not present

## 2019-02-14 DIAGNOSIS — D509 Iron deficiency anemia, unspecified: Secondary | ICD-10-CM | POA: Diagnosis not present

## 2019-02-14 DIAGNOSIS — D631 Anemia in chronic kidney disease: Secondary | ICD-10-CM | POA: Diagnosis not present

## 2019-02-14 DIAGNOSIS — Z23 Encounter for immunization: Secondary | ICD-10-CM | POA: Diagnosis not present

## 2019-02-17 DIAGNOSIS — D509 Iron deficiency anemia, unspecified: Secondary | ICD-10-CM | POA: Diagnosis not present

## 2019-02-17 DIAGNOSIS — D631 Anemia in chronic kidney disease: Secondary | ICD-10-CM | POA: Diagnosis not present

## 2019-02-17 DIAGNOSIS — E1129 Type 2 diabetes mellitus with other diabetic kidney complication: Secondary | ICD-10-CM | POA: Diagnosis not present

## 2019-02-17 DIAGNOSIS — N2581 Secondary hyperparathyroidism of renal origin: Secondary | ICD-10-CM | POA: Diagnosis not present

## 2019-02-17 DIAGNOSIS — N186 End stage renal disease: Secondary | ICD-10-CM | POA: Diagnosis not present

## 2019-02-17 DIAGNOSIS — Z23 Encounter for immunization: Secondary | ICD-10-CM | POA: Diagnosis not present

## 2019-02-19 DIAGNOSIS — D509 Iron deficiency anemia, unspecified: Secondary | ICD-10-CM | POA: Diagnosis not present

## 2019-02-19 DIAGNOSIS — D631 Anemia in chronic kidney disease: Secondary | ICD-10-CM | POA: Diagnosis not present

## 2019-02-19 DIAGNOSIS — E1129 Type 2 diabetes mellitus with other diabetic kidney complication: Secondary | ICD-10-CM | POA: Diagnosis not present

## 2019-02-19 DIAGNOSIS — Z23 Encounter for immunization: Secondary | ICD-10-CM | POA: Diagnosis not present

## 2019-02-19 DIAGNOSIS — N186 End stage renal disease: Secondary | ICD-10-CM | POA: Diagnosis not present

## 2019-02-19 DIAGNOSIS — N2581 Secondary hyperparathyroidism of renal origin: Secondary | ICD-10-CM | POA: Diagnosis not present

## 2019-02-21 DIAGNOSIS — Z23 Encounter for immunization: Secondary | ICD-10-CM | POA: Diagnosis not present

## 2019-02-21 DIAGNOSIS — D631 Anemia in chronic kidney disease: Secondary | ICD-10-CM | POA: Diagnosis not present

## 2019-02-21 DIAGNOSIS — N2581 Secondary hyperparathyroidism of renal origin: Secondary | ICD-10-CM | POA: Diagnosis not present

## 2019-02-21 DIAGNOSIS — N186 End stage renal disease: Secondary | ICD-10-CM | POA: Diagnosis not present

## 2019-02-21 DIAGNOSIS — E1129 Type 2 diabetes mellitus with other diabetic kidney complication: Secondary | ICD-10-CM | POA: Diagnosis not present

## 2019-02-21 DIAGNOSIS — D509 Iron deficiency anemia, unspecified: Secondary | ICD-10-CM | POA: Diagnosis not present

## 2019-02-24 DIAGNOSIS — D631 Anemia in chronic kidney disease: Secondary | ICD-10-CM | POA: Diagnosis not present

## 2019-02-24 DIAGNOSIS — N2581 Secondary hyperparathyroidism of renal origin: Secondary | ICD-10-CM | POA: Diagnosis not present

## 2019-02-24 DIAGNOSIS — E1129 Type 2 diabetes mellitus with other diabetic kidney complication: Secondary | ICD-10-CM | POA: Diagnosis not present

## 2019-02-24 DIAGNOSIS — Z23 Encounter for immunization: Secondary | ICD-10-CM | POA: Diagnosis not present

## 2019-02-24 DIAGNOSIS — D509 Iron deficiency anemia, unspecified: Secondary | ICD-10-CM | POA: Diagnosis not present

## 2019-02-24 DIAGNOSIS — N186 End stage renal disease: Secondary | ICD-10-CM | POA: Diagnosis not present

## 2019-02-26 DIAGNOSIS — Z23 Encounter for immunization: Secondary | ICD-10-CM | POA: Diagnosis not present

## 2019-02-26 DIAGNOSIS — N186 End stage renal disease: Secondary | ICD-10-CM | POA: Diagnosis not present

## 2019-02-26 DIAGNOSIS — D509 Iron deficiency anemia, unspecified: Secondary | ICD-10-CM | POA: Diagnosis not present

## 2019-02-26 DIAGNOSIS — D631 Anemia in chronic kidney disease: Secondary | ICD-10-CM | POA: Diagnosis not present

## 2019-02-26 DIAGNOSIS — E1129 Type 2 diabetes mellitus with other diabetic kidney complication: Secondary | ICD-10-CM | POA: Diagnosis not present

## 2019-02-26 DIAGNOSIS — N2581 Secondary hyperparathyroidism of renal origin: Secondary | ICD-10-CM | POA: Diagnosis not present

## 2019-02-28 DIAGNOSIS — N2581 Secondary hyperparathyroidism of renal origin: Secondary | ICD-10-CM | POA: Diagnosis not present

## 2019-02-28 DIAGNOSIS — N186 End stage renal disease: Secondary | ICD-10-CM | POA: Diagnosis not present

## 2019-02-28 DIAGNOSIS — Z23 Encounter for immunization: Secondary | ICD-10-CM | POA: Diagnosis not present

## 2019-02-28 DIAGNOSIS — D631 Anemia in chronic kidney disease: Secondary | ICD-10-CM | POA: Diagnosis not present

## 2019-02-28 DIAGNOSIS — D509 Iron deficiency anemia, unspecified: Secondary | ICD-10-CM | POA: Diagnosis not present

## 2019-02-28 DIAGNOSIS — E1129 Type 2 diabetes mellitus with other diabetic kidney complication: Secondary | ICD-10-CM | POA: Diagnosis not present

## 2019-03-03 DIAGNOSIS — E1129 Type 2 diabetes mellitus with other diabetic kidney complication: Secondary | ICD-10-CM | POA: Diagnosis not present

## 2019-03-03 DIAGNOSIS — N186 End stage renal disease: Secondary | ICD-10-CM | POA: Diagnosis not present

## 2019-03-03 DIAGNOSIS — D631 Anemia in chronic kidney disease: Secondary | ICD-10-CM | POA: Diagnosis not present

## 2019-03-03 DIAGNOSIS — Z23 Encounter for immunization: Secondary | ICD-10-CM | POA: Diagnosis not present

## 2019-03-03 DIAGNOSIS — D509 Iron deficiency anemia, unspecified: Secondary | ICD-10-CM | POA: Diagnosis not present

## 2019-03-03 DIAGNOSIS — N2581 Secondary hyperparathyroidism of renal origin: Secondary | ICD-10-CM | POA: Diagnosis not present

## 2019-03-04 DIAGNOSIS — N040 Nephrotic syndrome with minor glomerular abnormality: Secondary | ICD-10-CM | POA: Diagnosis not present

## 2019-03-04 DIAGNOSIS — Z992 Dependence on renal dialysis: Secondary | ICD-10-CM | POA: Diagnosis not present

## 2019-03-04 DIAGNOSIS — N186 End stage renal disease: Secondary | ICD-10-CM | POA: Diagnosis not present

## 2019-03-05 DIAGNOSIS — D631 Anemia in chronic kidney disease: Secondary | ICD-10-CM | POA: Diagnosis not present

## 2019-03-05 DIAGNOSIS — D509 Iron deficiency anemia, unspecified: Secondary | ICD-10-CM | POA: Diagnosis not present

## 2019-03-05 DIAGNOSIS — Z23 Encounter for immunization: Secondary | ICD-10-CM | POA: Diagnosis not present

## 2019-03-05 DIAGNOSIS — N186 End stage renal disease: Secondary | ICD-10-CM | POA: Diagnosis not present

## 2019-03-05 DIAGNOSIS — N2581 Secondary hyperparathyroidism of renal origin: Secondary | ICD-10-CM | POA: Diagnosis not present

## 2019-03-05 DIAGNOSIS — E1129 Type 2 diabetes mellitus with other diabetic kidney complication: Secondary | ICD-10-CM | POA: Diagnosis not present

## 2019-03-07 DIAGNOSIS — N2581 Secondary hyperparathyroidism of renal origin: Secondary | ICD-10-CM | POA: Diagnosis not present

## 2019-03-07 DIAGNOSIS — N186 End stage renal disease: Secondary | ICD-10-CM | POA: Diagnosis not present

## 2019-03-07 DIAGNOSIS — D631 Anemia in chronic kidney disease: Secondary | ICD-10-CM | POA: Diagnosis not present

## 2019-03-07 DIAGNOSIS — Z23 Encounter for immunization: Secondary | ICD-10-CM | POA: Diagnosis not present

## 2019-03-07 DIAGNOSIS — D509 Iron deficiency anemia, unspecified: Secondary | ICD-10-CM | POA: Diagnosis not present

## 2019-03-07 DIAGNOSIS — E1129 Type 2 diabetes mellitus with other diabetic kidney complication: Secondary | ICD-10-CM | POA: Diagnosis not present

## 2019-03-10 DIAGNOSIS — Z23 Encounter for immunization: Secondary | ICD-10-CM | POA: Diagnosis not present

## 2019-03-10 DIAGNOSIS — E1129 Type 2 diabetes mellitus with other diabetic kidney complication: Secondary | ICD-10-CM | POA: Diagnosis not present

## 2019-03-10 DIAGNOSIS — D631 Anemia in chronic kidney disease: Secondary | ICD-10-CM | POA: Diagnosis not present

## 2019-03-10 DIAGNOSIS — N2581 Secondary hyperparathyroidism of renal origin: Secondary | ICD-10-CM | POA: Diagnosis not present

## 2019-03-10 DIAGNOSIS — N186 End stage renal disease: Secondary | ICD-10-CM | POA: Diagnosis not present

## 2019-03-10 DIAGNOSIS — D509 Iron deficiency anemia, unspecified: Secondary | ICD-10-CM | POA: Diagnosis not present

## 2019-03-12 DIAGNOSIS — N2581 Secondary hyperparathyroidism of renal origin: Secondary | ICD-10-CM | POA: Diagnosis not present

## 2019-03-12 DIAGNOSIS — E1129 Type 2 diabetes mellitus with other diabetic kidney complication: Secondary | ICD-10-CM | POA: Diagnosis not present

## 2019-03-12 DIAGNOSIS — N186 End stage renal disease: Secondary | ICD-10-CM | POA: Diagnosis not present

## 2019-03-12 DIAGNOSIS — Z23 Encounter for immunization: Secondary | ICD-10-CM | POA: Diagnosis not present

## 2019-03-12 DIAGNOSIS — D631 Anemia in chronic kidney disease: Secondary | ICD-10-CM | POA: Diagnosis not present

## 2019-03-12 DIAGNOSIS — D509 Iron deficiency anemia, unspecified: Secondary | ICD-10-CM | POA: Diagnosis not present

## 2019-03-14 DIAGNOSIS — Z23 Encounter for immunization: Secondary | ICD-10-CM | POA: Diagnosis not present

## 2019-03-14 DIAGNOSIS — E1129 Type 2 diabetes mellitus with other diabetic kidney complication: Secondary | ICD-10-CM | POA: Diagnosis not present

## 2019-03-14 DIAGNOSIS — D631 Anemia in chronic kidney disease: Secondary | ICD-10-CM | POA: Diagnosis not present

## 2019-03-14 DIAGNOSIS — N186 End stage renal disease: Secondary | ICD-10-CM | POA: Diagnosis not present

## 2019-03-14 DIAGNOSIS — D509 Iron deficiency anemia, unspecified: Secondary | ICD-10-CM | POA: Diagnosis not present

## 2019-03-14 DIAGNOSIS — N2581 Secondary hyperparathyroidism of renal origin: Secondary | ICD-10-CM | POA: Diagnosis not present

## 2019-03-17 DIAGNOSIS — N186 End stage renal disease: Secondary | ICD-10-CM | POA: Diagnosis not present

## 2019-03-17 DIAGNOSIS — N2581 Secondary hyperparathyroidism of renal origin: Secondary | ICD-10-CM | POA: Diagnosis not present

## 2019-03-17 DIAGNOSIS — D631 Anemia in chronic kidney disease: Secondary | ICD-10-CM | POA: Diagnosis not present

## 2019-03-17 DIAGNOSIS — D509 Iron deficiency anemia, unspecified: Secondary | ICD-10-CM | POA: Diagnosis not present

## 2019-03-17 DIAGNOSIS — E1129 Type 2 diabetes mellitus with other diabetic kidney complication: Secondary | ICD-10-CM | POA: Diagnosis not present

## 2019-03-17 DIAGNOSIS — Z23 Encounter for immunization: Secondary | ICD-10-CM | POA: Diagnosis not present

## 2019-03-19 DIAGNOSIS — D509 Iron deficiency anemia, unspecified: Secondary | ICD-10-CM | POA: Diagnosis not present

## 2019-03-19 DIAGNOSIS — N2581 Secondary hyperparathyroidism of renal origin: Secondary | ICD-10-CM | POA: Diagnosis not present

## 2019-03-19 DIAGNOSIS — Z23 Encounter for immunization: Secondary | ICD-10-CM | POA: Diagnosis not present

## 2019-03-19 DIAGNOSIS — D631 Anemia in chronic kidney disease: Secondary | ICD-10-CM | POA: Diagnosis not present

## 2019-03-19 DIAGNOSIS — E1129 Type 2 diabetes mellitus with other diabetic kidney complication: Secondary | ICD-10-CM | POA: Diagnosis not present

## 2019-03-19 DIAGNOSIS — N186 End stage renal disease: Secondary | ICD-10-CM | POA: Diagnosis not present

## 2019-03-21 DIAGNOSIS — D509 Iron deficiency anemia, unspecified: Secondary | ICD-10-CM | POA: Diagnosis not present

## 2019-03-21 DIAGNOSIS — Z23 Encounter for immunization: Secondary | ICD-10-CM | POA: Diagnosis not present

## 2019-03-21 DIAGNOSIS — E1129 Type 2 diabetes mellitus with other diabetic kidney complication: Secondary | ICD-10-CM | POA: Diagnosis not present

## 2019-03-21 DIAGNOSIS — N186 End stage renal disease: Secondary | ICD-10-CM | POA: Diagnosis not present

## 2019-03-21 DIAGNOSIS — N2581 Secondary hyperparathyroidism of renal origin: Secondary | ICD-10-CM | POA: Diagnosis not present

## 2019-03-21 DIAGNOSIS — D631 Anemia in chronic kidney disease: Secondary | ICD-10-CM | POA: Diagnosis not present

## 2019-03-24 DIAGNOSIS — E1129 Type 2 diabetes mellitus with other diabetic kidney complication: Secondary | ICD-10-CM | POA: Diagnosis not present

## 2019-03-24 DIAGNOSIS — Z23 Encounter for immunization: Secondary | ICD-10-CM | POA: Diagnosis not present

## 2019-03-24 DIAGNOSIS — D631 Anemia in chronic kidney disease: Secondary | ICD-10-CM | POA: Diagnosis not present

## 2019-03-24 DIAGNOSIS — D509 Iron deficiency anemia, unspecified: Secondary | ICD-10-CM | POA: Diagnosis not present

## 2019-03-24 DIAGNOSIS — N186 End stage renal disease: Secondary | ICD-10-CM | POA: Diagnosis not present

## 2019-03-24 DIAGNOSIS — N2581 Secondary hyperparathyroidism of renal origin: Secondary | ICD-10-CM | POA: Diagnosis not present

## 2019-03-26 DIAGNOSIS — Z23 Encounter for immunization: Secondary | ICD-10-CM | POA: Diagnosis not present

## 2019-03-26 DIAGNOSIS — N186 End stage renal disease: Secondary | ICD-10-CM | POA: Diagnosis not present

## 2019-03-26 DIAGNOSIS — E1129 Type 2 diabetes mellitus with other diabetic kidney complication: Secondary | ICD-10-CM | POA: Diagnosis not present

## 2019-03-26 DIAGNOSIS — N2581 Secondary hyperparathyroidism of renal origin: Secondary | ICD-10-CM | POA: Diagnosis not present

## 2019-03-26 DIAGNOSIS — D631 Anemia in chronic kidney disease: Secondary | ICD-10-CM | POA: Diagnosis not present

## 2019-03-26 DIAGNOSIS — D509 Iron deficiency anemia, unspecified: Secondary | ICD-10-CM | POA: Diagnosis not present

## 2019-03-28 DIAGNOSIS — Z23 Encounter for immunization: Secondary | ICD-10-CM | POA: Diagnosis not present

## 2019-03-28 DIAGNOSIS — E1129 Type 2 diabetes mellitus with other diabetic kidney complication: Secondary | ICD-10-CM | POA: Diagnosis not present

## 2019-03-28 DIAGNOSIS — N186 End stage renal disease: Secondary | ICD-10-CM | POA: Diagnosis not present

## 2019-03-28 DIAGNOSIS — D509 Iron deficiency anemia, unspecified: Secondary | ICD-10-CM | POA: Diagnosis not present

## 2019-03-28 DIAGNOSIS — D631 Anemia in chronic kidney disease: Secondary | ICD-10-CM | POA: Diagnosis not present

## 2019-03-28 DIAGNOSIS — N2581 Secondary hyperparathyroidism of renal origin: Secondary | ICD-10-CM | POA: Diagnosis not present

## 2019-03-31 DIAGNOSIS — D631 Anemia in chronic kidney disease: Secondary | ICD-10-CM | POA: Diagnosis not present

## 2019-03-31 DIAGNOSIS — Z23 Encounter for immunization: Secondary | ICD-10-CM | POA: Diagnosis not present

## 2019-03-31 DIAGNOSIS — E1129 Type 2 diabetes mellitus with other diabetic kidney complication: Secondary | ICD-10-CM | POA: Diagnosis not present

## 2019-03-31 DIAGNOSIS — D509 Iron deficiency anemia, unspecified: Secondary | ICD-10-CM | POA: Diagnosis not present

## 2019-03-31 DIAGNOSIS — N186 End stage renal disease: Secondary | ICD-10-CM | POA: Diagnosis not present

## 2019-03-31 DIAGNOSIS — N2581 Secondary hyperparathyroidism of renal origin: Secondary | ICD-10-CM | POA: Diagnosis not present

## 2019-04-02 DIAGNOSIS — D631 Anemia in chronic kidney disease: Secondary | ICD-10-CM | POA: Diagnosis not present

## 2019-04-02 DIAGNOSIS — N2581 Secondary hyperparathyroidism of renal origin: Secondary | ICD-10-CM | POA: Diagnosis not present

## 2019-04-02 DIAGNOSIS — N186 End stage renal disease: Secondary | ICD-10-CM | POA: Diagnosis not present

## 2019-04-02 DIAGNOSIS — Z23 Encounter for immunization: Secondary | ICD-10-CM | POA: Diagnosis not present

## 2019-04-02 DIAGNOSIS — D509 Iron deficiency anemia, unspecified: Secondary | ICD-10-CM | POA: Diagnosis not present

## 2019-04-02 DIAGNOSIS — E1129 Type 2 diabetes mellitus with other diabetic kidney complication: Secondary | ICD-10-CM | POA: Diagnosis not present

## 2019-04-03 DIAGNOSIS — Z992 Dependence on renal dialysis: Secondary | ICD-10-CM | POA: Diagnosis not present

## 2019-04-03 DIAGNOSIS — N186 End stage renal disease: Secondary | ICD-10-CM | POA: Diagnosis not present

## 2019-04-03 DIAGNOSIS — N040 Nephrotic syndrome with minor glomerular abnormality: Secondary | ICD-10-CM | POA: Diagnosis not present

## 2019-04-04 DIAGNOSIS — D509 Iron deficiency anemia, unspecified: Secondary | ICD-10-CM | POA: Diagnosis not present

## 2019-04-04 DIAGNOSIS — N186 End stage renal disease: Secondary | ICD-10-CM | POA: Diagnosis not present

## 2019-04-04 DIAGNOSIS — D631 Anemia in chronic kidney disease: Secondary | ICD-10-CM | POA: Diagnosis not present

## 2019-04-04 DIAGNOSIS — E1129 Type 2 diabetes mellitus with other diabetic kidney complication: Secondary | ICD-10-CM | POA: Diagnosis not present

## 2019-04-04 DIAGNOSIS — N2581 Secondary hyperparathyroidism of renal origin: Secondary | ICD-10-CM | POA: Diagnosis not present

## 2019-04-07 DIAGNOSIS — D631 Anemia in chronic kidney disease: Secondary | ICD-10-CM | POA: Diagnosis not present

## 2019-04-07 DIAGNOSIS — E1129 Type 2 diabetes mellitus with other diabetic kidney complication: Secondary | ICD-10-CM | POA: Diagnosis not present

## 2019-04-07 DIAGNOSIS — D509 Iron deficiency anemia, unspecified: Secondary | ICD-10-CM | POA: Diagnosis not present

## 2019-04-07 DIAGNOSIS — N2581 Secondary hyperparathyroidism of renal origin: Secondary | ICD-10-CM | POA: Diagnosis not present

## 2019-04-07 DIAGNOSIS — N186 End stage renal disease: Secondary | ICD-10-CM | POA: Diagnosis not present

## 2019-04-09 DIAGNOSIS — E1129 Type 2 diabetes mellitus with other diabetic kidney complication: Secondary | ICD-10-CM | POA: Diagnosis not present

## 2019-04-09 DIAGNOSIS — N2581 Secondary hyperparathyroidism of renal origin: Secondary | ICD-10-CM | POA: Diagnosis not present

## 2019-04-09 DIAGNOSIS — D631 Anemia in chronic kidney disease: Secondary | ICD-10-CM | POA: Diagnosis not present

## 2019-04-09 DIAGNOSIS — N186 End stage renal disease: Secondary | ICD-10-CM | POA: Diagnosis not present

## 2019-04-09 DIAGNOSIS — D509 Iron deficiency anemia, unspecified: Secondary | ICD-10-CM | POA: Diagnosis not present

## 2019-04-11 DIAGNOSIS — D509 Iron deficiency anemia, unspecified: Secondary | ICD-10-CM | POA: Diagnosis not present

## 2019-04-11 DIAGNOSIS — E1129 Type 2 diabetes mellitus with other diabetic kidney complication: Secondary | ICD-10-CM | POA: Diagnosis not present

## 2019-04-11 DIAGNOSIS — N186 End stage renal disease: Secondary | ICD-10-CM | POA: Diagnosis not present

## 2019-04-11 DIAGNOSIS — N2581 Secondary hyperparathyroidism of renal origin: Secondary | ICD-10-CM | POA: Diagnosis not present

## 2019-04-11 DIAGNOSIS — D631 Anemia in chronic kidney disease: Secondary | ICD-10-CM | POA: Diagnosis not present

## 2019-04-14 ENCOUNTER — Other Ambulatory Visit: Payer: Self-pay

## 2019-04-14 DIAGNOSIS — D631 Anemia in chronic kidney disease: Secondary | ICD-10-CM | POA: Diagnosis not present

## 2019-04-14 DIAGNOSIS — N2581 Secondary hyperparathyroidism of renal origin: Secondary | ICD-10-CM | POA: Diagnosis not present

## 2019-04-14 DIAGNOSIS — E1129 Type 2 diabetes mellitus with other diabetic kidney complication: Secondary | ICD-10-CM | POA: Diagnosis not present

## 2019-04-14 DIAGNOSIS — N186 End stage renal disease: Secondary | ICD-10-CM | POA: Diagnosis not present

## 2019-04-14 DIAGNOSIS — D509 Iron deficiency anemia, unspecified: Secondary | ICD-10-CM | POA: Diagnosis not present

## 2019-04-14 MED ORDER — AMLODIPINE BESYLATE 10 MG PO TABS: 10.0000 mg | ORAL_TABLET | Freq: Every day | ORAL | 3 refills | Status: DC

## 2019-04-16 DIAGNOSIS — N2581 Secondary hyperparathyroidism of renal origin: Secondary | ICD-10-CM | POA: Diagnosis not present

## 2019-04-16 DIAGNOSIS — N186 End stage renal disease: Secondary | ICD-10-CM | POA: Diagnosis not present

## 2019-04-16 DIAGNOSIS — D509 Iron deficiency anemia, unspecified: Secondary | ICD-10-CM | POA: Diagnosis not present

## 2019-04-16 DIAGNOSIS — E1129 Type 2 diabetes mellitus with other diabetic kidney complication: Secondary | ICD-10-CM | POA: Diagnosis not present

## 2019-04-16 DIAGNOSIS — D631 Anemia in chronic kidney disease: Secondary | ICD-10-CM | POA: Diagnosis not present

## 2019-04-18 DIAGNOSIS — D631 Anemia in chronic kidney disease: Secondary | ICD-10-CM | POA: Diagnosis not present

## 2019-04-18 DIAGNOSIS — N186 End stage renal disease: Secondary | ICD-10-CM | POA: Diagnosis not present

## 2019-04-18 DIAGNOSIS — E1129 Type 2 diabetes mellitus with other diabetic kidney complication: Secondary | ICD-10-CM | POA: Diagnosis not present

## 2019-04-18 DIAGNOSIS — D509 Iron deficiency anemia, unspecified: Secondary | ICD-10-CM | POA: Diagnosis not present

## 2019-04-18 DIAGNOSIS — N2581 Secondary hyperparathyroidism of renal origin: Secondary | ICD-10-CM | POA: Diagnosis not present

## 2019-04-21 DIAGNOSIS — E1129 Type 2 diabetes mellitus with other diabetic kidney complication: Secondary | ICD-10-CM | POA: Diagnosis not present

## 2019-04-21 DIAGNOSIS — N2581 Secondary hyperparathyroidism of renal origin: Secondary | ICD-10-CM | POA: Diagnosis not present

## 2019-04-21 DIAGNOSIS — D509 Iron deficiency anemia, unspecified: Secondary | ICD-10-CM | POA: Diagnosis not present

## 2019-04-21 DIAGNOSIS — D631 Anemia in chronic kidney disease: Secondary | ICD-10-CM | POA: Diagnosis not present

## 2019-04-21 DIAGNOSIS — N186 End stage renal disease: Secondary | ICD-10-CM | POA: Diagnosis not present

## 2019-04-23 DIAGNOSIS — N2581 Secondary hyperparathyroidism of renal origin: Secondary | ICD-10-CM | POA: Diagnosis not present

## 2019-04-23 DIAGNOSIS — D631 Anemia in chronic kidney disease: Secondary | ICD-10-CM | POA: Diagnosis not present

## 2019-04-23 DIAGNOSIS — N186 End stage renal disease: Secondary | ICD-10-CM | POA: Diagnosis not present

## 2019-04-23 DIAGNOSIS — E1129 Type 2 diabetes mellitus with other diabetic kidney complication: Secondary | ICD-10-CM | POA: Diagnosis not present

## 2019-04-23 DIAGNOSIS — D509 Iron deficiency anemia, unspecified: Secondary | ICD-10-CM | POA: Diagnosis not present

## 2019-04-25 DIAGNOSIS — N2581 Secondary hyperparathyroidism of renal origin: Secondary | ICD-10-CM | POA: Diagnosis not present

## 2019-04-25 DIAGNOSIS — D509 Iron deficiency anemia, unspecified: Secondary | ICD-10-CM | POA: Diagnosis not present

## 2019-04-25 DIAGNOSIS — N186 End stage renal disease: Secondary | ICD-10-CM | POA: Diagnosis not present

## 2019-04-25 DIAGNOSIS — D631 Anemia in chronic kidney disease: Secondary | ICD-10-CM | POA: Diagnosis not present

## 2019-04-25 DIAGNOSIS — E1129 Type 2 diabetes mellitus with other diabetic kidney complication: Secondary | ICD-10-CM | POA: Diagnosis not present

## 2019-04-28 DIAGNOSIS — N186 End stage renal disease: Secondary | ICD-10-CM | POA: Diagnosis not present

## 2019-04-28 DIAGNOSIS — D631 Anemia in chronic kidney disease: Secondary | ICD-10-CM | POA: Diagnosis not present

## 2019-04-28 DIAGNOSIS — N2581 Secondary hyperparathyroidism of renal origin: Secondary | ICD-10-CM | POA: Diagnosis not present

## 2019-04-28 DIAGNOSIS — E1129 Type 2 diabetes mellitus with other diabetic kidney complication: Secondary | ICD-10-CM | POA: Diagnosis not present

## 2019-04-28 DIAGNOSIS — D509 Iron deficiency anemia, unspecified: Secondary | ICD-10-CM | POA: Diagnosis not present

## 2019-04-30 DIAGNOSIS — N2581 Secondary hyperparathyroidism of renal origin: Secondary | ICD-10-CM | POA: Diagnosis not present

## 2019-04-30 DIAGNOSIS — D509 Iron deficiency anemia, unspecified: Secondary | ICD-10-CM | POA: Diagnosis not present

## 2019-04-30 DIAGNOSIS — N186 End stage renal disease: Secondary | ICD-10-CM | POA: Diagnosis not present

## 2019-04-30 DIAGNOSIS — D631 Anemia in chronic kidney disease: Secondary | ICD-10-CM | POA: Diagnosis not present

## 2019-04-30 DIAGNOSIS — E1129 Type 2 diabetes mellitus with other diabetic kidney complication: Secondary | ICD-10-CM | POA: Diagnosis not present

## 2019-05-02 DIAGNOSIS — N2581 Secondary hyperparathyroidism of renal origin: Secondary | ICD-10-CM | POA: Diagnosis not present

## 2019-05-02 DIAGNOSIS — D509 Iron deficiency anemia, unspecified: Secondary | ICD-10-CM | POA: Diagnosis not present

## 2019-05-02 DIAGNOSIS — D631 Anemia in chronic kidney disease: Secondary | ICD-10-CM | POA: Diagnosis not present

## 2019-05-02 DIAGNOSIS — N186 End stage renal disease: Secondary | ICD-10-CM | POA: Diagnosis not present

## 2019-05-02 DIAGNOSIS — E1129 Type 2 diabetes mellitus with other diabetic kidney complication: Secondary | ICD-10-CM | POA: Diagnosis not present

## 2019-05-04 DIAGNOSIS — N040 Nephrotic syndrome with minor glomerular abnormality: Secondary | ICD-10-CM | POA: Diagnosis not present

## 2019-05-04 DIAGNOSIS — Z992 Dependence on renal dialysis: Secondary | ICD-10-CM | POA: Diagnosis not present

## 2019-05-04 DIAGNOSIS — N186 End stage renal disease: Secondary | ICD-10-CM | POA: Diagnosis not present

## 2019-05-05 DIAGNOSIS — N186 End stage renal disease: Secondary | ICD-10-CM | POA: Diagnosis not present

## 2019-05-05 DIAGNOSIS — N2581 Secondary hyperparathyroidism of renal origin: Secondary | ICD-10-CM | POA: Diagnosis not present

## 2019-05-05 DIAGNOSIS — Z23 Encounter for immunization: Secondary | ICD-10-CM | POA: Diagnosis not present

## 2019-05-05 DIAGNOSIS — E1129 Type 2 diabetes mellitus with other diabetic kidney complication: Secondary | ICD-10-CM | POA: Diagnosis not present

## 2019-05-05 DIAGNOSIS — D631 Anemia in chronic kidney disease: Secondary | ICD-10-CM | POA: Diagnosis not present

## 2019-05-05 DIAGNOSIS — D509 Iron deficiency anemia, unspecified: Secondary | ICD-10-CM | POA: Diagnosis not present

## 2019-05-07 DIAGNOSIS — D631 Anemia in chronic kidney disease: Secondary | ICD-10-CM | POA: Diagnosis not present

## 2019-05-07 DIAGNOSIS — D509 Iron deficiency anemia, unspecified: Secondary | ICD-10-CM | POA: Diagnosis not present

## 2019-05-07 DIAGNOSIS — Z23 Encounter for immunization: Secondary | ICD-10-CM | POA: Diagnosis not present

## 2019-05-07 DIAGNOSIS — E1129 Type 2 diabetes mellitus with other diabetic kidney complication: Secondary | ICD-10-CM | POA: Diagnosis not present

## 2019-05-07 DIAGNOSIS — N2581 Secondary hyperparathyroidism of renal origin: Secondary | ICD-10-CM | POA: Diagnosis not present

## 2019-05-07 DIAGNOSIS — N186 End stage renal disease: Secondary | ICD-10-CM | POA: Diagnosis not present

## 2019-05-09 DIAGNOSIS — E1129 Type 2 diabetes mellitus with other diabetic kidney complication: Secondary | ICD-10-CM | POA: Diagnosis not present

## 2019-05-09 DIAGNOSIS — Z23 Encounter for immunization: Secondary | ICD-10-CM | POA: Diagnosis not present

## 2019-05-09 DIAGNOSIS — N186 End stage renal disease: Secondary | ICD-10-CM | POA: Diagnosis not present

## 2019-05-09 DIAGNOSIS — N2581 Secondary hyperparathyroidism of renal origin: Secondary | ICD-10-CM | POA: Diagnosis not present

## 2019-05-09 DIAGNOSIS — D631 Anemia in chronic kidney disease: Secondary | ICD-10-CM | POA: Diagnosis not present

## 2019-05-09 DIAGNOSIS — D509 Iron deficiency anemia, unspecified: Secondary | ICD-10-CM | POA: Diagnosis not present

## 2019-05-12 DIAGNOSIS — Z23 Encounter for immunization: Secondary | ICD-10-CM | POA: Diagnosis not present

## 2019-05-12 DIAGNOSIS — D631 Anemia in chronic kidney disease: Secondary | ICD-10-CM | POA: Diagnosis not present

## 2019-05-12 DIAGNOSIS — E1129 Type 2 diabetes mellitus with other diabetic kidney complication: Secondary | ICD-10-CM | POA: Diagnosis not present

## 2019-05-12 DIAGNOSIS — D509 Iron deficiency anemia, unspecified: Secondary | ICD-10-CM | POA: Diagnosis not present

## 2019-05-12 DIAGNOSIS — N2581 Secondary hyperparathyroidism of renal origin: Secondary | ICD-10-CM | POA: Diagnosis not present

## 2019-05-12 DIAGNOSIS — N186 End stage renal disease: Secondary | ICD-10-CM | POA: Diagnosis not present

## 2019-05-14 DIAGNOSIS — E1129 Type 2 diabetes mellitus with other diabetic kidney complication: Secondary | ICD-10-CM | POA: Diagnosis not present

## 2019-05-14 DIAGNOSIS — N2581 Secondary hyperparathyroidism of renal origin: Secondary | ICD-10-CM | POA: Diagnosis not present

## 2019-05-14 DIAGNOSIS — Z23 Encounter for immunization: Secondary | ICD-10-CM | POA: Diagnosis not present

## 2019-05-14 DIAGNOSIS — D631 Anemia in chronic kidney disease: Secondary | ICD-10-CM | POA: Diagnosis not present

## 2019-05-14 DIAGNOSIS — D509 Iron deficiency anemia, unspecified: Secondary | ICD-10-CM | POA: Diagnosis not present

## 2019-05-14 DIAGNOSIS — N186 End stage renal disease: Secondary | ICD-10-CM | POA: Diagnosis not present

## 2019-05-16 DIAGNOSIS — D509 Iron deficiency anemia, unspecified: Secondary | ICD-10-CM | POA: Diagnosis not present

## 2019-05-16 DIAGNOSIS — D631 Anemia in chronic kidney disease: Secondary | ICD-10-CM | POA: Diagnosis not present

## 2019-05-16 DIAGNOSIS — N186 End stage renal disease: Secondary | ICD-10-CM | POA: Diagnosis not present

## 2019-05-16 DIAGNOSIS — E1129 Type 2 diabetes mellitus with other diabetic kidney complication: Secondary | ICD-10-CM | POA: Diagnosis not present

## 2019-05-16 DIAGNOSIS — N2581 Secondary hyperparathyroidism of renal origin: Secondary | ICD-10-CM | POA: Diagnosis not present

## 2019-05-16 DIAGNOSIS — Z23 Encounter for immunization: Secondary | ICD-10-CM | POA: Diagnosis not present

## 2019-05-19 DIAGNOSIS — N186 End stage renal disease: Secondary | ICD-10-CM | POA: Diagnosis not present

## 2019-05-19 DIAGNOSIS — D631 Anemia in chronic kidney disease: Secondary | ICD-10-CM | POA: Diagnosis not present

## 2019-05-19 DIAGNOSIS — Z23 Encounter for immunization: Secondary | ICD-10-CM | POA: Diagnosis not present

## 2019-05-19 DIAGNOSIS — N2581 Secondary hyperparathyroidism of renal origin: Secondary | ICD-10-CM | POA: Diagnosis not present

## 2019-05-19 DIAGNOSIS — E1129 Type 2 diabetes mellitus with other diabetic kidney complication: Secondary | ICD-10-CM | POA: Diagnosis not present

## 2019-05-19 DIAGNOSIS — D509 Iron deficiency anemia, unspecified: Secondary | ICD-10-CM | POA: Diagnosis not present

## 2019-05-21 DIAGNOSIS — E1129 Type 2 diabetes mellitus with other diabetic kidney complication: Secondary | ICD-10-CM | POA: Diagnosis not present

## 2019-05-21 DIAGNOSIS — Z23 Encounter for immunization: Secondary | ICD-10-CM | POA: Diagnosis not present

## 2019-05-21 DIAGNOSIS — N2581 Secondary hyperparathyroidism of renal origin: Secondary | ICD-10-CM | POA: Diagnosis not present

## 2019-05-21 DIAGNOSIS — N186 End stage renal disease: Secondary | ICD-10-CM | POA: Diagnosis not present

## 2019-05-21 DIAGNOSIS — D509 Iron deficiency anemia, unspecified: Secondary | ICD-10-CM | POA: Diagnosis not present

## 2019-05-21 DIAGNOSIS — D631 Anemia in chronic kidney disease: Secondary | ICD-10-CM | POA: Diagnosis not present

## 2019-05-23 DIAGNOSIS — D509 Iron deficiency anemia, unspecified: Secondary | ICD-10-CM | POA: Diagnosis not present

## 2019-05-23 DIAGNOSIS — Z23 Encounter for immunization: Secondary | ICD-10-CM | POA: Diagnosis not present

## 2019-05-23 DIAGNOSIS — N2581 Secondary hyperparathyroidism of renal origin: Secondary | ICD-10-CM | POA: Diagnosis not present

## 2019-05-23 DIAGNOSIS — D631 Anemia in chronic kidney disease: Secondary | ICD-10-CM | POA: Diagnosis not present

## 2019-05-23 DIAGNOSIS — E1129 Type 2 diabetes mellitus with other diabetic kidney complication: Secondary | ICD-10-CM | POA: Diagnosis not present

## 2019-05-23 DIAGNOSIS — N186 End stage renal disease: Secondary | ICD-10-CM | POA: Diagnosis not present

## 2019-05-26 DIAGNOSIS — E1129 Type 2 diabetes mellitus with other diabetic kidney complication: Secondary | ICD-10-CM | POA: Diagnosis not present

## 2019-05-26 DIAGNOSIS — D631 Anemia in chronic kidney disease: Secondary | ICD-10-CM | POA: Diagnosis not present

## 2019-05-26 DIAGNOSIS — N186 End stage renal disease: Secondary | ICD-10-CM | POA: Diagnosis not present

## 2019-05-26 DIAGNOSIS — Z23 Encounter for immunization: Secondary | ICD-10-CM | POA: Diagnosis not present

## 2019-05-26 DIAGNOSIS — D509 Iron deficiency anemia, unspecified: Secondary | ICD-10-CM | POA: Diagnosis not present

## 2019-05-26 DIAGNOSIS — N2581 Secondary hyperparathyroidism of renal origin: Secondary | ICD-10-CM | POA: Diagnosis not present

## 2019-05-28 DIAGNOSIS — N186 End stage renal disease: Secondary | ICD-10-CM | POA: Diagnosis not present

## 2019-05-28 DIAGNOSIS — D509 Iron deficiency anemia, unspecified: Secondary | ICD-10-CM | POA: Diagnosis not present

## 2019-05-28 DIAGNOSIS — E1129 Type 2 diabetes mellitus with other diabetic kidney complication: Secondary | ICD-10-CM | POA: Diagnosis not present

## 2019-05-28 DIAGNOSIS — N2581 Secondary hyperparathyroidism of renal origin: Secondary | ICD-10-CM | POA: Diagnosis not present

## 2019-05-28 DIAGNOSIS — D631 Anemia in chronic kidney disease: Secondary | ICD-10-CM | POA: Diagnosis not present

## 2019-05-28 DIAGNOSIS — Z23 Encounter for immunization: Secondary | ICD-10-CM | POA: Diagnosis not present

## 2019-05-30 DIAGNOSIS — Z23 Encounter for immunization: Secondary | ICD-10-CM | POA: Diagnosis not present

## 2019-05-30 DIAGNOSIS — N2581 Secondary hyperparathyroidism of renal origin: Secondary | ICD-10-CM | POA: Diagnosis not present

## 2019-05-30 DIAGNOSIS — E1129 Type 2 diabetes mellitus with other diabetic kidney complication: Secondary | ICD-10-CM | POA: Diagnosis not present

## 2019-05-30 DIAGNOSIS — D631 Anemia in chronic kidney disease: Secondary | ICD-10-CM | POA: Diagnosis not present

## 2019-05-30 DIAGNOSIS — D509 Iron deficiency anemia, unspecified: Secondary | ICD-10-CM | POA: Diagnosis not present

## 2019-05-30 DIAGNOSIS — N186 End stage renal disease: Secondary | ICD-10-CM | POA: Diagnosis not present

## 2019-06-02 DIAGNOSIS — D509 Iron deficiency anemia, unspecified: Secondary | ICD-10-CM | POA: Diagnosis not present

## 2019-06-02 DIAGNOSIS — E1129 Type 2 diabetes mellitus with other diabetic kidney complication: Secondary | ICD-10-CM | POA: Diagnosis not present

## 2019-06-02 DIAGNOSIS — D631 Anemia in chronic kidney disease: Secondary | ICD-10-CM | POA: Diagnosis not present

## 2019-06-02 DIAGNOSIS — Z23 Encounter for immunization: Secondary | ICD-10-CM | POA: Diagnosis not present

## 2019-06-02 DIAGNOSIS — N2581 Secondary hyperparathyroidism of renal origin: Secondary | ICD-10-CM | POA: Diagnosis not present

## 2019-06-02 DIAGNOSIS — N186 End stage renal disease: Secondary | ICD-10-CM | POA: Diagnosis not present

## 2019-06-03 DIAGNOSIS — N186 End stage renal disease: Secondary | ICD-10-CM | POA: Diagnosis not present

## 2019-06-03 DIAGNOSIS — N040 Nephrotic syndrome with minor glomerular abnormality: Secondary | ICD-10-CM | POA: Diagnosis not present

## 2019-06-03 DIAGNOSIS — Z992 Dependence on renal dialysis: Secondary | ICD-10-CM | POA: Diagnosis not present

## 2019-06-04 DIAGNOSIS — N186 End stage renal disease: Secondary | ICD-10-CM | POA: Diagnosis not present

## 2019-06-04 DIAGNOSIS — D631 Anemia in chronic kidney disease: Secondary | ICD-10-CM | POA: Diagnosis not present

## 2019-06-04 DIAGNOSIS — D509 Iron deficiency anemia, unspecified: Secondary | ICD-10-CM | POA: Diagnosis not present

## 2019-06-04 DIAGNOSIS — N2581 Secondary hyperparathyroidism of renal origin: Secondary | ICD-10-CM | POA: Diagnosis not present

## 2019-06-04 DIAGNOSIS — E1129 Type 2 diabetes mellitus with other diabetic kidney complication: Secondary | ICD-10-CM | POA: Diagnosis not present

## 2019-06-05 ENCOUNTER — Encounter (INDEPENDENT_AMBULATORY_CARE_PROVIDER_SITE_OTHER): Payer: Medicare Other

## 2019-06-05 ENCOUNTER — Ambulatory Visit (INDEPENDENT_AMBULATORY_CARE_PROVIDER_SITE_OTHER): Payer: Medicare Other | Admitting: Vascular Surgery

## 2019-06-06 DIAGNOSIS — N186 End stage renal disease: Secondary | ICD-10-CM | POA: Diagnosis not present

## 2019-06-06 DIAGNOSIS — D509 Iron deficiency anemia, unspecified: Secondary | ICD-10-CM | POA: Diagnosis not present

## 2019-06-06 DIAGNOSIS — E1129 Type 2 diabetes mellitus with other diabetic kidney complication: Secondary | ICD-10-CM | POA: Diagnosis not present

## 2019-06-06 DIAGNOSIS — D631 Anemia in chronic kidney disease: Secondary | ICD-10-CM | POA: Diagnosis not present

## 2019-06-06 DIAGNOSIS — N2581 Secondary hyperparathyroidism of renal origin: Secondary | ICD-10-CM | POA: Diagnosis not present

## 2019-06-09 DIAGNOSIS — D631 Anemia in chronic kidney disease: Secondary | ICD-10-CM | POA: Diagnosis not present

## 2019-06-09 DIAGNOSIS — N186 End stage renal disease: Secondary | ICD-10-CM | POA: Diagnosis not present

## 2019-06-09 DIAGNOSIS — D509 Iron deficiency anemia, unspecified: Secondary | ICD-10-CM | POA: Diagnosis not present

## 2019-06-09 DIAGNOSIS — N2581 Secondary hyperparathyroidism of renal origin: Secondary | ICD-10-CM | POA: Diagnosis not present

## 2019-06-09 DIAGNOSIS — E1129 Type 2 diabetes mellitus with other diabetic kidney complication: Secondary | ICD-10-CM | POA: Diagnosis not present

## 2019-06-11 DIAGNOSIS — D631 Anemia in chronic kidney disease: Secondary | ICD-10-CM | POA: Diagnosis not present

## 2019-06-11 DIAGNOSIS — D509 Iron deficiency anemia, unspecified: Secondary | ICD-10-CM | POA: Diagnosis not present

## 2019-06-11 DIAGNOSIS — N186 End stage renal disease: Secondary | ICD-10-CM | POA: Diagnosis not present

## 2019-06-11 DIAGNOSIS — N2581 Secondary hyperparathyroidism of renal origin: Secondary | ICD-10-CM | POA: Diagnosis not present

## 2019-06-11 DIAGNOSIS — E1129 Type 2 diabetes mellitus with other diabetic kidney complication: Secondary | ICD-10-CM | POA: Diagnosis not present

## 2019-06-13 DIAGNOSIS — D509 Iron deficiency anemia, unspecified: Secondary | ICD-10-CM | POA: Diagnosis not present

## 2019-06-13 DIAGNOSIS — D631 Anemia in chronic kidney disease: Secondary | ICD-10-CM | POA: Diagnosis not present

## 2019-06-13 DIAGNOSIS — N2581 Secondary hyperparathyroidism of renal origin: Secondary | ICD-10-CM | POA: Diagnosis not present

## 2019-06-13 DIAGNOSIS — N186 End stage renal disease: Secondary | ICD-10-CM | POA: Diagnosis not present

## 2019-06-13 DIAGNOSIS — E1129 Type 2 diabetes mellitus with other diabetic kidney complication: Secondary | ICD-10-CM | POA: Diagnosis not present

## 2019-06-16 DIAGNOSIS — E1129 Type 2 diabetes mellitus with other diabetic kidney complication: Secondary | ICD-10-CM | POA: Diagnosis not present

## 2019-06-16 DIAGNOSIS — D631 Anemia in chronic kidney disease: Secondary | ICD-10-CM | POA: Diagnosis not present

## 2019-06-16 DIAGNOSIS — N2581 Secondary hyperparathyroidism of renal origin: Secondary | ICD-10-CM | POA: Diagnosis not present

## 2019-06-16 DIAGNOSIS — N186 End stage renal disease: Secondary | ICD-10-CM | POA: Diagnosis not present

## 2019-06-16 DIAGNOSIS — D509 Iron deficiency anemia, unspecified: Secondary | ICD-10-CM | POA: Diagnosis not present

## 2019-06-18 DIAGNOSIS — D631 Anemia in chronic kidney disease: Secondary | ICD-10-CM | POA: Diagnosis not present

## 2019-06-18 DIAGNOSIS — E1129 Type 2 diabetes mellitus with other diabetic kidney complication: Secondary | ICD-10-CM | POA: Diagnosis not present

## 2019-06-18 DIAGNOSIS — D509 Iron deficiency anemia, unspecified: Secondary | ICD-10-CM | POA: Diagnosis not present

## 2019-06-18 DIAGNOSIS — N2581 Secondary hyperparathyroidism of renal origin: Secondary | ICD-10-CM | POA: Diagnosis not present

## 2019-06-18 DIAGNOSIS — N186 End stage renal disease: Secondary | ICD-10-CM | POA: Diagnosis not present

## 2019-06-20 DIAGNOSIS — D509 Iron deficiency anemia, unspecified: Secondary | ICD-10-CM | POA: Diagnosis not present

## 2019-06-20 DIAGNOSIS — N186 End stage renal disease: Secondary | ICD-10-CM | POA: Diagnosis not present

## 2019-06-20 DIAGNOSIS — E1129 Type 2 diabetes mellitus with other diabetic kidney complication: Secondary | ICD-10-CM | POA: Diagnosis not present

## 2019-06-20 DIAGNOSIS — D631 Anemia in chronic kidney disease: Secondary | ICD-10-CM | POA: Diagnosis not present

## 2019-06-20 DIAGNOSIS — N2581 Secondary hyperparathyroidism of renal origin: Secondary | ICD-10-CM | POA: Diagnosis not present

## 2019-06-23 DIAGNOSIS — D631 Anemia in chronic kidney disease: Secondary | ICD-10-CM | POA: Diagnosis not present

## 2019-06-23 DIAGNOSIS — D509 Iron deficiency anemia, unspecified: Secondary | ICD-10-CM | POA: Diagnosis not present

## 2019-06-23 DIAGNOSIS — N2581 Secondary hyperparathyroidism of renal origin: Secondary | ICD-10-CM | POA: Diagnosis not present

## 2019-06-23 DIAGNOSIS — E1129 Type 2 diabetes mellitus with other diabetic kidney complication: Secondary | ICD-10-CM | POA: Diagnosis not present

## 2019-06-23 DIAGNOSIS — N186 End stage renal disease: Secondary | ICD-10-CM | POA: Diagnosis not present

## 2019-06-25 DIAGNOSIS — E1129 Type 2 diabetes mellitus with other diabetic kidney complication: Secondary | ICD-10-CM | POA: Diagnosis not present

## 2019-06-25 DIAGNOSIS — N2581 Secondary hyperparathyroidism of renal origin: Secondary | ICD-10-CM | POA: Diagnosis not present

## 2019-06-25 DIAGNOSIS — D509 Iron deficiency anemia, unspecified: Secondary | ICD-10-CM | POA: Diagnosis not present

## 2019-06-25 DIAGNOSIS — N186 End stage renal disease: Secondary | ICD-10-CM | POA: Diagnosis not present

## 2019-06-25 DIAGNOSIS — D631 Anemia in chronic kidney disease: Secondary | ICD-10-CM | POA: Diagnosis not present

## 2019-06-27 DIAGNOSIS — N2581 Secondary hyperparathyroidism of renal origin: Secondary | ICD-10-CM | POA: Diagnosis not present

## 2019-06-27 DIAGNOSIS — N186 End stage renal disease: Secondary | ICD-10-CM | POA: Diagnosis not present

## 2019-06-27 DIAGNOSIS — D631 Anemia in chronic kidney disease: Secondary | ICD-10-CM | POA: Diagnosis not present

## 2019-06-27 DIAGNOSIS — D509 Iron deficiency anemia, unspecified: Secondary | ICD-10-CM | POA: Diagnosis not present

## 2019-06-27 DIAGNOSIS — E1129 Type 2 diabetes mellitus with other diabetic kidney complication: Secondary | ICD-10-CM | POA: Diagnosis not present

## 2019-06-30 DIAGNOSIS — D509 Iron deficiency anemia, unspecified: Secondary | ICD-10-CM | POA: Diagnosis not present

## 2019-06-30 DIAGNOSIS — N186 End stage renal disease: Secondary | ICD-10-CM | POA: Diagnosis not present

## 2019-06-30 DIAGNOSIS — N2581 Secondary hyperparathyroidism of renal origin: Secondary | ICD-10-CM | POA: Diagnosis not present

## 2019-06-30 DIAGNOSIS — E1129 Type 2 diabetes mellitus with other diabetic kidney complication: Secondary | ICD-10-CM | POA: Diagnosis not present

## 2019-06-30 DIAGNOSIS — D631 Anemia in chronic kidney disease: Secondary | ICD-10-CM | POA: Diagnosis not present

## 2019-07-02 DIAGNOSIS — D509 Iron deficiency anemia, unspecified: Secondary | ICD-10-CM | POA: Diagnosis not present

## 2019-07-02 DIAGNOSIS — E1129 Type 2 diabetes mellitus with other diabetic kidney complication: Secondary | ICD-10-CM | POA: Diagnosis not present

## 2019-07-02 DIAGNOSIS — D631 Anemia in chronic kidney disease: Secondary | ICD-10-CM | POA: Diagnosis not present

## 2019-07-02 DIAGNOSIS — N186 End stage renal disease: Secondary | ICD-10-CM | POA: Diagnosis not present

## 2019-07-02 DIAGNOSIS — N2581 Secondary hyperparathyroidism of renal origin: Secondary | ICD-10-CM | POA: Diagnosis not present

## 2019-07-04 DIAGNOSIS — N2581 Secondary hyperparathyroidism of renal origin: Secondary | ICD-10-CM | POA: Diagnosis not present

## 2019-07-04 DIAGNOSIS — N040 Nephrotic syndrome with minor glomerular abnormality: Secondary | ICD-10-CM | POA: Diagnosis not present

## 2019-07-04 DIAGNOSIS — N186 End stage renal disease: Secondary | ICD-10-CM | POA: Diagnosis not present

## 2019-07-04 DIAGNOSIS — D631 Anemia in chronic kidney disease: Secondary | ICD-10-CM | POA: Diagnosis not present

## 2019-07-04 DIAGNOSIS — D509 Iron deficiency anemia, unspecified: Secondary | ICD-10-CM | POA: Diagnosis not present

## 2019-07-04 DIAGNOSIS — Z992 Dependence on renal dialysis: Secondary | ICD-10-CM | POA: Diagnosis not present

## 2019-07-04 DIAGNOSIS — E1129 Type 2 diabetes mellitus with other diabetic kidney complication: Secondary | ICD-10-CM | POA: Diagnosis not present

## 2019-07-07 DIAGNOSIS — N186 End stage renal disease: Secondary | ICD-10-CM | POA: Diagnosis not present

## 2019-07-07 DIAGNOSIS — D509 Iron deficiency anemia, unspecified: Secondary | ICD-10-CM | POA: Diagnosis not present

## 2019-07-07 DIAGNOSIS — N2581 Secondary hyperparathyroidism of renal origin: Secondary | ICD-10-CM | POA: Diagnosis not present

## 2019-07-07 DIAGNOSIS — D631 Anemia in chronic kidney disease: Secondary | ICD-10-CM | POA: Diagnosis not present

## 2019-07-07 DIAGNOSIS — E1129 Type 2 diabetes mellitus with other diabetic kidney complication: Secondary | ICD-10-CM | POA: Diagnosis not present

## 2019-07-07 DIAGNOSIS — Z992 Dependence on renal dialysis: Secondary | ICD-10-CM | POA: Diagnosis not present

## 2019-07-07 DIAGNOSIS — Z23 Encounter for immunization: Secondary | ICD-10-CM | POA: Diagnosis not present

## 2019-07-09 DIAGNOSIS — E1129 Type 2 diabetes mellitus with other diabetic kidney complication: Secondary | ICD-10-CM | POA: Diagnosis not present

## 2019-07-09 DIAGNOSIS — D509 Iron deficiency anemia, unspecified: Secondary | ICD-10-CM | POA: Diagnosis not present

## 2019-07-09 DIAGNOSIS — Z992 Dependence on renal dialysis: Secondary | ICD-10-CM | POA: Diagnosis not present

## 2019-07-09 DIAGNOSIS — N186 End stage renal disease: Secondary | ICD-10-CM | POA: Diagnosis not present

## 2019-07-09 DIAGNOSIS — N2581 Secondary hyperparathyroidism of renal origin: Secondary | ICD-10-CM | POA: Diagnosis not present

## 2019-07-09 DIAGNOSIS — D631 Anemia in chronic kidney disease: Secondary | ICD-10-CM | POA: Diagnosis not present

## 2019-07-11 DIAGNOSIS — D509 Iron deficiency anemia, unspecified: Secondary | ICD-10-CM | POA: Diagnosis not present

## 2019-07-11 DIAGNOSIS — E1129 Type 2 diabetes mellitus with other diabetic kidney complication: Secondary | ICD-10-CM | POA: Diagnosis not present

## 2019-07-11 DIAGNOSIS — N2581 Secondary hyperparathyroidism of renal origin: Secondary | ICD-10-CM | POA: Diagnosis not present

## 2019-07-11 DIAGNOSIS — D631 Anemia in chronic kidney disease: Secondary | ICD-10-CM | POA: Diagnosis not present

## 2019-07-11 DIAGNOSIS — N186 End stage renal disease: Secondary | ICD-10-CM | POA: Diagnosis not present

## 2019-07-11 DIAGNOSIS — Z992 Dependence on renal dialysis: Secondary | ICD-10-CM | POA: Diagnosis not present

## 2019-07-14 DIAGNOSIS — N2581 Secondary hyperparathyroidism of renal origin: Secondary | ICD-10-CM | POA: Diagnosis not present

## 2019-07-14 DIAGNOSIS — D509 Iron deficiency anemia, unspecified: Secondary | ICD-10-CM | POA: Diagnosis not present

## 2019-07-14 DIAGNOSIS — E1129 Type 2 diabetes mellitus with other diabetic kidney complication: Secondary | ICD-10-CM | POA: Diagnosis not present

## 2019-07-14 DIAGNOSIS — N186 End stage renal disease: Secondary | ICD-10-CM | POA: Diagnosis not present

## 2019-07-14 DIAGNOSIS — D631 Anemia in chronic kidney disease: Secondary | ICD-10-CM | POA: Diagnosis not present

## 2019-07-14 DIAGNOSIS — Z992 Dependence on renal dialysis: Secondary | ICD-10-CM | POA: Diagnosis not present

## 2019-07-15 ENCOUNTER — Ambulatory Visit: Payer: Medicare Other | Admitting: Internal Medicine

## 2019-07-16 DIAGNOSIS — N2581 Secondary hyperparathyroidism of renal origin: Secondary | ICD-10-CM | POA: Diagnosis not present

## 2019-07-16 DIAGNOSIS — D509 Iron deficiency anemia, unspecified: Secondary | ICD-10-CM | POA: Diagnosis not present

## 2019-07-16 DIAGNOSIS — D631 Anemia in chronic kidney disease: Secondary | ICD-10-CM | POA: Diagnosis not present

## 2019-07-16 DIAGNOSIS — Z992 Dependence on renal dialysis: Secondary | ICD-10-CM | POA: Diagnosis not present

## 2019-07-16 DIAGNOSIS — N186 End stage renal disease: Secondary | ICD-10-CM | POA: Diagnosis not present

## 2019-07-16 DIAGNOSIS — E1129 Type 2 diabetes mellitus with other diabetic kidney complication: Secondary | ICD-10-CM | POA: Diagnosis not present

## 2019-07-18 DIAGNOSIS — D631 Anemia in chronic kidney disease: Secondary | ICD-10-CM | POA: Diagnosis not present

## 2019-07-18 DIAGNOSIS — N2581 Secondary hyperparathyroidism of renal origin: Secondary | ICD-10-CM | POA: Diagnosis not present

## 2019-07-18 DIAGNOSIS — N186 End stage renal disease: Secondary | ICD-10-CM | POA: Diagnosis not present

## 2019-07-18 DIAGNOSIS — E1129 Type 2 diabetes mellitus with other diabetic kidney complication: Secondary | ICD-10-CM | POA: Diagnosis not present

## 2019-07-18 DIAGNOSIS — D509 Iron deficiency anemia, unspecified: Secondary | ICD-10-CM | POA: Diagnosis not present

## 2019-07-18 DIAGNOSIS — Z992 Dependence on renal dialysis: Secondary | ICD-10-CM | POA: Diagnosis not present

## 2019-07-21 DIAGNOSIS — N186 End stage renal disease: Secondary | ICD-10-CM | POA: Diagnosis not present

## 2019-07-21 DIAGNOSIS — D631 Anemia in chronic kidney disease: Secondary | ICD-10-CM | POA: Diagnosis not present

## 2019-07-21 DIAGNOSIS — D509 Iron deficiency anemia, unspecified: Secondary | ICD-10-CM | POA: Diagnosis not present

## 2019-07-21 DIAGNOSIS — E1129 Type 2 diabetes mellitus with other diabetic kidney complication: Secondary | ICD-10-CM | POA: Diagnosis not present

## 2019-07-21 DIAGNOSIS — N2581 Secondary hyperparathyroidism of renal origin: Secondary | ICD-10-CM | POA: Diagnosis not present

## 2019-07-21 DIAGNOSIS — Z992 Dependence on renal dialysis: Secondary | ICD-10-CM | POA: Diagnosis not present

## 2019-07-22 ENCOUNTER — Ambulatory Visit: Payer: Medicare Other | Admitting: Internal Medicine

## 2019-07-23 ENCOUNTER — Other Ambulatory Visit: Payer: Self-pay

## 2019-07-23 DIAGNOSIS — E1129 Type 2 diabetes mellitus with other diabetic kidney complication: Secondary | ICD-10-CM | POA: Diagnosis not present

## 2019-07-23 DIAGNOSIS — N186 End stage renal disease: Secondary | ICD-10-CM | POA: Diagnosis not present

## 2019-07-23 DIAGNOSIS — D509 Iron deficiency anemia, unspecified: Secondary | ICD-10-CM | POA: Diagnosis not present

## 2019-07-23 DIAGNOSIS — D631 Anemia in chronic kidney disease: Secondary | ICD-10-CM | POA: Diagnosis not present

## 2019-07-23 DIAGNOSIS — N2581 Secondary hyperparathyroidism of renal origin: Secondary | ICD-10-CM | POA: Diagnosis not present

## 2019-07-23 DIAGNOSIS — Z992 Dependence on renal dialysis: Secondary | ICD-10-CM | POA: Diagnosis not present

## 2019-07-23 MED ORDER — AMLODIPINE BESYLATE 10 MG PO TABS: 10.0000 mg | ORAL_TABLET | Freq: Every day | ORAL | 1 refills | Status: DC

## 2019-07-25 DIAGNOSIS — E1129 Type 2 diabetes mellitus with other diabetic kidney complication: Secondary | ICD-10-CM | POA: Diagnosis not present

## 2019-07-25 DIAGNOSIS — D509 Iron deficiency anemia, unspecified: Secondary | ICD-10-CM | POA: Diagnosis not present

## 2019-07-25 DIAGNOSIS — N186 End stage renal disease: Secondary | ICD-10-CM | POA: Diagnosis not present

## 2019-07-25 DIAGNOSIS — N2581 Secondary hyperparathyroidism of renal origin: Secondary | ICD-10-CM | POA: Diagnosis not present

## 2019-07-25 DIAGNOSIS — D631 Anemia in chronic kidney disease: Secondary | ICD-10-CM | POA: Diagnosis not present

## 2019-07-25 DIAGNOSIS — Z992 Dependence on renal dialysis: Secondary | ICD-10-CM | POA: Diagnosis not present

## 2019-07-28 DIAGNOSIS — E1129 Type 2 diabetes mellitus with other diabetic kidney complication: Secondary | ICD-10-CM | POA: Diagnosis not present

## 2019-07-28 DIAGNOSIS — N186 End stage renal disease: Secondary | ICD-10-CM | POA: Diagnosis not present

## 2019-07-28 DIAGNOSIS — N2581 Secondary hyperparathyroidism of renal origin: Secondary | ICD-10-CM | POA: Diagnosis not present

## 2019-07-28 DIAGNOSIS — D631 Anemia in chronic kidney disease: Secondary | ICD-10-CM | POA: Diagnosis not present

## 2019-07-28 DIAGNOSIS — D509 Iron deficiency anemia, unspecified: Secondary | ICD-10-CM | POA: Diagnosis not present

## 2019-07-28 DIAGNOSIS — Z992 Dependence on renal dialysis: Secondary | ICD-10-CM | POA: Diagnosis not present

## 2019-07-30 DIAGNOSIS — N2581 Secondary hyperparathyroidism of renal origin: Secondary | ICD-10-CM | POA: Diagnosis not present

## 2019-07-30 DIAGNOSIS — D509 Iron deficiency anemia, unspecified: Secondary | ICD-10-CM | POA: Diagnosis not present

## 2019-07-30 DIAGNOSIS — E1129 Type 2 diabetes mellitus with other diabetic kidney complication: Secondary | ICD-10-CM | POA: Diagnosis not present

## 2019-07-30 DIAGNOSIS — Z992 Dependence on renal dialysis: Secondary | ICD-10-CM | POA: Diagnosis not present

## 2019-07-30 DIAGNOSIS — N186 End stage renal disease: Secondary | ICD-10-CM | POA: Diagnosis not present

## 2019-07-30 DIAGNOSIS — D631 Anemia in chronic kidney disease: Secondary | ICD-10-CM | POA: Diagnosis not present

## 2019-08-01 DIAGNOSIS — N186 End stage renal disease: Secondary | ICD-10-CM | POA: Diagnosis not present

## 2019-08-01 DIAGNOSIS — Z992 Dependence on renal dialysis: Secondary | ICD-10-CM | POA: Diagnosis not present

## 2019-08-01 DIAGNOSIS — D631 Anemia in chronic kidney disease: Secondary | ICD-10-CM | POA: Diagnosis not present

## 2019-08-01 DIAGNOSIS — D509 Iron deficiency anemia, unspecified: Secondary | ICD-10-CM | POA: Diagnosis not present

## 2019-08-01 DIAGNOSIS — N2581 Secondary hyperparathyroidism of renal origin: Secondary | ICD-10-CM | POA: Diagnosis not present

## 2019-08-01 DIAGNOSIS — E1129 Type 2 diabetes mellitus with other diabetic kidney complication: Secondary | ICD-10-CM | POA: Diagnosis not present

## 2019-08-04 DIAGNOSIS — N2581 Secondary hyperparathyroidism of renal origin: Secondary | ICD-10-CM | POA: Diagnosis not present

## 2019-08-04 DIAGNOSIS — N040 Nephrotic syndrome with minor glomerular abnormality: Secondary | ICD-10-CM | POA: Diagnosis not present

## 2019-08-04 DIAGNOSIS — E1129 Type 2 diabetes mellitus with other diabetic kidney complication: Secondary | ICD-10-CM | POA: Diagnosis not present

## 2019-08-04 DIAGNOSIS — D509 Iron deficiency anemia, unspecified: Secondary | ICD-10-CM | POA: Diagnosis not present

## 2019-08-04 DIAGNOSIS — D631 Anemia in chronic kidney disease: Secondary | ICD-10-CM | POA: Diagnosis not present

## 2019-08-04 DIAGNOSIS — Z992 Dependence on renal dialysis: Secondary | ICD-10-CM | POA: Diagnosis not present

## 2019-08-04 DIAGNOSIS — N186 End stage renal disease: Secondary | ICD-10-CM | POA: Diagnosis not present

## 2019-08-06 DIAGNOSIS — N2581 Secondary hyperparathyroidism of renal origin: Secondary | ICD-10-CM | POA: Diagnosis not present

## 2019-08-06 DIAGNOSIS — Z23 Encounter for immunization: Secondary | ICD-10-CM | POA: Diagnosis not present

## 2019-08-06 DIAGNOSIS — D509 Iron deficiency anemia, unspecified: Secondary | ICD-10-CM | POA: Diagnosis not present

## 2019-08-06 DIAGNOSIS — Z992 Dependence on renal dialysis: Secondary | ICD-10-CM | POA: Diagnosis not present

## 2019-08-06 DIAGNOSIS — E1129 Type 2 diabetes mellitus with other diabetic kidney complication: Secondary | ICD-10-CM | POA: Diagnosis not present

## 2019-08-06 DIAGNOSIS — D631 Anemia in chronic kidney disease: Secondary | ICD-10-CM | POA: Diagnosis not present

## 2019-08-06 DIAGNOSIS — N186 End stage renal disease: Secondary | ICD-10-CM | POA: Diagnosis not present

## 2019-08-08 DIAGNOSIS — D509 Iron deficiency anemia, unspecified: Secondary | ICD-10-CM | POA: Diagnosis not present

## 2019-08-08 DIAGNOSIS — N2581 Secondary hyperparathyroidism of renal origin: Secondary | ICD-10-CM | POA: Diagnosis not present

## 2019-08-08 DIAGNOSIS — E1129 Type 2 diabetes mellitus with other diabetic kidney complication: Secondary | ICD-10-CM | POA: Diagnosis not present

## 2019-08-08 DIAGNOSIS — Z992 Dependence on renal dialysis: Secondary | ICD-10-CM | POA: Diagnosis not present

## 2019-08-08 DIAGNOSIS — N186 End stage renal disease: Secondary | ICD-10-CM | POA: Diagnosis not present

## 2019-08-08 DIAGNOSIS — Z23 Encounter for immunization: Secondary | ICD-10-CM | POA: Diagnosis not present

## 2019-08-11 DIAGNOSIS — Z992 Dependence on renal dialysis: Secondary | ICD-10-CM | POA: Diagnosis not present

## 2019-08-11 DIAGNOSIS — Z23 Encounter for immunization: Secondary | ICD-10-CM | POA: Diagnosis not present

## 2019-08-11 DIAGNOSIS — E1129 Type 2 diabetes mellitus with other diabetic kidney complication: Secondary | ICD-10-CM | POA: Diagnosis not present

## 2019-08-11 DIAGNOSIS — N2581 Secondary hyperparathyroidism of renal origin: Secondary | ICD-10-CM | POA: Diagnosis not present

## 2019-08-11 DIAGNOSIS — N186 End stage renal disease: Secondary | ICD-10-CM | POA: Diagnosis not present

## 2019-08-11 DIAGNOSIS — D509 Iron deficiency anemia, unspecified: Secondary | ICD-10-CM | POA: Diagnosis not present

## 2019-08-13 DIAGNOSIS — N186 End stage renal disease: Secondary | ICD-10-CM | POA: Diagnosis not present

## 2019-08-13 DIAGNOSIS — Z23 Encounter for immunization: Secondary | ICD-10-CM | POA: Diagnosis not present

## 2019-08-13 DIAGNOSIS — D509 Iron deficiency anemia, unspecified: Secondary | ICD-10-CM | POA: Diagnosis not present

## 2019-08-13 DIAGNOSIS — Z992 Dependence on renal dialysis: Secondary | ICD-10-CM | POA: Diagnosis not present

## 2019-08-13 DIAGNOSIS — N2581 Secondary hyperparathyroidism of renal origin: Secondary | ICD-10-CM | POA: Diagnosis not present

## 2019-08-13 DIAGNOSIS — E1129 Type 2 diabetes mellitus with other diabetic kidney complication: Secondary | ICD-10-CM | POA: Diagnosis not present

## 2019-08-15 DIAGNOSIS — Z23 Encounter for immunization: Secondary | ICD-10-CM | POA: Diagnosis not present

## 2019-08-15 DIAGNOSIS — Z992 Dependence on renal dialysis: Secondary | ICD-10-CM | POA: Diagnosis not present

## 2019-08-15 DIAGNOSIS — D509 Iron deficiency anemia, unspecified: Secondary | ICD-10-CM | POA: Diagnosis not present

## 2019-08-15 DIAGNOSIS — N2581 Secondary hyperparathyroidism of renal origin: Secondary | ICD-10-CM | POA: Diagnosis not present

## 2019-08-15 DIAGNOSIS — E1129 Type 2 diabetes mellitus with other diabetic kidney complication: Secondary | ICD-10-CM | POA: Diagnosis not present

## 2019-08-15 DIAGNOSIS — N186 End stage renal disease: Secondary | ICD-10-CM | POA: Diagnosis not present

## 2019-08-18 DIAGNOSIS — Z23 Encounter for immunization: Secondary | ICD-10-CM | POA: Diagnosis not present

## 2019-08-18 DIAGNOSIS — Z992 Dependence on renal dialysis: Secondary | ICD-10-CM | POA: Diagnosis not present

## 2019-08-18 DIAGNOSIS — N186 End stage renal disease: Secondary | ICD-10-CM | POA: Diagnosis not present

## 2019-08-18 DIAGNOSIS — N2581 Secondary hyperparathyroidism of renal origin: Secondary | ICD-10-CM | POA: Diagnosis not present

## 2019-08-18 DIAGNOSIS — E1129 Type 2 diabetes mellitus with other diabetic kidney complication: Secondary | ICD-10-CM | POA: Diagnosis not present

## 2019-08-18 DIAGNOSIS — D509 Iron deficiency anemia, unspecified: Secondary | ICD-10-CM | POA: Diagnosis not present

## 2019-08-20 DIAGNOSIS — D509 Iron deficiency anemia, unspecified: Secondary | ICD-10-CM | POA: Diagnosis not present

## 2019-08-20 DIAGNOSIS — Z992 Dependence on renal dialysis: Secondary | ICD-10-CM | POA: Diagnosis not present

## 2019-08-20 DIAGNOSIS — E1129 Type 2 diabetes mellitus with other diabetic kidney complication: Secondary | ICD-10-CM | POA: Diagnosis not present

## 2019-08-20 DIAGNOSIS — N2581 Secondary hyperparathyroidism of renal origin: Secondary | ICD-10-CM | POA: Diagnosis not present

## 2019-08-20 DIAGNOSIS — N186 End stage renal disease: Secondary | ICD-10-CM | POA: Diagnosis not present

## 2019-08-20 DIAGNOSIS — Z23 Encounter for immunization: Secondary | ICD-10-CM | POA: Diagnosis not present

## 2019-08-22 DIAGNOSIS — Z992 Dependence on renal dialysis: Secondary | ICD-10-CM | POA: Diagnosis not present

## 2019-08-22 DIAGNOSIS — E1129 Type 2 diabetes mellitus with other diabetic kidney complication: Secondary | ICD-10-CM | POA: Diagnosis not present

## 2019-08-22 DIAGNOSIS — N186 End stage renal disease: Secondary | ICD-10-CM | POA: Diagnosis not present

## 2019-08-22 DIAGNOSIS — Z23 Encounter for immunization: Secondary | ICD-10-CM | POA: Diagnosis not present

## 2019-08-22 DIAGNOSIS — D509 Iron deficiency anemia, unspecified: Secondary | ICD-10-CM | POA: Diagnosis not present

## 2019-08-22 DIAGNOSIS — N2581 Secondary hyperparathyroidism of renal origin: Secondary | ICD-10-CM | POA: Diagnosis not present

## 2019-08-25 DIAGNOSIS — E1129 Type 2 diabetes mellitus with other diabetic kidney complication: Secondary | ICD-10-CM | POA: Diagnosis not present

## 2019-08-25 DIAGNOSIS — N186 End stage renal disease: Secondary | ICD-10-CM | POA: Diagnosis not present

## 2019-08-25 DIAGNOSIS — D509 Iron deficiency anemia, unspecified: Secondary | ICD-10-CM | POA: Diagnosis not present

## 2019-08-25 DIAGNOSIS — N2581 Secondary hyperparathyroidism of renal origin: Secondary | ICD-10-CM | POA: Diagnosis not present

## 2019-08-25 DIAGNOSIS — Z23 Encounter for immunization: Secondary | ICD-10-CM | POA: Diagnosis not present

## 2019-08-25 DIAGNOSIS — Z992 Dependence on renal dialysis: Secondary | ICD-10-CM | POA: Diagnosis not present

## 2019-08-27 DIAGNOSIS — D509 Iron deficiency anemia, unspecified: Secondary | ICD-10-CM | POA: Diagnosis not present

## 2019-08-27 DIAGNOSIS — Z23 Encounter for immunization: Secondary | ICD-10-CM | POA: Diagnosis not present

## 2019-08-27 DIAGNOSIS — N186 End stage renal disease: Secondary | ICD-10-CM | POA: Diagnosis not present

## 2019-08-27 DIAGNOSIS — N2581 Secondary hyperparathyroidism of renal origin: Secondary | ICD-10-CM | POA: Diagnosis not present

## 2019-08-27 DIAGNOSIS — E1129 Type 2 diabetes mellitus with other diabetic kidney complication: Secondary | ICD-10-CM | POA: Diagnosis not present

## 2019-08-27 DIAGNOSIS — Z992 Dependence on renal dialysis: Secondary | ICD-10-CM | POA: Diagnosis not present

## 2019-08-29 DIAGNOSIS — E1129 Type 2 diabetes mellitus with other diabetic kidney complication: Secondary | ICD-10-CM | POA: Diagnosis not present

## 2019-08-29 DIAGNOSIS — N2581 Secondary hyperparathyroidism of renal origin: Secondary | ICD-10-CM | POA: Diagnosis not present

## 2019-08-29 DIAGNOSIS — N186 End stage renal disease: Secondary | ICD-10-CM | POA: Diagnosis not present

## 2019-08-29 DIAGNOSIS — D509 Iron deficiency anemia, unspecified: Secondary | ICD-10-CM | POA: Diagnosis not present

## 2019-08-29 DIAGNOSIS — Z23 Encounter for immunization: Secondary | ICD-10-CM | POA: Diagnosis not present

## 2019-08-29 DIAGNOSIS — Z992 Dependence on renal dialysis: Secondary | ICD-10-CM | POA: Diagnosis not present

## 2019-09-01 DIAGNOSIS — E1129 Type 2 diabetes mellitus with other diabetic kidney complication: Secondary | ICD-10-CM | POA: Diagnosis not present

## 2019-09-01 DIAGNOSIS — N2581 Secondary hyperparathyroidism of renal origin: Secondary | ICD-10-CM | POA: Diagnosis not present

## 2019-09-01 DIAGNOSIS — N186 End stage renal disease: Secondary | ICD-10-CM | POA: Diagnosis not present

## 2019-09-01 DIAGNOSIS — Z23 Encounter for immunization: Secondary | ICD-10-CM | POA: Diagnosis not present

## 2019-09-01 DIAGNOSIS — Z992 Dependence on renal dialysis: Secondary | ICD-10-CM | POA: Diagnosis not present

## 2019-09-01 DIAGNOSIS — D509 Iron deficiency anemia, unspecified: Secondary | ICD-10-CM | POA: Diagnosis not present

## 2019-09-03 DIAGNOSIS — E1129 Type 2 diabetes mellitus with other diabetic kidney complication: Secondary | ICD-10-CM | POA: Diagnosis not present

## 2019-09-03 DIAGNOSIS — N040 Nephrotic syndrome with minor glomerular abnormality: Secondary | ICD-10-CM | POA: Diagnosis not present

## 2019-09-03 DIAGNOSIS — N186 End stage renal disease: Secondary | ICD-10-CM | POA: Diagnosis not present

## 2019-09-03 DIAGNOSIS — Z992 Dependence on renal dialysis: Secondary | ICD-10-CM | POA: Diagnosis not present

## 2019-09-03 DIAGNOSIS — D509 Iron deficiency anemia, unspecified: Secondary | ICD-10-CM | POA: Diagnosis not present

## 2019-09-03 DIAGNOSIS — Z23 Encounter for immunization: Secondary | ICD-10-CM | POA: Diagnosis not present

## 2019-09-03 DIAGNOSIS — N2581 Secondary hyperparathyroidism of renal origin: Secondary | ICD-10-CM | POA: Diagnosis not present

## 2019-09-08 DIAGNOSIS — E1129 Type 2 diabetes mellitus with other diabetic kidney complication: Secondary | ICD-10-CM | POA: Diagnosis not present

## 2019-09-08 DIAGNOSIS — D631 Anemia in chronic kidney disease: Secondary | ICD-10-CM | POA: Diagnosis not present

## 2019-09-08 DIAGNOSIS — N2581 Secondary hyperparathyroidism of renal origin: Secondary | ICD-10-CM | POA: Diagnosis not present

## 2019-09-08 DIAGNOSIS — D509 Iron deficiency anemia, unspecified: Secondary | ICD-10-CM | POA: Diagnosis not present

## 2019-09-08 DIAGNOSIS — Z992 Dependence on renal dialysis: Secondary | ICD-10-CM | POA: Diagnosis not present

## 2019-09-08 DIAGNOSIS — N186 End stage renal disease: Secondary | ICD-10-CM | POA: Diagnosis not present

## 2019-09-10 DIAGNOSIS — D631 Anemia in chronic kidney disease: Secondary | ICD-10-CM | POA: Diagnosis not present

## 2019-09-10 DIAGNOSIS — N2581 Secondary hyperparathyroidism of renal origin: Secondary | ICD-10-CM | POA: Diagnosis not present

## 2019-09-10 DIAGNOSIS — N186 End stage renal disease: Secondary | ICD-10-CM | POA: Diagnosis not present

## 2019-09-10 DIAGNOSIS — D509 Iron deficiency anemia, unspecified: Secondary | ICD-10-CM | POA: Diagnosis not present

## 2019-09-10 DIAGNOSIS — E1129 Type 2 diabetes mellitus with other diabetic kidney complication: Secondary | ICD-10-CM | POA: Diagnosis not present

## 2019-09-10 DIAGNOSIS — Z992 Dependence on renal dialysis: Secondary | ICD-10-CM | POA: Diagnosis not present

## 2019-09-12 DIAGNOSIS — N186 End stage renal disease: Secondary | ICD-10-CM | POA: Diagnosis not present

## 2019-09-12 DIAGNOSIS — E1129 Type 2 diabetes mellitus with other diabetic kidney complication: Secondary | ICD-10-CM | POA: Diagnosis not present

## 2019-09-12 DIAGNOSIS — Z992 Dependence on renal dialysis: Secondary | ICD-10-CM | POA: Diagnosis not present

## 2019-09-12 DIAGNOSIS — D631 Anemia in chronic kidney disease: Secondary | ICD-10-CM | POA: Diagnosis not present

## 2019-09-12 DIAGNOSIS — N2581 Secondary hyperparathyroidism of renal origin: Secondary | ICD-10-CM | POA: Diagnosis not present

## 2019-09-12 DIAGNOSIS — D509 Iron deficiency anemia, unspecified: Secondary | ICD-10-CM | POA: Diagnosis not present

## 2019-09-17 DIAGNOSIS — N186 End stage renal disease: Secondary | ICD-10-CM | POA: Diagnosis not present

## 2019-09-17 DIAGNOSIS — D631 Anemia in chronic kidney disease: Secondary | ICD-10-CM | POA: Diagnosis not present

## 2019-09-17 DIAGNOSIS — N2581 Secondary hyperparathyroidism of renal origin: Secondary | ICD-10-CM | POA: Diagnosis not present

## 2019-09-17 DIAGNOSIS — Z992 Dependence on renal dialysis: Secondary | ICD-10-CM | POA: Diagnosis not present

## 2019-09-17 DIAGNOSIS — E1129 Type 2 diabetes mellitus with other diabetic kidney complication: Secondary | ICD-10-CM | POA: Diagnosis not present

## 2019-09-17 DIAGNOSIS — D509 Iron deficiency anemia, unspecified: Secondary | ICD-10-CM | POA: Diagnosis not present

## 2019-09-19 ENCOUNTER — Encounter: Payer: Self-pay | Admitting: Nurse Practitioner

## 2019-09-19 ENCOUNTER — Ambulatory Visit (INDEPENDENT_AMBULATORY_CARE_PROVIDER_SITE_OTHER): Payer: Medicare Other | Admitting: Nurse Practitioner

## 2019-09-19 ENCOUNTER — Other Ambulatory Visit: Payer: Self-pay

## 2019-09-19 DIAGNOSIS — N2581 Secondary hyperparathyroidism of renal origin: Secondary | ICD-10-CM | POA: Diagnosis not present

## 2019-09-19 DIAGNOSIS — I10 Essential (primary) hypertension: Secondary | ICD-10-CM

## 2019-09-19 DIAGNOSIS — D631 Anemia in chronic kidney disease: Secondary | ICD-10-CM

## 2019-09-19 DIAGNOSIS — N186 End stage renal disease: Secondary | ICD-10-CM

## 2019-09-19 DIAGNOSIS — N184 Chronic kidney disease, stage 4 (severe): Secondary | ICD-10-CM | POA: Diagnosis not present

## 2019-09-19 DIAGNOSIS — D509 Iron deficiency anemia, unspecified: Secondary | ICD-10-CM | POA: Diagnosis not present

## 2019-09-19 DIAGNOSIS — Z992 Dependence on renal dialysis: Secondary | ICD-10-CM | POA: Diagnosis not present

## 2019-09-19 DIAGNOSIS — E1129 Type 2 diabetes mellitus with other diabetic kidney complication: Secondary | ICD-10-CM | POA: Diagnosis not present

## 2019-09-19 NOTE — Progress Notes (Signed)
[facility] [place], [place] [address]  Internal MEDICINE  Telephone Visit  Patient Name: Kristen Francis  [id number]  [id number]  Date of Service: 10/01/2019  I connected with the patient at 3:39pm by telephone and verified the patients identity using two identifiers.   I discussed the limitations, risks, security and privacy concerns of performing an evaluation and management service by telephone and the availability of in person appointments. I also discussed with the patient that there may be a patient responsible charge related to the service.  The patient expressed understanding and agrees to proceed.    Chief Complaint  Patient presents with  . Telephone Assessment  . Telephone Screen  . Hypertension  . Hyperlipidemia    The patient has been contacted via telephone for follow up visit due to concerns for spread of novel coronavirus. The patient states that she is not feeling so well today. On days when she has dialysis, she feels nauseated and has trouble eating. She states that she does get some medication through IV to help symptoms while at dialysis, however, it is not working as well as it used to. She states she feels great on days when she is not at dialysis. She has no other concerns or complaints today. She did get her flu shot about three weeks ago at the kidney center. She is due for annual wellness exam and needs to have screening mammogram. These will be scheduled in the near future.       Current Medication: Outpatient Encounter Medications as of 09/19/2019  Medication Sig  . allopurinol (ZYLOPRIM) 100 MG tablet Take 1 tablet (100 mg total) by mouth daily.  Marland Kitchen b complex-vitamin c-folic acid (NEPHRO-VITE) 0.8 MG TABS tablet TAKE 1 TABLET BY MOUTH EVERY DAY (ON DIALYSIS DAYS, TAKE AFTER DIALYSIS TREATMENT)  . carvedilol (COREG) 25 MG tablet Take 25 mg by mouth 2 (two) times daily with a meal.  . cholecalciferol (VITAMIN D) 1000 units  tablet Take 1,000 Units by mouth daily.   Marland Kitchen doxycycline (VIBRAMYCIN) 100 MG capsule Take 1 capsule (100 mg total) by mouth 2 (two) times daily.  . ferrous sulfate 325 (65 FE) MG tablet Take 325 mg by mouth daily.   Marland Kitchen lidocaine-prilocaine (EMLA) cream APPLY SMALL AMOUNT TO ACCESS SITE (AVF) 3 TIMES A WEEK 1 HOUR BEFORE DIALYSIS. COVER WITH OCCLUSIVE DRESSING (SARAN WRAP)  . losartan (COZAAR) 50 MG tablet Take 50 mg by mouth daily.  Marland Kitchen torsemide (DEMADEX) 5 MG tablet Take 5 mg by mouth daily.   . [DISCONTINUED] amLODipine (NORVASC) 10 MG tablet Take 1 tablet (10 mg total) by mouth daily.   No facility-administered encounter medications on file as of 09/19/2019.     Surgical History: Past Surgical History:  Procedure Laterality Date  . A/V FISTULAGRAM Left 12/25/2018   Procedure: A/V FISTULAGRAM;  Surgeon: Katha Cabal, MD;  Location: [facility];  Service: Cardiovascular;  Laterality: Left;  . ABDOMINAL HYSTERECTOMY    . APPENDECTOMY    . AV FISTULA PLACEMENT Left 07/12/2018   Procedure: INSERTION OF ARTERIOVENOUS (AV) GORE-TEX GRAFT ARM ( BRACHIAL AXILLARY );  Surgeon: Katha Cabal, MD;  Location: [facility] ORS;  Service: Vascular;  Laterality: Left;  . COLONOSCOPY WITH PROPOFOL N/A 01/14/2016   Procedure: COLONOSCOPY WITH PROPOFOL;  Surgeon: Manya Silvas, MD;  Location: [facility] ENDOSCOPY;  Service: Endoscopy;  Laterality: N/A;  . TONSILLECTOMY      Medical History: Past Medical History:  Diagnosis Date  .  Anemia   . Arthritis   . Chronic kidney disease    ESRD  . Complication of anesthesia    had procedure and felt incision early 80s  . Gout   . Hyperlipemia   . Hypertension   . Macular degeneration, right eye   . Stroke [facility])     Family History: Family History  Problem Relation Age of Onset  . Cancer Mother     Social History   Socioeconomic History  . Marital status: Married    Spouse name: Not on file  . Number of children: Not on file  . Years of  education: Not on file  . Highest education level: Not on file  Occupational History  . Not on file  Social Needs  . Financial resource strain: Not on file  . Food insecurity    Worry: Not on file    Inability: Not on file  . Transportation needs    Medical: Not on file    Non-medical: Not on file  Tobacco Use  . Smoking status: Former Research scientist (life sciences)  . Smokeless tobacco: Never Used  Substance and Sexual Activity  . Alcohol use: No    Frequency: Never  . Drug use: No  . Sexual activity: Not on file  Lifestyle  . Physical activity    Days per week: Not on file    Minutes per session: Not on file  . Stress: Not on file  Relationships  . Social Herbalist on phone: Not on file    Gets together: Not on file    Attends religious service: Not on file    Active member of club or organization: Not on file    Attends meetings of clubs or organizations: Not on file    Relationship status: Not on file  . Intimate partner violence    Fear of current or ex partner: Not on file    Emotionally abused: Not on file    Physically abused: Not on file    Forced sexual activity: Not on file  Other Topics Concern  . Not on file  Social History Narrative  . Not on file      Review of Systems  Vital Signs: There were no vitals taken for this visit.   Observation/Objective:   The patient is alert and oriented. She is pleasant and answers all questions appropriately. Breathing is non-labored. She is in no acute distress at this time. She sounds fatigued on the phone.   Assessment/Plan: 1. End stage renal disease ([place]) Hemodialysis treatments as scheduled.  2. Essential hypertension Stable. Continue bp medication as prescribed   3. Anemia in stage 4 chronic kidney disease (HCC) Continue iron supplementation as prescribed   General Counseling: Kristen Francis verbalizes understanding of the findings of today's phone visit and agrees with plan of treatment. I have discussed any further  diagnostic evaluation that may be needed or ordered today. We also reviewed her medications today. she has been encouraged to call the office with any questions or concerns that should arise related to todays visit.  This patient was seen by Leretha Pol FNP Collaboration with Dr Lavera Guise as a part of collaborative care agreement   Time spent: 25 Minutes    Dr Lavera Guise Internal medicine

## 2019-09-22 DIAGNOSIS — Z992 Dependence on renal dialysis: Secondary | ICD-10-CM | POA: Diagnosis not present

## 2019-09-22 DIAGNOSIS — D631 Anemia in chronic kidney disease: Secondary | ICD-10-CM | POA: Diagnosis not present

## 2019-09-22 DIAGNOSIS — N2581 Secondary hyperparathyroidism of renal origin: Secondary | ICD-10-CM | POA: Diagnosis not present

## 2019-09-22 DIAGNOSIS — E1129 Type 2 diabetes mellitus with other diabetic kidney complication: Secondary | ICD-10-CM | POA: Diagnosis not present

## 2019-09-22 DIAGNOSIS — D509 Iron deficiency anemia, unspecified: Secondary | ICD-10-CM | POA: Diagnosis not present

## 2019-09-22 DIAGNOSIS — N186 End stage renal disease: Secondary | ICD-10-CM | POA: Diagnosis not present

## 2019-09-24 ENCOUNTER — Other Ambulatory Visit: Payer: Self-pay

## 2019-09-24 DIAGNOSIS — Z992 Dependence on renal dialysis: Secondary | ICD-10-CM | POA: Diagnosis not present

## 2019-09-24 DIAGNOSIS — D631 Anemia in chronic kidney disease: Secondary | ICD-10-CM | POA: Diagnosis not present

## 2019-09-24 DIAGNOSIS — N2581 Secondary hyperparathyroidism of renal origin: Secondary | ICD-10-CM | POA: Diagnosis not present

## 2019-09-24 DIAGNOSIS — E1129 Type 2 diabetes mellitus with other diabetic kidney complication: Secondary | ICD-10-CM | POA: Diagnosis not present

## 2019-09-24 DIAGNOSIS — N186 End stage renal disease: Secondary | ICD-10-CM | POA: Diagnosis not present

## 2019-09-24 DIAGNOSIS — D509 Iron deficiency anemia, unspecified: Secondary | ICD-10-CM | POA: Diagnosis not present

## 2019-09-24 MED ORDER — AMLODIPINE BESYLATE 10 MG PO TABS: 10.0000 mg | ORAL_TABLET | Freq: Every day | ORAL | 1 refills | Status: DC

## 2019-09-26 DIAGNOSIS — E1129 Type 2 diabetes mellitus with other diabetic kidney complication: Secondary | ICD-10-CM | POA: Diagnosis not present

## 2019-09-26 DIAGNOSIS — D631 Anemia in chronic kidney disease: Secondary | ICD-10-CM | POA: Diagnosis not present

## 2019-09-26 DIAGNOSIS — N186 End stage renal disease: Secondary | ICD-10-CM | POA: Diagnosis not present

## 2019-09-26 DIAGNOSIS — D509 Iron deficiency anemia, unspecified: Secondary | ICD-10-CM | POA: Diagnosis not present

## 2019-09-26 DIAGNOSIS — N2581 Secondary hyperparathyroidism of renal origin: Secondary | ICD-10-CM | POA: Diagnosis not present

## 2019-09-26 DIAGNOSIS — Z992 Dependence on renal dialysis: Secondary | ICD-10-CM | POA: Diagnosis not present

## 2019-09-29 DIAGNOSIS — D509 Iron deficiency anemia, unspecified: Secondary | ICD-10-CM | POA: Diagnosis not present

## 2019-09-29 DIAGNOSIS — E1129 Type 2 diabetes mellitus with other diabetic kidney complication: Secondary | ICD-10-CM | POA: Diagnosis not present

## 2019-09-29 DIAGNOSIS — D631 Anemia in chronic kidney disease: Secondary | ICD-10-CM | POA: Diagnosis not present

## 2019-09-29 DIAGNOSIS — Z992 Dependence on renal dialysis: Secondary | ICD-10-CM | POA: Diagnosis not present

## 2019-09-29 DIAGNOSIS — N186 End stage renal disease: Secondary | ICD-10-CM | POA: Diagnosis not present

## 2019-09-29 DIAGNOSIS — N2581 Secondary hyperparathyroidism of renal origin: Secondary | ICD-10-CM | POA: Diagnosis not present

## 2019-10-01 DIAGNOSIS — D631 Anemia in chronic kidney disease: Secondary | ICD-10-CM | POA: Diagnosis not present

## 2019-10-01 DIAGNOSIS — N186 End stage renal disease: Secondary | ICD-10-CM | POA: Diagnosis not present

## 2019-10-01 DIAGNOSIS — D509 Iron deficiency anemia, unspecified: Secondary | ICD-10-CM | POA: Diagnosis not present

## 2019-10-01 DIAGNOSIS — Z992 Dependence on renal dialysis: Secondary | ICD-10-CM | POA: Diagnosis not present

## 2019-10-01 DIAGNOSIS — N2581 Secondary hyperparathyroidism of renal origin: Secondary | ICD-10-CM | POA: Diagnosis not present

## 2019-10-01 DIAGNOSIS — E1129 Type 2 diabetes mellitus with other diabetic kidney complication: Secondary | ICD-10-CM | POA: Diagnosis not present

## 2019-10-03 DIAGNOSIS — D631 Anemia in chronic kidney disease: Secondary | ICD-10-CM | POA: Diagnosis not present

## 2019-10-03 DIAGNOSIS — D509 Iron deficiency anemia, unspecified: Secondary | ICD-10-CM | POA: Diagnosis not present

## 2019-10-03 DIAGNOSIS — E1129 Type 2 diabetes mellitus with other diabetic kidney complication: Secondary | ICD-10-CM | POA: Diagnosis not present

## 2019-10-03 DIAGNOSIS — N186 End stage renal disease: Secondary | ICD-10-CM | POA: Diagnosis not present

## 2019-10-03 DIAGNOSIS — N2581 Secondary hyperparathyroidism of renal origin: Secondary | ICD-10-CM | POA: Diagnosis not present

## 2019-10-03 DIAGNOSIS — Z992 Dependence on renal dialysis: Secondary | ICD-10-CM | POA: Diagnosis not present

## 2019-10-04 DIAGNOSIS — N186 End stage renal disease: Secondary | ICD-10-CM | POA: Diagnosis not present

## 2019-10-04 DIAGNOSIS — N040 Nephrotic syndrome with minor glomerular abnormality: Secondary | ICD-10-CM | POA: Diagnosis not present

## 2019-10-04 DIAGNOSIS — Z992 Dependence on renal dialysis: Secondary | ICD-10-CM | POA: Diagnosis not present

## 2019-10-06 DIAGNOSIS — N2581 Secondary hyperparathyroidism of renal origin: Secondary | ICD-10-CM | POA: Diagnosis not present

## 2019-10-06 DIAGNOSIS — Z992 Dependence on renal dialysis: Secondary | ICD-10-CM | POA: Diagnosis not present

## 2019-10-06 DIAGNOSIS — D509 Iron deficiency anemia, unspecified: Secondary | ICD-10-CM | POA: Diagnosis not present

## 2019-10-06 DIAGNOSIS — N186 End stage renal disease: Secondary | ICD-10-CM | POA: Diagnosis not present

## 2019-10-06 DIAGNOSIS — D631 Anemia in chronic kidney disease: Secondary | ICD-10-CM | POA: Diagnosis not present

## 2019-10-08 DIAGNOSIS — D509 Iron deficiency anemia, unspecified: Secondary | ICD-10-CM | POA: Diagnosis not present

## 2019-10-08 DIAGNOSIS — N186 End stage renal disease: Secondary | ICD-10-CM | POA: Diagnosis not present

## 2019-10-08 DIAGNOSIS — Z992 Dependence on renal dialysis: Secondary | ICD-10-CM | POA: Diagnosis not present

## 2019-10-08 DIAGNOSIS — D631 Anemia in chronic kidney disease: Secondary | ICD-10-CM | POA: Diagnosis not present

## 2019-10-08 DIAGNOSIS — N2581 Secondary hyperparathyroidism of renal origin: Secondary | ICD-10-CM | POA: Diagnosis not present

## 2019-10-09 ENCOUNTER — Other Ambulatory Visit: Payer: Self-pay

## 2019-10-10 DIAGNOSIS — Z992 Dependence on renal dialysis: Secondary | ICD-10-CM | POA: Diagnosis not present

## 2019-10-10 DIAGNOSIS — D509 Iron deficiency anemia, unspecified: Secondary | ICD-10-CM | POA: Diagnosis not present

## 2019-10-10 DIAGNOSIS — N186 End stage renal disease: Secondary | ICD-10-CM | POA: Diagnosis not present

## 2019-10-10 DIAGNOSIS — N2581 Secondary hyperparathyroidism of renal origin: Secondary | ICD-10-CM | POA: Diagnosis not present

## 2019-10-10 DIAGNOSIS — D631 Anemia in chronic kidney disease: Secondary | ICD-10-CM | POA: Diagnosis not present

## 2019-10-13 DIAGNOSIS — Z992 Dependence on renal dialysis: Secondary | ICD-10-CM | POA: Diagnosis not present

## 2019-10-13 DIAGNOSIS — D631 Anemia in chronic kidney disease: Secondary | ICD-10-CM | POA: Diagnosis not present

## 2019-10-13 DIAGNOSIS — N186 End stage renal disease: Secondary | ICD-10-CM | POA: Diagnosis not present

## 2019-10-13 DIAGNOSIS — D509 Iron deficiency anemia, unspecified: Secondary | ICD-10-CM | POA: Diagnosis not present

## 2019-10-13 DIAGNOSIS — N2581 Secondary hyperparathyroidism of renal origin: Secondary | ICD-10-CM | POA: Diagnosis not present

## 2019-10-15 DIAGNOSIS — D509 Iron deficiency anemia, unspecified: Secondary | ICD-10-CM | POA: Diagnosis not present

## 2019-10-15 DIAGNOSIS — N2581 Secondary hyperparathyroidism of renal origin: Secondary | ICD-10-CM | POA: Diagnosis not present

## 2019-10-15 DIAGNOSIS — N186 End stage renal disease: Secondary | ICD-10-CM | POA: Diagnosis not present

## 2019-10-15 DIAGNOSIS — D631 Anemia in chronic kidney disease: Secondary | ICD-10-CM | POA: Diagnosis not present

## 2019-10-15 DIAGNOSIS — Z992 Dependence on renal dialysis: Secondary | ICD-10-CM | POA: Diagnosis not present

## 2019-10-17 DIAGNOSIS — N2581 Secondary hyperparathyroidism of renal origin: Secondary | ICD-10-CM | POA: Diagnosis not present

## 2019-10-17 DIAGNOSIS — Z992 Dependence on renal dialysis: Secondary | ICD-10-CM | POA: Diagnosis not present

## 2019-10-17 DIAGNOSIS — N186 End stage renal disease: Secondary | ICD-10-CM | POA: Diagnosis not present

## 2019-10-17 DIAGNOSIS — D631 Anemia in chronic kidney disease: Secondary | ICD-10-CM | POA: Diagnosis not present

## 2019-10-17 DIAGNOSIS — D509 Iron deficiency anemia, unspecified: Secondary | ICD-10-CM | POA: Diagnosis not present

## 2019-10-20 DIAGNOSIS — N186 End stage renal disease: Secondary | ICD-10-CM | POA: Diagnosis not present

## 2019-10-20 DIAGNOSIS — Z992 Dependence on renal dialysis: Secondary | ICD-10-CM | POA: Diagnosis not present

## 2019-10-20 DIAGNOSIS — D631 Anemia in chronic kidney disease: Secondary | ICD-10-CM | POA: Diagnosis not present

## 2019-10-20 DIAGNOSIS — D509 Iron deficiency anemia, unspecified: Secondary | ICD-10-CM | POA: Diagnosis not present

## 2019-10-20 DIAGNOSIS — N2581 Secondary hyperparathyroidism of renal origin: Secondary | ICD-10-CM | POA: Diagnosis not present

## 2019-10-22 DIAGNOSIS — N186 End stage renal disease: Secondary | ICD-10-CM | POA: Diagnosis not present

## 2019-10-22 DIAGNOSIS — D509 Iron deficiency anemia, unspecified: Secondary | ICD-10-CM | POA: Diagnosis not present

## 2019-10-22 DIAGNOSIS — D631 Anemia in chronic kidney disease: Secondary | ICD-10-CM | POA: Diagnosis not present

## 2019-10-22 DIAGNOSIS — Z992 Dependence on renal dialysis: Secondary | ICD-10-CM | POA: Diagnosis not present

## 2019-10-22 DIAGNOSIS — N2581 Secondary hyperparathyroidism of renal origin: Secondary | ICD-10-CM | POA: Diagnosis not present

## 2019-10-24 DIAGNOSIS — Z992 Dependence on renal dialysis: Secondary | ICD-10-CM | POA: Diagnosis not present

## 2019-10-24 DIAGNOSIS — D631 Anemia in chronic kidney disease: Secondary | ICD-10-CM | POA: Diagnosis not present

## 2019-10-24 DIAGNOSIS — N2581 Secondary hyperparathyroidism of renal origin: Secondary | ICD-10-CM | POA: Diagnosis not present

## 2019-10-24 DIAGNOSIS — D509 Iron deficiency anemia, unspecified: Secondary | ICD-10-CM | POA: Diagnosis not present

## 2019-10-24 DIAGNOSIS — N186 End stage renal disease: Secondary | ICD-10-CM | POA: Diagnosis not present

## 2019-10-26 DIAGNOSIS — N186 End stage renal disease: Secondary | ICD-10-CM | POA: Diagnosis not present

## 2019-10-26 DIAGNOSIS — Z992 Dependence on renal dialysis: Secondary | ICD-10-CM | POA: Diagnosis not present

## 2019-10-26 DIAGNOSIS — D509 Iron deficiency anemia, unspecified: Secondary | ICD-10-CM | POA: Diagnosis not present

## 2019-10-26 DIAGNOSIS — N2581 Secondary hyperparathyroidism of renal origin: Secondary | ICD-10-CM | POA: Diagnosis not present

## 2019-10-26 DIAGNOSIS — D631 Anemia in chronic kidney disease: Secondary | ICD-10-CM | POA: Diagnosis not present

## 2019-10-29 DIAGNOSIS — D631 Anemia in chronic kidney disease: Secondary | ICD-10-CM | POA: Diagnosis not present

## 2019-10-29 DIAGNOSIS — N186 End stage renal disease: Secondary | ICD-10-CM | POA: Diagnosis not present

## 2019-10-29 DIAGNOSIS — N2581 Secondary hyperparathyroidism of renal origin: Secondary | ICD-10-CM | POA: Diagnosis not present

## 2019-10-29 DIAGNOSIS — D509 Iron deficiency anemia, unspecified: Secondary | ICD-10-CM | POA: Diagnosis not present

## 2019-10-29 DIAGNOSIS — Z992 Dependence on renal dialysis: Secondary | ICD-10-CM | POA: Diagnosis not present

## 2019-10-31 DIAGNOSIS — N2581 Secondary hyperparathyroidism of renal origin: Secondary | ICD-10-CM | POA: Diagnosis not present

## 2019-10-31 DIAGNOSIS — D631 Anemia in chronic kidney disease: Secondary | ICD-10-CM | POA: Diagnosis not present

## 2019-10-31 DIAGNOSIS — Z992 Dependence on renal dialysis: Secondary | ICD-10-CM | POA: Diagnosis not present

## 2019-10-31 DIAGNOSIS — D509 Iron deficiency anemia, unspecified: Secondary | ICD-10-CM | POA: Diagnosis not present

## 2019-10-31 DIAGNOSIS — N186 End stage renal disease: Secondary | ICD-10-CM | POA: Diagnosis not present

## 2019-11-03 DIAGNOSIS — N186 End stage renal disease: Secondary | ICD-10-CM | POA: Diagnosis not present

## 2019-11-03 DIAGNOSIS — D631 Anemia in chronic kidney disease: Secondary | ICD-10-CM | POA: Diagnosis not present

## 2019-11-03 DIAGNOSIS — N2581 Secondary hyperparathyroidism of renal origin: Secondary | ICD-10-CM | POA: Diagnosis not present

## 2019-11-03 DIAGNOSIS — D509 Iron deficiency anemia, unspecified: Secondary | ICD-10-CM | POA: Diagnosis not present

## 2019-11-03 DIAGNOSIS — Z992 Dependence on renal dialysis: Secondary | ICD-10-CM | POA: Diagnosis not present

## 2019-11-05 ENCOUNTER — Telehealth: Payer: Self-pay

## 2019-11-05 DIAGNOSIS — E1129 Type 2 diabetes mellitus with other diabetic kidney complication: Secondary | ICD-10-CM | POA: Diagnosis not present

## 2019-11-05 NOTE — Telephone Encounter (Signed)
Called confirmed appointment with patient will call back to reschedule or change to video. klh

## 2019-11-06 ENCOUNTER — Ambulatory Visit: Payer: Medicare Other | Admitting: Nurse Practitioner

## 2019-12-04 ENCOUNTER — Other Ambulatory Visit: Payer: Self-pay

## 2019-12-04 ENCOUNTER — Telehealth: Payer: Self-pay

## 2019-12-04 MED ORDER — AMLODIPINE BESYLATE 10 MG PO TABS: 10.0000 mg | ORAL_TABLET | Freq: Every day | ORAL | 1 refills | Status: DC

## 2019-12-04 NOTE — Telephone Encounter (Signed)
CONFIRMED AND SCREENED FOR 12-09-19 OV. 

## 2019-12-06 DIAGNOSIS — D509 Iron deficiency anemia, unspecified: Secondary | ICD-10-CM | POA: Diagnosis not present

## 2019-12-06 DIAGNOSIS — Z992 Dependence on renal dialysis: Secondary | ICD-10-CM | POA: Diagnosis not present

## 2019-12-06 DIAGNOSIS — E1129 Type 2 diabetes mellitus with other diabetic kidney complication: Secondary | ICD-10-CM | POA: Diagnosis not present

## 2019-12-06 DIAGNOSIS — D631 Anemia in chronic kidney disease: Secondary | ICD-10-CM | POA: Diagnosis not present

## 2019-12-06 DIAGNOSIS — N2581 Secondary hyperparathyroidism of renal origin: Secondary | ICD-10-CM | POA: Diagnosis not present

## 2019-12-06 DIAGNOSIS — N186 End stage renal disease: Secondary | ICD-10-CM | POA: Diagnosis not present

## 2019-12-06 DIAGNOSIS — D689 Coagulation defect, unspecified: Secondary | ICD-10-CM | POA: Diagnosis not present

## 2019-12-08 DIAGNOSIS — E1129 Type 2 diabetes mellitus with other diabetic kidney complication: Secondary | ICD-10-CM | POA: Diagnosis not present

## 2019-12-08 DIAGNOSIS — N186 End stage renal disease: Secondary | ICD-10-CM | POA: Diagnosis not present

## 2019-12-08 DIAGNOSIS — D631 Anemia in chronic kidney disease: Secondary | ICD-10-CM | POA: Diagnosis not present

## 2019-12-08 DIAGNOSIS — D689 Coagulation defect, unspecified: Secondary | ICD-10-CM | POA: Diagnosis not present

## 2019-12-08 DIAGNOSIS — D509 Iron deficiency anemia, unspecified: Secondary | ICD-10-CM | POA: Diagnosis not present

## 2019-12-08 DIAGNOSIS — Z992 Dependence on renal dialysis: Secondary | ICD-10-CM | POA: Diagnosis not present

## 2019-12-08 DIAGNOSIS — N2581 Secondary hyperparathyroidism of renal origin: Secondary | ICD-10-CM | POA: Diagnosis not present

## 2019-12-09 ENCOUNTER — Other Ambulatory Visit: Payer: Self-pay

## 2019-12-09 ENCOUNTER — Ambulatory Visit (INDEPENDENT_AMBULATORY_CARE_PROVIDER_SITE_OTHER): Payer: Medicare Other | Admitting: Nurse Practitioner

## 2019-12-09 ENCOUNTER — Encounter: Payer: Self-pay | Admitting: Nurse Practitioner

## 2019-12-09 ENCOUNTER — Encounter (INDEPENDENT_AMBULATORY_CARE_PROVIDER_SITE_OTHER): Payer: Self-pay

## 2019-12-09 VITALS — BP 141/66 | HR 80 | Temp 97.3°F | Resp 16 | Ht 66.0 in | Wt 136.0 lb

## 2019-12-09 DIAGNOSIS — I10 Essential (primary) hypertension: Secondary | ICD-10-CM | POA: Diagnosis not present

## 2019-12-09 DIAGNOSIS — N186 End stage renal disease: Secondary | ICD-10-CM | POA: Diagnosis not present

## 2019-12-09 DIAGNOSIS — Z1231 Encounter for screening mammogram for malignant neoplasm of breast: Secondary | ICD-10-CM | POA: Diagnosis not present

## 2019-12-09 DIAGNOSIS — R300 Dysuria: Secondary | ICD-10-CM

## 2019-12-09 DIAGNOSIS — Z0001 Encounter for general adult medical examination with abnormal findings: Secondary | ICD-10-CM | POA: Diagnosis not present

## 2019-12-09 NOTE — Progress Notes (Signed)
[facility] [place], [place] [address]  Internal MEDICINE  Office Visit Note  Patient Name: Kristen Francis  [id number]  [id number]  Date of Service: 12/13/2019  Chief Complaint  Patient presents with  . Medicare Wellness  . Hypertension  . Hyperlipidemia  . Arthritis  . Quality Metric Gaps    mammogram    Kristen Francis presents today for an annual wellness exam. She reports that she has unintentionally lost 20-30 lbs since beginning dialysis in October 2019. She reports that she was given a medication to take on dialysis days that caused nausea and vomiting. She reports that she has stopped taking it, but cannot remember the name of the medication. She states that her appetite is good on non-dialysis days, and that she is now able to eat lightly on dialysis days without getting sick. She also reports some feelings of sadness and anxiety related to her dialysis schedule of 3 times a week, and the related lifestyle changes. She is due to have routine, fasting labs. She is also due to have a screening mammogram.       Current Medication: Outpatient Encounter Medications as of 12/09/2019  Medication Sig  . allopurinol (ZYLOPRIM) 100 MG tablet Take 1 tablet (100 mg total) by mouth daily.  Marland Kitchen amLODipine (NORVASC) 10 MG tablet Take 1 tablet (10 mg total) by mouth daily.  Marland Kitchen b complex-vitamin c-folic acid (NEPHRO-VITE) 0.8 MG TABS tablet TAKE 1 TABLET BY MOUTH EVERY DAY (ON DIALYSIS DAYS, TAKE AFTER DIALYSIS TREATMENT)  . carvedilol (COREG) 25 MG tablet Take 25 mg by mouth 2 (two) times daily with a meal.  . cholecalciferol (VITAMIN D) 1000 units tablet Take 1,000 Units by mouth daily.   Marland Kitchen doxycycline (VIBRAMYCIN) 100 MG capsule Take 1 capsule (100 mg total) by mouth 2 (two) times daily.  . ferrous sulfate 325 (65 FE) MG tablet Take 325 mg by mouth daily.   Marland Kitchen lidocaine-prilocaine (EMLA) cream APPLY SMALL AMOUNT TO ACCESS SITE (AVF) 3 TIMES A WEEK 1 HOUR BEFORE  DIALYSIS. COVER WITH OCCLUSIVE DRESSING (SARAN WRAP)  . losartan (COZAAR) 50 MG tablet Take 50 mg by mouth daily.  Marland Kitchen torsemide (DEMADEX) 5 MG tablet Take 5 mg by mouth daily.    No facility-administered encounter medications on file as of 12/09/2019.    Surgical History: Past Surgical History:  Procedure Laterality Date  . A/V FISTULAGRAM Left 12/25/2018   Procedure: A/V FISTULAGRAM;  Surgeon: Katha Cabal, MD;  Location: [facility];  Service: Cardiovascular;  Laterality: Left;  . ABDOMINAL HYSTERECTOMY    . APPENDECTOMY    . AV FISTULA PLACEMENT Left 07/12/2018   Procedure: INSERTION OF ARTERIOVENOUS (AV) GORE-TEX GRAFT ARM ( BRACHIAL AXILLARY );  Surgeon: Katha Cabal, MD;  Location: [facility] ORS;  Service: Vascular;  Laterality: Left;  . COLONOSCOPY WITH PROPOFOL N/A 01/14/2016   Procedure: COLONOSCOPY WITH PROPOFOL;  Surgeon: Manya Silvas, MD;  Location: [facility] ENDOSCOPY;  Service: Endoscopy;  Laterality: N/A;  . TONSILLECTOMY      Medical History: Past Medical History:  Diagnosis Date  . Anemia   . Arthritis   . Chronic kidney disease    ESRD  . Complication of anesthesia    had procedure and felt incision early 80s  . Gout   . Hyperlipemia   . Hypertension   . Macular degeneration, right eye   . Stroke [facility])     Family History: Family History  Problem Relation Age of Onset  .  Cancer Mother     Social History   Socioeconomic History  . Marital status: Married    Spouse name: Not on file  . Number of children: Not on file  . Years of education: Not on file  . Highest education level: Not on file  Occupational History  . Not on file  Tobacco Use  . Smoking status: Former Research scientist (life sciences)  . Smokeless tobacco: Never Used  Substance and Sexual Activity  . Alcohol use: No  . Drug use: No  . Sexual activity: Not on file  Other Topics Concern  . Not on file  Social History Narrative  . Not on file   Social Determinants of Health   Financial  Resource Strain:   . Difficulty of Paying Living Expenses: Not on file  Food Insecurity:   . Worried About Charity fundraiser in the Last Year: Not on file  . Ran Out of Food in the Last Year: Not on file  Transportation Needs:   . Lack of Transportation (Medical): Not on file  . Lack of Transportation (Non-Medical): Not on file  Physical Activity:   . Days of Exercise per Week: Not on file  . Minutes of Exercise per Session: Not on file  Stress:   . Feeling of Stress : Not on file  Social Connections:   . Frequency of Communication with Friends and Family: Not on file  . Frequency of Social Gatherings with Friends and Family: Not on file  . Attends Religious Services: Not on file  . Active Member of Clubs or Organizations: Not on file  . Attends Archivist Meetings: Not on file  . Marital Status: Not on file  Intimate Partner Violence:   . Fear of Current or Ex-Partner: Not on file  . Emotionally Abused: Not on file  . Physically Abused: Not on file  . Sexually Abused: Not on file      Review of Systems  Constitutional: Positive for unexpected weight change. Negative for activity change, chills and fever.  HENT: Negative for congestion, rhinorrhea, sinus pressure, sinus pain and sore throat.   Eyes: Positive for visual disturbance.       Macular degeneration, decreased vision in right eye  Respiratory: Negative for cough, shortness of breath and wheezing.   Cardiovascular: Negative for chest pain and palpitations.  Gastrointestinal: Positive for nausea. Negative for abdominal pain, constipation, diarrhea and vomiting.  Endocrine: Negative for cold intolerance, heat intolerance, polydipsia and polyuria.  Genitourinary: Negative for difficulty urinating, dysuria, frequency and urgency.  Musculoskeletal:       Generalized aches and pains related to arthritis  Skin: Negative for rash and wound.  Allergic/Immunologic: Negative for environmental allergies.   Neurological: Negative for dizziness, light-headedness and headaches.  Psychiatric/Behavioral: Negative for sleep disturbance. The patient is nervous/anxious.        Anxiety related to dialysis and alteration in lifestyle    Today's Vitals   12/09/19 1521  BP: (!) 141/66  Pulse: 80  Resp: 16  Temp: (!) 97.3 F (36.3 C)  SpO2: 98%  Weight: 136 lb (61.7 kg)  Height: 5\' 6"  (1.676 m)   Body mass index is 21.95 kg/m.    Physical Exam Vitals and nursing note reviewed.  Constitutional:      General: She is not in acute distress.    Appearance: Normal appearance.  HENT:     Head: Normocephalic and atraumatic.     Right Ear: Tympanic membrane normal.     Left Ear:  There is impacted cerumen.     Nose: Nose normal.  Eyes:     Extraocular Movements: Extraocular movements intact.     Pupils: Pupils are equal, round, and reactive to light.  Neck:     Thyroid: No thyroid mass, thyromegaly or thyroid tenderness.     Vascular: No carotid bruit.     Trachea: Trachea normal.  Cardiovascular:     Rate and Rhythm: Normal rate.     Pulses: Normal pulses.     Heart sounds: Normal heart sounds.     Comments: Regular rhythm with intermittent ectopic beats per auscultation Pulmonary:     Effort: Pulmonary effort is normal.     Breath sounds: Normal breath sounds.  Chest:     Chest wall: No mass, lacerations or deformity.     Breasts: Tanner Score is 5.        Right: Normal.        Left: Normal.  Abdominal:     General: Bowel sounds are normal.     Palpations: Abdomen is soft.     Tenderness: There is no abdominal tenderness. There is no right CVA tenderness or left CVA tenderness.  Musculoskeletal:     Cervical back: Normal range of motion and neck supple.     Right lower leg: No edema.     Left lower leg: No edema.  Lymphadenopathy:     Upper Body:     Right upper body: No axillary or pectoral adenopathy.     Left upper body: No axillary or pectoral adenopathy.  Skin:     General: Skin is warm and dry.     Capillary Refill: Capillary refill takes 2 to 3 seconds.  Neurological:     Mental Status: She is alert and oriented to person, place, and time.     Deep Tendon Reflexes:     Reflex Scores:      Patellar reflexes are 1+ on the right side and 1+ on the left side. Psychiatric:        Attention and Perception: Attention and perception normal.        Mood and Affect: Affect normal. Mood is depressed.        Speech: Speech normal.        Behavior: Behavior normal. Behavior is cooperative.        Thought Content: Thought content normal.        Cognition and Memory: Cognition and memory normal.        Judgment: Judgment normal.    Depression screen [facility] 2/9 12/09/2019 09/19/2019 06/24/2018  Decreased Interest 0 0 0  Down, Depressed, Hopeless 0 0 0  PHQ - 2 Score 0 0 0    Functional Status Survey: Is the patient deaf or have difficulty hearing?: No Does the patient have difficulty seeing, even when wearing glasses/contacts?: Yes Does the patient have difficulty concentrating, remembering, or making decisions?: No Does the patient have difficulty walking or climbing stairs?: No Does the patient have difficulty dressing or bathing?: No Does the patient have difficulty doing errands alone such as visiting a doctor's office or shopping?: No  MMSE - Victor Exam 12/09/2019 06/24/2018  Orientation to time 5 5  Orientation to Place 5 5  Registration 3 3  Attention/ Calculation 5 5  Recall 3 3  Language- name 2 objects 2 2  Language- repeat 1 1  Language- follow 3 step command 3 3  Language- read & follow direction 1 1  Write a  sentence 1 1  Copy design 1 1  Total score 30 30    Fall Risk  12/09/2019 12/09/2019 09/19/2019 12/02/2018 06/24/2018  Falls in the past year? 0 0 0 0 No  Comment - - - - -   Assessment/Plan: 1. Encounter for general adult medical examination with abnormal findings Annual health maintenance exam today.   2. End stage renal  disease ([place]) Continue hemodialysis treatments as scheduled.   3. Essential hypertension Stable. Continue bp medication as prescribed   4. Encounter for screening mammogram for malignant neoplasm of breast - screening mammo; Future  5. Dysuria - UA/M w/rflx Culture, Routine  General Counseling: Francis verbalizes understanding of the findings of todays visit and agrees with plan of treatment. I have discussed any further diagnostic evaluation that may be needed or ordered today. We also reviewed her medications today. she has been encouraged to call the office with any questions or concerns that should arise related to todays visit.    Orders Placed This Encounter  Procedures  . Microscopic Examination  . screening mammo  . UA/M w/rflx Culture, Routine   Hypertension Counseling:   The following hypertensive lifestyle modification were recommended and discussed:  1. Limiting alcohol intake to less than 1 oz/day of ethanol:(24 oz of beer or 8 oz of wine or 2 oz of 100-proof whiskey). 2. Take baby ASA 81 mg daily. 3. Importance of regular aerobic exercise and losing weight. 4. Reduce dietary saturated fat and cholesterol intake for overall cardiovascular health. 5. Maintaining adequate dietary potassium, calcium, and magnesium intake. 6. Regular monitoring of the blood pressure. 7. Reduce sodium intake to less than 100 mmol/day (less than 2.3 gm of sodium or less than 6 gm of sodium choride)   This patient was seen by [place] with Dr Lavera Guise as a part of collaborative care agreement   Total Time spent:45 Minutes      Dr Lavera Guise Internal medicine

## 2019-12-10 DIAGNOSIS — E1129 Type 2 diabetes mellitus with other diabetic kidney complication: Secondary | ICD-10-CM | POA: Diagnosis not present

## 2019-12-10 DIAGNOSIS — D631 Anemia in chronic kidney disease: Secondary | ICD-10-CM | POA: Diagnosis not present

## 2019-12-10 DIAGNOSIS — D689 Coagulation defect, unspecified: Secondary | ICD-10-CM | POA: Diagnosis not present

## 2019-12-10 DIAGNOSIS — D509 Iron deficiency anemia, unspecified: Secondary | ICD-10-CM | POA: Diagnosis not present

## 2019-12-10 DIAGNOSIS — N186 End stage renal disease: Secondary | ICD-10-CM | POA: Diagnosis not present

## 2019-12-10 DIAGNOSIS — N2581 Secondary hyperparathyroidism of renal origin: Secondary | ICD-10-CM | POA: Diagnosis not present

## 2019-12-10 DIAGNOSIS — Z992 Dependence on renal dialysis: Secondary | ICD-10-CM | POA: Diagnosis not present

## 2019-12-10 LAB — UA/M W/RFLX CULTURE, ROUTINE
Bilirubin, UA: NEGATIVE
Glucose, UA: NEGATIVE
Ketones, UA: NEGATIVE
Leukocytes,UA: NEGATIVE
Nitrite, UA: NEGATIVE
RBC, UA: NEGATIVE
Specific Gravity, UA: 1.012 (ref 1.005–1.030)
Urobilinogen, Ur: 0.2 mg/dL (ref 0.2–1.0)
pH, UA: 8.5 — ABNORMAL HIGH (ref 5.0–7.5)

## 2019-12-10 LAB — MICROSCOPIC EXAMINATION

## 2019-12-12 DIAGNOSIS — Z992 Dependence on renal dialysis: Secondary | ICD-10-CM | POA: Diagnosis not present

## 2019-12-12 DIAGNOSIS — N186 End stage renal disease: Secondary | ICD-10-CM | POA: Diagnosis not present

## 2019-12-12 DIAGNOSIS — D509 Iron deficiency anemia, unspecified: Secondary | ICD-10-CM | POA: Diagnosis not present

## 2019-12-12 DIAGNOSIS — D689 Coagulation defect, unspecified: Secondary | ICD-10-CM | POA: Diagnosis not present

## 2019-12-12 DIAGNOSIS — D631 Anemia in chronic kidney disease: Secondary | ICD-10-CM | POA: Diagnosis not present

## 2019-12-12 DIAGNOSIS — E1129 Type 2 diabetes mellitus with other diabetic kidney complication: Secondary | ICD-10-CM | POA: Diagnosis not present

## 2019-12-12 DIAGNOSIS — N2581 Secondary hyperparathyroidism of renal origin: Secondary | ICD-10-CM | POA: Diagnosis not present

## 2019-12-13 DIAGNOSIS — R300 Dysuria: Secondary | ICD-10-CM | POA: Insufficient documentation

## 2019-12-13 DIAGNOSIS — Z0001 Encounter for general adult medical examination with abnormal findings: Secondary | ICD-10-CM | POA: Insufficient documentation

## 2019-12-13 DIAGNOSIS — Z1231 Encounter for screening mammogram for malignant neoplasm of breast: Secondary | ICD-10-CM | POA: Insufficient documentation

## 2019-12-15 DIAGNOSIS — Z992 Dependence on renal dialysis: Secondary | ICD-10-CM | POA: Diagnosis not present

## 2019-12-15 DIAGNOSIS — N186 End stage renal disease: Secondary | ICD-10-CM | POA: Diagnosis not present

## 2019-12-15 DIAGNOSIS — D631 Anemia in chronic kidney disease: Secondary | ICD-10-CM | POA: Diagnosis not present

## 2019-12-15 DIAGNOSIS — N2581 Secondary hyperparathyroidism of renal origin: Secondary | ICD-10-CM | POA: Diagnosis not present

## 2019-12-15 DIAGNOSIS — D689 Coagulation defect, unspecified: Secondary | ICD-10-CM | POA: Diagnosis not present

## 2019-12-15 DIAGNOSIS — E1129 Type 2 diabetes mellitus with other diabetic kidney complication: Secondary | ICD-10-CM | POA: Diagnosis not present

## 2019-12-15 DIAGNOSIS — D509 Iron deficiency anemia, unspecified: Secondary | ICD-10-CM | POA: Diagnosis not present

## 2019-12-17 DIAGNOSIS — D689 Coagulation defect, unspecified: Secondary | ICD-10-CM | POA: Diagnosis not present

## 2019-12-17 DIAGNOSIS — D509 Iron deficiency anemia, unspecified: Secondary | ICD-10-CM | POA: Diagnosis not present

## 2019-12-17 DIAGNOSIS — N186 End stage renal disease: Secondary | ICD-10-CM | POA: Diagnosis not present

## 2019-12-17 DIAGNOSIS — N2581 Secondary hyperparathyroidism of renal origin: Secondary | ICD-10-CM | POA: Diagnosis not present

## 2019-12-17 DIAGNOSIS — D631 Anemia in chronic kidney disease: Secondary | ICD-10-CM | POA: Diagnosis not present

## 2019-12-17 DIAGNOSIS — E1129 Type 2 diabetes mellitus with other diabetic kidney complication: Secondary | ICD-10-CM | POA: Diagnosis not present

## 2019-12-17 DIAGNOSIS — Z992 Dependence on renal dialysis: Secondary | ICD-10-CM | POA: Diagnosis not present

## 2019-12-19 DIAGNOSIS — Z992 Dependence on renal dialysis: Secondary | ICD-10-CM | POA: Diagnosis not present

## 2019-12-19 DIAGNOSIS — N186 End stage renal disease: Secondary | ICD-10-CM | POA: Diagnosis not present

## 2019-12-19 DIAGNOSIS — D509 Iron deficiency anemia, unspecified: Secondary | ICD-10-CM | POA: Diagnosis not present

## 2019-12-19 DIAGNOSIS — D689 Coagulation defect, unspecified: Secondary | ICD-10-CM | POA: Diagnosis not present

## 2019-12-19 DIAGNOSIS — N2581 Secondary hyperparathyroidism of renal origin: Secondary | ICD-10-CM | POA: Diagnosis not present

## 2019-12-19 DIAGNOSIS — D631 Anemia in chronic kidney disease: Secondary | ICD-10-CM | POA: Diagnosis not present

## 2019-12-19 DIAGNOSIS — E1129 Type 2 diabetes mellitus with other diabetic kidney complication: Secondary | ICD-10-CM | POA: Diagnosis not present

## 2019-12-22 DIAGNOSIS — E1129 Type 2 diabetes mellitus with other diabetic kidney complication: Secondary | ICD-10-CM | POA: Diagnosis not present

## 2019-12-22 DIAGNOSIS — Z992 Dependence on renal dialysis: Secondary | ICD-10-CM | POA: Diagnosis not present

## 2019-12-22 DIAGNOSIS — N2581 Secondary hyperparathyroidism of renal origin: Secondary | ICD-10-CM | POA: Diagnosis not present

## 2019-12-22 DIAGNOSIS — D631 Anemia in chronic kidney disease: Secondary | ICD-10-CM | POA: Diagnosis not present

## 2019-12-22 DIAGNOSIS — D509 Iron deficiency anemia, unspecified: Secondary | ICD-10-CM | POA: Diagnosis not present

## 2019-12-22 DIAGNOSIS — D689 Coagulation defect, unspecified: Secondary | ICD-10-CM | POA: Diagnosis not present

## 2019-12-22 DIAGNOSIS — N186 End stage renal disease: Secondary | ICD-10-CM | POA: Diagnosis not present

## 2019-12-24 DIAGNOSIS — N2581 Secondary hyperparathyroidism of renal origin: Secondary | ICD-10-CM | POA: Diagnosis not present

## 2019-12-24 DIAGNOSIS — Z992 Dependence on renal dialysis: Secondary | ICD-10-CM | POA: Diagnosis not present

## 2019-12-24 DIAGNOSIS — D631 Anemia in chronic kidney disease: Secondary | ICD-10-CM | POA: Diagnosis not present

## 2019-12-24 DIAGNOSIS — D689 Coagulation defect, unspecified: Secondary | ICD-10-CM | POA: Diagnosis not present

## 2019-12-24 DIAGNOSIS — N186 End stage renal disease: Secondary | ICD-10-CM | POA: Diagnosis not present

## 2019-12-24 DIAGNOSIS — E1129 Type 2 diabetes mellitus with other diabetic kidney complication: Secondary | ICD-10-CM | POA: Diagnosis not present

## 2019-12-24 DIAGNOSIS — D509 Iron deficiency anemia, unspecified: Secondary | ICD-10-CM | POA: Diagnosis not present

## 2019-12-26 DIAGNOSIS — D631 Anemia in chronic kidney disease: Secondary | ICD-10-CM | POA: Diagnosis not present

## 2019-12-26 DIAGNOSIS — E1129 Type 2 diabetes mellitus with other diabetic kidney complication: Secondary | ICD-10-CM | POA: Diagnosis not present

## 2019-12-26 DIAGNOSIS — D509 Iron deficiency anemia, unspecified: Secondary | ICD-10-CM | POA: Diagnosis not present

## 2019-12-26 DIAGNOSIS — N2581 Secondary hyperparathyroidism of renal origin: Secondary | ICD-10-CM | POA: Diagnosis not present

## 2019-12-26 DIAGNOSIS — Z992 Dependence on renal dialysis: Secondary | ICD-10-CM | POA: Diagnosis not present

## 2019-12-26 DIAGNOSIS — N186 End stage renal disease: Secondary | ICD-10-CM | POA: Diagnosis not present

## 2019-12-26 DIAGNOSIS — D689 Coagulation defect, unspecified: Secondary | ICD-10-CM | POA: Diagnosis not present

## 2019-12-29 DIAGNOSIS — E1129 Type 2 diabetes mellitus with other diabetic kidney complication: Secondary | ICD-10-CM | POA: Diagnosis not present

## 2019-12-29 DIAGNOSIS — D631 Anemia in chronic kidney disease: Secondary | ICD-10-CM | POA: Diagnosis not present

## 2019-12-29 DIAGNOSIS — D689 Coagulation defect, unspecified: Secondary | ICD-10-CM | POA: Diagnosis not present

## 2019-12-29 DIAGNOSIS — N186 End stage renal disease: Secondary | ICD-10-CM | POA: Diagnosis not present

## 2019-12-29 DIAGNOSIS — D509 Iron deficiency anemia, unspecified: Secondary | ICD-10-CM | POA: Diagnosis not present

## 2019-12-29 DIAGNOSIS — N2581 Secondary hyperparathyroidism of renal origin: Secondary | ICD-10-CM | POA: Diagnosis not present

## 2019-12-29 DIAGNOSIS — Z992 Dependence on renal dialysis: Secondary | ICD-10-CM | POA: Diagnosis not present

## 2019-12-31 DIAGNOSIS — D631 Anemia in chronic kidney disease: Secondary | ICD-10-CM | POA: Diagnosis not present

## 2019-12-31 DIAGNOSIS — E1129 Type 2 diabetes mellitus with other diabetic kidney complication: Secondary | ICD-10-CM | POA: Diagnosis not present

## 2019-12-31 DIAGNOSIS — N2581 Secondary hyperparathyroidism of renal origin: Secondary | ICD-10-CM | POA: Diagnosis not present

## 2019-12-31 DIAGNOSIS — N186 End stage renal disease: Secondary | ICD-10-CM | POA: Diagnosis not present

## 2019-12-31 DIAGNOSIS — Z992 Dependence on renal dialysis: Secondary | ICD-10-CM | POA: Diagnosis not present

## 2019-12-31 DIAGNOSIS — D509 Iron deficiency anemia, unspecified: Secondary | ICD-10-CM | POA: Diagnosis not present

## 2019-12-31 DIAGNOSIS — D689 Coagulation defect, unspecified: Secondary | ICD-10-CM | POA: Diagnosis not present

## 2020-01-02 DIAGNOSIS — E1129 Type 2 diabetes mellitus with other diabetic kidney complication: Secondary | ICD-10-CM | POA: Diagnosis not present

## 2020-01-02 DIAGNOSIS — N2581 Secondary hyperparathyroidism of renal origin: Secondary | ICD-10-CM | POA: Diagnosis not present

## 2020-01-02 DIAGNOSIS — N186 End stage renal disease: Secondary | ICD-10-CM | POA: Diagnosis not present

## 2020-01-02 DIAGNOSIS — Z992 Dependence on renal dialysis: Secondary | ICD-10-CM | POA: Diagnosis not present

## 2020-01-02 DIAGNOSIS — D689 Coagulation defect, unspecified: Secondary | ICD-10-CM | POA: Diagnosis not present

## 2020-01-02 DIAGNOSIS — D631 Anemia in chronic kidney disease: Secondary | ICD-10-CM | POA: Diagnosis not present

## 2020-01-02 DIAGNOSIS — D509 Iron deficiency anemia, unspecified: Secondary | ICD-10-CM | POA: Diagnosis not present

## 2020-01-04 DIAGNOSIS — N186 End stage renal disease: Secondary | ICD-10-CM | POA: Diagnosis not present

## 2020-01-04 DIAGNOSIS — N040 Nephrotic syndrome with minor glomerular abnormality: Secondary | ICD-10-CM | POA: Diagnosis not present

## 2020-01-04 DIAGNOSIS — Z992 Dependence on renal dialysis: Secondary | ICD-10-CM | POA: Diagnosis not present

## 2020-01-05 DIAGNOSIS — N2581 Secondary hyperparathyroidism of renal origin: Secondary | ICD-10-CM | POA: Diagnosis not present

## 2020-01-05 DIAGNOSIS — E1129 Type 2 diabetes mellitus with other diabetic kidney complication: Secondary | ICD-10-CM | POA: Diagnosis not present

## 2020-01-05 DIAGNOSIS — Z992 Dependence on renal dialysis: Secondary | ICD-10-CM | POA: Diagnosis not present

## 2020-01-05 DIAGNOSIS — D509 Iron deficiency anemia, unspecified: Secondary | ICD-10-CM | POA: Diagnosis not present

## 2020-01-05 DIAGNOSIS — N186 End stage renal disease: Secondary | ICD-10-CM | POA: Diagnosis not present

## 2020-01-05 DIAGNOSIS — D689 Coagulation defect, unspecified: Secondary | ICD-10-CM | POA: Diagnosis not present

## 2020-01-05 DIAGNOSIS — D631 Anemia in chronic kidney disease: Secondary | ICD-10-CM | POA: Diagnosis not present

## 2020-01-07 DIAGNOSIS — Z992 Dependence on renal dialysis: Secondary | ICD-10-CM | POA: Diagnosis not present

## 2020-01-07 DIAGNOSIS — D509 Iron deficiency anemia, unspecified: Secondary | ICD-10-CM | POA: Diagnosis not present

## 2020-01-07 DIAGNOSIS — N2581 Secondary hyperparathyroidism of renal origin: Secondary | ICD-10-CM | POA: Diagnosis not present

## 2020-01-07 DIAGNOSIS — D631 Anemia in chronic kidney disease: Secondary | ICD-10-CM | POA: Diagnosis not present

## 2020-01-07 DIAGNOSIS — E1129 Type 2 diabetes mellitus with other diabetic kidney complication: Secondary | ICD-10-CM | POA: Diagnosis not present

## 2020-01-07 DIAGNOSIS — N186 End stage renal disease: Secondary | ICD-10-CM | POA: Diagnosis not present

## 2020-01-07 DIAGNOSIS — D689 Coagulation defect, unspecified: Secondary | ICD-10-CM | POA: Diagnosis not present

## 2020-01-09 DIAGNOSIS — N2581 Secondary hyperparathyroidism of renal origin: Secondary | ICD-10-CM | POA: Diagnosis not present

## 2020-01-09 DIAGNOSIS — D631 Anemia in chronic kidney disease: Secondary | ICD-10-CM | POA: Diagnosis not present

## 2020-01-09 DIAGNOSIS — D509 Iron deficiency anemia, unspecified: Secondary | ICD-10-CM | POA: Diagnosis not present

## 2020-01-09 DIAGNOSIS — E1129 Type 2 diabetes mellitus with other diabetic kidney complication: Secondary | ICD-10-CM | POA: Diagnosis not present

## 2020-01-09 DIAGNOSIS — N186 End stage renal disease: Secondary | ICD-10-CM | POA: Diagnosis not present

## 2020-01-09 DIAGNOSIS — D689 Coagulation defect, unspecified: Secondary | ICD-10-CM | POA: Diagnosis not present

## 2020-01-09 DIAGNOSIS — Z992 Dependence on renal dialysis: Secondary | ICD-10-CM | POA: Diagnosis not present

## 2020-01-12 DIAGNOSIS — D631 Anemia in chronic kidney disease: Secondary | ICD-10-CM | POA: Diagnosis not present

## 2020-01-12 DIAGNOSIS — N2581 Secondary hyperparathyroidism of renal origin: Secondary | ICD-10-CM | POA: Diagnosis not present

## 2020-01-12 DIAGNOSIS — E1129 Type 2 diabetes mellitus with other diabetic kidney complication: Secondary | ICD-10-CM | POA: Diagnosis not present

## 2020-01-12 DIAGNOSIS — N186 End stage renal disease: Secondary | ICD-10-CM | POA: Diagnosis not present

## 2020-01-12 DIAGNOSIS — Z992 Dependence on renal dialysis: Secondary | ICD-10-CM | POA: Diagnosis not present

## 2020-01-12 DIAGNOSIS — D689 Coagulation defect, unspecified: Secondary | ICD-10-CM | POA: Diagnosis not present

## 2020-01-12 DIAGNOSIS — D509 Iron deficiency anemia, unspecified: Secondary | ICD-10-CM | POA: Diagnosis not present

## 2020-01-14 DIAGNOSIS — E1129 Type 2 diabetes mellitus with other diabetic kidney complication: Secondary | ICD-10-CM | POA: Diagnosis not present

## 2020-01-14 DIAGNOSIS — D689 Coagulation defect, unspecified: Secondary | ICD-10-CM | POA: Diagnosis not present

## 2020-01-14 DIAGNOSIS — D631 Anemia in chronic kidney disease: Secondary | ICD-10-CM | POA: Diagnosis not present

## 2020-01-14 DIAGNOSIS — D509 Iron deficiency anemia, unspecified: Secondary | ICD-10-CM | POA: Diagnosis not present

## 2020-01-14 DIAGNOSIS — N186 End stage renal disease: Secondary | ICD-10-CM | POA: Diagnosis not present

## 2020-01-14 DIAGNOSIS — N2581 Secondary hyperparathyroidism of renal origin: Secondary | ICD-10-CM | POA: Diagnosis not present

## 2020-01-14 DIAGNOSIS — Z992 Dependence on renal dialysis: Secondary | ICD-10-CM | POA: Diagnosis not present

## 2020-01-16 DIAGNOSIS — E1129 Type 2 diabetes mellitus with other diabetic kidney complication: Secondary | ICD-10-CM | POA: Diagnosis not present

## 2020-01-16 DIAGNOSIS — Z992 Dependence on renal dialysis: Secondary | ICD-10-CM | POA: Diagnosis not present

## 2020-01-16 DIAGNOSIS — D689 Coagulation defect, unspecified: Secondary | ICD-10-CM | POA: Diagnosis not present

## 2020-01-16 DIAGNOSIS — N186 End stage renal disease: Secondary | ICD-10-CM | POA: Diagnosis not present

## 2020-01-16 DIAGNOSIS — D631 Anemia in chronic kidney disease: Secondary | ICD-10-CM | POA: Diagnosis not present

## 2020-01-16 DIAGNOSIS — D509 Iron deficiency anemia, unspecified: Secondary | ICD-10-CM | POA: Diagnosis not present

## 2020-01-16 DIAGNOSIS — N2581 Secondary hyperparathyroidism of renal origin: Secondary | ICD-10-CM | POA: Diagnosis not present

## 2020-01-19 DIAGNOSIS — N2581 Secondary hyperparathyroidism of renal origin: Secondary | ICD-10-CM | POA: Diagnosis not present

## 2020-01-19 DIAGNOSIS — D689 Coagulation defect, unspecified: Secondary | ICD-10-CM | POA: Diagnosis not present

## 2020-01-19 DIAGNOSIS — D509 Iron deficiency anemia, unspecified: Secondary | ICD-10-CM | POA: Diagnosis not present

## 2020-01-19 DIAGNOSIS — N186 End stage renal disease: Secondary | ICD-10-CM | POA: Diagnosis not present

## 2020-01-19 DIAGNOSIS — Z992 Dependence on renal dialysis: Secondary | ICD-10-CM | POA: Diagnosis not present

## 2020-01-19 DIAGNOSIS — E1129 Type 2 diabetes mellitus with other diabetic kidney complication: Secondary | ICD-10-CM | POA: Diagnosis not present

## 2020-01-19 DIAGNOSIS — D631 Anemia in chronic kidney disease: Secondary | ICD-10-CM | POA: Diagnosis not present

## 2020-01-21 DIAGNOSIS — D509 Iron deficiency anemia, unspecified: Secondary | ICD-10-CM | POA: Diagnosis not present

## 2020-01-21 DIAGNOSIS — N186 End stage renal disease: Secondary | ICD-10-CM | POA: Diagnosis not present

## 2020-01-21 DIAGNOSIS — N2581 Secondary hyperparathyroidism of renal origin: Secondary | ICD-10-CM | POA: Diagnosis not present

## 2020-01-21 DIAGNOSIS — D689 Coagulation defect, unspecified: Secondary | ICD-10-CM | POA: Diagnosis not present

## 2020-01-21 DIAGNOSIS — E1129 Type 2 diabetes mellitus with other diabetic kidney complication: Secondary | ICD-10-CM | POA: Diagnosis not present

## 2020-01-21 DIAGNOSIS — D631 Anemia in chronic kidney disease: Secondary | ICD-10-CM | POA: Diagnosis not present

## 2020-01-21 DIAGNOSIS — Z992 Dependence on renal dialysis: Secondary | ICD-10-CM | POA: Diagnosis not present

## 2020-01-23 DIAGNOSIS — D689 Coagulation defect, unspecified: Secondary | ICD-10-CM | POA: Diagnosis not present

## 2020-01-23 DIAGNOSIS — D631 Anemia in chronic kidney disease: Secondary | ICD-10-CM | POA: Diagnosis not present

## 2020-01-23 DIAGNOSIS — N2581 Secondary hyperparathyroidism of renal origin: Secondary | ICD-10-CM | POA: Diagnosis not present

## 2020-01-23 DIAGNOSIS — D509 Iron deficiency anemia, unspecified: Secondary | ICD-10-CM | POA: Diagnosis not present

## 2020-01-23 DIAGNOSIS — N186 End stage renal disease: Secondary | ICD-10-CM | POA: Diagnosis not present

## 2020-01-23 DIAGNOSIS — Z992 Dependence on renal dialysis: Secondary | ICD-10-CM | POA: Diagnosis not present

## 2020-01-23 DIAGNOSIS — E1129 Type 2 diabetes mellitus with other diabetic kidney complication: Secondary | ICD-10-CM | POA: Diagnosis not present

## 2020-01-26 DIAGNOSIS — Z992 Dependence on renal dialysis: Secondary | ICD-10-CM | POA: Diagnosis not present

## 2020-01-26 DIAGNOSIS — D689 Coagulation defect, unspecified: Secondary | ICD-10-CM | POA: Diagnosis not present

## 2020-01-26 DIAGNOSIS — N186 End stage renal disease: Secondary | ICD-10-CM | POA: Diagnosis not present

## 2020-01-26 DIAGNOSIS — D631 Anemia in chronic kidney disease: Secondary | ICD-10-CM | POA: Diagnosis not present

## 2020-01-26 DIAGNOSIS — E1129 Type 2 diabetes mellitus with other diabetic kidney complication: Secondary | ICD-10-CM | POA: Diagnosis not present

## 2020-01-26 DIAGNOSIS — D509 Iron deficiency anemia, unspecified: Secondary | ICD-10-CM | POA: Diagnosis not present

## 2020-01-26 DIAGNOSIS — N2581 Secondary hyperparathyroidism of renal origin: Secondary | ICD-10-CM | POA: Diagnosis not present

## 2020-01-28 DIAGNOSIS — Z992 Dependence on renal dialysis: Secondary | ICD-10-CM | POA: Diagnosis not present

## 2020-01-28 DIAGNOSIS — D631 Anemia in chronic kidney disease: Secondary | ICD-10-CM | POA: Diagnosis not present

## 2020-01-28 DIAGNOSIS — N2581 Secondary hyperparathyroidism of renal origin: Secondary | ICD-10-CM | POA: Diagnosis not present

## 2020-01-28 DIAGNOSIS — E1129 Type 2 diabetes mellitus with other diabetic kidney complication: Secondary | ICD-10-CM | POA: Diagnosis not present

## 2020-01-28 DIAGNOSIS — N186 End stage renal disease: Secondary | ICD-10-CM | POA: Diagnosis not present

## 2020-01-28 DIAGNOSIS — D509 Iron deficiency anemia, unspecified: Secondary | ICD-10-CM | POA: Diagnosis not present

## 2020-01-28 DIAGNOSIS — D689 Coagulation defect, unspecified: Secondary | ICD-10-CM | POA: Diagnosis not present

## 2020-01-30 DIAGNOSIS — D689 Coagulation defect, unspecified: Secondary | ICD-10-CM | POA: Diagnosis not present

## 2020-01-30 DIAGNOSIS — Z992 Dependence on renal dialysis: Secondary | ICD-10-CM | POA: Diagnosis not present

## 2020-01-30 DIAGNOSIS — N2581 Secondary hyperparathyroidism of renal origin: Secondary | ICD-10-CM | POA: Diagnosis not present

## 2020-01-30 DIAGNOSIS — N186 End stage renal disease: Secondary | ICD-10-CM | POA: Diagnosis not present

## 2020-01-30 DIAGNOSIS — D631 Anemia in chronic kidney disease: Secondary | ICD-10-CM | POA: Diagnosis not present

## 2020-01-30 DIAGNOSIS — D509 Iron deficiency anemia, unspecified: Secondary | ICD-10-CM | POA: Diagnosis not present

## 2020-01-30 DIAGNOSIS — E1129 Type 2 diabetes mellitus with other diabetic kidney complication: Secondary | ICD-10-CM | POA: Diagnosis not present

## 2020-02-01 DIAGNOSIS — Z992 Dependence on renal dialysis: Secondary | ICD-10-CM | POA: Diagnosis not present

## 2020-02-01 DIAGNOSIS — N040 Nephrotic syndrome with minor glomerular abnormality: Secondary | ICD-10-CM | POA: Diagnosis not present

## 2020-02-01 DIAGNOSIS — N186 End stage renal disease: Secondary | ICD-10-CM | POA: Diagnosis not present

## 2020-02-02 DIAGNOSIS — N2581 Secondary hyperparathyroidism of renal origin: Secondary | ICD-10-CM | POA: Diagnosis not present

## 2020-02-02 DIAGNOSIS — D509 Iron deficiency anemia, unspecified: Secondary | ICD-10-CM | POA: Diagnosis not present

## 2020-02-02 DIAGNOSIS — N186 End stage renal disease: Secondary | ICD-10-CM | POA: Diagnosis not present

## 2020-02-02 DIAGNOSIS — E1129 Type 2 diabetes mellitus with other diabetic kidney complication: Secondary | ICD-10-CM | POA: Diagnosis not present

## 2020-02-02 DIAGNOSIS — D689 Coagulation defect, unspecified: Secondary | ICD-10-CM | POA: Diagnosis not present

## 2020-02-02 DIAGNOSIS — Z992 Dependence on renal dialysis: Secondary | ICD-10-CM | POA: Diagnosis not present

## 2020-02-02 DIAGNOSIS — D631 Anemia in chronic kidney disease: Secondary | ICD-10-CM | POA: Diagnosis not present

## 2020-02-04 DIAGNOSIS — D509 Iron deficiency anemia, unspecified: Secondary | ICD-10-CM | POA: Diagnosis not present

## 2020-02-04 DIAGNOSIS — Z992 Dependence on renal dialysis: Secondary | ICD-10-CM | POA: Diagnosis not present

## 2020-02-04 DIAGNOSIS — D689 Coagulation defect, unspecified: Secondary | ICD-10-CM | POA: Diagnosis not present

## 2020-02-04 DIAGNOSIS — E1129 Type 2 diabetes mellitus with other diabetic kidney complication: Secondary | ICD-10-CM | POA: Diagnosis not present

## 2020-02-04 DIAGNOSIS — D631 Anemia in chronic kidney disease: Secondary | ICD-10-CM | POA: Diagnosis not present

## 2020-02-04 DIAGNOSIS — N186 End stage renal disease: Secondary | ICD-10-CM | POA: Diagnosis not present

## 2020-02-04 DIAGNOSIS — N2581 Secondary hyperparathyroidism of renal origin: Secondary | ICD-10-CM | POA: Diagnosis not present

## 2020-02-05 ENCOUNTER — Other Ambulatory Visit: Payer: Self-pay

## 2020-02-05 MED ORDER — AMLODIPINE BESYLATE 10 MG PO TABS: 10.0000 mg | ORAL_TABLET | Freq: Every day | ORAL | 5 refills | Status: DC

## 2020-02-06 DIAGNOSIS — D509 Iron deficiency anemia, unspecified: Secondary | ICD-10-CM | POA: Diagnosis not present

## 2020-02-06 DIAGNOSIS — N2581 Secondary hyperparathyroidism of renal origin: Secondary | ICD-10-CM | POA: Diagnosis not present

## 2020-02-06 DIAGNOSIS — D689 Coagulation defect, unspecified: Secondary | ICD-10-CM | POA: Diagnosis not present

## 2020-02-06 DIAGNOSIS — E1129 Type 2 diabetes mellitus with other diabetic kidney complication: Secondary | ICD-10-CM | POA: Diagnosis not present

## 2020-02-06 DIAGNOSIS — D631 Anemia in chronic kidney disease: Secondary | ICD-10-CM | POA: Diagnosis not present

## 2020-02-06 DIAGNOSIS — N186 End stage renal disease: Secondary | ICD-10-CM | POA: Diagnosis not present

## 2020-02-06 DIAGNOSIS — Z992 Dependence on renal dialysis: Secondary | ICD-10-CM | POA: Diagnosis not present

## 2020-02-09 DIAGNOSIS — E1129 Type 2 diabetes mellitus with other diabetic kidney complication: Secondary | ICD-10-CM | POA: Diagnosis not present

## 2020-02-09 DIAGNOSIS — N2581 Secondary hyperparathyroidism of renal origin: Secondary | ICD-10-CM | POA: Diagnosis not present

## 2020-02-09 DIAGNOSIS — N186 End stage renal disease: Secondary | ICD-10-CM | POA: Diagnosis not present

## 2020-02-09 DIAGNOSIS — D689 Coagulation defect, unspecified: Secondary | ICD-10-CM | POA: Diagnosis not present

## 2020-02-09 DIAGNOSIS — D509 Iron deficiency anemia, unspecified: Secondary | ICD-10-CM | POA: Diagnosis not present

## 2020-02-09 DIAGNOSIS — Z992 Dependence on renal dialysis: Secondary | ICD-10-CM | POA: Diagnosis not present

## 2020-02-09 DIAGNOSIS — D631 Anemia in chronic kidney disease: Secondary | ICD-10-CM | POA: Diagnosis not present

## 2020-02-11 DIAGNOSIS — N186 End stage renal disease: Secondary | ICD-10-CM | POA: Diagnosis not present

## 2020-02-11 DIAGNOSIS — D631 Anemia in chronic kidney disease: Secondary | ICD-10-CM | POA: Diagnosis not present

## 2020-02-11 DIAGNOSIS — N2581 Secondary hyperparathyroidism of renal origin: Secondary | ICD-10-CM | POA: Diagnosis not present

## 2020-02-11 DIAGNOSIS — D689 Coagulation defect, unspecified: Secondary | ICD-10-CM | POA: Diagnosis not present

## 2020-02-11 DIAGNOSIS — E1129 Type 2 diabetes mellitus with other diabetic kidney complication: Secondary | ICD-10-CM | POA: Diagnosis not present

## 2020-02-11 DIAGNOSIS — D509 Iron deficiency anemia, unspecified: Secondary | ICD-10-CM | POA: Diagnosis not present

## 2020-02-11 DIAGNOSIS — Z992 Dependence on renal dialysis: Secondary | ICD-10-CM | POA: Diagnosis not present

## 2020-02-13 DIAGNOSIS — N2581 Secondary hyperparathyroidism of renal origin: Secondary | ICD-10-CM | POA: Diagnosis not present

## 2020-02-13 DIAGNOSIS — N186 End stage renal disease: Secondary | ICD-10-CM | POA: Diagnosis not present

## 2020-02-13 DIAGNOSIS — D509 Iron deficiency anemia, unspecified: Secondary | ICD-10-CM | POA: Diagnosis not present

## 2020-02-13 DIAGNOSIS — D631 Anemia in chronic kidney disease: Secondary | ICD-10-CM | POA: Diagnosis not present

## 2020-02-13 DIAGNOSIS — Z992 Dependence on renal dialysis: Secondary | ICD-10-CM | POA: Diagnosis not present

## 2020-02-13 DIAGNOSIS — D689 Coagulation defect, unspecified: Secondary | ICD-10-CM | POA: Diagnosis not present

## 2020-02-13 DIAGNOSIS — E1129 Type 2 diabetes mellitus with other diabetic kidney complication: Secondary | ICD-10-CM | POA: Diagnosis not present

## 2020-02-16 DIAGNOSIS — D631 Anemia in chronic kidney disease: Secondary | ICD-10-CM | POA: Diagnosis not present

## 2020-02-16 DIAGNOSIS — N186 End stage renal disease: Secondary | ICD-10-CM | POA: Diagnosis not present

## 2020-02-16 DIAGNOSIS — Z992 Dependence on renal dialysis: Secondary | ICD-10-CM | POA: Diagnosis not present

## 2020-02-16 DIAGNOSIS — D509 Iron deficiency anemia, unspecified: Secondary | ICD-10-CM | POA: Diagnosis not present

## 2020-02-16 DIAGNOSIS — D689 Coagulation defect, unspecified: Secondary | ICD-10-CM | POA: Diagnosis not present

## 2020-02-16 DIAGNOSIS — E1129 Type 2 diabetes mellitus with other diabetic kidney complication: Secondary | ICD-10-CM | POA: Diagnosis not present

## 2020-02-16 DIAGNOSIS — N2581 Secondary hyperparathyroidism of renal origin: Secondary | ICD-10-CM | POA: Diagnosis not present

## 2020-02-18 DIAGNOSIS — D509 Iron deficiency anemia, unspecified: Secondary | ICD-10-CM | POA: Diagnosis not present

## 2020-02-18 DIAGNOSIS — D631 Anemia in chronic kidney disease: Secondary | ICD-10-CM | POA: Diagnosis not present

## 2020-02-18 DIAGNOSIS — Z992 Dependence on renal dialysis: Secondary | ICD-10-CM | POA: Diagnosis not present

## 2020-02-18 DIAGNOSIS — D689 Coagulation defect, unspecified: Secondary | ICD-10-CM | POA: Diagnosis not present

## 2020-02-18 DIAGNOSIS — N186 End stage renal disease: Secondary | ICD-10-CM | POA: Diagnosis not present

## 2020-02-18 DIAGNOSIS — E1129 Type 2 diabetes mellitus with other diabetic kidney complication: Secondary | ICD-10-CM | POA: Diagnosis not present

## 2020-02-18 DIAGNOSIS — N2581 Secondary hyperparathyroidism of renal origin: Secondary | ICD-10-CM | POA: Diagnosis not present

## 2020-02-20 DIAGNOSIS — D631 Anemia in chronic kidney disease: Secondary | ICD-10-CM | POA: Diagnosis not present

## 2020-02-20 DIAGNOSIS — D689 Coagulation defect, unspecified: Secondary | ICD-10-CM | POA: Diagnosis not present

## 2020-02-20 DIAGNOSIS — E1129 Type 2 diabetes mellitus with other diabetic kidney complication: Secondary | ICD-10-CM | POA: Diagnosis not present

## 2020-02-20 DIAGNOSIS — D509 Iron deficiency anemia, unspecified: Secondary | ICD-10-CM | POA: Diagnosis not present

## 2020-02-20 DIAGNOSIS — Z992 Dependence on renal dialysis: Secondary | ICD-10-CM | POA: Diagnosis not present

## 2020-02-20 DIAGNOSIS — N2581 Secondary hyperparathyroidism of renal origin: Secondary | ICD-10-CM | POA: Diagnosis not present

## 2020-02-20 DIAGNOSIS — N186 End stage renal disease: Secondary | ICD-10-CM | POA: Diagnosis not present

## 2020-02-23 DIAGNOSIS — N186 End stage renal disease: Secondary | ICD-10-CM | POA: Diagnosis not present

## 2020-02-23 DIAGNOSIS — N2581 Secondary hyperparathyroidism of renal origin: Secondary | ICD-10-CM | POA: Diagnosis not present

## 2020-02-23 DIAGNOSIS — D689 Coagulation defect, unspecified: Secondary | ICD-10-CM | POA: Diagnosis not present

## 2020-02-23 DIAGNOSIS — D631 Anemia in chronic kidney disease: Secondary | ICD-10-CM | POA: Diagnosis not present

## 2020-02-23 DIAGNOSIS — E1129 Type 2 diabetes mellitus with other diabetic kidney complication: Secondary | ICD-10-CM | POA: Diagnosis not present

## 2020-02-23 DIAGNOSIS — D509 Iron deficiency anemia, unspecified: Secondary | ICD-10-CM | POA: Diagnosis not present

## 2020-02-23 DIAGNOSIS — Z992 Dependence on renal dialysis: Secondary | ICD-10-CM | POA: Diagnosis not present

## 2020-02-25 DIAGNOSIS — N186 End stage renal disease: Secondary | ICD-10-CM | POA: Diagnosis not present

## 2020-02-25 DIAGNOSIS — N2581 Secondary hyperparathyroidism of renal origin: Secondary | ICD-10-CM | POA: Diagnosis not present

## 2020-02-25 DIAGNOSIS — E1129 Type 2 diabetes mellitus with other diabetic kidney complication: Secondary | ICD-10-CM | POA: Diagnosis not present

## 2020-02-25 DIAGNOSIS — D509 Iron deficiency anemia, unspecified: Secondary | ICD-10-CM | POA: Diagnosis not present

## 2020-02-25 DIAGNOSIS — Z992 Dependence on renal dialysis: Secondary | ICD-10-CM | POA: Diagnosis not present

## 2020-02-25 DIAGNOSIS — D689 Coagulation defect, unspecified: Secondary | ICD-10-CM | POA: Diagnosis not present

## 2020-02-25 DIAGNOSIS — D631 Anemia in chronic kidney disease: Secondary | ICD-10-CM | POA: Diagnosis not present

## 2020-02-27 DIAGNOSIS — Z992 Dependence on renal dialysis: Secondary | ICD-10-CM | POA: Diagnosis not present

## 2020-02-27 DIAGNOSIS — D509 Iron deficiency anemia, unspecified: Secondary | ICD-10-CM | POA: Diagnosis not present

## 2020-02-27 DIAGNOSIS — D689 Coagulation defect, unspecified: Secondary | ICD-10-CM | POA: Diagnosis not present

## 2020-02-27 DIAGNOSIS — D631 Anemia in chronic kidney disease: Secondary | ICD-10-CM | POA: Diagnosis not present

## 2020-02-27 DIAGNOSIS — N186 End stage renal disease: Secondary | ICD-10-CM | POA: Diagnosis not present

## 2020-02-27 DIAGNOSIS — E1129 Type 2 diabetes mellitus with other diabetic kidney complication: Secondary | ICD-10-CM | POA: Diagnosis not present

## 2020-02-27 DIAGNOSIS — N2581 Secondary hyperparathyroidism of renal origin: Secondary | ICD-10-CM | POA: Diagnosis not present

## 2020-03-01 DIAGNOSIS — D509 Iron deficiency anemia, unspecified: Secondary | ICD-10-CM | POA: Diagnosis not present

## 2020-03-01 DIAGNOSIS — D689 Coagulation defect, unspecified: Secondary | ICD-10-CM | POA: Diagnosis not present

## 2020-03-01 DIAGNOSIS — D631 Anemia in chronic kidney disease: Secondary | ICD-10-CM | POA: Diagnosis not present

## 2020-03-01 DIAGNOSIS — N2581 Secondary hyperparathyroidism of renal origin: Secondary | ICD-10-CM | POA: Diagnosis not present

## 2020-03-01 DIAGNOSIS — N186 End stage renal disease: Secondary | ICD-10-CM | POA: Diagnosis not present

## 2020-03-01 DIAGNOSIS — Z992 Dependence on renal dialysis: Secondary | ICD-10-CM | POA: Diagnosis not present

## 2020-03-01 DIAGNOSIS — E1129 Type 2 diabetes mellitus with other diabetic kidney complication: Secondary | ICD-10-CM | POA: Diagnosis not present

## 2020-03-03 ENCOUNTER — Telehealth: Payer: Self-pay

## 2020-03-03 DIAGNOSIS — E1129 Type 2 diabetes mellitus with other diabetic kidney complication: Secondary | ICD-10-CM | POA: Diagnosis not present

## 2020-03-03 DIAGNOSIS — D689 Coagulation defect, unspecified: Secondary | ICD-10-CM | POA: Diagnosis not present

## 2020-03-03 DIAGNOSIS — D631 Anemia in chronic kidney disease: Secondary | ICD-10-CM | POA: Diagnosis not present

## 2020-03-03 DIAGNOSIS — N186 End stage renal disease: Secondary | ICD-10-CM | POA: Diagnosis not present

## 2020-03-03 DIAGNOSIS — Z992 Dependence on renal dialysis: Secondary | ICD-10-CM | POA: Diagnosis not present

## 2020-03-03 DIAGNOSIS — N040 Nephrotic syndrome with minor glomerular abnormality: Secondary | ICD-10-CM | POA: Diagnosis not present

## 2020-03-03 DIAGNOSIS — N2581 Secondary hyperparathyroidism of renal origin: Secondary | ICD-10-CM | POA: Diagnosis not present

## 2020-03-03 DIAGNOSIS — D509 Iron deficiency anemia, unspecified: Secondary | ICD-10-CM | POA: Diagnosis not present

## 2020-03-03 NOTE — Telephone Encounter (Signed)
lmom to confirm and screen for 03-08-20 ov.

## 2020-03-05 DIAGNOSIS — N2581 Secondary hyperparathyroidism of renal origin: Secondary | ICD-10-CM | POA: Diagnosis not present

## 2020-03-05 DIAGNOSIS — N186 End stage renal disease: Secondary | ICD-10-CM | POA: Diagnosis not present

## 2020-03-05 DIAGNOSIS — D509 Iron deficiency anemia, unspecified: Secondary | ICD-10-CM | POA: Diagnosis not present

## 2020-03-05 DIAGNOSIS — D689 Coagulation defect, unspecified: Secondary | ICD-10-CM | POA: Diagnosis not present

## 2020-03-05 DIAGNOSIS — Z992 Dependence on renal dialysis: Secondary | ICD-10-CM | POA: Diagnosis not present

## 2020-03-05 DIAGNOSIS — E1129 Type 2 diabetes mellitus with other diabetic kidney complication: Secondary | ICD-10-CM | POA: Diagnosis not present

## 2020-03-05 DIAGNOSIS — D631 Anemia in chronic kidney disease: Secondary | ICD-10-CM | POA: Diagnosis not present

## 2020-03-08 ENCOUNTER — Ambulatory Visit: Payer: Medicare Other | Admitting: Nurse Practitioner

## 2020-03-08 DIAGNOSIS — E1129 Type 2 diabetes mellitus with other diabetic kidney complication: Secondary | ICD-10-CM | POA: Diagnosis not present

## 2020-03-08 DIAGNOSIS — N2581 Secondary hyperparathyroidism of renal origin: Secondary | ICD-10-CM | POA: Diagnosis not present

## 2020-03-08 DIAGNOSIS — D509 Iron deficiency anemia, unspecified: Secondary | ICD-10-CM | POA: Diagnosis not present

## 2020-03-08 DIAGNOSIS — Z992 Dependence on renal dialysis: Secondary | ICD-10-CM | POA: Diagnosis not present

## 2020-03-08 DIAGNOSIS — N186 End stage renal disease: Secondary | ICD-10-CM | POA: Diagnosis not present

## 2020-03-08 DIAGNOSIS — D631 Anemia in chronic kidney disease: Secondary | ICD-10-CM | POA: Diagnosis not present

## 2020-03-08 DIAGNOSIS — D689 Coagulation defect, unspecified: Secondary | ICD-10-CM | POA: Diagnosis not present

## 2020-03-10 DIAGNOSIS — N2581 Secondary hyperparathyroidism of renal origin: Secondary | ICD-10-CM | POA: Diagnosis not present

## 2020-03-10 DIAGNOSIS — D631 Anemia in chronic kidney disease: Secondary | ICD-10-CM | POA: Diagnosis not present

## 2020-03-10 DIAGNOSIS — D689 Coagulation defect, unspecified: Secondary | ICD-10-CM | POA: Diagnosis not present

## 2020-03-10 DIAGNOSIS — N186 End stage renal disease: Secondary | ICD-10-CM | POA: Diagnosis not present

## 2020-03-10 DIAGNOSIS — E1129 Type 2 diabetes mellitus with other diabetic kidney complication: Secondary | ICD-10-CM | POA: Diagnosis not present

## 2020-03-10 DIAGNOSIS — Z992 Dependence on renal dialysis: Secondary | ICD-10-CM | POA: Diagnosis not present

## 2020-03-10 DIAGNOSIS — D509 Iron deficiency anemia, unspecified: Secondary | ICD-10-CM | POA: Diagnosis not present

## 2020-03-12 DIAGNOSIS — D689 Coagulation defect, unspecified: Secondary | ICD-10-CM | POA: Diagnosis not present

## 2020-03-12 DIAGNOSIS — N2581 Secondary hyperparathyroidism of renal origin: Secondary | ICD-10-CM | POA: Diagnosis not present

## 2020-03-12 DIAGNOSIS — E1129 Type 2 diabetes mellitus with other diabetic kidney complication: Secondary | ICD-10-CM | POA: Diagnosis not present

## 2020-03-12 DIAGNOSIS — D631 Anemia in chronic kidney disease: Secondary | ICD-10-CM | POA: Diagnosis not present

## 2020-03-12 DIAGNOSIS — Z992 Dependence on renal dialysis: Secondary | ICD-10-CM | POA: Diagnosis not present

## 2020-03-12 DIAGNOSIS — D509 Iron deficiency anemia, unspecified: Secondary | ICD-10-CM | POA: Diagnosis not present

## 2020-03-12 DIAGNOSIS — N186 End stage renal disease: Secondary | ICD-10-CM | POA: Diagnosis not present

## 2020-03-15 DIAGNOSIS — N186 End stage renal disease: Secondary | ICD-10-CM | POA: Diagnosis not present

## 2020-03-15 DIAGNOSIS — Z992 Dependence on renal dialysis: Secondary | ICD-10-CM | POA: Diagnosis not present

## 2020-03-15 DIAGNOSIS — N2581 Secondary hyperparathyroidism of renal origin: Secondary | ICD-10-CM | POA: Diagnosis not present

## 2020-03-15 DIAGNOSIS — D631 Anemia in chronic kidney disease: Secondary | ICD-10-CM | POA: Diagnosis not present

## 2020-03-15 DIAGNOSIS — D509 Iron deficiency anemia, unspecified: Secondary | ICD-10-CM | POA: Diagnosis not present

## 2020-03-15 DIAGNOSIS — E1129 Type 2 diabetes mellitus with other diabetic kidney complication: Secondary | ICD-10-CM | POA: Diagnosis not present

## 2020-03-15 DIAGNOSIS — D689 Coagulation defect, unspecified: Secondary | ICD-10-CM | POA: Diagnosis not present

## 2020-03-16 DIAGNOSIS — D509 Iron deficiency anemia, unspecified: Secondary | ICD-10-CM | POA: Diagnosis not present

## 2020-03-16 DIAGNOSIS — D631 Anemia in chronic kidney disease: Secondary | ICD-10-CM | POA: Diagnosis not present

## 2020-03-16 DIAGNOSIS — Z992 Dependence on renal dialysis: Secondary | ICD-10-CM | POA: Diagnosis not present

## 2020-03-16 DIAGNOSIS — E1129 Type 2 diabetes mellitus with other diabetic kidney complication: Secondary | ICD-10-CM | POA: Diagnosis not present

## 2020-03-16 DIAGNOSIS — N2581 Secondary hyperparathyroidism of renal origin: Secondary | ICD-10-CM | POA: Diagnosis not present

## 2020-03-16 DIAGNOSIS — D689 Coagulation defect, unspecified: Secondary | ICD-10-CM | POA: Diagnosis not present

## 2020-03-16 DIAGNOSIS — N186 End stage renal disease: Secondary | ICD-10-CM | POA: Diagnosis not present

## 2020-03-17 DIAGNOSIS — D509 Iron deficiency anemia, unspecified: Secondary | ICD-10-CM | POA: Diagnosis not present

## 2020-03-17 DIAGNOSIS — E1129 Type 2 diabetes mellitus with other diabetic kidney complication: Secondary | ICD-10-CM | POA: Diagnosis not present

## 2020-03-17 DIAGNOSIS — D631 Anemia in chronic kidney disease: Secondary | ICD-10-CM | POA: Diagnosis not present

## 2020-03-17 DIAGNOSIS — N186 End stage renal disease: Secondary | ICD-10-CM | POA: Diagnosis not present

## 2020-03-17 DIAGNOSIS — N2581 Secondary hyperparathyroidism of renal origin: Secondary | ICD-10-CM | POA: Diagnosis not present

## 2020-03-17 DIAGNOSIS — Z992 Dependence on renal dialysis: Secondary | ICD-10-CM | POA: Diagnosis not present

## 2020-03-17 DIAGNOSIS — D689 Coagulation defect, unspecified: Secondary | ICD-10-CM | POA: Diagnosis not present

## 2020-03-19 DIAGNOSIS — N2581 Secondary hyperparathyroidism of renal origin: Secondary | ICD-10-CM | POA: Diagnosis not present

## 2020-03-19 DIAGNOSIS — D689 Coagulation defect, unspecified: Secondary | ICD-10-CM | POA: Diagnosis not present

## 2020-03-19 DIAGNOSIS — E1129 Type 2 diabetes mellitus with other diabetic kidney complication: Secondary | ICD-10-CM | POA: Diagnosis not present

## 2020-03-19 DIAGNOSIS — D631 Anemia in chronic kidney disease: Secondary | ICD-10-CM | POA: Diagnosis not present

## 2020-03-19 DIAGNOSIS — N186 End stage renal disease: Secondary | ICD-10-CM | POA: Diagnosis not present

## 2020-03-19 DIAGNOSIS — Z992 Dependence on renal dialysis: Secondary | ICD-10-CM | POA: Diagnosis not present

## 2020-03-19 DIAGNOSIS — D509 Iron deficiency anemia, unspecified: Secondary | ICD-10-CM | POA: Diagnosis not present

## 2020-03-22 DIAGNOSIS — D689 Coagulation defect, unspecified: Secondary | ICD-10-CM | POA: Diagnosis not present

## 2020-03-22 DIAGNOSIS — N186 End stage renal disease: Secondary | ICD-10-CM | POA: Diagnosis not present

## 2020-03-22 DIAGNOSIS — D631 Anemia in chronic kidney disease: Secondary | ICD-10-CM | POA: Diagnosis not present

## 2020-03-22 DIAGNOSIS — E1129 Type 2 diabetes mellitus with other diabetic kidney complication: Secondary | ICD-10-CM | POA: Diagnosis not present

## 2020-03-22 DIAGNOSIS — Z992 Dependence on renal dialysis: Secondary | ICD-10-CM | POA: Diagnosis not present

## 2020-03-22 DIAGNOSIS — N2581 Secondary hyperparathyroidism of renal origin: Secondary | ICD-10-CM | POA: Diagnosis not present

## 2020-03-22 DIAGNOSIS — D509 Iron deficiency anemia, unspecified: Secondary | ICD-10-CM | POA: Diagnosis not present

## 2020-03-24 DIAGNOSIS — E1129 Type 2 diabetes mellitus with other diabetic kidney complication: Secondary | ICD-10-CM | POA: Diagnosis not present

## 2020-03-24 DIAGNOSIS — Z992 Dependence on renal dialysis: Secondary | ICD-10-CM | POA: Diagnosis not present

## 2020-03-24 DIAGNOSIS — D689 Coagulation defect, unspecified: Secondary | ICD-10-CM | POA: Diagnosis not present

## 2020-03-24 DIAGNOSIS — N186 End stage renal disease: Secondary | ICD-10-CM | POA: Diagnosis not present

## 2020-03-24 DIAGNOSIS — N2581 Secondary hyperparathyroidism of renal origin: Secondary | ICD-10-CM | POA: Diagnosis not present

## 2020-03-24 DIAGNOSIS — D509 Iron deficiency anemia, unspecified: Secondary | ICD-10-CM | POA: Diagnosis not present

## 2020-03-24 DIAGNOSIS — D631 Anemia in chronic kidney disease: Secondary | ICD-10-CM | POA: Diagnosis not present

## 2020-03-25 DIAGNOSIS — D509 Iron deficiency anemia, unspecified: Secondary | ICD-10-CM | POA: Diagnosis not present

## 2020-03-25 DIAGNOSIS — N2581 Secondary hyperparathyroidism of renal origin: Secondary | ICD-10-CM | POA: Diagnosis not present

## 2020-03-25 DIAGNOSIS — N186 End stage renal disease: Secondary | ICD-10-CM | POA: Diagnosis not present

## 2020-03-25 DIAGNOSIS — E1129 Type 2 diabetes mellitus with other diabetic kidney complication: Secondary | ICD-10-CM | POA: Diagnosis not present

## 2020-03-25 DIAGNOSIS — Z992 Dependence on renal dialysis: Secondary | ICD-10-CM | POA: Diagnosis not present

## 2020-03-25 DIAGNOSIS — D631 Anemia in chronic kidney disease: Secondary | ICD-10-CM | POA: Diagnosis not present

## 2020-03-25 DIAGNOSIS — D689 Coagulation defect, unspecified: Secondary | ICD-10-CM | POA: Diagnosis not present

## 2020-03-26 DIAGNOSIS — N2581 Secondary hyperparathyroidism of renal origin: Secondary | ICD-10-CM | POA: Diagnosis not present

## 2020-03-26 DIAGNOSIS — D509 Iron deficiency anemia, unspecified: Secondary | ICD-10-CM | POA: Diagnosis not present

## 2020-03-26 DIAGNOSIS — E1129 Type 2 diabetes mellitus with other diabetic kidney complication: Secondary | ICD-10-CM | POA: Diagnosis not present

## 2020-03-26 DIAGNOSIS — D689 Coagulation defect, unspecified: Secondary | ICD-10-CM | POA: Diagnosis not present

## 2020-03-26 DIAGNOSIS — Z992 Dependence on renal dialysis: Secondary | ICD-10-CM | POA: Diagnosis not present

## 2020-03-26 DIAGNOSIS — N186 End stage renal disease: Secondary | ICD-10-CM | POA: Diagnosis not present

## 2020-03-26 DIAGNOSIS — D631 Anemia in chronic kidney disease: Secondary | ICD-10-CM | POA: Diagnosis not present

## 2020-03-29 DIAGNOSIS — N2581 Secondary hyperparathyroidism of renal origin: Secondary | ICD-10-CM | POA: Diagnosis not present

## 2020-03-29 DIAGNOSIS — E1129 Type 2 diabetes mellitus with other diabetic kidney complication: Secondary | ICD-10-CM | POA: Diagnosis not present

## 2020-03-29 DIAGNOSIS — D509 Iron deficiency anemia, unspecified: Secondary | ICD-10-CM | POA: Diagnosis not present

## 2020-03-29 DIAGNOSIS — N186 End stage renal disease: Secondary | ICD-10-CM | POA: Diagnosis not present

## 2020-03-29 DIAGNOSIS — Z992 Dependence on renal dialysis: Secondary | ICD-10-CM | POA: Diagnosis not present

## 2020-03-29 DIAGNOSIS — D689 Coagulation defect, unspecified: Secondary | ICD-10-CM | POA: Diagnosis not present

## 2020-03-29 DIAGNOSIS — D631 Anemia in chronic kidney disease: Secondary | ICD-10-CM | POA: Diagnosis not present

## 2020-03-31 DIAGNOSIS — N2581 Secondary hyperparathyroidism of renal origin: Secondary | ICD-10-CM | POA: Diagnosis not present

## 2020-03-31 DIAGNOSIS — Z992 Dependence on renal dialysis: Secondary | ICD-10-CM | POA: Diagnosis not present

## 2020-03-31 DIAGNOSIS — D631 Anemia in chronic kidney disease: Secondary | ICD-10-CM | POA: Diagnosis not present

## 2020-03-31 DIAGNOSIS — D689 Coagulation defect, unspecified: Secondary | ICD-10-CM | POA: Diagnosis not present

## 2020-03-31 DIAGNOSIS — D509 Iron deficiency anemia, unspecified: Secondary | ICD-10-CM | POA: Diagnosis not present

## 2020-03-31 DIAGNOSIS — N186 End stage renal disease: Secondary | ICD-10-CM | POA: Diagnosis not present

## 2020-03-31 DIAGNOSIS — E1129 Type 2 diabetes mellitus with other diabetic kidney complication: Secondary | ICD-10-CM | POA: Diagnosis not present

## 2020-04-02 ENCOUNTER — Other Ambulatory Visit (INDEPENDENT_AMBULATORY_CARE_PROVIDER_SITE_OTHER): Payer: Self-pay | Admitting: Nurse Practitioner

## 2020-04-02 DIAGNOSIS — N2581 Secondary hyperparathyroidism of renal origin: Secondary | ICD-10-CM | POA: Diagnosis not present

## 2020-04-02 DIAGNOSIS — E1129 Type 2 diabetes mellitus with other diabetic kidney complication: Secondary | ICD-10-CM | POA: Diagnosis not present

## 2020-04-02 DIAGNOSIS — D689 Coagulation defect, unspecified: Secondary | ICD-10-CM | POA: Diagnosis not present

## 2020-04-02 DIAGNOSIS — D631 Anemia in chronic kidney disease: Secondary | ICD-10-CM | POA: Diagnosis not present

## 2020-04-02 DIAGNOSIS — Z992 Dependence on renal dialysis: Secondary | ICD-10-CM | POA: Diagnosis not present

## 2020-04-02 DIAGNOSIS — N040 Nephrotic syndrome with minor glomerular abnormality: Secondary | ICD-10-CM | POA: Diagnosis not present

## 2020-04-02 DIAGNOSIS — D509 Iron deficiency anemia, unspecified: Secondary | ICD-10-CM | POA: Diagnosis not present

## 2020-04-02 DIAGNOSIS — N186 End stage renal disease: Secondary | ICD-10-CM | POA: Diagnosis not present

## 2020-04-02 DIAGNOSIS — T829XXA Unspecified complication of cardiac and vascular prosthetic device, implant and graft, initial encounter: Secondary | ICD-10-CM

## 2020-04-05 DIAGNOSIS — N186 End stage renal disease: Secondary | ICD-10-CM | POA: Diagnosis not present

## 2020-04-05 DIAGNOSIS — D689 Coagulation defect, unspecified: Secondary | ICD-10-CM | POA: Diagnosis not present

## 2020-04-05 DIAGNOSIS — Z992 Dependence on renal dialysis: Secondary | ICD-10-CM | POA: Diagnosis not present

## 2020-04-05 DIAGNOSIS — D509 Iron deficiency anemia, unspecified: Secondary | ICD-10-CM | POA: Diagnosis not present

## 2020-04-05 DIAGNOSIS — N2581 Secondary hyperparathyroidism of renal origin: Secondary | ICD-10-CM | POA: Diagnosis not present

## 2020-04-05 DIAGNOSIS — D631 Anemia in chronic kidney disease: Secondary | ICD-10-CM | POA: Diagnosis not present

## 2020-04-07 DIAGNOSIS — N2581 Secondary hyperparathyroidism of renal origin: Secondary | ICD-10-CM | POA: Diagnosis not present

## 2020-04-07 DIAGNOSIS — N186 End stage renal disease: Secondary | ICD-10-CM | POA: Diagnosis not present

## 2020-04-07 DIAGNOSIS — Z992 Dependence on renal dialysis: Secondary | ICD-10-CM | POA: Diagnosis not present

## 2020-04-07 DIAGNOSIS — D689 Coagulation defect, unspecified: Secondary | ICD-10-CM | POA: Diagnosis not present

## 2020-04-07 DIAGNOSIS — D509 Iron deficiency anemia, unspecified: Secondary | ICD-10-CM | POA: Diagnosis not present

## 2020-04-07 DIAGNOSIS — D631 Anemia in chronic kidney disease: Secondary | ICD-10-CM | POA: Diagnosis not present

## 2020-04-09 DIAGNOSIS — D509 Iron deficiency anemia, unspecified: Secondary | ICD-10-CM | POA: Diagnosis not present

## 2020-04-09 DIAGNOSIS — Z992 Dependence on renal dialysis: Secondary | ICD-10-CM | POA: Diagnosis not present

## 2020-04-09 DIAGNOSIS — N2581 Secondary hyperparathyroidism of renal origin: Secondary | ICD-10-CM | POA: Diagnosis not present

## 2020-04-09 DIAGNOSIS — D631 Anemia in chronic kidney disease: Secondary | ICD-10-CM | POA: Diagnosis not present

## 2020-04-09 DIAGNOSIS — N186 End stage renal disease: Secondary | ICD-10-CM | POA: Diagnosis not present

## 2020-04-09 DIAGNOSIS — D689 Coagulation defect, unspecified: Secondary | ICD-10-CM | POA: Diagnosis not present

## 2020-04-12 DIAGNOSIS — D631 Anemia in chronic kidney disease: Secondary | ICD-10-CM | POA: Diagnosis not present

## 2020-04-12 DIAGNOSIS — D689 Coagulation defect, unspecified: Secondary | ICD-10-CM | POA: Diagnosis not present

## 2020-04-12 DIAGNOSIS — N186 End stage renal disease: Secondary | ICD-10-CM | POA: Diagnosis not present

## 2020-04-12 DIAGNOSIS — Z992 Dependence on renal dialysis: Secondary | ICD-10-CM | POA: Diagnosis not present

## 2020-04-12 DIAGNOSIS — D509 Iron deficiency anemia, unspecified: Secondary | ICD-10-CM | POA: Diagnosis not present

## 2020-04-12 DIAGNOSIS — N2581 Secondary hyperparathyroidism of renal origin: Secondary | ICD-10-CM | POA: Diagnosis not present

## 2020-04-14 DIAGNOSIS — D509 Iron deficiency anemia, unspecified: Secondary | ICD-10-CM | POA: Diagnosis not present

## 2020-04-14 DIAGNOSIS — N2581 Secondary hyperparathyroidism of renal origin: Secondary | ICD-10-CM | POA: Diagnosis not present

## 2020-04-14 DIAGNOSIS — D631 Anemia in chronic kidney disease: Secondary | ICD-10-CM | POA: Diagnosis not present

## 2020-04-14 DIAGNOSIS — D689 Coagulation defect, unspecified: Secondary | ICD-10-CM | POA: Diagnosis not present

## 2020-04-14 DIAGNOSIS — N186 End stage renal disease: Secondary | ICD-10-CM | POA: Diagnosis not present

## 2020-04-14 DIAGNOSIS — Z992 Dependence on renal dialysis: Secondary | ICD-10-CM | POA: Diagnosis not present

## 2020-04-16 DIAGNOSIS — N2581 Secondary hyperparathyroidism of renal origin: Secondary | ICD-10-CM | POA: Diagnosis not present

## 2020-04-16 DIAGNOSIS — D631 Anemia in chronic kidney disease: Secondary | ICD-10-CM | POA: Diagnosis not present

## 2020-04-16 DIAGNOSIS — Z992 Dependence on renal dialysis: Secondary | ICD-10-CM | POA: Diagnosis not present

## 2020-04-16 DIAGNOSIS — N186 End stage renal disease: Secondary | ICD-10-CM | POA: Diagnosis not present

## 2020-04-16 DIAGNOSIS — D509 Iron deficiency anemia, unspecified: Secondary | ICD-10-CM | POA: Diagnosis not present

## 2020-04-16 DIAGNOSIS — D689 Coagulation defect, unspecified: Secondary | ICD-10-CM | POA: Diagnosis not present

## 2020-04-19 DIAGNOSIS — D689 Coagulation defect, unspecified: Secondary | ICD-10-CM | POA: Diagnosis not present

## 2020-04-19 DIAGNOSIS — N2581 Secondary hyperparathyroidism of renal origin: Secondary | ICD-10-CM | POA: Diagnosis not present

## 2020-04-19 DIAGNOSIS — N186 End stage renal disease: Secondary | ICD-10-CM | POA: Diagnosis not present

## 2020-04-19 DIAGNOSIS — Z992 Dependence on renal dialysis: Secondary | ICD-10-CM | POA: Diagnosis not present

## 2020-04-19 DIAGNOSIS — D631 Anemia in chronic kidney disease: Secondary | ICD-10-CM | POA: Diagnosis not present

## 2020-04-19 DIAGNOSIS — D509 Iron deficiency anemia, unspecified: Secondary | ICD-10-CM | POA: Diagnosis not present

## 2020-04-21 DIAGNOSIS — D631 Anemia in chronic kidney disease: Secondary | ICD-10-CM | POA: Diagnosis not present

## 2020-04-21 DIAGNOSIS — D689 Coagulation defect, unspecified: Secondary | ICD-10-CM | POA: Diagnosis not present

## 2020-04-21 DIAGNOSIS — N2581 Secondary hyperparathyroidism of renal origin: Secondary | ICD-10-CM | POA: Diagnosis not present

## 2020-04-21 DIAGNOSIS — D509 Iron deficiency anemia, unspecified: Secondary | ICD-10-CM | POA: Diagnosis not present

## 2020-04-21 DIAGNOSIS — Z992 Dependence on renal dialysis: Secondary | ICD-10-CM | POA: Diagnosis not present

## 2020-04-21 DIAGNOSIS — N186 End stage renal disease: Secondary | ICD-10-CM | POA: Diagnosis not present

## 2020-04-23 DIAGNOSIS — D631 Anemia in chronic kidney disease: Secondary | ICD-10-CM | POA: Diagnosis not present

## 2020-04-23 DIAGNOSIS — Z992 Dependence on renal dialysis: Secondary | ICD-10-CM | POA: Diagnosis not present

## 2020-04-23 DIAGNOSIS — D689 Coagulation defect, unspecified: Secondary | ICD-10-CM | POA: Diagnosis not present

## 2020-04-23 DIAGNOSIS — D509 Iron deficiency anemia, unspecified: Secondary | ICD-10-CM | POA: Diagnosis not present

## 2020-04-23 DIAGNOSIS — N2581 Secondary hyperparathyroidism of renal origin: Secondary | ICD-10-CM | POA: Diagnosis not present

## 2020-04-23 DIAGNOSIS — N186 End stage renal disease: Secondary | ICD-10-CM | POA: Diagnosis not present

## 2020-04-26 DIAGNOSIS — N2581 Secondary hyperparathyroidism of renal origin: Secondary | ICD-10-CM | POA: Diagnosis not present

## 2020-04-26 DIAGNOSIS — D689 Coagulation defect, unspecified: Secondary | ICD-10-CM | POA: Diagnosis not present

## 2020-04-26 DIAGNOSIS — D631 Anemia in chronic kidney disease: Secondary | ICD-10-CM | POA: Diagnosis not present

## 2020-04-26 DIAGNOSIS — N186 End stage renal disease: Secondary | ICD-10-CM | POA: Diagnosis not present

## 2020-04-26 DIAGNOSIS — Z992 Dependence on renal dialysis: Secondary | ICD-10-CM | POA: Diagnosis not present

## 2020-04-26 DIAGNOSIS — D509 Iron deficiency anemia, unspecified: Secondary | ICD-10-CM | POA: Diagnosis not present

## 2020-04-28 DIAGNOSIS — D509 Iron deficiency anemia, unspecified: Secondary | ICD-10-CM | POA: Diagnosis not present

## 2020-04-28 DIAGNOSIS — Z992 Dependence on renal dialysis: Secondary | ICD-10-CM | POA: Diagnosis not present

## 2020-04-28 DIAGNOSIS — D689 Coagulation defect, unspecified: Secondary | ICD-10-CM | POA: Diagnosis not present

## 2020-04-28 DIAGNOSIS — N2581 Secondary hyperparathyroidism of renal origin: Secondary | ICD-10-CM | POA: Diagnosis not present

## 2020-04-28 DIAGNOSIS — N186 End stage renal disease: Secondary | ICD-10-CM | POA: Diagnosis not present

## 2020-04-28 DIAGNOSIS — D631 Anemia in chronic kidney disease: Secondary | ICD-10-CM | POA: Diagnosis not present

## 2020-04-30 DIAGNOSIS — D631 Anemia in chronic kidney disease: Secondary | ICD-10-CM | POA: Diagnosis not present

## 2020-04-30 DIAGNOSIS — D689 Coagulation defect, unspecified: Secondary | ICD-10-CM | POA: Diagnosis not present

## 2020-04-30 DIAGNOSIS — D509 Iron deficiency anemia, unspecified: Secondary | ICD-10-CM | POA: Diagnosis not present

## 2020-04-30 DIAGNOSIS — N2581 Secondary hyperparathyroidism of renal origin: Secondary | ICD-10-CM | POA: Diagnosis not present

## 2020-04-30 DIAGNOSIS — N186 End stage renal disease: Secondary | ICD-10-CM | POA: Diagnosis not present

## 2020-04-30 DIAGNOSIS — Z992 Dependence on renal dialysis: Secondary | ICD-10-CM | POA: Diagnosis not present

## 2020-05-03 DIAGNOSIS — N040 Nephrotic syndrome with minor glomerular abnormality: Secondary | ICD-10-CM | POA: Diagnosis not present

## 2020-05-03 DIAGNOSIS — D631 Anemia in chronic kidney disease: Secondary | ICD-10-CM | POA: Diagnosis not present

## 2020-05-03 DIAGNOSIS — N2581 Secondary hyperparathyroidism of renal origin: Secondary | ICD-10-CM | POA: Diagnosis not present

## 2020-05-03 DIAGNOSIS — Z992 Dependence on renal dialysis: Secondary | ICD-10-CM | POA: Diagnosis not present

## 2020-05-03 DIAGNOSIS — N186 End stage renal disease: Secondary | ICD-10-CM | POA: Diagnosis not present

## 2020-05-03 DIAGNOSIS — D689 Coagulation defect, unspecified: Secondary | ICD-10-CM | POA: Diagnosis not present

## 2020-05-03 DIAGNOSIS — D509 Iron deficiency anemia, unspecified: Secondary | ICD-10-CM | POA: Diagnosis not present

## 2020-05-05 DIAGNOSIS — E1129 Type 2 diabetes mellitus with other diabetic kidney complication: Secondary | ICD-10-CM | POA: Diagnosis not present

## 2020-05-05 DIAGNOSIS — Z992 Dependence on renal dialysis: Secondary | ICD-10-CM | POA: Diagnosis not present

## 2020-05-05 DIAGNOSIS — N2581 Secondary hyperparathyroidism of renal origin: Secondary | ICD-10-CM | POA: Diagnosis not present

## 2020-05-05 DIAGNOSIS — D509 Iron deficiency anemia, unspecified: Secondary | ICD-10-CM | POA: Diagnosis not present

## 2020-05-05 DIAGNOSIS — D689 Coagulation defect, unspecified: Secondary | ICD-10-CM | POA: Diagnosis not present

## 2020-05-05 DIAGNOSIS — D631 Anemia in chronic kidney disease: Secondary | ICD-10-CM | POA: Diagnosis not present

## 2020-05-05 DIAGNOSIS — N186 End stage renal disease: Secondary | ICD-10-CM | POA: Diagnosis not present

## 2020-05-10 DIAGNOSIS — D631 Anemia in chronic kidney disease: Secondary | ICD-10-CM | POA: Diagnosis not present

## 2020-05-10 DIAGNOSIS — D509 Iron deficiency anemia, unspecified: Secondary | ICD-10-CM | POA: Diagnosis not present

## 2020-05-10 DIAGNOSIS — N2581 Secondary hyperparathyroidism of renal origin: Secondary | ICD-10-CM | POA: Diagnosis not present

## 2020-05-10 DIAGNOSIS — E1129 Type 2 diabetes mellitus with other diabetic kidney complication: Secondary | ICD-10-CM | POA: Diagnosis not present

## 2020-05-10 DIAGNOSIS — Z992 Dependence on renal dialysis: Secondary | ICD-10-CM | POA: Diagnosis not present

## 2020-05-10 DIAGNOSIS — N186 End stage renal disease: Secondary | ICD-10-CM | POA: Diagnosis not present

## 2020-05-10 DIAGNOSIS — D689 Coagulation defect, unspecified: Secondary | ICD-10-CM | POA: Diagnosis not present

## 2020-05-12 DIAGNOSIS — Z992 Dependence on renal dialysis: Secondary | ICD-10-CM | POA: Diagnosis not present

## 2020-05-12 DIAGNOSIS — N186 End stage renal disease: Secondary | ICD-10-CM | POA: Diagnosis not present

## 2020-05-12 DIAGNOSIS — D631 Anemia in chronic kidney disease: Secondary | ICD-10-CM | POA: Diagnosis not present

## 2020-05-12 DIAGNOSIS — D509 Iron deficiency anemia, unspecified: Secondary | ICD-10-CM | POA: Diagnosis not present

## 2020-05-12 DIAGNOSIS — E1129 Type 2 diabetes mellitus with other diabetic kidney complication: Secondary | ICD-10-CM | POA: Diagnosis not present

## 2020-05-12 DIAGNOSIS — N2581 Secondary hyperparathyroidism of renal origin: Secondary | ICD-10-CM | POA: Diagnosis not present

## 2020-05-12 DIAGNOSIS — D689 Coagulation defect, unspecified: Secondary | ICD-10-CM | POA: Diagnosis not present

## 2020-05-14 DIAGNOSIS — D689 Coagulation defect, unspecified: Secondary | ICD-10-CM | POA: Diagnosis not present

## 2020-05-14 DIAGNOSIS — E1129 Type 2 diabetes mellitus with other diabetic kidney complication: Secondary | ICD-10-CM | POA: Diagnosis not present

## 2020-05-14 DIAGNOSIS — N186 End stage renal disease: Secondary | ICD-10-CM | POA: Diagnosis not present

## 2020-05-14 DIAGNOSIS — D509 Iron deficiency anemia, unspecified: Secondary | ICD-10-CM | POA: Diagnosis not present

## 2020-05-14 DIAGNOSIS — Z992 Dependence on renal dialysis: Secondary | ICD-10-CM | POA: Diagnosis not present

## 2020-05-14 DIAGNOSIS — N2581 Secondary hyperparathyroidism of renal origin: Secondary | ICD-10-CM | POA: Diagnosis not present

## 2020-05-14 DIAGNOSIS — D631 Anemia in chronic kidney disease: Secondary | ICD-10-CM | POA: Diagnosis not present

## 2020-05-17 DIAGNOSIS — E1129 Type 2 diabetes mellitus with other diabetic kidney complication: Secondary | ICD-10-CM | POA: Diagnosis not present

## 2020-05-17 DIAGNOSIS — Z992 Dependence on renal dialysis: Secondary | ICD-10-CM | POA: Diagnosis not present

## 2020-05-17 DIAGNOSIS — D631 Anemia in chronic kidney disease: Secondary | ICD-10-CM | POA: Diagnosis not present

## 2020-05-17 DIAGNOSIS — D689 Coagulation defect, unspecified: Secondary | ICD-10-CM | POA: Diagnosis not present

## 2020-05-17 DIAGNOSIS — N2581 Secondary hyperparathyroidism of renal origin: Secondary | ICD-10-CM | POA: Diagnosis not present

## 2020-05-17 DIAGNOSIS — D509 Iron deficiency anemia, unspecified: Secondary | ICD-10-CM | POA: Diagnosis not present

## 2020-05-17 DIAGNOSIS — N186 End stage renal disease: Secondary | ICD-10-CM | POA: Diagnosis not present

## 2020-05-19 DIAGNOSIS — D631 Anemia in chronic kidney disease: Secondary | ICD-10-CM | POA: Diagnosis not present

## 2020-05-19 DIAGNOSIS — D689 Coagulation defect, unspecified: Secondary | ICD-10-CM | POA: Diagnosis not present

## 2020-05-19 DIAGNOSIS — D509 Iron deficiency anemia, unspecified: Secondary | ICD-10-CM | POA: Diagnosis not present

## 2020-05-19 DIAGNOSIS — N186 End stage renal disease: Secondary | ICD-10-CM | POA: Diagnosis not present

## 2020-05-19 DIAGNOSIS — E1129 Type 2 diabetes mellitus with other diabetic kidney complication: Secondary | ICD-10-CM | POA: Diagnosis not present

## 2020-05-19 DIAGNOSIS — N2581 Secondary hyperparathyroidism of renal origin: Secondary | ICD-10-CM | POA: Diagnosis not present

## 2020-05-19 DIAGNOSIS — Z992 Dependence on renal dialysis: Secondary | ICD-10-CM | POA: Diagnosis not present

## 2020-05-21 DIAGNOSIS — D689 Coagulation defect, unspecified: Secondary | ICD-10-CM | POA: Diagnosis not present

## 2020-05-21 DIAGNOSIS — D509 Iron deficiency anemia, unspecified: Secondary | ICD-10-CM | POA: Diagnosis not present

## 2020-05-21 DIAGNOSIS — D631 Anemia in chronic kidney disease: Secondary | ICD-10-CM | POA: Diagnosis not present

## 2020-05-21 DIAGNOSIS — N186 End stage renal disease: Secondary | ICD-10-CM | POA: Diagnosis not present

## 2020-05-21 DIAGNOSIS — N2581 Secondary hyperparathyroidism of renal origin: Secondary | ICD-10-CM | POA: Diagnosis not present

## 2020-05-21 DIAGNOSIS — Z992 Dependence on renal dialysis: Secondary | ICD-10-CM | POA: Diagnosis not present

## 2020-05-21 DIAGNOSIS — E1129 Type 2 diabetes mellitus with other diabetic kidney complication: Secondary | ICD-10-CM | POA: Diagnosis not present

## 2020-05-24 DIAGNOSIS — N2581 Secondary hyperparathyroidism of renal origin: Secondary | ICD-10-CM | POA: Diagnosis not present

## 2020-05-24 DIAGNOSIS — E1129 Type 2 diabetes mellitus with other diabetic kidney complication: Secondary | ICD-10-CM | POA: Diagnosis not present

## 2020-05-24 DIAGNOSIS — D689 Coagulation defect, unspecified: Secondary | ICD-10-CM | POA: Diagnosis not present

## 2020-05-24 DIAGNOSIS — D509 Iron deficiency anemia, unspecified: Secondary | ICD-10-CM | POA: Diagnosis not present

## 2020-05-24 DIAGNOSIS — Z992 Dependence on renal dialysis: Secondary | ICD-10-CM | POA: Diagnosis not present

## 2020-05-24 DIAGNOSIS — D631 Anemia in chronic kidney disease: Secondary | ICD-10-CM | POA: Diagnosis not present

## 2020-05-24 DIAGNOSIS — N186 End stage renal disease: Secondary | ICD-10-CM | POA: Diagnosis not present

## 2020-05-26 DIAGNOSIS — D509 Iron deficiency anemia, unspecified: Secondary | ICD-10-CM | POA: Diagnosis not present

## 2020-05-26 DIAGNOSIS — N186 End stage renal disease: Secondary | ICD-10-CM | POA: Diagnosis not present

## 2020-05-26 DIAGNOSIS — Z992 Dependence on renal dialysis: Secondary | ICD-10-CM | POA: Diagnosis not present

## 2020-05-26 DIAGNOSIS — D631 Anemia in chronic kidney disease: Secondary | ICD-10-CM | POA: Diagnosis not present

## 2020-05-26 DIAGNOSIS — N2581 Secondary hyperparathyroidism of renal origin: Secondary | ICD-10-CM | POA: Diagnosis not present

## 2020-05-26 DIAGNOSIS — D689 Coagulation defect, unspecified: Secondary | ICD-10-CM | POA: Diagnosis not present

## 2020-05-26 DIAGNOSIS — E1129 Type 2 diabetes mellitus with other diabetic kidney complication: Secondary | ICD-10-CM | POA: Diagnosis not present

## 2020-05-28 DIAGNOSIS — D689 Coagulation defect, unspecified: Secondary | ICD-10-CM | POA: Diagnosis not present

## 2020-05-28 DIAGNOSIS — E1129 Type 2 diabetes mellitus with other diabetic kidney complication: Secondary | ICD-10-CM | POA: Diagnosis not present

## 2020-05-28 DIAGNOSIS — N186 End stage renal disease: Secondary | ICD-10-CM | POA: Diagnosis not present

## 2020-05-28 DIAGNOSIS — Z992 Dependence on renal dialysis: Secondary | ICD-10-CM | POA: Diagnosis not present

## 2020-05-28 DIAGNOSIS — D509 Iron deficiency anemia, unspecified: Secondary | ICD-10-CM | POA: Diagnosis not present

## 2020-05-28 DIAGNOSIS — D631 Anemia in chronic kidney disease: Secondary | ICD-10-CM | POA: Diagnosis not present

## 2020-05-28 DIAGNOSIS — N2581 Secondary hyperparathyroidism of renal origin: Secondary | ICD-10-CM | POA: Diagnosis not present

## 2020-05-31 DIAGNOSIS — D689 Coagulation defect, unspecified: Secondary | ICD-10-CM | POA: Diagnosis not present

## 2020-05-31 DIAGNOSIS — Z992 Dependence on renal dialysis: Secondary | ICD-10-CM | POA: Diagnosis not present

## 2020-05-31 DIAGNOSIS — D631 Anemia in chronic kidney disease: Secondary | ICD-10-CM | POA: Diagnosis not present

## 2020-05-31 DIAGNOSIS — E1129 Type 2 diabetes mellitus with other diabetic kidney complication: Secondary | ICD-10-CM | POA: Diagnosis not present

## 2020-05-31 DIAGNOSIS — N186 End stage renal disease: Secondary | ICD-10-CM | POA: Diagnosis not present

## 2020-05-31 DIAGNOSIS — D509 Iron deficiency anemia, unspecified: Secondary | ICD-10-CM | POA: Diagnosis not present

## 2020-05-31 DIAGNOSIS — N2581 Secondary hyperparathyroidism of renal origin: Secondary | ICD-10-CM | POA: Diagnosis not present

## 2020-06-02 DIAGNOSIS — Z992 Dependence on renal dialysis: Secondary | ICD-10-CM | POA: Diagnosis not present

## 2020-06-02 DIAGNOSIS — D631 Anemia in chronic kidney disease: Secondary | ICD-10-CM | POA: Diagnosis not present

## 2020-06-02 DIAGNOSIS — N040 Nephrotic syndrome with minor glomerular abnormality: Secondary | ICD-10-CM | POA: Diagnosis not present

## 2020-06-02 DIAGNOSIS — N186 End stage renal disease: Secondary | ICD-10-CM | POA: Diagnosis not present

## 2020-06-02 DIAGNOSIS — D509 Iron deficiency anemia, unspecified: Secondary | ICD-10-CM | POA: Diagnosis not present

## 2020-06-02 DIAGNOSIS — D689 Coagulation defect, unspecified: Secondary | ICD-10-CM | POA: Diagnosis not present

## 2020-06-02 DIAGNOSIS — N2581 Secondary hyperparathyroidism of renal origin: Secondary | ICD-10-CM | POA: Diagnosis not present

## 2020-06-02 DIAGNOSIS — E1129 Type 2 diabetes mellitus with other diabetic kidney complication: Secondary | ICD-10-CM | POA: Diagnosis not present

## 2020-06-04 DIAGNOSIS — N2581 Secondary hyperparathyroidism of renal origin: Secondary | ICD-10-CM | POA: Diagnosis not present

## 2020-06-04 DIAGNOSIS — D631 Anemia in chronic kidney disease: Secondary | ICD-10-CM | POA: Diagnosis not present

## 2020-06-04 DIAGNOSIS — Z992 Dependence on renal dialysis: Secondary | ICD-10-CM | POA: Diagnosis not present

## 2020-06-04 DIAGNOSIS — N186 End stage renal disease: Secondary | ICD-10-CM | POA: Diagnosis not present

## 2020-06-04 DIAGNOSIS — D689 Coagulation defect, unspecified: Secondary | ICD-10-CM | POA: Diagnosis not present

## 2020-06-04 DIAGNOSIS — D509 Iron deficiency anemia, unspecified: Secondary | ICD-10-CM | POA: Diagnosis not present

## 2020-06-07 DIAGNOSIS — D689 Coagulation defect, unspecified: Secondary | ICD-10-CM | POA: Diagnosis not present

## 2020-06-07 DIAGNOSIS — D509 Iron deficiency anemia, unspecified: Secondary | ICD-10-CM | POA: Diagnosis not present

## 2020-06-07 DIAGNOSIS — N2581 Secondary hyperparathyroidism of renal origin: Secondary | ICD-10-CM | POA: Diagnosis not present

## 2020-06-07 DIAGNOSIS — N186 End stage renal disease: Secondary | ICD-10-CM | POA: Diagnosis not present

## 2020-06-07 DIAGNOSIS — Z992 Dependence on renal dialysis: Secondary | ICD-10-CM | POA: Diagnosis not present

## 2020-06-07 DIAGNOSIS — D631 Anemia in chronic kidney disease: Secondary | ICD-10-CM | POA: Diagnosis not present

## 2020-06-09 DIAGNOSIS — N186 End stage renal disease: Secondary | ICD-10-CM | POA: Diagnosis not present

## 2020-06-09 DIAGNOSIS — Z992 Dependence on renal dialysis: Secondary | ICD-10-CM | POA: Diagnosis not present

## 2020-06-09 DIAGNOSIS — N2581 Secondary hyperparathyroidism of renal origin: Secondary | ICD-10-CM | POA: Diagnosis not present

## 2020-06-09 DIAGNOSIS — D509 Iron deficiency anemia, unspecified: Secondary | ICD-10-CM | POA: Diagnosis not present

## 2020-06-09 DIAGNOSIS — D689 Coagulation defect, unspecified: Secondary | ICD-10-CM | POA: Diagnosis not present

## 2020-06-09 DIAGNOSIS — D631 Anemia in chronic kidney disease: Secondary | ICD-10-CM | POA: Diagnosis not present

## 2020-06-11 DIAGNOSIS — D509 Iron deficiency anemia, unspecified: Secondary | ICD-10-CM | POA: Diagnosis not present

## 2020-06-11 DIAGNOSIS — Z992 Dependence on renal dialysis: Secondary | ICD-10-CM | POA: Diagnosis not present

## 2020-06-11 DIAGNOSIS — N186 End stage renal disease: Secondary | ICD-10-CM | POA: Diagnosis not present

## 2020-06-11 DIAGNOSIS — N2581 Secondary hyperparathyroidism of renal origin: Secondary | ICD-10-CM | POA: Diagnosis not present

## 2020-06-11 DIAGNOSIS — D631 Anemia in chronic kidney disease: Secondary | ICD-10-CM | POA: Diagnosis not present

## 2020-06-11 DIAGNOSIS — D689 Coagulation defect, unspecified: Secondary | ICD-10-CM | POA: Diagnosis not present

## 2020-06-14 DIAGNOSIS — N186 End stage renal disease: Secondary | ICD-10-CM | POA: Diagnosis not present

## 2020-06-14 DIAGNOSIS — D689 Coagulation defect, unspecified: Secondary | ICD-10-CM | POA: Diagnosis not present

## 2020-06-14 DIAGNOSIS — N2581 Secondary hyperparathyroidism of renal origin: Secondary | ICD-10-CM | POA: Diagnosis not present

## 2020-06-14 DIAGNOSIS — D631 Anemia in chronic kidney disease: Secondary | ICD-10-CM | POA: Diagnosis not present

## 2020-06-14 DIAGNOSIS — Z992 Dependence on renal dialysis: Secondary | ICD-10-CM | POA: Diagnosis not present

## 2020-06-14 DIAGNOSIS — D509 Iron deficiency anemia, unspecified: Secondary | ICD-10-CM | POA: Diagnosis not present

## 2020-06-16 DIAGNOSIS — D689 Coagulation defect, unspecified: Secondary | ICD-10-CM | POA: Diagnosis not present

## 2020-06-16 DIAGNOSIS — D631 Anemia in chronic kidney disease: Secondary | ICD-10-CM | POA: Diagnosis not present

## 2020-06-16 DIAGNOSIS — D509 Iron deficiency anemia, unspecified: Secondary | ICD-10-CM | POA: Diagnosis not present

## 2020-06-16 DIAGNOSIS — Z992 Dependence on renal dialysis: Secondary | ICD-10-CM | POA: Diagnosis not present

## 2020-06-16 DIAGNOSIS — N186 End stage renal disease: Secondary | ICD-10-CM | POA: Diagnosis not present

## 2020-06-16 DIAGNOSIS — N2581 Secondary hyperparathyroidism of renal origin: Secondary | ICD-10-CM | POA: Diagnosis not present

## 2020-06-18 DIAGNOSIS — Z992 Dependence on renal dialysis: Secondary | ICD-10-CM | POA: Diagnosis not present

## 2020-06-18 DIAGNOSIS — D509 Iron deficiency anemia, unspecified: Secondary | ICD-10-CM | POA: Diagnosis not present

## 2020-06-18 DIAGNOSIS — N186 End stage renal disease: Secondary | ICD-10-CM | POA: Diagnosis not present

## 2020-06-18 DIAGNOSIS — D631 Anemia in chronic kidney disease: Secondary | ICD-10-CM | POA: Diagnosis not present

## 2020-06-18 DIAGNOSIS — N2581 Secondary hyperparathyroidism of renal origin: Secondary | ICD-10-CM | POA: Diagnosis not present

## 2020-06-18 DIAGNOSIS — D689 Coagulation defect, unspecified: Secondary | ICD-10-CM | POA: Diagnosis not present

## 2020-06-21 DIAGNOSIS — N186 End stage renal disease: Secondary | ICD-10-CM | POA: Diagnosis not present

## 2020-06-21 DIAGNOSIS — D509 Iron deficiency anemia, unspecified: Secondary | ICD-10-CM | POA: Diagnosis not present

## 2020-06-21 DIAGNOSIS — D631 Anemia in chronic kidney disease: Secondary | ICD-10-CM | POA: Diagnosis not present

## 2020-06-21 DIAGNOSIS — D689 Coagulation defect, unspecified: Secondary | ICD-10-CM | POA: Diagnosis not present

## 2020-06-21 DIAGNOSIS — N2581 Secondary hyperparathyroidism of renal origin: Secondary | ICD-10-CM | POA: Diagnosis not present

## 2020-06-21 DIAGNOSIS — Z992 Dependence on renal dialysis: Secondary | ICD-10-CM | POA: Diagnosis not present

## 2020-06-23 DIAGNOSIS — D689 Coagulation defect, unspecified: Secondary | ICD-10-CM | POA: Diagnosis not present

## 2020-06-23 DIAGNOSIS — D631 Anemia in chronic kidney disease: Secondary | ICD-10-CM | POA: Diagnosis not present

## 2020-06-23 DIAGNOSIS — N2581 Secondary hyperparathyroidism of renal origin: Secondary | ICD-10-CM | POA: Diagnosis not present

## 2020-06-23 DIAGNOSIS — Z992 Dependence on renal dialysis: Secondary | ICD-10-CM | POA: Diagnosis not present

## 2020-06-23 DIAGNOSIS — D509 Iron deficiency anemia, unspecified: Secondary | ICD-10-CM | POA: Diagnosis not present

## 2020-06-23 DIAGNOSIS — N186 End stage renal disease: Secondary | ICD-10-CM | POA: Diagnosis not present

## 2020-06-25 DIAGNOSIS — D631 Anemia in chronic kidney disease: Secondary | ICD-10-CM | POA: Diagnosis not present

## 2020-06-25 DIAGNOSIS — D689 Coagulation defect, unspecified: Secondary | ICD-10-CM | POA: Diagnosis not present

## 2020-06-25 DIAGNOSIS — N2581 Secondary hyperparathyroidism of renal origin: Secondary | ICD-10-CM | POA: Diagnosis not present

## 2020-06-25 DIAGNOSIS — D509 Iron deficiency anemia, unspecified: Secondary | ICD-10-CM | POA: Diagnosis not present

## 2020-06-25 DIAGNOSIS — Z992 Dependence on renal dialysis: Secondary | ICD-10-CM | POA: Diagnosis not present

## 2020-06-25 DIAGNOSIS — N186 End stage renal disease: Secondary | ICD-10-CM | POA: Diagnosis not present

## 2020-06-28 DIAGNOSIS — N2581 Secondary hyperparathyroidism of renal origin: Secondary | ICD-10-CM | POA: Diagnosis not present

## 2020-06-28 DIAGNOSIS — D631 Anemia in chronic kidney disease: Secondary | ICD-10-CM | POA: Diagnosis not present

## 2020-06-28 DIAGNOSIS — Z992 Dependence on renal dialysis: Secondary | ICD-10-CM | POA: Diagnosis not present

## 2020-06-28 DIAGNOSIS — N186 End stage renal disease: Secondary | ICD-10-CM | POA: Diagnosis not present

## 2020-06-28 DIAGNOSIS — D689 Coagulation defect, unspecified: Secondary | ICD-10-CM | POA: Diagnosis not present

## 2020-06-28 DIAGNOSIS — D509 Iron deficiency anemia, unspecified: Secondary | ICD-10-CM | POA: Diagnosis not present

## 2020-06-29 ENCOUNTER — Telehealth: Payer: Self-pay

## 2020-06-29 NOTE — Telephone Encounter (Signed)
Confirmed and screened for 07-01-20 ov.

## 2020-06-30 DIAGNOSIS — D631 Anemia in chronic kidney disease: Secondary | ICD-10-CM | POA: Diagnosis not present

## 2020-06-30 DIAGNOSIS — N2581 Secondary hyperparathyroidism of renal origin: Secondary | ICD-10-CM | POA: Diagnosis not present

## 2020-06-30 DIAGNOSIS — Z992 Dependence on renal dialysis: Secondary | ICD-10-CM | POA: Diagnosis not present

## 2020-06-30 DIAGNOSIS — D509 Iron deficiency anemia, unspecified: Secondary | ICD-10-CM | POA: Diagnosis not present

## 2020-06-30 DIAGNOSIS — N186 End stage renal disease: Secondary | ICD-10-CM | POA: Diagnosis not present

## 2020-06-30 DIAGNOSIS — D689 Coagulation defect, unspecified: Secondary | ICD-10-CM | POA: Diagnosis not present

## 2020-07-01 ENCOUNTER — Encounter: Payer: Self-pay | Admitting: Nurse Practitioner

## 2020-07-01 ENCOUNTER — Other Ambulatory Visit: Payer: Self-pay

## 2020-07-01 ENCOUNTER — Ambulatory Visit (INDEPENDENT_AMBULATORY_CARE_PROVIDER_SITE_OTHER): Payer: Medicare Other | Admitting: Nurse Practitioner

## 2020-07-01 VITALS — BP 137/66 | HR 81 | Temp 97.9°F | Resp 16 | Ht 66.0 in | Wt 130.8 lb

## 2020-07-01 DIAGNOSIS — R531 Weakness: Secondary | ICD-10-CM

## 2020-07-01 DIAGNOSIS — N184 Chronic kidney disease, stage 4 (severe): Secondary | ICD-10-CM | POA: Diagnosis not present

## 2020-07-01 DIAGNOSIS — R0989 Other specified symptoms and signs involving the circulatory and respiratory systems: Secondary | ICD-10-CM | POA: Diagnosis not present

## 2020-07-01 DIAGNOSIS — R5383 Other fatigue: Secondary | ICD-10-CM | POA: Diagnosis not present

## 2020-07-01 DIAGNOSIS — R630 Anorexia: Secondary | ICD-10-CM

## 2020-07-01 DIAGNOSIS — D631 Anemia in chronic kidney disease: Secondary | ICD-10-CM

## 2020-07-01 DIAGNOSIS — F321 Major depressive disorder, single episode, moderate: Secondary | ICD-10-CM

## 2020-07-01 MED ORDER — MIRTAZAPINE 7.5 MG PO TABS: 7.5000 mg | ORAL_TABLET | Freq: Every day | ORAL | 3 refills | Status: DC

## 2020-07-01 NOTE — Progress Notes (Signed)
[facility] [place], [place] [address]  Internal MEDICINE  Office Visit Note  Patient Name: Kristen Francis  [id number]  [id number]  Date of Service: 07/25/2020   Pt is here for a sick visit.  Chief Complaint  Patient presents with  . Acute Visit    pt feeling very weak/tired, legs feel weak  . Hyperlipidemia  . Hypertension     The patient is here for acute visit. She is concerned about increased fatigue and weakness. Has been going on for past three weeks or so. She is hemodialysis patient. Hoes on Monday, Wednesday, and Friday. States that those days, the fatigue and weakness are worse. She has no appetite on dialysis days and not being able to eat causes her to feel more fatigued and weak. She denies chest pain, pressure, shortness of breath, or headaches. She denies nausea or vomiting. Unable to locate labs more recent than 2019.        Current Medication:  Outpatient Encounter Medications as of 07/01/2020  Medication Sig  . allopurinol (ZYLOPRIM) 100 MG tablet Take 1 tablet (100 mg total) by mouth daily.  Marland Kitchen amLODipine (NORVASC) 10 MG tablet Take 1 tablet (10 mg total) by mouth daily.  Marland Kitchen b complex-vitamin c-folic acid (NEPHRO-VITE) 0.8 MG TABS tablet TAKE 1 TABLET BY MOUTH EVERY DAY (ON DIALYSIS DAYS, TAKE AFTER DIALYSIS TREATMENT)  . carvedilol (COREG) 25 MG tablet Take 25 mg by mouth 2 (two) times daily with a meal.  . cholecalciferol (VITAMIN D) 1000 units tablet Take 1,000 Units by mouth daily.   . ferrous sulfate 325 (65 FE) MG tablet Take 325 mg by mouth daily.   Marland Kitchen lidocaine-prilocaine (EMLA) cream APPLY SMALL AMOUNT TO ACCESS SITE (AVF) 3 TIMES A WEEK 1 HOUR BEFORE DIALYSIS. COVER WITH OCCLUSIVE DRESSING (SARAN WRAP)  . losartan (COZAAR) 50 MG tablet Take 50 mg by mouth daily.  Marland Kitchen torsemide (DEMADEX) 5 MG tablet Take 5 mg by mouth daily.   . [DISCONTINUED] doxycycline (VIBRAMYCIN) 100 MG capsule Take 1 capsule (100 mg total) by mouth  2 (two) times daily.  . mirtazapine (REMERON) 7.5 MG tablet Take 1 tablet (7.5 mg total) by mouth at bedtime. (Patient not taking: Reported on 07/20/2020)   No facility-administered encounter medications on file as of 07/01/2020.      Medical History: Past Medical History:  Diagnosis Date  . Anemia   . Arthritis   . Chronic kidney disease    ESRD  . Complication of anesthesia    had procedure and felt incision early 80s  . Gout   . Hyperlipemia   . Hypertension   . Macular degeneration, right eye   . Stroke ([place])     Today's Vitals   07/01/20 1407  BP: (!) 137/66  Pulse: 81  Resp: 16  Temp: 97.9 F (36.6 C)  SpO2: 99%  Weight: 130 lb 12.8 oz (59.3 kg)  Height: 5\' 6"  (1.676 m)   Body mass index is 21.11 kg/m.  Review of Systems  Constitutional: Positive for appetite change, fatigue and unexpected weight change. Negative for chills.  HENT: Negative for congestion, postnasal drip, rhinorrhea, sneezing and sore throat.   Respiratory: Negative for cough, chest tightness, shortness of breath and wheezing.   Cardiovascular: Negative for chest pain and palpitations.  Gastrointestinal: Positive for nausea. Negative for abdominal pain, constipation, diarrhea and vomiting.  Endocrine: Negative for cold intolerance, heat intolerance, polydipsia and polyuria.  Musculoskeletal: Negative for arthralgias, back pain, joint swelling and  neck pain.  Skin: Negative for rash.  Allergic/Immunologic: Negative for environmental allergies.  Neurological: Positive for weakness. Negative for dizziness, tremors, numbness and headaches.  Hematological: Negative for adenopathy. Does not bruise/bleed easily.  Psychiatric/Behavioral: Negative for behavioral problems (Depression), dysphoric mood, sleep disturbance and suicidal ideas. The patient is not nervous/anxious.     Physical Exam Vitals and nursing note reviewed.  Constitutional:      General: She is not in acute distress.    Appearance:  Normal appearance.  HENT:     Head: Normocephalic and atraumatic.     Right Ear: Tympanic membrane normal.     Nose: Nose normal.  Eyes:     Extraocular Movements: Extraocular movements intact.     Pupils: Pupils are equal, round, and reactive to light.  Neck:     Thyroid: No thyroid mass, thyromegaly or thyroid tenderness.     Vascular: No carotid bruit.     Trachea: Trachea normal.  Cardiovascular:     Rate and Rhythm: Normal rate and regular rhythm.     Pulses: Normal pulses.     Heart sounds: Normal heart sounds.     Comments: Regular rhythm with intermittent ectopic beats per auscultation Pulmonary:     Effort: Pulmonary effort is normal.     Breath sounds: Normal breath sounds.  Chest:     Chest wall: No mass, lacerations or deformity.     Breasts: Tanner Score is 5.        Right: Normal.        Left: Normal.  Abdominal:     General: Bowel sounds are normal.     Palpations: Abdomen is soft.     Tenderness: There is no abdominal tenderness. There is no right CVA tenderness or left CVA tenderness.  Musculoskeletal:     Cervical back: Normal range of motion and neck supple.     Right lower leg: No edema.     Left lower leg: No edema.  Lymphadenopathy:     Upper Body:     Right upper body: No axillary or pectoral adenopathy.     Left upper body: No axillary or pectoral adenopathy.  Skin:    General: Skin is warm and dry.     Capillary Refill: Capillary refill takes 2 to 3 seconds.  Neurological:     Mental Status: She is alert and oriented to person, place, and time.     Deep Tendon Reflexes:     Reflex Scores:      Patellar reflexes are 1+ on the right side and 1+ on the left side. Psychiatric:        Attention and Perception: Attention and perception normal.        Mood and Affect: Affect normal. Mood is depressed.        Speech: Speech normal.        Behavior: Behavior normal. Behavior is cooperative.        Thought Content: Thought content normal.         Cognition and Memory: Cognition and memory normal.        Judgment: Judgment normal.    Assessment/Plan: 1. Weakness Check labs for further evaluation.   2. Other fatigue Check labs for further evaluation.   3. Anemia in stage 4 chronic kidney disease ([place]) Check blood count with full anemia panel for further evaluation.   4. Left carotid bruit Carotid doppler ordered for further evaluation. - US Carotid Bilateral; Future  5. Decreased appetite Start mirtazapine 7.5mg   at bedtime to help with depression/anxiety and improve appetite.  - mirtazapine (REMERON) 7.5 MG tablet; Take 1 tablet (7.5 mg total) by mouth at bedtime. (Patient not taking: Reported on 07/20/2020)  Dispense: 30 tablet; Refill: 3  6. Depression, major, single episode, moderate (HCC) Start mirtazapine 7.5mg  at bedtime to help with depression/anxiety and improve appetite - mirtazapine (REMERON) 7.5 MG tablet; Take 1 tablet (7.5 mg total) by mouth at bedtime. (Patient not taking: Reported on 07/20/2020)  Dispense: 30 tablet; Refill: 3  General Counseling: Kristen Francis verbalizes understanding of the findings of todays visit and agrees with plan of treatment. I have discussed any further diagnostic evaluation that may be needed or ordered today. We also reviewed her medications today. she has been encouraged to call the office with any questions or concerns that should arise related to todays visit.    Counseling:  This patient was seen by Leretha Pol FNP Collaboration with Dr Lavera Guise as a part of collaborative care agreement  Orders Placed This Encounter  Procedures  . US Carotid Bilateral    Meds ordered this encounter  Medications  . mirtazapine (REMERON) 7.5 MG tablet    Sig: Take 1 tablet (7.5 mg total) by mouth at bedtime.    Dispense:  30 tablet    Refill:  3    Order Specific Question:   Supervising Provider    Answer:   Lavera Guise [[id number]]    Time spent: 30 Minutes

## 2020-07-02 ENCOUNTER — Other Ambulatory Visit: Payer: Self-pay | Admitting: Nurse Practitioner

## 2020-07-02 DIAGNOSIS — D509 Iron deficiency anemia, unspecified: Secondary | ICD-10-CM | POA: Diagnosis not present

## 2020-07-02 DIAGNOSIS — D510 Vitamin B12 deficiency anemia due to intrinsic factor deficiency: Secondary | ICD-10-CM | POA: Diagnosis not present

## 2020-07-02 DIAGNOSIS — D631 Anemia in chronic kidney disease: Secondary | ICD-10-CM | POA: Diagnosis not present

## 2020-07-02 DIAGNOSIS — D689 Coagulation defect, unspecified: Secondary | ICD-10-CM | POA: Diagnosis not present

## 2020-07-02 DIAGNOSIS — Z992 Dependence on renal dialysis: Secondary | ICD-10-CM | POA: Diagnosis not present

## 2020-07-02 DIAGNOSIS — N2581 Secondary hyperparathyroidism of renal origin: Secondary | ICD-10-CM | POA: Diagnosis not present

## 2020-07-02 DIAGNOSIS — R5383 Other fatigue: Secondary | ICD-10-CM | POA: Diagnosis not present

## 2020-07-02 DIAGNOSIS — N186 End stage renal disease: Secondary | ICD-10-CM | POA: Diagnosis not present

## 2020-07-02 DIAGNOSIS — R531 Weakness: Secondary | ICD-10-CM | POA: Diagnosis not present

## 2020-07-02 DIAGNOSIS — D528 Other folate deficiency anemias: Secondary | ICD-10-CM | POA: Diagnosis not present

## 2020-07-02 DIAGNOSIS — N184 Chronic kidney disease, stage 4 (severe): Secondary | ICD-10-CM | POA: Diagnosis not present

## 2020-07-03 DIAGNOSIS — N186 End stage renal disease: Secondary | ICD-10-CM | POA: Diagnosis not present

## 2020-07-03 DIAGNOSIS — Z992 Dependence on renal dialysis: Secondary | ICD-10-CM | POA: Diagnosis not present

## 2020-07-03 DIAGNOSIS — N040 Nephrotic syndrome with minor glomerular abnormality: Secondary | ICD-10-CM | POA: Diagnosis not present

## 2020-07-03 LAB — FERRITIN: Ferritin: 1181 ng/mL — ABNORMAL HIGH (ref 15–150)

## 2020-07-03 LAB — CBC
Hematocrit: 26.9 % — ABNORMAL LOW (ref 34.0–46.6)
Hemoglobin: 9.2 g/dL — ABNORMAL LOW (ref 11.1–15.9)
MCH: 28.6 pg (ref 26.6–33.0)
MCHC: 34.2 g/dL (ref 31.5–35.7)
MCV: 84 fL (ref 79–97)
Platelets: 207 10*3/uL (ref 150–450)
RBC: 3.22 x10E6/uL — ABNORMAL LOW (ref 3.77–5.28)
RDW: 14.8 % (ref 11.7–15.4)
WBC: 3.9 10*3/uL (ref 3.4–10.8)

## 2020-07-03 LAB — IRON AND TIBC
Iron Saturation: 22 % (ref 15–55)
Iron: 44 ug/dL (ref 27–139)
Total Iron Binding Capacity: 202 ug/dL — ABNORMAL LOW (ref 250–450)
UIBC: 158 ug/dL (ref 118–369)

## 2020-07-03 LAB — COMPREHENSIVE METABOLIC PANEL
ALT: 9 IU/L (ref 0–32)
AST: 13 IU/L (ref 0–40)
Albumin/Globulin Ratio: 1.5 (ref 1.2–2.2)
Albumin: 4 g/dL (ref 3.7–4.7)
Alkaline Phosphatase: 82 IU/L (ref 48–121)
BUN/Creatinine Ratio: 4 — ABNORMAL LOW (ref 12–28)
BUN: 13 mg/dL (ref 8–27)
Bilirubin Total: 0.3 mg/dL (ref 0.0–1.2)
CO2: 29 mmol/L (ref 20–29)
Calcium: 8.2 mg/dL — ABNORMAL LOW (ref 8.7–10.3)
Chloride: 97 mmol/L (ref 96–106)
Creatinine, Ser: 2.97 mg/dL — ABNORMAL HIGH (ref 0.57–1.00)
GFR calc Af Amer: 17 mL/min/{1.73_m2} — ABNORMAL LOW (ref >59)
GFR calc non Af Amer: 15 mL/min/{1.73_m2} — ABNORMAL LOW (ref >59)
Globulin, Total: 2.6 g/dL (ref 1.5–4.5)
Glucose: 81 mg/dL (ref 65–99)
Potassium: 3.5 mmol/L (ref 3.5–5.2)
Sodium: 139 mmol/L (ref 134–144)
Total Protein: 6.6 g/dL (ref 6.0–8.5)

## 2020-07-03 LAB — MAGNESIUM: Magnesium: 1.9 mg/dL (ref 1.6–2.3)

## 2020-07-03 LAB — HEPATITIS C ANTIBODY: Hep C Virus Ab: <0.1 s/co ratio (ref 0.0–0.9)

## 2020-07-03 LAB — ESTRADIOL: Estradiol: <5 pg/mL

## 2020-07-03 LAB — PHOSPHORUS: Phosphorus: 2.8 mg/dL — ABNORMAL LOW (ref 3.0–4.3)

## 2020-07-05 ENCOUNTER — Other Ambulatory Visit: Payer: Self-pay | Admitting: Internal Medicine

## 2020-07-05 ENCOUNTER — Telehealth: Payer: Self-pay

## 2020-07-05 DIAGNOSIS — R5383 Other fatigue: Secondary | ICD-10-CM

## 2020-07-05 DIAGNOSIS — D509 Iron deficiency anemia, unspecified: Secondary | ICD-10-CM | POA: Diagnosis not present

## 2020-07-05 DIAGNOSIS — D689 Coagulation defect, unspecified: Secondary | ICD-10-CM | POA: Diagnosis not present

## 2020-07-05 DIAGNOSIS — Z992 Dependence on renal dialysis: Secondary | ICD-10-CM | POA: Diagnosis not present

## 2020-07-05 DIAGNOSIS — N186 End stage renal disease: Secondary | ICD-10-CM | POA: Diagnosis not present

## 2020-07-05 DIAGNOSIS — D631 Anemia in chronic kidney disease: Secondary | ICD-10-CM | POA: Diagnosis not present

## 2020-07-05 DIAGNOSIS — R531 Weakness: Secondary | ICD-10-CM

## 2020-07-05 DIAGNOSIS — N2581 Secondary hyperparathyroidism of renal origin: Secondary | ICD-10-CM | POA: Diagnosis not present

## 2020-07-05 NOTE — Telephone Encounter (Signed)
Please have her do labs that I ordered and will call her back once results are reviewed

## 2020-07-06 ENCOUNTER — Other Ambulatory Visit: Payer: Self-pay

## 2020-07-06 NOTE — Telephone Encounter (Signed)
Called lab and add on Tsh+free T4 and b12 and folate

## 2020-07-07 DIAGNOSIS — Z992 Dependence on renal dialysis: Secondary | ICD-10-CM | POA: Diagnosis not present

## 2020-07-07 DIAGNOSIS — N2581 Secondary hyperparathyroidism of renal origin: Secondary | ICD-10-CM | POA: Diagnosis not present

## 2020-07-07 DIAGNOSIS — N186 End stage renal disease: Secondary | ICD-10-CM | POA: Diagnosis not present

## 2020-07-07 DIAGNOSIS — D509 Iron deficiency anemia, unspecified: Secondary | ICD-10-CM | POA: Diagnosis not present

## 2020-07-07 DIAGNOSIS — D631 Anemia in chronic kidney disease: Secondary | ICD-10-CM | POA: Diagnosis not present

## 2020-07-07 DIAGNOSIS — D689 Coagulation defect, unspecified: Secondary | ICD-10-CM | POA: Diagnosis not present

## 2020-07-07 LAB — TSH+FREE T4
Free T4: 1.2 ng/dL (ref 0.82–1.77)
TSH: 1.34 u[IU]/mL (ref 0.450–4.500)

## 2020-07-07 LAB — SPECIMEN STATUS REPORT

## 2020-07-07 LAB — B12 AND FOLATE PANEL
Folate: 9.3 ng/mL (ref >3.0)
Vitamin B-12: 785 pg/mL (ref 232–1245)

## 2020-07-09 DIAGNOSIS — D509 Iron deficiency anemia, unspecified: Secondary | ICD-10-CM | POA: Diagnosis not present

## 2020-07-09 DIAGNOSIS — D689 Coagulation defect, unspecified: Secondary | ICD-10-CM | POA: Diagnosis not present

## 2020-07-09 DIAGNOSIS — Z992 Dependence on renal dialysis: Secondary | ICD-10-CM | POA: Diagnosis not present

## 2020-07-09 DIAGNOSIS — D631 Anemia in chronic kidney disease: Secondary | ICD-10-CM | POA: Diagnosis not present

## 2020-07-09 DIAGNOSIS — N186 End stage renal disease: Secondary | ICD-10-CM | POA: Diagnosis not present

## 2020-07-09 DIAGNOSIS — N2581 Secondary hyperparathyroidism of renal origin: Secondary | ICD-10-CM | POA: Diagnosis not present

## 2020-07-12 DIAGNOSIS — N2581 Secondary hyperparathyroidism of renal origin: Secondary | ICD-10-CM | POA: Diagnosis not present

## 2020-07-12 DIAGNOSIS — D631 Anemia in chronic kidney disease: Secondary | ICD-10-CM | POA: Diagnosis not present

## 2020-07-12 DIAGNOSIS — Z992 Dependence on renal dialysis: Secondary | ICD-10-CM | POA: Diagnosis not present

## 2020-07-12 DIAGNOSIS — D509 Iron deficiency anemia, unspecified: Secondary | ICD-10-CM | POA: Diagnosis not present

## 2020-07-12 DIAGNOSIS — N186 End stage renal disease: Secondary | ICD-10-CM | POA: Diagnosis not present

## 2020-07-12 DIAGNOSIS — D689 Coagulation defect, unspecified: Secondary | ICD-10-CM | POA: Diagnosis not present

## 2020-07-13 NOTE — Telephone Encounter (Signed)
Can you please send her referral

## 2020-07-13 NOTE — Telephone Encounter (Signed)
Pt advised by desha and faxed lab to Dr Smith Mince [phone]

## 2020-07-13 NOTE — Telephone Encounter (Signed)
Per DFK, we need to print out her labs and fax them to her nephrologist. She has low phosphorus and very high ferritin. She is on hemodialysis and is managed by nephrology. She may need referral to hematology. Thanks.

## 2020-07-14 ENCOUNTER — Other Ambulatory Visit: Payer: Self-pay

## 2020-07-14 DIAGNOSIS — N2581 Secondary hyperparathyroidism of renal origin: Secondary | ICD-10-CM | POA: Diagnosis not present

## 2020-07-14 DIAGNOSIS — D689 Coagulation defect, unspecified: Secondary | ICD-10-CM | POA: Diagnosis not present

## 2020-07-14 DIAGNOSIS — D631 Anemia in chronic kidney disease: Secondary | ICD-10-CM | POA: Diagnosis not present

## 2020-07-14 DIAGNOSIS — Z992 Dependence on renal dialysis: Secondary | ICD-10-CM | POA: Diagnosis not present

## 2020-07-14 DIAGNOSIS — N186 End stage renal disease: Secondary | ICD-10-CM | POA: Diagnosis not present

## 2020-07-14 DIAGNOSIS — D509 Iron deficiency anemia, unspecified: Secondary | ICD-10-CM | POA: Diagnosis not present

## 2020-07-16 DIAGNOSIS — D631 Anemia in chronic kidney disease: Secondary | ICD-10-CM | POA: Diagnosis not present

## 2020-07-16 DIAGNOSIS — N186 End stage renal disease: Secondary | ICD-10-CM | POA: Diagnosis not present

## 2020-07-16 DIAGNOSIS — D509 Iron deficiency anemia, unspecified: Secondary | ICD-10-CM | POA: Diagnosis not present

## 2020-07-16 DIAGNOSIS — N2581 Secondary hyperparathyroidism of renal origin: Secondary | ICD-10-CM | POA: Diagnosis not present

## 2020-07-16 DIAGNOSIS — D689 Coagulation defect, unspecified: Secondary | ICD-10-CM | POA: Diagnosis not present

## 2020-07-16 DIAGNOSIS — Z992 Dependence on renal dialysis: Secondary | ICD-10-CM | POA: Diagnosis not present

## 2020-07-19 DIAGNOSIS — D509 Iron deficiency anemia, unspecified: Secondary | ICD-10-CM | POA: Diagnosis not present

## 2020-07-19 DIAGNOSIS — D689 Coagulation defect, unspecified: Secondary | ICD-10-CM | POA: Diagnosis not present

## 2020-07-19 DIAGNOSIS — N2581 Secondary hyperparathyroidism of renal origin: Secondary | ICD-10-CM | POA: Diagnosis not present

## 2020-07-19 DIAGNOSIS — D631 Anemia in chronic kidney disease: Secondary | ICD-10-CM | POA: Diagnosis not present

## 2020-07-19 DIAGNOSIS — Z992 Dependence on renal dialysis: Secondary | ICD-10-CM | POA: Diagnosis not present

## 2020-07-19 DIAGNOSIS — N186 End stage renal disease: Secondary | ICD-10-CM | POA: Diagnosis not present

## 2020-07-20 ENCOUNTER — Encounter: Payer: Self-pay | Admitting: Internal Medicine

## 2020-07-20 ENCOUNTER — Inpatient Hospital Stay: Payer: Medicare Other | Attending: Internal Medicine | Admitting: Internal Medicine

## 2020-07-20 ENCOUNTER — Other Ambulatory Visit: Payer: Self-pay

## 2020-07-20 ENCOUNTER — Inpatient Hospital Stay: Payer: Medicare Other

## 2020-07-20 VITALS — BP 142/65 | HR 71 | Temp 98.1°F | Resp 16 | Ht 66.0 in | Wt 124.8 lb

## 2020-07-20 DIAGNOSIS — E785 Hyperlipidemia, unspecified: Secondary | ICD-10-CM | POA: Insufficient documentation

## 2020-07-20 DIAGNOSIS — I120 Hypertensive chronic kidney disease with stage 5 chronic kidney disease or end stage renal disease: Secondary | ICD-10-CM | POA: Insufficient documentation

## 2020-07-20 DIAGNOSIS — R5381 Other malaise: Secondary | ICD-10-CM | POA: Diagnosis not present

## 2020-07-20 DIAGNOSIS — D472 Monoclonal gammopathy: Secondary | ICD-10-CM | POA: Diagnosis not present

## 2020-07-20 DIAGNOSIS — R7989 Other specified abnormal findings of blood chemistry: Secondary | ICD-10-CM

## 2020-07-20 DIAGNOSIS — M109 Gout, unspecified: Secondary | ICD-10-CM | POA: Insufficient documentation

## 2020-07-20 DIAGNOSIS — Z87891 Personal history of nicotine dependence: Secondary | ICD-10-CM | POA: Diagnosis not present

## 2020-07-20 DIAGNOSIS — N186 End stage renal disease: Secondary | ICD-10-CM | POA: Diagnosis not present

## 2020-07-20 DIAGNOSIS — Z992 Dependence on renal dialysis: Secondary | ICD-10-CM | POA: Diagnosis not present

## 2020-07-20 DIAGNOSIS — Z79899 Other long term (current) drug therapy: Secondary | ICD-10-CM | POA: Diagnosis not present

## 2020-07-20 DIAGNOSIS — Z8673 Personal history of transient ischemic attack (TIA), and cerebral infarction without residual deficits: Secondary | ICD-10-CM | POA: Insufficient documentation

## 2020-07-20 DIAGNOSIS — R5383 Other fatigue: Secondary | ICD-10-CM | POA: Insufficient documentation

## 2020-07-20 DIAGNOSIS — D631 Anemia in chronic kidney disease: Secondary | ICD-10-CM | POA: Diagnosis not present

## 2020-07-20 DIAGNOSIS — M1990 Unspecified osteoarthritis, unspecified site: Secondary | ICD-10-CM | POA: Diagnosis not present

## 2020-07-20 LAB — COMPREHENSIVE METABOLIC PANEL
ALT: 10 U/L (ref 0–44)
AST: 15 U/L (ref 15–41)
Albumin: 3.7 g/dL (ref 3.5–5.0)
Alkaline Phosphatase: 75 U/L (ref 38–126)
Anion gap: 11 (ref 5–15)
BUN: 29 mg/dL — ABNORMAL HIGH (ref 8–23)
CO2: 32 mmol/L (ref 22–32)
Calcium: 8.4 mg/dL — ABNORMAL LOW (ref 8.9–10.3)
Chloride: 99 mmol/L (ref 98–111)
Creatinine, Ser: 5.63 mg/dL — ABNORMAL HIGH (ref 0.44–1.00)
GFR calc Af Amer: 8 mL/min — ABNORMAL LOW (ref >60)
GFR calc non Af Amer: 7 mL/min — ABNORMAL LOW (ref >60)
Glucose, Bld: 91 mg/dL (ref 70–99)
Potassium: 4.3 mmol/L (ref 3.5–5.1)
Sodium: 142 mmol/L (ref 135–145)
Total Bilirubin: 0.4 mg/dL (ref 0.3–1.2)
Total Protein: 7.6 g/dL (ref 6.5–8.1)

## 2020-07-20 LAB — CBC WITH DIFFERENTIAL/PLATELET
Abs Immature Granulocytes: 0.01 10*3/uL (ref 0.00–0.07)
Basophils Absolute: 0.1 10*3/uL (ref 0.0–0.1)
Basophils Relative: 1 %
Eosinophils Absolute: 0.1 10*3/uL (ref 0.0–0.5)
Eosinophils Relative: 3 %
HCT: 32.1 % — ABNORMAL LOW (ref 36.0–46.0)
Hemoglobin: 10.4 g/dL — ABNORMAL LOW (ref 12.0–15.0)
Immature Granulocytes: 0 %
Lymphocytes Relative: 26 %
Lymphs Abs: 1.2 10*3/uL (ref 0.7–4.0)
MCH: 28.4 pg (ref 26.0–34.0)
MCHC: 32.4 g/dL (ref 30.0–36.0)
MCV: 87.7 fL (ref 80.0–100.0)
Monocytes Absolute: 0.4 10*3/uL (ref 0.1–1.0)
Monocytes Relative: 9 %
Neutro Abs: 2.8 10*3/uL (ref 1.7–7.7)
Neutrophils Relative %: 61 %
Platelets: 226 10*3/uL (ref 150–400)
RBC: 3.66 MIL/uL — ABNORMAL LOW (ref 3.87–5.11)
RDW: 15.2 % (ref 11.5–15.5)
WBC: 4.5 10*3/uL (ref 4.0–10.5)
nRBC: 0 % (ref 0.0–0.2)

## 2020-07-20 NOTE — Progress Notes (Signed)
[place] NOTE  Patient Care Team: Lavera Guise, MD as PCP - General (Internal Medicine) Franciso Bend, MD (Nephrology)  CHIEF COMPLAINTS/PURPOSE OF CONSULTATION: elevated Iron/ferritin  # MGUS [2018 IgG kappa 0.2; Dr. Mike Gip  # CKD [feb 2019 s/p kidney Biopsy- [facility]; ESRD on HD- M/W/F; Dr.Voora]   Oncology History   No history exists.     HISTORY OF PRESENTING ILLNESS:  Kristen Francis 74 y.o.  female history of end-stage renal disease on dialysis has been referred to [place] for further evaluation recommendations for elevated ferritin.  Patient follows up with [facility] nephrology for dialysis-she also gets IV iron/erythropoietin stimulating agent for her anemia.  She has chronic mild fatigue. Not any worse. History of chronic joint pains not any worse. No history of liver disease. No family history of liver disease or cancer.   Review of Systems  Constitutional: Positive for malaise/fatigue. Negative for chills, diaphoresis, fever and weight loss.  HENT: Negative for nosebleeds and sore throat.   Eyes: Negative for double vision.  Respiratory: Negative for cough, hemoptysis, sputum production, shortness of breath and wheezing.   Cardiovascular: Negative for chest pain, palpitations, orthopnea and leg swelling.  Gastrointestinal: Negative for abdominal pain, blood in stool, constipation, diarrhea, heartburn, melena, nausea and vomiting.  Genitourinary: Negative for dysuria, frequency and urgency.  Musculoskeletal: Positive for back pain and joint pain.  Skin: Negative.  Negative for itching and rash.  Neurological: Negative for dizziness, tingling, focal weakness, weakness and headaches.  Endo/Heme/Allergies: Does not bruise/bleed easily.  Psychiatric/Behavioral: Negative for depression. The patient is not nervous/anxious and does not have insomnia.      MEDICAL HISTORY:  Past Medical History:  Diagnosis Date   Anemia    Arthritis    Chronic  kidney disease    ESRD   Complication of anesthesia    had procedure and felt incision early 80s   Gout    Hyperlipemia    Hypertension    Macular degeneration, right eye    Stroke [facility])     SURGICAL HISTORY: Past Surgical History:  Procedure Laterality Date   A/V FISTULAGRAM Left 12/25/2018   Procedure: A/V FISTULAGRAM;  Surgeon: Katha Cabal, MD;  Location: [place] [facility];  Service: Cardiovascular;  Laterality: Left;   ABDOMINAL HYSTERECTOMY     APPENDECTOMY     AV FISTULA PLACEMENT Left 07/12/2018   Procedure: INSERTION OF ARTERIOVENOUS (AV) GORE-TEX GRAFT ARM ( BRACHIAL AXILLARY );  Surgeon: Katha Cabal, MD;  Location: [facility] ORS;  Service: Vascular;  Laterality: Left;   COLONOSCOPY WITH PROPOFOL N/A 01/14/2016   Procedure: COLONOSCOPY WITH PROPOFOL;  Surgeon: Manya Silvas, MD;  Location: [facility] ENDOSCOPY;  Service: Endoscopy;  Laterality: N/A;   TONSILLECTOMY      SOCIAL HISTORY: Social History   Socioeconomic History   Marital status: Married    Spouse name: Not on file   Number of children: Not on file   Years of education: Not on file   Highest education level: Not on file  Occupational History   Not on file  Tobacco Use   Smoking status: Former Smoker   Smokeless tobacco: Never Used  Scientific laboratory technician Use: Never used  Substance and Sexual Activity   Alcohol use: No   Drug use: No   Sexual activity: Not on file  Other Topics Concern   Not on file  Social History Narrative   Lives in  [place]; used to clean; no smoking; no  alcohol.    Social Determinants of Health   Financial Resource Strain:    Difficulty of Paying Living Expenses:   Food Insecurity:    Worried About Charity fundraiser in the Last Year:    Arboriculturist in the Last Year:   Transportation Needs:    Film/video editor (Medical):    Lack of Transportation (Non-Medical):   Physical Activity:    Days of Exercise per Week:     Minutes of Exercise per Session:   Stress:    Feeling of Stress :   Social Connections:    Frequency of Communication with Friends and Family:    Frequency of Social Gatherings with Friends and Family:    Attends Religious Services:    Active Member of Clubs or Organizations:    Attends Music therapist:    Marital Status:   Intimate Partner Violence:    Fear of Current or Ex-Partner:    Emotionally Abused:    Physically Abused:    Sexually Abused:     FAMILY HISTORY: Family History  Problem Relation Age of Onset   Kidney disease Mother     ALLERGIES:  is allergic to other, penicillins, and sodium bicarbonate.  MEDICATIONS:  Current Outpatient Medications  Medication Sig Dispense Refill   allopurinol (ZYLOPRIM) 100 MG tablet Take 1 tablet (100 mg total) by mouth daily. 30 tablet 1   amLODipine (NORVASC) 10 MG tablet Take 1 tablet (10 mg total) by mouth daily. 30 tablet 5   b complex-vitamin c-folic acid (NEPHRO-VITE) 0.8 MG TABS tablet TAKE 1 TABLET BY MOUTH EVERY DAY (ON DIALYSIS DAYS, TAKE AFTER DIALYSIS TREATMENT)  4   carvedilol (COREG) 25 MG tablet Take 25 mg by mouth 2 (two) times daily with a meal.     cholecalciferol (VITAMIN D) 1000 units tablet Take 1,000 Units by mouth daily.      ferrous sulfate 325 (65 FE) MG tablet Take 325 mg by mouth daily.      lidocaine-prilocaine (EMLA) cream APPLY SMALL AMOUNT TO ACCESS SITE (AVF) 3 TIMES A WEEK 1 HOUR BEFORE DIALYSIS. COVER WITH OCCLUSIVE DRESSING (SARAN WRAP)  6   losartan (COZAAR) 50 MG tablet Take 50 mg by mouth daily.     torsemide (DEMADEX) 5 MG tablet Take 5 mg by mouth daily.   11   mirtazapine (REMERON) 7.5 MG tablet Take 1 tablet (7.5 mg total) by mouth at bedtime. (Patient not taking: Reported on 07/20/2020) 30 tablet 3   No current facility-administered medications for this visit.      Marland Kitchen  PHYSICAL EXAMINATION:   Vitals:   07/20/20 1109  BP: (!) 142/65  Pulse: 71   Resp: 16  Temp: 98.1 F (36.7 C)  SpO2: 100%   Filed Weights   07/20/20 1109  Weight: 124 lb 12.8 oz (56.6 kg)    Physical Exam Constitutional:      Comments: Ambulating independently. Accompanied by family.  HENT:     Head: Normocephalic and atraumatic.     Mouth/Throat:     Pharynx: No oropharyngeal exudate.  Eyes:     Pupils: Pupils are equal, round, and reactive to light.  Cardiovascular:     Rate and Rhythm: Normal rate and regular rhythm.  Pulmonary:     Effort: Pulmonary effort is normal. No respiratory distress.     Breath sounds: Normal breath sounds. No wheezing.  Abdominal:     General: Bowel sounds are normal. There is no distension.  Palpations: Abdomen is soft. There is no mass.     Tenderness: There is no abdominal tenderness. There is no guarding or rebound.  Musculoskeletal:        General: No tenderness. Normal range of motion.     Cervical back: Normal range of motion and neck supple.  Skin:    General: Skin is warm.  Neurological:     Mental Status: She is alert and oriented to person, place, and time.  Psychiatric:        Mood and Affect: Affect normal.      LABORATORY DATA:  I have reviewed the data as listed Lab Results  Component Value Date   WBC 4.5 07/20/2020   HGB 10.4 (L) 07/20/2020   HCT 32.1 (L) 07/20/2020   MCV 87.7 07/20/2020   PLT 226 07/20/2020   Recent Labs    07/02/20 1105 07/20/20 1202  NA 139 142  K 3.5 4.3  CL 97 99  CO2 29 32  GLUCOSE 81 91  BUN 13 29*  CREATININE 2.97* 5.63*  CALCIUM 8.2* 8.4*  GFRNONAA 15* 7*  GFRAA 17* 8*  PROT 6.6 7.6  ALBUMIN 4.0 3.7  AST 13 15  ALT 9 10  ALKPHOS 82 75  BILITOT 0.3 0.4    RADIOGRAPHIC STUDIES: I have personally reviewed the radiological images as listed and agreed with the findings in the report. No results found.  ASSESSMENT & PLAN:   Elevated ferritin #Elevated ferritin- 1128; iron saturation 28% [PCP]-likely secondary to likely recent iron  infusion/reactive; do not suspect hemochromatosis.  No concerns for organ involvement.  Will check hemochromatosis genotyping.  #Anemia chronic kidney disease/ESRD-Procrit/iron as per nephrology  # MGUS-M protein-0.2 [3557]- IgGK; will repeat myeloma panel at this time. # MGUS-long discussion with the patient regarding natural history of MGUS; small risk of progression to multiple myeloma. Discussed the risk of transformation to multiple myeloma is about 1 %/year.   # DISPOSITION: will [phone]/ pt cell  # labs today-ordered today # follow up in 1 year- MD; 1 week prior- labs-cbc/cmp/MM panel; K/llight chain- Dr.B  Thank you Dr.Khan for allowing me to participate in the care of your pleasant patient. Please do not hesitate to contact me with questions or concerns in the interim.  Cc; Dr.Voora/Khan.    All questions were answered. The patient knows to call the clinic with any problems, questions or concerns.    Cammie Sickle, MD 07/21/2020 2:51 PM

## 2020-07-20 NOTE — Assessment & Plan Note (Addendum)
#  Elevated ferritin- 1128; iron saturation 28% [PCP]-likely secondary to likely recent iron infusion/reactive; do not suspect hemochromatosis.  No concerns for organ involvement.  Will check hemochromatosis genotyping.  #Anemia chronic kidney disease/ESRD-Procrit/iron as per nephrology  # MGUS-M protein-0.2 [6629]- IgGK; will repeat myeloma panel at this time. # MGUS-long discussion with the patient regarding natural history of MGUS; small risk of progression to multiple myeloma. Discussed the risk of transformation to multiple myeloma is about 1 %/year.   # DISPOSITION: will [phone]/ pt cell  # labs today-ordered today # follow up in 1 year- MD; 1 week prior- labs-cbc/cmp/MM panel; K/llight chain- Dr.B  Thank you Dr.Khan for allowing me to participate in the care of your pleasant patient. Please do not hesitate to contact me with questions or concerns in the interim.  Cc; Dr.Voora/Khan.   Addendum: Hemochromatosis genotyping-negative.  Elevated ferritin from iron infusions.  #August 2021-M protein negative; kappa lambda light chain ratio slightly abnormal.  Recommend repeat monitoring in 1 year.  Results for ARES, TEGTMEYER (MRN [id number]) as of 07/27/2020 08:23  Ref. Range 09/17/2017 15:11 07/20/2020 12:02  Kappa free light chain Latest Ref Range: 3.3 - 19.4 mg/L 102.3 (H) 210.2 (H)  Lamda free light chains Latest Ref Range: 5.7 - 26.3 mg/L 49.5 (H) 92.2 (H)  Kappa, lamda light chain ratio Latest Ref Range: 0.26 - 1.65  2.07 (H) 2.28 (H)

## 2020-07-21 DIAGNOSIS — Z992 Dependence on renal dialysis: Secondary | ICD-10-CM | POA: Diagnosis not present

## 2020-07-21 DIAGNOSIS — N2581 Secondary hyperparathyroidism of renal origin: Secondary | ICD-10-CM | POA: Diagnosis not present

## 2020-07-21 DIAGNOSIS — D631 Anemia in chronic kidney disease: Secondary | ICD-10-CM | POA: Diagnosis not present

## 2020-07-21 DIAGNOSIS — D509 Iron deficiency anemia, unspecified: Secondary | ICD-10-CM | POA: Diagnosis not present

## 2020-07-21 DIAGNOSIS — N186 End stage renal disease: Secondary | ICD-10-CM | POA: Diagnosis not present

## 2020-07-21 DIAGNOSIS — D689 Coagulation defect, unspecified: Secondary | ICD-10-CM | POA: Diagnosis not present

## 2020-07-21 LAB — KAPPA/LAMBDA LIGHT CHAINS
Kappa free light chain: 210.2 mg/L — ABNORMAL HIGH (ref 3.3–19.4)
Kappa, lambda light chain ratio: 2.28 — ABNORMAL HIGH (ref 0.26–1.65)
Lambda free light chains: 92.2 mg/L — ABNORMAL HIGH (ref 5.7–26.3)

## 2020-07-23 LAB — MULTIPLE MYELOMA PANEL, SERUM
Albumin SerPl Elph-Mcnc: 3.8 g/dL (ref 2.9–4.4)
Albumin/Glob SerPl: 1.2 (ref 0.7–1.7)
Alpha 1: 0.2 g/dL (ref 0.0–0.4)
Alpha2 Glob SerPl Elph-Mcnc: 0.7 g/dL (ref 0.4–1.0)
B-Globulin SerPl Elph-Mcnc: 1 g/dL (ref 0.7–1.3)
Gamma Glob SerPl Elph-Mcnc: 1.2 g/dL (ref 0.4–1.8)
Globulin, Total: 3.2 g/dL (ref 2.2–3.9)
IgA: 197 mg/dL (ref 64–422)
IgG (Immunoglobin G), Serum: 1283 mg/dL (ref 586–1602)
IgM (Immunoglobulin M), Srm: 58 mg/dL (ref 26–217)
Total Protein ELP: 7 g/dL (ref 6.0–8.5)

## 2020-07-25 DIAGNOSIS — F321 Major depressive disorder, single episode, moderate: Secondary | ICD-10-CM | POA: Insufficient documentation

## 2020-07-25 DIAGNOSIS — R5383 Other fatigue: Secondary | ICD-10-CM | POA: Insufficient documentation

## 2020-07-25 DIAGNOSIS — R0989 Other specified symptoms and signs involving the circulatory and respiratory systems: Secondary | ICD-10-CM | POA: Insufficient documentation

## 2020-07-25 DIAGNOSIS — R630 Anorexia: Secondary | ICD-10-CM | POA: Insufficient documentation

## 2020-07-26 DIAGNOSIS — D631 Anemia in chronic kidney disease: Secondary | ICD-10-CM | POA: Diagnosis not present

## 2020-07-26 DIAGNOSIS — Z992 Dependence on renal dialysis: Secondary | ICD-10-CM | POA: Diagnosis not present

## 2020-07-26 DIAGNOSIS — N2581 Secondary hyperparathyroidism of renal origin: Secondary | ICD-10-CM | POA: Diagnosis not present

## 2020-07-26 DIAGNOSIS — D689 Coagulation defect, unspecified: Secondary | ICD-10-CM | POA: Diagnosis not present

## 2020-07-26 DIAGNOSIS — N186 End stage renal disease: Secondary | ICD-10-CM | POA: Diagnosis not present

## 2020-07-26 DIAGNOSIS — D509 Iron deficiency anemia, unspecified: Secondary | ICD-10-CM | POA: Diagnosis not present

## 2020-07-26 LAB — HEMOCHROMATOSIS DNA-PCR(C282Y,H63D)

## 2020-07-27 ENCOUNTER — Telehealth: Payer: Self-pay | Admitting: Internal Medicine

## 2020-07-27 NOTE — Telephone Encounter (Signed)
Spoke to patient regarding results of her blood work-MGUS; genotyping for hemochromatosis negative.  Routed the note to PCP/nephrology as per patient's preference.  Follow-up as planned

## 2020-07-27 NOTE — Telephone Encounter (Signed)
Notes routed to pcp and nephrology at [facility]

## 2020-07-28 DIAGNOSIS — Z992 Dependence on renal dialysis: Secondary | ICD-10-CM | POA: Diagnosis not present

## 2020-07-28 DIAGNOSIS — D509 Iron deficiency anemia, unspecified: Secondary | ICD-10-CM | POA: Diagnosis not present

## 2020-07-28 DIAGNOSIS — N2581 Secondary hyperparathyroidism of renal origin: Secondary | ICD-10-CM | POA: Diagnosis not present

## 2020-07-28 DIAGNOSIS — D689 Coagulation defect, unspecified: Secondary | ICD-10-CM | POA: Diagnosis not present

## 2020-07-28 DIAGNOSIS — D631 Anemia in chronic kidney disease: Secondary | ICD-10-CM | POA: Diagnosis not present

## 2020-07-28 DIAGNOSIS — N186 End stage renal disease: Secondary | ICD-10-CM | POA: Diagnosis not present

## 2020-07-29 ENCOUNTER — Other Ambulatory Visit: Payer: Self-pay

## 2020-07-29 MED ORDER — AMLODIPINE BESYLATE 10 MG PO TABS: 10.0000 mg | ORAL_TABLET | Freq: Every day | ORAL | 5 refills | Status: DC

## 2020-07-30 ENCOUNTER — Other Ambulatory Visit: Payer: Self-pay

## 2020-07-30 ENCOUNTER — Ambulatory Visit: Payer: Medicare Other

## 2020-07-30 DIAGNOSIS — D631 Anemia in chronic kidney disease: Secondary | ICD-10-CM | POA: Diagnosis not present

## 2020-07-30 DIAGNOSIS — R0989 Other specified symptoms and signs involving the circulatory and respiratory systems: Secondary | ICD-10-CM

## 2020-07-30 DIAGNOSIS — I6523 Occlusion and stenosis of bilateral carotid arteries: Secondary | ICD-10-CM

## 2020-07-30 DIAGNOSIS — D509 Iron deficiency anemia, unspecified: Secondary | ICD-10-CM | POA: Diagnosis not present

## 2020-07-30 DIAGNOSIS — Z992 Dependence on renal dialysis: Secondary | ICD-10-CM | POA: Diagnosis not present

## 2020-07-30 DIAGNOSIS — D689 Coagulation defect, unspecified: Secondary | ICD-10-CM | POA: Diagnosis not present

## 2020-07-30 DIAGNOSIS — N186 End stage renal disease: Secondary | ICD-10-CM | POA: Diagnosis not present

## 2020-07-30 DIAGNOSIS — N2581 Secondary hyperparathyroidism of renal origin: Secondary | ICD-10-CM | POA: Diagnosis not present

## 2020-08-02 DIAGNOSIS — N2581 Secondary hyperparathyroidism of renal origin: Secondary | ICD-10-CM | POA: Diagnosis not present

## 2020-08-02 DIAGNOSIS — D689 Coagulation defect, unspecified: Secondary | ICD-10-CM | POA: Diagnosis not present

## 2020-08-02 DIAGNOSIS — D631 Anemia in chronic kidney disease: Secondary | ICD-10-CM | POA: Diagnosis not present

## 2020-08-02 DIAGNOSIS — Z992 Dependence on renal dialysis: Secondary | ICD-10-CM | POA: Diagnosis not present

## 2020-08-02 DIAGNOSIS — D509 Iron deficiency anemia, unspecified: Secondary | ICD-10-CM | POA: Diagnosis not present

## 2020-08-02 DIAGNOSIS — N186 End stage renal disease: Secondary | ICD-10-CM | POA: Diagnosis not present

## 2020-08-03 ENCOUNTER — Ambulatory Visit (INDEPENDENT_AMBULATORY_CARE_PROVIDER_SITE_OTHER): Payer: Medicare Other | Admitting: Nurse Practitioner

## 2020-08-03 ENCOUNTER — Ambulatory Visit (INDEPENDENT_AMBULATORY_CARE_PROVIDER_SITE_OTHER): Payer: Medicare Other

## 2020-08-03 ENCOUNTER — Encounter (INDEPENDENT_AMBULATORY_CARE_PROVIDER_SITE_OTHER): Payer: Self-pay | Admitting: Nurse Practitioner

## 2020-08-03 ENCOUNTER — Other Ambulatory Visit (INDEPENDENT_AMBULATORY_CARE_PROVIDER_SITE_OTHER): Payer: Self-pay | Admitting: Nurse Practitioner

## 2020-08-03 ENCOUNTER — Other Ambulatory Visit: Payer: Self-pay

## 2020-08-03 VITALS — BP 163/71 | HR 68 | Ht 63.0 in | Wt 124.0 lb

## 2020-08-03 DIAGNOSIS — N186 End stage renal disease: Secondary | ICD-10-CM | POA: Diagnosis not present

## 2020-08-03 DIAGNOSIS — I10 Essential (primary) hypertension: Secondary | ICD-10-CM | POA: Diagnosis not present

## 2020-08-03 DIAGNOSIS — R1084 Generalized abdominal pain: Secondary | ICD-10-CM

## 2020-08-03 DIAGNOSIS — Z992 Dependence on renal dialysis: Secondary | ICD-10-CM | POA: Diagnosis not present

## 2020-08-03 DIAGNOSIS — N040 Nephrotic syndrome with minor glomerular abnormality: Secondary | ICD-10-CM | POA: Diagnosis not present

## 2020-08-03 DIAGNOSIS — T829XXS Unspecified complication of cardiac and vascular prosthetic device, implant and graft, sequela: Secondary | ICD-10-CM

## 2020-08-03 NOTE — Progress Notes (Signed)
Subjective:    Patient ID: Kristen Francis, female    DOB: [birth year], 74 y.o.   MRN: [id number] Chief Complaint  Patient presents with  . Follow-up    est. voora. post  bleeding     The patient returns to the office for followup of their dialysis access. The function of the access has been stable. The patient endorses having some increased bleeding times however only occasional peeling.  She notes that she may have to hold her access for an additional 5 minutes or so.  The patient denies noticing excessive alarms on her dialysis machine.  The referral for the dialysis center notes that she has been having some inconsistent arterial pressures.  Patient denies difficulty with cannulation. The patient denies hand pain or other symptoms consistent with steal phenomena.  No significant arm swelling.  The patient denies redness or swelling at the access site. The patient denies fever or chills at home or while on dialysis.  The patient denies amaurosis fugax or recent TIA symptoms. There are no recent neurological changes noted. The patient denies claudication symptoms or rest pain symptoms. The patient denies history of DVT, PE or superficial thrombophlebitis. The patient denies recent episodes of angina or shortness of breath.   The patient is also concerned about consistent abdominal pain that she has been having recently.  She notes that consistently on her dialysis days she begins to have severe abdominal pain and is barely able to eat.  She does eat she has worsening of her abdominal pain in addition to nausea and vomiting.  On her nondialysis days she does not have this issue.  The patient also notes that she has been losing weight excessively over the last few months, upwards of about 40 pounds or so.  She denies any fever, chills, nausea, vomiting or diarrhea.  Today noninvasive studies show flow volume of 1918.  The graft does have an aneurysmal segment of 1.05 cm x 1.24 cm with a  stricture beyond the mid to distal point with elevated velocities.      Review of Systems  Gastrointestinal: Positive for abdominal pain.  Hematological: Bruises/bleeds easily.       Objective:   Physical Exam Vitals reviewed.  HENT:     Head: Normocephalic.  Cardiovascular:     Rate and Rhythm: Normal rate and regular rhythm.     Pulses: Normal pulses.          Radial pulses are 2+ on the right side and 2+ on the left side.     Arteriovenous access: left arteriovenous access is present.    Comments: Good thrill and bruit of left brachial ax Pulmonary:     Effort: Pulmonary effort is normal.  Abdominal:     General: There is no distension.     Tenderness: There is no abdominal tenderness. There is no rebound.  Musculoskeletal:        General: Normal range of motion.  Skin:    General: Skin is warm and dry.  Neurological:     Mental Status: She is alert and oriented to person, place, and time.  Psychiatric:        Mood and Affect: Mood normal.        Behavior: Behavior normal.        Thought Content: Thought content normal.        Judgment: Judgment normal.     BP (!) 163/71   Pulse 68   Ht 5\' 3"  (1.6 m)  Wt 124 lb (56.2 kg)   BMI 21.97 kg/m   Past Medical History:  Diagnosis Date  . Anemia   . Arthritis   . Chronic kidney disease    ESRD  . Complication of anesthesia    had procedure and felt incision early 80s  . Gout   . Hyperlipemia   . Hypertension   . Macular degeneration, right eye   . Stroke [facility])     Social History   Socioeconomic History  . Marital status: Married    Spouse name: Not on file  . Number of children: Not on file  . Years of education: Not on file  . Highest education level: Not on file  Occupational History  . Not on file  Tobacco Use  . Smoking status: Former Research scientist (life sciences)  . Smokeless tobacco: Never Used  Vaping Use  . Vaping Use: Never used  Substance and Sexual Activity  . Alcohol use: No  . Drug use: No  . Sexual  activity: Not on file  Other Topics Concern  . Not on file  Social History Narrative   Lives in  [place]; used to clean; no smoking; no alcohol.    Social Determinants of Health   Financial Resource Strain:   . Difficulty of Paying Living Expenses: Not on file  Food Insecurity:   . Worried About Charity fundraiser in the Last Year: Not on file  . Ran Out of Food in the Last Year: Not on file  Transportation Needs:   . Lack of Transportation (Medical): Not on file  . Lack of Transportation (Non-Medical): Not on file  Physical Activity:   . Days of Exercise per Week: Not on file  . Minutes of Exercise per Session: Not on file  Stress:   . Feeling of Stress : Not on file  Social Connections:   . Frequency of Communication with Friends and Family: Not on file  . Frequency of Social Gatherings with Friends and Family: Not on file  . Attends Religious Services: Not on file  . Active Member of Clubs or Organizations: Not on file  . Attends Archivist Meetings: Not on file  . Marital Status: Not on file  Intimate Partner Violence:   . Fear of Current or Ex-Partner: Not on file  . Emotionally Abused: Not on file  . Physically Abused: Not on file  . Sexually Abused: Not on file    Past Surgical History:  Procedure Laterality Date  . A/V FISTULAGRAM Left 12/25/2018   Procedure: A/V FISTULAGRAM;  Surgeon: Katha Cabal, MD;  Location: [facility];  Service: Cardiovascular;  Laterality: Left;  . ABDOMINAL HYSTERECTOMY    . APPENDECTOMY    . AV FISTULA PLACEMENT Left 07/12/2018   Procedure: INSERTION OF ARTERIOVENOUS (AV) GORE-TEX GRAFT ARM ( BRACHIAL AXILLARY );  Surgeon: Katha Cabal, MD;  Location: [facility] ORS;  Service: Vascular;  Laterality: Left;  . COLONOSCOPY WITH PROPOFOL N/A 01/14/2016   Procedure: COLONOSCOPY WITH PROPOFOL;  Surgeon: Manya Silvas, MD;  [facility];  Service: Endoscopy;  Laterality: N/A;  . TONSILLECTOMY       Family History  Problem Relation Age of Onset  . Kidney disease Mother     Allergies  Allergen Reactions  . Other Other (See Comments)    Pt does not receive Blood   . Penicillins Hives    Has patient had a PCN reaction causing immediate rash, facial/tongue/throat swelling, SOB or lightheadedness with hypotension: YES  Has patient had a PCN reaction causing severe rash involving mucus membranes or skin necrosis: no Has patient had a PCN reaction that required hospitalization: no Has patient had a PCN reaction occurring within the last 10 years: no If all of the above answers are "NO", then may proceed with Cephalosporin use.   . Sodium Bicarbonate Itching       Assessment & Plan:   1. Generalized abdominal pain Patient has complaints of abdominal pain consistently following dialysis.  Although the symptoms do not consistently aligned with mesenteric stenosis, it is concerning that after dialysis the symptoms recur.  Based on this, out of an abundance of precaution we will check a mesenteric artery duplex to ensure that there are no vascular abnormalities causing the patient's weight loss and abdominal pain.  If these tests are unrevealing, will refer patient to gastroenterology for further work-up. - VAS [place] MESENTERIC; Future  2. End stage renal disease ([place]) The patient does have some evidence of stenosis in her AV fistula.  She notes that the bleeding is not consistent and the issues with dialysis only occur with certain technicians.  We will inquire about how the patient's dialysis access is functioning at her follow-up visit for mesenteric duplex.  If the patient is continuing to have issues, we will proceed with fistulogram however if there are no issues we will continue with routine follow-up.  3. Essential hypertension Continue antihypertensive medications as already ordered, these medications have been reviewed and there are no changes at this time.    Current  Outpatient Medications on File Prior to Visit  Medication Sig Dispense Refill  . allopurinol (ZYLOPRIM) 100 MG tablet Take 1 tablet (100 mg total) by mouth daily. 30 tablet 1  . amLODipine (NORVASC) 10 MG tablet Take by mouth.    . B Complex-C-Folic Acid (RENAL-VITE) 0.8 MG TABS Take by mouth.    . carvedilol (COREG) 25 MG tablet Take 25 mg by mouth 2 (two) times daily with a meal.    . cholecalciferol (VITAMIN D) 1000 units tablet Take 1,000 Units by mouth daily.     . cinacalcet (SENSIPAR) 60 MG tablet Take by mouth.    . cinacalcet (SENSIPAR) 60 MG tablet Take 60 mg by mouth daily.    . cinacalcet (SENSIPAR) 90 MG tablet Take 90 mg by mouth daily.    . ferrous sulfate 325 (65 FE) MG tablet Take 325 mg by mouth daily.     Marland Kitchen lidocaine-prilocaine (EMLA) cream APPLY SMALL AMOUNT TO ACCESS SITE (AVF) 3 TIMES A WEEK 1 HOUR BEFORE DIALYSIS. COVER WITH OCCLUSIVE DRESSING (SARAN WRAP)  6  . losartan (COZAAR) 50 MG tablet Take 50 mg by mouth daily.    . Methoxy PEG-Epoetin Beta (MIRCERA IJ) Mircera    . Methoxy PEG-Epoetin Beta (MIRCERA IJ) Mircera    . sevelamer (RENAGEL) 800 MG tablet Take by mouth.    . sevelamer carbonate (RENVELA) 800 MG tablet Take by mouth.    . sevelamer carbonate (RENVELA) 800 MG tablet Take 2,400 mg by mouth 3 (three) times daily.    Marland Kitchen torsemide (DEMADEX) 5 MG tablet Take 5 mg by mouth daily.   11  . mirtazapine (REMERON) 7.5 MG tablet Take 1 tablet (7.5 mg total) by mouth at bedtime. (Patient not taking: Reported on 07/20/2020) 30 tablet 3   No current facility-administered medications on file prior to visit.    There are no Patient Instructions on file for this visit. No follow-ups on file.  Kris Hartmann, NP

## 2020-08-04 DIAGNOSIS — N2581 Secondary hyperparathyroidism of renal origin: Secondary | ICD-10-CM | POA: Diagnosis not present

## 2020-08-04 DIAGNOSIS — N186 End stage renal disease: Secondary | ICD-10-CM | POA: Diagnosis not present

## 2020-08-04 DIAGNOSIS — D689 Coagulation defect, unspecified: Secondary | ICD-10-CM | POA: Diagnosis not present

## 2020-08-04 DIAGNOSIS — Z992 Dependence on renal dialysis: Secondary | ICD-10-CM | POA: Diagnosis not present

## 2020-08-04 DIAGNOSIS — D631 Anemia in chronic kidney disease: Secondary | ICD-10-CM | POA: Diagnosis not present

## 2020-08-04 DIAGNOSIS — D509 Iron deficiency anemia, unspecified: Secondary | ICD-10-CM | POA: Diagnosis not present

## 2020-08-05 ENCOUNTER — Other Ambulatory Visit: Payer: Self-pay

## 2020-08-05 ENCOUNTER — Ambulatory Visit
Admission: RE | Admit: 2020-08-05 | Discharge: 2020-08-05 | Disposition: A | Discharge status: Home / Self Care | Payer: Medicare Other | Source: Ambulatory Visit | Attending: Nurse Practitioner | Admitting: Nurse Practitioner

## 2020-08-05 DIAGNOSIS — Z1231 Encounter for screening mammogram for malignant neoplasm of breast: Secondary | ICD-10-CM | POA: Insufficient documentation

## 2020-08-06 DIAGNOSIS — D509 Iron deficiency anemia, unspecified: Secondary | ICD-10-CM | POA: Diagnosis not present

## 2020-08-06 DIAGNOSIS — D689 Coagulation defect, unspecified: Secondary | ICD-10-CM | POA: Diagnosis not present

## 2020-08-06 DIAGNOSIS — N186 End stage renal disease: Secondary | ICD-10-CM | POA: Diagnosis not present

## 2020-08-06 DIAGNOSIS — Z992 Dependence on renal dialysis: Secondary | ICD-10-CM | POA: Diagnosis not present

## 2020-08-06 DIAGNOSIS — D631 Anemia in chronic kidney disease: Secondary | ICD-10-CM | POA: Diagnosis not present

## 2020-08-06 DIAGNOSIS — N2581 Secondary hyperparathyroidism of renal origin: Secondary | ICD-10-CM | POA: Diagnosis not present

## 2020-08-09 DIAGNOSIS — D631 Anemia in chronic kidney disease: Secondary | ICD-10-CM | POA: Diagnosis not present

## 2020-08-09 DIAGNOSIS — N2581 Secondary hyperparathyroidism of renal origin: Secondary | ICD-10-CM | POA: Diagnosis not present

## 2020-08-09 DIAGNOSIS — N186 End stage renal disease: Secondary | ICD-10-CM | POA: Diagnosis not present

## 2020-08-09 DIAGNOSIS — Z992 Dependence on renal dialysis: Secondary | ICD-10-CM | POA: Diagnosis not present

## 2020-08-09 DIAGNOSIS — D689 Coagulation defect, unspecified: Secondary | ICD-10-CM | POA: Diagnosis not present

## 2020-08-09 DIAGNOSIS — D509 Iron deficiency anemia, unspecified: Secondary | ICD-10-CM | POA: Diagnosis not present

## 2020-08-09 NOTE — Progress Notes (Signed)
Pt needs additional views

## 2020-08-10 ENCOUNTER — Other Ambulatory Visit: Payer: Self-pay | Admitting: Nurse Practitioner

## 2020-08-10 DIAGNOSIS — N6320 Unspecified lump in the left breast, unspecified quadrant: Secondary | ICD-10-CM

## 2020-08-10 DIAGNOSIS — R928 Other abnormal and inconclusive findings on diagnostic imaging of breast: Secondary | ICD-10-CM

## 2020-08-11 DIAGNOSIS — N186 End stage renal disease: Secondary | ICD-10-CM | POA: Diagnosis not present

## 2020-08-11 DIAGNOSIS — D509 Iron deficiency anemia, unspecified: Secondary | ICD-10-CM | POA: Diagnosis not present

## 2020-08-11 DIAGNOSIS — N2581 Secondary hyperparathyroidism of renal origin: Secondary | ICD-10-CM | POA: Diagnosis not present

## 2020-08-11 DIAGNOSIS — Z992 Dependence on renal dialysis: Secondary | ICD-10-CM | POA: Diagnosis not present

## 2020-08-11 DIAGNOSIS — D689 Coagulation defect, unspecified: Secondary | ICD-10-CM | POA: Diagnosis not present

## 2020-08-11 DIAGNOSIS — D631 Anemia in chronic kidney disease: Secondary | ICD-10-CM | POA: Diagnosis not present

## 2020-08-13 ENCOUNTER — Telehealth: Payer: Self-pay

## 2020-08-13 DIAGNOSIS — N2581 Secondary hyperparathyroidism of renal origin: Secondary | ICD-10-CM | POA: Diagnosis not present

## 2020-08-13 DIAGNOSIS — D631 Anemia in chronic kidney disease: Secondary | ICD-10-CM | POA: Diagnosis not present

## 2020-08-13 DIAGNOSIS — N186 End stage renal disease: Secondary | ICD-10-CM | POA: Diagnosis not present

## 2020-08-13 DIAGNOSIS — Z992 Dependence on renal dialysis: Secondary | ICD-10-CM | POA: Diagnosis not present

## 2020-08-13 DIAGNOSIS — D689 Coagulation defect, unspecified: Secondary | ICD-10-CM | POA: Diagnosis not present

## 2020-08-13 DIAGNOSIS — D509 Iron deficiency anemia, unspecified: Secondary | ICD-10-CM | POA: Diagnosis not present

## 2020-08-13 NOTE — Telephone Encounter (Signed)
Confirmed and screened for OV on 9/13

## 2020-08-16 ENCOUNTER — Other Ambulatory Visit: Payer: Self-pay

## 2020-08-16 ENCOUNTER — Encounter: Payer: Self-pay | Admitting: Nurse Practitioner

## 2020-08-16 ENCOUNTER — Ambulatory Visit (INDEPENDENT_AMBULATORY_CARE_PROVIDER_SITE_OTHER): Payer: Medicare Other | Admitting: Nurse Practitioner

## 2020-08-16 VITALS — BP 151/77 | HR 69 | Temp 97.5°F | Resp 16 | Ht 63.0 in | Wt 126.2 lb

## 2020-08-16 DIAGNOSIS — N2581 Secondary hyperparathyroidism of renal origin: Secondary | ICD-10-CM | POA: Diagnosis not present

## 2020-08-16 DIAGNOSIS — N186 End stage renal disease: Secondary | ICD-10-CM | POA: Diagnosis not present

## 2020-08-16 DIAGNOSIS — I6522 Occlusion and stenosis of left carotid artery: Secondary | ICD-10-CM

## 2020-08-16 DIAGNOSIS — D689 Coagulation defect, unspecified: Secondary | ICD-10-CM | POA: Diagnosis not present

## 2020-08-16 DIAGNOSIS — E042 Nontoxic multinodular goiter: Secondary | ICD-10-CM | POA: Diagnosis not present

## 2020-08-16 DIAGNOSIS — I700 Atherosclerosis of aorta: Secondary | ICD-10-CM | POA: Diagnosis not present

## 2020-08-16 DIAGNOSIS — Z992 Dependence on renal dialysis: Secondary | ICD-10-CM | POA: Diagnosis not present

## 2020-08-16 DIAGNOSIS — D631 Anemia in chronic kidney disease: Secondary | ICD-10-CM | POA: Diagnosis not present

## 2020-08-16 DIAGNOSIS — D509 Iron deficiency anemia, unspecified: Secondary | ICD-10-CM | POA: Diagnosis not present

## 2020-08-16 MED ORDER — ROSUVASTATIN CALCIUM 5 MG PO TABS: 5.0000 mg | ORAL_TABLET | Freq: Every day | ORAL | 3 refills | Status: DC

## 2020-08-16 NOTE — Progress Notes (Signed)
[facility] [place], [place] [address]  Internal MEDICINE  Office Visit Note  Patient Name: Kristen Francis  [id number]  [id number]  Date of Service: 09/06/2020  Chief Complaint  Patient presents with  . Follow-up    review [place]  . Hyperlipidemia  . Hypertension    The patient is here for follow up visit. She is concerned about increased fatigue and weakness. Has been going on for past three weeks or so. She is hemodialysis patient. [place] on Monday, Wednesday, and Friday. States that those days, the fatigue and weakness are worse. She has no appetite on dialysis days and not being able to eat causes her to feel more fatigued and weak. She denies chest pain, pressure, shortness of breath, or headaches. She denies nausea or vomiting. The patient has had carotid doppler study done. She has <50% stenosis on the right side and 50-69% stenosis on the left. There is mild plaque, bilaterally. Her labs indicate a very high ferritin level and mildly decreased phosphorus. Her hematologist and nephrologist are both aware of the abnormal lab values and are working on normalizing these levels. She has no new concerns or complaints today.       Current Medication: Outpatient Encounter Medications as of 08/16/2020  Medication Sig  . allopurinol (ZYLOPRIM) 100 MG tablet Take 1 tablet (100 mg total) by mouth daily.  Marland Kitchen amLODipine (NORVASC) 10 MG tablet Take by mouth.  . B Complex-C-Folic Acid (RENAL-VITE) 0.8 MG TABS Take by mouth.  . carvedilol (COREG) 25 MG tablet Take 25 mg by mouth 2 (two) times daily with a meal.  . cholecalciferol (VITAMIN D) 1000 units tablet Take 1,000 Units by mouth daily.   . cinacalcet (SENSIPAR) 60 MG tablet Take by mouth.  . cinacalcet (SENSIPAR) 60 MG tablet Take 60 mg by mouth daily.  . cinacalcet (SENSIPAR) 90 MG tablet Take 90 mg by mouth daily.  . ferrous sulfate 325 (65 FE) MG tablet Take 325 mg by mouth daily.   Marland Kitchen lidocaine-prilocaine  (EMLA) cream APPLY SMALL AMOUNT TO ACCESS SITE (AVF) 3 TIMES A WEEK 1 HOUR BEFORE DIALYSIS. COVER WITH OCCLUSIVE DRESSING (SARAN WRAP)  . losartan (COZAAR) 50 MG tablet Take 50 mg by mouth daily.  . Methoxy PEG-Epoetin Beta (MIRCERA IJ) Mircera  . Methoxy PEG-Epoetin Beta (MIRCERA IJ) Mircera  . mirtazapine (REMERON) 7.5 MG tablet Take 1 tablet (7.5 mg total) by mouth at bedtime. (Patient not taking: Reported on 08/19/2020)  . sevelamer carbonate (RENVELA) 800 MG tablet Take by mouth.  . torsemide (DEMADEX) 5 MG tablet Take 5 mg by mouth daily.   . [DISCONTINUED] sevelamer (RENAGEL) 800 MG tablet Take by mouth.  . [DISCONTINUED] sevelamer carbonate (RENVELA) 800 MG tablet Take 2,400 mg by mouth 3 (three) times daily.  . rosuvastatin (CRESTOR) 5 MG tablet Take 1 tablet (5 mg total) by mouth daily.   No facility-administered encounter medications on file as of 08/16/2020.    Surgical History: Past Surgical History:  Procedure Laterality Date  . A/V FISTULAGRAM Left 12/25/2018   Procedure: A/V FISTULAGRAM;  Surgeon: Katha Cabal, MD;  Location: [facility];  Service: Cardiovascular;  Laterality: Left;  . ABDOMINAL HYSTERECTOMY    . APPENDECTOMY    . AV FISTULA PLACEMENT Left 07/12/2018   Procedure: INSERTION OF ARTERIOVENOUS (AV) GORE-TEX GRAFT ARM ( BRACHIAL AXILLARY );  Surgeon: Katha Cabal, MD;  Location: [facility] ORS;  Service: Vascular;  Laterality: Left;  . COLONOSCOPY WITH PROPOFOL N/A  01/14/2016   Procedure: COLONOSCOPY WITH PROPOFOL;  Surgeon: Manya Silvas, MD;  Location: [facility] ENDOSCOPY;  Service: Endoscopy;  Laterality: N/A;  . TONSILLECTOMY      Medical History: Past Medical History:  Diagnosis Date  . Anemia   . Arthritis   . Chronic kidney disease    ESRD  . Complication of anesthesia    had procedure and felt incision early 80s  . Gout   . Hyperlipemia   . Hypertension   . Macular degeneration, right eye   . Stroke [facility])     Family  History: Family History  Problem Relation Age of Onset  . Kidney disease Mother   . Breast cancer Neg Hx     Social History   Socioeconomic History  . Marital status: Married    Spouse name: Not on file  . Number of children: Not on file  . Years of education: Not on file  . Highest education level: Not on file  Occupational History  . Not on file  Tobacco Use  . Smoking status: Former Research scientist (life sciences)  . Smokeless tobacco: Never Used  Vaping Use  . Vaping Use: Never used  Substance and Sexual Activity  . Alcohol use: No  . Drug use: No  . Sexual activity: Not on file  Other Topics Concern  . Not on file  Social History Narrative   Lives in  [place]; used to clean; no smoking; no alcohol.    Social Determinants of Health   Financial Resource Strain:   . Difficulty of Paying Living Expenses: Not on file  Food Insecurity:   . Worried About Charity fundraiser in the Last Year: Not on file  . Ran Out of Food in the Last Year: Not on file  Transportation Needs:   . Lack of Transportation (Medical): Not on file  . Lack of Transportation (Non-Medical): Not on file  Physical Activity:   . Days of Exercise per Week: Not on file  . Minutes of Exercise per Session: Not on file  Stress:   . Feeling of Stress : Not on file  Social Connections:   . Frequency of Communication with Friends and Family: Not on file  . Frequency of Social Gatherings with Friends and Family: Not on file  . Attends Religious Services: Not on file  . Active Member of Clubs or Organizations: Not on file  . Attends Archivist Meetings: Not on file  . Marital Status: Not on file  Intimate Partner Violence:   . Fear of Current or Ex-Partner: Not on file  . Emotionally Abused: Not on file  . Physically Abused: Not on file  . Sexually Abused: Not on file      Review of Systems  Constitutional: Positive for appetite change, fatigue and unexpected weight change. Negative for chills.  HENT:  Negative for congestion, postnasal drip, rhinorrhea, sneezing and sore throat.   Respiratory: Negative for cough, chest tightness, shortness of breath and wheezing.   Cardiovascular: Negative for chest pain and palpitations.  Gastrointestinal: Positive for nausea. Negative for abdominal pain, constipation, diarrhea and vomiting.  Endocrine: Negative for cold intolerance, heat intolerance, polydipsia and polyuria.  Musculoskeletal: Negative for arthralgias, back pain, joint swelling and neck pain.  Skin: Negative for rash.  Allergic/Immunologic: Negative for environmental allergies.  Neurological: Positive for weakness. Negative for dizziness, tremors, numbness and headaches.  Hematological: Negative for adenopathy. Does not bruise/bleed easily.  Psychiatric/Behavioral: Negative for behavioral problems (Depression), dysphoric mood, sleep disturbance and suicidal  ideas. The patient is not nervous/anxious.     Today's Vitals   08/16/20 1610  BP: (!) 151/77  Pulse: 69  Resp: 16  Temp: (!) 97.5 F (36.4 C)  SpO2: 98%  Weight: 126 lb 3.2 oz (57.2 kg)  Height: 5\' 3"  (1.6 m)   Body mass index is 22.36 kg/m.  Physical Exam Vitals and nursing note reviewed.  Constitutional:      General: She is not in acute distress.    Appearance: Normal appearance.  HENT:     Head: Normocephalic and atraumatic.     Right Ear: Tympanic membrane normal.     Nose: Nose normal.  Eyes:     Extraocular Movements: Extraocular movements intact.     Pupils: Pupils are equal, round, and reactive to light.  Neck:     Thyroid: No thyroid mass, thyromegaly or thyroid tenderness.     Vascular: No carotid bruit.     Trachea: Trachea normal.  Cardiovascular:     Rate and Rhythm: Normal rate and regular rhythm.     Pulses: Normal pulses.     Heart sounds: Normal heart sounds.     Comments: Regular rhythm with intermittent ectopic beats per auscultation Pulmonary:     Effort: Pulmonary effort is normal.      Breath sounds: Normal breath sounds.  Chest:     Chest wall: No mass, lacerations or deformity.     Breasts: Tanner Score is 5.        Right: Normal.        Left: Normal.  Abdominal:     General: Bowel sounds are normal.     Palpations: Abdomen is soft.     Tenderness: There is no abdominal tenderness. There is no right CVA tenderness or left CVA tenderness.  Musculoskeletal:     Cervical back: Normal range of motion and neck supple.     Right lower leg: No edema.     Left lower leg: No edema.  Lymphadenopathy:     Upper Body:     Right upper body: No axillary or pectoral adenopathy.     Left upper body: No axillary or pectoral adenopathy.  Skin:    General: Skin is warm and dry.     Capillary Refill: Capillary refill takes 2 to 3 seconds.  Neurological:     Mental Status: She is alert and oriented to person, place, and time. Mental status is at baseline.     Deep Tendon Reflexes:     Reflex Scores:      Patellar reflexes are 1+ on the right side and 1+ on the left side. Psychiatric:        Attention and Perception: Attention and perception normal.        Mood and Affect: Affect normal. Mood is depressed.        Speech: Speech normal.        Behavior: Behavior normal. Behavior is cooperative.        Thought Content: Thought content normal.        Cognition and Memory: Cognition and memory normal.        Judgment: Judgment normal.     Assessment/Plan: 1. Atherosclerosis of aorta ([place]) Reviewed carotid ultrasound which showed mild plaque with <50% stenosis on the right and 50-69% stenosis on the left. Will continue to monitor closely. She also sees vascular surgery routinely.   2. Left carotid stenosis Reviewed carotid ultrasound which showed mild plaque with <50% stenosis on the right and  50-69% stenosis on the left. Start rosuvastatin 5mg  daily.  - rosuvastatin (CRESTOR) 5 MG tablet; Take 1 tablet (5 mg total) by mouth daily.  Dispense: 30 tablet; Refill: 3  3.  Multinodular goiter Thyroid nodule seen on carotid doppler. Will get ultrasound of the thyroid for further evaluation.  - US THYROID; Future  4. End stage renal disease ([place]) Continue hemodialysis as scheduled.   General Counseling: Carmelle verbalizes understanding of the findings of todays visit and agrees with plan of treatment. I have discussed any further diagnostic evaluation that may be needed or ordered today. We also reviewed her medications today. she has been encouraged to call the office with any questions or concerns that should arise related to todays visit.  Hypertension Counseling:   The following hypertensive lifestyle modification were recommended and discussed:  1. Limiting alcohol intake to less than 1 oz/day of ethanol:(24 oz of beer or 8 oz of wine or 2 oz of 100-proof whiskey). 2. Take baby ASA 81 mg daily. 3. Importance of regular aerobic exercise and losing weight. 4. Reduce dietary saturated fat and cholesterol intake for overall cardiovascular health. 5. Maintaining adequate dietary potassium, calcium, and magnesium intake. 6. Regular monitoring of the blood pressure. 7. Reduce sodium intake to less than 100 mmol/day (less than 2.3 gm of sodium or less than 6 gm of sodium choride)   This patient was seen by [place] with Dr Lavera Guise as a part of collaborative care agreement  Orders Placed This Encounter  Procedures  . US THYROID    Meds ordered this encounter  Medications  . rosuvastatin (CRESTOR) 5 MG tablet    Sig: Take 1 tablet (5 mg total) by mouth daily.    Dispense:  30 tablet    Refill:  3    Patient will take on days she does not have dialysis    Order Specific Question:   Supervising Provider    Answer:   Lavera Guise [[id number]]    Total time spent: 30 Minutes Time spent includes review of chart, medications, test results, and follow up plan with the patient.      Dr Lavera Guise Internal medicine

## 2020-08-18 DIAGNOSIS — D689 Coagulation defect, unspecified: Secondary | ICD-10-CM | POA: Diagnosis not present

## 2020-08-18 DIAGNOSIS — N186 End stage renal disease: Secondary | ICD-10-CM | POA: Diagnosis not present

## 2020-08-18 DIAGNOSIS — N2581 Secondary hyperparathyroidism of renal origin: Secondary | ICD-10-CM | POA: Diagnosis not present

## 2020-08-18 DIAGNOSIS — D631 Anemia in chronic kidney disease: Secondary | ICD-10-CM | POA: Diagnosis not present

## 2020-08-18 DIAGNOSIS — I700 Atherosclerosis of aorta: Secondary | ICD-10-CM | POA: Insufficient documentation

## 2020-08-18 DIAGNOSIS — Z992 Dependence on renal dialysis: Secondary | ICD-10-CM | POA: Diagnosis not present

## 2020-08-18 DIAGNOSIS — D509 Iron deficiency anemia, unspecified: Secondary | ICD-10-CM | POA: Diagnosis not present

## 2020-08-19 ENCOUNTER — Ambulatory Visit (INDEPENDENT_AMBULATORY_CARE_PROVIDER_SITE_OTHER): Payer: Medicare Other | Admitting: Nurse Practitioner

## 2020-08-19 ENCOUNTER — Ambulatory Visit (INDEPENDENT_AMBULATORY_CARE_PROVIDER_SITE_OTHER): Payer: Medicare Other

## 2020-08-19 ENCOUNTER — Encounter (INDEPENDENT_AMBULATORY_CARE_PROVIDER_SITE_OTHER): Payer: Self-pay | Admitting: Nurse Practitioner

## 2020-08-19 ENCOUNTER — Other Ambulatory Visit: Payer: Self-pay

## 2020-08-19 VITALS — BP 166/73 | HR 70 | Resp 17 | Ht 63.0 in | Wt 125.0 lb

## 2020-08-19 DIAGNOSIS — E1129 Type 2 diabetes mellitus with other diabetic kidney complication: Secondary | ICD-10-CM

## 2020-08-19 DIAGNOSIS — N186 End stage renal disease: Secondary | ICD-10-CM | POA: Diagnosis not present

## 2020-08-19 DIAGNOSIS — R1084 Generalized abdominal pain: Secondary | ICD-10-CM

## 2020-08-19 DIAGNOSIS — I10 Essential (primary) hypertension: Secondary | ICD-10-CM | POA: Diagnosis not present

## 2020-08-20 ENCOUNTER — Ambulatory Visit
Admission: RE | Admit: 2020-08-20 | Discharge: 2020-08-20 | Disposition: A | Discharge status: Home / Self Care | Payer: Medicare Other | Source: Ambulatory Visit | Attending: Nurse Practitioner | Admitting: Nurse Practitioner

## 2020-08-20 DIAGNOSIS — D689 Coagulation defect, unspecified: Secondary | ICD-10-CM | POA: Diagnosis not present

## 2020-08-20 DIAGNOSIS — R928 Other abnormal and inconclusive findings on diagnostic imaging of breast: Secondary | ICD-10-CM | POA: Insufficient documentation

## 2020-08-20 DIAGNOSIS — D631 Anemia in chronic kidney disease: Secondary | ICD-10-CM | POA: Diagnosis not present

## 2020-08-20 DIAGNOSIS — D509 Iron deficiency anemia, unspecified: Secondary | ICD-10-CM | POA: Diagnosis not present

## 2020-08-20 DIAGNOSIS — R922 Inconclusive mammogram: Secondary | ICD-10-CM | POA: Diagnosis not present

## 2020-08-20 DIAGNOSIS — N6012 Diffuse cystic mastopathy of left breast: Secondary | ICD-10-CM | POA: Diagnosis not present

## 2020-08-20 DIAGNOSIS — N6320 Unspecified lump in the left breast, unspecified quadrant: Secondary | ICD-10-CM | POA: Insufficient documentation

## 2020-08-20 DIAGNOSIS — Z992 Dependence on renal dialysis: Secondary | ICD-10-CM | POA: Diagnosis not present

## 2020-08-20 DIAGNOSIS — N186 End stage renal disease: Secondary | ICD-10-CM | POA: Diagnosis not present

## 2020-08-20 DIAGNOSIS — N2581 Secondary hyperparathyroidism of renal origin: Secondary | ICD-10-CM | POA: Diagnosis not present

## 2020-08-22 NOTE — Procedures (Signed)
Francis, Kristen [id number]  DATE OF SERVICE: July 30, 2020  CAROTID DOPPLER INTERPRETATION:  Bilateral Carotid Ultrsasound and Color Doppler Examination was performed. The RIGHT CCA shows mild plaque in the vessel. The LEFT CCA shows mild plaque in the vessel. There was no significant intimal thickening noted in the RIGHT carotid artery. There was no significant intimal thickening in the LEFT carotid artery.  The RIGHT CCA shows peak systolic velocity of 57 cm per second. The end diastolic velocity is 11 cm per second on the RIGHT side. The RIGHT ICA shows peak systolic velocity of 80 per second. RIGHT sided ICA end diastolic velocity is 25 cm per second. The RIGHT ECA shows a peak systolic velocity of 86VE per second. The ICA/CCA ratio is calculated to be 1.4. This suggests less than 50% stenosis. The Vertebral Artery shows antegrade flow.  The LEFT CCA shows peak systolic velocity of 58 cm per second. The end diastolic velocity is 17 cm per second on the LEFT side. The LEFT ICA shows peak systolic velocity of 720 per second. LEFT sided ICA end diastolic velocity is 35 cm per second. The LEFT ECA shows a peak systolic velocity of 46 cm per second. The ICA/CCA ratio is calculated to be 1.5. This suggests 50 to 69% stenosis. The Vertebral Artery shows antegrade flow.   Impression:    The RIGHT CAROTID shows less than 50% stenosis. The LEFT CAROTID shows 50 to 69% stenosis.  There is mild plaque formation noted on the LEFT and mild plaque on the RIGHT  side. Consider a repeat Carotid doppler if clinical situation and symptoms warrant in 6-12 months. Patient should be encouraged to change lifestyles such as smoking cessation, regular exercise and dietary modification. Use of statins in the right clinical setting and ASA is encouraged.  Allyne Gee, MD [facility] Pulmonary Critical Care Medicine

## 2020-08-23 DIAGNOSIS — D509 Iron deficiency anemia, unspecified: Secondary | ICD-10-CM | POA: Diagnosis not present

## 2020-08-23 DIAGNOSIS — D689 Coagulation defect, unspecified: Secondary | ICD-10-CM | POA: Diagnosis not present

## 2020-08-23 DIAGNOSIS — Z992 Dependence on renal dialysis: Secondary | ICD-10-CM | POA: Diagnosis not present

## 2020-08-23 DIAGNOSIS — D631 Anemia in chronic kidney disease: Secondary | ICD-10-CM | POA: Diagnosis not present

## 2020-08-23 DIAGNOSIS — N2581 Secondary hyperparathyroidism of renal origin: Secondary | ICD-10-CM | POA: Diagnosis not present

## 2020-08-23 DIAGNOSIS — N186 End stage renal disease: Secondary | ICD-10-CM | POA: Diagnosis not present

## 2020-08-24 ENCOUNTER — Encounter (INDEPENDENT_AMBULATORY_CARE_PROVIDER_SITE_OTHER): Payer: Self-pay | Admitting: Nurse Practitioner

## 2020-08-24 NOTE — Progress Notes (Signed)
Subjective:    Patient ID: Kristen Francis, female    DOB: [birth year], 74 y.o.   MRN: [id number] Chief Complaint  Patient presents with  . Follow-up    ultrasound    I am asked to evaluate the patient for the complaint of abdominal pain with uncertain etiology.  The patient is followed by GI who is concerned about mesenteric ischemia.  The patient has noted some weight loss as well as nausea.  The patient does not substantiate food fear, particular foods do not seem to aggravate or alleviate the symptoms.  The patient denies bloody bowel movements or diarrhea.  The patient has a history of colonoscopy which was not diagnostic.  No history of peptic ulcer disease.   No prior peripheral angiograms or vascular interventions.  The patient denies amaurosis fugax or recent TIA symptoms. There are no recent neurological changes noted. The patient denies claudication symptoms or rest pain symptoms. The patient denies history of DVT, PE or superficial thrombophlebitis. The patient denies recent episodes of angina   The patient is a current end-stage renal disease patient.  Noninvasive studies today show no evidence of mesenteric stenosis.  There is a normal celiac artery, superior mesenteric artery, inferior mesenteric artery as well as splenic artery hepatic artery findings.   Review of Systems  Constitutional: Positive for unexpected weight change.  Gastrointestinal: Positive for abdominal pain.  All other systems reviewed and are negative.      Objective:   Physical Exam Vitals reviewed.  HENT:     Head: Normocephalic.  Cardiovascular:     Rate and Rhythm: Normal rate and regular rhythm.     Pulses: Normal pulses.     Heart sounds: Normal heart sounds.  Pulmonary:     Effort: Pulmonary effort is normal.     Breath sounds: Normal breath sounds.  Abdominal:     General: Abdomen is flat. Bowel sounds are normal.  Skin:    General: Skin is warm and dry.  Neurological:      Mental Status: She is alert and oriented to person, place, and time.  Psychiatric:        Mood and Affect: Mood normal.        Behavior: Behavior normal.        Thought Content: Thought content normal.        Judgment: Judgment normal.     BP (!) 166/73 (BP Location: Right Arm)   Pulse 70   Resp 17   Ht 5\' 3"  (1.6 m)   Wt 125 lb (56.7 kg)   BMI 22.14 kg/m   Past Medical History:  Diagnosis Date  . Anemia   . Arthritis   . Chronic kidney disease    ESRD  . Complication of anesthesia    had procedure and felt incision early 80s  . Gout   . Hyperlipemia   . Hypertension   . Macular degeneration, right eye   . Stroke [facility])     Social History   Socioeconomic History  . Marital status: Married    Spouse name: Not on file  . Number of children: Not on file  . Years of education: Not on file  . Highest education level: Not on file  Occupational History  . Not on file  Tobacco Use  . Smoking status: Former Research scientist (life sciences)  . Smokeless tobacco: Never Used  Vaping Use  . Vaping Use: Never used  Substance and Sexual Activity  . Alcohol use: No  . Drug use:  No  . Sexual activity: Not on file  Other Topics Concern  . Not on file  Social History Narrative   Lives in  [place]; used to clean; no smoking; no alcohol.    Social Determinants of Health   Financial Resource Strain:   . Difficulty of Paying Living Expenses: Not on file  Food Insecurity:   . Worried About Charity fundraiser in the Last Year: Not on file  . Ran Out of Food in the Last Year: Not on file  Transportation Needs:   . Lack of Transportation (Medical): Not on file  . Lack of Transportation (Non-Medical): Not on file  Physical Activity:   . Days of Exercise per Week: Not on file  . Minutes of Exercise per Session: Not on file  Stress:   . Feeling of Stress : Not on file  Social Connections:   . Frequency of Communication with Friends and Family: Not on file  . Frequency of Social Gatherings with  Friends and Family: Not on file  . Attends Religious Services: Not on file  . Active Member of Clubs or Organizations: Not on file  . Attends Archivist Meetings: Not on file  . Marital Status: Not on file  Intimate Partner Violence:   . Fear of Current or Ex-Partner: Not on file  . Emotionally Abused: Not on file  . Physically Abused: Not on file  . Sexually Abused: Not on file    Past Surgical History:  Procedure Laterality Date  . A/V FISTULAGRAM Left 12/25/2018   Procedure: A/V FISTULAGRAM;  Surgeon: Katha Cabal, MD;  Location: [place] [facility];  Service: Cardiovascular;  Laterality: Left;  . ABDOMINAL HYSTERECTOMY    . APPENDECTOMY    . AV FISTULA PLACEMENT Left 07/12/2018   Procedure: INSERTION OF ARTERIOVENOUS (AV) GORE-TEX GRAFT ARM ( BRACHIAL AXILLARY );  Surgeon: Katha Cabal, MD;  Location: [facility] ORS;  Service: Vascular;  Laterality: Left;  . COLONOSCOPY WITH PROPOFOL N/A 01/14/2016   Procedure: COLONOSCOPY WITH PROPOFOL;  Surgeon: Manya Silvas, MD;  Location: [facility] ENDOSCOPY;  Service: Endoscopy;  Laterality: N/A;  . TONSILLECTOMY      Family History  Problem Relation Age of Onset  . Kidney disease Mother   . Breast cancer Neg Hx     Allergies  Allergen Reactions  . Other Other (See Comments)    Pt does not receive Blood   . Penicillins Hives    Has patient had a PCN reaction causing immediate rash, facial/tongue/throat swelling, SOB or lightheadedness with hypotension: YES Has patient had a PCN reaction causing severe rash involving mucus membranes or skin necrosis: no Has patient had a PCN reaction that required hospitalization: no Has patient had a PCN reaction occurring within the last 10 years: no If all of the above answers are "NO", then may proceed with Cephalosporin use.   . Sodium Bicarbonate Itching       Assessment & Plan:   1. End stage renal disease ([place]) The patient denies any recent bleeding episodes during  dialysis.  She notes that everything has been functioning well.  She denies any hand pain or swelling.  We will maintain normal follow-up in 57-month intervals.  2. Essential hypertension Continue antihypertensive medications as already ordered, these medications have been reviewed and there are no changes at this time.  3. Generalized abdominal pain No evidence of mesenteric artery stenosis.  Further work-up will be done by PCP if the patient continues to have  abdominal pain and weight loss.  4. Type 2 diabetes mellitus with other diabetic kidney complication (HCC) Continue hypoglycemic medications as already ordered, these medications have been reviewed and there are no changes at this time.  Hgb A1C to be monitored as already arranged by primary service    Current Outpatient Medications on File Prior to Visit  Medication Sig Dispense Refill  . allopurinol (ZYLOPRIM) 100 MG tablet Take 1 tablet (100 mg total) by mouth daily. 30 tablet 1  . amLODipine (NORVASC) 10 MG tablet Take by mouth.    . B Complex-C-Folic Acid (RENAL-VITE) 0.8 MG TABS Take by mouth.    . carvedilol (COREG) 25 MG tablet Take 25 mg by mouth 2 (two) times daily with a meal.    . cholecalciferol (VITAMIN D) 1000 units tablet Take 1,000 Units by mouth daily.     . cinacalcet (SENSIPAR) 60 MG tablet Take by mouth.    . cinacalcet (SENSIPAR) 60 MG tablet Take 60 mg by mouth daily.    . cinacalcet (SENSIPAR) 90 MG tablet Take 90 mg by mouth daily.    . ferrous sulfate 325 (65 FE) MG tablet Take 325 mg by mouth daily.     Marland Kitchen lidocaine-prilocaine (EMLA) cream APPLY SMALL AMOUNT TO ACCESS SITE (AVF) 3 TIMES A WEEK 1 HOUR BEFORE DIALYSIS. COVER WITH OCCLUSIVE DRESSING (SARAN WRAP)  6  . losartan (COZAAR) 50 MG tablet Take 50 mg by mouth daily.    . Methoxy PEG-Epoetin Beta (MIRCERA IJ) Mircera    . Methoxy PEG-Epoetin Beta (MIRCERA IJ) Mircera    . rosuvastatin (CRESTOR) 5 MG tablet Take 1 tablet (5 mg total) by mouth daily.  30 tablet 3  . sevelamer carbonate (RENVELA) 800 MG tablet Take by mouth.    . torsemide (DEMADEX) 5 MG tablet Take 5 mg by mouth daily.   11  . mirtazapine (REMERON) 7.5 MG tablet Take 1 tablet (7.5 mg total) by mouth at bedtime. (Patient not taking: Reported on 08/19/2020) 30 tablet 3   No current facility-administered medications on file prior to visit.    There are no Patient Instructions on file for this visit. No follow-ups on file.   Kris Hartmann, NP

## 2020-08-26 DIAGNOSIS — H353131 Nonexudative age-related macular degeneration, bilateral, early dry stage: Secondary | ICD-10-CM | POA: Diagnosis not present

## 2020-08-26 DIAGNOSIS — H31011 Macula scars of posterior pole (postinflammatory) (post-traumatic), right eye: Secondary | ICD-10-CM | POA: Diagnosis not present

## 2020-08-27 DIAGNOSIS — D509 Iron deficiency anemia, unspecified: Secondary | ICD-10-CM | POA: Diagnosis not present

## 2020-08-27 DIAGNOSIS — N2581 Secondary hyperparathyroidism of renal origin: Secondary | ICD-10-CM | POA: Diagnosis not present

## 2020-08-27 DIAGNOSIS — N186 End stage renal disease: Secondary | ICD-10-CM | POA: Diagnosis not present

## 2020-08-27 DIAGNOSIS — Z992 Dependence on renal dialysis: Secondary | ICD-10-CM | POA: Diagnosis not present

## 2020-08-27 DIAGNOSIS — D689 Coagulation defect, unspecified: Secondary | ICD-10-CM | POA: Diagnosis not present

## 2020-08-27 DIAGNOSIS — D631 Anemia in chronic kidney disease: Secondary | ICD-10-CM | POA: Diagnosis not present

## 2020-08-30 DIAGNOSIS — N2581 Secondary hyperparathyroidism of renal origin: Secondary | ICD-10-CM | POA: Diagnosis not present

## 2020-08-30 DIAGNOSIS — D631 Anemia in chronic kidney disease: Secondary | ICD-10-CM | POA: Diagnosis not present

## 2020-08-30 DIAGNOSIS — Z992 Dependence on renal dialysis: Secondary | ICD-10-CM | POA: Diagnosis not present

## 2020-08-30 DIAGNOSIS — D689 Coagulation defect, unspecified: Secondary | ICD-10-CM | POA: Diagnosis not present

## 2020-08-30 DIAGNOSIS — D509 Iron deficiency anemia, unspecified: Secondary | ICD-10-CM | POA: Diagnosis not present

## 2020-08-30 DIAGNOSIS — N186 End stage renal disease: Secondary | ICD-10-CM | POA: Diagnosis not present

## 2020-08-30 NOTE — Progress Notes (Signed)
Review at visit 09/24/2020

## 2020-09-01 DIAGNOSIS — D631 Anemia in chronic kidney disease: Secondary | ICD-10-CM | POA: Diagnosis not present

## 2020-09-01 DIAGNOSIS — Z992 Dependence on renal dialysis: Secondary | ICD-10-CM | POA: Diagnosis not present

## 2020-09-01 DIAGNOSIS — N186 End stage renal disease: Secondary | ICD-10-CM | POA: Diagnosis not present

## 2020-09-01 DIAGNOSIS — D509 Iron deficiency anemia, unspecified: Secondary | ICD-10-CM | POA: Diagnosis not present

## 2020-09-01 DIAGNOSIS — N2581 Secondary hyperparathyroidism of renal origin: Secondary | ICD-10-CM | POA: Diagnosis not present

## 2020-09-01 DIAGNOSIS — D689 Coagulation defect, unspecified: Secondary | ICD-10-CM | POA: Diagnosis not present

## 2020-09-02 DIAGNOSIS — N040 Nephrotic syndrome with minor glomerular abnormality: Secondary | ICD-10-CM | POA: Diagnosis not present

## 2020-09-02 DIAGNOSIS — Z992 Dependence on renal dialysis: Secondary | ICD-10-CM | POA: Diagnosis not present

## 2020-09-02 DIAGNOSIS — N186 End stage renal disease: Secondary | ICD-10-CM | POA: Diagnosis not present

## 2020-09-03 DIAGNOSIS — Z992 Dependence on renal dialysis: Secondary | ICD-10-CM | POA: Diagnosis not present

## 2020-09-03 DIAGNOSIS — D689 Coagulation defect, unspecified: Secondary | ICD-10-CM | POA: Diagnosis not present

## 2020-09-03 DIAGNOSIS — D509 Iron deficiency anemia, unspecified: Secondary | ICD-10-CM | POA: Diagnosis not present

## 2020-09-03 DIAGNOSIS — E1129 Type 2 diabetes mellitus with other diabetic kidney complication: Secondary | ICD-10-CM | POA: Diagnosis not present

## 2020-09-03 DIAGNOSIS — N186 End stage renal disease: Secondary | ICD-10-CM | POA: Diagnosis not present

## 2020-09-03 DIAGNOSIS — R52 Pain, unspecified: Secondary | ICD-10-CM | POA: Diagnosis not present

## 2020-09-03 DIAGNOSIS — N2581 Secondary hyperparathyroidism of renal origin: Secondary | ICD-10-CM | POA: Diagnosis not present

## 2020-09-06 DIAGNOSIS — D689 Coagulation defect, unspecified: Secondary | ICD-10-CM | POA: Diagnosis not present

## 2020-09-06 DIAGNOSIS — Z992 Dependence on renal dialysis: Secondary | ICD-10-CM | POA: Diagnosis not present

## 2020-09-06 DIAGNOSIS — N186 End stage renal disease: Secondary | ICD-10-CM | POA: Diagnosis not present

## 2020-09-06 DIAGNOSIS — I6522 Occlusion and stenosis of left carotid artery: Secondary | ICD-10-CM | POA: Insufficient documentation

## 2020-09-06 DIAGNOSIS — N2581 Secondary hyperparathyroidism of renal origin: Secondary | ICD-10-CM | POA: Diagnosis not present

## 2020-09-06 DIAGNOSIS — D509 Iron deficiency anemia, unspecified: Secondary | ICD-10-CM | POA: Diagnosis not present

## 2020-09-06 DIAGNOSIS — E042 Nontoxic multinodular goiter: Secondary | ICD-10-CM | POA: Insufficient documentation

## 2020-09-06 DIAGNOSIS — E1129 Type 2 diabetes mellitus with other diabetic kidney complication: Secondary | ICD-10-CM | POA: Diagnosis not present

## 2020-09-06 DIAGNOSIS — R52 Pain, unspecified: Secondary | ICD-10-CM | POA: Diagnosis not present

## 2020-09-08 DIAGNOSIS — R52 Pain, unspecified: Secondary | ICD-10-CM | POA: Diagnosis not present

## 2020-09-08 DIAGNOSIS — D689 Coagulation defect, unspecified: Secondary | ICD-10-CM | POA: Diagnosis not present

## 2020-09-08 DIAGNOSIS — N186 End stage renal disease: Secondary | ICD-10-CM | POA: Diagnosis not present

## 2020-09-08 DIAGNOSIS — Z992 Dependence on renal dialysis: Secondary | ICD-10-CM | POA: Diagnosis not present

## 2020-09-08 DIAGNOSIS — D509 Iron deficiency anemia, unspecified: Secondary | ICD-10-CM | POA: Diagnosis not present

## 2020-09-08 DIAGNOSIS — N2581 Secondary hyperparathyroidism of renal origin: Secondary | ICD-10-CM | POA: Diagnosis not present

## 2020-09-08 DIAGNOSIS — E1129 Type 2 diabetes mellitus with other diabetic kidney complication: Secondary | ICD-10-CM | POA: Diagnosis not present

## 2020-09-10 DIAGNOSIS — D509 Iron deficiency anemia, unspecified: Secondary | ICD-10-CM | POA: Diagnosis not present

## 2020-09-10 DIAGNOSIS — R52 Pain, unspecified: Secondary | ICD-10-CM | POA: Diagnosis not present

## 2020-09-10 DIAGNOSIS — E1129 Type 2 diabetes mellitus with other diabetic kidney complication: Secondary | ICD-10-CM | POA: Diagnosis not present

## 2020-09-10 DIAGNOSIS — N186 End stage renal disease: Secondary | ICD-10-CM | POA: Diagnosis not present

## 2020-09-10 DIAGNOSIS — Z992 Dependence on renal dialysis: Secondary | ICD-10-CM | POA: Diagnosis not present

## 2020-09-10 DIAGNOSIS — N2581 Secondary hyperparathyroidism of renal origin: Secondary | ICD-10-CM | POA: Diagnosis not present

## 2020-09-10 DIAGNOSIS — D689 Coagulation defect, unspecified: Secondary | ICD-10-CM | POA: Diagnosis not present

## 2020-09-13 DIAGNOSIS — Z992 Dependence on renal dialysis: Secondary | ICD-10-CM | POA: Diagnosis not present

## 2020-09-13 DIAGNOSIS — E1129 Type 2 diabetes mellitus with other diabetic kidney complication: Secondary | ICD-10-CM | POA: Diagnosis not present

## 2020-09-13 DIAGNOSIS — D509 Iron deficiency anemia, unspecified: Secondary | ICD-10-CM | POA: Diagnosis not present

## 2020-09-13 DIAGNOSIS — N186 End stage renal disease: Secondary | ICD-10-CM | POA: Diagnosis not present

## 2020-09-13 DIAGNOSIS — R52 Pain, unspecified: Secondary | ICD-10-CM | POA: Diagnosis not present

## 2020-09-13 DIAGNOSIS — D689 Coagulation defect, unspecified: Secondary | ICD-10-CM | POA: Diagnosis not present

## 2020-09-13 DIAGNOSIS — N2581 Secondary hyperparathyroidism of renal origin: Secondary | ICD-10-CM | POA: Diagnosis not present

## 2020-09-15 DIAGNOSIS — D509 Iron deficiency anemia, unspecified: Secondary | ICD-10-CM | POA: Diagnosis not present

## 2020-09-15 DIAGNOSIS — D689 Coagulation defect, unspecified: Secondary | ICD-10-CM | POA: Diagnosis not present

## 2020-09-15 DIAGNOSIS — N2581 Secondary hyperparathyroidism of renal origin: Secondary | ICD-10-CM | POA: Diagnosis not present

## 2020-09-15 DIAGNOSIS — R52 Pain, unspecified: Secondary | ICD-10-CM | POA: Diagnosis not present

## 2020-09-15 DIAGNOSIS — N186 End stage renal disease: Secondary | ICD-10-CM | POA: Diagnosis not present

## 2020-09-15 DIAGNOSIS — E1129 Type 2 diabetes mellitus with other diabetic kidney complication: Secondary | ICD-10-CM | POA: Diagnosis not present

## 2020-09-15 DIAGNOSIS — Z992 Dependence on renal dialysis: Secondary | ICD-10-CM | POA: Diagnosis not present

## 2020-09-17 ENCOUNTER — Ambulatory Visit: Payer: Medicare Other

## 2020-09-17 ENCOUNTER — Other Ambulatory Visit: Payer: Self-pay

## 2020-09-17 DIAGNOSIS — N2581 Secondary hyperparathyroidism of renal origin: Secondary | ICD-10-CM | POA: Diagnosis not present

## 2020-09-17 DIAGNOSIS — E1129 Type 2 diabetes mellitus with other diabetic kidney complication: Secondary | ICD-10-CM | POA: Diagnosis not present

## 2020-09-17 DIAGNOSIS — Z992 Dependence on renal dialysis: Secondary | ICD-10-CM | POA: Diagnosis not present

## 2020-09-17 DIAGNOSIS — R52 Pain, unspecified: Secondary | ICD-10-CM | POA: Diagnosis not present

## 2020-09-17 DIAGNOSIS — D689 Coagulation defect, unspecified: Secondary | ICD-10-CM | POA: Diagnosis not present

## 2020-09-17 DIAGNOSIS — D509 Iron deficiency anemia, unspecified: Secondary | ICD-10-CM | POA: Diagnosis not present

## 2020-09-17 DIAGNOSIS — E042 Nontoxic multinodular goiter: Secondary | ICD-10-CM | POA: Diagnosis not present

## 2020-09-17 DIAGNOSIS — N186 End stage renal disease: Secondary | ICD-10-CM | POA: Diagnosis not present

## 2020-09-20 DIAGNOSIS — N186 End stage renal disease: Secondary | ICD-10-CM | POA: Diagnosis not present

## 2020-09-20 DIAGNOSIS — Z992 Dependence on renal dialysis: Secondary | ICD-10-CM | POA: Diagnosis not present

## 2020-09-20 DIAGNOSIS — E1129 Type 2 diabetes mellitus with other diabetic kidney complication: Secondary | ICD-10-CM | POA: Diagnosis not present

## 2020-09-20 DIAGNOSIS — D689 Coagulation defect, unspecified: Secondary | ICD-10-CM | POA: Diagnosis not present

## 2020-09-20 DIAGNOSIS — N2581 Secondary hyperparathyroidism of renal origin: Secondary | ICD-10-CM | POA: Diagnosis not present

## 2020-09-20 DIAGNOSIS — R52 Pain, unspecified: Secondary | ICD-10-CM | POA: Diagnosis not present

## 2020-09-20 DIAGNOSIS — D509 Iron deficiency anemia, unspecified: Secondary | ICD-10-CM | POA: Diagnosis not present

## 2020-09-22 DIAGNOSIS — R52 Pain, unspecified: Secondary | ICD-10-CM | POA: Diagnosis not present

## 2020-09-22 DIAGNOSIS — N2581 Secondary hyperparathyroidism of renal origin: Secondary | ICD-10-CM | POA: Diagnosis not present

## 2020-09-22 DIAGNOSIS — N186 End stage renal disease: Secondary | ICD-10-CM | POA: Diagnosis not present

## 2020-09-22 DIAGNOSIS — E1129 Type 2 diabetes mellitus with other diabetic kidney complication: Secondary | ICD-10-CM | POA: Diagnosis not present

## 2020-09-22 DIAGNOSIS — D689 Coagulation defect, unspecified: Secondary | ICD-10-CM | POA: Diagnosis not present

## 2020-09-22 DIAGNOSIS — Z992 Dependence on renal dialysis: Secondary | ICD-10-CM | POA: Diagnosis not present

## 2020-09-22 DIAGNOSIS — D509 Iron deficiency anemia, unspecified: Secondary | ICD-10-CM | POA: Diagnosis not present

## 2020-09-23 ENCOUNTER — Telehealth: Payer: Self-pay

## 2020-09-23 NOTE — Telephone Encounter (Signed)
Confirmed and screened for 09-24-20 ov. °

## 2020-09-24 ENCOUNTER — Other Ambulatory Visit: Payer: Self-pay

## 2020-09-24 ENCOUNTER — Encounter: Payer: Self-pay | Admitting: Nurse Practitioner

## 2020-09-24 ENCOUNTER — Ambulatory Visit (INDEPENDENT_AMBULATORY_CARE_PROVIDER_SITE_OTHER): Payer: Medicare Other | Admitting: Nurse Practitioner

## 2020-09-24 VITALS — BP 172/85 | HR 72 | Temp 97.5°F | Resp 16 | Ht 63.0 in | Wt 123.4 lb

## 2020-09-24 DIAGNOSIS — Z992 Dependence on renal dialysis: Secondary | ICD-10-CM | POA: Diagnosis not present

## 2020-09-24 DIAGNOSIS — I6522 Occlusion and stenosis of left carotid artery: Secondary | ICD-10-CM

## 2020-09-24 DIAGNOSIS — I700 Atherosclerosis of aorta: Secondary | ICD-10-CM

## 2020-09-24 DIAGNOSIS — E042 Nontoxic multinodular goiter: Secondary | ICD-10-CM | POA: Diagnosis not present

## 2020-09-24 DIAGNOSIS — N186 End stage renal disease: Secondary | ICD-10-CM

## 2020-09-24 DIAGNOSIS — D689 Coagulation defect, unspecified: Secondary | ICD-10-CM | POA: Diagnosis not present

## 2020-09-24 DIAGNOSIS — R52 Pain, unspecified: Secondary | ICD-10-CM | POA: Diagnosis not present

## 2020-09-24 DIAGNOSIS — N2581 Secondary hyperparathyroidism of renal origin: Secondary | ICD-10-CM | POA: Diagnosis not present

## 2020-09-24 DIAGNOSIS — D509 Iron deficiency anemia, unspecified: Secondary | ICD-10-CM | POA: Diagnosis not present

## 2020-09-24 DIAGNOSIS — E1129 Type 2 diabetes mellitus with other diabetic kidney complication: Secondary | ICD-10-CM | POA: Diagnosis not present

## 2020-09-24 DIAGNOSIS — I10 Essential (primary) hypertension: Secondary | ICD-10-CM

## 2020-09-24 DIAGNOSIS — M79672 Pain in left foot: Secondary | ICD-10-CM

## 2020-09-24 NOTE — Progress Notes (Signed)
[facility] [place], [place] [address]  Internal MEDICINE  Office Visit Note  Patient Name: Kristen Francis  [id number]  [id number]  Date of Service: 10/16/2020  Chief Complaint  Patient presents with  . Follow-up  . Hypertension  . Hyperlipidemia  . Quality Metric Gaps    tetnaus,flu  . controlled substance form    reviewed with PT  . Foot Pain    feet swole    The patient is here for follow up visit. Having left foot pain along lateral aspect of the foot. Slightly swollen. Started a week or so ago. Applied ice, soaked in epsom salt and swelling went down and pain improved. Started to flare back up earlier this week. States that she used to be on medication for gout. No longer on this due to Stage 5 chronic kidney disease. Has not had uric acid checked in some time.  Carotid doppler showing mild plaque in both carotid arteries. There is <50% stenosis on right side and 50-60% stenosis on left.  Thyroid ultrasound showing multiple nodules on both thyroid lobes and isthmus. All measure <1.5cm in diameter. Will repeat in 6 months and refer to endocrinology as indicated.  Blood pressure elevated today. Today is dialysis day and she is unable to take any medications as dialysis makes her feel very nauseated. Blood pressure generally elevated on dialysis days. Patient has mild headache.       Current Medication: Outpatient Encounter Medications as of 09/24/2020  Medication Sig  . allopurinol (ZYLOPRIM) 100 MG tablet Take 1 tablet (100 mg total) by mouth daily.  Marland Kitchen amLODipine (NORVASC) 10 MG tablet Take by mouth.  . B Complex-C-Folic Acid (RENAL-VITE) 0.8 MG TABS Take by mouth.  . carvedilol (COREG) 25 MG tablet Take 25 mg by mouth 2 (two) times daily with a meal.  . cholecalciferol (VITAMIN D) 1000 units tablet Take 1,000 Units by mouth daily.   . cinacalcet (SENSIPAR) 60 MG tablet Take by mouth.  . cinacalcet (SENSIPAR) 60 MG tablet Take 60 mg by  mouth daily.  . cinacalcet (SENSIPAR) 90 MG tablet Take 90 mg by mouth daily.  . ferrous sulfate 325 (65 FE) MG tablet Take 325 mg by mouth daily.   Marland Kitchen lidocaine-prilocaine (EMLA) cream APPLY SMALL AMOUNT TO ACCESS SITE (AVF) 3 TIMES A WEEK 1 HOUR BEFORE DIALYSIS. COVER WITH OCCLUSIVE DRESSING (SARAN WRAP)  . losartan (COZAAR) 50 MG tablet Take 50 mg by mouth daily.  . Methoxy PEG-Epoetin Beta (MIRCERA IJ) Mircera  . Methoxy PEG-Epoetin Beta (MIRCERA IJ) Mircera  . mirtazapine (REMERON) 7.5 MG tablet Take 1 tablet (7.5 mg total) by mouth at bedtime.  . rosuvastatin (CRESTOR) 5 MG tablet Take 1 tablet (5 mg total) by mouth daily.  . sevelamer carbonate (RENVELA) 800 MG tablet Take by mouth.  . torsemide (DEMADEX) 5 MG tablet Take 5 mg by mouth daily.    No facility-administered encounter medications on file as of 09/24/2020.    Surgical History: Past Surgical History:  Procedure Laterality Date  . A/V FISTULAGRAM Left 12/25/2018   Procedure: A/V FISTULAGRAM;  Surgeon: Katha Cabal, MD;  Location: [place] CV LAB;  Service: Cardiovascular;  Laterality: Left;  . ABDOMINAL HYSTERECTOMY    . APPENDECTOMY    . AV FISTULA PLACEMENT Left 07/12/2018   Procedure: INSERTION OF ARTERIOVENOUS (AV) GORE-TEX GRAFT ARM ( BRACHIAL AXILLARY );  Surgeon: Katha Cabal, MD;  Location: [facility] ORS;  Service: Vascular;  Laterality: Left;  . COLONOSCOPY  WITH PROPOFOL N/A 01/14/2016   Procedure: COLONOSCOPY WITH PROPOFOL;  Surgeon: Manya Silvas, MD;  Location: [facility] ENDOSCOPY;  Service: Endoscopy;  Laterality: N/A;  . TONSILLECTOMY      Medical History: Past Medical History:  Diagnosis Date  . Anemia   . Arthritis   . Chronic kidney disease    ESRD  . Complication of anesthesia    had procedure and felt incision early 80s  . Gout   . Hyperlipemia   . Hypertension   . Macular degeneration, right eye   . Stroke [facility])     Family History: Family History  Problem Relation Age of Onset   . Kidney disease Mother   . Breast cancer Neg Hx     Social History   Socioeconomic History  . Marital status: Married    Spouse name: Not on file  . Number of children: Not on file  . Years of education: Not on file  . Highest education level: Not on file  Occupational History  . Not on file  Tobacco Use  . Smoking status: Former Research scientist (life sciences)  . Smokeless tobacco: Never Used  Vaping Use  . Vaping Use: Never used  Substance and Sexual Activity  . Alcohol use: No  . Drug use: No  . Sexual activity: Not on file  Other Topics Concern  . Not on file  Social History Narrative   Lives in  [place]; used to clean; no smoking; no alcohol.    Social Determinants of Health   Financial Resource Strain:   . Difficulty of Paying Living Expenses: Not on file  Food Insecurity:   . Worried About Charity fundraiser in the Last Year: Not on file  . Ran Out of Food in the Last Year: Not on file  Transportation Needs:   . Lack of Transportation (Medical): Not on file  . Lack of Transportation (Non-Medical): Not on file  Physical Activity:   . Days of Exercise per Week: Not on file  . Minutes of Exercise per Session: Not on file  Stress:   . Feeling of Stress : Not on file  Social Connections:   . Frequency of Communication with Friends and Family: Not on file  . Frequency of Social Gatherings with Friends and Family: Not on file  . Attends Religious Services: Not on file  . Active Member of Clubs or Organizations: Not on file  . Attends Archivist Meetings: Not on file  . Marital Status: Not on file  Intimate Partner Violence:   . Fear of Current or Ex-Partner: Not on file  . Emotionally Abused: Not on file  . Physically Abused: Not on file  . Sexually Abused: Not on file      Review of Systems  Constitutional: Positive for appetite change, fatigue and unexpected weight change. Negative for chills.       Reports poor appetite on days she has dialysis. Goes to  dialysis treatments on Monday, Wednesday, and Friday.   HENT: Negative for congestion, postnasal drip, rhinorrhea, sneezing and sore throat.   Respiratory: Negative for cough, chest tightness, shortness of breath and wheezing.   Cardiovascular: Negative for chest pain and palpitations.       Blood pressure elevated today. Has not had medication yeat today, as she just came from hemodialysis treatment.   Gastrointestinal: Positive for nausea. Negative for abdominal pain, constipation, diarrhea and vomiting.  Endocrine: Negative for cold intolerance, heat intolerance, polydipsia and polyuria.  Musculoskeletal: Negative for arthralgias, back  pain, joint swelling and neck pain.  Skin: Negative for rash.  Allergic/Immunologic: Negative for environmental allergies.  Neurological: Positive for weakness. Negative for dizziness, tremors, numbness and headaches.  Hematological: Negative for adenopathy. Does not bruise/bleed easily.  Psychiatric/Behavioral: Negative for behavioral problems (Depression), dysphoric mood, sleep disturbance and suicidal ideas. The patient is not nervous/anxious.     Today's Vitals   09/24/20 1444  BP: (!) 172/85  Pulse: 72  Resp: 16  Temp: (!) 97.5 F (36.4 C)  SpO2: 98%  Weight: 123 lb 6.4 oz (56 kg)  Height: 5\' 3"  (1.6 m)   Body mass index is 21.86 kg/m.  Physical Exam Vitals and nursing note reviewed.  Constitutional:      General: She is not in acute distress.    Appearance: Normal appearance.  HENT:     Head: Normocephalic and atraumatic.     Right Ear: Tympanic membrane normal.     Nose: Nose normal.  Eyes:     Extraocular Movements: Extraocular movements intact.     Pupils: Pupils are equal, round, and reactive to light.  Neck:     Thyroid: Thyromegaly present. No thyroid mass or thyroid tenderness.     Vascular: No carotid bruit.     Trachea: Trachea normal.  Cardiovascular:     Rate and Rhythm: Normal rate and regular rhythm.     Heart  sounds: Normal heart sounds.     Comments: Regular rhythm with intermittent ectopic beats per auscultation Pulmonary:     Effort: Pulmonary effort is normal.     Breath sounds: Normal breath sounds.  Chest:     Chest wall: No mass, lacerations or deformity.     Breasts: Tanner Score is 5.        Right: Normal.        Left: Normal.  Abdominal:     Palpations: Abdomen is soft.  Musculoskeletal:     Cervical back: Normal range of motion and neck supple.     Right lower leg: No edema.     Left lower leg: No edema.  Lymphadenopathy:     Upper Body:     Right upper body: No axillary or pectoral adenopathy.     Left upper body: No axillary or pectoral adenopathy.  Skin:    General: Skin is warm and dry.     Capillary Refill: Capillary refill takes 2 to 3 seconds.  Neurological:     Mental Status: She is alert and oriented to person, place, and time. Mental status is at baseline.     Deep Tendon Reflexes:     Reflex Scores:      Patellar reflexes are 1+ on the right side and 1+ on the left side. Psychiatric:        Attention and Perception: Attention and perception normal.        Mood and Affect: Affect normal. Mood is depressed.        Speech: Speech normal.        Behavior: Behavior normal. Behavior is cooperative.        Thought Content: Thought content normal.        Cognition and Memory: Cognition and memory normal.        Judgment: Judgment normal.   Assessment/Plan: 1. Left carotid stenosis Reviewed carotid doppler with the patient. Carotid doppler showing mild plaque in both carotid arteries. There is <50% stenosis on right side and 50-60% stenosis on left. Will repeat the study in one year for surveillance.  -  US Carotid Bilateral; Future  2. Atherosclerosis of aorta ([place]) Reviewed carotid doppler with the patient. Carotid doppler showing mild plaque in both carotid arteries. There is <50% stenosis on right side and 50-60% stenosis on left. Will repeat the study in one  year for surveillance.  - US Carotid Bilateral; Future  3. Multinodular goiter Reviewed thyroid ultrasound showing multiple nodules on both lobes of the thyroid. All measure <1.5cm in diameter. Will repeat thyroid ultrasound in 6 months for surveillance. Refer to endocrinology or ENT as indicated.   - US THYROID; Future  4. Essential hypertension Elevated blood pressure today. Generally elevated on dialysis days as she is not able to take medications. Will take medication as prescribed when she gets home.   5. End stage renal disease ([place]) Continue hemodialysis treatments as scheduled.   6. Left foot pain Advised she rest, ice, and elevate her foot when possible. May take tylenol as needed and as indicated to help control pain. Reassess at next visit.   General Counseling: Tamra verbalizes understanding of the findings of todays visit and agrees with plan of treatment. I have discussed any further diagnostic evaluation that may be needed or ordered today. We also reviewed her medications today. she has been encouraged to call the office with any questions or concerns that should arise related to todays visit.    Orders Placed This Encounter  Procedures  . US THYROID  . US Carotid Bilateral    Hypertension Counseling:   The following hypertensive lifestyle modification were recommended and discussed:  1. Limiting alcohol intake to less than 1 oz/day of ethanol:(24 oz of beer or 8 oz of wine or 2 oz of 100-proof whiskey). 2. Take baby ASA 81 mg daily. 3. Importance of regular aerobic exercise and losing weight. 4. Reduce dietary saturated fat and cholesterol intake for overall cardiovascular health. 5. Maintaining adequate dietary potassium, calcium, and magnesium intake. 6. Regular monitoring of the blood pressure. 7. Reduce sodium intake to less than 100 mmol/day (less than 2.3 gm of sodium or less than 6 gm of sodium choride)   This patient was seen by [place] with Dr Lavera Guise as a part of collaborative care agreement  Total time spent: 30 Minutes   Time spent includes review of chart, medications, test results, and follow up plan with the patient.      Dr Lavera Guise Internal medicine

## 2020-09-26 NOTE — Progress Notes (Signed)
Reviewed with patient during visit. Will monitor closely.

## 2020-09-27 ENCOUNTER — Other Ambulatory Visit: Payer: Self-pay | Admitting: Nurse Practitioner

## 2020-09-27 DIAGNOSIS — R52 Pain, unspecified: Secondary | ICD-10-CM | POA: Diagnosis not present

## 2020-09-27 DIAGNOSIS — D509 Iron deficiency anemia, unspecified: Secondary | ICD-10-CM | POA: Diagnosis not present

## 2020-09-27 DIAGNOSIS — N2581 Secondary hyperparathyroidism of renal origin: Secondary | ICD-10-CM | POA: Diagnosis not present

## 2020-09-27 DIAGNOSIS — Z992 Dependence on renal dialysis: Secondary | ICD-10-CM | POA: Diagnosis not present

## 2020-09-27 DIAGNOSIS — D689 Coagulation defect, unspecified: Secondary | ICD-10-CM | POA: Diagnosis not present

## 2020-09-27 DIAGNOSIS — M79672 Pain in left foot: Secondary | ICD-10-CM | POA: Diagnosis not present

## 2020-09-27 DIAGNOSIS — N186 End stage renal disease: Secondary | ICD-10-CM | POA: Diagnosis not present

## 2020-09-27 DIAGNOSIS — E1129 Type 2 diabetes mellitus with other diabetic kidney complication: Secondary | ICD-10-CM | POA: Diagnosis not present

## 2020-09-28 LAB — URIC ACID: Uric Acid: 1.7 mg/dL — ABNORMAL LOW (ref 3.1–7.9)

## 2020-09-28 LAB — SEDIMENTATION RATE: Sed Rate: 57 mm/hr — ABNORMAL HIGH (ref 0–40)

## 2020-09-29 DIAGNOSIS — R52 Pain, unspecified: Secondary | ICD-10-CM | POA: Diagnosis not present

## 2020-09-29 DIAGNOSIS — D689 Coagulation defect, unspecified: Secondary | ICD-10-CM | POA: Diagnosis not present

## 2020-09-29 DIAGNOSIS — N2581 Secondary hyperparathyroidism of renal origin: Secondary | ICD-10-CM | POA: Diagnosis not present

## 2020-09-29 DIAGNOSIS — D509 Iron deficiency anemia, unspecified: Secondary | ICD-10-CM | POA: Diagnosis not present

## 2020-09-29 DIAGNOSIS — Z992 Dependence on renal dialysis: Secondary | ICD-10-CM | POA: Diagnosis not present

## 2020-09-29 DIAGNOSIS — N186 End stage renal disease: Secondary | ICD-10-CM | POA: Diagnosis not present

## 2020-09-29 DIAGNOSIS — E1129 Type 2 diabetes mellitus with other diabetic kidney complication: Secondary | ICD-10-CM | POA: Diagnosis not present

## 2020-10-01 DIAGNOSIS — R52 Pain, unspecified: Secondary | ICD-10-CM | POA: Diagnosis not present

## 2020-10-01 DIAGNOSIS — N186 End stage renal disease: Secondary | ICD-10-CM | POA: Diagnosis not present

## 2020-10-01 DIAGNOSIS — N2581 Secondary hyperparathyroidism of renal origin: Secondary | ICD-10-CM | POA: Diagnosis not present

## 2020-10-01 DIAGNOSIS — Z992 Dependence on renal dialysis: Secondary | ICD-10-CM | POA: Diagnosis not present

## 2020-10-01 DIAGNOSIS — D509 Iron deficiency anemia, unspecified: Secondary | ICD-10-CM | POA: Diagnosis not present

## 2020-10-01 DIAGNOSIS — D689 Coagulation defect, unspecified: Secondary | ICD-10-CM | POA: Diagnosis not present

## 2020-10-01 DIAGNOSIS — E1129 Type 2 diabetes mellitus with other diabetic kidney complication: Secondary | ICD-10-CM | POA: Diagnosis not present

## 2020-10-03 DIAGNOSIS — N040 Nephrotic syndrome with minor glomerular abnormality: Secondary | ICD-10-CM | POA: Diagnosis not present

## 2020-10-03 DIAGNOSIS — Z992 Dependence on renal dialysis: Secondary | ICD-10-CM | POA: Diagnosis not present

## 2020-10-03 DIAGNOSIS — N186 End stage renal disease: Secondary | ICD-10-CM | POA: Diagnosis not present

## 2020-10-03 NOTE — Progress Notes (Signed)
Please let the patient know that her uric acid level is on low side and not elevated. Evidence of inflammation is improved. We will continue to monitor. Thanks.

## 2020-10-04 DIAGNOSIS — D509 Iron deficiency anemia, unspecified: Secondary | ICD-10-CM | POA: Diagnosis not present

## 2020-10-04 DIAGNOSIS — D689 Coagulation defect, unspecified: Secondary | ICD-10-CM | POA: Diagnosis not present

## 2020-10-04 DIAGNOSIS — N186 End stage renal disease: Secondary | ICD-10-CM | POA: Diagnosis not present

## 2020-10-04 DIAGNOSIS — R52 Pain, unspecified: Secondary | ICD-10-CM | POA: Diagnosis not present

## 2020-10-04 DIAGNOSIS — N2581 Secondary hyperparathyroidism of renal origin: Secondary | ICD-10-CM | POA: Diagnosis not present

## 2020-10-04 DIAGNOSIS — Z992 Dependence on renal dialysis: Secondary | ICD-10-CM | POA: Diagnosis not present

## 2020-10-05 ENCOUNTER — Telehealth: Payer: Self-pay

## 2020-10-05 NOTE — Telephone Encounter (Signed)
Pt.notified

## 2020-10-05 NOTE — Telephone Encounter (Signed)
-----   Message from Ronnell Freshwater, NP sent at 10/03/2020  9:34 PM EDT ----- Please let the patient know that her uric acid level is on low side and not elevated. Evidence of inflammation is improved. We will continue to monitor. Thanks.

## 2020-10-06 DIAGNOSIS — Z992 Dependence on renal dialysis: Secondary | ICD-10-CM | POA: Diagnosis not present

## 2020-10-06 DIAGNOSIS — D509 Iron deficiency anemia, unspecified: Secondary | ICD-10-CM | POA: Diagnosis not present

## 2020-10-06 DIAGNOSIS — N2581 Secondary hyperparathyroidism of renal origin: Secondary | ICD-10-CM | POA: Diagnosis not present

## 2020-10-06 DIAGNOSIS — N186 End stage renal disease: Secondary | ICD-10-CM | POA: Diagnosis not present

## 2020-10-06 DIAGNOSIS — D689 Coagulation defect, unspecified: Secondary | ICD-10-CM | POA: Diagnosis not present

## 2020-10-06 DIAGNOSIS — R52 Pain, unspecified: Secondary | ICD-10-CM | POA: Diagnosis not present

## 2020-10-08 DIAGNOSIS — N2581 Secondary hyperparathyroidism of renal origin: Secondary | ICD-10-CM | POA: Diagnosis not present

## 2020-10-08 DIAGNOSIS — D689 Coagulation defect, unspecified: Secondary | ICD-10-CM | POA: Diagnosis not present

## 2020-10-08 DIAGNOSIS — N186 End stage renal disease: Secondary | ICD-10-CM | POA: Diagnosis not present

## 2020-10-08 DIAGNOSIS — R52 Pain, unspecified: Secondary | ICD-10-CM | POA: Diagnosis not present

## 2020-10-08 DIAGNOSIS — D509 Iron deficiency anemia, unspecified: Secondary | ICD-10-CM | POA: Diagnosis not present

## 2020-10-08 DIAGNOSIS — Z992 Dependence on renal dialysis: Secondary | ICD-10-CM | POA: Diagnosis not present

## 2020-10-11 DIAGNOSIS — D689 Coagulation defect, unspecified: Secondary | ICD-10-CM | POA: Diagnosis not present

## 2020-10-11 DIAGNOSIS — D509 Iron deficiency anemia, unspecified: Secondary | ICD-10-CM | POA: Diagnosis not present

## 2020-10-11 DIAGNOSIS — N186 End stage renal disease: Secondary | ICD-10-CM | POA: Diagnosis not present

## 2020-10-11 DIAGNOSIS — N2581 Secondary hyperparathyroidism of renal origin: Secondary | ICD-10-CM | POA: Diagnosis not present

## 2020-10-11 DIAGNOSIS — Z992 Dependence on renal dialysis: Secondary | ICD-10-CM | POA: Diagnosis not present

## 2020-10-11 DIAGNOSIS — R52 Pain, unspecified: Secondary | ICD-10-CM | POA: Diagnosis not present

## 2020-10-13 DIAGNOSIS — D689 Coagulation defect, unspecified: Secondary | ICD-10-CM | POA: Diagnosis not present

## 2020-10-13 DIAGNOSIS — N186 End stage renal disease: Secondary | ICD-10-CM | POA: Diagnosis not present

## 2020-10-13 DIAGNOSIS — N2581 Secondary hyperparathyroidism of renal origin: Secondary | ICD-10-CM | POA: Diagnosis not present

## 2020-10-13 DIAGNOSIS — D509 Iron deficiency anemia, unspecified: Secondary | ICD-10-CM | POA: Diagnosis not present

## 2020-10-13 DIAGNOSIS — Z992 Dependence on renal dialysis: Secondary | ICD-10-CM | POA: Diagnosis not present

## 2020-10-13 DIAGNOSIS — R52 Pain, unspecified: Secondary | ICD-10-CM | POA: Diagnosis not present

## 2020-10-15 DIAGNOSIS — D509 Iron deficiency anemia, unspecified: Secondary | ICD-10-CM | POA: Diagnosis not present

## 2020-10-15 DIAGNOSIS — R52 Pain, unspecified: Secondary | ICD-10-CM | POA: Diagnosis not present

## 2020-10-15 DIAGNOSIS — D689 Coagulation defect, unspecified: Secondary | ICD-10-CM | POA: Diagnosis not present

## 2020-10-15 DIAGNOSIS — Z992 Dependence on renal dialysis: Secondary | ICD-10-CM | POA: Diagnosis not present

## 2020-10-15 DIAGNOSIS — N2581 Secondary hyperparathyroidism of renal origin: Secondary | ICD-10-CM | POA: Diagnosis not present

## 2020-10-15 DIAGNOSIS — N186 End stage renal disease: Secondary | ICD-10-CM | POA: Diagnosis not present

## 2020-10-16 DIAGNOSIS — M79672 Pain in left foot: Secondary | ICD-10-CM | POA: Insufficient documentation

## 2020-10-18 DIAGNOSIS — N2581 Secondary hyperparathyroidism of renal origin: Secondary | ICD-10-CM | POA: Diagnosis not present

## 2020-10-18 DIAGNOSIS — N186 End stage renal disease: Secondary | ICD-10-CM | POA: Diagnosis not present

## 2020-10-18 DIAGNOSIS — D509 Iron deficiency anemia, unspecified: Secondary | ICD-10-CM | POA: Diagnosis not present

## 2020-10-18 DIAGNOSIS — D689 Coagulation defect, unspecified: Secondary | ICD-10-CM | POA: Diagnosis not present

## 2020-10-18 DIAGNOSIS — R52 Pain, unspecified: Secondary | ICD-10-CM | POA: Diagnosis not present

## 2020-10-18 DIAGNOSIS — Z992 Dependence on renal dialysis: Secondary | ICD-10-CM | POA: Diagnosis not present

## 2020-10-20 DIAGNOSIS — D689 Coagulation defect, unspecified: Secondary | ICD-10-CM | POA: Diagnosis not present

## 2020-10-20 DIAGNOSIS — Z992 Dependence on renal dialysis: Secondary | ICD-10-CM | POA: Diagnosis not present

## 2020-10-20 DIAGNOSIS — N2581 Secondary hyperparathyroidism of renal origin: Secondary | ICD-10-CM | POA: Diagnosis not present

## 2020-10-20 DIAGNOSIS — N186 End stage renal disease: Secondary | ICD-10-CM | POA: Diagnosis not present

## 2020-10-20 DIAGNOSIS — D509 Iron deficiency anemia, unspecified: Secondary | ICD-10-CM | POA: Diagnosis not present

## 2020-10-20 DIAGNOSIS — R52 Pain, unspecified: Secondary | ICD-10-CM | POA: Diagnosis not present

## 2020-10-22 DIAGNOSIS — Z992 Dependence on renal dialysis: Secondary | ICD-10-CM | POA: Diagnosis not present

## 2020-10-22 DIAGNOSIS — R52 Pain, unspecified: Secondary | ICD-10-CM | POA: Diagnosis not present

## 2020-10-22 DIAGNOSIS — D689 Coagulation defect, unspecified: Secondary | ICD-10-CM | POA: Diagnosis not present

## 2020-10-22 DIAGNOSIS — N186 End stage renal disease: Secondary | ICD-10-CM | POA: Diagnosis not present

## 2020-10-22 DIAGNOSIS — D509 Iron deficiency anemia, unspecified: Secondary | ICD-10-CM | POA: Diagnosis not present

## 2020-10-22 DIAGNOSIS — N2581 Secondary hyperparathyroidism of renal origin: Secondary | ICD-10-CM | POA: Diagnosis not present

## 2020-10-24 DIAGNOSIS — D689 Coagulation defect, unspecified: Secondary | ICD-10-CM | POA: Diagnosis not present

## 2020-10-24 DIAGNOSIS — Z992 Dependence on renal dialysis: Secondary | ICD-10-CM | POA: Diagnosis not present

## 2020-10-24 DIAGNOSIS — N186 End stage renal disease: Secondary | ICD-10-CM | POA: Diagnosis not present

## 2020-10-24 DIAGNOSIS — N2581 Secondary hyperparathyroidism of renal origin: Secondary | ICD-10-CM | POA: Diagnosis not present

## 2020-10-24 DIAGNOSIS — D509 Iron deficiency anemia, unspecified: Secondary | ICD-10-CM | POA: Diagnosis not present

## 2020-10-24 DIAGNOSIS — R52 Pain, unspecified: Secondary | ICD-10-CM | POA: Diagnosis not present

## 2020-10-27 DIAGNOSIS — D509 Iron deficiency anemia, unspecified: Secondary | ICD-10-CM | POA: Diagnosis not present

## 2020-10-27 DIAGNOSIS — N186 End stage renal disease: Secondary | ICD-10-CM | POA: Diagnosis not present

## 2020-10-27 DIAGNOSIS — R52 Pain, unspecified: Secondary | ICD-10-CM | POA: Diagnosis not present

## 2020-10-27 DIAGNOSIS — D689 Coagulation defect, unspecified: Secondary | ICD-10-CM | POA: Diagnosis not present

## 2020-10-27 DIAGNOSIS — N2581 Secondary hyperparathyroidism of renal origin: Secondary | ICD-10-CM | POA: Diagnosis not present

## 2020-10-27 DIAGNOSIS — Z992 Dependence on renal dialysis: Secondary | ICD-10-CM | POA: Diagnosis not present

## 2020-10-29 DIAGNOSIS — R52 Pain, unspecified: Secondary | ICD-10-CM | POA: Diagnosis not present

## 2020-10-29 DIAGNOSIS — Z992 Dependence on renal dialysis: Secondary | ICD-10-CM | POA: Diagnosis not present

## 2020-10-29 DIAGNOSIS — N2581 Secondary hyperparathyroidism of renal origin: Secondary | ICD-10-CM | POA: Diagnosis not present

## 2020-10-29 DIAGNOSIS — D689 Coagulation defect, unspecified: Secondary | ICD-10-CM | POA: Diagnosis not present

## 2020-10-29 DIAGNOSIS — N186 End stage renal disease: Secondary | ICD-10-CM | POA: Diagnosis not present

## 2020-10-29 DIAGNOSIS — D509 Iron deficiency anemia, unspecified: Secondary | ICD-10-CM | POA: Diagnosis not present

## 2020-11-01 DIAGNOSIS — R52 Pain, unspecified: Secondary | ICD-10-CM | POA: Diagnosis not present

## 2020-11-01 DIAGNOSIS — D509 Iron deficiency anemia, unspecified: Secondary | ICD-10-CM | POA: Diagnosis not present

## 2020-11-01 DIAGNOSIS — D689 Coagulation defect, unspecified: Secondary | ICD-10-CM | POA: Diagnosis not present

## 2020-11-01 DIAGNOSIS — N186 End stage renal disease: Secondary | ICD-10-CM | POA: Diagnosis not present

## 2020-11-01 DIAGNOSIS — Z992 Dependence on renal dialysis: Secondary | ICD-10-CM | POA: Diagnosis not present

## 2020-11-01 DIAGNOSIS — N2581 Secondary hyperparathyroidism of renal origin: Secondary | ICD-10-CM | POA: Diagnosis not present

## 2020-11-02 DIAGNOSIS — N040 Nephrotic syndrome with minor glomerular abnormality: Secondary | ICD-10-CM | POA: Diagnosis not present

## 2020-11-02 DIAGNOSIS — Z992 Dependence on renal dialysis: Secondary | ICD-10-CM | POA: Diagnosis not present

## 2020-11-02 DIAGNOSIS — N186 End stage renal disease: Secondary | ICD-10-CM | POA: Diagnosis not present

## 2020-11-03 DIAGNOSIS — N186 End stage renal disease: Secondary | ICD-10-CM | POA: Diagnosis not present

## 2020-11-03 DIAGNOSIS — Z992 Dependence on renal dialysis: Secondary | ICD-10-CM | POA: Diagnosis not present

## 2020-11-03 DIAGNOSIS — N2581 Secondary hyperparathyroidism of renal origin: Secondary | ICD-10-CM | POA: Diagnosis not present

## 2020-11-03 DIAGNOSIS — D509 Iron deficiency anemia, unspecified: Secondary | ICD-10-CM | POA: Diagnosis not present

## 2020-11-03 DIAGNOSIS — D631 Anemia in chronic kidney disease: Secondary | ICD-10-CM | POA: Diagnosis not present

## 2020-11-03 DIAGNOSIS — D689 Coagulation defect, unspecified: Secondary | ICD-10-CM | POA: Diagnosis not present

## 2020-11-03 DIAGNOSIS — R519 Headache, unspecified: Secondary | ICD-10-CM | POA: Diagnosis not present

## 2020-11-05 DIAGNOSIS — D631 Anemia in chronic kidney disease: Secondary | ICD-10-CM | POA: Diagnosis not present

## 2020-11-05 DIAGNOSIS — Z992 Dependence on renal dialysis: Secondary | ICD-10-CM | POA: Diagnosis not present

## 2020-11-05 DIAGNOSIS — D689 Coagulation defect, unspecified: Secondary | ICD-10-CM | POA: Diagnosis not present

## 2020-11-05 DIAGNOSIS — D509 Iron deficiency anemia, unspecified: Secondary | ICD-10-CM | POA: Diagnosis not present

## 2020-11-05 DIAGNOSIS — N2581 Secondary hyperparathyroidism of renal origin: Secondary | ICD-10-CM | POA: Diagnosis not present

## 2020-11-05 DIAGNOSIS — R519 Headache, unspecified: Secondary | ICD-10-CM | POA: Diagnosis not present

## 2020-11-05 DIAGNOSIS — N186 End stage renal disease: Secondary | ICD-10-CM | POA: Diagnosis not present

## 2020-11-08 DIAGNOSIS — Z992 Dependence on renal dialysis: Secondary | ICD-10-CM | POA: Diagnosis not present

## 2020-11-08 DIAGNOSIS — R519 Headache, unspecified: Secondary | ICD-10-CM | POA: Diagnosis not present

## 2020-11-08 DIAGNOSIS — N2581 Secondary hyperparathyroidism of renal origin: Secondary | ICD-10-CM | POA: Diagnosis not present

## 2020-11-08 DIAGNOSIS — D509 Iron deficiency anemia, unspecified: Secondary | ICD-10-CM | POA: Diagnosis not present

## 2020-11-08 DIAGNOSIS — D631 Anemia in chronic kidney disease: Secondary | ICD-10-CM | POA: Diagnosis not present

## 2020-11-08 DIAGNOSIS — N186 End stage renal disease: Secondary | ICD-10-CM | POA: Diagnosis not present

## 2020-11-08 DIAGNOSIS — D689 Coagulation defect, unspecified: Secondary | ICD-10-CM | POA: Diagnosis not present

## 2020-11-10 DIAGNOSIS — N2581 Secondary hyperparathyroidism of renal origin: Secondary | ICD-10-CM | POA: Diagnosis not present

## 2020-11-10 DIAGNOSIS — R519 Headache, unspecified: Secondary | ICD-10-CM | POA: Diagnosis not present

## 2020-11-10 DIAGNOSIS — D509 Iron deficiency anemia, unspecified: Secondary | ICD-10-CM | POA: Diagnosis not present

## 2020-11-10 DIAGNOSIS — N186 End stage renal disease: Secondary | ICD-10-CM | POA: Diagnosis not present

## 2020-11-10 DIAGNOSIS — D689 Coagulation defect, unspecified: Secondary | ICD-10-CM | POA: Diagnosis not present

## 2020-11-10 DIAGNOSIS — D631 Anemia in chronic kidney disease: Secondary | ICD-10-CM | POA: Diagnosis not present

## 2020-11-10 DIAGNOSIS — Z992 Dependence on renal dialysis: Secondary | ICD-10-CM | POA: Diagnosis not present

## 2020-11-12 DIAGNOSIS — D631 Anemia in chronic kidney disease: Secondary | ICD-10-CM | POA: Diagnosis not present

## 2020-11-12 DIAGNOSIS — N186 End stage renal disease: Secondary | ICD-10-CM | POA: Diagnosis not present

## 2020-11-12 DIAGNOSIS — D689 Coagulation defect, unspecified: Secondary | ICD-10-CM | POA: Diagnosis not present

## 2020-11-12 DIAGNOSIS — R519 Headache, unspecified: Secondary | ICD-10-CM | POA: Diagnosis not present

## 2020-11-12 DIAGNOSIS — Z992 Dependence on renal dialysis: Secondary | ICD-10-CM | POA: Diagnosis not present

## 2020-11-12 DIAGNOSIS — N2581 Secondary hyperparathyroidism of renal origin: Secondary | ICD-10-CM | POA: Diagnosis not present

## 2020-11-12 DIAGNOSIS — D509 Iron deficiency anemia, unspecified: Secondary | ICD-10-CM | POA: Diagnosis not present

## 2020-11-15 DIAGNOSIS — D509 Iron deficiency anemia, unspecified: Secondary | ICD-10-CM | POA: Diagnosis not present

## 2020-11-15 DIAGNOSIS — D689 Coagulation defect, unspecified: Secondary | ICD-10-CM | POA: Diagnosis not present

## 2020-11-15 DIAGNOSIS — Z992 Dependence on renal dialysis: Secondary | ICD-10-CM | POA: Diagnosis not present

## 2020-11-15 DIAGNOSIS — N186 End stage renal disease: Secondary | ICD-10-CM | POA: Diagnosis not present

## 2020-11-15 DIAGNOSIS — R519 Headache, unspecified: Secondary | ICD-10-CM | POA: Diagnosis not present

## 2020-11-15 DIAGNOSIS — N2581 Secondary hyperparathyroidism of renal origin: Secondary | ICD-10-CM | POA: Diagnosis not present

## 2020-11-15 DIAGNOSIS — D631 Anemia in chronic kidney disease: Secondary | ICD-10-CM | POA: Diagnosis not present

## 2020-11-17 DIAGNOSIS — D631 Anemia in chronic kidney disease: Secondary | ICD-10-CM | POA: Diagnosis not present

## 2020-11-17 DIAGNOSIS — N2581 Secondary hyperparathyroidism of renal origin: Secondary | ICD-10-CM | POA: Diagnosis not present

## 2020-11-17 DIAGNOSIS — D509 Iron deficiency anemia, unspecified: Secondary | ICD-10-CM | POA: Diagnosis not present

## 2020-11-17 DIAGNOSIS — N186 End stage renal disease: Secondary | ICD-10-CM | POA: Diagnosis not present

## 2020-11-17 DIAGNOSIS — Z992 Dependence on renal dialysis: Secondary | ICD-10-CM | POA: Diagnosis not present

## 2020-11-17 DIAGNOSIS — D689 Coagulation defect, unspecified: Secondary | ICD-10-CM | POA: Diagnosis not present

## 2020-11-17 DIAGNOSIS — R519 Headache, unspecified: Secondary | ICD-10-CM | POA: Diagnosis not present

## 2020-11-19 DIAGNOSIS — N2581 Secondary hyperparathyroidism of renal origin: Secondary | ICD-10-CM | POA: Diagnosis not present

## 2020-11-19 DIAGNOSIS — D631 Anemia in chronic kidney disease: Secondary | ICD-10-CM | POA: Diagnosis not present

## 2020-11-19 DIAGNOSIS — D689 Coagulation defect, unspecified: Secondary | ICD-10-CM | POA: Diagnosis not present

## 2020-11-19 DIAGNOSIS — D509 Iron deficiency anemia, unspecified: Secondary | ICD-10-CM | POA: Diagnosis not present

## 2020-11-19 DIAGNOSIS — R519 Headache, unspecified: Secondary | ICD-10-CM | POA: Diagnosis not present

## 2020-11-19 DIAGNOSIS — Z992 Dependence on renal dialysis: Secondary | ICD-10-CM | POA: Diagnosis not present

## 2020-11-19 DIAGNOSIS — N186 End stage renal disease: Secondary | ICD-10-CM | POA: Diagnosis not present

## 2020-11-22 DIAGNOSIS — R519 Headache, unspecified: Secondary | ICD-10-CM | POA: Diagnosis not present

## 2020-11-22 DIAGNOSIS — D689 Coagulation defect, unspecified: Secondary | ICD-10-CM | POA: Diagnosis not present

## 2020-11-22 DIAGNOSIS — N2581 Secondary hyperparathyroidism of renal origin: Secondary | ICD-10-CM | POA: Diagnosis not present

## 2020-11-22 DIAGNOSIS — N186 End stage renal disease: Secondary | ICD-10-CM | POA: Diagnosis not present

## 2020-11-22 DIAGNOSIS — Z992 Dependence on renal dialysis: Secondary | ICD-10-CM | POA: Diagnosis not present

## 2020-11-22 DIAGNOSIS — D631 Anemia in chronic kidney disease: Secondary | ICD-10-CM | POA: Diagnosis not present

## 2020-11-22 DIAGNOSIS — D509 Iron deficiency anemia, unspecified: Secondary | ICD-10-CM | POA: Diagnosis not present

## 2020-11-24 DIAGNOSIS — D689 Coagulation defect, unspecified: Secondary | ICD-10-CM | POA: Diagnosis not present

## 2020-11-24 DIAGNOSIS — R519 Headache, unspecified: Secondary | ICD-10-CM | POA: Diagnosis not present

## 2020-11-24 DIAGNOSIS — N2581 Secondary hyperparathyroidism of renal origin: Secondary | ICD-10-CM | POA: Diagnosis not present

## 2020-11-24 DIAGNOSIS — N186 End stage renal disease: Secondary | ICD-10-CM | POA: Diagnosis not present

## 2020-11-24 DIAGNOSIS — D631 Anemia in chronic kidney disease: Secondary | ICD-10-CM | POA: Diagnosis not present

## 2020-11-24 DIAGNOSIS — D509 Iron deficiency anemia, unspecified: Secondary | ICD-10-CM | POA: Diagnosis not present

## 2020-11-24 DIAGNOSIS — Z992 Dependence on renal dialysis: Secondary | ICD-10-CM | POA: Diagnosis not present

## 2020-11-26 DIAGNOSIS — R519 Headache, unspecified: Secondary | ICD-10-CM | POA: Diagnosis not present

## 2020-11-26 DIAGNOSIS — N2581 Secondary hyperparathyroidism of renal origin: Secondary | ICD-10-CM | POA: Diagnosis not present

## 2020-11-26 DIAGNOSIS — D689 Coagulation defect, unspecified: Secondary | ICD-10-CM | POA: Diagnosis not present

## 2020-11-26 DIAGNOSIS — D631 Anemia in chronic kidney disease: Secondary | ICD-10-CM | POA: Diagnosis not present

## 2020-11-26 DIAGNOSIS — D509 Iron deficiency anemia, unspecified: Secondary | ICD-10-CM | POA: Diagnosis not present

## 2020-11-26 DIAGNOSIS — Z992 Dependence on renal dialysis: Secondary | ICD-10-CM | POA: Diagnosis not present

## 2020-11-26 DIAGNOSIS — N186 End stage renal disease: Secondary | ICD-10-CM | POA: Diagnosis not present

## 2020-11-29 DIAGNOSIS — N186 End stage renal disease: Secondary | ICD-10-CM | POA: Diagnosis not present

## 2020-11-29 DIAGNOSIS — D509 Iron deficiency anemia, unspecified: Secondary | ICD-10-CM | POA: Diagnosis not present

## 2020-11-29 DIAGNOSIS — D689 Coagulation defect, unspecified: Secondary | ICD-10-CM | POA: Diagnosis not present

## 2020-11-29 DIAGNOSIS — Z992 Dependence on renal dialysis: Secondary | ICD-10-CM | POA: Diagnosis not present

## 2020-11-29 DIAGNOSIS — D631 Anemia in chronic kidney disease: Secondary | ICD-10-CM | POA: Diagnosis not present

## 2020-11-29 DIAGNOSIS — R519 Headache, unspecified: Secondary | ICD-10-CM | POA: Diagnosis not present

## 2020-11-29 DIAGNOSIS — N2581 Secondary hyperparathyroidism of renal origin: Secondary | ICD-10-CM | POA: Diagnosis not present

## 2020-12-01 DIAGNOSIS — D509 Iron deficiency anemia, unspecified: Secondary | ICD-10-CM | POA: Diagnosis not present

## 2020-12-01 DIAGNOSIS — D631 Anemia in chronic kidney disease: Secondary | ICD-10-CM | POA: Diagnosis not present

## 2020-12-01 DIAGNOSIS — D689 Coagulation defect, unspecified: Secondary | ICD-10-CM | POA: Diagnosis not present

## 2020-12-01 DIAGNOSIS — R519 Headache, unspecified: Secondary | ICD-10-CM | POA: Diagnosis not present

## 2020-12-01 DIAGNOSIS — N2581 Secondary hyperparathyroidism of renal origin: Secondary | ICD-10-CM | POA: Diagnosis not present

## 2020-12-01 DIAGNOSIS — N186 End stage renal disease: Secondary | ICD-10-CM | POA: Diagnosis not present

## 2020-12-01 DIAGNOSIS — Z992 Dependence on renal dialysis: Secondary | ICD-10-CM | POA: Diagnosis not present

## 2020-12-03 DIAGNOSIS — R519 Headache, unspecified: Secondary | ICD-10-CM | POA: Diagnosis not present

## 2020-12-03 DIAGNOSIS — D631 Anemia in chronic kidney disease: Secondary | ICD-10-CM | POA: Diagnosis not present

## 2020-12-03 DIAGNOSIS — D509 Iron deficiency anemia, unspecified: Secondary | ICD-10-CM | POA: Diagnosis not present

## 2020-12-03 DIAGNOSIS — N2581 Secondary hyperparathyroidism of renal origin: Secondary | ICD-10-CM | POA: Diagnosis not present

## 2020-12-03 DIAGNOSIS — D689 Coagulation defect, unspecified: Secondary | ICD-10-CM | POA: Diagnosis not present

## 2020-12-03 DIAGNOSIS — N186 End stage renal disease: Secondary | ICD-10-CM | POA: Diagnosis not present

## 2020-12-03 DIAGNOSIS — N040 Nephrotic syndrome with minor glomerular abnormality: Secondary | ICD-10-CM | POA: Diagnosis not present

## 2020-12-03 DIAGNOSIS — Z992 Dependence on renal dialysis: Secondary | ICD-10-CM | POA: Diagnosis not present

## 2020-12-06 DIAGNOSIS — N2581 Secondary hyperparathyroidism of renal origin: Secondary | ICD-10-CM | POA: Diagnosis not present

## 2020-12-06 DIAGNOSIS — D689 Coagulation defect, unspecified: Secondary | ICD-10-CM | POA: Diagnosis not present

## 2020-12-06 DIAGNOSIS — Z992 Dependence on renal dialysis: Secondary | ICD-10-CM | POA: Diagnosis not present

## 2020-12-06 DIAGNOSIS — D509 Iron deficiency anemia, unspecified: Secondary | ICD-10-CM | POA: Diagnosis not present

## 2020-12-06 DIAGNOSIS — D631 Anemia in chronic kidney disease: Secondary | ICD-10-CM | POA: Diagnosis not present

## 2020-12-06 DIAGNOSIS — N186 End stage renal disease: Secondary | ICD-10-CM | POA: Diagnosis not present

## 2020-12-07 ENCOUNTER — Telehealth: Payer: Self-pay

## 2020-12-07 NOTE — Telephone Encounter (Signed)
Lmom to rs cancelled appointment for 12-09-20 upon patients request.

## 2020-12-08 DIAGNOSIS — N186 End stage renal disease: Secondary | ICD-10-CM | POA: Diagnosis not present

## 2020-12-08 DIAGNOSIS — D631 Anemia in chronic kidney disease: Secondary | ICD-10-CM | POA: Diagnosis not present

## 2020-12-08 DIAGNOSIS — D509 Iron deficiency anemia, unspecified: Secondary | ICD-10-CM | POA: Diagnosis not present

## 2020-12-08 DIAGNOSIS — Z992 Dependence on renal dialysis: Secondary | ICD-10-CM | POA: Diagnosis not present

## 2020-12-08 DIAGNOSIS — N2581 Secondary hyperparathyroidism of renal origin: Secondary | ICD-10-CM | POA: Diagnosis not present

## 2020-12-08 DIAGNOSIS — D689 Coagulation defect, unspecified: Secondary | ICD-10-CM | POA: Diagnosis not present

## 2020-12-09 ENCOUNTER — Ambulatory Visit: Payer: Medicare Other | Admitting: Nurse Practitioner

## 2020-12-10 DIAGNOSIS — N186 End stage renal disease: Secondary | ICD-10-CM | POA: Diagnosis not present

## 2020-12-10 DIAGNOSIS — D689 Coagulation defect, unspecified: Secondary | ICD-10-CM | POA: Diagnosis not present

## 2020-12-10 DIAGNOSIS — D631 Anemia in chronic kidney disease: Secondary | ICD-10-CM | POA: Diagnosis not present

## 2020-12-10 DIAGNOSIS — D509 Iron deficiency anemia, unspecified: Secondary | ICD-10-CM | POA: Diagnosis not present

## 2020-12-10 DIAGNOSIS — N2581 Secondary hyperparathyroidism of renal origin: Secondary | ICD-10-CM | POA: Diagnosis not present

## 2020-12-10 DIAGNOSIS — Z992 Dependence on renal dialysis: Secondary | ICD-10-CM | POA: Diagnosis not present

## 2020-12-13 DIAGNOSIS — D509 Iron deficiency anemia, unspecified: Secondary | ICD-10-CM | POA: Diagnosis not present

## 2020-12-13 DIAGNOSIS — Z992 Dependence on renal dialysis: Secondary | ICD-10-CM | POA: Diagnosis not present

## 2020-12-13 DIAGNOSIS — N2581 Secondary hyperparathyroidism of renal origin: Secondary | ICD-10-CM | POA: Diagnosis not present

## 2020-12-13 DIAGNOSIS — D689 Coagulation defect, unspecified: Secondary | ICD-10-CM | POA: Diagnosis not present

## 2020-12-13 DIAGNOSIS — N186 End stage renal disease: Secondary | ICD-10-CM | POA: Diagnosis not present

## 2020-12-13 DIAGNOSIS — D631 Anemia in chronic kidney disease: Secondary | ICD-10-CM | POA: Diagnosis not present

## 2020-12-15 DIAGNOSIS — Z992 Dependence on renal dialysis: Secondary | ICD-10-CM | POA: Diagnosis not present

## 2020-12-15 DIAGNOSIS — N2581 Secondary hyperparathyroidism of renal origin: Secondary | ICD-10-CM | POA: Diagnosis not present

## 2020-12-15 DIAGNOSIS — D689 Coagulation defect, unspecified: Secondary | ICD-10-CM | POA: Diagnosis not present

## 2020-12-15 DIAGNOSIS — D631 Anemia in chronic kidney disease: Secondary | ICD-10-CM | POA: Diagnosis not present

## 2020-12-15 DIAGNOSIS — N186 End stage renal disease: Secondary | ICD-10-CM | POA: Diagnosis not present

## 2020-12-15 DIAGNOSIS — D509 Iron deficiency anemia, unspecified: Secondary | ICD-10-CM | POA: Diagnosis not present

## 2020-12-16 ENCOUNTER — Ambulatory Visit (INDEPENDENT_AMBULATORY_CARE_PROVIDER_SITE_OTHER): Payer: Medicare Other | Admitting: Internal Medicine

## 2020-12-16 ENCOUNTER — Encounter: Payer: Self-pay | Admitting: Internal Medicine

## 2020-12-16 ENCOUNTER — Other Ambulatory Visit: Payer: Self-pay

## 2020-12-16 VITALS — BP 156/68 | HR 80 | Temp 97.3°F | Resp 16 | Ht 64.0 in | Wt 118.8 lb

## 2020-12-16 DIAGNOSIS — K219 Gastro-esophageal reflux disease without esophagitis: Secondary | ICD-10-CM | POA: Diagnosis not present

## 2020-12-16 DIAGNOSIS — N186 End stage renal disease: Secondary | ICD-10-CM | POA: Diagnosis not present

## 2020-12-16 DIAGNOSIS — R110 Nausea: Secondary | ICD-10-CM | POA: Diagnosis not present

## 2020-12-16 DIAGNOSIS — I10 Essential (primary) hypertension: Secondary | ICD-10-CM | POA: Diagnosis not present

## 2020-12-16 MED ORDER — OMEPRAZOLE 40 MG PO CPDR: 40.0000 mg | DELAYED_RELEASE_CAPSULE | Freq: Every day | ORAL | 3 refills | Status: DC

## 2020-12-16 MED ORDER — PROMETHAZINE HCL 12.5 MG PO TABS: 12.5000 mg | ORAL_TABLET | Freq: Three times a day (TID) | ORAL | 0 refills | Status: DC | PRN

## 2020-12-16 NOTE — Progress Notes (Signed)
Established Patient Office Visit  Subjective:  Patient ID: Kristen Francis, female    DOB: Jul 30, 1946  Age: 75 y.o. MRN: [id number]  CC:  Chief Complaint  Patient presents with  . Follow-up    Discuss dialysis, pt gets real sick on dialysis days, cant eat, will throw up, weight loss  . Anemia  . Hyperlipidemia  . Hypertension  . Quality Metric Gaps    A1C, pt states she is not diabetic     HPI Kristen Francis presents today for follow up. Reports she is nauseous on dialysis days and can't take her medications on those days. Can eat breakfast, but otherwise not much food intake due to nausea. Occasional belching and refluxing after soda and coffee. Mentions she feels she has lost weight. She reports 40 pound weight loss over 2 years since starting dialysis. Dialysis is going well otherwise. She is adjusting to it.  Past Medical History:  Diagnosis Date  . Anemia   . Arthritis   . Chronic kidney disease    ESRD  . Complication of anesthesia    had procedure and felt incision early 80s  . Gout   . Hyperlipemia   . Hypertension   . Macular degeneration, right eye   . Stroke [facility])     Past Surgical History:  Procedure Laterality Date  . A/V FISTULAGRAM Left 12/25/2018   Procedure: A/V FISTULAGRAM;  Surgeon: Katha Cabal, MD;  Location: [place] [facility];  Service: Cardiovascular;  Laterality: Left;  . ABDOMINAL HYSTERECTOMY    . APPENDECTOMY    . AV FISTULA PLACEMENT Left 07/12/2018   Procedure: INSERTION OF ARTERIOVENOUS (AV) GORE-TEX GRAFT ARM ( BRACHIAL AXILLARY );  Surgeon: Katha Cabal, MD;  Location: [facility] ORS;  Service: Vascular;  Laterality: Left;  . COLONOSCOPY WITH PROPOFOL N/A 01/14/2016   Procedure: COLONOSCOPY WITH PROPOFOL;  Surgeon: Manya Silvas, MD;  Location: [facility] ENDOSCOPY;  Service: Endoscopy;  Laterality: N/A;  . TONSILLECTOMY      Family History  Problem Relation Age of Onset  . Kidney disease Mother   . Breast cancer Neg Hx      Social History   Socioeconomic History  . Marital status: Married    Spouse name: Not on file  . Number of children: Not on file  . Years of education: Not on file  . Highest education level: Not on file  Occupational History  . Not on file  Tobacco Use  . Smoking status: Former Research scientist (life sciences)  . Smokeless tobacco: Never Used  Vaping Use  . Vaping Use: Never used  Substance and Sexual Activity  . Alcohol use: No  . Drug use: No  . Sexual activity: Not on file  Other Topics Concern  . Not on file  Social History Narrative   Lives in  [place]; used to clean; no smoking; no alcohol.    Social Determinants of Health   Financial Resource Strain: Not on file  Food Insecurity: Not on file  Transportation Needs: Not on file  Physical Activity: Not on file  Stress: Not on file  Social Connections: Not on file  Intimate Partner Violence: Not on file    Outpatient Medications Prior to Visit  Medication Sig Dispense Refill  . allopurinol (ZYLOPRIM) 100 MG tablet Take 1 tablet (100 mg total) by mouth daily. 30 tablet 1  . amLODipine (NORVASC) 10 MG tablet Take by mouth.    . B Complex-C-Folic Acid (RENAL-VITE) 0.8 MG TABS Take by mouth.    Marland Kitchen  carvedilol (COREG) 25 MG tablet Take 25 mg by mouth 2 (two) times daily with a meal.    . cholecalciferol (VITAMIN D) 1000 units tablet Take 1,000 Units by mouth daily.     . cinacalcet (SENSIPAR) 60 MG tablet Take by mouth.    . cinacalcet (SENSIPAR) 60 MG tablet Take 60 mg by mouth daily.    . cinacalcet (SENSIPAR) 90 MG tablet Take 90 mg by mouth daily.    . ferrous sulfate 325 (65 FE) MG tablet Take 325 mg by mouth daily.     Marland Kitchen lidocaine-prilocaine (EMLA) cream APPLY SMALL AMOUNT TO ACCESS SITE (AVF) 3 TIMES A WEEK 1 HOUR BEFORE DIALYSIS. COVER WITH OCCLUSIVE DRESSING (SARAN WRAP)  6  . losartan (COZAAR) 50 MG tablet Take 50 mg by mouth daily.    . Methoxy PEG-Epoetin Beta (MIRCERA IJ) Mircera    . Methoxy PEG-Epoetin Beta (MIRCERA IJ)  Mircera    . mirtazapine (REMERON) 7.5 MG tablet Take 1 tablet (7.5 mg total) by mouth at bedtime. 30 tablet 3  . rosuvastatin (CRESTOR) 5 MG tablet Take 1 tablet (5 mg total) by mouth daily. 30 tablet 3  . sevelamer carbonate (RENVELA) 800 MG tablet Take by mouth.    . torsemide (DEMADEX) 5 MG tablet Take 5 mg by mouth daily.   11   No facility-administered medications prior to visit.    Allergies  Allergen Reactions  . Other Other (See Comments)    Pt does not receive Blood   . Penicillins Hives    Has patient had a PCN reaction causing immediate rash, facial/tongue/throat swelling, SOB or lightheadedness with hypotension: YES Has patient had a PCN reaction causing severe rash involving mucus membranes or skin necrosis: no Has patient had a PCN reaction that required hospitalization: no Has patient had a PCN reaction occurring within the last 10 years: no If all of the above answers are "NO", then may proceed with Cephalosporin use.   . Sodium Bicarbonate Itching    ROS Review of Systems  Constitutional: Positive for appetite change and unexpected weight change. Negative for activity change and fever.  HENT: Negative for congestion.   Respiratory: Negative for cough and shortness of breath.   Cardiovascular: Negative for chest pain, palpitations and leg swelling.  Gastrointestinal: Positive for constipation and nausea. Negative for abdominal pain.  Musculoskeletal: Negative for arthralgias.  Skin: Negative for color change.  Neurological: Negative for dizziness, light-headedness and headaches.  Psychiatric/Behavioral: Negative for agitation, behavioral problems and sleep disturbance. The patient is not nervous/anxious.       Objective:    Physical Exam Constitutional:      Appearance: Normal appearance. She is normal weight.  HENT:     Head: Normocephalic and atraumatic.     Nose: Nose normal.  Eyes:     Extraocular Movements: Extraocular movements intact.     Pupils:  Pupils are equal, round, and reactive to light.  Cardiovascular:     Rate and Rhythm: Normal rate and regular rhythm.  Pulmonary:     Effort: Pulmonary effort is normal.     Breath sounds: Normal breath sounds.  Abdominal:     General: Abdomen is flat.     Palpations: Abdomen is soft.  Musculoskeletal:        General: Normal range of motion.     Cervical back: Normal range of motion.  Skin:    General: Skin is warm and dry.  Neurological:     General: No focal deficit present.  Mental Status: She is alert.  Psychiatric:        Mood and Affect: Mood normal.        Behavior: Behavior normal.     BP (!) 156/68   Pulse 80   Temp (!) 97.3 F (36.3 C)   Resp 16   Ht 5\' 4"  (1.626 m)   Wt 118 lb 12.8 oz (53.9 kg)   SpO2 98%   BMI 20.39 kg/m  Wt Readings from Last 3 Encounters:  12/16/20 118 lb 12.8 oz (53.9 kg)  09/24/20 123 lb 6.4 oz (56 kg)  08/19/20 125 lb (56.7 kg)     Health Maintenance Due  Topic Date Due  . HEMOGLOBIN A1C  Never done  . TETANUS/TDAP  Never done    There are no preventive care reminders to display for this patient.  Lab Results  Component Value Date   TSH 1.340 07/02/2020   Lab Results  Component Value Date   WBC 4.5 07/20/2020   HGB 10.4 (L) 07/20/2020   HCT 32.1 (L) 07/20/2020   MCV 87.7 07/20/2020   PLT 226 07/20/2020   Lab Results  Component Value Date   NA 142 07/20/2020   K 4.3 07/20/2020   CO2 32 07/20/2020   GLUCOSE 91 07/20/2020   BUN 29 (H) 07/20/2020   CREATININE 5.63 (H) 07/20/2020   BILITOT 0.4 07/20/2020   ALKPHOS 75 07/20/2020   AST 15 07/20/2020   ALT 10 07/20/2020   PROT 7.6 07/20/2020   ALBUMIN 3.7 07/20/2020   CALCIUM 8.4 (L) 07/20/2020   ANIONGAP 11 07/20/2020   No results found for: CHOL No results found for: HDL No results found for: LDLCALC No results found for: TRIG No results found for: CHOLHDL No results found for: HGBA1C    Assessment & Plan:  1. Nausea Could be due to several factors,  such as reflux, dyskinesia of gallbladder, or gallstones. Will treat symptomatically for now until further diagnostics done. - promethazine (PHENERGAN) 12.5 MG tablet; Take 1 tablet (12.5 mg total) by mouth every 8 (eight) hours as needed for nausea or vomiting.  Dispense: 20 tablet; Refill: 0  2. Gastroesophageal reflux disease without esophagitis Patient experiences nausea and belching with some reflux after coffee and soda in particular. Will start omeprazole for reflux and obtain abdominal US to look for gallbladder dyskinesis or stones. - omeprazole (PRILOSEC) 40 MG capsule; Take 1 capsule (40 mg total) by mouth daily.  Dispense: 30 capsule; Refill: 3 - US Abdomen Complete; Future  3. End stage renal disease ([place]) Followed by nephrology. Continues to go to dialysis 3 days per week.  4. Essential hypertension Continue current medications.  Patient will follow up in 6 weeks.  Meds ordered this encounter  Medications  . omeprazole (PRILOSEC) 40 MG capsule    Sig: Take 1 capsule (40 mg total) by mouth daily.    Dispense:  30 capsule    Refill:  3    Follow-up: Return in about 6 weeks (around 01/27/2021) for with Anjel Pardo.    Clayborn Bigness, MD

## 2020-12-17 ENCOUNTER — Ambulatory Visit: Payer: Medicare Other | Admitting: Hospice and Palliative Medicine

## 2020-12-17 DIAGNOSIS — N186 End stage renal disease: Secondary | ICD-10-CM | POA: Diagnosis not present

## 2020-12-17 DIAGNOSIS — D689 Coagulation defect, unspecified: Secondary | ICD-10-CM | POA: Diagnosis not present

## 2020-12-17 DIAGNOSIS — Z992 Dependence on renal dialysis: Secondary | ICD-10-CM | POA: Diagnosis not present

## 2020-12-17 DIAGNOSIS — D631 Anemia in chronic kidney disease: Secondary | ICD-10-CM | POA: Diagnosis not present

## 2020-12-17 DIAGNOSIS — N2581 Secondary hyperparathyroidism of renal origin: Secondary | ICD-10-CM | POA: Diagnosis not present

## 2020-12-17 DIAGNOSIS — D509 Iron deficiency anemia, unspecified: Secondary | ICD-10-CM | POA: Diagnosis not present

## 2020-12-22 DIAGNOSIS — N186 End stage renal disease: Secondary | ICD-10-CM | POA: Diagnosis not present

## 2020-12-22 DIAGNOSIS — D631 Anemia in chronic kidney disease: Secondary | ICD-10-CM | POA: Diagnosis not present

## 2020-12-22 DIAGNOSIS — D509 Iron deficiency anemia, unspecified: Secondary | ICD-10-CM | POA: Diagnosis not present

## 2020-12-22 DIAGNOSIS — N2581 Secondary hyperparathyroidism of renal origin: Secondary | ICD-10-CM | POA: Diagnosis not present

## 2020-12-22 DIAGNOSIS — Z992 Dependence on renal dialysis: Secondary | ICD-10-CM | POA: Diagnosis not present

## 2020-12-22 DIAGNOSIS — D689 Coagulation defect, unspecified: Secondary | ICD-10-CM | POA: Diagnosis not present

## 2020-12-24 DIAGNOSIS — D509 Iron deficiency anemia, unspecified: Secondary | ICD-10-CM | POA: Diagnosis not present

## 2020-12-24 DIAGNOSIS — N186 End stage renal disease: Secondary | ICD-10-CM | POA: Diagnosis not present

## 2020-12-24 DIAGNOSIS — N2581 Secondary hyperparathyroidism of renal origin: Secondary | ICD-10-CM | POA: Diagnosis not present

## 2020-12-24 DIAGNOSIS — D631 Anemia in chronic kidney disease: Secondary | ICD-10-CM | POA: Diagnosis not present

## 2020-12-24 DIAGNOSIS — D689 Coagulation defect, unspecified: Secondary | ICD-10-CM | POA: Diagnosis not present

## 2020-12-24 DIAGNOSIS — Z992 Dependence on renal dialysis: Secondary | ICD-10-CM | POA: Diagnosis not present

## 2020-12-27 DIAGNOSIS — Z992 Dependence on renal dialysis: Secondary | ICD-10-CM | POA: Diagnosis not present

## 2020-12-27 DIAGNOSIS — N186 End stage renal disease: Secondary | ICD-10-CM | POA: Diagnosis not present

## 2020-12-27 DIAGNOSIS — D631 Anemia in chronic kidney disease: Secondary | ICD-10-CM | POA: Diagnosis not present

## 2020-12-27 DIAGNOSIS — N2581 Secondary hyperparathyroidism of renal origin: Secondary | ICD-10-CM | POA: Diagnosis not present

## 2020-12-27 DIAGNOSIS — D509 Iron deficiency anemia, unspecified: Secondary | ICD-10-CM | POA: Diagnosis not present

## 2020-12-27 DIAGNOSIS — D689 Coagulation defect, unspecified: Secondary | ICD-10-CM | POA: Diagnosis not present

## 2020-12-28 ENCOUNTER — Other Ambulatory Visit: Payer: Self-pay

## 2020-12-28 DIAGNOSIS — I6522 Occlusion and stenosis of left carotid artery: Secondary | ICD-10-CM

## 2020-12-28 MED ORDER — ROSUVASTATIN CALCIUM 5 MG PO TABS: 5.0000 mg | ORAL_TABLET | Freq: Every day | ORAL | 3 refills | Status: DC

## 2020-12-29 ENCOUNTER — Other Ambulatory Visit: Payer: Medicare Other

## 2020-12-29 DIAGNOSIS — D509 Iron deficiency anemia, unspecified: Secondary | ICD-10-CM | POA: Diagnosis not present

## 2020-12-29 DIAGNOSIS — N2581 Secondary hyperparathyroidism of renal origin: Secondary | ICD-10-CM | POA: Diagnosis not present

## 2020-12-29 DIAGNOSIS — D631 Anemia in chronic kidney disease: Secondary | ICD-10-CM | POA: Diagnosis not present

## 2020-12-29 DIAGNOSIS — N186 End stage renal disease: Secondary | ICD-10-CM | POA: Diagnosis not present

## 2020-12-29 DIAGNOSIS — Z992 Dependence on renal dialysis: Secondary | ICD-10-CM | POA: Diagnosis not present

## 2020-12-29 DIAGNOSIS — D689 Coagulation defect, unspecified: Secondary | ICD-10-CM | POA: Diagnosis not present

## 2020-12-31 DIAGNOSIS — N186 End stage renal disease: Secondary | ICD-10-CM | POA: Diagnosis not present

## 2020-12-31 DIAGNOSIS — D631 Anemia in chronic kidney disease: Secondary | ICD-10-CM | POA: Diagnosis not present

## 2020-12-31 DIAGNOSIS — D509 Iron deficiency anemia, unspecified: Secondary | ICD-10-CM | POA: Diagnosis not present

## 2020-12-31 DIAGNOSIS — N2581 Secondary hyperparathyroidism of renal origin: Secondary | ICD-10-CM | POA: Diagnosis not present

## 2020-12-31 DIAGNOSIS — D689 Coagulation defect, unspecified: Secondary | ICD-10-CM | POA: Diagnosis not present

## 2020-12-31 DIAGNOSIS — Z992 Dependence on renal dialysis: Secondary | ICD-10-CM | POA: Diagnosis not present

## 2021-01-03 DIAGNOSIS — N2581 Secondary hyperparathyroidism of renal origin: Secondary | ICD-10-CM | POA: Diagnosis not present

## 2021-01-03 DIAGNOSIS — Z992 Dependence on renal dialysis: Secondary | ICD-10-CM | POA: Diagnosis not present

## 2021-01-03 DIAGNOSIS — N186 End stage renal disease: Secondary | ICD-10-CM | POA: Diagnosis not present

## 2021-01-03 DIAGNOSIS — D689 Coagulation defect, unspecified: Secondary | ICD-10-CM | POA: Diagnosis not present

## 2021-01-03 DIAGNOSIS — D509 Iron deficiency anemia, unspecified: Secondary | ICD-10-CM | POA: Diagnosis not present

## 2021-01-03 DIAGNOSIS — N040 Nephrotic syndrome with minor glomerular abnormality: Secondary | ICD-10-CM | POA: Diagnosis not present

## 2021-01-03 DIAGNOSIS — D631 Anemia in chronic kidney disease: Secondary | ICD-10-CM | POA: Diagnosis not present

## 2021-01-05 DIAGNOSIS — D509 Iron deficiency anemia, unspecified: Secondary | ICD-10-CM | POA: Diagnosis not present

## 2021-01-05 DIAGNOSIS — D689 Coagulation defect, unspecified: Secondary | ICD-10-CM | POA: Diagnosis not present

## 2021-01-05 DIAGNOSIS — N186 End stage renal disease: Secondary | ICD-10-CM | POA: Diagnosis not present

## 2021-01-05 DIAGNOSIS — D631 Anemia in chronic kidney disease: Secondary | ICD-10-CM | POA: Diagnosis not present

## 2021-01-05 DIAGNOSIS — N2581 Secondary hyperparathyroidism of renal origin: Secondary | ICD-10-CM | POA: Diagnosis not present

## 2021-01-05 DIAGNOSIS — R52 Pain, unspecified: Secondary | ICD-10-CM | POA: Diagnosis not present

## 2021-01-05 DIAGNOSIS — Z992 Dependence on renal dialysis: Secondary | ICD-10-CM | POA: Diagnosis not present

## 2021-01-10 DIAGNOSIS — D631 Anemia in chronic kidney disease: Secondary | ICD-10-CM | POA: Diagnosis not present

## 2021-01-10 DIAGNOSIS — N2581 Secondary hyperparathyroidism of renal origin: Secondary | ICD-10-CM | POA: Diagnosis not present

## 2021-01-10 DIAGNOSIS — R52 Pain, unspecified: Secondary | ICD-10-CM | POA: Diagnosis not present

## 2021-01-10 DIAGNOSIS — D689 Coagulation defect, unspecified: Secondary | ICD-10-CM | POA: Diagnosis not present

## 2021-01-10 DIAGNOSIS — Z992 Dependence on renal dialysis: Secondary | ICD-10-CM | POA: Diagnosis not present

## 2021-01-10 DIAGNOSIS — N186 End stage renal disease: Secondary | ICD-10-CM | POA: Diagnosis not present

## 2021-01-10 DIAGNOSIS — D509 Iron deficiency anemia, unspecified: Secondary | ICD-10-CM | POA: Diagnosis not present

## 2021-01-12 DIAGNOSIS — D631 Anemia in chronic kidney disease: Secondary | ICD-10-CM | POA: Diagnosis not present

## 2021-01-12 DIAGNOSIS — R52 Pain, unspecified: Secondary | ICD-10-CM | POA: Diagnosis not present

## 2021-01-12 DIAGNOSIS — D509 Iron deficiency anemia, unspecified: Secondary | ICD-10-CM | POA: Diagnosis not present

## 2021-01-12 DIAGNOSIS — Z992 Dependence on renal dialysis: Secondary | ICD-10-CM | POA: Diagnosis not present

## 2021-01-12 DIAGNOSIS — D689 Coagulation defect, unspecified: Secondary | ICD-10-CM | POA: Diagnosis not present

## 2021-01-12 DIAGNOSIS — N186 End stage renal disease: Secondary | ICD-10-CM | POA: Diagnosis not present

## 2021-01-12 DIAGNOSIS — N2581 Secondary hyperparathyroidism of renal origin: Secondary | ICD-10-CM | POA: Diagnosis not present

## 2021-01-14 DIAGNOSIS — N186 End stage renal disease: Secondary | ICD-10-CM | POA: Diagnosis not present

## 2021-01-14 DIAGNOSIS — D689 Coagulation defect, unspecified: Secondary | ICD-10-CM | POA: Diagnosis not present

## 2021-01-14 DIAGNOSIS — D509 Iron deficiency anemia, unspecified: Secondary | ICD-10-CM | POA: Diagnosis not present

## 2021-01-14 DIAGNOSIS — D631 Anemia in chronic kidney disease: Secondary | ICD-10-CM | POA: Diagnosis not present

## 2021-01-14 DIAGNOSIS — R52 Pain, unspecified: Secondary | ICD-10-CM | POA: Diagnosis not present

## 2021-01-14 DIAGNOSIS — Z992 Dependence on renal dialysis: Secondary | ICD-10-CM | POA: Diagnosis not present

## 2021-01-14 DIAGNOSIS — N2581 Secondary hyperparathyroidism of renal origin: Secondary | ICD-10-CM | POA: Diagnosis not present

## 2021-01-17 DIAGNOSIS — N186 End stage renal disease: Secondary | ICD-10-CM | POA: Diagnosis not present

## 2021-01-17 DIAGNOSIS — D631 Anemia in chronic kidney disease: Secondary | ICD-10-CM | POA: Diagnosis not present

## 2021-01-17 DIAGNOSIS — Z992 Dependence on renal dialysis: Secondary | ICD-10-CM | POA: Diagnosis not present

## 2021-01-17 DIAGNOSIS — D689 Coagulation defect, unspecified: Secondary | ICD-10-CM | POA: Diagnosis not present

## 2021-01-17 DIAGNOSIS — N2581 Secondary hyperparathyroidism of renal origin: Secondary | ICD-10-CM | POA: Diagnosis not present

## 2021-01-17 DIAGNOSIS — R52 Pain, unspecified: Secondary | ICD-10-CM | POA: Diagnosis not present

## 2021-01-17 DIAGNOSIS — D509 Iron deficiency anemia, unspecified: Secondary | ICD-10-CM | POA: Diagnosis not present

## 2021-01-18 ENCOUNTER — Ambulatory Visit: Payer: Medicare Other | Admitting: Hospice and Palliative Medicine

## 2021-01-19 DIAGNOSIS — Z992 Dependence on renal dialysis: Secondary | ICD-10-CM | POA: Diagnosis not present

## 2021-01-19 DIAGNOSIS — D689 Coagulation defect, unspecified: Secondary | ICD-10-CM | POA: Diagnosis not present

## 2021-01-19 DIAGNOSIS — N2581 Secondary hyperparathyroidism of renal origin: Secondary | ICD-10-CM | POA: Diagnosis not present

## 2021-01-19 DIAGNOSIS — R52 Pain, unspecified: Secondary | ICD-10-CM | POA: Diagnosis not present

## 2021-01-19 DIAGNOSIS — D509 Iron deficiency anemia, unspecified: Secondary | ICD-10-CM | POA: Diagnosis not present

## 2021-01-19 DIAGNOSIS — D631 Anemia in chronic kidney disease: Secondary | ICD-10-CM | POA: Diagnosis not present

## 2021-01-19 DIAGNOSIS — N186 End stage renal disease: Secondary | ICD-10-CM | POA: Diagnosis not present

## 2021-01-21 DIAGNOSIS — D689 Coagulation defect, unspecified: Secondary | ICD-10-CM | POA: Diagnosis not present

## 2021-01-21 DIAGNOSIS — R52 Pain, unspecified: Secondary | ICD-10-CM | POA: Diagnosis not present

## 2021-01-21 DIAGNOSIS — N2581 Secondary hyperparathyroidism of renal origin: Secondary | ICD-10-CM | POA: Diagnosis not present

## 2021-01-21 DIAGNOSIS — Z992 Dependence on renal dialysis: Secondary | ICD-10-CM | POA: Diagnosis not present

## 2021-01-21 DIAGNOSIS — D509 Iron deficiency anemia, unspecified: Secondary | ICD-10-CM | POA: Diagnosis not present

## 2021-01-21 DIAGNOSIS — N186 End stage renal disease: Secondary | ICD-10-CM | POA: Diagnosis not present

## 2021-01-21 DIAGNOSIS — D631 Anemia in chronic kidney disease: Secondary | ICD-10-CM | POA: Diagnosis not present

## 2021-01-24 ENCOUNTER — Other Ambulatory Visit: Payer: Self-pay

## 2021-01-24 ENCOUNTER — Ambulatory Visit (INDEPENDENT_AMBULATORY_CARE_PROVIDER_SITE_OTHER): Payer: Medicare Other | Admitting: Physician Assistant

## 2021-01-24 DIAGNOSIS — N2581 Secondary hyperparathyroidism of renal origin: Secondary | ICD-10-CM | POA: Diagnosis not present

## 2021-01-24 DIAGNOSIS — R300 Dysuria: Secondary | ICD-10-CM

## 2021-01-24 DIAGNOSIS — R110 Nausea: Secondary | ICD-10-CM | POA: Diagnosis not present

## 2021-01-24 DIAGNOSIS — K219 Gastro-esophageal reflux disease without esophagitis: Secondary | ICD-10-CM | POA: Diagnosis not present

## 2021-01-24 DIAGNOSIS — I700 Atherosclerosis of aorta: Secondary | ICD-10-CM

## 2021-01-24 DIAGNOSIS — D509 Iron deficiency anemia, unspecified: Secondary | ICD-10-CM | POA: Diagnosis not present

## 2021-01-24 DIAGNOSIS — I6523 Occlusion and stenosis of bilateral carotid arteries: Secondary | ICD-10-CM

## 2021-01-24 DIAGNOSIS — N186 End stage renal disease: Secondary | ICD-10-CM

## 2021-01-24 DIAGNOSIS — E2839 Other primary ovarian failure: Secondary | ICD-10-CM

## 2021-01-24 DIAGNOSIS — D472 Monoclonal gammopathy: Secondary | ICD-10-CM

## 2021-01-24 DIAGNOSIS — E042 Nontoxic multinodular goiter: Secondary | ICD-10-CM | POA: Diagnosis not present

## 2021-01-24 DIAGNOSIS — I10 Essential (primary) hypertension: Secondary | ICD-10-CM

## 2021-01-24 DIAGNOSIS — Z0001 Encounter for general adult medical examination with abnormal findings: Secondary | ICD-10-CM

## 2021-01-24 DIAGNOSIS — R52 Pain, unspecified: Secondary | ICD-10-CM | POA: Diagnosis not present

## 2021-01-24 DIAGNOSIS — Z124 Encounter for screening for malignant neoplasm of cervix: Secondary | ICD-10-CM

## 2021-01-24 DIAGNOSIS — D689 Coagulation defect, unspecified: Secondary | ICD-10-CM | POA: Diagnosis not present

## 2021-01-24 DIAGNOSIS — D631 Anemia in chronic kidney disease: Secondary | ICD-10-CM | POA: Diagnosis not present

## 2021-01-24 DIAGNOSIS — Z992 Dependence on renal dialysis: Secondary | ICD-10-CM | POA: Diagnosis not present

## 2021-01-24 NOTE — Progress Notes (Signed)
[facility] [place], [place] [address]  Internal MEDICINE  Office Visit Note  Patient Name: Kristen Francis  [id number]  [id number]  Date of Service: 01/27/2021  Chief Complaint  Patient presents with  . Medicare Wellness    Review meds  . Anemia  . Hyperlipidemia  . Hypertension     HPI Pt is here for routine health maintenance examination -Had dialysis today so she is a little tired.  -Nausea is getting better, she just isnt hungry on dialysis days. If she did try to eat a lot on those days then nausea would set in. Has not had to take the phenergan. She has been using omeprazole. She has gained 2 pounds since last visit, which is good given she had been losing weight since being on dialysis the past few years. -BP was checked at dialysis and was 150/67. She does not take her BP meds before dialysis, but did take them after she got home around 11am today. She had not been taking both coreg doses, was doing once a day but will do better about taking BID. Did review her medications and she is on torsemide from nephrology, so she will discuss with them regarding whether she should still be taking this. -She is up to date on her mammogram in 08/2020 and colonoscopy done in 2017. She is open to BMD though does not want to rush into scheduling given other upcoming testing. She still needs her abdominal US scheduled and will be monitored for her thryoid [place] in April.  Current Medication: Outpatient Encounter Medications as of 01/24/2021  Medication Sig  . allopurinol (ZYLOPRIM) 100 MG tablet Take 1 tablet (100 mg total) by mouth daily.  Marland Kitchen amLODipine (NORVASC) 10 MG tablet Take by mouth.  . B Complex-C-Folic Acid (RENAL-VITE) 0.8 MG TABS Take by mouth.  . carvedilol (COREG) 25 MG tablet Take 25 mg by mouth 2 (two) times daily with a meal.  . cholecalciferol (VITAMIN D) 1000 units tablet Take 1,000 Units by mouth daily.   . cinacalcet (SENSIPAR) 60 MG tablet  Take by mouth.  . cinacalcet (SENSIPAR) 60 MG tablet Take 60 mg by mouth daily.  . cinacalcet (SENSIPAR) 90 MG tablet Take 90 mg by mouth daily.  . ferrous sulfate 325 (65 FE) MG tablet Take 325 mg by mouth daily.   Marland Kitchen lidocaine-prilocaine (EMLA) cream APPLY SMALL AMOUNT TO ACCESS SITE (AVF) 3 TIMES A WEEK 1 HOUR BEFORE DIALYSIS. COVER WITH OCCLUSIVE DRESSING (SARAN WRAP)  . losartan (COZAAR) 50 MG tablet Take 50 mg by mouth daily.  . Methoxy PEG-Epoetin Beta (MIRCERA IJ) Mircera  . mirtazapine (REMERON) 7.5 MG tablet Take 1 tablet (7.5 mg total) by mouth at bedtime.  Marland Kitchen omeprazole (PRILOSEC) 40 MG capsule Take 1 capsule (40 mg total) by mouth daily.  . promethazine (PHENERGAN) 12.5 MG tablet Take 1 tablet (12.5 mg total) by mouth every 8 (eight) hours as needed for nausea or vomiting.  . rosuvastatin (CRESTOR) 5 MG tablet Take 1 tablet (5 mg total) by mouth daily.  Marland Kitchen torsemide (DEMADEX) 5 MG tablet Take 5 mg by mouth daily.    No facility-administered encounter medications on file as of 01/24/2021.    Surgical History: Past Surgical History:  Procedure Laterality Date  . A/V FISTULAGRAM Left 12/25/2018   Procedure: A/V FISTULAGRAM;  Surgeon: Katha Cabal, MD;  Location: [place] [facility];  Service: Cardiovascular;  Laterality: Left;  . ABDOMINAL HYSTERECTOMY    . APPENDECTOMY    .  AV FISTULA PLACEMENT Left 07/12/2018   Procedure: INSERTION OF ARTERIOVENOUS (AV) GORE-TEX GRAFT ARM ( BRACHIAL AXILLARY );  Surgeon: Katha Cabal, MD;  Location: [facility] ORS;  Service: Vascular;  Laterality: Left;  . COLONOSCOPY WITH PROPOFOL N/A 01/14/2016   Procedure: COLONOSCOPY WITH PROPOFOL;  Surgeon: Manya Silvas, MD;  Location: [facility] ENDOSCOPY;  Service: Endoscopy;  Laterality: N/A;  . TONSILLECTOMY      Medical History: Past Medical History:  Diagnosis Date  . Anemia   . Arthritis   . Chronic kidney disease    ESRD  . Complication of anesthesia    had procedure and felt incision early  80s  . Gout   . Hyperlipemia   . Hypertension   . Macular degeneration, right eye   . Stroke [facility])     Family History: Family History  Problem Relation Age of Onset  . Kidney disease Mother   . Breast cancer Neg Hx       Review of Systems  Constitutional: Positive for appetite change, fatigue and unexpected weight change. Negative for chills.  HENT: Negative for congestion, postnasal drip, rhinorrhea, sneezing and sore throat.   Eyes: Negative for redness.  Respiratory: Negative for cough, chest tightness and shortness of breath.   Cardiovascular: Negative for chest pain and palpitations.  Gastrointestinal: Positive for constipation and nausea. Negative for abdominal pain, diarrhea and vomiting.  Genitourinary: Negative for dysuria and frequency.  Musculoskeletal: Negative for arthralgias, back pain, joint swelling and neck pain.  Skin: Negative for rash.  Neurological: Negative.  Negative for tremors and numbness.  Hematological: Negative for adenopathy. Does not bruise/bleed easily.  Psychiatric/Behavioral: Negative for behavioral problems (Depression), sleep disturbance and suicidal ideas. The patient is not nervous/anxious.      Vital Signs: BP (!) 152/56   Pulse 76   Temp (!) 97.4 F (36.3 C)   Resp 16   Ht 5\' 6"  (1.676 m)   Wt 120 lb 3.2 oz (54.5 kg)   SpO2 99%   BMI 19.40 kg/m    Physical Exam Constitutional:      General: She is not in acute distress.    Appearance: She is well-developed and normal weight. She is not diaphoretic.  HENT:     Head: Normocephalic and atraumatic.     Right Ear: External ear normal.     Left Ear: External ear normal.     Nose: Nose normal.     Mouth/Throat:     Pharynx: No oropharyngeal exudate.  Eyes:     General: No scleral icterus.       Right eye: No discharge.        Left eye: No discharge.     Conjunctiva/sclera: Conjunctivae normal.     Pupils: Pupils are equal, round, and reactive to light.  Neck:      Thyroid: No thyromegaly.     Vascular: No JVD.     Trachea: No tracheal deviation.  Cardiovascular:     Rate and Rhythm: Normal rate and regular rhythm.     Heart sounds: Normal heart sounds. No murmur heard. No friction rub. No gallop.   Pulmonary:     Effort: Pulmonary effort is normal. No respiratory distress.     Breath sounds: Normal breath sounds. No stridor. No wheezing or rales.  Chest:     Chest wall: No tenderness.  Breasts:     Right: Normal. No mass.     Left: Normal. No mass.    Abdominal:  General: Bowel sounds are normal. There is no distension.     Palpations: Abdomen is soft. There is no mass.     Tenderness: There is no abdominal tenderness. There is no guarding or rebound.  Musculoskeletal:        General: No tenderness or deformity. Normal range of motion.     Cervical back: Normal range of motion and neck supple.  Lymphadenopathy:     Cervical: No cervical adenopathy.  Skin:    General: Skin is warm and dry.     Coloration: Skin is not pale.     Findings: No erythema or rash.  Neurological:     Mental Status: She is alert.     Cranial Nerves: No cranial nerve deficit.     Motor: No abnormal muscle tone.     Coordination: Coordination normal.     Deep Tendon Reflexes: Reflexes are normal and symmetric.  Psychiatric:        Behavior: Behavior normal.        Thought Content: Thought content normal.        Judgment: Judgment normal.      LABS: No results found for this or any previous visit (from the past 2160 hour(s)).      Assessment/Plan: 1. Encounter for general adult medical examination with abnormal findings Up to date on mammogram and colonoscopy. Will schedule BMD. Had labs done not too long ago.  2. Essential hypertension Elevated in office again, but fluctuates with dialysis. She also has not been taking her coreg as prescribed and is going to start taking it regularly and monitor BP closely. She will also f/u with nephrology  regarding current diuretics.  3. Multinodular goiter Continue to monitor, repeat US in April.  4. Nausea Will be scheduled for US abdomen to look into possible causes such a gallbladder dyskinesis. May take phenergan as needed.  5. Gastroesophageal reflux disease without esophagitis Continue omeprazole.  6. End stage renal disease ([place]) On dialysis, followed by nephrology.  7. Bilateral carotid stenosis Continue crestor. Will repeat carotid US in Oct.  8. Monoclonal gammopathy of unknown significance ([facility]) Followed by heme/onc who also believe elevated ferritin due to iron infusion/reactive; evaluated for hemochromatosis which was negative.  9. Ovarian failure - DG Bone Density; Future  10. Dysuria - UA/M w/rflx Culture, Routine    General Counseling: Samaira verbalizes understanding of the findings of todays visit and agrees with plan of treatment. I have discussed any further diagnostic evaluation that may be needed or ordered today. We also reviewed her medications today. she has been encouraged to call the office with any questions or concerns that should arise related to todays visit.    Counseling:    Orders Placed This Encounter  Procedures  . DG Bone Density  . UA/M w/rflx Culture, Routine    No orders of the defined types were placed in this encounter.   Total time spent:30 Minutes  Time spent includes review of chart, medications, test results, and follow up plan with the patient.     Lavera Guise, MD  Internal Medicine

## 2021-01-26 DIAGNOSIS — Z992 Dependence on renal dialysis: Secondary | ICD-10-CM | POA: Diagnosis not present

## 2021-01-26 DIAGNOSIS — N186 End stage renal disease: Secondary | ICD-10-CM | POA: Diagnosis not present

## 2021-01-26 DIAGNOSIS — N2581 Secondary hyperparathyroidism of renal origin: Secondary | ICD-10-CM | POA: Diagnosis not present

## 2021-01-26 DIAGNOSIS — D631 Anemia in chronic kidney disease: Secondary | ICD-10-CM | POA: Diagnosis not present

## 2021-01-26 DIAGNOSIS — R52 Pain, unspecified: Secondary | ICD-10-CM | POA: Diagnosis not present

## 2021-01-26 DIAGNOSIS — D689 Coagulation defect, unspecified: Secondary | ICD-10-CM | POA: Diagnosis not present

## 2021-01-26 DIAGNOSIS — D509 Iron deficiency anemia, unspecified: Secondary | ICD-10-CM | POA: Diagnosis not present

## 2021-01-27 ENCOUNTER — Ambulatory Visit: Payer: Medicare Other | Admitting: Physician Assistant

## 2021-01-27 LAB — UA/M W/RFLX CULTURE, ROUTINE

## 2021-01-28 DIAGNOSIS — N186 End stage renal disease: Secondary | ICD-10-CM | POA: Diagnosis not present

## 2021-01-28 DIAGNOSIS — D631 Anemia in chronic kidney disease: Secondary | ICD-10-CM | POA: Diagnosis not present

## 2021-01-28 DIAGNOSIS — D509 Iron deficiency anemia, unspecified: Secondary | ICD-10-CM | POA: Diagnosis not present

## 2021-01-28 DIAGNOSIS — N2581 Secondary hyperparathyroidism of renal origin: Secondary | ICD-10-CM | POA: Diagnosis not present

## 2021-01-28 DIAGNOSIS — Z992 Dependence on renal dialysis: Secondary | ICD-10-CM | POA: Diagnosis not present

## 2021-01-28 DIAGNOSIS — D689 Coagulation defect, unspecified: Secondary | ICD-10-CM | POA: Diagnosis not present

## 2021-01-28 DIAGNOSIS — R52 Pain, unspecified: Secondary | ICD-10-CM | POA: Diagnosis not present

## 2021-01-31 ENCOUNTER — Other Ambulatory Visit: Payer: Self-pay | Admitting: Nurse Practitioner

## 2021-01-31 DIAGNOSIS — N040 Nephrotic syndrome with minor glomerular abnormality: Secondary | ICD-10-CM | POA: Diagnosis not present

## 2021-01-31 DIAGNOSIS — D689 Coagulation defect, unspecified: Secondary | ICD-10-CM | POA: Diagnosis not present

## 2021-01-31 DIAGNOSIS — Z992 Dependence on renal dialysis: Secondary | ICD-10-CM | POA: Diagnosis not present

## 2021-01-31 DIAGNOSIS — N2581 Secondary hyperparathyroidism of renal origin: Secondary | ICD-10-CM | POA: Diagnosis not present

## 2021-01-31 DIAGNOSIS — N186 End stage renal disease: Secondary | ICD-10-CM | POA: Diagnosis not present

## 2021-01-31 DIAGNOSIS — D509 Iron deficiency anemia, unspecified: Secondary | ICD-10-CM | POA: Diagnosis not present

## 2021-01-31 DIAGNOSIS — R52 Pain, unspecified: Secondary | ICD-10-CM | POA: Diagnosis not present

## 2021-01-31 DIAGNOSIS — D631 Anemia in chronic kidney disease: Secondary | ICD-10-CM | POA: Diagnosis not present

## 2021-02-02 DIAGNOSIS — N2581 Secondary hyperparathyroidism of renal origin: Secondary | ICD-10-CM | POA: Diagnosis not present

## 2021-02-02 DIAGNOSIS — D689 Coagulation defect, unspecified: Secondary | ICD-10-CM | POA: Diagnosis not present

## 2021-02-02 DIAGNOSIS — N186 End stage renal disease: Secondary | ICD-10-CM | POA: Diagnosis not present

## 2021-02-02 DIAGNOSIS — D631 Anemia in chronic kidney disease: Secondary | ICD-10-CM | POA: Diagnosis not present

## 2021-02-02 DIAGNOSIS — Z992 Dependence on renal dialysis: Secondary | ICD-10-CM | POA: Diagnosis not present

## 2021-02-04 DIAGNOSIS — N186 End stage renal disease: Secondary | ICD-10-CM | POA: Diagnosis not present

## 2021-02-04 DIAGNOSIS — N2581 Secondary hyperparathyroidism of renal origin: Secondary | ICD-10-CM | POA: Diagnosis not present

## 2021-02-04 DIAGNOSIS — Z992 Dependence on renal dialysis: Secondary | ICD-10-CM | POA: Diagnosis not present

## 2021-02-04 DIAGNOSIS — D631 Anemia in chronic kidney disease: Secondary | ICD-10-CM | POA: Diagnosis not present

## 2021-02-04 DIAGNOSIS — D689 Coagulation defect, unspecified: Secondary | ICD-10-CM | POA: Diagnosis not present

## 2021-02-07 DIAGNOSIS — Z992 Dependence on renal dialysis: Secondary | ICD-10-CM | POA: Diagnosis not present

## 2021-02-07 DIAGNOSIS — D689 Coagulation defect, unspecified: Secondary | ICD-10-CM | POA: Diagnosis not present

## 2021-02-07 DIAGNOSIS — N2581 Secondary hyperparathyroidism of renal origin: Secondary | ICD-10-CM | POA: Diagnosis not present

## 2021-02-07 DIAGNOSIS — N186 End stage renal disease: Secondary | ICD-10-CM | POA: Diagnosis not present

## 2021-02-07 DIAGNOSIS — D509 Iron deficiency anemia, unspecified: Secondary | ICD-10-CM | POA: Diagnosis not present

## 2021-02-09 DIAGNOSIS — Z992 Dependence on renal dialysis: Secondary | ICD-10-CM | POA: Diagnosis not present

## 2021-02-09 DIAGNOSIS — N2581 Secondary hyperparathyroidism of renal origin: Secondary | ICD-10-CM | POA: Diagnosis not present

## 2021-02-09 DIAGNOSIS — D689 Coagulation defect, unspecified: Secondary | ICD-10-CM | POA: Diagnosis not present

## 2021-02-09 DIAGNOSIS — D509 Iron deficiency anemia, unspecified: Secondary | ICD-10-CM | POA: Diagnosis not present

## 2021-02-09 DIAGNOSIS — N186 End stage renal disease: Secondary | ICD-10-CM | POA: Diagnosis not present

## 2021-02-14 DIAGNOSIS — Z992 Dependence on renal dialysis: Secondary | ICD-10-CM | POA: Diagnosis not present

## 2021-02-14 DIAGNOSIS — N2581 Secondary hyperparathyroidism of renal origin: Secondary | ICD-10-CM | POA: Diagnosis not present

## 2021-02-14 DIAGNOSIS — D689 Coagulation defect, unspecified: Secondary | ICD-10-CM | POA: Diagnosis not present

## 2021-02-14 DIAGNOSIS — N186 End stage renal disease: Secondary | ICD-10-CM | POA: Diagnosis not present

## 2021-02-16 DIAGNOSIS — N186 End stage renal disease: Secondary | ICD-10-CM | POA: Diagnosis not present

## 2021-02-16 DIAGNOSIS — D689 Coagulation defect, unspecified: Secondary | ICD-10-CM | POA: Diagnosis not present

## 2021-02-16 DIAGNOSIS — Z992 Dependence on renal dialysis: Secondary | ICD-10-CM | POA: Diagnosis not present

## 2021-02-16 DIAGNOSIS — N2581 Secondary hyperparathyroidism of renal origin: Secondary | ICD-10-CM | POA: Diagnosis not present

## 2021-02-17 ENCOUNTER — Encounter (INDEPENDENT_AMBULATORY_CARE_PROVIDER_SITE_OTHER): Payer: Medicare Other

## 2021-02-17 ENCOUNTER — Ambulatory Visit (INDEPENDENT_AMBULATORY_CARE_PROVIDER_SITE_OTHER): Payer: Medicare Other | Admitting: Vascular Surgery

## 2021-02-18 DIAGNOSIS — Z992 Dependence on renal dialysis: Secondary | ICD-10-CM | POA: Diagnosis not present

## 2021-02-18 DIAGNOSIS — D689 Coagulation defect, unspecified: Secondary | ICD-10-CM | POA: Diagnosis not present

## 2021-02-18 DIAGNOSIS — N2581 Secondary hyperparathyroidism of renal origin: Secondary | ICD-10-CM | POA: Diagnosis not present

## 2021-02-18 DIAGNOSIS — N186 End stage renal disease: Secondary | ICD-10-CM | POA: Diagnosis not present

## 2021-02-21 DIAGNOSIS — D689 Coagulation defect, unspecified: Secondary | ICD-10-CM | POA: Diagnosis not present

## 2021-02-21 DIAGNOSIS — Z992 Dependence on renal dialysis: Secondary | ICD-10-CM | POA: Diagnosis not present

## 2021-02-21 DIAGNOSIS — N2581 Secondary hyperparathyroidism of renal origin: Secondary | ICD-10-CM | POA: Diagnosis not present

## 2021-02-21 DIAGNOSIS — N186 End stage renal disease: Secondary | ICD-10-CM | POA: Diagnosis not present

## 2021-02-23 DIAGNOSIS — N2581 Secondary hyperparathyroidism of renal origin: Secondary | ICD-10-CM | POA: Diagnosis not present

## 2021-02-23 DIAGNOSIS — D689 Coagulation defect, unspecified: Secondary | ICD-10-CM | POA: Diagnosis not present

## 2021-02-23 DIAGNOSIS — N186 End stage renal disease: Secondary | ICD-10-CM | POA: Diagnosis not present

## 2021-02-23 DIAGNOSIS — Z992 Dependence on renal dialysis: Secondary | ICD-10-CM | POA: Diagnosis not present

## 2021-02-25 DIAGNOSIS — Z992 Dependence on renal dialysis: Secondary | ICD-10-CM | POA: Diagnosis not present

## 2021-02-25 DIAGNOSIS — N2581 Secondary hyperparathyroidism of renal origin: Secondary | ICD-10-CM | POA: Diagnosis not present

## 2021-02-25 DIAGNOSIS — D689 Coagulation defect, unspecified: Secondary | ICD-10-CM | POA: Diagnosis not present

## 2021-02-25 DIAGNOSIS — N186 End stage renal disease: Secondary | ICD-10-CM | POA: Diagnosis not present

## 2021-02-28 DIAGNOSIS — N186 End stage renal disease: Secondary | ICD-10-CM | POA: Diagnosis not present

## 2021-02-28 DIAGNOSIS — N2581 Secondary hyperparathyroidism of renal origin: Secondary | ICD-10-CM | POA: Diagnosis not present

## 2021-02-28 DIAGNOSIS — D689 Coagulation defect, unspecified: Secondary | ICD-10-CM | POA: Diagnosis not present

## 2021-02-28 DIAGNOSIS — Z992 Dependence on renal dialysis: Secondary | ICD-10-CM | POA: Diagnosis not present

## 2021-03-02 DIAGNOSIS — N2581 Secondary hyperparathyroidism of renal origin: Secondary | ICD-10-CM | POA: Diagnosis not present

## 2021-03-02 DIAGNOSIS — D689 Coagulation defect, unspecified: Secondary | ICD-10-CM | POA: Diagnosis not present

## 2021-03-02 DIAGNOSIS — N186 End stage renal disease: Secondary | ICD-10-CM | POA: Diagnosis not present

## 2021-03-02 DIAGNOSIS — Z992 Dependence on renal dialysis: Secondary | ICD-10-CM | POA: Diagnosis not present

## 2021-03-03 DIAGNOSIS — N186 End stage renal disease: Secondary | ICD-10-CM | POA: Diagnosis not present

## 2021-03-03 DIAGNOSIS — N040 Nephrotic syndrome with minor glomerular abnormality: Secondary | ICD-10-CM | POA: Diagnosis not present

## 2021-03-03 DIAGNOSIS — Z992 Dependence on renal dialysis: Secondary | ICD-10-CM | POA: Diagnosis not present

## 2021-03-04 DIAGNOSIS — N2581 Secondary hyperparathyroidism of renal origin: Secondary | ICD-10-CM | POA: Diagnosis not present

## 2021-03-04 DIAGNOSIS — Z992 Dependence on renal dialysis: Secondary | ICD-10-CM | POA: Diagnosis not present

## 2021-03-04 DIAGNOSIS — D689 Coagulation defect, unspecified: Secondary | ICD-10-CM | POA: Diagnosis not present

## 2021-03-04 DIAGNOSIS — N186 End stage renal disease: Secondary | ICD-10-CM | POA: Diagnosis not present

## 2021-03-07 ENCOUNTER — Ambulatory Visit (INDEPENDENT_AMBULATORY_CARE_PROVIDER_SITE_OTHER): Payer: Medicare Other | Admitting: Vascular Surgery

## 2021-03-07 ENCOUNTER — Encounter (INDEPENDENT_AMBULATORY_CARE_PROVIDER_SITE_OTHER): Payer: Medicare Other

## 2021-03-07 DIAGNOSIS — N186 End stage renal disease: Secondary | ICD-10-CM | POA: Diagnosis not present

## 2021-03-07 DIAGNOSIS — D689 Coagulation defect, unspecified: Secondary | ICD-10-CM | POA: Diagnosis not present

## 2021-03-07 DIAGNOSIS — N2581 Secondary hyperparathyroidism of renal origin: Secondary | ICD-10-CM | POA: Diagnosis not present

## 2021-03-07 DIAGNOSIS — Z992 Dependence on renal dialysis: Secondary | ICD-10-CM | POA: Diagnosis not present

## 2021-03-08 DIAGNOSIS — N2581 Secondary hyperparathyroidism of renal origin: Secondary | ICD-10-CM | POA: Diagnosis not present

## 2021-03-08 DIAGNOSIS — D689 Coagulation defect, unspecified: Secondary | ICD-10-CM | POA: Diagnosis not present

## 2021-03-08 DIAGNOSIS — N186 End stage renal disease: Secondary | ICD-10-CM | POA: Diagnosis not present

## 2021-03-08 DIAGNOSIS — Z992 Dependence on renal dialysis: Secondary | ICD-10-CM | POA: Diagnosis not present

## 2021-03-09 DIAGNOSIS — N2581 Secondary hyperparathyroidism of renal origin: Secondary | ICD-10-CM | POA: Diagnosis not present

## 2021-03-09 DIAGNOSIS — N186 End stage renal disease: Secondary | ICD-10-CM | POA: Diagnosis not present

## 2021-03-09 DIAGNOSIS — D689 Coagulation defect, unspecified: Secondary | ICD-10-CM | POA: Diagnosis not present

## 2021-03-09 DIAGNOSIS — Z992 Dependence on renal dialysis: Secondary | ICD-10-CM | POA: Diagnosis not present

## 2021-03-11 DIAGNOSIS — N2581 Secondary hyperparathyroidism of renal origin: Secondary | ICD-10-CM | POA: Diagnosis not present

## 2021-03-11 DIAGNOSIS — Z992 Dependence on renal dialysis: Secondary | ICD-10-CM | POA: Diagnosis not present

## 2021-03-11 DIAGNOSIS — N186 End stage renal disease: Secondary | ICD-10-CM | POA: Diagnosis not present

## 2021-03-11 DIAGNOSIS — D689 Coagulation defect, unspecified: Secondary | ICD-10-CM | POA: Diagnosis not present

## 2021-03-14 ENCOUNTER — Other Ambulatory Visit (INDEPENDENT_AMBULATORY_CARE_PROVIDER_SITE_OTHER): Payer: Self-pay | Admitting: Nurse Practitioner

## 2021-03-14 DIAGNOSIS — N186 End stage renal disease: Secondary | ICD-10-CM

## 2021-03-14 DIAGNOSIS — D689 Coagulation defect, unspecified: Secondary | ICD-10-CM | POA: Diagnosis not present

## 2021-03-14 DIAGNOSIS — Z992 Dependence on renal dialysis: Secondary | ICD-10-CM | POA: Diagnosis not present

## 2021-03-14 DIAGNOSIS — N2581 Secondary hyperparathyroidism of renal origin: Secondary | ICD-10-CM | POA: Diagnosis not present

## 2021-03-15 ENCOUNTER — Other Ambulatory Visit: Payer: Self-pay | Admitting: Internal Medicine

## 2021-03-15 DIAGNOSIS — K219 Gastro-esophageal reflux disease without esophagitis: Secondary | ICD-10-CM

## 2021-03-16 DIAGNOSIS — Z992 Dependence on renal dialysis: Secondary | ICD-10-CM | POA: Diagnosis not present

## 2021-03-16 DIAGNOSIS — N186 End stage renal disease: Secondary | ICD-10-CM | POA: Diagnosis not present

## 2021-03-16 DIAGNOSIS — N2581 Secondary hyperparathyroidism of renal origin: Secondary | ICD-10-CM | POA: Diagnosis not present

## 2021-03-16 DIAGNOSIS — D689 Coagulation defect, unspecified: Secondary | ICD-10-CM | POA: Diagnosis not present

## 2021-03-17 ENCOUNTER — Other Ambulatory Visit: Payer: Self-pay

## 2021-03-17 ENCOUNTER — Ambulatory Visit (INDEPENDENT_AMBULATORY_CARE_PROVIDER_SITE_OTHER): Payer: Medicare Other

## 2021-03-17 ENCOUNTER — Ambulatory Visit (INDEPENDENT_AMBULATORY_CARE_PROVIDER_SITE_OTHER): Payer: Medicare Other | Admitting: Vascular Surgery

## 2021-03-17 ENCOUNTER — Encounter (INDEPENDENT_AMBULATORY_CARE_PROVIDER_SITE_OTHER): Payer: Self-pay | Admitting: Vascular Surgery

## 2021-03-17 VITALS — BP 164/70 | HR 72 | Resp 16 | Wt 122.0 lb

## 2021-03-17 DIAGNOSIS — I739 Peripheral vascular disease, unspecified: Secondary | ICD-10-CM | POA: Diagnosis not present

## 2021-03-17 DIAGNOSIS — E1129 Type 2 diabetes mellitus with other diabetic kidney complication: Secondary | ICD-10-CM | POA: Diagnosis not present

## 2021-03-17 DIAGNOSIS — T829XXA Unspecified complication of cardiac and vascular prosthetic device, implant and graft, initial encounter: Secondary | ICD-10-CM | POA: Diagnosis not present

## 2021-03-17 DIAGNOSIS — N186 End stage renal disease: Secondary | ICD-10-CM

## 2021-03-17 DIAGNOSIS — I10 Essential (primary) hypertension: Secondary | ICD-10-CM | POA: Diagnosis not present

## 2021-03-17 DIAGNOSIS — I6522 Occlusion and stenosis of left carotid artery: Secondary | ICD-10-CM | POA: Diagnosis not present

## 2021-03-17 NOTE — Progress Notes (Signed)
MRN : [id number]  Kristen Francis is a 75 y.o. ([birth year]) female who presents with chief complaint of No chief complaint on file. Marland Kitchen  History of Present Illness:   The patient returns to the office for followup of their dialysis access. The function of the access has been stable. The patient denies increased bleeding time or increased recirculation. Patient denies difficulty with cannulation. The patient denies hand pain or other symptoms consistent with steal phenomena.  No significant arm swelling.  The patient denies redness or swelling at the access site. The patient denies fever or chills at home or while on dialysis.  The patient denies amaurosis fugax or recent TIA symptoms. There are no recent neurological changes noted. The patient denies claudication symptoms or rest pain symptoms. The patient denies history of DVT, PE or superficial thrombophlebitis. The patient denies recent episodes of angina or shortness of breath.   Duplex ultrasound of the AV access shows a patent access.  The previously noted stenosis is unchanged compared to last study.    No outpatient medications have been marked as taking for the 03/17/21 encounter (Appointment) with Delana Meyer, Dolores Lory, MD.    Past Medical History:  Diagnosis Date  . Anemia   . Arthritis   . Chronic kidney disease    ESRD  . Complication of anesthesia    had procedure and felt incision early 80s  . Gout   . Hyperlipemia   . Hypertension   . Macular degeneration, right eye   . Stroke [facility])     Past Surgical History:  Procedure Laterality Date  . A/V FISTULAGRAM Left 12/25/2018   Procedure: A/V FISTULAGRAM;  Surgeon: Katha Cabal, MD;  Location: [place] [facility];  Service: Cardiovascular;  Laterality: Left;  . ABDOMINAL HYSTERECTOMY    . APPENDECTOMY    . AV FISTULA PLACEMENT Left 07/12/2018   Procedure: INSERTION OF ARTERIOVENOUS (AV) GORE-TEX GRAFT ARM ( BRACHIAL AXILLARY );  Surgeon: Katha Cabal, MD;  Location: [facility] ORS;  Service: Vascular;  Laterality: Left;  . COLONOSCOPY WITH PROPOFOL N/A 01/14/2016   Procedure: COLONOSCOPY WITH PROPOFOL;  Surgeon: Manya Silvas, MD;  Location: [facility] ENDOSCOPY;  Service: Endoscopy;  Laterality: N/A;  . TONSILLECTOMY      Social History Social History   Tobacco Use  . Smoking status: Former Research scientist (life sciences)  . Smokeless tobacco: Never Used  Vaping Use  . Vaping Use: Never used  Substance Use Topics  . Alcohol use: No  . Drug use: No    Family History Family History  Problem Relation Age of Onset  . Kidney disease Mother   . Breast cancer Neg Hx     Allergies  Allergen Reactions  . Other Other (See Comments)    Pt does not receive Blood   . Penicillins Hives    Has patient had a PCN reaction causing immediate rash, facial/tongue/throat swelling, SOB or lightheadedness with hypotension: YES Has patient had a PCN reaction causing severe rash involving mucus membranes or skin necrosis: no Has patient had a PCN reaction that required hospitalization: no Has patient had a PCN reaction occurring within the last 10 years: no If all of the above answers are "NO", then may proceed with Cephalosporin use.   . Sodium Bicarbonate Itching     REVIEW OF SYSTEMS (Negative unless checked)  Constitutional: [] Weight loss  [] Fever  [] Chills Cardiac: [] Chest pain   [] Chest pressure   [] Palpitations   [] Shortness of breath when laying flat   []   Shortness of breath with exertion. Vascular:  [] Pain in legs with walking   [] Pain in legs at rest  [] History of DVT   [] Phlebitis   [] Swelling in legs   [] Varicose veins   [] Non-healing ulcers Pulmonary:   [] Uses home oxygen   [] Productive cough   [] Hemoptysis   [] Wheeze  [] COPD   [] Asthma Neurologic:  [] Dizziness   [] Seizures   [] History of stroke   [] History of TIA  [] Aphasia   [] Vissual changes   [] Weakness or numbness in arm   [] Weakness or numbness in leg Musculoskeletal:   [] Joint swelling   [] Joint pain    [] Low back pain Hematologic:  [] Easy bruising  [] Easy bleeding   [] Hypercoagulable state   [] Anemic Gastrointestinal:  [] Diarrhea   [] Vomiting  [] Gastroesophageal reflux/heartburn   [] Difficulty swallowing. Genitourinary:  [x] Chronic kidney disease   [] Difficult urination  [] Frequent urination   [] Blood in urine Skin:  [] Rashes   [] Ulcers  Psychological:  [] History of anxiety   []  History of major depression.  Physical Examination  There were no vitals filed for this visit. There is no height or weight on file to calculate BMI. Gen: WD/WN, NAD Head: [place]/AT, No temporalis wasting.  Ear/Nose/Throat: Hearing grossly intact, nares w/o erythema or drainage Eyes: PER, EOMI, sclera nonicteric.  Neck: Supple, no large masses.   Pulmonary:  Good air movement, no audible wheezing bilaterally, no use of accessory muscles.  Cardiac: RRR, no JVD Vascular: Left upper extremity AV graft good thrill with moderate pulsatility; bruit remains continuous Vessel Right Left  Radial Palpable Palpable  Brachial Palpable Palpable  Gastrointestinal: Non-distended. No guarding/no peritoneal signs.  Musculoskeletal: M/S 5/5 throughout.  No deformity or atrophy.  Neurologic: CN 2-12 intact. Symmetrical.  Speech is fluent. Motor exam as listed above. Psychiatric: Judgment intact, Mood & affect appropriate for pt's clinical situation. Dermatologic: No rashes or ulcers noted.  No changes consistent with cellulitis.  CBC Lab Results  Component Value Date   WBC 4.5 07/20/2020   HGB 10.4 (L) 07/20/2020   HCT 32.1 (L) 07/20/2020   MCV 87.7 07/20/2020   PLT 226 07/20/2020    BMET    Component Value Date/Time   NA 142 07/20/2020 1202   NA 139 07/02/2020 1105   K 4.3 07/20/2020 1202   CL 99 07/20/2020 1202   CO2 32 07/20/2020 1202   GLUCOSE 91 07/20/2020 1202   BUN 29 (H) 07/20/2020 1202   BUN 13 07/02/2020 1105   CREATININE 5.63 (H) 07/20/2020 1202   CALCIUM 8.4 (L) 07/20/2020 1202   GFRNONAA 7 (L)  07/20/2020 1202   GFRAA 8 (L) 07/20/2020 1202   CrCl cannot be calculated (Patient's most recent lab result is older than the maximum 21 days allowed.).  COAG Lab Results  Component Value Date   INR 1.04 07/10/2018   INR 1.04 11/09/2017    Radiology No results found.   Assessment/Plan 1. Complication from renal dialysis device, initial encounter Recommend:  The patient is doing well and currently has adequate dialysis access. The patient's dialysis center is not reporting any access issues. Flow pattern is stable when compared to the prior ultrasound.  The patient should have a duplex ultrasound of the dialysis access in 6 months.  The patient will follow-up with me in the office after each ultrasound    - VAS [place] (AVF, AVG); Future  2. Left carotid stenosis Recommend:  Given the patient's asymptomatic subcritical stenosis no further invasive testing or surgery at this time.  Continue antiplatelet therapy as prescribed Continue management of CAD, HTN and Hyperlipidemia Healthy heart diet,  encouraged exercise at least 4 times per week.  Follow up in 6 months with duplex ultrasound and physical exam   3. Peripheral vascular disease ([place])  Recommend:  The patient has evidence of atherosclerosis of the lower extremities with claudication.  The patient does not voice lifestyle limiting changes at this point in time.  Noninvasive studies do not suggest clinically significant change.  No invasive studies, angiography or surgery at this time The patient should continue walking and begin a more formal exercise program.  The patient should continue antiplatelet therapy and aggressive treatment of the lipid abnormalities  No changes in the patient's medications at this time  The patient should continue wearing graduated compression socks 10-15 mmHg strength to control the mild edema.    4. Essential hypertension Continue antihypertensive  medications as already ordered, these medications have been reviewed and there are no changes at this time.   5. Type 2 diabetes mellitus with other diabetic kidney complication (HCC) Continue hypoglycemic medications as already ordered, these medications have been reviewed and there are no changes at this time.  Hgb A1C to be monitored as already arranged by primary service   Hortencia Pilar, MD  03/17/2021 10:41 AM

## 2021-03-21 DIAGNOSIS — N186 End stage renal disease: Secondary | ICD-10-CM | POA: Diagnosis not present

## 2021-03-21 DIAGNOSIS — D689 Coagulation defect, unspecified: Secondary | ICD-10-CM | POA: Diagnosis not present

## 2021-03-21 DIAGNOSIS — N2581 Secondary hyperparathyroidism of renal origin: Secondary | ICD-10-CM | POA: Diagnosis not present

## 2021-03-21 DIAGNOSIS — Z992 Dependence on renal dialysis: Secondary | ICD-10-CM | POA: Diagnosis not present

## 2021-03-23 ENCOUNTER — Other Ambulatory Visit: Payer: Medicare Other

## 2021-03-23 DIAGNOSIS — N2581 Secondary hyperparathyroidism of renal origin: Secondary | ICD-10-CM | POA: Diagnosis not present

## 2021-03-23 DIAGNOSIS — Z992 Dependence on renal dialysis: Secondary | ICD-10-CM | POA: Diagnosis not present

## 2021-03-23 DIAGNOSIS — D689 Coagulation defect, unspecified: Secondary | ICD-10-CM | POA: Diagnosis not present

## 2021-03-23 DIAGNOSIS — N186 End stage renal disease: Secondary | ICD-10-CM | POA: Diagnosis not present

## 2021-03-25 ENCOUNTER — Other Ambulatory Visit: Payer: Medicare Other

## 2021-03-25 DIAGNOSIS — Z992 Dependence on renal dialysis: Secondary | ICD-10-CM | POA: Diagnosis not present

## 2021-03-25 DIAGNOSIS — D689 Coagulation defect, unspecified: Secondary | ICD-10-CM | POA: Diagnosis not present

## 2021-03-25 DIAGNOSIS — N186 End stage renal disease: Secondary | ICD-10-CM | POA: Diagnosis not present

## 2021-03-25 DIAGNOSIS — N2581 Secondary hyperparathyroidism of renal origin: Secondary | ICD-10-CM | POA: Diagnosis not present

## 2021-03-27 ENCOUNTER — Other Ambulatory Visit: Payer: Self-pay | Admitting: Internal Medicine

## 2021-03-27 DIAGNOSIS — K219 Gastro-esophageal reflux disease without esophagitis: Secondary | ICD-10-CM

## 2021-03-28 DIAGNOSIS — N2581 Secondary hyperparathyroidism of renal origin: Secondary | ICD-10-CM | POA: Diagnosis not present

## 2021-03-28 DIAGNOSIS — D689 Coagulation defect, unspecified: Secondary | ICD-10-CM | POA: Diagnosis not present

## 2021-03-28 DIAGNOSIS — N186 End stage renal disease: Secondary | ICD-10-CM | POA: Diagnosis not present

## 2021-03-28 DIAGNOSIS — Z992 Dependence on renal dialysis: Secondary | ICD-10-CM | POA: Diagnosis not present

## 2021-03-30 ENCOUNTER — Encounter (INDEPENDENT_AMBULATORY_CARE_PROVIDER_SITE_OTHER): Payer: Self-pay | Admitting: Vascular Surgery

## 2021-03-30 DIAGNOSIS — Z992 Dependence on renal dialysis: Secondary | ICD-10-CM | POA: Diagnosis not present

## 2021-03-30 DIAGNOSIS — N186 End stage renal disease: Secondary | ICD-10-CM | POA: Diagnosis not present

## 2021-03-30 DIAGNOSIS — D689 Coagulation defect, unspecified: Secondary | ICD-10-CM | POA: Diagnosis not present

## 2021-03-30 DIAGNOSIS — N2581 Secondary hyperparathyroidism of renal origin: Secondary | ICD-10-CM | POA: Diagnosis not present

## 2021-04-01 ENCOUNTER — Other Ambulatory Visit: Payer: Self-pay | Admitting: Internal Medicine

## 2021-04-01 DIAGNOSIS — N2581 Secondary hyperparathyroidism of renal origin: Secondary | ICD-10-CM | POA: Diagnosis not present

## 2021-04-01 DIAGNOSIS — Z992 Dependence on renal dialysis: Secondary | ICD-10-CM | POA: Diagnosis not present

## 2021-04-01 DIAGNOSIS — I6522 Occlusion and stenosis of left carotid artery: Secondary | ICD-10-CM

## 2021-04-01 DIAGNOSIS — D689 Coagulation defect, unspecified: Secondary | ICD-10-CM | POA: Diagnosis not present

## 2021-04-01 DIAGNOSIS — N186 End stage renal disease: Secondary | ICD-10-CM | POA: Diagnosis not present

## 2021-04-02 DIAGNOSIS — N186 End stage renal disease: Secondary | ICD-10-CM | POA: Diagnosis not present

## 2021-04-02 DIAGNOSIS — N040 Nephrotic syndrome with minor glomerular abnormality: Secondary | ICD-10-CM | POA: Diagnosis not present

## 2021-04-02 DIAGNOSIS — Z992 Dependence on renal dialysis: Secondary | ICD-10-CM | POA: Diagnosis not present

## 2021-04-04 ENCOUNTER — Telehealth: Payer: Self-pay | Admitting: Internal Medicine

## 2021-04-04 DIAGNOSIS — N186 End stage renal disease: Secondary | ICD-10-CM | POA: Diagnosis not present

## 2021-04-04 DIAGNOSIS — Z992 Dependence on renal dialysis: Secondary | ICD-10-CM | POA: Diagnosis not present

## 2021-04-04 DIAGNOSIS — D689 Coagulation defect, unspecified: Secondary | ICD-10-CM | POA: Diagnosis not present

## 2021-04-04 DIAGNOSIS — N2581 Secondary hyperparathyroidism of renal origin: Secondary | ICD-10-CM | POA: Diagnosis not present

## 2021-04-04 NOTE — Progress Notes (Signed)
  Chronic Care Management   Outreach Note  04/04/2021 Name: Kristen Francis MRN: [id number] DOB: [birth year]  Referred by: Lavera Guise, MD Reason for referral : No chief complaint on file.   An unsuccessful telephone outreach was attempted today. The patient was referred to the pharmacist for assistance with care management and care coordination.   Follow Up Plan:   Carley Perdue [employer] Scheduler

## 2021-04-06 DIAGNOSIS — N2581 Secondary hyperparathyroidism of renal origin: Secondary | ICD-10-CM | POA: Diagnosis not present

## 2021-04-06 DIAGNOSIS — N186 End stage renal disease: Secondary | ICD-10-CM | POA: Diagnosis not present

## 2021-04-06 DIAGNOSIS — D689 Coagulation defect, unspecified: Secondary | ICD-10-CM | POA: Diagnosis not present

## 2021-04-06 DIAGNOSIS — Z992 Dependence on renal dialysis: Secondary | ICD-10-CM | POA: Diagnosis not present

## 2021-04-08 ENCOUNTER — Other Ambulatory Visit: Payer: Self-pay | Admitting: Internal Medicine

## 2021-04-08 DIAGNOSIS — D689 Coagulation defect, unspecified: Secondary | ICD-10-CM | POA: Diagnosis not present

## 2021-04-08 DIAGNOSIS — N2581 Secondary hyperparathyroidism of renal origin: Secondary | ICD-10-CM | POA: Diagnosis not present

## 2021-04-08 DIAGNOSIS — Z992 Dependence on renal dialysis: Secondary | ICD-10-CM | POA: Diagnosis not present

## 2021-04-08 DIAGNOSIS — K219 Gastro-esophageal reflux disease without esophagitis: Secondary | ICD-10-CM

## 2021-04-08 DIAGNOSIS — N186 End stage renal disease: Secondary | ICD-10-CM | POA: Diagnosis not present

## 2021-04-11 DIAGNOSIS — N2581 Secondary hyperparathyroidism of renal origin: Secondary | ICD-10-CM | POA: Diagnosis not present

## 2021-04-11 DIAGNOSIS — Z992 Dependence on renal dialysis: Secondary | ICD-10-CM | POA: Diagnosis not present

## 2021-04-11 DIAGNOSIS — N186 End stage renal disease: Secondary | ICD-10-CM | POA: Diagnosis not present

## 2021-04-11 DIAGNOSIS — D689 Coagulation defect, unspecified: Secondary | ICD-10-CM | POA: Diagnosis not present

## 2021-04-13 ENCOUNTER — Other Ambulatory Visit: Payer: Medicare Other

## 2021-04-13 DIAGNOSIS — D689 Coagulation defect, unspecified: Secondary | ICD-10-CM | POA: Diagnosis not present

## 2021-04-13 DIAGNOSIS — N186 End stage renal disease: Secondary | ICD-10-CM | POA: Diagnosis not present

## 2021-04-13 DIAGNOSIS — N2581 Secondary hyperparathyroidism of renal origin: Secondary | ICD-10-CM | POA: Diagnosis not present

## 2021-04-13 DIAGNOSIS — Z992 Dependence on renal dialysis: Secondary | ICD-10-CM | POA: Diagnosis not present

## 2021-04-15 DIAGNOSIS — D689 Coagulation defect, unspecified: Secondary | ICD-10-CM | POA: Diagnosis not present

## 2021-04-15 DIAGNOSIS — N2581 Secondary hyperparathyroidism of renal origin: Secondary | ICD-10-CM | POA: Diagnosis not present

## 2021-04-15 DIAGNOSIS — Z992 Dependence on renal dialysis: Secondary | ICD-10-CM | POA: Diagnosis not present

## 2021-04-15 DIAGNOSIS — N186 End stage renal disease: Secondary | ICD-10-CM | POA: Diagnosis not present

## 2021-04-18 DIAGNOSIS — N2581 Secondary hyperparathyroidism of renal origin: Secondary | ICD-10-CM | POA: Diagnosis not present

## 2021-04-18 DIAGNOSIS — Z992 Dependence on renal dialysis: Secondary | ICD-10-CM | POA: Diagnosis not present

## 2021-04-18 DIAGNOSIS — D689 Coagulation defect, unspecified: Secondary | ICD-10-CM | POA: Diagnosis not present

## 2021-04-18 DIAGNOSIS — N186 End stage renal disease: Secondary | ICD-10-CM | POA: Diagnosis not present

## 2021-04-18 DIAGNOSIS — D631 Anemia in chronic kidney disease: Secondary | ICD-10-CM | POA: Diagnosis not present

## 2021-04-20 ENCOUNTER — Telehealth: Payer: Self-pay | Admitting: Internal Medicine

## 2021-04-20 DIAGNOSIS — Z992 Dependence on renal dialysis: Secondary | ICD-10-CM | POA: Diagnosis not present

## 2021-04-20 DIAGNOSIS — D689 Coagulation defect, unspecified: Secondary | ICD-10-CM | POA: Diagnosis not present

## 2021-04-20 DIAGNOSIS — D631 Anemia in chronic kidney disease: Secondary | ICD-10-CM | POA: Diagnosis not present

## 2021-04-20 DIAGNOSIS — N186 End stage renal disease: Secondary | ICD-10-CM | POA: Diagnosis not present

## 2021-04-20 DIAGNOSIS — N2581 Secondary hyperparathyroidism of renal origin: Secondary | ICD-10-CM | POA: Diagnosis not present

## 2021-04-20 NOTE — Progress Notes (Signed)
  Chronic Care Management   Outreach Note  04/20/2021 Name: Kristen Francis MRN: [id number] DOB: June 27, 1946  Referred by: Lavera Guise, MD Reason for referral : No chief complaint on file.   A second unsuccessful telephone outreach was attempted today. The patient was referred to pharmacist for assistance with care management and care coordination.  Follow Up Plan:   [place]

## 2021-04-22 DIAGNOSIS — N2581 Secondary hyperparathyroidism of renal origin: Secondary | ICD-10-CM | POA: Diagnosis not present

## 2021-04-22 DIAGNOSIS — D631 Anemia in chronic kidney disease: Secondary | ICD-10-CM | POA: Diagnosis not present

## 2021-04-22 DIAGNOSIS — Z992 Dependence on renal dialysis: Secondary | ICD-10-CM | POA: Diagnosis not present

## 2021-04-22 DIAGNOSIS — D689 Coagulation defect, unspecified: Secondary | ICD-10-CM | POA: Diagnosis not present

## 2021-04-22 DIAGNOSIS — N186 End stage renal disease: Secondary | ICD-10-CM | POA: Diagnosis not present

## 2021-04-25 DIAGNOSIS — D689 Coagulation defect, unspecified: Secondary | ICD-10-CM | POA: Diagnosis not present

## 2021-04-25 DIAGNOSIS — N2581 Secondary hyperparathyroidism of renal origin: Secondary | ICD-10-CM | POA: Diagnosis not present

## 2021-04-25 DIAGNOSIS — Z992 Dependence on renal dialysis: Secondary | ICD-10-CM | POA: Diagnosis not present

## 2021-04-25 DIAGNOSIS — N186 End stage renal disease: Secondary | ICD-10-CM | POA: Diagnosis not present

## 2021-04-27 DIAGNOSIS — D689 Coagulation defect, unspecified: Secondary | ICD-10-CM | POA: Diagnosis not present

## 2021-04-27 DIAGNOSIS — Z992 Dependence on renal dialysis: Secondary | ICD-10-CM | POA: Diagnosis not present

## 2021-04-27 DIAGNOSIS — N2581 Secondary hyperparathyroidism of renal origin: Secondary | ICD-10-CM | POA: Diagnosis not present

## 2021-04-27 DIAGNOSIS — N186 End stage renal disease: Secondary | ICD-10-CM | POA: Diagnosis not present

## 2021-04-29 ENCOUNTER — Other Ambulatory Visit: Payer: Self-pay

## 2021-04-29 ENCOUNTER — Ambulatory Visit (INDEPENDENT_AMBULATORY_CARE_PROVIDER_SITE_OTHER): Payer: Medicare Other | Admitting: Physician Assistant

## 2021-04-29 DIAGNOSIS — E042 Nontoxic multinodular goiter: Secondary | ICD-10-CM | POA: Diagnosis not present

## 2021-04-29 DIAGNOSIS — D689 Coagulation defect, unspecified: Secondary | ICD-10-CM | POA: Diagnosis not present

## 2021-04-29 DIAGNOSIS — R630 Anorexia: Secondary | ICD-10-CM

## 2021-04-29 DIAGNOSIS — K219 Gastro-esophageal reflux disease without esophagitis: Secondary | ICD-10-CM | POA: Diagnosis not present

## 2021-04-29 DIAGNOSIS — R011 Cardiac murmur, unspecified: Secondary | ICD-10-CM

## 2021-04-29 DIAGNOSIS — I6523 Occlusion and stenosis of bilateral carotid arteries: Secondary | ICD-10-CM | POA: Diagnosis not present

## 2021-04-29 DIAGNOSIS — N186 End stage renal disease: Secondary | ICD-10-CM

## 2021-04-29 DIAGNOSIS — I10 Essential (primary) hypertension: Secondary | ICD-10-CM | POA: Diagnosis not present

## 2021-04-29 DIAGNOSIS — Z992 Dependence on renal dialysis: Secondary | ICD-10-CM

## 2021-04-29 DIAGNOSIS — I700 Atherosclerosis of aorta: Secondary | ICD-10-CM

## 2021-04-29 DIAGNOSIS — N2581 Secondary hyperparathyroidism of renal origin: Secondary | ICD-10-CM | POA: Diagnosis not present

## 2021-04-29 NOTE — Progress Notes (Signed)
[facility], [place] [address]  Internal MEDICINE  Office Visit Note  Patient Name: Kristen Francis  [id number]  [id number]  Date of Service: 05/02/2021  Chief Complaint  Patient presents with  . Follow-up    nausea,thyroid US results,pt has standing order for abdominal and carotid US are those still needed? Will pt need to have thyroid checked every few months to check growth?   . Hyperlipidemia  . Hypertension  . Anemia  . Quality Metric Gaps    Shingrix, a1c pt not diabetic     HPI Pt is here for routine follow up. She is very tired during visit due to dialysis this AM -Does not take BP meds on dialysis days and did have dialysis today -She is supposed to have a repeat carotid US in oct -She still needs her thyroid US to be scheduled, this has been difficult to scheduled around dialysis, but will move forward scheduling now. -Nausea a little better, but appetite is still reduced on days with dialysis. Had ordered an Abdominal US back in January to investigate other causes of reduced appetite and nausea apart from dialysis, but has not scheduled this yet either--will do so now. Has put on a few pounds since last visit which she is excited about.  Current Medication: Outpatient Encounter Medications as of 04/29/2021  Medication Sig  . allopurinol (ZYLOPRIM) 100 MG tablet Take 1 tablet (100 mg total) by mouth daily.  . B Complex-C-Folic Acid (RENAL-VITE) 0.8 MG TABS Take by mouth.  . carvedilol (COREG) 25 MG tablet Take 25 mg by mouth 2 (two) times daily with a meal.  . cholecalciferol (VITAMIN D) 1000 units tablet Take 1,000 Units by mouth daily.   . ferrous sulfate 325 (65 FE) MG tablet Take 325 mg by mouth daily.   Marland Kitchen lidocaine-prilocaine (EMLA) cream APPLY SMALL AMOUNT TO ACCESS SITE (AVF) 3 TIMES A WEEK 1 HOUR BEFORE DIALYSIS. COVER WITH OCCLUSIVE DRESSING (SARAN WRAP)  . losartan (COZAAR) 50 MG tablet Take 50 mg by mouth daily.  .  Methoxy PEG-Epoetin Beta (MIRCERA IJ) Mircera  . mirtazapine (REMERON) 7.5 MG tablet Take 1 tablet (7.5 mg total) by mouth at bedtime.  Marland Kitchen omeprazole (PRILOSEC) 40 MG capsule TAKE ONE CAPSULE BY MOUTH DAILY  . promethazine (PHENERGAN) 12.5 MG tablet Take 1 tablet (12.5 mg total) by mouth every 8 (eight) hours as needed for nausea or vomiting.  . rosuvastatin (CRESTOR) 5 MG tablet TAKE 1 TABLET BY MOUTH DAILY. TAKE ON DAYS NOT HAVING DIALYSIS.  . [DISCONTINUED] amLODipine (NORVASC) 10 MG tablet TAKE 1 TABLET(10 MG) BY MOUTH DAILY  . [DISCONTINUED] cinacalcet (SENSIPAR) 60 MG tablet Take by mouth. (Patient not taking: Reported on 04/29/2021)  . [DISCONTINUED] cinacalcet (SENSIPAR) 60 MG tablet Take 60 mg by mouth daily. (Patient not taking: Reported on 04/29/2021)  . [DISCONTINUED] cinacalcet (SENSIPAR) 90 MG tablet Take 90 mg by mouth daily. (Patient not taking: Reported on 04/29/2021)  . [DISCONTINUED] torsemide (DEMADEX) 5 MG tablet Take 5 mg by mouth daily.  (Patient not taking: Reported on 04/29/2021)   No facility-administered encounter medications on file as of 04/29/2021.    Surgical History: Past Surgical History:  Procedure Laterality Date  . A/V FISTULAGRAM Left 12/25/2018   Procedure: A/V FISTULAGRAM;  Surgeon: Katha Cabal, MD;  Location: [place] [facility];  Service: Cardiovascular;  Laterality: Left;  . ABDOMINAL HYSTERECTOMY    . APPENDECTOMY    . AV FISTULA PLACEMENT Left 07/12/2018  Procedure: INSERTION OF ARTERIOVENOUS (AV) GORE-TEX GRAFT ARM ( BRACHIAL AXILLARY );  Surgeon: Katha Cabal, MD;  Location: [facility] ORS;  Service: Vascular;  Laterality: Left;  . COLONOSCOPY WITH PROPOFOL N/A 01/14/2016   Procedure: COLONOSCOPY WITH PROPOFOL;  Surgeon: Manya Silvas, MD;  Location: [facility] ENDOSCOPY;  Service: Endoscopy;  Laterality: N/A;  . TONSILLECTOMY      Medical History: Past Medical History:  Diagnosis Date  . Anemia   . Arthritis   . Chronic kidney disease     ESRD  . Complication of anesthesia    had procedure and felt incision early 80s  . Gout   . Hyperlipemia   . Hypertension   . Macular degeneration, right eye   . Stroke [facility])     Family History: Family History  Problem Relation Age of Onset  . Kidney disease Mother   . Breast cancer Neg Hx     Social History   Socioeconomic History  . Marital status: Married    Spouse name: Not on file  . Number of children: Not on file  . Years of education: Not on file  . Highest education level: Not on file  Occupational History  . Not on file  Tobacco Use  . Smoking status: Former Research scientist (life sciences)  . Smokeless tobacco: Never Used  Vaping Use  . Vaping Use: Never used  Substance and Sexual Activity  . Alcohol use: No  . Drug use: No  . Sexual activity: Not on file  Other Topics Concern  . Not on file  Social History Narrative   Lives in  [place]; used to clean; no smoking; no alcohol.    Social Determinants of Health   Financial Resource Strain: Not on file  Food Insecurity: Not on file  Transportation Needs: Not on file  Physical Activity: Not on file  Stress: Not on file  Social Connections: Not on file  Intimate Partner Violence: Not on file      Review of Systems  Constitutional: Positive for appetite change and fatigue. Negative for chills and unexpected weight change.  HENT: Negative for congestion, postnasal drip, rhinorrhea, sneezing and sore throat.   Eyes: Negative for redness.  Respiratory: Negative for cough, chest tightness and shortness of breath.   Cardiovascular: Negative for chest pain and palpitations.  Gastrointestinal: Positive for abdominal pain and nausea. Negative for constipation, diarrhea and vomiting.  Genitourinary: Negative for dysuria and frequency.  Musculoskeletal: Negative for arthralgias, back pain, joint swelling and neck pain.  Skin: Negative for rash.  Neurological: Negative.  Negative for tremors and numbness.  Hematological: Negative  for adenopathy. Does not bruise/bleed easily.  Psychiatric/Behavioral: Negative for behavioral problems (Depression), sleep disturbance and suicidal ideas. The patient is not nervous/anxious.     Vital Signs: BP (!) 146/50   Pulse 80   Temp 97.9 F (36.6 C)   Resp 16   Ht 5\' 3"  (1.6 m)   Wt 123 lb 9.6 oz (56.1 kg)   SpO2 98%   BMI 21.89 kg/m    Physical Exam Vitals and nursing note reviewed.  Constitutional:      General: She is not in acute distress.    Appearance: She is well-developed and normal weight. She is not diaphoretic.  HENT:     Head: Normocephalic and atraumatic.     Mouth/Throat:     Pharynx: No oropharyngeal exudate.  Eyes:     Pupils: Pupils are equal, round, and reactive to light.  Neck:     Thyroid:  No thyromegaly.     Vascular: No JVD.     Trachea: No tracheal deviation.  Cardiovascular:     Rate and Rhythm: Normal rate and regular rhythm.     Heart sounds: Murmur heard.  No friction rub. No gallop.   Pulmonary:     Effort: Pulmonary effort is normal. No respiratory distress.     Breath sounds: No wheezing or rales.  Chest:     Chest wall: No tenderness.  Abdominal:     General: Bowel sounds are normal.     Palpations: Abdomen is soft.     Tenderness: There is no abdominal tenderness.  Musculoskeletal:        General: Normal range of motion.     Cervical back: Normal range of motion and neck supple.  Lymphadenopathy:     Cervical: No cervical adenopathy.  Skin:    General: Skin is warm and dry.  Neurological:     Mental Status: She is alert and oriented to person, place, and time.     Cranial Nerves: No cranial nerve deficit.  Psychiatric:        Behavior: Behavior normal.        Thought Content: Thought content normal.        Judgment: Judgment normal.        Assessment/Plan: 1. Essential hypertension Generally stable, patient has not had BP meds today due to dialysis--followed closely while at dialysis  2. Multinodular  goiter Wills schedule Thyroid US  3. End stage renal disease ([place]) On dialysis  4. Dependence on renal dialysis [facility]) Dialysis MWF, followed by nephrology  5. Decreased appetite Will schedule abdominal US  6. Gastroesophageal reflux disease without esophagitis Continue PPI  7. Atherosclerosis of aorta ([place]) Continue crestor  8. Bilateral carotid artery stenosis Continue crestor, f/u US in Oct  9. Murmur, cardiac Will order an echo for further evaluation - ECHOCARDIOGRAM COMPLETE   General Counseling: Miyani verbalizes understanding of the findings of todays visit and agrees with plan of treatment. I have discussed any further diagnostic evaluation that may be needed or ordered today. We also reviewed her medications today. she has been encouraged to call the office with any questions or concerns that should arise related to todays visit.    Orders Placed This Encounter  Procedures  . ECHOCARDIOGRAM COMPLETE    No orders of the defined types were placed in this encounter.   This patient was seen by Drema Dallas, PA-C in collaboration with Dr. Clayborn Bigness as a part of collaborative care agreement.   Total time spent:40 Minutes Time spent includes review of chart, medications, test results, and follow up plan with the patient.      Dr Lavera Guise Internal medicine

## 2021-05-01 ENCOUNTER — Other Ambulatory Visit: Payer: Self-pay | Admitting: Physician Assistant

## 2021-05-02 DIAGNOSIS — D689 Coagulation defect, unspecified: Secondary | ICD-10-CM | POA: Diagnosis not present

## 2021-05-02 DIAGNOSIS — N2581 Secondary hyperparathyroidism of renal origin: Secondary | ICD-10-CM | POA: Diagnosis not present

## 2021-05-02 DIAGNOSIS — Z992 Dependence on renal dialysis: Secondary | ICD-10-CM | POA: Diagnosis not present

## 2021-05-02 DIAGNOSIS — N186 End stage renal disease: Secondary | ICD-10-CM | POA: Diagnosis not present

## 2021-05-03 DIAGNOSIS — N186 End stage renal disease: Secondary | ICD-10-CM | POA: Diagnosis not present

## 2021-05-03 DIAGNOSIS — N040 Nephrotic syndrome with minor glomerular abnormality: Secondary | ICD-10-CM | POA: Diagnosis not present

## 2021-05-03 DIAGNOSIS — Z992 Dependence on renal dialysis: Secondary | ICD-10-CM | POA: Diagnosis not present

## 2021-05-04 DIAGNOSIS — D509 Iron deficiency anemia, unspecified: Secondary | ICD-10-CM | POA: Diagnosis not present

## 2021-05-04 DIAGNOSIS — D689 Coagulation defect, unspecified: Secondary | ICD-10-CM | POA: Diagnosis not present

## 2021-05-04 DIAGNOSIS — Z992 Dependence on renal dialysis: Secondary | ICD-10-CM | POA: Diagnosis not present

## 2021-05-04 DIAGNOSIS — N186 End stage renal disease: Secondary | ICD-10-CM | POA: Diagnosis not present

## 2021-05-04 DIAGNOSIS — N2581 Secondary hyperparathyroidism of renal origin: Secondary | ICD-10-CM | POA: Diagnosis not present

## 2021-05-06 DIAGNOSIS — N186 End stage renal disease: Secondary | ICD-10-CM | POA: Diagnosis not present

## 2021-05-06 DIAGNOSIS — Z992 Dependence on renal dialysis: Secondary | ICD-10-CM | POA: Diagnosis not present

## 2021-05-06 DIAGNOSIS — D689 Coagulation defect, unspecified: Secondary | ICD-10-CM | POA: Diagnosis not present

## 2021-05-06 DIAGNOSIS — N2581 Secondary hyperparathyroidism of renal origin: Secondary | ICD-10-CM | POA: Diagnosis not present

## 2021-05-06 DIAGNOSIS — D509 Iron deficiency anemia, unspecified: Secondary | ICD-10-CM | POA: Diagnosis not present

## 2021-05-09 ENCOUNTER — Encounter: Payer: Self-pay | Admitting: Physician Assistant

## 2021-05-09 DIAGNOSIS — N2581 Secondary hyperparathyroidism of renal origin: Secondary | ICD-10-CM | POA: Diagnosis not present

## 2021-05-09 DIAGNOSIS — Z992 Dependence on renal dialysis: Secondary | ICD-10-CM | POA: Diagnosis not present

## 2021-05-09 DIAGNOSIS — D689 Coagulation defect, unspecified: Secondary | ICD-10-CM | POA: Diagnosis not present

## 2021-05-09 DIAGNOSIS — N186 End stage renal disease: Secondary | ICD-10-CM | POA: Diagnosis not present

## 2021-05-11 DIAGNOSIS — N186 End stage renal disease: Secondary | ICD-10-CM | POA: Diagnosis not present

## 2021-05-11 DIAGNOSIS — D689 Coagulation defect, unspecified: Secondary | ICD-10-CM | POA: Diagnosis not present

## 2021-05-11 DIAGNOSIS — Z992 Dependence on renal dialysis: Secondary | ICD-10-CM | POA: Diagnosis not present

## 2021-05-11 DIAGNOSIS — N2581 Secondary hyperparathyroidism of renal origin: Secondary | ICD-10-CM | POA: Diagnosis not present

## 2021-05-13 DIAGNOSIS — N186 End stage renal disease: Secondary | ICD-10-CM | POA: Diagnosis not present

## 2021-05-13 DIAGNOSIS — D689 Coagulation defect, unspecified: Secondary | ICD-10-CM | POA: Diagnosis not present

## 2021-05-13 DIAGNOSIS — N2581 Secondary hyperparathyroidism of renal origin: Secondary | ICD-10-CM | POA: Diagnosis not present

## 2021-05-13 DIAGNOSIS — Z992 Dependence on renal dialysis: Secondary | ICD-10-CM | POA: Diagnosis not present

## 2021-05-16 ENCOUNTER — Ambulatory Visit (INDEPENDENT_AMBULATORY_CARE_PROVIDER_SITE_OTHER): Payer: Medicare Other | Admitting: Physician Assistant

## 2021-05-16 ENCOUNTER — Other Ambulatory Visit: Payer: Self-pay

## 2021-05-16 DIAGNOSIS — I10 Essential (primary) hypertension: Secondary | ICD-10-CM | POA: Diagnosis not present

## 2021-05-16 DIAGNOSIS — D689 Coagulation defect, unspecified: Secondary | ICD-10-CM | POA: Diagnosis not present

## 2021-05-16 DIAGNOSIS — Z992 Dependence on renal dialysis: Secondary | ICD-10-CM

## 2021-05-16 DIAGNOSIS — N186 End stage renal disease: Secondary | ICD-10-CM | POA: Diagnosis not present

## 2021-05-16 DIAGNOSIS — R21 Rash and other nonspecific skin eruption: Secondary | ICD-10-CM | POA: Diagnosis not present

## 2021-05-16 DIAGNOSIS — N2581 Secondary hyperparathyroidism of renal origin: Secondary | ICD-10-CM | POA: Diagnosis not present

## 2021-05-16 DIAGNOSIS — L239 Allergic contact dermatitis, unspecified cause: Secondary | ICD-10-CM | POA: Diagnosis not present

## 2021-05-16 MED ORDER — HYDRALAZINE HCL 10 MG PO TABS: 10.0000 mg | ORAL_TABLET | Freq: Two times a day (BID) | ORAL | 2 refills | Status: DC

## 2021-05-16 MED ORDER — TRIAMCINOLONE ACETONIDE 0.1 % EX CREA: 1.0000 "application " | TOPICAL_CREAM | Freq: Two times a day (BID) | CUTANEOUS | 0 refills | Status: DC

## 2021-05-16 NOTE — Progress Notes (Signed)
[facility] [place], [place] [address]  Internal MEDICINE  Office Visit Note  Patient Name: Kristen Francis  [id number]  [id number]  Date of Service: 05/17/2021  Chief Complaint  Patient presents with   Acute Visit    Rash all over, knot on back on neck right arm inside of elbow, tender under left breast, back burning and itching, discuss meds     HPI Pt is here for a sick visit. -2-3 days ago noticed rash with small bump on inside r arm. Next noticed in inguinal area on right side.  Then her neck. Under left breast/ribs and then her back was the last she noticed. They itch and are a little tender. Tried a cream she had at home but it didn't help very much. Had been outside, but thinks these started prior to then.  -rena-vite was started about 2 weeks ago and briefly discussed with dialsysis nurse who didn't think the vitamin was the cause. Was told to hold off on pill.  Thinks rash is worse. No there medication changes. -Bp also remains high, pt was taken off torsemide by nephrology since she is not retaining fluid anymore. Will continue amlodipine and carvedilol and add hydralazine twice per day. Pt will monitor BP and hold dose if Bp low, especially on dialysis days.  Current Medication:  Outpatient Encounter Medications as of 05/16/2021  Medication Sig   hydrALAZINE (APRESOLINE) 10 MG tablet Take 1 tablet (10 mg total) by mouth 2 (two) times daily.   triamcinolone cream (KENALOG) 0.1 % Apply 1 application topically 2 (two) times daily.   allopurinol (ZYLOPRIM) 100 MG tablet Take 1 tablet (100 mg total) by mouth daily.   amLODipine (NORVASC) 10 MG tablet TAKE 1 TABLET(10 MG) BY MOUTH DAILY   B Complex-C-Folic Acid (RENAL-VITE) 0.8 MG TABS Take by mouth.   carvedilol (COREG) 25 MG tablet Take 25 mg by mouth 2 (two) times daily with a meal.   cholecalciferol (VITAMIN D) 1000 units tablet Take 1,000 Units by mouth daily.    ferrous sulfate 325 (65 FE)  MG tablet Take 325 mg by mouth daily.    lidocaine-prilocaine (EMLA) cream APPLY SMALL AMOUNT TO ACCESS SITE (AVF) 3 TIMES A WEEK 1 HOUR BEFORE DIALYSIS. COVER WITH OCCLUSIVE DRESSING (SARAN WRAP)   losartan (COZAAR) 50 MG tablet Take 50 mg by mouth daily.   Methoxy PEG-Epoetin Beta (MIRCERA IJ) Mircera   mirtazapine (REMERON) 7.5 MG tablet Take 1 tablet (7.5 mg total) by mouth at bedtime.   omeprazole (PRILOSEC) 40 MG capsule TAKE ONE CAPSULE BY MOUTH DAILY   promethazine (PHENERGAN) 12.5 MG tablet Take 1 tablet (12.5 mg total) by mouth every 8 (eight) hours as needed for nausea or vomiting.   rosuvastatin (CRESTOR) 5 MG tablet TAKE 1 TABLET BY MOUTH DAILY. TAKE ON DAYS NOT HAVING DIALYSIS.   No facility-administered encounter medications on file as of 05/16/2021.      Medical History: Past Medical History:  Diagnosis Date   Anemia    Arthritis    Chronic kidney disease    ESRD   Complication of anesthesia    had procedure and felt incision early 80s   Gout    Hyperlipemia    Hypertension    Macular degeneration, right eye    Stroke ([place])      Vital Signs: BP (!) 150/66   Pulse 75   Temp 97.8 F (36.6 C)   Resp 16   Ht 5\' 3"  (1.6 m)  Wt 121 lb 9.6 oz (55.2 kg)   SpO2 97%   BMI 21.54 kg/m    Review of Systems  Constitutional:  Negative for fatigue and fever.  HENT:  Negative for congestion, mouth sores and postnasal drip.   Respiratory:  Negative for cough.   Cardiovascular:  Negative for chest pain.  Genitourinary:  Negative for flank pain.  Skin:  Positive for rash.       Small rash on right arm, under left breast, r inguinal region, and back of neck. Itchy and red.  Psychiatric/Behavioral: Negative.     Physical Exam Vitals and nursing note reviewed.  Constitutional:      General: She is not in acute distress.    Appearance: She is well-developed and normal weight. She is not diaphoretic.  HENT:     Head: Normocephalic and atraumatic.     Mouth/Throat:      Pharynx: No oropharyngeal exudate.  Eyes:     Pupils: Pupils are equal, round, and reactive to light.  Neck:     Thyroid: No thyromegaly.     Vascular: No JVD.     Trachea: No tracheal deviation.  Cardiovascular:     Rate and Rhythm: Normal rate and regular rhythm.     Heart sounds: Normal heart sounds. No murmur heard.   No friction rub. No gallop.  Pulmonary:     Effort: Pulmonary effort is normal. No respiratory distress.     Breath sounds: No wheezing or rales.  Chest:     Chest wall: No tenderness.  Abdominal:     General: Bowel sounds are normal.     Palpations: Abdomen is soft.  Musculoskeletal:        General: Normal range of motion.     Cervical back: Normal range of motion and neck supple.  Lymphadenopathy:     Cervical: No cervical adenopathy.  Skin:    General: Skin is warm and dry.     Findings: Rash present.     Comments: Small rash with small bump on r arm, r inguinal region, under left breast, and spot on back of neck. mildly tender to palpation  Neurological:     Mental Status: She is alert and oriented to person, place, and time.     Cranial Nerves: No cranial nerve deficit.  Psychiatric:        Behavior: Behavior normal.        Thought Content: Thought content normal.        Judgment: Judgment normal.      Assessment/Plan: 1. Rash Pt stopped new vitamin and educated to start on Claritin during the day and may use benadryl at night as needed. Given kenalog cream to apply to areas since single small bump at each location. Will consider prednisone if further spread or not improving. - triamcinolone cream (KENALOG) 0.1 %; Apply 1 application topically 2 (two) times daily.  Dispense: 30 g; Refill: 0  2. Essential hypertension Will add hydralazine BID, but pt will hold on dialysis days if BP low. - hydrALAZINE (APRESOLINE) 10 MG tablet; Take 1 tablet (10 mg total) by mouth 2 (two) times daily.  Dispense: 60 tablet; Refill: 2  3. End stage renal  disease ([place]) Followed by nephrology, on dialysis MWF  4. Dependence on renal dialysis [facility]) Followed by nephrology   General Counseling: [name] verbalizes understanding of the findings of todays visit and agrees with plan of treatment. I have discussed any further diagnostic evaluation that may be needed or ordered  today. We also reviewed her medications today. she has been encouraged to call the office with any questions or concerns that should arise related to todays visit.    Counseling:    No orders of the defined types were placed in this encounter.   Meds ordered this encounter  Medications   triamcinolone cream (KENALOG) 0.1 %    Sig: Apply 1 application topically 2 (two) times daily.    Dispense:  30 g    Refill:  0   hydrALAZINE (APRESOLINE) 10 MG tablet    Sig: Take 1 tablet (10 mg total) by mouth 2 (two) times daily.    Dispense:  60 tablet    Refill:  2    Time spent:30 Minutes

## 2021-05-20 DIAGNOSIS — N186 End stage renal disease: Secondary | ICD-10-CM | POA: Diagnosis not present

## 2021-05-20 DIAGNOSIS — D689 Coagulation defect, unspecified: Secondary | ICD-10-CM | POA: Diagnosis not present

## 2021-05-20 DIAGNOSIS — Z992 Dependence on renal dialysis: Secondary | ICD-10-CM | POA: Diagnosis not present

## 2021-05-20 DIAGNOSIS — N2581 Secondary hyperparathyroidism of renal origin: Secondary | ICD-10-CM | POA: Diagnosis not present

## 2021-05-20 DIAGNOSIS — L239 Allergic contact dermatitis, unspecified cause: Secondary | ICD-10-CM | POA: Diagnosis not present

## 2021-05-23 DIAGNOSIS — N186 End stage renal disease: Secondary | ICD-10-CM | POA: Diagnosis not present

## 2021-05-23 DIAGNOSIS — N2581 Secondary hyperparathyroidism of renal origin: Secondary | ICD-10-CM | POA: Diagnosis not present

## 2021-05-23 DIAGNOSIS — D689 Coagulation defect, unspecified: Secondary | ICD-10-CM | POA: Diagnosis not present

## 2021-05-23 DIAGNOSIS — Z992 Dependence on renal dialysis: Secondary | ICD-10-CM | POA: Diagnosis not present

## 2021-05-25 DIAGNOSIS — D689 Coagulation defect, unspecified: Secondary | ICD-10-CM | POA: Diagnosis not present

## 2021-05-25 DIAGNOSIS — Z992 Dependence on renal dialysis: Secondary | ICD-10-CM | POA: Diagnosis not present

## 2021-05-25 DIAGNOSIS — N2581 Secondary hyperparathyroidism of renal origin: Secondary | ICD-10-CM | POA: Diagnosis not present

## 2021-05-25 DIAGNOSIS — N186 End stage renal disease: Secondary | ICD-10-CM | POA: Diagnosis not present

## 2021-05-27 DIAGNOSIS — D689 Coagulation defect, unspecified: Secondary | ICD-10-CM | POA: Diagnosis not present

## 2021-05-27 DIAGNOSIS — N2581 Secondary hyperparathyroidism of renal origin: Secondary | ICD-10-CM | POA: Diagnosis not present

## 2021-05-27 DIAGNOSIS — Z992 Dependence on renal dialysis: Secondary | ICD-10-CM | POA: Diagnosis not present

## 2021-05-27 DIAGNOSIS — N186 End stage renal disease: Secondary | ICD-10-CM | POA: Diagnosis not present

## 2021-05-30 DIAGNOSIS — D631 Anemia in chronic kidney disease: Secondary | ICD-10-CM | POA: Diagnosis not present

## 2021-05-30 DIAGNOSIS — N2581 Secondary hyperparathyroidism of renal origin: Secondary | ICD-10-CM | POA: Diagnosis not present

## 2021-05-30 DIAGNOSIS — N186 End stage renal disease: Secondary | ICD-10-CM | POA: Diagnosis not present

## 2021-05-30 DIAGNOSIS — D689 Coagulation defect, unspecified: Secondary | ICD-10-CM | POA: Diagnosis not present

## 2021-05-30 DIAGNOSIS — Z992 Dependence on renal dialysis: Secondary | ICD-10-CM | POA: Diagnosis not present

## 2021-06-01 DIAGNOSIS — Z992 Dependence on renal dialysis: Secondary | ICD-10-CM | POA: Diagnosis not present

## 2021-06-01 DIAGNOSIS — D631 Anemia in chronic kidney disease: Secondary | ICD-10-CM | POA: Diagnosis not present

## 2021-06-01 DIAGNOSIS — N186 End stage renal disease: Secondary | ICD-10-CM | POA: Diagnosis not present

## 2021-06-01 DIAGNOSIS — N2581 Secondary hyperparathyroidism of renal origin: Secondary | ICD-10-CM | POA: Diagnosis not present

## 2021-06-01 DIAGNOSIS — D689 Coagulation defect, unspecified: Secondary | ICD-10-CM | POA: Diagnosis not present

## 2021-06-02 ENCOUNTER — Telehealth: Payer: Self-pay

## 2021-06-02 DIAGNOSIS — Z992 Dependence on renal dialysis: Secondary | ICD-10-CM | POA: Diagnosis not present

## 2021-06-02 DIAGNOSIS — N040 Nephrotic syndrome with minor glomerular abnormality: Secondary | ICD-10-CM | POA: Diagnosis not present

## 2021-06-02 DIAGNOSIS — N186 End stage renal disease: Secondary | ICD-10-CM | POA: Diagnosis not present

## 2021-06-02 NOTE — Telephone Encounter (Signed)
Pt called that tingling on right  arm on and off as per dr Humphrey Rolls advised  her to take 4 tab aspirin 81 mg once  getting worse go to ED if not then keep appt

## 2021-06-03 ENCOUNTER — Other Ambulatory Visit: Payer: Self-pay

## 2021-06-03 ENCOUNTER — Observation Stay
Admission: EM | Admit: 2021-06-03 | Discharge: 2021-06-04 | Disposition: A | Discharge status: Home / Self Care | Payer: Medicare Other | Attending: Internal Medicine | Admitting: Internal Medicine

## 2021-06-03 ENCOUNTER — Other Ambulatory Visit
Admission: RE | Admit: 2021-06-03 | Discharge: 2021-06-03 | Disposition: A | Discharge status: Home / Self Care | Payer: Medicare Other | Source: Ambulatory Visit | Attending: Nephrology | Admitting: Nephrology

## 2021-06-03 ENCOUNTER — Observation Stay: Payer: Medicare Other

## 2021-06-03 ENCOUNTER — Inpatient Hospital Stay: Admit: 2021-06-03 | Payer: Medicare Other

## 2021-06-03 ENCOUNTER — Emergency Department: Payer: Medicare Other

## 2021-06-03 DIAGNOSIS — N186 End stage renal disease: Secondary | ICD-10-CM | POA: Insufficient documentation

## 2021-06-03 DIAGNOSIS — G459 Transient cerebral ischemic attack, unspecified: Principal | ICD-10-CM | POA: Diagnosis present

## 2021-06-03 DIAGNOSIS — I120 Hypertensive chronic kidney disease with stage 5 chronic kidney disease or end stage renal disease: Secondary | ICD-10-CM | POA: Diagnosis not present

## 2021-06-03 DIAGNOSIS — D649 Anemia, unspecified: Secondary | ICD-10-CM | POA: Diagnosis not present

## 2021-06-03 DIAGNOSIS — Z20822 Contact with and (suspected) exposure to covid-19: Secondary | ICD-10-CM | POA: Insufficient documentation

## 2021-06-03 DIAGNOSIS — D509 Iron deficiency anemia, unspecified: Secondary | ICD-10-CM | POA: Diagnosis not present

## 2021-06-03 DIAGNOSIS — I6381 Other cerebral infarction due to occlusion or stenosis of small artery: Secondary | ICD-10-CM | POA: Diagnosis not present

## 2021-06-03 DIAGNOSIS — D631 Anemia in chronic kidney disease: Secondary | ICD-10-CM | POA: Insufficient documentation

## 2021-06-03 DIAGNOSIS — D689 Coagulation defect, unspecified: Secondary | ICD-10-CM | POA: Diagnosis not present

## 2021-06-03 DIAGNOSIS — R200 Anesthesia of skin: Secondary | ICD-10-CM

## 2021-06-03 DIAGNOSIS — N2581 Secondary hyperparathyroidism of renal origin: Secondary | ICD-10-CM | POA: Diagnosis not present

## 2021-06-03 DIAGNOSIS — E440 Moderate protein-calorie malnutrition: Secondary | ICD-10-CM | POA: Diagnosis not present

## 2021-06-03 DIAGNOSIS — I10 Essential (primary) hypertension: Secondary | ICD-10-CM | POA: Diagnosis present

## 2021-06-03 DIAGNOSIS — R52 Pain, unspecified: Secondary | ICD-10-CM | POA: Diagnosis not present

## 2021-06-03 DIAGNOSIS — I739 Peripheral vascular disease, unspecified: Secondary | ICD-10-CM | POA: Diagnosis present

## 2021-06-03 DIAGNOSIS — Z79899 Other long term (current) drug therapy: Secondary | ICD-10-CM | POA: Insufficient documentation

## 2021-06-03 DIAGNOSIS — Z992 Dependence on renal dialysis: Secondary | ICD-10-CM | POA: Insufficient documentation

## 2021-06-03 DIAGNOSIS — R202 Paresthesia of skin: Secondary | ICD-10-CM | POA: Diagnosis not present

## 2021-06-03 DIAGNOSIS — Z8673 Personal history of transient ischemic attack (TIA), and cerebral infarction without residual deficits: Secondary | ICD-10-CM | POA: Insufficient documentation

## 2021-06-03 DIAGNOSIS — R9082 White matter disease, unspecified: Secondary | ICD-10-CM | POA: Diagnosis not present

## 2021-06-03 DIAGNOSIS — Z87891 Personal history of nicotine dependence: Secondary | ICD-10-CM | POA: Insufficient documentation

## 2021-06-03 DIAGNOSIS — E1122 Type 2 diabetes mellitus with diabetic chronic kidney disease: Secondary | ICD-10-CM | POA: Diagnosis not present

## 2021-06-03 DIAGNOSIS — N189 Chronic kidney disease, unspecified: Secondary | ICD-10-CM | POA: Diagnosis present

## 2021-06-03 DIAGNOSIS — E1129 Type 2 diabetes mellitus with other diabetic kidney complication: Secondary | ICD-10-CM | POA: Diagnosis present

## 2021-06-03 LAB — COMPREHENSIVE METABOLIC PANEL
ALT: 12 U/L (ref 0–44)
AST: 19 U/L (ref 15–41)
Albumin: 3.7 g/dL (ref 3.5–5.0)
Alkaline Phosphatase: 46 U/L (ref 38–126)
Anion gap: 7 (ref 5–15)
BUN: 21 mg/dL (ref 8–23)
CO2: 32 mmol/L (ref 22–32)
Calcium: 8.4 mg/dL — ABNORMAL LOW (ref 8.9–10.3)
Chloride: 100 mmol/L (ref 98–111)
Creatinine, Ser: 3.71 mg/dL — ABNORMAL HIGH (ref 0.44–1.00)
GFR, Estimated: 12 mL/min — ABNORMAL LOW (ref >60)
Glucose, Bld: 136 mg/dL — ABNORMAL HIGH (ref 70–99)
Potassium: 3.4 mmol/L — ABNORMAL LOW (ref 3.5–5.1)
Sodium: 139 mmol/L (ref 135–145)
Total Bilirubin: 0.5 mg/dL (ref 0.3–1.2)
Total Protein: 6.9 g/dL (ref 6.5–8.1)

## 2021-06-03 LAB — DIFFERENTIAL
Abs Immature Granulocytes: 0.05 10*3/uL (ref 0.00–0.07)
Basophils Absolute: 0 10*3/uL (ref 0.0–0.1)
Basophils Relative: 0 %
Eosinophils Absolute: 0.1 10*3/uL (ref 0.0–0.5)
Eosinophils Relative: 2 %
Immature Granulocytes: 1 %
Lymphocytes Relative: 14 %
Lymphs Abs: 1 10*3/uL (ref 0.7–4.0)
Monocytes Absolute: 0.5 10*3/uL (ref 0.1–1.0)
Monocytes Relative: 6 %
Neutro Abs: 5.8 10*3/uL (ref 1.7–7.7)
Neutrophils Relative %: 77 %

## 2021-06-03 LAB — CBC
HCT: 20.3 % — ABNORMAL LOW (ref 36.0–46.0)
Hemoglobin: 6.6 g/dL — ABNORMAL LOW (ref 12.0–15.0)
MCH: 29.7 pg (ref 26.0–34.0)
MCHC: 32.5 g/dL (ref 30.0–36.0)
MCV: 91.4 fL (ref 80.0–100.0)
Platelets: 204 10*3/uL (ref 150–400)
RBC: 2.22 MIL/uL — ABNORMAL LOW (ref 3.87–5.11)
RDW: 17.9 % — ABNORMAL HIGH (ref 11.5–15.5)
WBC: 7.5 10*3/uL (ref 4.0–10.5)
nRBC: 0.7 % — ABNORMAL HIGH (ref 0.0–0.2)

## 2021-06-03 LAB — RESP PANEL BY RT-PCR (FLU A&B, COVID) ARPGX2
Influenza A by PCR: NEGATIVE
Influenza B by PCR: NEGATIVE
SARS Coronavirus 2 by RT PCR: NEGATIVE

## 2021-06-03 LAB — PROTIME-INR
INR: 1 (ref 0.8–1.2)
Prothrombin Time: 13.6 seconds (ref 11.4–15.2)

## 2021-06-03 LAB — HEMOGLOBIN: Hemoglobin: 6.3 g/dL — ABNORMAL LOW (ref 12.0–15.0)

## 2021-06-03 LAB — FOLATE: Folate: 11.6 ng/mL (ref >5.9)

## 2021-06-03 LAB — IRON AND TIBC
Iron: 38 ug/dL (ref 28–170)
Saturation Ratios: 18 % (ref 10.4–31.8)
TIBC: 217 ug/dL — ABNORMAL LOW (ref 250–450)
UIBC: 179 ug/dL

## 2021-06-03 LAB — TYPE AND SCREEN
ABO/RH(D): A POS
Antibody Screen: NEGATIVE

## 2021-06-03 LAB — RETICULOCYTES
Immature Retic Fract: 31.1 % — ABNORMAL HIGH (ref 2.3–15.9)
RBC.: 1.97 MIL/uL — ABNORMAL LOW (ref 3.87–5.11)
Retic Count, Absolute: 75.3 10*3/uL (ref 19.0–186.0)
Retic Ct Pct: 3.8 % — ABNORMAL HIGH (ref 0.4–3.1)

## 2021-06-03 LAB — FERRITIN: Ferritin: 1051 ng/mL — ABNORMAL HIGH (ref 11–307)

## 2021-06-03 LAB — APTT: aPTT: 35 seconds (ref 24–36)

## 2021-06-03 MED ORDER — CINACALCET HCL 30 MG PO TABS
60.0000 mg | ORAL_TABLET | Freq: Every evening | ORAL | Status: DC
  Administered 2021-06-03: 60 mg via ORAL
  Filled 2021-06-03: qty 2

## 2021-06-03 MED ORDER — TRIAMCINOLONE ACETONIDE 0.1 % EX CREA
1.0000 "application " | TOPICAL_CREAM | Freq: Two times a day (BID) | CUTANEOUS | Status: DC | PRN
  Filled 2021-06-03: qty 15

## 2021-06-03 MED ORDER — RENA-VITE PO TABS: 1.0000 | ORAL_TABLET | ORAL | Status: DC

## 2021-06-03 MED ORDER — ALLOPURINOL 100 MG PO TABS
100.0000 mg | ORAL_TABLET | Freq: Every day | ORAL | Status: DC
  Administered 2021-06-04: 100 mg via ORAL
  Filled 2021-06-03 (×2): qty 1

## 2021-06-03 MED ORDER — VITAMIN D 25 MCG (1000 UNIT) PO TABS
1000.0000 [IU] | ORAL_TABLET | Freq: Every day | ORAL | Status: DC
  Administered 2021-06-04: 1000 [IU] via ORAL
  Filled 2021-06-03: qty 1

## 2021-06-03 MED ORDER — PANTOPRAZOLE SODIUM 40 MG PO TBEC
40.0000 mg | DELAYED_RELEASE_TABLET | Freq: Every day | ORAL | Status: DC
  Administered 2021-06-04: 40 mg via ORAL
  Filled 2021-06-03: qty 1

## 2021-06-03 MED ORDER — HYDRALAZINE HCL 10 MG PO TABS
10.0000 mg | ORAL_TABLET | Freq: Two times a day (BID) | ORAL | Status: DC
  Administered 2021-06-03 – 2021-06-04 (×2): 10 mg via ORAL
  Filled 2021-06-03 (×4): qty 1

## 2021-06-03 MED ORDER — FERROUS SULFATE 325 (65 FE) MG PO TABS
325.0000 mg | ORAL_TABLET | Freq: Every day | ORAL | Status: DC
  Administered 2021-06-04: 325 mg via ORAL
  Filled 2021-06-03: qty 1

## 2021-06-03 MED ORDER — AMLODIPINE BESYLATE 5 MG PO TABS
5.0000 mg | ORAL_TABLET | Freq: Every day | ORAL | Status: DC
  Administered 2021-06-03 – 2021-06-04 (×2): 5 mg via ORAL
  Filled 2021-06-03: qty 1

## 2021-06-03 MED ORDER — SODIUM CHLORIDE 0.9% FLUSH
3.0000 mL | Freq: Once | INTRAVENOUS | Status: AC
  Administered 2021-06-03: 3 mL via INTRAVENOUS

## 2021-06-03 MED ORDER — LOSARTAN POTASSIUM 50 MG PO TABS
50.0000 mg | ORAL_TABLET | Freq: Every day | ORAL | Status: DC
  Administered 2021-06-04: 50 mg via ORAL
  Filled 2021-06-03: qty 1

## 2021-06-03 MED ORDER — ROSUVASTATIN CALCIUM 20 MG PO TABS
20.0000 mg | ORAL_TABLET | Freq: Every day | ORAL | Status: DC
  Administered 2021-06-03: 20 mg via ORAL
  Filled 2021-06-03: qty 1

## 2021-06-03 MED ORDER — CARVEDILOL 6.25 MG PO TABS
25.0000 mg | ORAL_TABLET | Freq: Two times a day (BID) | ORAL | Status: DC
  Administered 2021-06-04: 25 mg via ORAL
  Filled 2021-06-03: qty 4

## 2021-06-03 NOTE — ED Triage Notes (Signed)
Pt c/o right arm and leg numbness for the past 3 days and today when she went to dialysis they told her several of her labs were off and her iron was low. Pt denies any other sx at this time, pt is a/ox4.

## 2021-06-03 NOTE — ED Provider Notes (Signed)
[facility] Emergency Department Provider Note    Event Date/Time   First MD Initiated Contact with Patient 06/03/21 1551     (approximate)  I have reviewed the triage vital signs and the nursing notes.   HISTORY  Chief Complaint Numbness    HPI Kristen Francis is a 75 y.o. female history as listed below presents to the ER for evaluation of right-sided numbness and tingling in her right hand going down her right leg occurred this morning.  Does get dialysis.  She is a Restaurant manager, fast food.  Reportedly had outpatient blood work showed evidence of low hemoglobin and concern for low calcium as well.  States that numbness and tingling has resolved.  She does not receive blood transfusions is not currently on iron supplement.  Denies any melena or hematochezia.  No recent injury or procedure.  Past Medical History:  Diagnosis Date   Anemia    Arthritis    Chronic kidney disease    ESRD   Complication of anesthesia    had procedure and felt incision early 80s   Gout    Hyperlipemia    Hypertension    Macular degeneration, right eye    Stroke [facility])    Family History  Problem Relation Age of Onset   Kidney disease Mother    Breast cancer Neg Hx    Past Surgical History:  Procedure Laterality Date   A/V FISTULAGRAM Left 12/25/2018   Procedure: A/V FISTULAGRAM;  Surgeon: Katha Cabal, MD;  Location: [place] [facility];  Service: Cardiovascular;  Laterality: Left;   ABDOMINAL HYSTERECTOMY     APPENDECTOMY     AV FISTULA PLACEMENT Left 07/12/2018   Procedure: INSERTION OF ARTERIOVENOUS (AV) GORE-TEX GRAFT ARM ( BRACHIAL AXILLARY );  Surgeon: Katha Cabal, MD;  Location: [facility] ORS;  Service: Vascular;  Laterality: Left;   COLONOSCOPY WITH PROPOFOL N/A 01/14/2016   Procedure: COLONOSCOPY WITH PROPOFOL;  Surgeon: Manya Silvas, MD;  Location: [facility] ENDOSCOPY;  Service: Endoscopy;  Laterality: N/A;   TONSILLECTOMY     Patient Active Problem  List   Diagnosis Date Noted   Gastroesophageal reflux disease without esophagitis 12/16/2020   Nausea 12/16/2020   Left foot pain 10/16/2020   Left carotid stenosis 09/06/2020   Multinodular goiter 09/06/2020   Atherosclerosis of aorta ([place]) 08/18/2020   Other fatigue 07/25/2020   Left carotid bruit 07/25/2020   Decreased appetite 07/25/2020   Depression, major, single episode, moderate ([place]) 07/25/2020   Elevated ferritin 07/20/2020   Encounter for general adult medical examination with abnormal findings 12/13/2019   Encounter for screening mammogram for malignant neoplasm of breast 12/13/2019   Dysuria 29/47/6546   Complication from renal dialysis device 12/02/2018   Allergic contact dermatitis, unspecified cause 09/21/2018   Coagulation defect, unspecified ([place]) 09/21/2018   Diarrhea, unspecified 09/21/2018   Fever, unspecified 09/21/2018   Headache, unspecified 09/21/2018   Hypertensive chronic kidney disease with stage 1 through stage 4 chronic kidney disease, or unspecified chronic kidney disease 09/21/2018   Iron deficiency anemia, unspecified 09/21/2018   Moderate protein-calorie malnutrition ([place]) 09/21/2018   Other fluid overload 09/21/2018   Pain, unspecified 09/21/2018   Secondary hyperparathyroidism of renal origin ([place]) 09/21/2018   Shortness of breath 09/21/2018   Type 2 diabetes mellitus with other diabetic kidney complication ([place]) 50/35/4656   Anemia in chronic kidney disease 09/21/2018   Encounter for immunization 09/21/2018   Peripheral vascular disease ([place]) 09/21/2018   Furuncle of vulva 09/10/2018  End stage renal disease ([place]) 07/04/2018   Anemia in chronic renal disease 12/31/2017   Essential hypertension 12/31/2017   Stroke ([place]) 12/31/2017   Anemia 09/17/2017   Monoclonal gammopathy of unknown significance ([facility]) 09/17/2017      Prior to Admission medications   Medication Sig Start Date End Date Taking? Authorizing Provider  allopurinol  (ZYLOPRIM) 100 MG tablet Take 1 tablet (100 mg total) by mouth daily. 02/07/19   Ronnell Freshwater, NP  amLODipine (NORVASC) 10 MG tablet TAKE 1 TABLET(10 MG) BY MOUTH DAILY 05/02/21   Lavera Guise, MD  B Complex-C-Folic Acid (RENAL-VITE) 0.8 MG TABS Take by mouth. 11/22/18   [provider]  carvedilol (COREG) 25 MG tablet Take 25 mg by mouth 2 (two) times daily with a meal.    [provider]  cholecalciferol (VITAMIN D) 1000 units tablet Take 1,000 Units by mouth daily.     [provider]  ferrous sulfate 325 (65 FE) MG tablet Take 325 mg by mouth daily.     [provider]  hydrALAZINE (APRESOLINE) 10 MG tablet Take 1 tablet (10 mg total) by mouth 2 (two) times daily. 05/16/21   McDonough, Si Gaul, PA-C  lidocaine-prilocaine (EMLA) cream APPLY SMALL AMOUNT TO ACCESS SITE (AVF) 3 TIMES A WEEK 1 HOUR BEFORE DIALYSIS. COVER WITH OCCLUSIVE DRESSING (SARAN WRAP) 09/30/18   [provider]  losartan (COZAAR) 50 MG tablet Take 50 mg by mouth daily.    [provider]  Methoxy PEG-Epoetin Beta (MIRCERA IJ) Mircera 05/26/20 07/20/21  [provider]  mirtazapine (REMERON) 7.5 MG tablet Take 1 tablet (7.5 mg total) by mouth at bedtime. 07/01/20   Ronnell Freshwater, NP  omeprazole (PRILOSEC) 40 MG capsule TAKE ONE CAPSULE BY MOUTH DAILY 04/08/21   Lavera Guise, MD  promethazine (PHENERGAN) 12.5 MG tablet Take 1 tablet (12.5 mg total) by mouth every 8 (eight) hours as needed for nausea or vomiting. 12/16/20   Lavera Guise, MD  rosuvastatin (CRESTOR) 5 MG tablet TAKE 1 TABLET BY MOUTH DAILY. TAKE ON DAYS NOT HAVING DIALYSIS. 04/01/21   Lavera Guise, MD  triamcinolone cream (KENALOG) 0.1 % Apply 1 application topically 2 (two) times daily. 05/16/21   McDonough, Si Gaul, PA-C    Allergies Other, Penicillins, and Sodium bicarbonate    Social History Social History   Tobacco Use   Smoking status: Former    Pack years: 0.00   Smokeless tobacco:  Never  Vaping Use   Vaping Use: Never used  Substance Use Topics   Alcohol use: No   Drug use: No    Review of Systems Patient denies headaches, rhinorrhea, blurry vision, numbness, shortness of breath, chest pain, edema, cough, abdominal pain, nausea, vomiting, diarrhea, dysuria, fevers, rashes or hallucinations unless otherwise stated above in HPI. ____________________________________________   PHYSICAL EXAM:  VITAL SIGNS: Vitals:   06/03/21 1406 06/03/21 1655  BP:  (!) 149/75  Pulse:  83  Resp:  13  Temp: 97.9 F (36.6 C)   SpO2:  99%    Constitutional: Alert and oriented.  Eyes: Conjunctivae are normal.  Head: Atraumatic. Nose: No congestion/rhinnorhea. Mouth/Throat: Mucous membranes are moist.   Neck: No stridor. Painless ROM.  Cardiovascular: Normal rate, regular rhythm. Grossly normal heart sounds.  Good peripheral circulation. Respiratory: Normal respiratory effort.  No retractions. Lungs CTAB. Gastrointestinal: Soft and nontender. No distention. No abdominal bruits. No CVA tenderness. Genitourinary:  Musculoskeletal: No lower extremity tenderness nor edema.  No joint  effusions. Neurologic:  CN- intact.  No facial droop, Normal FNF.  Normal heel to shin.  Sensation intact bilaterally. Normal speech and language. No gross focal neurologic deficits are appreciated. No gait instability. Skin:  Skin is warm, dry and intact. No rash noted. Psychiatric: Mood and affect are normal. Speech and behavior are normal.  ____________________________________________   LABS (all labs ordered are listed, but only abnormal results are displayed)  Results for orders placed or performed during the hospital encounter of 06/03/21 (from the past 24 hour(s))  Protime-INR     Status: None   Collection Time: 06/03/21  2:07 PM  Result Value Ref Range   Prothrombin Time 13.6 11.4 - 15.2 seconds   INR 1.0 0.8 - 1.2  APTT     Status: None   Collection Time: 06/03/21  2:07 PM  Result  Value Ref Range   aPTT 35 24 - 36 seconds  CBC     Status: Abnormal   Collection Time: 06/03/21  2:07 PM  Result Value Ref Range   WBC 7.5 4.0 - 10.5 K/uL   RBC 2.22 (L) 3.87 - 5.11 MIL/uL   Hemoglobin 6.6 (L) 12.0 - 15.0 g/dL   HCT 20.3 (L) 36.0 - 46.0 %   MCV 91.4 80.0 - 100.0 fL   MCH 29.7 26.0 - 34.0 pg   MCHC 32.5 30.0 - 36.0 g/dL   RDW 17.9 (H) 11.5 - 15.5 %   Platelets 204 150 - 400 K/uL   nRBC 0.7 (H) 0.0 - 0.2 %  Differential     Status: None   Collection Time: 06/03/21  2:07 PM  Result Value Ref Range   Neutrophils Relative % 77 %   Neutro Abs 5.8 1.7 - 7.7 K/uL   Lymphocytes Relative 14 %   Lymphs Abs 1.0 0.7 - 4.0 K/uL   Monocytes Relative 6 %   Monocytes Absolute 0.5 0.1 - 1.0 K/uL   Eosinophils Relative 2 %   Eosinophils Absolute 0.1 0.0 - 0.5 K/uL   Basophils Relative 0 %   Basophils Absolute 0.0 0.0 - 0.1 K/uL   Immature Granulocytes 1 %   Abs Immature Granulocytes 0.05 0.00 - 0.07 K/uL  Comprehensive metabolic panel     Status: Abnormal   Collection Time: 06/03/21  2:07 PM  Result Value Ref Range   Sodium 139 135 - 145 mmol/L   Potassium 3.4 (L) 3.5 - 5.1 mmol/L   Chloride 100 98 - 111 mmol/L   CO2 32 22 - 32 mmol/L   Glucose, Bld 136 (H) 70 - 99 mg/dL   BUN 21 8 - 23 mg/dL   Creatinine, Ser 3.71 (H) 0.44 - 1.00 mg/dL   Calcium 8.4 (L) 8.9 - 10.3 mg/dL   Total Protein 6.9 6.5 - 8.1 g/dL   Albumin 3.7 3.5 - 5.0 g/dL   AST 19 15 - 41 U/L   ALT 12 0 - 44 U/L   Alkaline Phosphatase 46 38 - 126 U/L   Total Bilirubin 0.5 0.3 - 1.2 mg/dL   GFR, Estimated 12 (L) >60 mL/min   Anion gap 7 5 - 15  Type and screen [place]     Status: None   Collection Time: 06/03/21  2:07 PM  Result Value Ref Range   ABO/RH(D) A POS    Antibody Screen NEG    Sample Expiration      06/06/2021,2359 Performed at [facility], [address], [place], [place] [address]   Folate  Status: None   Collection Time: 06/03/21  4:25 PM  Result  Value Ref Range   Folate 11.6 >5.9 ng/mL  Iron and TIBC     Status: Abnormal   Collection Time: 06/03/21  4:25 PM  Result Value Ref Range   Iron 38 28 - 170 ug/dL   TIBC 217 (L) 250 - 450 ug/dL   Saturation Ratios 18 10.4 - 31.8 %   UIBC 179 ug/dL  Ferritin     Status: Abnormal   Collection Time: 06/03/21  4:25 PM  Result Value Ref Range   Ferritin 1,051 (H) 11 - 307 ng/mL  Reticulocytes     Status: Abnormal   Collection Time: 06/03/21  4:25 PM  Result Value Ref Range   Retic Ct Pct 3.8 (H) 0.4 - 3.1 %   RBC. 1.97 (L) 3.87 - 5.11 MIL/uL   Retic Count, Absolute 75.3 19.0 - 186.0 K/uL   Immature Retic Fract 31.1 (H) 2.3 - 15.9 %   ____________________________________________  EKG My review and personal interpretation at Time: 16:19   Indication: numbness  Rate: 85  Rhythm: sinus Axis: normal Other: normal intervals, nonspecifist t wave abn ____________________________________________  RADIOLOGY  I personally reviewed all radiographic images ordered to evaluate for the above acute complaints and reviewed radiology reports and findings.  These findings were personally discussed with the patient.  Please see medical record for radiology report.  ____________________________________________   PROCEDURES  Procedure(s) performed:  Procedures    Critical Care performed: no ____________________________________________   INITIAL IMPRESSION / ASSESSMENT AND PLAN / ED COURSE  Pertinent labs & imaging results that were available during my care of the patient were reviewed by me and considered in my medical decision making (see chart for details).   DDX: cva, tia, hypoglycemia, dehydration, electrolyte abnormality, dissection, sepsis   Camry Robello Nantz is a 75 y.o. who presents to the ED with presentation as described above.  Symptoms concerning for TIA patient also noted to have new anemia which may be causing her to be symptomatic from previous CVA.  CT head shows area of  previous infarct however for MRI recommended to evaluate for for possible acute event.  This also may simply be secondary to her symptomatic anemia.  Denies any chest pain or pressure.  Will check iron studies patient will not receive blood transfusion due to her religion.  Clinical Course as of 06/03/21 1829  Ludwig Clarks Jun 03, 2021  2426 Further review patient's hemoglobin dropped from 10.   Denies any melena.  Given her history and presentation will discuss with hospitalist for admission for TIA work-up.  MRI is pending. [PR]    Clinical Course User Index [PR] Merlyn Lot, MD    The patient was evaluated in Emergency Department today for the symptoms described in the history of present illness. He/she was evaluated in the context of the global COVID-19 pandemic, which necessitated consideration that the patient might be at risk for infection with the SARS-CoV-2 virus that causes COVID-19. Institutional protocols and algorithms that pertain to the evaluation of patients at risk for COVID-19 are in a state of rapid change based on information released by regulatory bodies including the CDC and federal and state organizations. These policies and algorithms were followed during the patient's care in the ED.  As part of my medical decision making, I reviewed the following data within the [place] notes reviewed and incorporated, Labs reviewed, notes from prior ED visits and [place] Controlled Substance Database  ____________________________________________   FINAL CLINICAL IMPRESSION(S) / ED DIAGNOSES  Final diagnoses:  Numbness  Paresthesia  Anemia, unspecified type      NEW MEDICATIONS STARTED DURING THIS VISIT:  New Prescriptions   No medications on file     Note:  This document was prepared using Dragon voice recognition software and may include unintentional dictation errors.    Merlyn Lot, MD 06/03/21 [phone]

## 2021-06-03 NOTE — H&P (Signed)
History and Physical   Kristen Francis VHQ:[id number] DOB: [birth year] DOA: 06/03/2021  Referring MD/NP/PA: Dr. Quentin Cornwall  PCP: Lavera Guise, MD   Outpatient Specialists: Nephrology  Patient coming from: Home  Chief Complaint: Right upper extremity tingling  HPI: Kristen Francis is a 75 y.o. female with medical history significant of end-stage renal disease on hemodialysis Mondays Wednesdays and Fridays, previous CVA in 2020 involving the right side of the body, essential hypertension, diabetes, anemia of chronic disease, gout, essential hypertension and hyperlipidemia who came to the ER secondary to tingling in her right hand and upper extremity.  It is the same size she had a stroke about 2 years ago.  She has fully recovered from that.  She was at dialysis today when she started experiencing that.  Also she was feeling weak all over.  Patient came to the ER where she was evaluated.  Initial head CT was negative.  Patient however was found to be profoundly anemic hemoglobin down to 6.6.  Patient is a Restaurant manager, fast food.  She would not take any blood transfusion.  Her ferritin is high and folate is high appears to be anemia due to chronic disease and not iron deficiency.  She is being admitted for observation and TIA work-up..  ED Course: Temperature 97.9 blood pressure 154/78, pulse 89, respiratory rate of 17 oxygen sat 98% room air.  White count 7.5 hemoglobin 6.6 and platelets 204.  Sodium 139 potassium 3.4 chloride 100 CO2 32 BUN 20 creatinine 3.71 calcium 8.4.  Anemia studies showed ferritin of 1051 with saturation of 18 TIBC 217.  Influenza and COVID-19 screen are negative.  INR 1.0.  Stool guaiac negative.  Patient being admitted for further evaluation and treatment of TIA.  Head CT without contrast negative.  Review of Systems: As per HPI otherwise 10 point review of systems negative.    Past Medical History:  Diagnosis Date   Anemia    Arthritis    Chronic kidney disease     ESRD   Complication of anesthesia    had procedure and felt incision early 80s   Gout    Hyperlipemia    Hypertension    Macular degeneration, right eye    Stroke [facility])     Past Surgical History:  Procedure Laterality Date   A/V FISTULAGRAM Left 12/25/2018   Procedure: A/V FISTULAGRAM;  Surgeon: Katha Cabal, MD;  Location: [facility];  Service: Cardiovascular;  Laterality: Left;   ABDOMINAL HYSTERECTOMY     APPENDECTOMY     AV FISTULA PLACEMENT Left 07/12/2018   Procedure: INSERTION OF ARTERIOVENOUS (AV) GORE-TEX GRAFT ARM ( BRACHIAL AXILLARY );  Surgeon: Katha Cabal, MD;  Location: [facility] ORS;  Service: Vascular;  Laterality: Left;   COLONOSCOPY WITH PROPOFOL N/A 01/14/2016   Procedure: COLONOSCOPY WITH PROPOFOL;  Surgeon: Manya Silvas, MD;  Location: [facility] END[facility]COPY;  Service: Endoscopy;  Laterality: N/A;   TONSILLECTOMY       reports that she has quit smoking. She has never used smokeless tobacco. She reports that she does not drink alcohol and does not use drugs.  Allergies  Allergen Reactions   Other Other (See Comments)    Pt does not receive Blood    Penicillins Hives    Has patient had a PCN reaction causing immediate rash, facial/tongue/throat swelling, SOB or lightheadedness with hypotension: YES Has patient had a PCN reaction causing severe rash involving mucus membranes or skin necrosis: no Has patient had a PCN  reaction that required hospitalization: no Has patient had a PCN reaction occurring within the last 10 years: no If all of the above answers are "NO", then may proceed with Cephalosporin use.    Sodium Bicarbonate Itching    Family History  Problem Relation Age of Onset   Kidney disease Mother    Breast cancer Neg Hx      Prior to Admission medications   Medication Sig Start Date End Date Taking? Authorizing Provider  amLODipine (NORVASC) 10 MG tablet TAKE 1 TABLET(10 MG) BY MOUTH DAILY 05/02/21  Yes Lavera Guise, MD  B  Complex-C-Folic Acid (RENAL-VITE) 0.8 MG TABS Take 0.8 mg by mouth every evening. On dialysis days 11/22/18  Yes [provider]  carvedilol (COREG) 25 MG tablet Take 25 mg by mouth 2 (two) times daily with a meal.   Yes [provider]  cinacalcet (SENSIPAR) 60 MG tablet Take 60 mg by mouth every evening.   Yes [provider]  hydrALAZINE (APRESOLINE) 10 MG tablet Take 1 tablet (10 mg total) by mouth 2 (two) times daily. 05/16/21  Yes McDonough, Lauren K, PA-C  lidocaine-prilocaine (EMLA) cream APPLY SMALL AMOUNT TO ACCESS SITE (AVF) 3 TIMES A WEEK 1 HOUR BEFORE DIALYSIS. COVER WITH OCCLUSIVE DRESSING (SARAN WRAP) 09/30/18  Yes [provider]  omeprazole (PRILOSEC) 40 MG capsule TAKE ONE CAPSULE BY MOUTH DAILY 04/08/21  Yes Lavera Guise, MD  rosuvastatin (CRESTOR) 5 MG tablet TAKE 1 TABLET BY MOUTH DAILY. TAKE ON DAYS NOT HAVING DIALYSIS. 04/01/21  Yes Lavera Guise, MD  triamcinolone cream (KENALOG) 0.1 % Apply 1 application topically 2 (two) times daily. 05/16/21  Yes McDonough, Lauren K, PA-C  allopurinol (ZYLOPRIM) 100 MG tablet Take 1 tablet (100 mg total) by mouth daily. 02/07/19   Ronnell Freshwater, NP  cholecalciferol (VITAMIN D) 1000 units tablet Take 1,000 Units by mouth daily.     [provider]  ferrous sulfate 325 (65 FE) MG tablet Take 325 mg by mouth daily.     [provider]  losartan (COZAAR) 50 MG tablet Take 50 mg by mouth daily.    [provider]  Methoxy PEG-Epoetin Beta (MIRCERA IJ) Mircera 05/26/20 07/20/21  [provider]  mirtazapine (REMERON) 7.5 MG tablet Take 1 tablet (7.5 mg total) by mouth at bedtime. Patient not taking: Reported on 06/03/2021 07/01/20   Ronnell Freshwater, NP  promethazine (PHENERGAN) 12.5 MG tablet Take 1 tablet (12.5 mg total) by mouth every 8 (eight) hours as needed for nausea or vomiting. Patient not taking: No sig reported 12/16/20   Lavera Guise, MD    Physical Exam: Vitals:    06/03/21 1401 06/03/21 1406 06/03/21 1655  BP: (!) 154/78  (!) 149/75  Pulse: 89  83  Resp: 17  13  Temp:  97.9 F (36.6 C)   TempSrc: Oral    SpO2: 98%  99%  Weight: 54.4 kg    Height: 5\' 3"  (1.6 m)        Constitutional: Frail, acutely ill looking no distress Vitals:   06/03/21 1401 06/03/21 1406 06/03/21 1655  BP: (!) 154/78  (!) 149/75  Pulse: 89  83  Resp: 17  13  Temp:  97.9 F (36.6 C)   TempSrc: Oral    SpO2: 98%  99%  Weight: 54.4 kg    Height: 5\' 3"  (1.6 m)     Eyes: PERRL, lids and conjunctivae normal ENMT: Mucous membranes are moist. Posterior pharynx clear  of any exudate or lesions.Normal dentition.  Neck: normal, supple, no masses, no thyromegaly Respiratory: clear to auscultation bilaterally, no wheezing, no crackles. Normal respiratory effort. No accessory muscle use.  Cardiovascular: Regular rate and rhythm, no murmurs / rubs / gallops. No extremity edema. 2+ pedal pulses. No carotid bruits.  Abdomen: no tenderness, no masses palpated. No hepatosplenomegaly. Bowel sounds positive.  Musculoskeletal: no clubbing / cyanosis. No joint deformity upper and lower extremities. Good ROM, no contractures. Normal muscle tone.  Skin: no rashes, lesions, ulcers. No induration Neurologic: CN 2-12 grossly intact. Sensation intact, DTR normal. Strength 5/5 in all 4.  Psychiatric: Normal judgment and insight. Alert and oriented x 3. Normal mood.     Labs on Admission: I have personally reviewed following labs and imaging studies  CBC: Recent Labs  Lab 06/03/21 1030 06/03/21 1407  WBC  --  7.5  NEUTROABS  --  5.8  HGB 6.3* 6.6*  HCT  --  20.3*  MCV  --  91.4  PLT  --  283   Basic Metabolic Panel: Recent Labs  Lab 06/03/21 1407  NA 139  K 3.4*  CL 100  CO2 32  GLUCOSE 136*  BUN 21  CREATININE 3.71*  CALCIUM 8.4*   GFR: Estimated Creatinine Clearance: 11 mL/min (A) (by C-G formula based on SCr of 3.71 mg/dL (H)). Liver Function Tests: Recent Labs   Lab 06/03/21 1407  AST 19  ALT 12  ALKPHOS 46  BILITOT 0.5  PROT 6.9  ALBUMIN 3.7   No results for input(s): LIPASE, AMYLASE in the last 168 hours. No results for input(s): AMMONIA in the last 168 hours. Coagulation Profile: Recent Labs  Lab 06/03/21 1407  INR 1.0   Cardiac Enzymes: No results for input(s): CKTOTAL, CKMB, CKMBINDEX, TROPONINI in the last 168 hours. BNP (last 3 results) No results for input(s): PROBNP in the last 8760 hours. HbA1C: No results for input(s): HGBA1C in the last 72 hours. CBG: No results for input(s): GLUCAP in the last 168 hours. Lipid Profile: No results for input(s): CHOL, HDL, LDLCALC, TRIG, CHOLHDL, LDLDIRECT in the last 72 hours. Thyroid Function Tests: No results for input(s): TSH, T4TOTAL, FREET4, T3FREE, THYROIDAB in the last 72 hours. Anemia Panel: Recent Labs    06/03/21 1625  FOLATE 11.6  FERRITIN 1,051*  TIBC 217*  IRON 38  RETICCTPCT 3.8*   Urine analysis:    Component Value Date/Time   COLORURINE STRAW (A) 10/08/2016 1959   APPEARANCEUR Clear 12/09/2019 1649   LABSPEC 1.013 10/08/2016 1959   PHURINE 6.0 10/08/2016 1959   GLUCOSEU CANCELED 01/24/2021 1400   HGBUR NEGATIVE 10/08/2016 1959   BILIRUBINUR Negative 12/09/2019 1649   KETONESUR NEGATIVE 10/08/2016 1959   PROTEINUR CANCELED 01/24/2021 1400   PROTEINUR 100 (A) 10/08/2016 1959   NITRITE Negative 12/09/2019 1649   NITRITE NEGATIVE 10/08/2016 1959   LEUKOCYTESUR Negative 12/09/2019 1649   Sepsis Labs: @LABRCNTIP (procalcitonin:4,lacticidven:4) )No results found for this or any previous visit (from the past 240 hour(s)).   Radiological Exams on Admission: CT HEAD WO CONTRAST  Result Date: 06/03/2021 CLINICAL DATA:  Right hand weakness and tingling for 3 days, stroke suspected EXAM: CT HEAD WITHOUT CONTRAST TECHNIQUE: Contiguous axial images were obtained from the base of the skull through the vertex without intravenous contrast. COMPARISON:  08/23/2018  FINDINGS: Brain: No evidence of acute infarction, hemorrhage, hydrocephalus, extra-axial collection or mass lesion/mass effect. Periventricular and deep white matter hypodensity. Lacunar infarction of the right basal ganglia (series 2, image 15).  Vascular: No hyperdense vessel or unexpected calcification. Skull: Normal. Negative for fracture or focal lesion. Sinuses/Orbits: No acute finding. Other: None. IMPRESSION: No acute intracranial pathology. Small-vessel white matter disease and lacunar infarction of the right basal ganglia. Consider MRI to more sensitively evaluate for acute diffusion restricting infarction if suspected. Electronically Signed   By: Eddie Candle M.D.   On: 06/03/2021 15:18    EKG: Independently reviewed.  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  We will admit the patient.  MRI of the brain, carotid Dopplers, echocardiogram.  Patient to be maintained on aspirin and statin.  #2 symptomatic anemia: Patient has a normocytic anemia indicating chronic disease.  She is however a [employer] and cannot take blood.  Will defer to nephrology for iron as possible iron infusion.  #3 type 2 diabetes: Non-insulin-dependent.  Sliding scale insulin  #4 end-stage renal disease: On hemodialysis Mondays Wednesdays and Fridays.  Patient was dialyzed today.  #5 essential hypertension: Patient takes carvedilol, hydralazine, as well as amlodipine.  We will order  #6 moderate protein calorie malnutrition: Encourage oral intake.   DVT prophylaxis: Heparin Code Status: Full  code Family Communication: No family at bedside Disposition Plan: Home Consults called: None but needs nephrology follow-up. Admission status: Observation  Severity of Illness: The appropriate patient status for this patient is OBSERVATION. Observation status is judged to be reasonable and necessary in order to provide the required intensity of service to ensure the patient's safety. The patient's presenting symptoms, physical exam findings, and initial radiographic and laboratory data in the context of their medical condition is felt to place them at decreased risk for further clinical deterioration. Furthermore, it is anticipated that the patient will be medically stable for discharge from the hospital within 2 midnights of admission. The following factors support the patient status of observation.   " The patient's presenting symptoms include tingling and numbness of right upper extremity. " The physical exam findings include no significant findings. " The initial radiographic and laboratory data are negative head CT.   Barbette Merino MD [facility] Pager [phone]  If 7PM-7AM, please contact night-coverage [email or web address] Password [email or web address]  06/03/2021, 7:06 PM

## 2021-06-03 NOTE — ED Notes (Signed)
Pt c/o intermittent tingling in right arm and leg since Wednesday. Pt states only right arm in tingling today. Denies numbness. See [facility] of 1. A+Ox4. Stable gait. Denies pain.

## 2021-06-04 ENCOUNTER — Observation Stay: Payer: Medicare Other

## 2021-06-04 DIAGNOSIS — E041 Nontoxic single thyroid nodule: Secondary | ICD-10-CM | POA: Diagnosis not present

## 2021-06-04 DIAGNOSIS — I771 Stricture of artery: Secondary | ICD-10-CM | POA: Diagnosis not present

## 2021-06-04 DIAGNOSIS — I6523 Occlusion and stenosis of bilateral carotid arteries: Secondary | ICD-10-CM | POA: Diagnosis not present

## 2021-06-04 DIAGNOSIS — Z8673 Personal history of transient ischemic attack (TIA), and cerebral infarction without residual deficits: Secondary | ICD-10-CM | POA: Diagnosis not present

## 2021-06-04 DIAGNOSIS — I10 Essential (primary) hypertension: Secondary | ICD-10-CM | POA: Diagnosis not present

## 2021-06-04 DIAGNOSIS — D649 Anemia, unspecified: Secondary | ICD-10-CM | POA: Diagnosis not present

## 2021-06-04 DIAGNOSIS — G459 Transient cerebral ischemic attack, unspecified: Secondary | ICD-10-CM | POA: Diagnosis not present

## 2021-06-04 DIAGNOSIS — R202 Paresthesia of skin: Secondary | ICD-10-CM | POA: Diagnosis not present

## 2021-06-04 LAB — CBC
HCT: 19.1 % — ABNORMAL LOW (ref 36.0–46.0)
Hemoglobin: 6.1 g/dL — ABNORMAL LOW (ref 12.0–15.0)
MCH: 29.3 pg (ref 26.0–34.0)
MCHC: 31.9 g/dL (ref 30.0–36.0)
MCV: 91.8 fL (ref 80.0–100.0)
Platelets: 197 10*3/uL (ref 150–400)
RBC: 2.08 MIL/uL — ABNORMAL LOW (ref 3.87–5.11)
RDW: 17.9 % — ABNORMAL HIGH (ref 11.5–15.5)
WBC: 5.8 10*3/uL (ref 4.0–10.5)
nRBC: 1.2 % — ABNORMAL HIGH (ref 0.0–0.2)

## 2021-06-04 LAB — LIPID PANEL
Cholesterol: 142 mg/dL (ref 0–200)
HDL: 74 mg/dL (ref >40)
LDL Cholesterol: 57 mg/dL (ref 0–99)
Total CHOL/HDL Ratio: 1.9 RATIO
Triglycerides: 55 mg/dL (ref <150)
VLDL: 11 mg/dL (ref 0–40)

## 2021-06-04 LAB — CREATININE, SERUM
Creatinine, Ser: 5.64 mg/dL — ABNORMAL HIGH (ref 0.44–1.00)
GFR, Estimated: 7 mL/min — ABNORMAL LOW (ref >60)

## 2021-06-04 LAB — VITAMIN B12: Vitamin B-12: 419 pg/mL (ref 180–914)

## 2021-06-04 MED ORDER — HEPARIN SODIUM (PORCINE) 5000 UNIT/ML IJ SOLN
5000.0000 [IU] | Freq: Three times a day (TID) | INTRAMUSCULAR | Status: DC
  Administered 2021-06-04: 5000 [IU] via SUBCUTANEOUS
  Filled 2021-06-04: qty 1

## 2021-06-04 MED ORDER — ACETAMINOPHEN 325 MG PO TABS: 650.0000 mg | ORAL_TABLET | ORAL | Status: DC | PRN

## 2021-06-04 MED ORDER — POTASSIUM CHLORIDE 20 MEQ PO PACK
20.0000 meq | PACK | Freq: Once | ORAL | Status: AC
  Administered 2021-06-04: 20 meq via ORAL
  Filled 2021-06-04: qty 1

## 2021-06-04 MED ORDER — ASPIRIN 300 MG RE SUPP: 300.0000 mg | Freq: Every day | RECTAL | Status: DC

## 2021-06-04 MED ORDER — FERROUS SULFATE 325 (65 FE) MG PO TABS: 325.0000 mg | ORAL_TABLET | Freq: Two times a day (BID) | ORAL | Status: DC

## 2021-06-04 MED ORDER — ACETAMINOPHEN 650 MG RE SUPP: 650.0000 mg | RECTAL | Status: DC | PRN

## 2021-06-04 MED ORDER — STROKE: EARLY STAGES OF RECOVERY BOOK: Freq: Once | Status: DC

## 2021-06-04 MED ORDER — SENNOSIDES-DOCUSATE SODIUM 8.6-50 MG PO TABS: 1.0000 | ORAL_TABLET | Freq: Every evening | ORAL | Status: DC | PRN

## 2021-06-04 MED ORDER — INSULIN ASPART 100 UNIT/ML IJ SOLN: 0.0000 [IU] | Freq: Three times a day (TID) | INTRAMUSCULAR | Status: DC

## 2021-06-04 MED ORDER — FERROUS SULFATE 325 (65 FE) MG PO TABS: 325.0000 mg | ORAL_TABLET | Freq: Two times a day (BID) | ORAL | 2 refills | Status: DC

## 2021-06-04 MED ORDER — ASPIRIN 325 MG PO TABS
325.0000 mg | ORAL_TABLET | Freq: Every day | ORAL | Status: DC
  Administered 2021-06-04: 325 mg via ORAL
  Filled 2021-06-04 (×2): qty 1

## 2021-06-04 MED ORDER — ASPIRIN EC 81 MG PO TBEC: 81.0000 mg | DELAYED_RELEASE_TABLET | Freq: Every day | ORAL | Status: DC

## 2021-06-04 MED ORDER — ASPIRIN 81 MG PO TBEC: 81.0000 mg | DELAYED_RELEASE_TABLET | Freq: Every day | ORAL | 0 refills | Status: AC

## 2021-06-04 MED ORDER — ACETAMINOPHEN 160 MG/5ML PO SOLN
650.0000 mg | ORAL | Status: DC | PRN
  Filled 2021-06-04: qty 20.3

## 2021-06-04 MED ORDER — INSULIN ASPART 100 UNIT/ML IJ SOLN: 0.0000 [IU] | Freq: Every day | INTRAMUSCULAR | Status: DC

## 2021-06-04 NOTE — Progress Notes (Signed)
SLP Cancellation Note  Patient Details Name: Kristen Francis MRN: [id number] DOB: 02-Dec-1946   Cancelled treatment:       Reason Eval/Treat Not Completed: SLP screened, no needs identified, will sign off (chart reviewed; consulted NSG then met w/ pt).  Pt denied any difficulty swallowing and is currently on a regular diet; tolerates swallowing pills w/ water per NSG. Pt stated she "loved" the breakfast meal and "ate it all". She endorsed not eating well at home moreso on HD days. Recommended she talk to NSG/MD/Dietician about a nutritional supplement, Nepro possibly. Pt conversed at conversational level w/out deficits noted; pt and NSG denied any speech-language deficits.  No further skilled ST services indicated as pt appears at her baseline. Pt agreed. NSG to reconsult if any change in status.       Orinda Kenner, MS, CCC-SLP Speech Language Pathologist Rehab Services [phone] [facility] 06/04/2021, 10:23 AM

## 2021-06-04 NOTE — Discharge Summary (Signed)
[place] [facility] [place] NAME: Kristen Francis    MR#:  [id number]  DATE OF BIRTH:  [birth year]  DATE OF ADMISSION:  06/03/2021 ADMITTING PHYSICIAN: Elwyn Reach, MD  DATE OF DISCHARGE: 06/04/2021  PRIMARY CARE PHYSICIAN: Lavera Guise, MD    ADMISSION DIAGNOSIS:  TIA (transient ischemic attack) [G45.9]  DISCHARGE DIAGNOSIS:  Tingling and numbness suspected due to Anemia TIA SECONDARY DIAGNOSIS:   Past Medical History:  Diagnosis Date   Anemia    Arthritis    Chronic kidney disease    ESRD   Complication of anesthesia    had procedure and felt incision early 80s   Gout    Hyperlipemia    Hypertension    Macular degeneration, right eye    Stroke [facility])     HOSPITAL COURSE:  Kristen Francis is a 75 y.o. female with medical history significant of end-stage renal disease on hemodialysis Mondays Wednesdays and Fridays, previous CVA in 2020 involving the right side of the body, essential hypertension, diabetes, anemia of chronic disease, gout, essential hypertension and hyperlipidemia who came to the ER secondary to tingling in her right hand and upper extremity.  #1 TIA:  Patient has TIA on the same side that she had a previous stroke.   --  MRI of the brain negative -- carotid Dopplers-- no significant stenosis -- on aspirin and statin. --PT-OT--no needs. Did well   #2 symptomatic anemia: with tingling and weakness --Patient has a normocytic anemia indicating chronic disease.  -- She is however a Jehov[employer]'s [employer] and cannot take blood.  -- patient offered to start IV iron infusion. She wants her defer it as outpatient at the cancer center like before. I've asked her to reach out to her PCP and get that set up. -- continue ferrous sulfate two times a day. -- patient denies any black colored stools or blood he stools. Denies hematemesis.   #3 type 2 diabetes: Non-insulin-dependent.  Sliding scale insulin   #4 end-stage renal  disease: On hemodialysis Mondays Wednesdays and Fridays.  Patient was dialyzed for about 40 minutes on Friday according to her. -- case discussed with Dr. Zollie Scale nephrology. He has informed patient's primary nephrology Dr. Trinda Pascal. Dr. were aware patient is wanting to go home and will follow her up at dialysis. She'll be resumed resumed on her mircera.   #5 essential hypertension: Patient takes carvedilol, hydralazine, as well as amlodipine.     #6 moderate protein calorie malnutrition: Encourage oral intake.  patient is insisting she would like to go home. She is otherwise hemodynamically stable. She will follow-up with primary care in her [facility] nephrologist Dr. Julianne Rice workup.     DVT prophylaxis: Heparin Code Status: Full code Family Communication: No family at bedside Disposition Plan: Home Consults called: None but needs nephrology follow-up. Admission status: Observation   CONSULTS OBTAINED:  Treatment Team:  Anthonette Legato, MD  DRUG ALLERGIES:   Allergies  Allergen Reactions   Other Other (See Comments)    Pt does not receive Blood    Penicillins Hives    Has patient had a PCN reaction causing immediate rash, facial/tongue/throat swelling, SOB or lightheadedness with hypotension: YES Has patient had a PCN reaction causing severe rash involving mucus membranes or skin necrosis: no Has patient had a PCN reaction that required hospitalization: no Has patient had a PCN reaction occurring within the last 10 years: no If all of the above answers are "NO", then may  proceed with Cephalosporin use.    Sodium Bicarbonate Itching    DISCHARGE MEDICATIONS:   Allergies as of 06/04/2021       Reactions   Other Other (See Comments)   Pt does not receive Blood    Penicillins Hives   Has patient had a PCN reaction causing immediate rash, facial/tongue/throat swelling, SOB or lightheadedness with hypotension: YES Has patient had a PCN reaction causing severe rash involving mucus  membranes or skin necrosis: no Has patient had a PCN reaction that required hospitalization: no Has patient had a PCN reaction occurring within the last 10 years: no If all of the above answers are "NO", then may proceed with Cephalosporin use.   Sodium Bicarbonate Itching        Medication List     STOP taking these medications    mirtazapine 7.5 MG tablet Commonly known as: REMERON   promethazine 12.5 MG tablet Commonly known as: PHENERGAN       TAKE these medications    allopurinol 100 MG tablet Commonly known as: ZYLOPRIM Take 1 tablet (100 mg total) by mouth daily.   amLODipine 10 MG tablet Commonly known as: NORVASC TAKE 1 TABLET(10 MG) BY MOUTH DAILY   aspirin 81 MG EC tablet Take 1 tablet (81 mg total) by mouth daily. Swallow whole. Start taking on: June 05, 2021   carvedilol 25 MG tablet Commonly known as: COREG Take 25 mg by mouth 2 (two) times daily with a meal.   cholecalciferol 1000 units tablet Commonly known as: VITAMIN D Take 1,000 Units by mouth daily.   cinacalcet 60 MG tablet Commonly known as: SENSIPAR Take 60 mg by mouth every evening.   ferrous sulfate 325 (65 FE) MG tablet Take 1 tablet (325 mg total) by mouth 2 (two) times daily with a meal. What changed: when to take this   hydrALAZINE 10 MG tablet Commonly known as: APRESOLINE Take 1 tablet (10 mg total) by mouth 2 (two) times daily.   lidocaine-prilocaine cream Commonly known as: EMLA APPLY SMALL AMOUNT TO ACCESS SITE (AVF) 3 TIMES A WEEK 1 HOUR BEFORE DIALYSIS. COVER WITH OCCLUSIVE DRESSING (SARAN WRAP)   losartan 50 MG tablet Commonly known as: COZAAR Take 50 mg by mouth daily.   MIRCERA IJ Mircera   omeprazole 40 MG capsule Commonly known as: PRILOSEC TAKE ONE CAPSULE BY MOUTH DAILY   Renal-Vite 0.8 MG Tabs Take 0.8 mg by mouth every evening. On dialysis days   rosuvastatin 5 MG tablet Commonly known as: CRESTOR TAKE 1 TABLET BY MOUTH DAILY. TAKE ON DAYS NOT  HAVING DIALYSIS.   triamcinolone cream 0.1 % Commonly known as: KENALOG Apply 1 application topically 2 (two) times daily.        If you experience worsening of your admission symptoms, develop shortness of breath, life threatening emergency, suicidal or homicidal thoughts you must seek medical attention immediately by calling 911 or calling your MD immediately  if symptoms less severe.  You Must read complete instructions/literature along with all the possible adverse reactions/side effects for all the Medicines you take and that have been prescribed to you. Take any new Medicines after you have completely understood and accept all the possible adverse reactions/side effects.   Please note  You were cared for by a hospitalist during your hospital stay. If you have any questions about your discharge medications or the care you received while you were in the hospital after you are discharged, you can call the unit and asked to  speak with the hospitalist on call if the hospitalist that took care of you is not available. Once you are discharged, your primary care physician will handle any further medical issues. Please note that NO REFILLS for any discharge medications will be authorized once you are discharged, as it is imperative that you return to your primary care physician (or establish a relationship with a primary care physician if you do not have one) for your aftercare needs so that they can reassess your need for medications and monitor your lab values. Today   SUBJECTIVE  feeling overall well. Work with physical therapy did well. PO diet tolerated. Worked with physical therapy and speech therapy no deficits noted. Patient is requesting to go home.   VITAL SIGNS:  Blood pressure 135/73, pulse 85, temperature 98.9 F (37.2 C), temperature source Oral, resp. rate 20, height 5\' 3"  (1.6 m), weight 54.4 kg, SpO2 99 %.  I/O:   Intake/Output Summary (Last 24 hours) at 06/04/2021 1401 Last  data filed at 06/04/2021 0600 Gross per 24 hour  Intake --  Output 200 ml  Net -200 ml    PHYSICAL EXAMINATION:  GENERAL:  75 y.o.-year-old patient lying in the bed with no acute distress.  Pallor+ LUNGS: Normal breath sounds bilaterally, no wheezing, rales,rhonchi or crepitation. No use of accessory muscles of respiration.  CARDIOVASCULAR: S1, S2 normal. No murmurs, rubs, or gallops.  ABDOMEN: Soft, non-tender, non-distended. Bowel sounds present. No organomegaly or mass.  EXTREMITIES: No pedal edema, cyanosis, or clubbing.  NEUROLOGIC: Cranial nerves II through XII are intact. Muscle strength 5/5 in all extremities. Sensation intact. Gait not checked.  PSYCHIATRIC: The patient is alert and oriented x 3.  SKIN: No obvious rash, lesion, or ulcer.   DATA REVIEW:   CBC  Recent Labs  Lab 06/04/21 0739  WBC 5.8  HGB 6.1*  HCT 19.1*  PLT 197    Chemistries  Recent Labs  Lab 06/03/21 1407 06/04/21 0739  NA 139  --   K 3.4*  --   CL 100  --   CO2 32  --   GLUCOSE 136*  --   BUN 21  --   CREATININE 3.71* 5.64*  CALCIUM 8.4*  --   AST 19  --   ALT 12  --   ALKPHOS 46  --   BILITOT 0.5  --     Microbiology Results   Recent Results (from the past 240 hour(s))  Resp Panel by RT-PCR (Flu A&B, Covid) Nasopharyngeal Swab     Status: None   Collection Time: 06/03/21  6:24 PM   Specimen: Nasopharyngeal Swab; Nasopharyngeal(NP) swabs in vial transport medium  Result Value Ref Range Status   SARS Coronavirus 2 by RT PCR NEGATIVE NEGATIVE Final    Comment: (NOTE) SARS-CoV-2 target nucleic acids are NOT DETECTED.  The SARS-CoV-2 RNA is generally detectable in upper respiratory specimens during the acute phase of infection. The lowest concentration of SARS-CoV-2 viral copies this assay can detect is 138 copies/mL. A negative result does not preclude SARS-Cov-2 infection and should not be used as the sole basis for treatment or other patient management decisions. A negative  result may occur with  improper specimen collection/handling, submission of specimen other than nasopharyngeal swab, presence of viral mutation(s) within the areas targeted by this assay, and inadequate number of viral copies(<138 copies/mL). A negative result must be combined with clinical observations, patient history, and epidemiological information. The expected result is Negative.  Fact Sheet for Patients:  EntrepreneurPulse.com.au  Fact Sheet for Healthcare Providers:  IncredibleEmployment.be  This test is no t yet approved or cleared by the [place] FDA and  has been authorized for detection and/or diagnosis of SARS-CoV-2 by FDA under an Emergency Use Authorization (EUA). This EUA will remain  in effect (meaning this test can be used) for the duration of the COVID-19 declaration under Section 564(b)(1) of the Act, 21 U.S.C.section 360bbb-3(b)(1), unless the authorization is terminated  or revoked sooner.       Influenza A by PCR NEGATIVE NEGATIVE Final   Influenza B by PCR NEGATIVE NEGATIVE Final    Comment: (NOTE) The Xpert Xpress SARS-CoV-2/FLU/RSV plus assay is intended as an aid in the diagnosis of influenza from Nasopharyngeal swab specimens and should not be used as a sole basis for treatment. Nasal washings and aspirates are unacceptable for Xpert Xpress SARS-CoV-2/FLU/RSV testing.  Fact Sheet for Patients: EntrepreneurPulse.com.au  Fact Sheet for Healthcare Providers: IncredibleEmployment.be  This test is not yet approved or cleared by the [place] FDA and has been authorized for detection and/or diagnosis of SARS-CoV-2 by FDA under an Emergency Use Authorization (EUA). This EUA will remain in effect (meaning this test can be used) for the duration of the COVID-19 declaration under Section 564(b)(1) of the Act, 21 U.S.C. section 360bbb-3(b)(1), unless the authorization is  terminated or revoked.  Performed at [facility], [address] [place], [place] [address]     RADIOLOGY:  CT HEAD WO CONTRAST  Result Date: 06/03/2021 CLINICAL DATA:  Right hand weakness and tingling for 3 days, stroke suspected EXAM: CT HEAD WITHOUT CONTRAST TECHNIQUE: Contiguous axial images were obtained from the base of the skull through the vertex without intravenous contrast. COMPARISON:  08/23/2018 FINDINGS: Brain: No evidence of acute infarction, hemorrhage, hydrocephalus, extra-axial collection or mass lesion/mass effect. Periventricular and deep white matter hypodensity. Lacunar infarction of the right basal ganglia (series 2, image 15). Vascular: No hyperdense vessel or unexpected calcification. Skull: Normal. Negative for fracture or focal lesion. Sinuses/Orbits: No acute finding. Other: None. IMPRESSION: No acute intracranial pathology. Small-vessel white matter disease and lacunar infarction of the right basal ganglia. Consider MRI to more sensitively evaluate for acute diffusion restricting infarction if suspected. Electronically Signed   By: Eddie Candle M.D.   On: 06/03/2021 15:18   MR BRAIN WO CONTRAST  Result Date: 06/03/2021 CLINICAL DATA:  Right-sided numbness and tingling EXAM: MRI HEAD WITHOUT CONTRAST TECHNIQUE: Multiplanar, multiecho pulse sequences of the brain and surrounding structures were obtained without intravenous contrast. COMPARISON:  None. FINDINGS: Brain: No acute infarct, mass effect or extra-axial collection. No acute or chronic hemorrhage. There is multifocal hyperintense T2-weighted signal within the white matter. Parenchymal volume and CSF spaces are normal. Old small vessel infarct of the right basal ganglia. The midline structures are normal. Vascular: Major flow voids are preserved. Skull and upper cervical spine: Normal calvarium and skull base. Visualized upper cervical spine and soft tissues are normal. Sinuses/Orbits:No paranasal sinus fluid  levels or advanced mucosal thickening. Small amount of right mastoid fluid. Normal orbits. IMPRESSION: 1. No acute intracranial abnormality. 2. Findings of chronic ischemic microangiopathy. Electronically Signed   By: Ulyses Jarred M.D.   On: 06/03/2021 20:06   US Carotid Bilateral (at [facility] and [facility] only)  Result Date: 06/04/2021 CLINICAL DATA:  Transient ischemic attack. EXAM: BILATERAL CAROTID DUPLEX ULTRASOUND TECHNIQUE: Pearline Cables scale imaging, color Doppler and duplex ultrasound were performed of bilateral carotid and vertebral arteries in the neck. COMPARISON:  None. FINDINGS:  Criteria: Quantification of carotid stenosis is based on velocity parameters that correlate the residual internal carotid diameter with NASCET-based stenosis levels, using the diameter of the distal internal carotid lumen as the denominator for stenosis measurement. The following velocity measurements were obtained: RIGHT ICA: 104/20 cm/sec CCA: 347/42 cm/sec SYSTOLIC ICA/CCA RATIO:  1.1 ECA: 93 cm/sec LEFT ICA: 118/33 cm/sec CCA: 59/56 cm/sec SYSTOLIC ICA/CCA RATIO:  1.1 ECA: 71 cm/sec RIGHT CAROTID ARTERY: Small amount of plaque in the right common carotid artery and bulb. External carotid artery is patent with normal waveform. Normal waveforms and velocities in the internal carotid artery. RIGHT VERTEBRAL ARTERY: Antegrade flow and normal waveform in the right vertebral artery. LEFT CAROTID ARTERY: Proximal left common carotid artery is tortuous which likely accounts for a slightly elevated peak systolic velocity in this area. Mild plaque in the left common carotid artery and bulb. External carotid artery is patent with normal waveform. Small amount of plaque in the proximal internal carotid artery. Normal waveforms and velocities in the internal carotid artery. LEFT VERTEBRAL ARTERY: Antegrade flow and normal waveform in the left vertebral artery. Other: Small bilateral thyroid nodules. Majority of the nodules are cystic or partially  cystic. There is a mixed cystic and solid nodule in the right thyroid lobe measuring up to 1.2 cm. IMPRESSION: 1. Mild atherosclerotic disease involving bilateral carotid arteries. Estimated degree of stenosis in the internal carotid arteries is less than 50% bilaterally. 2. Patent vertebral arteries with antegrade flow. 3. Multiple small bilateral thyroid nodules and cysts. Reportedly, the patient had a previous outside thyroid ultrasound. Recommend referring to that report. Visualized nodules on this examination do not meet criteria for dedicated workup. Electronically Signed   By: Markus Daft M.D.   On: 06/04/2021 11:05     CODE STATUS:     Code Status Orders  (From admission, onward)           Start     Ordered   06/04/21 0105  Full code  Continuous        06/04/21 0104           Code Status History     This patient has a current code status but no historical code status.        TOTAL TIME TAKING CARE OF THIS PATIENT: 40 minutes.    Fritzi Mandes M.D  [facility]    CC: Primary care physician; Lavera Guise, MD

## 2021-06-04 NOTE — Progress Notes (Signed)
OT Cancellation Note  Patient Details Name: [name] MRN: [id number] DOB: 1946-08-06   Cancelled Treatment:    Reason Eval/Treat Not Completed: Patient at procedure or test/ unavailable. Consult received, chart reviewed. Pt noted to be out for imaging. Will re-attempt at later time as pt is available and medically appropriate.   Hanley Hays, MPH, MS, OTR/L ascom [phone] 06/04/21, 8:45 AM

## 2021-06-04 NOTE — Discharge Instructions (Signed)
Resume your hemodialysis Monday Wednesday Friday discussed with your PCP regarding getting you set up with cancer center for IV iron infusions return to the emergency room if sign symptoms worsen

## 2021-06-04 NOTE — ED Notes (Signed)
Pt in doorway of ED room, states physician told her she could be discharged. We messaged the attending provider for clarification and notified the patient of what we are waiting on.

## 2021-06-04 NOTE — Progress Notes (Signed)
PT Cancellation Note  Patient Details Name: Kristen Francis MRN: [id number] DOB: 1946/11/25   Cancelled Treatment:    Reason Eval/Treat Not Completed: PT screened, no needs identified, will sign off. PT orders received, chart reviewed. Spoke with OT who reports pt is ambulatory in the room without assistance. No current acute PT needs at this time. Will sign off. Please re-consult if new needs arise. Thank you.  [name], PT, DPT 06/04/21, 12:18 PM    [name] 06/04/2021, 12:17 PM

## 2021-06-04 NOTE — Evaluation (Signed)
Occupational Therapy Evaluation Patient Details Name: Kristen Francis MRN: [id number] DOB: September 22, 1946 Today's Date: 06/04/2021    History of Present Illness 75 y.o. female with PMHx significant of ESRD on HD M/W/F, CVA in 2020 with no residual deficits, essential HTN, T2DM, anemia of chronic disease, gout, and HLD who came to the ER secondary to tingling in her right hand and upper extremity. Pt found to be anemic with hemoglobin down to 6.6. Pt declined blood transfusion, as she is a Restaurant manager, fast food. Pt admitted for TIA work up.   Clinical Impression   Pt seen for OT evaluation this date. Prior to hospital admission, pt was independent in all aspects of ADL/IADL, working part time cleaning homes, and denies falls history in past 12 months. Pt lives with her spouse in a 1 story home with level entry. Currently pt reporting symptoms have resolved. Pt demonstrates baseline independence to perform ADL and mobility tasks and no strength, sensory, coordination, cognitive, or visual deficits appreciated with assessment. Pt does only endorse occasionally feeling slightly fatigued. Pt educated in use of rest breaks when working and other exertional activities to minimize risk of overexertion and falls, pt verbalized understanding. No skilled OT needs identified. Will sign off. Please re-consult if additional OT needs arise.    Follow Up Recommendations  No OT follow up    Equipment Recommendations  None recommended by OT    Recommendations for Other Services       Precautions / Restrictions Precautions Precautions: None Restrictions Weight Bearing Restrictions: No      Mobility Bed Mobility Overal bed mobility: Independent                  Transfers Overall transfer level: Independent                    Balance Overall balance assessment: Independent                                         ADL either performed or assessed with clinical judgement    ADL Overall ADL's : Independent                                             Vision Baseline Vision/History: Wears glasses Wears Glasses: At all times Patient Visual Report: No change from baseline       Perception     Praxis      Pertinent Vitals/Pain Pain Assessment: No/denies pain     Hand Dominance Right   Extremity/Trunk Assessment Upper Extremity Assessment Upper Extremity Assessment: Overall WFL for tasks assessed (sensation, strength, coordination WFL, hx L AV fistula)   Lower Extremity Assessment Lower Extremity Assessment: Overall WFL for tasks assessed (sensation, strength, coordination WFL)   Cervical / Trunk Assessment Cervical / Trunk Assessment: Normal   Communication Communication Communication: No difficulties   Cognition Arousal/Alertness: Awake/alert Behavior During Therapy: WFL for tasks assessed/performed Overall Cognitive Status: Within Functional Limits for tasks assessed                                     General Comments       Exercises Other Exercises Other Exercises: Pt educated in use of  rest breaks when working and other exertional activities to minimize risk of overexertion and falls, pt verbalized understanding   Shoulder Instructions      Home Living Family/patient expects to be discharged to:: Private residence Living Arrangements: Spouse/significant other Available Help at Discharge: Family;Available PRN/intermittently (both work part time) Type of Home: House Home Access: Level entry     [place]: One level     Bathroom Shower/Tub: Teacher, early years/pre: [place]: None          Prior Functioning/Environment Level of Independence: Independent        Comments: Driving, working part time cleaning homes        OT Problem List:        OT Treatment/Interventions:      OT Goals(Current goals can be found in the care plan section) Acute  Rehab OT Goals Patient Stated Goal: go home OT Goal Formulation: All assessment and education complete, DC therapy  OT Frequency:     Barriers to D/C:            Co-evaluation              AM-PAC OT "6 Clicks" Daily Activity     Outcome Measure Help from another person eating meals?: None Help from another person taking care of personal grooming?: None Help from another person toileting, which includes using toliet, bedpan, or urinal?: None Help from another person bathing (including washing, rinsing, drying)?: None Help from another person to put on and taking off regular upper body clothing?: None Help from another person to put on and taking off regular lower body clothing?: None 6 Click Score: 24   End of Session Nurse Communication: Mobility status  Activity Tolerance: Patient tolerated treatment well Patient left: in bed  OT Visit Diagnosis: Other abnormalities of gait and mobility (R26.89)                Time: 0175-1025 OT Time Calculation (min): 15 min Charges:  OT General Charges $OT Visit: 1 Visit OT Evaluation $OT Eval Low Complexity: 1 Low OT Treatments $Self Care/Home Management : 8-22 mins  Hanley Hays, MPH, MS, OTR/L ascom [phone] 06/04/21, 12:52 PM

## 2021-06-06 DIAGNOSIS — D509 Iron deficiency anemia, unspecified: Secondary | ICD-10-CM | POA: Diagnosis not present

## 2021-06-06 DIAGNOSIS — Z992 Dependence on renal dialysis: Secondary | ICD-10-CM | POA: Diagnosis not present

## 2021-06-06 DIAGNOSIS — D631 Anemia in chronic kidney disease: Secondary | ICD-10-CM | POA: Diagnosis not present

## 2021-06-06 DIAGNOSIS — R52 Pain, unspecified: Secondary | ICD-10-CM | POA: Diagnosis not present

## 2021-06-06 DIAGNOSIS — D689 Coagulation defect, unspecified: Secondary | ICD-10-CM | POA: Diagnosis not present

## 2021-06-06 DIAGNOSIS — N2581 Secondary hyperparathyroidism of renal origin: Secondary | ICD-10-CM | POA: Diagnosis not present

## 2021-06-06 DIAGNOSIS — N186 End stage renal disease: Secondary | ICD-10-CM | POA: Diagnosis not present

## 2021-06-07 ENCOUNTER — Ambulatory Visit: Payer: Medicare Other | Admitting: Nurse Practitioner

## 2021-06-07 DIAGNOSIS — D689 Coagulation defect, unspecified: Secondary | ICD-10-CM | POA: Diagnosis not present

## 2021-06-07 DIAGNOSIS — N2581 Secondary hyperparathyroidism of renal origin: Secondary | ICD-10-CM | POA: Diagnosis not present

## 2021-06-07 DIAGNOSIS — D509 Iron deficiency anemia, unspecified: Secondary | ICD-10-CM | POA: Diagnosis not present

## 2021-06-07 DIAGNOSIS — D631 Anemia in chronic kidney disease: Secondary | ICD-10-CM | POA: Diagnosis not present

## 2021-06-07 DIAGNOSIS — R52 Pain, unspecified: Secondary | ICD-10-CM | POA: Diagnosis not present

## 2021-06-07 DIAGNOSIS — Z992 Dependence on renal dialysis: Secondary | ICD-10-CM | POA: Diagnosis not present

## 2021-06-07 DIAGNOSIS — N186 End stage renal disease: Secondary | ICD-10-CM | POA: Diagnosis not present

## 2021-06-07 LAB — CBG MONITORING, ED
Glucose-Capillary: 123 mg/dL — ABNORMAL HIGH (ref 70–99)
Glucose-Capillary: 84 mg/dL (ref 70–99)
Glucose-Capillary: 124 mg/dL — ABNORMAL HIGH (ref 70–99)

## 2021-06-07 LAB — HEMOGLOBIN A1C
Hgb A1c MFr Bld: 5 % (ref 4.8–5.6)
Mean Plasma Glucose: 97 mg/dL

## 2021-06-08 DIAGNOSIS — D509 Iron deficiency anemia, unspecified: Secondary | ICD-10-CM | POA: Diagnosis not present

## 2021-06-08 DIAGNOSIS — N186 End stage renal disease: Secondary | ICD-10-CM | POA: Diagnosis not present

## 2021-06-08 DIAGNOSIS — N2581 Secondary hyperparathyroidism of renal origin: Secondary | ICD-10-CM | POA: Diagnosis not present

## 2021-06-08 DIAGNOSIS — D631 Anemia in chronic kidney disease: Secondary | ICD-10-CM | POA: Diagnosis not present

## 2021-06-08 DIAGNOSIS — Z992 Dependence on renal dialysis: Secondary | ICD-10-CM | POA: Diagnosis not present

## 2021-06-08 DIAGNOSIS — R52 Pain, unspecified: Secondary | ICD-10-CM | POA: Diagnosis not present

## 2021-06-08 DIAGNOSIS — D689 Coagulation defect, unspecified: Secondary | ICD-10-CM | POA: Diagnosis not present

## 2021-06-10 ENCOUNTER — Telehealth: Payer: Self-pay

## 2021-06-10 DIAGNOSIS — Z992 Dependence on renal dialysis: Secondary | ICD-10-CM | POA: Diagnosis not present

## 2021-06-10 DIAGNOSIS — N186 End stage renal disease: Secondary | ICD-10-CM | POA: Diagnosis not present

## 2021-06-10 DIAGNOSIS — D631 Anemia in chronic kidney disease: Secondary | ICD-10-CM | POA: Diagnosis not present

## 2021-06-10 DIAGNOSIS — D509 Iron deficiency anemia, unspecified: Secondary | ICD-10-CM | POA: Diagnosis not present

## 2021-06-10 DIAGNOSIS — D689 Coagulation defect, unspecified: Secondary | ICD-10-CM | POA: Diagnosis not present

## 2021-06-10 DIAGNOSIS — R52 Pain, unspecified: Secondary | ICD-10-CM | POA: Diagnosis not present

## 2021-06-10 DIAGNOSIS — N2581 Secondary hyperparathyroidism of renal origin: Secondary | ICD-10-CM | POA: Diagnosis not present

## 2021-06-10 NOTE — Telephone Encounter (Signed)
Pt called and stated that her kidney dr told her she needed to go to [facility] due to her hemoglobin being a 6, pt stated that she didn't want to go there and wanted [facility] to send her to the cancer center to have iron infusions.  Pt has seen oncology Dr Charlaine Dalton in the past and we advised pt to call the cancer center and see if they can see her again.

## 2021-06-13 DIAGNOSIS — N186 End stage renal disease: Secondary | ICD-10-CM | POA: Diagnosis not present

## 2021-06-13 DIAGNOSIS — N2581 Secondary hyperparathyroidism of renal origin: Secondary | ICD-10-CM | POA: Diagnosis not present

## 2021-06-13 DIAGNOSIS — D631 Anemia in chronic kidney disease: Secondary | ICD-10-CM | POA: Diagnosis not present

## 2021-06-13 DIAGNOSIS — R52 Pain, unspecified: Secondary | ICD-10-CM | POA: Diagnosis not present

## 2021-06-13 DIAGNOSIS — D509 Iron deficiency anemia, unspecified: Secondary | ICD-10-CM | POA: Diagnosis not present

## 2021-06-13 DIAGNOSIS — D689 Coagulation defect, unspecified: Secondary | ICD-10-CM | POA: Diagnosis not present

## 2021-06-13 DIAGNOSIS — Z992 Dependence on renal dialysis: Secondary | ICD-10-CM | POA: Diagnosis not present

## 2021-06-15 DIAGNOSIS — D509 Iron deficiency anemia, unspecified: Secondary | ICD-10-CM | POA: Diagnosis not present

## 2021-06-15 DIAGNOSIS — R52 Pain, unspecified: Secondary | ICD-10-CM | POA: Diagnosis not present

## 2021-06-15 DIAGNOSIS — Z992 Dependence on renal dialysis: Secondary | ICD-10-CM | POA: Diagnosis not present

## 2021-06-15 DIAGNOSIS — D689 Coagulation defect, unspecified: Secondary | ICD-10-CM | POA: Diagnosis not present

## 2021-06-15 DIAGNOSIS — D631 Anemia in chronic kidney disease: Secondary | ICD-10-CM | POA: Diagnosis not present

## 2021-06-15 DIAGNOSIS — N186 End stage renal disease: Secondary | ICD-10-CM | POA: Diagnosis not present

## 2021-06-15 DIAGNOSIS — N2581 Secondary hyperparathyroidism of renal origin: Secondary | ICD-10-CM | POA: Diagnosis not present

## 2021-06-17 DIAGNOSIS — Z992 Dependence on renal dialysis: Secondary | ICD-10-CM | POA: Diagnosis not present

## 2021-06-17 DIAGNOSIS — R52 Pain, unspecified: Secondary | ICD-10-CM | POA: Diagnosis not present

## 2021-06-17 DIAGNOSIS — N2581 Secondary hyperparathyroidism of renal origin: Secondary | ICD-10-CM | POA: Diagnosis not present

## 2021-06-17 DIAGNOSIS — D631 Anemia in chronic kidney disease: Secondary | ICD-10-CM | POA: Diagnosis not present

## 2021-06-17 DIAGNOSIS — D509 Iron deficiency anemia, unspecified: Secondary | ICD-10-CM | POA: Diagnosis not present

## 2021-06-17 DIAGNOSIS — N186 End stage renal disease: Secondary | ICD-10-CM | POA: Diagnosis not present

## 2021-06-17 DIAGNOSIS — D689 Coagulation defect, unspecified: Secondary | ICD-10-CM | POA: Diagnosis not present

## 2021-06-20 DIAGNOSIS — N186 End stage renal disease: Secondary | ICD-10-CM | POA: Diagnosis not present

## 2021-06-20 DIAGNOSIS — Z992 Dependence on renal dialysis: Secondary | ICD-10-CM | POA: Diagnosis not present

## 2021-06-20 DIAGNOSIS — R52 Pain, unspecified: Secondary | ICD-10-CM | POA: Diagnosis not present

## 2021-06-20 DIAGNOSIS — D631 Anemia in chronic kidney disease: Secondary | ICD-10-CM | POA: Diagnosis not present

## 2021-06-20 DIAGNOSIS — N2581 Secondary hyperparathyroidism of renal origin: Secondary | ICD-10-CM | POA: Diagnosis not present

## 2021-06-20 DIAGNOSIS — D689 Coagulation defect, unspecified: Secondary | ICD-10-CM | POA: Diagnosis not present

## 2021-06-20 DIAGNOSIS — D509 Iron deficiency anemia, unspecified: Secondary | ICD-10-CM | POA: Diagnosis not present

## 2021-06-22 DIAGNOSIS — D631 Anemia in chronic kidney disease: Secondary | ICD-10-CM | POA: Diagnosis not present

## 2021-06-22 DIAGNOSIS — N186 End stage renal disease: Secondary | ICD-10-CM | POA: Diagnosis not present

## 2021-06-22 DIAGNOSIS — N2581 Secondary hyperparathyroidism of renal origin: Secondary | ICD-10-CM | POA: Diagnosis not present

## 2021-06-22 DIAGNOSIS — R52 Pain, unspecified: Secondary | ICD-10-CM | POA: Diagnosis not present

## 2021-06-22 DIAGNOSIS — Z992 Dependence on renal dialysis: Secondary | ICD-10-CM | POA: Diagnosis not present

## 2021-06-22 DIAGNOSIS — D689 Coagulation defect, unspecified: Secondary | ICD-10-CM | POA: Diagnosis not present

## 2021-06-22 DIAGNOSIS — D509 Iron deficiency anemia, unspecified: Secondary | ICD-10-CM | POA: Diagnosis not present

## 2021-06-24 DIAGNOSIS — N186 End stage renal disease: Secondary | ICD-10-CM | POA: Diagnosis not present

## 2021-06-24 DIAGNOSIS — D631 Anemia in chronic kidney disease: Secondary | ICD-10-CM | POA: Diagnosis not present

## 2021-06-24 DIAGNOSIS — D509 Iron deficiency anemia, unspecified: Secondary | ICD-10-CM | POA: Diagnosis not present

## 2021-06-24 DIAGNOSIS — D689 Coagulation defect, unspecified: Secondary | ICD-10-CM | POA: Diagnosis not present

## 2021-06-24 DIAGNOSIS — R52 Pain, unspecified: Secondary | ICD-10-CM | POA: Diagnosis not present

## 2021-06-24 DIAGNOSIS — Z992 Dependence on renal dialysis: Secondary | ICD-10-CM | POA: Diagnosis not present

## 2021-06-24 DIAGNOSIS — N2581 Secondary hyperparathyroidism of renal origin: Secondary | ICD-10-CM | POA: Diagnosis not present

## 2021-06-27 DIAGNOSIS — N2581 Secondary hyperparathyroidism of renal origin: Secondary | ICD-10-CM | POA: Diagnosis not present

## 2021-06-27 DIAGNOSIS — D631 Anemia in chronic kidney disease: Secondary | ICD-10-CM | POA: Diagnosis not present

## 2021-06-27 DIAGNOSIS — D689 Coagulation defect, unspecified: Secondary | ICD-10-CM | POA: Diagnosis not present

## 2021-06-27 DIAGNOSIS — N186 End stage renal disease: Secondary | ICD-10-CM | POA: Diagnosis not present

## 2021-06-27 DIAGNOSIS — R52 Pain, unspecified: Secondary | ICD-10-CM | POA: Diagnosis not present

## 2021-06-27 DIAGNOSIS — Z992 Dependence on renal dialysis: Secondary | ICD-10-CM | POA: Diagnosis not present

## 2021-06-27 DIAGNOSIS — D509 Iron deficiency anemia, unspecified: Secondary | ICD-10-CM | POA: Diagnosis not present

## 2021-06-29 DIAGNOSIS — N186 End stage renal disease: Secondary | ICD-10-CM | POA: Diagnosis not present

## 2021-06-29 DIAGNOSIS — D509 Iron deficiency anemia, unspecified: Secondary | ICD-10-CM | POA: Diagnosis not present

## 2021-06-29 DIAGNOSIS — N2581 Secondary hyperparathyroidism of renal origin: Secondary | ICD-10-CM | POA: Diagnosis not present

## 2021-06-29 DIAGNOSIS — D631 Anemia in chronic kidney disease: Secondary | ICD-10-CM | POA: Diagnosis not present

## 2021-06-29 DIAGNOSIS — Z992 Dependence on renal dialysis: Secondary | ICD-10-CM | POA: Diagnosis not present

## 2021-06-29 DIAGNOSIS — D689 Coagulation defect, unspecified: Secondary | ICD-10-CM | POA: Diagnosis not present

## 2021-06-29 DIAGNOSIS — R52 Pain, unspecified: Secondary | ICD-10-CM | POA: Diagnosis not present

## 2021-07-01 DIAGNOSIS — D509 Iron deficiency anemia, unspecified: Secondary | ICD-10-CM | POA: Diagnosis not present

## 2021-07-01 DIAGNOSIS — N2581 Secondary hyperparathyroidism of renal origin: Secondary | ICD-10-CM | POA: Diagnosis not present

## 2021-07-01 DIAGNOSIS — D689 Coagulation defect, unspecified: Secondary | ICD-10-CM | POA: Diagnosis not present

## 2021-07-01 DIAGNOSIS — Z992 Dependence on renal dialysis: Secondary | ICD-10-CM | POA: Diagnosis not present

## 2021-07-01 DIAGNOSIS — R52 Pain, unspecified: Secondary | ICD-10-CM | POA: Diagnosis not present

## 2021-07-01 DIAGNOSIS — N186 End stage renal disease: Secondary | ICD-10-CM | POA: Diagnosis not present

## 2021-07-01 DIAGNOSIS — D631 Anemia in chronic kidney disease: Secondary | ICD-10-CM | POA: Diagnosis not present

## 2021-07-03 DIAGNOSIS — N040 Nephrotic syndrome with minor glomerular abnormality: Secondary | ICD-10-CM | POA: Diagnosis not present

## 2021-07-03 DIAGNOSIS — N186 End stage renal disease: Secondary | ICD-10-CM | POA: Diagnosis not present

## 2021-07-03 DIAGNOSIS — Z992 Dependence on renal dialysis: Secondary | ICD-10-CM | POA: Diagnosis not present

## 2021-07-04 DIAGNOSIS — R52 Pain, unspecified: Secondary | ICD-10-CM | POA: Diagnosis not present

## 2021-07-04 DIAGNOSIS — D509 Iron deficiency anemia, unspecified: Secondary | ICD-10-CM | POA: Diagnosis not present

## 2021-07-04 DIAGNOSIS — Z992 Dependence on renal dialysis: Secondary | ICD-10-CM | POA: Diagnosis not present

## 2021-07-04 DIAGNOSIS — N2581 Secondary hyperparathyroidism of renal origin: Secondary | ICD-10-CM | POA: Diagnosis not present

## 2021-07-04 DIAGNOSIS — D631 Anemia in chronic kidney disease: Secondary | ICD-10-CM | POA: Diagnosis not present

## 2021-07-04 DIAGNOSIS — N186 End stage renal disease: Secondary | ICD-10-CM | POA: Diagnosis not present

## 2021-07-04 DIAGNOSIS — D689 Coagulation defect, unspecified: Secondary | ICD-10-CM | POA: Diagnosis not present

## 2021-07-06 DIAGNOSIS — Z992 Dependence on renal dialysis: Secondary | ICD-10-CM | POA: Diagnosis not present

## 2021-07-06 DIAGNOSIS — N2581 Secondary hyperparathyroidism of renal origin: Secondary | ICD-10-CM | POA: Diagnosis not present

## 2021-07-06 DIAGNOSIS — D631 Anemia in chronic kidney disease: Secondary | ICD-10-CM | POA: Diagnosis not present

## 2021-07-06 DIAGNOSIS — D689 Coagulation defect, unspecified: Secondary | ICD-10-CM | POA: Diagnosis not present

## 2021-07-06 DIAGNOSIS — D509 Iron deficiency anemia, unspecified: Secondary | ICD-10-CM | POA: Diagnosis not present

## 2021-07-06 DIAGNOSIS — N186 End stage renal disease: Secondary | ICD-10-CM | POA: Diagnosis not present

## 2021-07-06 DIAGNOSIS — R52 Pain, unspecified: Secondary | ICD-10-CM | POA: Diagnosis not present

## 2021-07-08 DIAGNOSIS — Z992 Dependence on renal dialysis: Secondary | ICD-10-CM | POA: Diagnosis not present

## 2021-07-08 DIAGNOSIS — D509 Iron deficiency anemia, unspecified: Secondary | ICD-10-CM | POA: Diagnosis not present

## 2021-07-08 DIAGNOSIS — D689 Coagulation defect, unspecified: Secondary | ICD-10-CM | POA: Diagnosis not present

## 2021-07-08 DIAGNOSIS — D631 Anemia in chronic kidney disease: Secondary | ICD-10-CM | POA: Diagnosis not present

## 2021-07-08 DIAGNOSIS — R52 Pain, unspecified: Secondary | ICD-10-CM | POA: Diagnosis not present

## 2021-07-08 DIAGNOSIS — N2581 Secondary hyperparathyroidism of renal origin: Secondary | ICD-10-CM | POA: Diagnosis not present

## 2021-07-08 DIAGNOSIS — N186 End stage renal disease: Secondary | ICD-10-CM | POA: Diagnosis not present

## 2021-07-11 DIAGNOSIS — Z992 Dependence on renal dialysis: Secondary | ICD-10-CM | POA: Diagnosis not present

## 2021-07-11 DIAGNOSIS — D509 Iron deficiency anemia, unspecified: Secondary | ICD-10-CM | POA: Diagnosis not present

## 2021-07-11 DIAGNOSIS — N186 End stage renal disease: Secondary | ICD-10-CM | POA: Diagnosis not present

## 2021-07-11 DIAGNOSIS — R52 Pain, unspecified: Secondary | ICD-10-CM | POA: Diagnosis not present

## 2021-07-11 DIAGNOSIS — D631 Anemia in chronic kidney disease: Secondary | ICD-10-CM | POA: Diagnosis not present

## 2021-07-11 DIAGNOSIS — D689 Coagulation defect, unspecified: Secondary | ICD-10-CM | POA: Diagnosis not present

## 2021-07-11 DIAGNOSIS — N2581 Secondary hyperparathyroidism of renal origin: Secondary | ICD-10-CM | POA: Diagnosis not present

## 2021-07-13 DIAGNOSIS — D509 Iron deficiency anemia, unspecified: Secondary | ICD-10-CM | POA: Diagnosis not present

## 2021-07-13 DIAGNOSIS — R52 Pain, unspecified: Secondary | ICD-10-CM | POA: Diagnosis not present

## 2021-07-13 DIAGNOSIS — D689 Coagulation defect, unspecified: Secondary | ICD-10-CM | POA: Diagnosis not present

## 2021-07-13 DIAGNOSIS — N2581 Secondary hyperparathyroidism of renal origin: Secondary | ICD-10-CM | POA: Diagnosis not present

## 2021-07-13 DIAGNOSIS — N186 End stage renal disease: Secondary | ICD-10-CM | POA: Diagnosis not present

## 2021-07-13 DIAGNOSIS — D631 Anemia in chronic kidney disease: Secondary | ICD-10-CM | POA: Diagnosis not present

## 2021-07-13 DIAGNOSIS — Z992 Dependence on renal dialysis: Secondary | ICD-10-CM | POA: Diagnosis not present

## 2021-07-15 DIAGNOSIS — D689 Coagulation defect, unspecified: Secondary | ICD-10-CM | POA: Diagnosis not present

## 2021-07-15 DIAGNOSIS — D631 Anemia in chronic kidney disease: Secondary | ICD-10-CM | POA: Diagnosis not present

## 2021-07-15 DIAGNOSIS — Z992 Dependence on renal dialysis: Secondary | ICD-10-CM | POA: Diagnosis not present

## 2021-07-15 DIAGNOSIS — D509 Iron deficiency anemia, unspecified: Secondary | ICD-10-CM | POA: Diagnosis not present

## 2021-07-15 DIAGNOSIS — R52 Pain, unspecified: Secondary | ICD-10-CM | POA: Diagnosis not present

## 2021-07-15 DIAGNOSIS — N2581 Secondary hyperparathyroidism of renal origin: Secondary | ICD-10-CM | POA: Diagnosis not present

## 2021-07-15 DIAGNOSIS — N186 End stage renal disease: Secondary | ICD-10-CM | POA: Diagnosis not present

## 2021-07-18 DIAGNOSIS — N2581 Secondary hyperparathyroidism of renal origin: Secondary | ICD-10-CM | POA: Diagnosis not present

## 2021-07-18 DIAGNOSIS — D689 Coagulation defect, unspecified: Secondary | ICD-10-CM | POA: Diagnosis not present

## 2021-07-18 DIAGNOSIS — D631 Anemia in chronic kidney disease: Secondary | ICD-10-CM | POA: Diagnosis not present

## 2021-07-18 DIAGNOSIS — N186 End stage renal disease: Secondary | ICD-10-CM | POA: Diagnosis not present

## 2021-07-18 DIAGNOSIS — D509 Iron deficiency anemia, unspecified: Secondary | ICD-10-CM | POA: Diagnosis not present

## 2021-07-18 DIAGNOSIS — R52 Pain, unspecified: Secondary | ICD-10-CM | POA: Diagnosis not present

## 2021-07-18 DIAGNOSIS — Z992 Dependence on renal dialysis: Secondary | ICD-10-CM | POA: Diagnosis not present

## 2021-07-20 DIAGNOSIS — D689 Coagulation defect, unspecified: Secondary | ICD-10-CM | POA: Diagnosis not present

## 2021-07-20 DIAGNOSIS — Z992 Dependence on renal dialysis: Secondary | ICD-10-CM | POA: Diagnosis not present

## 2021-07-20 DIAGNOSIS — R52 Pain, unspecified: Secondary | ICD-10-CM | POA: Diagnosis not present

## 2021-07-20 DIAGNOSIS — N186 End stage renal disease: Secondary | ICD-10-CM | POA: Diagnosis not present

## 2021-07-20 DIAGNOSIS — D509 Iron deficiency anemia, unspecified: Secondary | ICD-10-CM | POA: Diagnosis not present

## 2021-07-20 DIAGNOSIS — N2581 Secondary hyperparathyroidism of renal origin: Secondary | ICD-10-CM | POA: Diagnosis not present

## 2021-07-20 DIAGNOSIS — D631 Anemia in chronic kidney disease: Secondary | ICD-10-CM | POA: Diagnosis not present

## 2021-07-22 DIAGNOSIS — D509 Iron deficiency anemia, unspecified: Secondary | ICD-10-CM | POA: Diagnosis not present

## 2021-07-22 DIAGNOSIS — N2581 Secondary hyperparathyroidism of renal origin: Secondary | ICD-10-CM | POA: Diagnosis not present

## 2021-07-22 DIAGNOSIS — R52 Pain, unspecified: Secondary | ICD-10-CM | POA: Diagnosis not present

## 2021-07-22 DIAGNOSIS — N186 End stage renal disease: Secondary | ICD-10-CM | POA: Diagnosis not present

## 2021-07-22 DIAGNOSIS — Z992 Dependence on renal dialysis: Secondary | ICD-10-CM | POA: Diagnosis not present

## 2021-07-22 DIAGNOSIS — D631 Anemia in chronic kidney disease: Secondary | ICD-10-CM | POA: Diagnosis not present

## 2021-07-22 DIAGNOSIS — D689 Coagulation defect, unspecified: Secondary | ICD-10-CM | POA: Diagnosis not present

## 2021-07-25 DIAGNOSIS — D631 Anemia in chronic kidney disease: Secondary | ICD-10-CM | POA: Diagnosis not present

## 2021-07-25 DIAGNOSIS — R52 Pain, unspecified: Secondary | ICD-10-CM | POA: Diagnosis not present

## 2021-07-25 DIAGNOSIS — D689 Coagulation defect, unspecified: Secondary | ICD-10-CM | POA: Diagnosis not present

## 2021-07-25 DIAGNOSIS — N186 End stage renal disease: Secondary | ICD-10-CM | POA: Diagnosis not present

## 2021-07-25 DIAGNOSIS — N2581 Secondary hyperparathyroidism of renal origin: Secondary | ICD-10-CM | POA: Diagnosis not present

## 2021-07-25 DIAGNOSIS — Z992 Dependence on renal dialysis: Secondary | ICD-10-CM | POA: Diagnosis not present

## 2021-07-25 DIAGNOSIS — D509 Iron deficiency anemia, unspecified: Secondary | ICD-10-CM | POA: Diagnosis not present

## 2021-07-27 ENCOUNTER — Other Ambulatory Visit: Payer: Self-pay | Admitting: Physician Assistant

## 2021-07-27 ENCOUNTER — Other Ambulatory Visit (HOSPITAL_COMMUNITY): Payer: Self-pay | Admitting: Physician Assistant

## 2021-07-27 DIAGNOSIS — D689 Coagulation defect, unspecified: Secondary | ICD-10-CM | POA: Diagnosis not present

## 2021-07-27 DIAGNOSIS — R011 Cardiac murmur, unspecified: Secondary | ICD-10-CM

## 2021-07-27 DIAGNOSIS — N186 End stage renal disease: Secondary | ICD-10-CM | POA: Diagnosis not present

## 2021-07-27 DIAGNOSIS — R52 Pain, unspecified: Secondary | ICD-10-CM | POA: Diagnosis not present

## 2021-07-27 DIAGNOSIS — D631 Anemia in chronic kidney disease: Secondary | ICD-10-CM | POA: Diagnosis not present

## 2021-07-27 DIAGNOSIS — D509 Iron deficiency anemia, unspecified: Secondary | ICD-10-CM | POA: Diagnosis not present

## 2021-07-27 DIAGNOSIS — N2581 Secondary hyperparathyroidism of renal origin: Secondary | ICD-10-CM | POA: Diagnosis not present

## 2021-07-27 DIAGNOSIS — Z992 Dependence on renal dialysis: Secondary | ICD-10-CM | POA: Diagnosis not present

## 2021-07-28 ENCOUNTER — Ambulatory Visit: Payer: Medicare Other | Admitting: Physician Assistant

## 2021-07-29 DIAGNOSIS — D689 Coagulation defect, unspecified: Secondary | ICD-10-CM | POA: Diagnosis not present

## 2021-07-29 DIAGNOSIS — Z992 Dependence on renal dialysis: Secondary | ICD-10-CM | POA: Diagnosis not present

## 2021-07-29 DIAGNOSIS — D509 Iron deficiency anemia, unspecified: Secondary | ICD-10-CM | POA: Diagnosis not present

## 2021-07-29 DIAGNOSIS — N186 End stage renal disease: Secondary | ICD-10-CM | POA: Diagnosis not present

## 2021-07-29 DIAGNOSIS — N2581 Secondary hyperparathyroidism of renal origin: Secondary | ICD-10-CM | POA: Diagnosis not present

## 2021-07-29 DIAGNOSIS — R52 Pain, unspecified: Secondary | ICD-10-CM | POA: Diagnosis not present

## 2021-07-29 DIAGNOSIS — D631 Anemia in chronic kidney disease: Secondary | ICD-10-CM | POA: Diagnosis not present

## 2021-07-30 ENCOUNTER — Other Ambulatory Visit: Payer: Self-pay | Admitting: Internal Medicine

## 2021-08-01 ENCOUNTER — Telehealth: Payer: Self-pay

## 2021-08-01 DIAGNOSIS — D689 Coagulation defect, unspecified: Secondary | ICD-10-CM | POA: Diagnosis not present

## 2021-08-01 DIAGNOSIS — D631 Anemia in chronic kidney disease: Secondary | ICD-10-CM | POA: Diagnosis not present

## 2021-08-01 DIAGNOSIS — D509 Iron deficiency anemia, unspecified: Secondary | ICD-10-CM | POA: Diagnosis not present

## 2021-08-01 DIAGNOSIS — R52 Pain, unspecified: Secondary | ICD-10-CM | POA: Diagnosis not present

## 2021-08-01 DIAGNOSIS — N2581 Secondary hyperparathyroidism of renal origin: Secondary | ICD-10-CM | POA: Diagnosis not present

## 2021-08-01 DIAGNOSIS — Z992 Dependence on renal dialysis: Secondary | ICD-10-CM | POA: Diagnosis not present

## 2021-08-01 DIAGNOSIS — N186 End stage renal disease: Secondary | ICD-10-CM | POA: Diagnosis not present

## 2021-08-01 NOTE — Telephone Encounter (Signed)
Patient scheduled for US thyroid, abd complete on 08/09/21 @ 10:30 at [facility] due to patient having dialysis.  Patient also scheduled for echo on 08/02/21 @ 4:00 [facility] due to patient having dialysis

## 2021-08-02 ENCOUNTER — Other Ambulatory Visit: Payer: Medicare Other

## 2021-08-03 DIAGNOSIS — N040 Nephrotic syndrome with minor glomerular abnormality: Secondary | ICD-10-CM | POA: Diagnosis not present

## 2021-08-03 DIAGNOSIS — N186 End stage renal disease: Secondary | ICD-10-CM | POA: Diagnosis not present

## 2021-08-03 DIAGNOSIS — Z992 Dependence on renal dialysis: Secondary | ICD-10-CM | POA: Diagnosis not present

## 2021-08-03 DIAGNOSIS — D689 Coagulation defect, unspecified: Secondary | ICD-10-CM | POA: Diagnosis not present

## 2021-08-03 DIAGNOSIS — N2581 Secondary hyperparathyroidism of renal origin: Secondary | ICD-10-CM | POA: Diagnosis not present

## 2021-08-03 DIAGNOSIS — D509 Iron deficiency anemia, unspecified: Secondary | ICD-10-CM | POA: Diagnosis not present

## 2021-08-03 DIAGNOSIS — D631 Anemia in chronic kidney disease: Secondary | ICD-10-CM | POA: Diagnosis not present

## 2021-08-03 DIAGNOSIS — R52 Pain, unspecified: Secondary | ICD-10-CM | POA: Diagnosis not present

## 2021-08-05 DIAGNOSIS — N2581 Secondary hyperparathyroidism of renal origin: Secondary | ICD-10-CM | POA: Diagnosis not present

## 2021-08-05 DIAGNOSIS — N186 End stage renal disease: Secondary | ICD-10-CM | POA: Diagnosis not present

## 2021-08-05 DIAGNOSIS — D689 Coagulation defect, unspecified: Secondary | ICD-10-CM | POA: Diagnosis not present

## 2021-08-05 DIAGNOSIS — Z992 Dependence on renal dialysis: Secondary | ICD-10-CM | POA: Diagnosis not present

## 2021-08-08 DIAGNOSIS — N2581 Secondary hyperparathyroidism of renal origin: Secondary | ICD-10-CM | POA: Diagnosis not present

## 2021-08-08 DIAGNOSIS — Z992 Dependence on renal dialysis: Secondary | ICD-10-CM | POA: Diagnosis not present

## 2021-08-08 DIAGNOSIS — N186 End stage renal disease: Secondary | ICD-10-CM | POA: Diagnosis not present

## 2021-08-08 DIAGNOSIS — D689 Coagulation defect, unspecified: Secondary | ICD-10-CM | POA: Diagnosis not present

## 2021-08-09 ENCOUNTER — Ambulatory Visit: Payer: Medicare Other

## 2021-08-10 DIAGNOSIS — Z992 Dependence on renal dialysis: Secondary | ICD-10-CM | POA: Diagnosis not present

## 2021-08-10 DIAGNOSIS — N186 End stage renal disease: Secondary | ICD-10-CM | POA: Diagnosis not present

## 2021-08-10 DIAGNOSIS — D689 Coagulation defect, unspecified: Secondary | ICD-10-CM | POA: Diagnosis not present

## 2021-08-10 DIAGNOSIS — N2581 Secondary hyperparathyroidism of renal origin: Secondary | ICD-10-CM | POA: Diagnosis not present

## 2021-08-11 ENCOUNTER — Other Ambulatory Visit: Payer: Self-pay | Admitting: Physician Assistant

## 2021-08-11 DIAGNOSIS — I10 Essential (primary) hypertension: Secondary | ICD-10-CM

## 2021-08-12 DIAGNOSIS — Z992 Dependence on renal dialysis: Secondary | ICD-10-CM | POA: Diagnosis not present

## 2021-08-12 DIAGNOSIS — N186 End stage renal disease: Secondary | ICD-10-CM | POA: Diagnosis not present

## 2021-08-12 DIAGNOSIS — D689 Coagulation defect, unspecified: Secondary | ICD-10-CM | POA: Diagnosis not present

## 2021-08-12 DIAGNOSIS — N2581 Secondary hyperparathyroidism of renal origin: Secondary | ICD-10-CM | POA: Diagnosis not present

## 2021-08-15 DIAGNOSIS — N2581 Secondary hyperparathyroidism of renal origin: Secondary | ICD-10-CM | POA: Diagnosis not present

## 2021-08-15 DIAGNOSIS — D689 Coagulation defect, unspecified: Secondary | ICD-10-CM | POA: Diagnosis not present

## 2021-08-15 DIAGNOSIS — N186 End stage renal disease: Secondary | ICD-10-CM | POA: Diagnosis not present

## 2021-08-15 DIAGNOSIS — Z992 Dependence on renal dialysis: Secondary | ICD-10-CM | POA: Diagnosis not present

## 2021-08-17 DIAGNOSIS — N2581 Secondary hyperparathyroidism of renal origin: Secondary | ICD-10-CM | POA: Diagnosis not present

## 2021-08-17 DIAGNOSIS — N186 End stage renal disease: Secondary | ICD-10-CM | POA: Diagnosis not present

## 2021-08-17 DIAGNOSIS — D689 Coagulation defect, unspecified: Secondary | ICD-10-CM | POA: Diagnosis not present

## 2021-08-17 DIAGNOSIS — Z992 Dependence on renal dialysis: Secondary | ICD-10-CM | POA: Diagnosis not present

## 2021-08-18 ENCOUNTER — Ambulatory Visit: Payer: Medicare Other | Attending: Internal Medicine

## 2021-08-18 ENCOUNTER — Ambulatory Visit: Admission: RE | Admit: 2021-08-18 | Payer: Medicare Other | Source: Ambulatory Visit

## 2021-08-19 DIAGNOSIS — N186 End stage renal disease: Secondary | ICD-10-CM | POA: Diagnosis not present

## 2021-08-19 DIAGNOSIS — D689 Coagulation defect, unspecified: Secondary | ICD-10-CM | POA: Diagnosis not present

## 2021-08-19 DIAGNOSIS — Z992 Dependence on renal dialysis: Secondary | ICD-10-CM | POA: Diagnosis not present

## 2021-08-19 DIAGNOSIS — N2581 Secondary hyperparathyroidism of renal origin: Secondary | ICD-10-CM | POA: Diagnosis not present

## 2021-08-22 DIAGNOSIS — Z992 Dependence on renal dialysis: Secondary | ICD-10-CM | POA: Diagnosis not present

## 2021-08-22 DIAGNOSIS — N186 End stage renal disease: Secondary | ICD-10-CM | POA: Diagnosis not present

## 2021-08-22 DIAGNOSIS — D689 Coagulation defect, unspecified: Secondary | ICD-10-CM | POA: Diagnosis not present

## 2021-08-22 DIAGNOSIS — N2581 Secondary hyperparathyroidism of renal origin: Secondary | ICD-10-CM | POA: Diagnosis not present

## 2021-08-24 DIAGNOSIS — D689 Coagulation defect, unspecified: Secondary | ICD-10-CM | POA: Diagnosis not present

## 2021-08-24 DIAGNOSIS — Z992 Dependence on renal dialysis: Secondary | ICD-10-CM | POA: Diagnosis not present

## 2021-08-24 DIAGNOSIS — N186 End stage renal disease: Secondary | ICD-10-CM | POA: Diagnosis not present

## 2021-08-24 DIAGNOSIS — N2581 Secondary hyperparathyroidism of renal origin: Secondary | ICD-10-CM | POA: Diagnosis not present

## 2021-08-25 ENCOUNTER — Ambulatory Visit: Payer: Medicare Other | Admitting: Physician Assistant

## 2021-08-26 DIAGNOSIS — N186 End stage renal disease: Secondary | ICD-10-CM | POA: Diagnosis not present

## 2021-08-26 DIAGNOSIS — Z992 Dependence on renal dialysis: Secondary | ICD-10-CM | POA: Diagnosis not present

## 2021-08-26 DIAGNOSIS — N2581 Secondary hyperparathyroidism of renal origin: Secondary | ICD-10-CM | POA: Diagnosis not present

## 2021-08-26 DIAGNOSIS — D689 Coagulation defect, unspecified: Secondary | ICD-10-CM | POA: Diagnosis not present

## 2021-08-29 ENCOUNTER — Other Ambulatory Visit: Payer: Self-pay

## 2021-08-29 ENCOUNTER — Telehealth: Payer: Self-pay

## 2021-08-29 DIAGNOSIS — D689 Coagulation defect, unspecified: Secondary | ICD-10-CM | POA: Diagnosis not present

## 2021-08-29 DIAGNOSIS — N186 End stage renal disease: Secondary | ICD-10-CM | POA: Diagnosis not present

## 2021-08-29 DIAGNOSIS — N2581 Secondary hyperparathyroidism of renal origin: Secondary | ICD-10-CM | POA: Diagnosis not present

## 2021-08-29 DIAGNOSIS — Z992 Dependence on renal dialysis: Secondary | ICD-10-CM | POA: Diagnosis not present

## 2021-08-29 NOTE — Telephone Encounter (Signed)
Lmom that which med she like [place] to send to [facility]

## 2021-08-30 ENCOUNTER — Telehealth: Payer: Self-pay

## 2021-08-30 ENCOUNTER — Other Ambulatory Visit: Payer: Self-pay

## 2021-08-30 NOTE — Telephone Encounter (Signed)
Pt called back that she called her cardiology for her bp med due to the they prescribed her carvedilol

## 2021-08-31 DIAGNOSIS — N2581 Secondary hyperparathyroidism of renal origin: Secondary | ICD-10-CM | POA: Diagnosis not present

## 2021-08-31 DIAGNOSIS — Z992 Dependence on renal dialysis: Secondary | ICD-10-CM | POA: Diagnosis not present

## 2021-08-31 DIAGNOSIS — D689 Coagulation defect, unspecified: Secondary | ICD-10-CM | POA: Diagnosis not present

## 2021-08-31 DIAGNOSIS — N186 End stage renal disease: Secondary | ICD-10-CM | POA: Diagnosis not present

## 2021-09-02 DIAGNOSIS — Z992 Dependence on renal dialysis: Secondary | ICD-10-CM | POA: Diagnosis not present

## 2021-09-02 DIAGNOSIS — D689 Coagulation defect, unspecified: Secondary | ICD-10-CM | POA: Diagnosis not present

## 2021-09-02 DIAGNOSIS — N2581 Secondary hyperparathyroidism of renal origin: Secondary | ICD-10-CM | POA: Diagnosis not present

## 2021-09-02 DIAGNOSIS — N040 Nephrotic syndrome with minor glomerular abnormality: Secondary | ICD-10-CM | POA: Diagnosis not present

## 2021-09-02 DIAGNOSIS — N186 End stage renal disease: Secondary | ICD-10-CM | POA: Diagnosis not present

## 2021-09-05 DIAGNOSIS — Z992 Dependence on renal dialysis: Secondary | ICD-10-CM | POA: Diagnosis not present

## 2021-09-05 DIAGNOSIS — D689 Coagulation defect, unspecified: Secondary | ICD-10-CM | POA: Diagnosis not present

## 2021-09-05 DIAGNOSIS — N186 End stage renal disease: Secondary | ICD-10-CM | POA: Diagnosis not present

## 2021-09-05 DIAGNOSIS — N2581 Secondary hyperparathyroidism of renal origin: Secondary | ICD-10-CM | POA: Diagnosis not present

## 2021-09-07 DIAGNOSIS — N2581 Secondary hyperparathyroidism of renal origin: Secondary | ICD-10-CM | POA: Diagnosis not present

## 2021-09-07 DIAGNOSIS — N186 End stage renal disease: Secondary | ICD-10-CM | POA: Diagnosis not present

## 2021-09-07 DIAGNOSIS — D689 Coagulation defect, unspecified: Secondary | ICD-10-CM | POA: Diagnosis not present

## 2021-09-07 DIAGNOSIS — Z992 Dependence on renal dialysis: Secondary | ICD-10-CM | POA: Diagnosis not present

## 2021-09-09 DIAGNOSIS — D689 Coagulation defect, unspecified: Secondary | ICD-10-CM | POA: Diagnosis not present

## 2021-09-09 DIAGNOSIS — N2581 Secondary hyperparathyroidism of renal origin: Secondary | ICD-10-CM | POA: Diagnosis not present

## 2021-09-09 DIAGNOSIS — N186 End stage renal disease: Secondary | ICD-10-CM | POA: Diagnosis not present

## 2021-09-09 DIAGNOSIS — Z992 Dependence on renal dialysis: Secondary | ICD-10-CM | POA: Diagnosis not present

## 2021-09-12 DIAGNOSIS — N186 End stage renal disease: Secondary | ICD-10-CM | POA: Diagnosis not present

## 2021-09-12 DIAGNOSIS — D689 Coagulation defect, unspecified: Secondary | ICD-10-CM | POA: Diagnosis not present

## 2021-09-12 DIAGNOSIS — Z992 Dependence on renal dialysis: Secondary | ICD-10-CM | POA: Diagnosis not present

## 2021-09-12 DIAGNOSIS — N2581 Secondary hyperparathyroidism of renal origin: Secondary | ICD-10-CM | POA: Diagnosis not present

## 2021-09-14 DIAGNOSIS — N2581 Secondary hyperparathyroidism of renal origin: Secondary | ICD-10-CM | POA: Diagnosis not present

## 2021-09-14 DIAGNOSIS — Z992 Dependence on renal dialysis: Secondary | ICD-10-CM | POA: Diagnosis not present

## 2021-09-14 DIAGNOSIS — N186 End stage renal disease: Secondary | ICD-10-CM | POA: Diagnosis not present

## 2021-09-14 DIAGNOSIS — D689 Coagulation defect, unspecified: Secondary | ICD-10-CM | POA: Diagnosis not present

## 2021-09-16 DIAGNOSIS — N2581 Secondary hyperparathyroidism of renal origin: Secondary | ICD-10-CM | POA: Diagnosis not present

## 2021-09-16 DIAGNOSIS — N186 End stage renal disease: Secondary | ICD-10-CM | POA: Diagnosis not present

## 2021-09-16 DIAGNOSIS — Z992 Dependence on renal dialysis: Secondary | ICD-10-CM | POA: Diagnosis not present

## 2021-09-16 DIAGNOSIS — D689 Coagulation defect, unspecified: Secondary | ICD-10-CM | POA: Diagnosis not present

## 2021-09-19 DIAGNOSIS — Z992 Dependence on renal dialysis: Secondary | ICD-10-CM | POA: Diagnosis not present

## 2021-09-19 DIAGNOSIS — N2581 Secondary hyperparathyroidism of renal origin: Secondary | ICD-10-CM | POA: Diagnosis not present

## 2021-09-19 DIAGNOSIS — D689 Coagulation defect, unspecified: Secondary | ICD-10-CM | POA: Diagnosis not present

## 2021-09-19 DIAGNOSIS — N186 End stage renal disease: Secondary | ICD-10-CM | POA: Diagnosis not present

## 2021-09-20 ENCOUNTER — Ambulatory Visit (INDEPENDENT_AMBULATORY_CARE_PROVIDER_SITE_OTHER): Payer: Medicare Other | Admitting: Nurse Practitioner

## 2021-09-20 ENCOUNTER — Encounter (INDEPENDENT_AMBULATORY_CARE_PROVIDER_SITE_OTHER): Payer: Medicare Other

## 2021-09-21 ENCOUNTER — Other Ambulatory Visit: Payer: Medicare Other

## 2021-09-21 DIAGNOSIS — N2581 Secondary hyperparathyroidism of renal origin: Secondary | ICD-10-CM | POA: Diagnosis not present

## 2021-09-21 DIAGNOSIS — Z992 Dependence on renal dialysis: Secondary | ICD-10-CM | POA: Diagnosis not present

## 2021-09-21 DIAGNOSIS — N186 End stage renal disease: Secondary | ICD-10-CM | POA: Diagnosis not present

## 2021-09-21 DIAGNOSIS — D689 Coagulation defect, unspecified: Secondary | ICD-10-CM | POA: Diagnosis not present

## 2021-09-23 ENCOUNTER — Other Ambulatory Visit: Payer: Medicare Other

## 2021-09-23 DIAGNOSIS — N186 End stage renal disease: Secondary | ICD-10-CM | POA: Diagnosis not present

## 2021-09-23 DIAGNOSIS — Z992 Dependence on renal dialysis: Secondary | ICD-10-CM | POA: Diagnosis not present

## 2021-09-23 DIAGNOSIS — D689 Coagulation defect, unspecified: Secondary | ICD-10-CM | POA: Diagnosis not present

## 2021-09-23 DIAGNOSIS — N2581 Secondary hyperparathyroidism of renal origin: Secondary | ICD-10-CM | POA: Diagnosis not present

## 2021-09-26 DIAGNOSIS — N2581 Secondary hyperparathyroidism of renal origin: Secondary | ICD-10-CM | POA: Diagnosis not present

## 2021-09-26 DIAGNOSIS — D689 Coagulation defect, unspecified: Secondary | ICD-10-CM | POA: Diagnosis not present

## 2021-09-26 DIAGNOSIS — Z992 Dependence on renal dialysis: Secondary | ICD-10-CM | POA: Diagnosis not present

## 2021-09-26 DIAGNOSIS — N186 End stage renal disease: Secondary | ICD-10-CM | POA: Diagnosis not present

## 2021-09-28 DIAGNOSIS — Z992 Dependence on renal dialysis: Secondary | ICD-10-CM | POA: Diagnosis not present

## 2021-09-28 DIAGNOSIS — D689 Coagulation defect, unspecified: Secondary | ICD-10-CM | POA: Diagnosis not present

## 2021-09-28 DIAGNOSIS — N2581 Secondary hyperparathyroidism of renal origin: Secondary | ICD-10-CM | POA: Diagnosis not present

## 2021-09-28 DIAGNOSIS — N186 End stage renal disease: Secondary | ICD-10-CM | POA: Diagnosis not present

## 2021-09-30 DIAGNOSIS — N186 End stage renal disease: Secondary | ICD-10-CM | POA: Diagnosis not present

## 2021-09-30 DIAGNOSIS — D689 Coagulation defect, unspecified: Secondary | ICD-10-CM | POA: Diagnosis not present

## 2021-09-30 DIAGNOSIS — N2581 Secondary hyperparathyroidism of renal origin: Secondary | ICD-10-CM | POA: Diagnosis not present

## 2021-09-30 DIAGNOSIS — Z992 Dependence on renal dialysis: Secondary | ICD-10-CM | POA: Diagnosis not present

## 2021-10-03 DIAGNOSIS — Z992 Dependence on renal dialysis: Secondary | ICD-10-CM | POA: Diagnosis not present

## 2021-10-03 DIAGNOSIS — N186 End stage renal disease: Secondary | ICD-10-CM | POA: Diagnosis not present

## 2021-10-03 DIAGNOSIS — D689 Coagulation defect, unspecified: Secondary | ICD-10-CM | POA: Diagnosis not present

## 2021-10-03 DIAGNOSIS — N2581 Secondary hyperparathyroidism of renal origin: Secondary | ICD-10-CM | POA: Diagnosis not present

## 2021-10-03 DIAGNOSIS — N040 Nephrotic syndrome with minor glomerular abnormality: Secondary | ICD-10-CM | POA: Diagnosis not present

## 2021-10-05 DIAGNOSIS — D509 Iron deficiency anemia, unspecified: Secondary | ICD-10-CM | POA: Diagnosis not present

## 2021-10-05 DIAGNOSIS — D631 Anemia in chronic kidney disease: Secondary | ICD-10-CM | POA: Diagnosis not present

## 2021-10-05 DIAGNOSIS — Z992 Dependence on renal dialysis: Secondary | ICD-10-CM | POA: Diagnosis not present

## 2021-10-05 DIAGNOSIS — N2581 Secondary hyperparathyroidism of renal origin: Secondary | ICD-10-CM | POA: Diagnosis not present

## 2021-10-05 DIAGNOSIS — R519 Headache, unspecified: Secondary | ICD-10-CM | POA: Diagnosis not present

## 2021-10-05 DIAGNOSIS — D689 Coagulation defect, unspecified: Secondary | ICD-10-CM | POA: Diagnosis not present

## 2021-10-05 DIAGNOSIS — N186 End stage renal disease: Secondary | ICD-10-CM | POA: Diagnosis not present

## 2021-10-07 DIAGNOSIS — Z992 Dependence on renal dialysis: Secondary | ICD-10-CM | POA: Diagnosis not present

## 2021-10-07 DIAGNOSIS — N186 End stage renal disease: Secondary | ICD-10-CM | POA: Diagnosis not present

## 2021-10-07 DIAGNOSIS — R519 Headache, unspecified: Secondary | ICD-10-CM | POA: Diagnosis not present

## 2021-10-07 DIAGNOSIS — N2581 Secondary hyperparathyroidism of renal origin: Secondary | ICD-10-CM | POA: Diagnosis not present

## 2021-10-07 DIAGNOSIS — D509 Iron deficiency anemia, unspecified: Secondary | ICD-10-CM | POA: Diagnosis not present

## 2021-10-07 DIAGNOSIS — D689 Coagulation defect, unspecified: Secondary | ICD-10-CM | POA: Diagnosis not present

## 2021-10-07 DIAGNOSIS — D631 Anemia in chronic kidney disease: Secondary | ICD-10-CM | POA: Diagnosis not present

## 2021-10-10 DIAGNOSIS — D631 Anemia in chronic kidney disease: Secondary | ICD-10-CM | POA: Diagnosis not present

## 2021-10-10 DIAGNOSIS — D509 Iron deficiency anemia, unspecified: Secondary | ICD-10-CM | POA: Diagnosis not present

## 2021-10-10 DIAGNOSIS — N2581 Secondary hyperparathyroidism of renal origin: Secondary | ICD-10-CM | POA: Diagnosis not present

## 2021-10-10 DIAGNOSIS — D689 Coagulation defect, unspecified: Secondary | ICD-10-CM | POA: Diagnosis not present

## 2021-10-10 DIAGNOSIS — R519 Headache, unspecified: Secondary | ICD-10-CM | POA: Diagnosis not present

## 2021-10-10 DIAGNOSIS — Z992 Dependence on renal dialysis: Secondary | ICD-10-CM | POA: Diagnosis not present

## 2021-10-10 DIAGNOSIS — N186 End stage renal disease: Secondary | ICD-10-CM | POA: Diagnosis not present

## 2021-10-14 DIAGNOSIS — D509 Iron deficiency anemia, unspecified: Secondary | ICD-10-CM | POA: Diagnosis not present

## 2021-10-14 DIAGNOSIS — D689 Coagulation defect, unspecified: Secondary | ICD-10-CM | POA: Diagnosis not present

## 2021-10-14 DIAGNOSIS — R519 Headache, unspecified: Secondary | ICD-10-CM | POA: Diagnosis not present

## 2021-10-14 DIAGNOSIS — N186 End stage renal disease: Secondary | ICD-10-CM | POA: Diagnosis not present

## 2021-10-14 DIAGNOSIS — Z992 Dependence on renal dialysis: Secondary | ICD-10-CM | POA: Diagnosis not present

## 2021-10-14 DIAGNOSIS — N2581 Secondary hyperparathyroidism of renal origin: Secondary | ICD-10-CM | POA: Diagnosis not present

## 2021-10-14 DIAGNOSIS — D631 Anemia in chronic kidney disease: Secondary | ICD-10-CM | POA: Diagnosis not present

## 2021-10-17 DIAGNOSIS — Z992 Dependence on renal dialysis: Secondary | ICD-10-CM | POA: Diagnosis not present

## 2021-10-17 DIAGNOSIS — D631 Anemia in chronic kidney disease: Secondary | ICD-10-CM | POA: Diagnosis not present

## 2021-10-17 DIAGNOSIS — N186 End stage renal disease: Secondary | ICD-10-CM | POA: Diagnosis not present

## 2021-10-17 DIAGNOSIS — R519 Headache, unspecified: Secondary | ICD-10-CM | POA: Diagnosis not present

## 2021-10-17 DIAGNOSIS — D509 Iron deficiency anemia, unspecified: Secondary | ICD-10-CM | POA: Diagnosis not present

## 2021-10-17 DIAGNOSIS — D689 Coagulation defect, unspecified: Secondary | ICD-10-CM | POA: Diagnosis not present

## 2021-10-17 DIAGNOSIS — N2581 Secondary hyperparathyroidism of renal origin: Secondary | ICD-10-CM | POA: Diagnosis not present

## 2021-10-18 ENCOUNTER — Other Ambulatory Visit: Payer: Self-pay | Admitting: Internal Medicine

## 2021-10-18 DIAGNOSIS — I6522 Occlusion and stenosis of left carotid artery: Secondary | ICD-10-CM

## 2021-10-19 DIAGNOSIS — N186 End stage renal disease: Secondary | ICD-10-CM | POA: Diagnosis not present

## 2021-10-19 DIAGNOSIS — D631 Anemia in chronic kidney disease: Secondary | ICD-10-CM | POA: Diagnosis not present

## 2021-10-19 DIAGNOSIS — D509 Iron deficiency anemia, unspecified: Secondary | ICD-10-CM | POA: Diagnosis not present

## 2021-10-19 DIAGNOSIS — D689 Coagulation defect, unspecified: Secondary | ICD-10-CM | POA: Diagnosis not present

## 2021-10-19 DIAGNOSIS — N2581 Secondary hyperparathyroidism of renal origin: Secondary | ICD-10-CM | POA: Diagnosis not present

## 2021-10-19 DIAGNOSIS — Z992 Dependence on renal dialysis: Secondary | ICD-10-CM | POA: Diagnosis not present

## 2021-10-19 DIAGNOSIS — R519 Headache, unspecified: Secondary | ICD-10-CM | POA: Diagnosis not present

## 2021-10-21 DIAGNOSIS — D631 Anemia in chronic kidney disease: Secondary | ICD-10-CM | POA: Diagnosis not present

## 2021-10-21 DIAGNOSIS — D689 Coagulation defect, unspecified: Secondary | ICD-10-CM | POA: Diagnosis not present

## 2021-10-21 DIAGNOSIS — D509 Iron deficiency anemia, unspecified: Secondary | ICD-10-CM | POA: Diagnosis not present

## 2021-10-21 DIAGNOSIS — N186 End stage renal disease: Secondary | ICD-10-CM | POA: Diagnosis not present

## 2021-10-21 DIAGNOSIS — Z992 Dependence on renal dialysis: Secondary | ICD-10-CM | POA: Diagnosis not present

## 2021-10-21 DIAGNOSIS — N2581 Secondary hyperparathyroidism of renal origin: Secondary | ICD-10-CM | POA: Diagnosis not present

## 2021-10-21 DIAGNOSIS — R519 Headache, unspecified: Secondary | ICD-10-CM | POA: Diagnosis not present

## 2021-10-25 DIAGNOSIS — D689 Coagulation defect, unspecified: Secondary | ICD-10-CM | POA: Diagnosis not present

## 2021-10-25 DIAGNOSIS — N186 End stage renal disease: Secondary | ICD-10-CM | POA: Diagnosis not present

## 2021-10-25 DIAGNOSIS — N2581 Secondary hyperparathyroidism of renal origin: Secondary | ICD-10-CM | POA: Diagnosis not present

## 2021-10-25 DIAGNOSIS — D509 Iron deficiency anemia, unspecified: Secondary | ICD-10-CM | POA: Diagnosis not present

## 2021-10-25 DIAGNOSIS — Z992 Dependence on renal dialysis: Secondary | ICD-10-CM | POA: Diagnosis not present

## 2021-10-25 DIAGNOSIS — D631 Anemia in chronic kidney disease: Secondary | ICD-10-CM | POA: Diagnosis not present

## 2021-10-25 DIAGNOSIS — R519 Headache, unspecified: Secondary | ICD-10-CM | POA: Diagnosis not present

## 2021-10-26 ENCOUNTER — Telehealth: Payer: Self-pay | Admitting: Internal Medicine

## 2021-10-26 NOTE — Chronic Care Management (AMB) (Signed)
  Chronic Care Management   Outreach Note  10/26/2021 Name: Morrisa Aldaba MRN: [id number] DOB: 1946-02-04  Referred by: Lavera Guise, MD Reason for referral : No chief complaint on file.   An unsuccessful telephone outreach was attempted today. The patient was referred to the pharmacist for assistance with care management and care coordination.   Follow Up Plan:   Tatjana Dellinger Upstream Scheduler

## 2021-10-28 DIAGNOSIS — N2581 Secondary hyperparathyroidism of renal origin: Secondary | ICD-10-CM | POA: Diagnosis not present

## 2021-10-28 DIAGNOSIS — D631 Anemia in chronic kidney disease: Secondary | ICD-10-CM | POA: Diagnosis not present

## 2021-10-28 DIAGNOSIS — N186 End stage renal disease: Secondary | ICD-10-CM | POA: Diagnosis not present

## 2021-10-28 DIAGNOSIS — R519 Headache, unspecified: Secondary | ICD-10-CM | POA: Diagnosis not present

## 2021-10-28 DIAGNOSIS — D509 Iron deficiency anemia, unspecified: Secondary | ICD-10-CM | POA: Diagnosis not present

## 2021-10-28 DIAGNOSIS — D689 Coagulation defect, unspecified: Secondary | ICD-10-CM | POA: Diagnosis not present

## 2021-10-28 DIAGNOSIS — Z992 Dependence on renal dialysis: Secondary | ICD-10-CM | POA: Diagnosis not present

## 2021-10-29 DIAGNOSIS — R519 Headache, unspecified: Secondary | ICD-10-CM | POA: Diagnosis not present

## 2021-10-29 DIAGNOSIS — D509 Iron deficiency anemia, unspecified: Secondary | ICD-10-CM | POA: Diagnosis not present

## 2021-10-29 DIAGNOSIS — Z992 Dependence on renal dialysis: Secondary | ICD-10-CM | POA: Diagnosis not present

## 2021-10-29 DIAGNOSIS — D631 Anemia in chronic kidney disease: Secondary | ICD-10-CM | POA: Diagnosis not present

## 2021-10-29 DIAGNOSIS — N2581 Secondary hyperparathyroidism of renal origin: Secondary | ICD-10-CM | POA: Diagnosis not present

## 2021-10-29 DIAGNOSIS — N186 End stage renal disease: Secondary | ICD-10-CM | POA: Diagnosis not present

## 2021-10-29 DIAGNOSIS — D689 Coagulation defect, unspecified: Secondary | ICD-10-CM | POA: Diagnosis not present

## 2021-10-31 ENCOUNTER — Telehealth: Payer: Self-pay | Admitting: Internal Medicine

## 2021-10-31 ENCOUNTER — Other Ambulatory Visit: Payer: Self-pay | Admitting: Internal Medicine

## 2021-10-31 DIAGNOSIS — R519 Headache, unspecified: Secondary | ICD-10-CM | POA: Diagnosis not present

## 2021-10-31 DIAGNOSIS — N186 End stage renal disease: Secondary | ICD-10-CM | POA: Diagnosis not present

## 2021-10-31 DIAGNOSIS — D689 Coagulation defect, unspecified: Secondary | ICD-10-CM | POA: Diagnosis not present

## 2021-10-31 DIAGNOSIS — Z992 Dependence on renal dialysis: Secondary | ICD-10-CM | POA: Diagnosis not present

## 2021-10-31 DIAGNOSIS — D631 Anemia in chronic kidney disease: Secondary | ICD-10-CM | POA: Diagnosis not present

## 2021-10-31 DIAGNOSIS — D509 Iron deficiency anemia, unspecified: Secondary | ICD-10-CM | POA: Diagnosis not present

## 2021-10-31 DIAGNOSIS — N2581 Secondary hyperparathyroidism of renal origin: Secondary | ICD-10-CM | POA: Diagnosis not present

## 2021-10-31 NOTE — Chronic Care Management (AMB) (Signed)
  Chronic Care Management   Outreach Note  10/31/2021 Name: Kristen Francis MRN: [id number] DOB: August 02, 1946  Referred by: Lavera Guise, MD Reason for referral : No chief complaint on file.   A second unsuccessful telephone outreach was attempted today. The patient was referred to pharmacist for assistance with care management and care coordination.  Follow Up Plan:   Tatjana Dellinger Upstream Scheduler

## 2021-11-02 ENCOUNTER — Other Ambulatory Visit: Payer: Self-pay | Admitting: Internal Medicine

## 2021-11-02 DIAGNOSIS — K219 Gastro-esophageal reflux disease without esophagitis: Secondary | ICD-10-CM

## 2021-11-02 DIAGNOSIS — D509 Iron deficiency anemia, unspecified: Secondary | ICD-10-CM | POA: Diagnosis not present

## 2021-11-02 DIAGNOSIS — N2581 Secondary hyperparathyroidism of renal origin: Secondary | ICD-10-CM | POA: Diagnosis not present

## 2021-11-02 DIAGNOSIS — N040 Nephrotic syndrome with minor glomerular abnormality: Secondary | ICD-10-CM | POA: Diagnosis not present

## 2021-11-02 DIAGNOSIS — N186 End stage renal disease: Secondary | ICD-10-CM | POA: Diagnosis not present

## 2021-11-02 DIAGNOSIS — D689 Coagulation defect, unspecified: Secondary | ICD-10-CM | POA: Diagnosis not present

## 2021-11-02 DIAGNOSIS — D631 Anemia in chronic kidney disease: Secondary | ICD-10-CM | POA: Diagnosis not present

## 2021-11-02 DIAGNOSIS — R519 Headache, unspecified: Secondary | ICD-10-CM | POA: Diagnosis not present

## 2021-11-02 DIAGNOSIS — Z992 Dependence on renal dialysis: Secondary | ICD-10-CM | POA: Diagnosis not present

## 2021-11-04 DIAGNOSIS — N2581 Secondary hyperparathyroidism of renal origin: Secondary | ICD-10-CM | POA: Diagnosis not present

## 2021-11-04 DIAGNOSIS — Z992 Dependence on renal dialysis: Secondary | ICD-10-CM | POA: Diagnosis not present

## 2021-11-04 DIAGNOSIS — N186 End stage renal disease: Secondary | ICD-10-CM | POA: Diagnosis not present

## 2021-11-04 DIAGNOSIS — D509 Iron deficiency anemia, unspecified: Secondary | ICD-10-CM | POA: Diagnosis not present

## 2021-11-04 DIAGNOSIS — D689 Coagulation defect, unspecified: Secondary | ICD-10-CM | POA: Diagnosis not present

## 2021-11-04 DIAGNOSIS — D631 Anemia in chronic kidney disease: Secondary | ICD-10-CM | POA: Diagnosis not present

## 2021-11-04 DIAGNOSIS — R519 Headache, unspecified: Secondary | ICD-10-CM | POA: Diagnosis not present

## 2021-11-07 ENCOUNTER — Telehealth: Payer: Self-pay | Admitting: Internal Medicine

## 2021-11-07 DIAGNOSIS — D509 Iron deficiency anemia, unspecified: Secondary | ICD-10-CM | POA: Diagnosis not present

## 2021-11-07 DIAGNOSIS — R519 Headache, unspecified: Secondary | ICD-10-CM | POA: Diagnosis not present

## 2021-11-07 DIAGNOSIS — D689 Coagulation defect, unspecified: Secondary | ICD-10-CM | POA: Diagnosis not present

## 2021-11-07 DIAGNOSIS — N186 End stage renal disease: Secondary | ICD-10-CM | POA: Diagnosis not present

## 2021-11-07 DIAGNOSIS — D631 Anemia in chronic kidney disease: Secondary | ICD-10-CM | POA: Diagnosis not present

## 2021-11-07 DIAGNOSIS — Z992 Dependence on renal dialysis: Secondary | ICD-10-CM | POA: Diagnosis not present

## 2021-11-07 DIAGNOSIS — N2581 Secondary hyperparathyroidism of renal origin: Secondary | ICD-10-CM | POA: Diagnosis not present

## 2021-11-07 NOTE — Progress Notes (Signed)
  Chronic Care Management   Outreach Note  11/07/2021 Name: Kristen Francis MRN: [id number] DOB: May 27, 1946  Referred by: Lavera Guise, MD Reason for referral : No chief complaint on file.   Third unsuccessful telephone outreach was attempted today. The patient was referred to the pharmacist for assistance with care management and care coordination.   Follow Up Plan:   Tatjana Dellinger Upstream Scheduler

## 2021-11-09 DIAGNOSIS — D631 Anemia in chronic kidney disease: Secondary | ICD-10-CM | POA: Diagnosis not present

## 2021-11-09 DIAGNOSIS — N186 End stage renal disease: Secondary | ICD-10-CM | POA: Diagnosis not present

## 2021-11-09 DIAGNOSIS — Z992 Dependence on renal dialysis: Secondary | ICD-10-CM | POA: Diagnosis not present

## 2021-11-09 DIAGNOSIS — N2581 Secondary hyperparathyroidism of renal origin: Secondary | ICD-10-CM | POA: Diagnosis not present

## 2021-11-09 DIAGNOSIS — D689 Coagulation defect, unspecified: Secondary | ICD-10-CM | POA: Diagnosis not present

## 2021-11-09 DIAGNOSIS — R519 Headache, unspecified: Secondary | ICD-10-CM | POA: Diagnosis not present

## 2021-11-09 DIAGNOSIS — D509 Iron deficiency anemia, unspecified: Secondary | ICD-10-CM | POA: Diagnosis not present

## 2021-11-11 DIAGNOSIS — D631 Anemia in chronic kidney disease: Secondary | ICD-10-CM | POA: Diagnosis not present

## 2021-11-11 DIAGNOSIS — D509 Iron deficiency anemia, unspecified: Secondary | ICD-10-CM | POA: Diagnosis not present

## 2021-11-11 DIAGNOSIS — D689 Coagulation defect, unspecified: Secondary | ICD-10-CM | POA: Diagnosis not present

## 2021-11-11 DIAGNOSIS — N186 End stage renal disease: Secondary | ICD-10-CM | POA: Diagnosis not present

## 2021-11-11 DIAGNOSIS — Z992 Dependence on renal dialysis: Secondary | ICD-10-CM | POA: Diagnosis not present

## 2021-11-11 DIAGNOSIS — R519 Headache, unspecified: Secondary | ICD-10-CM | POA: Diagnosis not present

## 2021-11-11 DIAGNOSIS — N2581 Secondary hyperparathyroidism of renal origin: Secondary | ICD-10-CM | POA: Diagnosis not present

## 2021-11-12 ENCOUNTER — Other Ambulatory Visit: Payer: Self-pay | Admitting: Physician Assistant

## 2021-11-12 DIAGNOSIS — I10 Essential (primary) hypertension: Secondary | ICD-10-CM

## 2021-11-14 DIAGNOSIS — N2581 Secondary hyperparathyroidism of renal origin: Secondary | ICD-10-CM | POA: Diagnosis not present

## 2021-11-14 DIAGNOSIS — D509 Iron deficiency anemia, unspecified: Secondary | ICD-10-CM | POA: Diagnosis not present

## 2021-11-14 DIAGNOSIS — D631 Anemia in chronic kidney disease: Secondary | ICD-10-CM | POA: Diagnosis not present

## 2021-11-14 DIAGNOSIS — D689 Coagulation defect, unspecified: Secondary | ICD-10-CM | POA: Diagnosis not present

## 2021-11-14 DIAGNOSIS — Z992 Dependence on renal dialysis: Secondary | ICD-10-CM | POA: Diagnosis not present

## 2021-11-14 DIAGNOSIS — N186 End stage renal disease: Secondary | ICD-10-CM | POA: Diagnosis not present

## 2021-11-14 DIAGNOSIS — R519 Headache, unspecified: Secondary | ICD-10-CM | POA: Diagnosis not present

## 2021-11-15 DIAGNOSIS — N186 End stage renal disease: Secondary | ICD-10-CM | POA: Diagnosis not present

## 2021-11-15 DIAGNOSIS — Z992 Dependence on renal dialysis: Secondary | ICD-10-CM | POA: Diagnosis not present

## 2021-11-15 DIAGNOSIS — R519 Headache, unspecified: Secondary | ICD-10-CM | POA: Diagnosis not present

## 2021-11-15 DIAGNOSIS — N2581 Secondary hyperparathyroidism of renal origin: Secondary | ICD-10-CM | POA: Diagnosis not present

## 2021-11-15 DIAGNOSIS — D631 Anemia in chronic kidney disease: Secondary | ICD-10-CM | POA: Diagnosis not present

## 2021-11-15 DIAGNOSIS — D509 Iron deficiency anemia, unspecified: Secondary | ICD-10-CM | POA: Diagnosis not present

## 2021-11-15 DIAGNOSIS — D689 Coagulation defect, unspecified: Secondary | ICD-10-CM | POA: Diagnosis not present

## 2021-11-16 DIAGNOSIS — N2581 Secondary hyperparathyroidism of renal origin: Secondary | ICD-10-CM | POA: Diagnosis not present

## 2021-11-16 DIAGNOSIS — N186 End stage renal disease: Secondary | ICD-10-CM | POA: Diagnosis not present

## 2021-11-16 DIAGNOSIS — D631 Anemia in chronic kidney disease: Secondary | ICD-10-CM | POA: Diagnosis not present

## 2021-11-16 DIAGNOSIS — D509 Iron deficiency anemia, unspecified: Secondary | ICD-10-CM | POA: Diagnosis not present

## 2021-11-16 DIAGNOSIS — Z992 Dependence on renal dialysis: Secondary | ICD-10-CM | POA: Diagnosis not present

## 2021-11-16 DIAGNOSIS — D689 Coagulation defect, unspecified: Secondary | ICD-10-CM | POA: Diagnosis not present

## 2021-11-16 DIAGNOSIS — R519 Headache, unspecified: Secondary | ICD-10-CM | POA: Diagnosis not present

## 2021-11-16 IMAGING — MR MR HEAD W/O CM
8 series · 48 of 48 positions shown · non-contrast
Comparison: None.

CLINICAL DATA: Right-sided numbness and tingling

EXAM:
MRI HEAD WITHOUT CONTRAST
TECHNIQUE: Multiplanar, multiecho pulse sequences of the brain and surrounding
structures were obtained without intravenous contrast.

[Series 14: ax dwi_tracew · axial · 3.0mm · 0.71mm/px · z∈[-53,+92]mm · 5 of 50 slices shown]
[im 1/50]
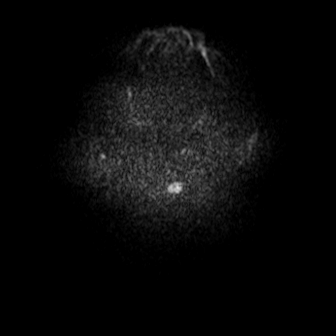
[im 13/50]
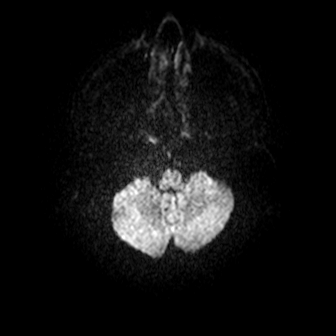
[im 25/50]
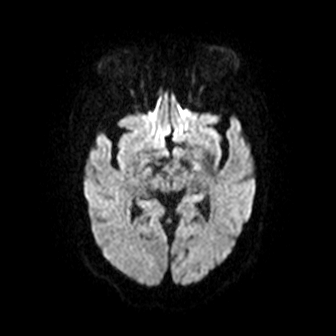
[im 37/50]
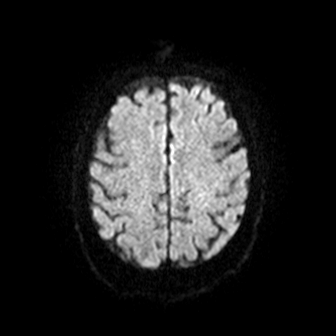
[im 50/50]
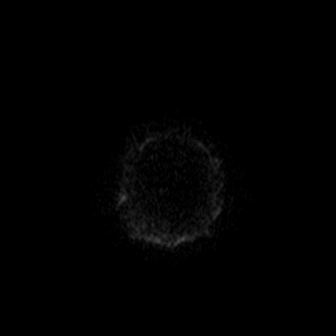

[Series 15: ax dwi_adc · axial · 3.0mm · 0.71mm/px · z∈[-53,+92]mm · 6 of 50 slices shown]
[im 1/50]
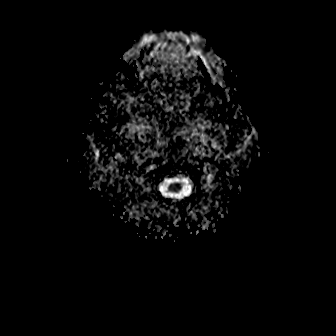
[im 10/50]
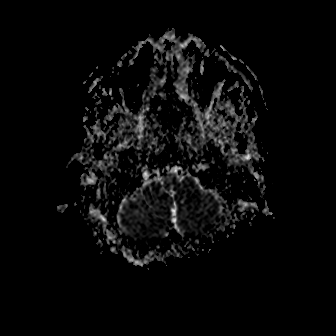
[im 20/50]
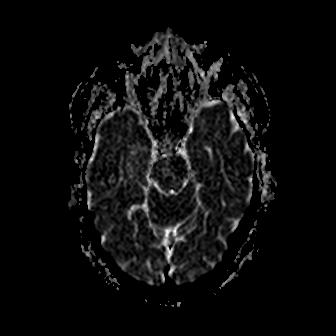
[im 30/50]
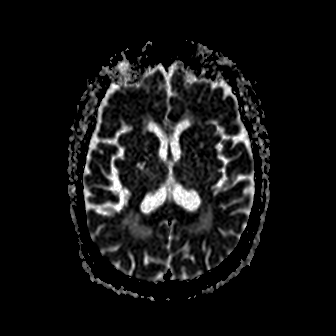
[im 40/50]
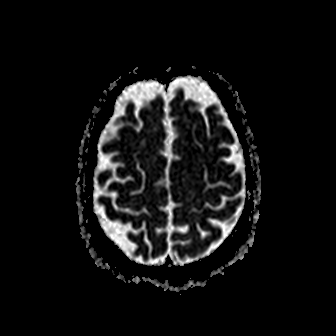
[im 50/50]
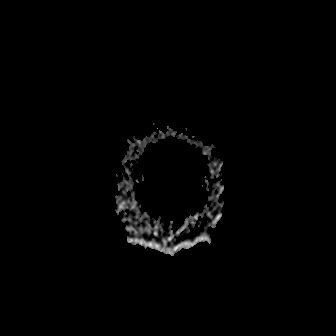

[Series 16: cor dwi_tracew · coronal · 5.0mm · 0.68mm/px · 4 of 34 slices shown]
[im 1/34]
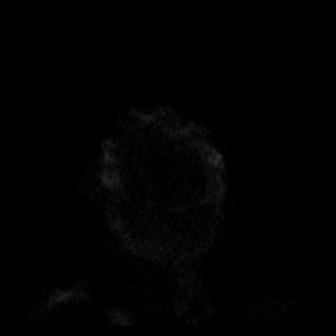
[im 12/34]
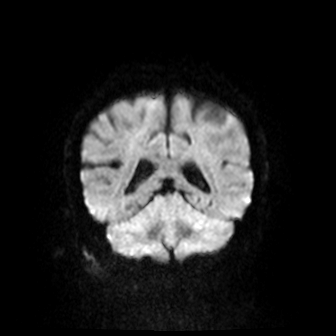
[im 23/34]
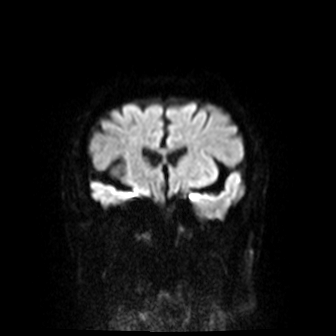
[im 34/34]
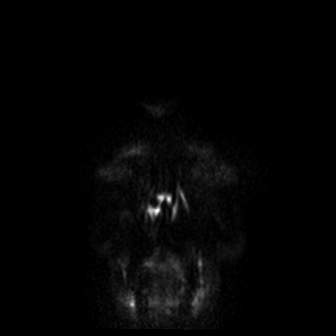

[Series 19: T2 · axial · 5.0mm · 0.86mm/px · z∈[-51,+91]mm · 3 of 25 slices shown (1 of 2)]
[im 1/25]
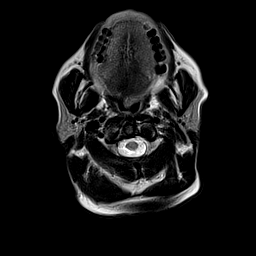
[im 13/25]
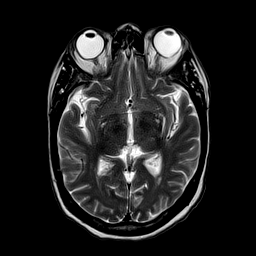
[im 25/25]
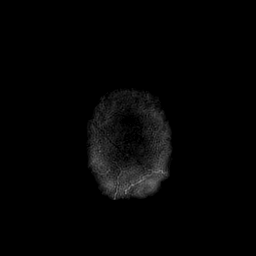

[Series 22: swi_images · axial · 3.0mm · 0.90mm/px · z∈[-48,+91]mm · 5 of 48 slices shown]
[im 1/48]
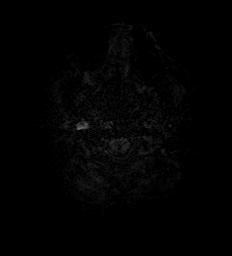
[im 12/48]
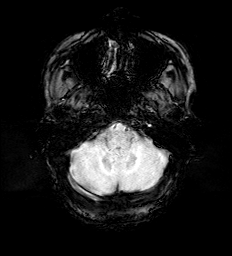
[im 24/48]
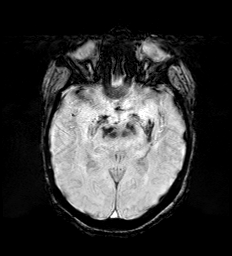
[im 36/48]
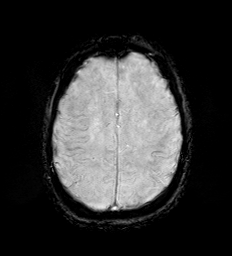
[im 48/48]
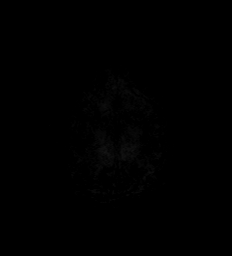

[Series 24: FLAIR · axial · 3.0mm · 0.69mm/px · z∈[-52,+93]mm · 6 of 50 slices shown]
[im 1/50]
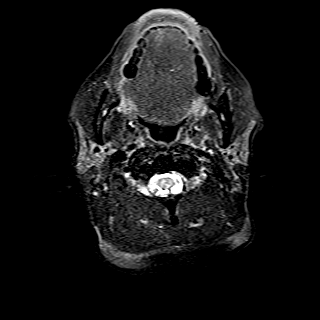
[im 10/50]
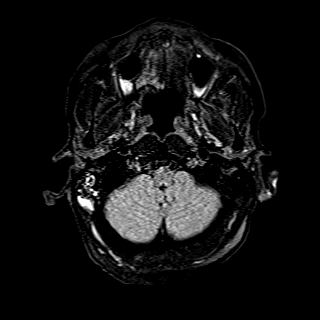
[im 20/50]
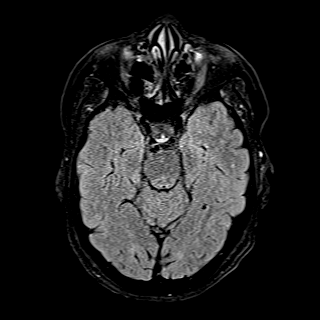
[im 30/50]
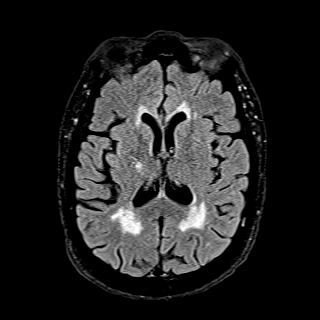
[im 40/50]
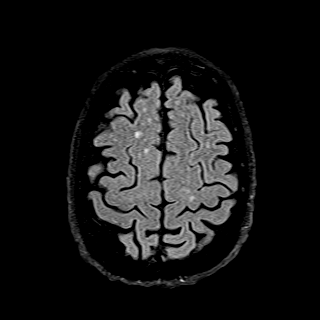
[im 50/50]
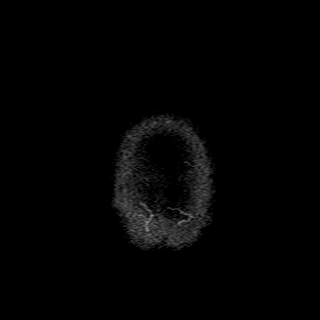

[Series 25: T1 · axial · 1.0mm · 0.98mm/px · z∈[-48,+93]mm · 16 of 144 slices shown]
[im 1/144]
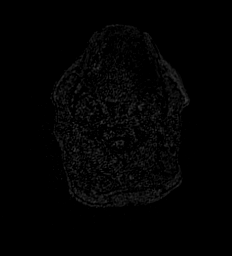
[im 10/144]
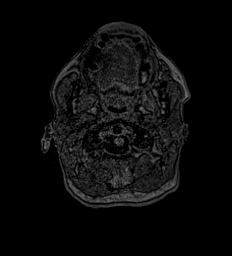
[im 20/144]
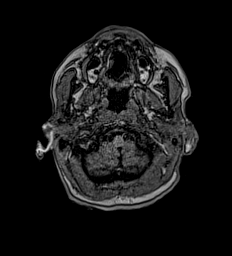
[im 29/144]
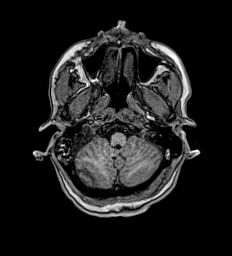
[im 39/144]
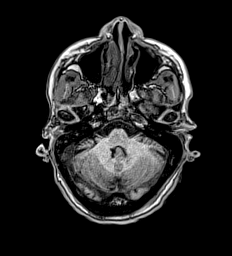
[im 48/144]
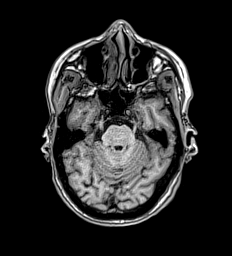
[im 58/144]
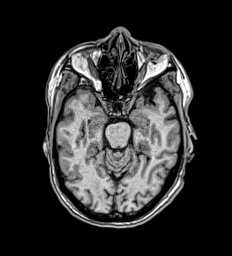
[im 67/144]
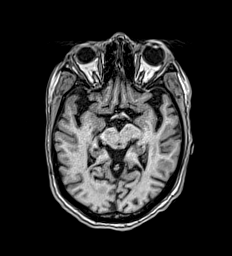
[im 77/144]
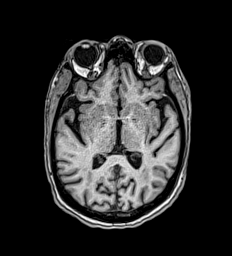
[im 86/144]
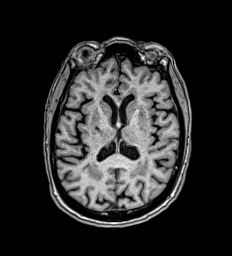
[im 96/144]
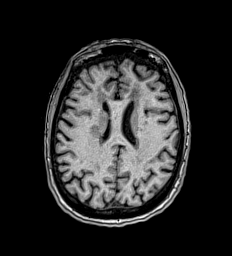
[im 105/144]
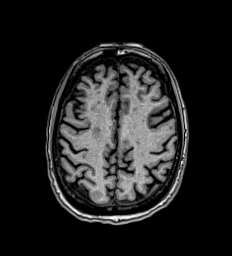
[im 115/144]
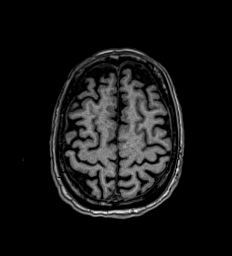
[im 124/144]
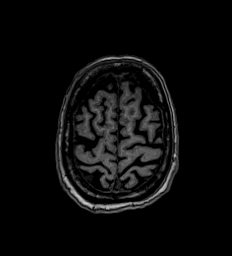
[im 134/144]
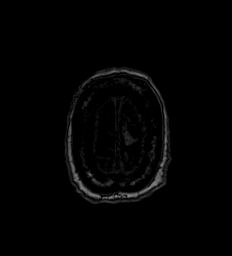
[im 144/144]
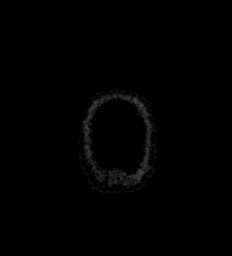

[Series 26: T2 · coronal · 5.0mm · 0.86mm/px · 3 of 27 slices shown (2 of 2)]
[im 1/27]
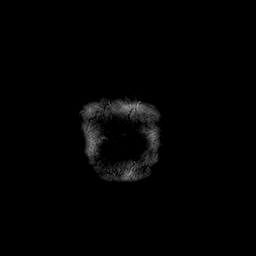
[im 14/27]
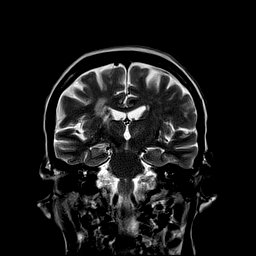
[im 27/27]
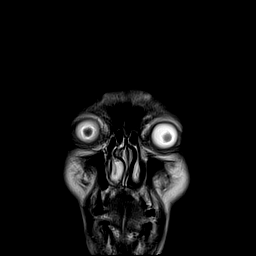

[48 of 48 positions shown; findings below may reference images not displayed]

FINDINGS: Brain: No acute infarct, mass effect or extra-axial collection. No
acute or chronic hemorrhage. There is multifocal hyperintense
T2-weighted signal within the white matter. Parenchymal volume and
CSF spaces are normal. Old small vessel infarct of the right basal
ganglia. The midline structures are normal.

Vascular: Major flow voids are preserved.

Skull and upper cervical spine: Normal calvarium and skull base.
Visualized upper cervical spine and soft tissues are normal.

Sinuses/Orbits:No paranasal sinus fluid levels or advanced mucosal
thickening. Small amount of right mastoid fluid. Normal orbits.
IMPRESSION: 1. No acute intracranial abnormality.
2. Findings of chronic ischemic microangiopathy.

## 2021-11-18 DIAGNOSIS — D689 Coagulation defect, unspecified: Secondary | ICD-10-CM | POA: Diagnosis not present

## 2021-11-18 DIAGNOSIS — D631 Anemia in chronic kidney disease: Secondary | ICD-10-CM | POA: Diagnosis not present

## 2021-11-18 DIAGNOSIS — D509 Iron deficiency anemia, unspecified: Secondary | ICD-10-CM | POA: Diagnosis not present

## 2021-11-18 DIAGNOSIS — Z992 Dependence on renal dialysis: Secondary | ICD-10-CM | POA: Diagnosis not present

## 2021-11-18 DIAGNOSIS — N2581 Secondary hyperparathyroidism of renal origin: Secondary | ICD-10-CM | POA: Diagnosis not present

## 2021-11-18 DIAGNOSIS — R519 Headache, unspecified: Secondary | ICD-10-CM | POA: Diagnosis not present

## 2021-11-18 DIAGNOSIS — N186 End stage renal disease: Secondary | ICD-10-CM | POA: Diagnosis not present

## 2021-11-21 DIAGNOSIS — Z992 Dependence on renal dialysis: Secondary | ICD-10-CM | POA: Diagnosis not present

## 2021-11-21 DIAGNOSIS — D631 Anemia in chronic kidney disease: Secondary | ICD-10-CM | POA: Diagnosis not present

## 2021-11-21 DIAGNOSIS — N186 End stage renal disease: Secondary | ICD-10-CM | POA: Diagnosis not present

## 2021-11-21 DIAGNOSIS — D509 Iron deficiency anemia, unspecified: Secondary | ICD-10-CM | POA: Diagnosis not present

## 2021-11-21 DIAGNOSIS — R519 Headache, unspecified: Secondary | ICD-10-CM | POA: Diagnosis not present

## 2021-11-21 DIAGNOSIS — N2581 Secondary hyperparathyroidism of renal origin: Secondary | ICD-10-CM | POA: Diagnosis not present

## 2021-11-21 DIAGNOSIS — D689 Coagulation defect, unspecified: Secondary | ICD-10-CM | POA: Diagnosis not present

## 2021-11-25 DIAGNOSIS — D689 Coagulation defect, unspecified: Secondary | ICD-10-CM | POA: Diagnosis not present

## 2021-11-25 DIAGNOSIS — D509 Iron deficiency anemia, unspecified: Secondary | ICD-10-CM | POA: Diagnosis not present

## 2021-11-25 DIAGNOSIS — R519 Headache, unspecified: Secondary | ICD-10-CM | POA: Diagnosis not present

## 2021-11-25 DIAGNOSIS — D631 Anemia in chronic kidney disease: Secondary | ICD-10-CM | POA: Diagnosis not present

## 2021-11-25 DIAGNOSIS — N186 End stage renal disease: Secondary | ICD-10-CM | POA: Diagnosis not present

## 2021-11-25 DIAGNOSIS — N2581 Secondary hyperparathyroidism of renal origin: Secondary | ICD-10-CM | POA: Diagnosis not present

## 2021-11-25 DIAGNOSIS — Z992 Dependence on renal dialysis: Secondary | ICD-10-CM | POA: Diagnosis not present

## 2021-11-28 DIAGNOSIS — N2581 Secondary hyperparathyroidism of renal origin: Secondary | ICD-10-CM | POA: Diagnosis not present

## 2021-11-28 DIAGNOSIS — N186 End stage renal disease: Secondary | ICD-10-CM | POA: Diagnosis not present

## 2021-11-28 DIAGNOSIS — D509 Iron deficiency anemia, unspecified: Secondary | ICD-10-CM | POA: Diagnosis not present

## 2021-11-28 DIAGNOSIS — Z992 Dependence on renal dialysis: Secondary | ICD-10-CM | POA: Diagnosis not present

## 2021-11-28 DIAGNOSIS — D631 Anemia in chronic kidney disease: Secondary | ICD-10-CM | POA: Diagnosis not present

## 2021-11-28 DIAGNOSIS — R519 Headache, unspecified: Secondary | ICD-10-CM | POA: Diagnosis not present

## 2021-11-28 DIAGNOSIS — D689 Coagulation defect, unspecified: Secondary | ICD-10-CM | POA: Diagnosis not present

## 2021-11-30 DIAGNOSIS — N2581 Secondary hyperparathyroidism of renal origin: Secondary | ICD-10-CM | POA: Diagnosis not present

## 2021-11-30 DIAGNOSIS — Z992 Dependence on renal dialysis: Secondary | ICD-10-CM | POA: Diagnosis not present

## 2021-11-30 DIAGNOSIS — R519 Headache, unspecified: Secondary | ICD-10-CM | POA: Diagnosis not present

## 2021-11-30 DIAGNOSIS — D509 Iron deficiency anemia, unspecified: Secondary | ICD-10-CM | POA: Diagnosis not present

## 2021-11-30 DIAGNOSIS — D631 Anemia in chronic kidney disease: Secondary | ICD-10-CM | POA: Diagnosis not present

## 2021-11-30 DIAGNOSIS — D689 Coagulation defect, unspecified: Secondary | ICD-10-CM | POA: Diagnosis not present

## 2021-11-30 DIAGNOSIS — N186 End stage renal disease: Secondary | ICD-10-CM | POA: Diagnosis not present

## 2021-12-02 DIAGNOSIS — R519 Headache, unspecified: Secondary | ICD-10-CM | POA: Diagnosis not present

## 2021-12-02 DIAGNOSIS — N186 End stage renal disease: Secondary | ICD-10-CM | POA: Diagnosis not present

## 2021-12-02 DIAGNOSIS — Z992 Dependence on renal dialysis: Secondary | ICD-10-CM | POA: Diagnosis not present

## 2021-12-02 DIAGNOSIS — D509 Iron deficiency anemia, unspecified: Secondary | ICD-10-CM | POA: Diagnosis not present

## 2021-12-02 DIAGNOSIS — D689 Coagulation defect, unspecified: Secondary | ICD-10-CM | POA: Diagnosis not present

## 2021-12-02 DIAGNOSIS — D631 Anemia in chronic kidney disease: Secondary | ICD-10-CM | POA: Diagnosis not present

## 2021-12-02 DIAGNOSIS — N2581 Secondary hyperparathyroidism of renal origin: Secondary | ICD-10-CM | POA: Diagnosis not present

## 2021-12-03 DIAGNOSIS — Z992 Dependence on renal dialysis: Secondary | ICD-10-CM | POA: Diagnosis not present

## 2021-12-03 DIAGNOSIS — N186 End stage renal disease: Secondary | ICD-10-CM | POA: Diagnosis not present

## 2021-12-03 DIAGNOSIS — N040 Nephrotic syndrome with minor glomerular abnormality: Secondary | ICD-10-CM | POA: Diagnosis not present

## 2021-12-05 DIAGNOSIS — D689 Coagulation defect, unspecified: Secondary | ICD-10-CM | POA: Diagnosis not present

## 2021-12-05 DIAGNOSIS — N186 End stage renal disease: Secondary | ICD-10-CM | POA: Diagnosis not present

## 2021-12-05 DIAGNOSIS — Z992 Dependence on renal dialysis: Secondary | ICD-10-CM | POA: Diagnosis not present

## 2021-12-05 DIAGNOSIS — D509 Iron deficiency anemia, unspecified: Secondary | ICD-10-CM | POA: Diagnosis not present

## 2021-12-05 DIAGNOSIS — N2581 Secondary hyperparathyroidism of renal origin: Secondary | ICD-10-CM | POA: Diagnosis not present

## 2021-12-07 DIAGNOSIS — Z992 Dependence on renal dialysis: Secondary | ICD-10-CM | POA: Diagnosis not present

## 2021-12-07 DIAGNOSIS — D689 Coagulation defect, unspecified: Secondary | ICD-10-CM | POA: Diagnosis not present

## 2021-12-07 DIAGNOSIS — D509 Iron deficiency anemia, unspecified: Secondary | ICD-10-CM | POA: Diagnosis not present

## 2021-12-07 DIAGNOSIS — N186 End stage renal disease: Secondary | ICD-10-CM | POA: Diagnosis not present

## 2021-12-07 DIAGNOSIS — N2581 Secondary hyperparathyroidism of renal origin: Secondary | ICD-10-CM | POA: Diagnosis not present

## 2021-12-09 DIAGNOSIS — N186 End stage renal disease: Secondary | ICD-10-CM | POA: Diagnosis not present

## 2021-12-09 DIAGNOSIS — Z992 Dependence on renal dialysis: Secondary | ICD-10-CM | POA: Diagnosis not present

## 2021-12-09 DIAGNOSIS — N2581 Secondary hyperparathyroidism of renal origin: Secondary | ICD-10-CM | POA: Diagnosis not present

## 2021-12-09 DIAGNOSIS — D509 Iron deficiency anemia, unspecified: Secondary | ICD-10-CM | POA: Diagnosis not present

## 2021-12-09 DIAGNOSIS — D689 Coagulation defect, unspecified: Secondary | ICD-10-CM | POA: Diagnosis not present

## 2021-12-12 DIAGNOSIS — N2581 Secondary hyperparathyroidism of renal origin: Secondary | ICD-10-CM | POA: Diagnosis not present

## 2021-12-12 DIAGNOSIS — K5909 Other constipation: Secondary | ICD-10-CM | POA: Diagnosis not present

## 2021-12-12 DIAGNOSIS — N186 End stage renal disease: Secondary | ICD-10-CM | POA: Diagnosis not present

## 2021-12-12 DIAGNOSIS — D689 Coagulation defect, unspecified: Secondary | ICD-10-CM | POA: Diagnosis not present

## 2021-12-12 DIAGNOSIS — Z992 Dependence on renal dialysis: Secondary | ICD-10-CM | POA: Diagnosis not present

## 2021-12-12 DIAGNOSIS — Z86010 Personal history of colonic polyps: Secondary | ICD-10-CM | POA: Diagnosis not present

## 2021-12-12 DIAGNOSIS — D509 Iron deficiency anemia, unspecified: Secondary | ICD-10-CM | POA: Diagnosis not present

## 2021-12-12 DIAGNOSIS — D638 Anemia in other chronic diseases classified elsewhere: Secondary | ICD-10-CM | POA: Diagnosis not present

## 2021-12-14 DIAGNOSIS — D509 Iron deficiency anemia, unspecified: Secondary | ICD-10-CM | POA: Diagnosis not present

## 2021-12-14 DIAGNOSIS — N186 End stage renal disease: Secondary | ICD-10-CM | POA: Diagnosis not present

## 2021-12-14 DIAGNOSIS — Z992 Dependence on renal dialysis: Secondary | ICD-10-CM | POA: Diagnosis not present

## 2021-12-14 DIAGNOSIS — D689 Coagulation defect, unspecified: Secondary | ICD-10-CM | POA: Diagnosis not present

## 2021-12-14 DIAGNOSIS — N2581 Secondary hyperparathyroidism of renal origin: Secondary | ICD-10-CM | POA: Diagnosis not present

## 2021-12-16 DIAGNOSIS — Z992 Dependence on renal dialysis: Secondary | ICD-10-CM | POA: Diagnosis not present

## 2021-12-16 DIAGNOSIS — D509 Iron deficiency anemia, unspecified: Secondary | ICD-10-CM | POA: Diagnosis not present

## 2021-12-16 DIAGNOSIS — N186 End stage renal disease: Secondary | ICD-10-CM | POA: Diagnosis not present

## 2021-12-16 DIAGNOSIS — D689 Coagulation defect, unspecified: Secondary | ICD-10-CM | POA: Diagnosis not present

## 2021-12-16 DIAGNOSIS — N2581 Secondary hyperparathyroidism of renal origin: Secondary | ICD-10-CM | POA: Diagnosis not present

## 2021-12-19 DIAGNOSIS — N2581 Secondary hyperparathyroidism of renal origin: Secondary | ICD-10-CM | POA: Diagnosis not present

## 2021-12-19 DIAGNOSIS — N186 End stage renal disease: Secondary | ICD-10-CM | POA: Diagnosis not present

## 2021-12-19 DIAGNOSIS — Z992 Dependence on renal dialysis: Secondary | ICD-10-CM | POA: Diagnosis not present

## 2021-12-19 DIAGNOSIS — D509 Iron deficiency anemia, unspecified: Secondary | ICD-10-CM | POA: Diagnosis not present

## 2021-12-19 DIAGNOSIS — D689 Coagulation defect, unspecified: Secondary | ICD-10-CM | POA: Diagnosis not present

## 2021-12-21 DIAGNOSIS — Z992 Dependence on renal dialysis: Secondary | ICD-10-CM | POA: Diagnosis not present

## 2021-12-21 DIAGNOSIS — N186 End stage renal disease: Secondary | ICD-10-CM | POA: Diagnosis not present

## 2021-12-21 DIAGNOSIS — N2581 Secondary hyperparathyroidism of renal origin: Secondary | ICD-10-CM | POA: Diagnosis not present

## 2021-12-21 DIAGNOSIS — D689 Coagulation defect, unspecified: Secondary | ICD-10-CM | POA: Diagnosis not present

## 2021-12-21 DIAGNOSIS — D509 Iron deficiency anemia, unspecified: Secondary | ICD-10-CM | POA: Diagnosis not present

## 2021-12-23 DIAGNOSIS — Z992 Dependence on renal dialysis: Secondary | ICD-10-CM | POA: Diagnosis not present

## 2021-12-23 DIAGNOSIS — N2581 Secondary hyperparathyroidism of renal origin: Secondary | ICD-10-CM | POA: Diagnosis not present

## 2021-12-23 DIAGNOSIS — D689 Coagulation defect, unspecified: Secondary | ICD-10-CM | POA: Diagnosis not present

## 2021-12-23 DIAGNOSIS — D509 Iron deficiency anemia, unspecified: Secondary | ICD-10-CM | POA: Diagnosis not present

## 2021-12-23 DIAGNOSIS — N186 End stage renal disease: Secondary | ICD-10-CM | POA: Diagnosis not present

## 2021-12-26 DIAGNOSIS — N186 End stage renal disease: Secondary | ICD-10-CM | POA: Diagnosis not present

## 2021-12-26 DIAGNOSIS — D509 Iron deficiency anemia, unspecified: Secondary | ICD-10-CM | POA: Diagnosis not present

## 2021-12-26 DIAGNOSIS — N2581 Secondary hyperparathyroidism of renal origin: Secondary | ICD-10-CM | POA: Diagnosis not present

## 2021-12-26 DIAGNOSIS — Z992 Dependence on renal dialysis: Secondary | ICD-10-CM | POA: Diagnosis not present

## 2021-12-26 DIAGNOSIS — D689 Coagulation defect, unspecified: Secondary | ICD-10-CM | POA: Diagnosis not present

## 2021-12-28 DIAGNOSIS — Z992 Dependence on renal dialysis: Secondary | ICD-10-CM | POA: Diagnosis not present

## 2021-12-28 DIAGNOSIS — D689 Coagulation defect, unspecified: Secondary | ICD-10-CM | POA: Diagnosis not present

## 2021-12-28 DIAGNOSIS — N186 End stage renal disease: Secondary | ICD-10-CM | POA: Diagnosis not present

## 2021-12-28 DIAGNOSIS — D509 Iron deficiency anemia, unspecified: Secondary | ICD-10-CM | POA: Diagnosis not present

## 2021-12-28 DIAGNOSIS — N2581 Secondary hyperparathyroidism of renal origin: Secondary | ICD-10-CM | POA: Diagnosis not present

## 2021-12-30 DIAGNOSIS — N186 End stage renal disease: Secondary | ICD-10-CM | POA: Diagnosis not present

## 2021-12-30 DIAGNOSIS — D509 Iron deficiency anemia, unspecified: Secondary | ICD-10-CM | POA: Diagnosis not present

## 2021-12-30 DIAGNOSIS — N2581 Secondary hyperparathyroidism of renal origin: Secondary | ICD-10-CM | POA: Diagnosis not present

## 2021-12-30 DIAGNOSIS — Z992 Dependence on renal dialysis: Secondary | ICD-10-CM | POA: Diagnosis not present

## 2021-12-30 DIAGNOSIS — D689 Coagulation defect, unspecified: Secondary | ICD-10-CM | POA: Diagnosis not present

## 2022-01-02 DIAGNOSIS — D689 Coagulation defect, unspecified: Secondary | ICD-10-CM | POA: Diagnosis not present

## 2022-01-02 DIAGNOSIS — N2581 Secondary hyperparathyroidism of renal origin: Secondary | ICD-10-CM | POA: Diagnosis not present

## 2022-01-02 DIAGNOSIS — D509 Iron deficiency anemia, unspecified: Secondary | ICD-10-CM | POA: Diagnosis not present

## 2022-01-02 DIAGNOSIS — Z992 Dependence on renal dialysis: Secondary | ICD-10-CM | POA: Diagnosis not present

## 2022-01-02 DIAGNOSIS — N186 End stage renal disease: Secondary | ICD-10-CM | POA: Diagnosis not present

## 2022-01-04 DIAGNOSIS — D689 Coagulation defect, unspecified: Secondary | ICD-10-CM | POA: Diagnosis not present

## 2022-01-04 DIAGNOSIS — Z992 Dependence on renal dialysis: Secondary | ICD-10-CM | POA: Diagnosis not present

## 2022-01-04 DIAGNOSIS — N2581 Secondary hyperparathyroidism of renal origin: Secondary | ICD-10-CM | POA: Diagnosis not present

## 2022-01-04 DIAGNOSIS — D631 Anemia in chronic kidney disease: Secondary | ICD-10-CM | POA: Diagnosis not present

## 2022-01-04 DIAGNOSIS — R52 Pain, unspecified: Secondary | ICD-10-CM | POA: Diagnosis not present

## 2022-01-04 DIAGNOSIS — N186 End stage renal disease: Secondary | ICD-10-CM | POA: Diagnosis not present

## 2022-01-06 DIAGNOSIS — N2581 Secondary hyperparathyroidism of renal origin: Secondary | ICD-10-CM | POA: Diagnosis not present

## 2022-01-06 DIAGNOSIS — D631 Anemia in chronic kidney disease: Secondary | ICD-10-CM | POA: Diagnosis not present

## 2022-01-06 DIAGNOSIS — D689 Coagulation defect, unspecified: Secondary | ICD-10-CM | POA: Diagnosis not present

## 2022-01-06 DIAGNOSIS — N186 End stage renal disease: Secondary | ICD-10-CM | POA: Diagnosis not present

## 2022-01-06 DIAGNOSIS — Z992 Dependence on renal dialysis: Secondary | ICD-10-CM | POA: Diagnosis not present

## 2022-01-06 DIAGNOSIS — R52 Pain, unspecified: Secondary | ICD-10-CM | POA: Diagnosis not present

## 2022-01-09 DIAGNOSIS — R52 Pain, unspecified: Secondary | ICD-10-CM | POA: Diagnosis not present

## 2022-01-09 DIAGNOSIS — Z992 Dependence on renal dialysis: Secondary | ICD-10-CM | POA: Diagnosis not present

## 2022-01-09 DIAGNOSIS — D689 Coagulation defect, unspecified: Secondary | ICD-10-CM | POA: Diagnosis not present

## 2022-01-09 DIAGNOSIS — D631 Anemia in chronic kidney disease: Secondary | ICD-10-CM | POA: Diagnosis not present

## 2022-01-09 DIAGNOSIS — N186 End stage renal disease: Secondary | ICD-10-CM | POA: Diagnosis not present

## 2022-01-09 DIAGNOSIS — N2581 Secondary hyperparathyroidism of renal origin: Secondary | ICD-10-CM | POA: Diagnosis not present

## 2022-01-11 DIAGNOSIS — D631 Anemia in chronic kidney disease: Secondary | ICD-10-CM | POA: Diagnosis not present

## 2022-01-11 DIAGNOSIS — N186 End stage renal disease: Secondary | ICD-10-CM | POA: Diagnosis not present

## 2022-01-11 DIAGNOSIS — Z992 Dependence on renal dialysis: Secondary | ICD-10-CM | POA: Diagnosis not present

## 2022-01-11 DIAGNOSIS — R52 Pain, unspecified: Secondary | ICD-10-CM | POA: Diagnosis not present

## 2022-01-11 DIAGNOSIS — D689 Coagulation defect, unspecified: Secondary | ICD-10-CM | POA: Diagnosis not present

## 2022-01-11 DIAGNOSIS — N2581 Secondary hyperparathyroidism of renal origin: Secondary | ICD-10-CM | POA: Diagnosis not present

## 2022-01-13 DIAGNOSIS — Z992 Dependence on renal dialysis: Secondary | ICD-10-CM | POA: Diagnosis not present

## 2022-01-13 DIAGNOSIS — D689 Coagulation defect, unspecified: Secondary | ICD-10-CM | POA: Diagnosis not present

## 2022-01-13 DIAGNOSIS — R52 Pain, unspecified: Secondary | ICD-10-CM | POA: Diagnosis not present

## 2022-01-13 DIAGNOSIS — N2581 Secondary hyperparathyroidism of renal origin: Secondary | ICD-10-CM | POA: Diagnosis not present

## 2022-01-13 DIAGNOSIS — D631 Anemia in chronic kidney disease: Secondary | ICD-10-CM | POA: Diagnosis not present

## 2022-01-13 DIAGNOSIS — N186 End stage renal disease: Secondary | ICD-10-CM | POA: Diagnosis not present

## 2022-01-16 DIAGNOSIS — N186 End stage renal disease: Secondary | ICD-10-CM | POA: Diagnosis not present

## 2022-01-16 DIAGNOSIS — D689 Coagulation defect, unspecified: Secondary | ICD-10-CM | POA: Diagnosis not present

## 2022-01-16 DIAGNOSIS — D631 Anemia in chronic kidney disease: Secondary | ICD-10-CM | POA: Diagnosis not present

## 2022-01-16 DIAGNOSIS — N2581 Secondary hyperparathyroidism of renal origin: Secondary | ICD-10-CM | POA: Diagnosis not present

## 2022-01-16 DIAGNOSIS — R52 Pain, unspecified: Secondary | ICD-10-CM | POA: Diagnosis not present

## 2022-01-16 DIAGNOSIS — Z992 Dependence on renal dialysis: Secondary | ICD-10-CM | POA: Diagnosis not present

## 2022-01-18 DIAGNOSIS — N2581 Secondary hyperparathyroidism of renal origin: Secondary | ICD-10-CM | POA: Diagnosis not present

## 2022-01-18 DIAGNOSIS — D631 Anemia in chronic kidney disease: Secondary | ICD-10-CM | POA: Diagnosis not present

## 2022-01-18 DIAGNOSIS — D689 Coagulation defect, unspecified: Secondary | ICD-10-CM | POA: Diagnosis not present

## 2022-01-18 DIAGNOSIS — Z992 Dependence on renal dialysis: Secondary | ICD-10-CM | POA: Diagnosis not present

## 2022-01-18 DIAGNOSIS — R52 Pain, unspecified: Secondary | ICD-10-CM | POA: Diagnosis not present

## 2022-01-18 DIAGNOSIS — N186 End stage renal disease: Secondary | ICD-10-CM | POA: Diagnosis not present

## 2022-01-23 DIAGNOSIS — N2581 Secondary hyperparathyroidism of renal origin: Secondary | ICD-10-CM | POA: Diagnosis not present

## 2022-01-23 DIAGNOSIS — D631 Anemia in chronic kidney disease: Secondary | ICD-10-CM | POA: Diagnosis not present

## 2022-01-23 DIAGNOSIS — D689 Coagulation defect, unspecified: Secondary | ICD-10-CM | POA: Diagnosis not present

## 2022-01-23 DIAGNOSIS — N186 End stage renal disease: Secondary | ICD-10-CM | POA: Diagnosis not present

## 2022-01-23 DIAGNOSIS — R52 Pain, unspecified: Secondary | ICD-10-CM | POA: Diagnosis not present

## 2022-01-23 DIAGNOSIS — Z992 Dependence on renal dialysis: Secondary | ICD-10-CM | POA: Diagnosis not present

## 2022-01-25 DIAGNOSIS — N186 End stage renal disease: Secondary | ICD-10-CM | POA: Diagnosis not present

## 2022-01-25 DIAGNOSIS — Z992 Dependence on renal dialysis: Secondary | ICD-10-CM | POA: Diagnosis not present

## 2022-01-25 DIAGNOSIS — N2581 Secondary hyperparathyroidism of renal origin: Secondary | ICD-10-CM | POA: Diagnosis not present

## 2022-01-25 DIAGNOSIS — D631 Anemia in chronic kidney disease: Secondary | ICD-10-CM | POA: Diagnosis not present

## 2022-01-25 DIAGNOSIS — R52 Pain, unspecified: Secondary | ICD-10-CM | POA: Diagnosis not present

## 2022-01-25 DIAGNOSIS — D689 Coagulation defect, unspecified: Secondary | ICD-10-CM | POA: Diagnosis not present

## 2022-01-27 DIAGNOSIS — D631 Anemia in chronic kidney disease: Secondary | ICD-10-CM | POA: Diagnosis not present

## 2022-01-27 DIAGNOSIS — N186 End stage renal disease: Secondary | ICD-10-CM | POA: Diagnosis not present

## 2022-01-27 DIAGNOSIS — R52 Pain, unspecified: Secondary | ICD-10-CM | POA: Diagnosis not present

## 2022-01-27 DIAGNOSIS — D689 Coagulation defect, unspecified: Secondary | ICD-10-CM | POA: Diagnosis not present

## 2022-01-27 DIAGNOSIS — Z992 Dependence on renal dialysis: Secondary | ICD-10-CM | POA: Diagnosis not present

## 2022-01-27 DIAGNOSIS — N2581 Secondary hyperparathyroidism of renal origin: Secondary | ICD-10-CM | POA: Diagnosis not present

## 2022-01-28 ENCOUNTER — Other Ambulatory Visit: Payer: Self-pay | Admitting: Internal Medicine

## 2022-01-29 ENCOUNTER — Other Ambulatory Visit: Payer: Self-pay | Admitting: Physician Assistant

## 2022-01-30 DIAGNOSIS — R52 Pain, unspecified: Secondary | ICD-10-CM | POA: Diagnosis not present

## 2022-01-30 DIAGNOSIS — N2581 Secondary hyperparathyroidism of renal origin: Secondary | ICD-10-CM | POA: Diagnosis not present

## 2022-01-30 DIAGNOSIS — Z992 Dependence on renal dialysis: Secondary | ICD-10-CM | POA: Diagnosis not present

## 2022-01-30 DIAGNOSIS — D631 Anemia in chronic kidney disease: Secondary | ICD-10-CM | POA: Diagnosis not present

## 2022-01-30 DIAGNOSIS — N186 End stage renal disease: Secondary | ICD-10-CM | POA: Diagnosis not present

## 2022-01-30 DIAGNOSIS — D689 Coagulation defect, unspecified: Secondary | ICD-10-CM | POA: Diagnosis not present

## 2022-02-01 DIAGNOSIS — D689 Coagulation defect, unspecified: Secondary | ICD-10-CM | POA: Diagnosis not present

## 2022-02-01 DIAGNOSIS — Z992 Dependence on renal dialysis: Secondary | ICD-10-CM | POA: Diagnosis not present

## 2022-02-01 DIAGNOSIS — N2581 Secondary hyperparathyroidism of renal origin: Secondary | ICD-10-CM | POA: Diagnosis not present

## 2022-02-01 DIAGNOSIS — D509 Iron deficiency anemia, unspecified: Secondary | ICD-10-CM | POA: Diagnosis not present

## 2022-02-01 DIAGNOSIS — D631 Anemia in chronic kidney disease: Secondary | ICD-10-CM | POA: Diagnosis not present

## 2022-02-01 DIAGNOSIS — N186 End stage renal disease: Secondary | ICD-10-CM | POA: Diagnosis not present

## 2022-02-03 DIAGNOSIS — D631 Anemia in chronic kidney disease: Secondary | ICD-10-CM | POA: Diagnosis not present

## 2022-02-03 DIAGNOSIS — N2581 Secondary hyperparathyroidism of renal origin: Secondary | ICD-10-CM | POA: Diagnosis not present

## 2022-02-03 DIAGNOSIS — N186 End stage renal disease: Secondary | ICD-10-CM | POA: Diagnosis not present

## 2022-02-03 DIAGNOSIS — Z992 Dependence on renal dialysis: Secondary | ICD-10-CM | POA: Diagnosis not present

## 2022-02-03 DIAGNOSIS — D509 Iron deficiency anemia, unspecified: Secondary | ICD-10-CM | POA: Diagnosis not present

## 2022-02-03 DIAGNOSIS — D689 Coagulation defect, unspecified: Secondary | ICD-10-CM | POA: Diagnosis not present

## 2022-02-06 DIAGNOSIS — D689 Coagulation defect, unspecified: Secondary | ICD-10-CM | POA: Diagnosis not present

## 2022-02-06 DIAGNOSIS — D631 Anemia in chronic kidney disease: Secondary | ICD-10-CM | POA: Diagnosis not present

## 2022-02-06 DIAGNOSIS — D509 Iron deficiency anemia, unspecified: Secondary | ICD-10-CM | POA: Diagnosis not present

## 2022-02-06 DIAGNOSIS — Z992 Dependence on renal dialysis: Secondary | ICD-10-CM | POA: Diagnosis not present

## 2022-02-06 DIAGNOSIS — N186 End stage renal disease: Secondary | ICD-10-CM | POA: Diagnosis not present

## 2022-02-06 DIAGNOSIS — N2581 Secondary hyperparathyroidism of renal origin: Secondary | ICD-10-CM | POA: Diagnosis not present

## 2022-02-08 DIAGNOSIS — D689 Coagulation defect, unspecified: Secondary | ICD-10-CM | POA: Diagnosis not present

## 2022-02-08 DIAGNOSIS — D631 Anemia in chronic kidney disease: Secondary | ICD-10-CM | POA: Diagnosis not present

## 2022-02-08 DIAGNOSIS — N2581 Secondary hyperparathyroidism of renal origin: Secondary | ICD-10-CM | POA: Diagnosis not present

## 2022-02-08 DIAGNOSIS — D509 Iron deficiency anemia, unspecified: Secondary | ICD-10-CM | POA: Diagnosis not present

## 2022-02-08 DIAGNOSIS — Z992 Dependence on renal dialysis: Secondary | ICD-10-CM | POA: Diagnosis not present

## 2022-02-08 DIAGNOSIS — N186 End stage renal disease: Secondary | ICD-10-CM | POA: Diagnosis not present

## 2022-02-10 DIAGNOSIS — D689 Coagulation defect, unspecified: Secondary | ICD-10-CM | POA: Diagnosis not present

## 2022-02-10 DIAGNOSIS — D631 Anemia in chronic kidney disease: Secondary | ICD-10-CM | POA: Diagnosis not present

## 2022-02-10 DIAGNOSIS — N2581 Secondary hyperparathyroidism of renal origin: Secondary | ICD-10-CM | POA: Diagnosis not present

## 2022-02-10 DIAGNOSIS — Z992 Dependence on renal dialysis: Secondary | ICD-10-CM | POA: Diagnosis not present

## 2022-02-10 DIAGNOSIS — D509 Iron deficiency anemia, unspecified: Secondary | ICD-10-CM | POA: Diagnosis not present

## 2022-02-10 DIAGNOSIS — N186 End stage renal disease: Secondary | ICD-10-CM | POA: Diagnosis not present

## 2022-02-11 ENCOUNTER — Other Ambulatory Visit: Payer: Self-pay | Admitting: Physician Assistant

## 2022-02-11 DIAGNOSIS — I10 Essential (primary) hypertension: Secondary | ICD-10-CM

## 2022-02-13 DIAGNOSIS — Z992 Dependence on renal dialysis: Secondary | ICD-10-CM | POA: Diagnosis not present

## 2022-02-13 DIAGNOSIS — N186 End stage renal disease: Secondary | ICD-10-CM | POA: Diagnosis not present

## 2022-02-13 DIAGNOSIS — D689 Coagulation defect, unspecified: Secondary | ICD-10-CM | POA: Diagnosis not present

## 2022-02-13 DIAGNOSIS — D631 Anemia in chronic kidney disease: Secondary | ICD-10-CM | POA: Diagnosis not present

## 2022-02-13 DIAGNOSIS — D509 Iron deficiency anemia, unspecified: Secondary | ICD-10-CM | POA: Diagnosis not present

## 2022-02-13 DIAGNOSIS — N2581 Secondary hyperparathyroidism of renal origin: Secondary | ICD-10-CM | POA: Diagnosis not present

## 2022-02-14 ENCOUNTER — Encounter
Admission: RE | Disposition: A | Discharge status: Home / Self Care | Payer: Self-pay | Source: Home / Self Care | Attending: Gastroenterology

## 2022-02-14 ENCOUNTER — Ambulatory Visit
Admission: RE | Admit: 2022-02-14 | Discharge: 2022-02-14 | Disposition: A | Discharge status: Home / Self Care | Payer: Medicare Other | Attending: Gastroenterology | Admitting: Gastroenterology

## 2022-02-14 ENCOUNTER — Other Ambulatory Visit: Payer: Self-pay

## 2022-02-14 ENCOUNTER — Ambulatory Visit: Payer: Medicare Other | Admitting: Anesthesiology

## 2022-02-14 DIAGNOSIS — D120 Benign neoplasm of cecum: Secondary | ICD-10-CM | POA: Insufficient documentation

## 2022-02-14 DIAGNOSIS — Z86010 Personal history of colonic polyps: Secondary | ICD-10-CM | POA: Diagnosis not present

## 2022-02-14 DIAGNOSIS — M109 Gout, unspecified: Secondary | ICD-10-CM | POA: Diagnosis not present

## 2022-02-14 DIAGNOSIS — I6522 Occlusion and stenosis of left carotid artery: Secondary | ICD-10-CM | POA: Diagnosis not present

## 2022-02-14 DIAGNOSIS — N186 End stage renal disease: Secondary | ICD-10-CM | POA: Diagnosis not present

## 2022-02-14 DIAGNOSIS — Z8673 Personal history of transient ischemic attack (TIA), and cerebral infarction without residual deficits: Secondary | ICD-10-CM | POA: Insufficient documentation

## 2022-02-14 DIAGNOSIS — I120 Hypertensive chronic kidney disease with stage 5 chronic kidney disease or end stage renal disease: Secondary | ICD-10-CM | POA: Diagnosis not present

## 2022-02-14 DIAGNOSIS — Z1211 Encounter for screening for malignant neoplasm of colon: Secondary | ICD-10-CM | POA: Diagnosis not present

## 2022-02-14 DIAGNOSIS — K5730 Diverticulosis of large intestine without perforation or abscess without bleeding: Secondary | ICD-10-CM | POA: Diagnosis not present

## 2022-02-14 DIAGNOSIS — Z992 Dependence on renal dialysis: Secondary | ICD-10-CM | POA: Diagnosis not present

## 2022-02-14 DIAGNOSIS — K635 Polyp of colon: Secondary | ICD-10-CM | POA: Diagnosis not present

## 2022-02-14 DIAGNOSIS — D122 Benign neoplasm of ascending colon: Secondary | ICD-10-CM | POA: Insufficient documentation

## 2022-02-14 DIAGNOSIS — Z87891 Personal history of nicotine dependence: Secondary | ICD-10-CM | POA: Insufficient documentation

## 2022-02-14 DIAGNOSIS — D127 Benign neoplasm of rectosigmoid junction: Secondary | ICD-10-CM | POA: Diagnosis not present

## 2022-02-14 SURGERY — COLONOSCOPY WITH PROPOFOL
Anesthesia: General

## 2022-02-14 MED ORDER — SODIUM CHLORIDE 0.9 % IV SOLN: INTRAVENOUS | Status: DC

## 2022-02-14 MED ORDER — PROPOFOL 10 MG/ML IV BOLUS
INTRAVENOUS | Status: AC
  Filled 2022-02-14: qty 40

## 2022-02-14 MED ORDER — PROPOFOL 500 MG/50ML IV EMUL
INTRAVENOUS | Status: DC | PRN
Start: 2022-02-14 — End: 2022-02-14
  Administered 2022-02-14: 150 ug/kg/min via INTRAVENOUS

## 2022-02-14 MED ORDER — PROPOFOL 10 MG/ML IV BOLUS
INTRAVENOUS | Status: DC | PRN
  Administered 2022-02-14: 70 mg via INTRAVENOUS

## 2022-02-14 NOTE — Transfer of Care (Signed)
Immediate Anesthesia Transfer of Care Note ? ?Patient: Kristen Francis ? ?Procedure(s) Performed: COLONOSCOPY WITH PROPOFOL ? ?Patient Location: Endoscopy Unit ? ?Anesthesia Type:General ? ?Level of Consciousness: awake ? ?Airway & Oxygen Therapy: Patient Spontanous Breathing ? ?Post-op Assessment: Report given to RN and Post -op Vital signs reviewed and stable ? ?Post vital signs: Reviewed and stable ? ?Last Vitals:  ?Vitals Value Taken Time  ?BP 143/71 02/14/22 1253  ?Temp    ?Pulse 69 02/14/22 1253  ?Resp 15 02/14/22 1253  ?SpO2 96 % 02/14/22 1253  ? ? ?Last Pain:  ?Vitals:  ? 02/14/22 1148  ?TempSrc: Temporal  ?PainSc: 0-No pain  ?   ? ?  ? ?Complications: No notable events documented. ?

## 2022-02-14 NOTE — H&P (Signed)
Outpatient short stay form Pre-procedure ?02/14/2022  ?Lesly Rubenstein, MD ? ?Primary Physician: Lavera Guise, MD ? ?Reason for visit:  Surveillance colonoscopy ? ?History of present illness:   ? ?76 y/o lady with history of ESRD and hypertension here for surveillance colonoscopy for adenomatous polyp found on last colonoscopy done a little over 5 years ago. No blood thinners. No significant abdominal surgeries. No family history of GI malignancies. ? ? ? ?Current Facility-Administered Medications:  ?  0.9 %  sodium chloride infusion, , Intravenous, Continuous, Darah Simkin, Hilton Cork, MD, Last Rate: 20 mL/hr at 02/14/22 1218, Continued from Pre-op at 02/14/22 1218 ? ?Medications Prior to Admission  ?Medication Sig Dispense Refill Last Dose  ? allopurinol (ZYLOPRIM) 100 MG tablet Take 1 tablet (100 mg total) by mouth daily. 30 tablet 1 02/13/2022  ? amLODipine (NORVASC) 10 MG tablet TAKE 1 TABLET(10 MG) BY MOUTH DAILY 30 tablet 0 02/13/2022  ? aspirin EC 81 MG EC tablet Take 1 tablet (81 mg total) by mouth daily. Swallow whole. 30 tablet 0 02/13/2022  ? B Complex-C-Folic Acid (RENAL-VITE) 0.8 MG TABS Take 0.8 mg by mouth every evening. On dialysis days   Past Week  ? carvedilol (COREG) 25 MG tablet Take 25 mg by mouth 2 (two) times daily with a meal.   02/14/2022  ? cholecalciferol (VITAMIN D) 1000 units tablet Take 1,000 Units by mouth daily.    Past Week  ? cinacalcet (SENSIPAR) 60 MG tablet Take 60 mg by mouth every evening.   02/13/2022  ? ferrous sulfate 325 (65 FE) MG tablet Take 1 tablet (325 mg total) by mouth 2 (two) times daily with a meal. 60 tablet 2 Past Week  ? hydrALAZINE (APRESOLINE) 10 MG tablet TAKE 1 TABLET(10 MG) BY MOUTH TWICE DAILY 60 tablet 2 02/13/2022  ? hydrochlorothiazide (HYDRODIURIL) 25 MG tablet Take 12.5 mg by mouth daily.   02/13/2022  ? losartan (COZAAR) 50 MG tablet Take 50 mg by mouth daily.   02/14/2022  ? omeprazole (PRILOSEC) 40 MG capsule TAKE 1 CAPSULE BY MOUTH DAILY 90 capsule 1  02/13/2022  ? rosuvastatin (CRESTOR) 5 MG tablet TAKE 1 TABLET BY MOUTH DAILY. TAKE ON DAYS NOT HAVING DIALYSIS. 90 tablet 1 02/13/2022  ? sennosides-docusate sodium (SENOKOT-S) 8.6-50 MG tablet Take 2 tablets by mouth daily.     ? lidocaine-prilocaine (EMLA) cream APPLY SMALL AMOUNT TO ACCESS SITE (AVF) 3 TIMES A WEEK 1 HOUR BEFORE DIALYSIS. COVER WITH OCCLUSIVE DRESSING (SARAN WRAP)  6   ? triamcinolone cream (KENALOG) 0.1 % Apply 1 application topically 2 (two) times daily. 30 g 0   ? ? ? ?Allergies  ?Allergen Reactions  ? Other Other (See Comments)  ?  Pt does not receive Blood   ? Penicillins Hives  ?  Has patient had a PCN reaction causing immediate rash, facial/tongue/throat swelling, SOB or lightheadedness with hypotension: YES ?Has patient had a PCN reaction causing severe rash involving mucus membranes or skin necrosis: no ?Has patient had a PCN reaction that required hospitalization: no ?Has patient had a PCN reaction occurring within the last 10 years: no ?If all of the above answers are "NO", then may proceed with Cephalosporin use. ?  ? Sodium Bicarbonate Itching  ? ? ? ?Past Medical History:  ?Diagnosis Date  ? Anemia   ? Arthritis   ? Chronic kidney disease   ? ESRD  ? Complication of anesthesia   ? had procedure and felt incision early 80s  ? Gout   ?  Hyperlipemia   ? Hypertension   ? Macular degeneration, right eye   ? Stroke [facility])   ? ? ?Review of systems:  Otherwise negative.  ? ? ?Physical Exam ? ?Gen: Alert, oriented. Appears stated age.  ?HEENT: PERRLA. ?Lungs: No respiratory distress ?CV: RRR ?Abd: soft, benign, no masses ?Ext: No edema ? ? ? ?Planned procedures: Proceed with colonoscopy. The patient understands the nature of the planned procedure, indications, risks, alternatives and potential complications including but not limited to bleeding, infection, perforation, damage to internal organs and possible oversedation/side effects from anesthesia. The patient agrees and gives consent to  proceed.  ?Please refer to procedure notes for findings, recommendations and patient disposition/instructions.  ? ? ? ?Lesly Rubenstein, MD ?[facility] ? ? ? ?  ? ?

## 2022-02-14 NOTE — Op Note (Signed)
[facility] ?Gastroenterology ?Patient Name: Kristen Francis ?Procedure Date: 02/14/2022 12:18 PM ?MRN: [id number] ?Account #: [id number] ?Date of Birth: 1946/07/13 ?Admit Type: Outpatient ?Age: 76 ?Room: [facility] ENDO ROOM 1 ?Gender: Female ?Note Status: Finalized ?Instrument Name: Peds Colonoscope [id number] ?Procedure:             Colonoscopy ?Indications:           Surveillance: Personal history of adenomatous polyps  ?                       on last colonoscopy 5 years ago ?Providers:             Andrey Farmer MD, MD ?Referring MD:          Lavera Guise, MD (Referring MD) ?Medicines:             Monitored Anesthesia Care ?Complications:         No immediate complications. Estimated blood loss:  ?                       Minimal. ?Procedure:             Pre-Anesthesia Assessment: ?                       - Prior to the procedure, a History and Physical was  ?                       performed, and patient medications and allergies were  ?                       reviewed. The patient is competent. The risks and  ?                       benefits of the procedure and the sedation options and  ?                       risks were discussed with the patient. All questions  ?                       were answered and informed consent was obtained.  ?                       Patient identification and proposed procedure were  ?                       verified by the physician, the nurse, the  ?                       anesthesiologist, the anesthetist and the technician  ?                       in the endoscopy suite. Mental Status Examination:  ?                       alert and oriented. Airway Examination: normal  ?                       oropharyngeal airway and neck mobility. Respiratory  ?  Examination: clear to auscultation. CV Examination:  ?                       normal. Prophylactic Antibiotics: The patient does not  ?                       require prophylactic antibiotics. Prior  ?                        Anticoagulants: The patient has taken no previous  ?                       anticoagulant or antiplatelet agents. ASA Grade  ?                       Assessment: III - A patient with severe systemic  ?                       disease. After reviewing the risks and benefits, the  ?                       patient was deemed in satisfactory condition to  ?                       undergo the procedure. The anesthesia plan was to use  ?                       monitored anesthesia care (MAC). Immediately prior to  ?                       administration of medications, the patient was  ?                       re-assessed for adequacy to receive sedatives. The  ?                       heart rate, respiratory rate, oxygen saturations,  ?                       blood pressure, adequacy of pulmonary ventilation, and  ?                       response to care were monitored throughout the  ?                       procedure. The physical status of the patient was  ?                       re-assessed after the procedure. ?                       After obtaining informed consent, the colonoscope was  ?                       passed under direct vision. Throughout the procedure,  ?                       the patient's blood pressure, pulse, and oxygen  ?  saturations were monitored continuously. The  ?                       Colonoscope was introduced through the anus and  ?                       advanced to the the cecum, identified by appendiceal  ?                       orifice and ileocecal valve. The colonoscopy was  ?                       performed without difficulty. The patient tolerated  ?                       the procedure well. The quality of the bowel  ?                       preparation was fair. ?Findings: ?     The perianal and digital rectal examinations were normal. ?     A 4 mm polyp was found in the cecum. The polyp was sessile. The polyp  ?     was removed with a cold snare. Resection and  retrieval were complete.  ?     Estimated blood loss was minimal. ?     A 2 mm polyp was found in the ascending colon. The polyp was sessile.  ?     The polyp was removed with a jumbo cold forceps. Resection and retrieval  ?     were complete. Estimated blood loss was minimal. ?     A 1 mm polyp was found in the recto-sigmoid colon. The polyp was  ?     sessile. The polyp was removed with a jumbo cold forceps. Resection and  ?     retrieval were complete. Estimated blood loss was minimal. ?     Multiple small-mouthed diverticula were found in the sigmoid colon. ?     The exam was otherwise without abnormality on direct and retroflexion  ?     views. ?Impression:            - Preparation of the colon was fair. ?                       - One 4 mm polyp in the cecum, removed with a cold  ?                       snare. Resected and retrieved. ?                       - One 2 mm polyp in the ascending colon, removed with  ?                       a jumbo cold forceps. Resected and retrieved. ?                       - One 1 mm polyp at the recto-sigmoid colon, removed  ?                       with a jumbo cold forceps. Resected and retrieved. ?                       -  Diverticulosis in the sigmoid colon. ?                       - The examination was otherwise normal on direct and  ?                       retroflexion views. ?Recommendation:        - Discharge patient to home. ?                       - Resume previous diet. ?                       - Continue present medications. ?                       - Await pathology results. ?                       - Repeat colonoscopy date to be determined after  ?                       pending pathology results are reviewed for  ?                       surveillance. ?                       - Return to referring physician as previously  ?                       scheduled. ?Procedure Code(s):     --- Professional --- ?                       [phone], Colonoscopy, flexible; with removal of  ?                        tumor(s), polyp(s), or other lesion(s) by snare  ?                       technique ?                       [id number], 59, Colonoscopy, flexible; with biopsy, single  ?                       or multiple ?Diagnosis Code(s):     --- Professional --- ?                       Z86.010, Personal history of colonic polyps ?                       K63.5, Polyp of colon ?                       K57.30, Diverticulosis of large intestine without  ?                       perforation or abscess without bleeding ?CPT copyright 2019 [employer] Medical Association. All rights reserved. ?The codes documented in this report are preliminary and upon coder review may  ?be revised to meet current  compliance requirements. ?Andrey Farmer MD, MD ?02/14/2022 12:49:29 PM ?Number of Addenda: 0 ?Note Initiated On: 02/14/2022 12:18 PM ?Scope Withdrawal Time: 0 hours 9 minutes 43 seconds  ?Total Procedure Duration: 0 hours 15 minutes 20 seconds  ?Estimated Blood Loss:  Estimated blood loss was minimal. ?     [facility] ?

## 2022-02-14 NOTE — Anesthesia Preprocedure Evaluation (Addendum)
Anesthesia Evaluation  ?Patient identified by MRN, date of birth, ID band ?Patient awake ? ? ? ?Reviewed: ?Allergy & Precautions, NPO status , Patient's Chart, lab work & pertinent test results ? ?History of Anesthesia Complications ?(+) AWARENESS UNDER ANESTHESIA and history of anesthetic complications ? ?Airway ?Mallampati: IV ? ? ?Neck ROM: Full ? ? ? Dental ? ?(+) Partial Upper ?  ?Pulmonary ?former smoker (quit 1960s),  ?  ?Pulmonary exam normal ?breath sounds clear to auscultation ? ? ? ? ? ? Cardiovascular ?hypertension, Normal cardiovascular exam ?Rhythm:Regular Rate:Normal ? ?ECG 06/03/21:  ?Normal sinus rhythm ?Nonspecific T wave abnormality ?  ?Neuro/Psych ?CVA (in early 2000s, no residual symptoms)   ? GI/Hepatic ?negative GI ROS,   ?Endo/Other  ?negative endocrine ROS ? Renal/GU ?ESRF and DialysisRenal disease (last HD 02/13/22)  ? ?  ?Musculoskeletal ? ?(+) Arthritis , Gout   ? Abdominal ?  ?Peds ? Hematology ? ?(+) Blood dyscrasia, anemia , REFUSES BLOOD PRODUCTS, JEHOVAH'S WITNESS  ?Anesthesia Other Findings ? ? Reproductive/Obstetrics ? ?  ? ? ? ? ? ? ? ? ? ? ? ? ? ?  ?  ? ? ? ? ? ? ? ?Anesthesia Physical ?Anesthesia Plan ? ?ASA: 4 ? ?Anesthesia Plan: General  ? ?Post-op Pain Management:   ? ?Induction: Intravenous ? ?PONV Risk Score and Plan: 3 and Propofol infusion, TIVA and Treatment may vary due to age or medical condition ? ?Airway Management Planned: Natural Airway ? ?Additional Equipment:  ? ?Intra-op Plan:  ? ?Post-operative Plan:  ? ?Informed Consent: I have reviewed the patients History and Physical, chart, labs and discussed the procedure including the risks, benefits and alternatives for the proposed anesthesia with the patient or authorized representative who has indicated his/her understanding and acceptance.  ? ? ? ? ? ?Plan Discussed with: CRNA ? ?Anesthesia Plan Comments: (LMA/GETA backup discussed.  Patient consented for risks of anesthesia including  but not limited to:  ?- adverse reactions to medications ?- damage to eyes, teeth, lips or other oral mucosa ?- nerve damage due to positioning  ?- sore throat or hoarseness ?- damage to heart, brain, nerves, lungs, other parts of body or loss of life ? ?Informed patient about role of CRNA in peri- and intra-operative care.  Patient voiced understanding.)  ? ? ? ? ? ? ?Anesthesia Quick Evaluation ? ?

## 2022-02-14 NOTE — Interval H&P Note (Signed)
History and Physical Interval Note: ? ?02/14/2022 ?12:21 PM ? ?Kristen Francis  has presented today for surgery, with the diagnosis of Z86.010 - Hx of adenomatous polyp of colon.  The various methods of treatment have been discussed with the patient and family. After consideration of risks, benefits and other options for treatment, the patient has consented to  Procedure(s): ?COLONOSCOPY WITH PROPOFOL (N/A) as a surgical intervention.  The patient's history has been reviewed, patient examined, no change in status, stable for surgery.  I have reviewed the patient's chart and labs.  Questions were answered to the patient's satisfaction.   ? ? ?Hilton Cork Andros Channing ? ?Ok to proceed with colonoscopy ?

## 2022-02-15 ENCOUNTER — Encounter: Payer: Self-pay | Admitting: Gastroenterology

## 2022-02-15 DIAGNOSIS — N2581 Secondary hyperparathyroidism of renal origin: Secondary | ICD-10-CM | POA: Diagnosis not present

## 2022-02-15 DIAGNOSIS — D631 Anemia in chronic kidney disease: Secondary | ICD-10-CM | POA: Diagnosis not present

## 2022-02-15 DIAGNOSIS — D509 Iron deficiency anemia, unspecified: Secondary | ICD-10-CM | POA: Diagnosis not present

## 2022-02-15 DIAGNOSIS — Z992 Dependence on renal dialysis: Secondary | ICD-10-CM | POA: Diagnosis not present

## 2022-02-15 DIAGNOSIS — N186 End stage renal disease: Secondary | ICD-10-CM | POA: Diagnosis not present

## 2022-02-15 DIAGNOSIS — D689 Coagulation defect, unspecified: Secondary | ICD-10-CM | POA: Diagnosis not present

## 2022-02-15 LAB — SURGICAL PATHOLOGY

## 2022-02-15 NOTE — Anesthesia Postprocedure Evaluation (Signed)
Anesthesia Post Note ? ?Patient: Kristen Francis ? ?Procedure(s) Performed: COLONOSCOPY WITH PROPOFOL ? ?Patient location during evaluation: PACU ?Anesthesia Type: General ?Level of consciousness: awake and alert, oriented and patient cooperative ?Pain management: pain level controlled ?Vital Signs Assessment: post-procedure vital signs reviewed and stable ?Respiratory status: spontaneous breathing, nonlabored ventilation and respiratory function stable ?Cardiovascular status: blood pressure returned to baseline and stable ?Postop Assessment: adequate PO intake ?Anesthetic complications: no ? ? ?No notable events documented. ? ? ?Last Vitals:  ?Vitals:  ? 02/14/22 1302 02/14/22 1311  ?BP: (!) 160/85 (!) 179/92  ?Pulse:    ?Resp:    ?Temp:    ?SpO2:    ?  ?Last Pain:  ?Vitals:  ? 02/15/22 0730  ?TempSrc:   ?PainSc: 0-No pain  ? ? ?  ?  ?  ?  ?  ?  ? ?Darrin Nipper ? ? ? ? ?

## 2022-02-17 DIAGNOSIS — D631 Anemia in chronic kidney disease: Secondary | ICD-10-CM | POA: Diagnosis not present

## 2022-02-17 DIAGNOSIS — D689 Coagulation defect, unspecified: Secondary | ICD-10-CM | POA: Diagnosis not present

## 2022-02-17 DIAGNOSIS — N186 End stage renal disease: Secondary | ICD-10-CM | POA: Diagnosis not present

## 2022-02-17 DIAGNOSIS — Z992 Dependence on renal dialysis: Secondary | ICD-10-CM | POA: Diagnosis not present

## 2022-02-17 DIAGNOSIS — N2581 Secondary hyperparathyroidism of renal origin: Secondary | ICD-10-CM | POA: Diagnosis not present

## 2022-02-17 DIAGNOSIS — D509 Iron deficiency anemia, unspecified: Secondary | ICD-10-CM | POA: Diagnosis not present

## 2022-02-20 DIAGNOSIS — Z992 Dependence on renal dialysis: Secondary | ICD-10-CM | POA: Diagnosis not present

## 2022-02-20 DIAGNOSIS — D631 Anemia in chronic kidney disease: Secondary | ICD-10-CM | POA: Diagnosis not present

## 2022-02-20 DIAGNOSIS — N2581 Secondary hyperparathyroidism of renal origin: Secondary | ICD-10-CM | POA: Diagnosis not present

## 2022-02-20 DIAGNOSIS — D509 Iron deficiency anemia, unspecified: Secondary | ICD-10-CM | POA: Diagnosis not present

## 2022-02-20 DIAGNOSIS — N186 End stage renal disease: Secondary | ICD-10-CM | POA: Diagnosis not present

## 2022-02-20 DIAGNOSIS — D689 Coagulation defect, unspecified: Secondary | ICD-10-CM | POA: Diagnosis not present

## 2022-02-22 DIAGNOSIS — N2581 Secondary hyperparathyroidism of renal origin: Secondary | ICD-10-CM | POA: Diagnosis not present

## 2022-02-22 DIAGNOSIS — D689 Coagulation defect, unspecified: Secondary | ICD-10-CM | POA: Diagnosis not present

## 2022-02-22 DIAGNOSIS — D631 Anemia in chronic kidney disease: Secondary | ICD-10-CM | POA: Diagnosis not present

## 2022-02-22 DIAGNOSIS — Z992 Dependence on renal dialysis: Secondary | ICD-10-CM | POA: Diagnosis not present

## 2022-02-22 DIAGNOSIS — D509 Iron deficiency anemia, unspecified: Secondary | ICD-10-CM | POA: Diagnosis not present

## 2022-02-22 DIAGNOSIS — N186 End stage renal disease: Secondary | ICD-10-CM | POA: Diagnosis not present

## 2022-02-24 DIAGNOSIS — D631 Anemia in chronic kidney disease: Secondary | ICD-10-CM | POA: Diagnosis not present

## 2022-02-24 DIAGNOSIS — D509 Iron deficiency anemia, unspecified: Secondary | ICD-10-CM | POA: Diagnosis not present

## 2022-02-24 DIAGNOSIS — N186 End stage renal disease: Secondary | ICD-10-CM | POA: Diagnosis not present

## 2022-02-24 DIAGNOSIS — D689 Coagulation defect, unspecified: Secondary | ICD-10-CM | POA: Diagnosis not present

## 2022-02-24 DIAGNOSIS — N2581 Secondary hyperparathyroidism of renal origin: Secondary | ICD-10-CM | POA: Diagnosis not present

## 2022-02-24 DIAGNOSIS — Z992 Dependence on renal dialysis: Secondary | ICD-10-CM | POA: Diagnosis not present

## 2022-02-27 DIAGNOSIS — D631 Anemia in chronic kidney disease: Secondary | ICD-10-CM | POA: Diagnosis not present

## 2022-02-27 DIAGNOSIS — N2581 Secondary hyperparathyroidism of renal origin: Secondary | ICD-10-CM | POA: Diagnosis not present

## 2022-02-27 DIAGNOSIS — D509 Iron deficiency anemia, unspecified: Secondary | ICD-10-CM | POA: Diagnosis not present

## 2022-02-27 DIAGNOSIS — Z992 Dependence on renal dialysis: Secondary | ICD-10-CM | POA: Diagnosis not present

## 2022-02-27 DIAGNOSIS — N186 End stage renal disease: Secondary | ICD-10-CM | POA: Diagnosis not present

## 2022-02-27 DIAGNOSIS — D689 Coagulation defect, unspecified: Secondary | ICD-10-CM | POA: Diagnosis not present

## 2022-03-01 ENCOUNTER — Telehealth: Payer: Self-pay

## 2022-03-01 ENCOUNTER — Other Ambulatory Visit: Payer: Self-pay | Admitting: Physician Assistant

## 2022-03-01 DIAGNOSIS — N2581 Secondary hyperparathyroidism of renal origin: Secondary | ICD-10-CM | POA: Diagnosis not present

## 2022-03-01 DIAGNOSIS — Z992 Dependence on renal dialysis: Secondary | ICD-10-CM | POA: Diagnosis not present

## 2022-03-01 DIAGNOSIS — D631 Anemia in chronic kidney disease: Secondary | ICD-10-CM | POA: Diagnosis not present

## 2022-03-01 DIAGNOSIS — D509 Iron deficiency anemia, unspecified: Secondary | ICD-10-CM | POA: Diagnosis not present

## 2022-03-01 DIAGNOSIS — N186 End stage renal disease: Secondary | ICD-10-CM | POA: Diagnosis not present

## 2022-03-01 DIAGNOSIS — D689 Coagulation defect, unspecified: Secondary | ICD-10-CM | POA: Diagnosis not present

## 2022-03-01 NOTE — Telephone Encounter (Signed)
Refill sent to pharmacy, next appt is made. ?

## 2022-03-03 DIAGNOSIS — D631 Anemia in chronic kidney disease: Secondary | ICD-10-CM | POA: Diagnosis not present

## 2022-03-03 DIAGNOSIS — N186 End stage renal disease: Secondary | ICD-10-CM | POA: Diagnosis not present

## 2022-03-03 DIAGNOSIS — N2581 Secondary hyperparathyroidism of renal origin: Secondary | ICD-10-CM | POA: Diagnosis not present

## 2022-03-03 DIAGNOSIS — I120 Hypertensive chronic kidney disease with stage 5 chronic kidney disease or end stage renal disease: Secondary | ICD-10-CM | POA: Diagnosis not present

## 2022-03-03 DIAGNOSIS — D509 Iron deficiency anemia, unspecified: Secondary | ICD-10-CM | POA: Diagnosis not present

## 2022-03-03 DIAGNOSIS — D689 Coagulation defect, unspecified: Secondary | ICD-10-CM | POA: Diagnosis not present

## 2022-03-03 DIAGNOSIS — Z992 Dependence on renal dialysis: Secondary | ICD-10-CM | POA: Diagnosis not present

## 2022-03-06 DIAGNOSIS — N186 End stage renal disease: Secondary | ICD-10-CM | POA: Diagnosis not present

## 2022-03-06 DIAGNOSIS — D689 Coagulation defect, unspecified: Secondary | ICD-10-CM | POA: Diagnosis not present

## 2022-03-06 DIAGNOSIS — N2581 Secondary hyperparathyroidism of renal origin: Secondary | ICD-10-CM | POA: Diagnosis not present

## 2022-03-06 DIAGNOSIS — D509 Iron deficiency anemia, unspecified: Secondary | ICD-10-CM | POA: Diagnosis not present

## 2022-03-06 DIAGNOSIS — D631 Anemia in chronic kidney disease: Secondary | ICD-10-CM | POA: Diagnosis not present

## 2022-03-06 DIAGNOSIS — Z992 Dependence on renal dialysis: Secondary | ICD-10-CM | POA: Diagnosis not present

## 2022-03-08 DIAGNOSIS — N2581 Secondary hyperparathyroidism of renal origin: Secondary | ICD-10-CM | POA: Diagnosis not present

## 2022-03-08 DIAGNOSIS — N186 End stage renal disease: Secondary | ICD-10-CM | POA: Diagnosis not present

## 2022-03-08 DIAGNOSIS — D689 Coagulation defect, unspecified: Secondary | ICD-10-CM | POA: Diagnosis not present

## 2022-03-08 DIAGNOSIS — D631 Anemia in chronic kidney disease: Secondary | ICD-10-CM | POA: Diagnosis not present

## 2022-03-08 DIAGNOSIS — Z992 Dependence on renal dialysis: Secondary | ICD-10-CM | POA: Diagnosis not present

## 2022-03-08 DIAGNOSIS — D509 Iron deficiency anemia, unspecified: Secondary | ICD-10-CM | POA: Diagnosis not present

## 2022-03-10 DIAGNOSIS — N186 End stage renal disease: Secondary | ICD-10-CM | POA: Diagnosis not present

## 2022-03-10 DIAGNOSIS — N2581 Secondary hyperparathyroidism of renal origin: Secondary | ICD-10-CM | POA: Diagnosis not present

## 2022-03-10 DIAGNOSIS — D631 Anemia in chronic kidney disease: Secondary | ICD-10-CM | POA: Diagnosis not present

## 2022-03-10 DIAGNOSIS — D509 Iron deficiency anemia, unspecified: Secondary | ICD-10-CM | POA: Diagnosis not present

## 2022-03-10 DIAGNOSIS — Z992 Dependence on renal dialysis: Secondary | ICD-10-CM | POA: Diagnosis not present

## 2022-03-10 DIAGNOSIS — D689 Coagulation defect, unspecified: Secondary | ICD-10-CM | POA: Diagnosis not present

## 2022-03-13 DIAGNOSIS — D689 Coagulation defect, unspecified: Secondary | ICD-10-CM | POA: Diagnosis not present

## 2022-03-13 DIAGNOSIS — D509 Iron deficiency anemia, unspecified: Secondary | ICD-10-CM | POA: Diagnosis not present

## 2022-03-13 DIAGNOSIS — N186 End stage renal disease: Secondary | ICD-10-CM | POA: Diagnosis not present

## 2022-03-13 DIAGNOSIS — N2581 Secondary hyperparathyroidism of renal origin: Secondary | ICD-10-CM | POA: Diagnosis not present

## 2022-03-13 DIAGNOSIS — Z992 Dependence on renal dialysis: Secondary | ICD-10-CM | POA: Diagnosis not present

## 2022-03-13 DIAGNOSIS — D631 Anemia in chronic kidney disease: Secondary | ICD-10-CM | POA: Diagnosis not present

## 2022-03-15 ENCOUNTER — Encounter: Payer: Self-pay | Admitting: Internal Medicine

## 2022-03-15 DIAGNOSIS — Z992 Dependence on renal dialysis: Secondary | ICD-10-CM | POA: Diagnosis not present

## 2022-03-15 DIAGNOSIS — N2581 Secondary hyperparathyroidism of renal origin: Secondary | ICD-10-CM | POA: Diagnosis not present

## 2022-03-15 DIAGNOSIS — D509 Iron deficiency anemia, unspecified: Secondary | ICD-10-CM | POA: Diagnosis not present

## 2022-03-15 DIAGNOSIS — N186 End stage renal disease: Secondary | ICD-10-CM | POA: Diagnosis not present

## 2022-03-15 DIAGNOSIS — D689 Coagulation defect, unspecified: Secondary | ICD-10-CM | POA: Diagnosis not present

## 2022-03-15 DIAGNOSIS — D631 Anemia in chronic kidney disease: Secondary | ICD-10-CM | POA: Diagnosis not present

## 2022-03-17 DIAGNOSIS — Z992 Dependence on renal dialysis: Secondary | ICD-10-CM | POA: Diagnosis not present

## 2022-03-17 DIAGNOSIS — N186 End stage renal disease: Secondary | ICD-10-CM | POA: Diagnosis not present

## 2022-03-17 DIAGNOSIS — D689 Coagulation defect, unspecified: Secondary | ICD-10-CM | POA: Diagnosis not present

## 2022-03-17 DIAGNOSIS — D631 Anemia in chronic kidney disease: Secondary | ICD-10-CM | POA: Diagnosis not present

## 2022-03-17 DIAGNOSIS — N2581 Secondary hyperparathyroidism of renal origin: Secondary | ICD-10-CM | POA: Diagnosis not present

## 2022-03-17 DIAGNOSIS — D509 Iron deficiency anemia, unspecified: Secondary | ICD-10-CM | POA: Diagnosis not present

## 2022-03-20 DIAGNOSIS — D689 Coagulation defect, unspecified: Secondary | ICD-10-CM | POA: Diagnosis not present

## 2022-03-20 DIAGNOSIS — D631 Anemia in chronic kidney disease: Secondary | ICD-10-CM | POA: Diagnosis not present

## 2022-03-20 DIAGNOSIS — Z992 Dependence on renal dialysis: Secondary | ICD-10-CM | POA: Diagnosis not present

## 2022-03-20 DIAGNOSIS — N186 End stage renal disease: Secondary | ICD-10-CM | POA: Diagnosis not present

## 2022-03-20 DIAGNOSIS — N2581 Secondary hyperparathyroidism of renal origin: Secondary | ICD-10-CM | POA: Diagnosis not present

## 2022-03-20 DIAGNOSIS — D509 Iron deficiency anemia, unspecified: Secondary | ICD-10-CM | POA: Diagnosis not present

## 2022-03-22 DIAGNOSIS — D509 Iron deficiency anemia, unspecified: Secondary | ICD-10-CM | POA: Diagnosis not present

## 2022-03-22 DIAGNOSIS — N2581 Secondary hyperparathyroidism of renal origin: Secondary | ICD-10-CM | POA: Diagnosis not present

## 2022-03-22 DIAGNOSIS — Z992 Dependence on renal dialysis: Secondary | ICD-10-CM | POA: Diagnosis not present

## 2022-03-22 DIAGNOSIS — D631 Anemia in chronic kidney disease: Secondary | ICD-10-CM | POA: Diagnosis not present

## 2022-03-22 DIAGNOSIS — N186 End stage renal disease: Secondary | ICD-10-CM | POA: Diagnosis not present

## 2022-03-22 DIAGNOSIS — D689 Coagulation defect, unspecified: Secondary | ICD-10-CM | POA: Diagnosis not present

## 2022-03-24 DIAGNOSIS — D509 Iron deficiency anemia, unspecified: Secondary | ICD-10-CM | POA: Diagnosis not present

## 2022-03-24 DIAGNOSIS — Z992 Dependence on renal dialysis: Secondary | ICD-10-CM | POA: Diagnosis not present

## 2022-03-24 DIAGNOSIS — D631 Anemia in chronic kidney disease: Secondary | ICD-10-CM | POA: Diagnosis not present

## 2022-03-24 DIAGNOSIS — N186 End stage renal disease: Secondary | ICD-10-CM | POA: Diagnosis not present

## 2022-03-24 DIAGNOSIS — N2581 Secondary hyperparathyroidism of renal origin: Secondary | ICD-10-CM | POA: Diagnosis not present

## 2022-03-24 DIAGNOSIS — D689 Coagulation defect, unspecified: Secondary | ICD-10-CM | POA: Diagnosis not present

## 2022-03-27 DIAGNOSIS — D631 Anemia in chronic kidney disease: Secondary | ICD-10-CM | POA: Diagnosis not present

## 2022-03-27 DIAGNOSIS — Z992 Dependence on renal dialysis: Secondary | ICD-10-CM | POA: Diagnosis not present

## 2022-03-27 DIAGNOSIS — D509 Iron deficiency anemia, unspecified: Secondary | ICD-10-CM | POA: Diagnosis not present

## 2022-03-27 DIAGNOSIS — N2581 Secondary hyperparathyroidism of renal origin: Secondary | ICD-10-CM | POA: Diagnosis not present

## 2022-03-27 DIAGNOSIS — N186 End stage renal disease: Secondary | ICD-10-CM | POA: Diagnosis not present

## 2022-03-27 DIAGNOSIS — D689 Coagulation defect, unspecified: Secondary | ICD-10-CM | POA: Diagnosis not present

## 2022-03-29 DIAGNOSIS — Z992 Dependence on renal dialysis: Secondary | ICD-10-CM | POA: Diagnosis not present

## 2022-03-29 DIAGNOSIS — N186 End stage renal disease: Secondary | ICD-10-CM | POA: Diagnosis not present

## 2022-03-29 DIAGNOSIS — D689 Coagulation defect, unspecified: Secondary | ICD-10-CM | POA: Diagnosis not present

## 2022-03-29 DIAGNOSIS — D631 Anemia in chronic kidney disease: Secondary | ICD-10-CM | POA: Diagnosis not present

## 2022-03-29 DIAGNOSIS — N2581 Secondary hyperparathyroidism of renal origin: Secondary | ICD-10-CM | POA: Diagnosis not present

## 2022-03-29 DIAGNOSIS — D509 Iron deficiency anemia, unspecified: Secondary | ICD-10-CM | POA: Diagnosis not present

## 2022-03-31 DIAGNOSIS — N2581 Secondary hyperparathyroidism of renal origin: Secondary | ICD-10-CM | POA: Diagnosis not present

## 2022-03-31 DIAGNOSIS — Z992 Dependence on renal dialysis: Secondary | ICD-10-CM | POA: Diagnosis not present

## 2022-03-31 DIAGNOSIS — D631 Anemia in chronic kidney disease: Secondary | ICD-10-CM | POA: Diagnosis not present

## 2022-03-31 DIAGNOSIS — N186 End stage renal disease: Secondary | ICD-10-CM | POA: Diagnosis not present

## 2022-03-31 DIAGNOSIS — D509 Iron deficiency anemia, unspecified: Secondary | ICD-10-CM | POA: Diagnosis not present

## 2022-03-31 DIAGNOSIS — D689 Coagulation defect, unspecified: Secondary | ICD-10-CM | POA: Diagnosis not present

## 2022-04-02 DIAGNOSIS — Z992 Dependence on renal dialysis: Secondary | ICD-10-CM | POA: Diagnosis not present

## 2022-04-02 DIAGNOSIS — N186 End stage renal disease: Secondary | ICD-10-CM | POA: Diagnosis not present

## 2022-04-02 DIAGNOSIS — N040 Nephrotic syndrome with minor glomerular abnormality: Secondary | ICD-10-CM | POA: Diagnosis not present

## 2022-04-03 DIAGNOSIS — N2581 Secondary hyperparathyroidism of renal origin: Secondary | ICD-10-CM | POA: Diagnosis not present

## 2022-04-03 DIAGNOSIS — N186 End stage renal disease: Secondary | ICD-10-CM | POA: Diagnosis not present

## 2022-04-03 DIAGNOSIS — R519 Headache, unspecified: Secondary | ICD-10-CM | POA: Diagnosis not present

## 2022-04-03 DIAGNOSIS — D689 Coagulation defect, unspecified: Secondary | ICD-10-CM | POA: Diagnosis not present

## 2022-04-03 DIAGNOSIS — D509 Iron deficiency anemia, unspecified: Secondary | ICD-10-CM | POA: Diagnosis not present

## 2022-04-03 DIAGNOSIS — Z992 Dependence on renal dialysis: Secondary | ICD-10-CM | POA: Diagnosis not present

## 2022-04-03 DIAGNOSIS — D631 Anemia in chronic kidney disease: Secondary | ICD-10-CM | POA: Diagnosis not present

## 2022-04-04 ENCOUNTER — Ambulatory Visit: Payer: Medicare Other | Admitting: Nurse Practitioner

## 2022-04-05 ENCOUNTER — Telehealth: Payer: Self-pay

## 2022-04-05 DIAGNOSIS — N186 End stage renal disease: Secondary | ICD-10-CM | POA: Diagnosis not present

## 2022-04-05 DIAGNOSIS — R519 Headache, unspecified: Secondary | ICD-10-CM | POA: Diagnosis not present

## 2022-04-05 DIAGNOSIS — D509 Iron deficiency anemia, unspecified: Secondary | ICD-10-CM | POA: Diagnosis not present

## 2022-04-05 DIAGNOSIS — D631 Anemia in chronic kidney disease: Secondary | ICD-10-CM | POA: Diagnosis not present

## 2022-04-05 DIAGNOSIS — D689 Coagulation defect, unspecified: Secondary | ICD-10-CM | POA: Diagnosis not present

## 2022-04-05 DIAGNOSIS — Z992 Dependence on renal dialysis: Secondary | ICD-10-CM | POA: Diagnosis not present

## 2022-04-05 DIAGNOSIS — N2581 Secondary hyperparathyroidism of renal origin: Secondary | ICD-10-CM | POA: Diagnosis not present

## 2022-04-05 NOTE — Telephone Encounter (Signed)
Left vm to confirm 04/12/22 appointment-Toni ?

## 2022-04-07 DIAGNOSIS — N2581 Secondary hyperparathyroidism of renal origin: Secondary | ICD-10-CM | POA: Diagnosis not present

## 2022-04-07 DIAGNOSIS — H35341 Macular cyst, hole, or pseudohole, right eye: Secondary | ICD-10-CM | POA: Diagnosis not present

## 2022-04-07 DIAGNOSIS — H2513 Age-related nuclear cataract, bilateral: Secondary | ICD-10-CM | POA: Diagnosis not present

## 2022-04-07 DIAGNOSIS — R519 Headache, unspecified: Secondary | ICD-10-CM | POA: Diagnosis not present

## 2022-04-07 DIAGNOSIS — D689 Coagulation defect, unspecified: Secondary | ICD-10-CM | POA: Diagnosis not present

## 2022-04-07 DIAGNOSIS — D509 Iron deficiency anemia, unspecified: Secondary | ICD-10-CM | POA: Diagnosis not present

## 2022-04-07 DIAGNOSIS — N186 End stage renal disease: Secondary | ICD-10-CM | POA: Diagnosis not present

## 2022-04-07 DIAGNOSIS — Z992 Dependence on renal dialysis: Secondary | ICD-10-CM | POA: Diagnosis not present

## 2022-04-07 DIAGNOSIS — D631 Anemia in chronic kidney disease: Secondary | ICD-10-CM | POA: Diagnosis not present

## 2022-04-07 DIAGNOSIS — H31011 Macula scars of posterior pole (postinflammatory) (post-traumatic), right eye: Secondary | ICD-10-CM | POA: Diagnosis not present

## 2022-04-10 DIAGNOSIS — R519 Headache, unspecified: Secondary | ICD-10-CM | POA: Diagnosis not present

## 2022-04-10 DIAGNOSIS — D689 Coagulation defect, unspecified: Secondary | ICD-10-CM | POA: Diagnosis not present

## 2022-04-10 DIAGNOSIS — Z992 Dependence on renal dialysis: Secondary | ICD-10-CM | POA: Diagnosis not present

## 2022-04-10 DIAGNOSIS — D509 Iron deficiency anemia, unspecified: Secondary | ICD-10-CM | POA: Diagnosis not present

## 2022-04-10 DIAGNOSIS — N2581 Secondary hyperparathyroidism of renal origin: Secondary | ICD-10-CM | POA: Diagnosis not present

## 2022-04-10 DIAGNOSIS — D631 Anemia in chronic kidney disease: Secondary | ICD-10-CM | POA: Diagnosis not present

## 2022-04-10 DIAGNOSIS — N186 End stage renal disease: Secondary | ICD-10-CM | POA: Diagnosis not present

## 2022-04-12 ENCOUNTER — Ambulatory Visit (INDEPENDENT_AMBULATORY_CARE_PROVIDER_SITE_OTHER): Payer: Medicare Other | Admitting: Nurse Practitioner

## 2022-04-12 ENCOUNTER — Encounter: Payer: Self-pay | Admitting: Nurse Practitioner

## 2022-04-12 VITALS — BP 145/65 | HR 85 | Temp 98.1°F | Resp 16 | Ht 63.0 in | Wt 124.2 lb

## 2022-04-12 DIAGNOSIS — Z23 Encounter for immunization: Secondary | ICD-10-CM

## 2022-04-12 DIAGNOSIS — Z992 Dependence on renal dialysis: Secondary | ICD-10-CM | POA: Diagnosis not present

## 2022-04-12 DIAGNOSIS — R011 Cardiac murmur, unspecified: Secondary | ICD-10-CM

## 2022-04-12 DIAGNOSIS — Z0001 Encounter for general adult medical examination with abnormal findings: Secondary | ICD-10-CM | POA: Diagnosis not present

## 2022-04-12 DIAGNOSIS — I700 Atherosclerosis of aorta: Secondary | ICD-10-CM

## 2022-04-12 DIAGNOSIS — R300 Dysuria: Secondary | ICD-10-CM

## 2022-04-12 DIAGNOSIS — N2581 Secondary hyperparathyroidism of renal origin: Secondary | ICD-10-CM | POA: Diagnosis not present

## 2022-04-12 DIAGNOSIS — E782 Mixed hyperlipidemia: Secondary | ICD-10-CM

## 2022-04-12 DIAGNOSIS — I6522 Occlusion and stenosis of left carotid artery: Secondary | ICD-10-CM

## 2022-04-12 DIAGNOSIS — E559 Vitamin D deficiency, unspecified: Secondary | ICD-10-CM | POA: Diagnosis not present

## 2022-04-12 DIAGNOSIS — K219 Gastro-esophageal reflux disease without esophagitis: Secondary | ICD-10-CM | POA: Diagnosis not present

## 2022-04-12 DIAGNOSIS — I10 Essential (primary) hypertension: Secondary | ICD-10-CM

## 2022-04-12 DIAGNOSIS — D689 Coagulation defect, unspecified: Secondary | ICD-10-CM | POA: Diagnosis not present

## 2022-04-12 DIAGNOSIS — E042 Nontoxic multinodular goiter: Secondary | ICD-10-CM

## 2022-04-12 DIAGNOSIS — D509 Iron deficiency anemia, unspecified: Secondary | ICD-10-CM | POA: Diagnosis not present

## 2022-04-12 DIAGNOSIS — N186 End stage renal disease: Secondary | ICD-10-CM

## 2022-04-12 DIAGNOSIS — D631 Anemia in chronic kidney disease: Secondary | ICD-10-CM | POA: Diagnosis not present

## 2022-04-12 DIAGNOSIS — R519 Headache, unspecified: Secondary | ICD-10-CM | POA: Diagnosis not present

## 2022-04-12 MED ORDER — AMLODIPINE BESYLATE 10 MG PO TABS: ORAL_TABLET | ORAL | 3 refills | Status: DC

## 2022-04-12 MED ORDER — OMEPRAZOLE 40 MG PO CPDR: 40.0000 mg | DELAYED_RELEASE_CAPSULE | Freq: Every day | ORAL | 3 refills | Status: DC

## 2022-04-12 MED ORDER — CARVEDILOL 25 MG PO TABS: 25.0000 mg | ORAL_TABLET | Freq: Two times a day (BID) | ORAL | 3 refills | Status: DC

## 2022-04-12 MED ORDER — HYDRALAZINE HCL 10 MG PO TABS: ORAL_TABLET | ORAL | 3 refills | Status: DC

## 2022-04-12 MED ORDER — PNEUMOCOCCAL 20-VAL CONJ VACC 0.5 ML IM SUSY: 0.5000 mL | PREFILLED_SYRINGE | INTRAMUSCULAR | 0 refills | Status: AC

## 2022-04-12 MED ORDER — ROSUVASTATIN CALCIUM 5 MG PO TABS: ORAL_TABLET | ORAL | 1 refills | Status: DC

## 2022-04-12 MED ORDER — ZOSTER VAC RECOMB ADJUVANTED 50 MCG/0.5ML IM SUSR: 0.5000 mL | Freq: Once | INTRAMUSCULAR | 0 refills | Status: AC

## 2022-04-12 NOTE — Progress Notes (Signed)
[facility] [place], [place] [address]  Internal MEDICINE  Office Visit Note  Patient Name: Kristen Francis  [id number]  [id number]  Date of Service: 04/12/2022  Chief Complaint  Patient presents with   Medicare Wellness   Hyperlipidemia   Hypertension   Anemia   Medication Refill    HPI Kristen Francis presents for an annual well visit and physical exam.  She is a well-appearing 76 year old female with end-stage renal disease on dialysis, chronic anemia, hypertension, diabetes, secondary hyperparathyroidism, GERD, aortic atherosclerosis, peripheral vascular disease, and a history of stroke and TIA. Her blood pressure is elevated today but improved when rechecked, see vitals.  Her other vital signs are stable and within normal limits. She had a routine colonoscopy in March this year, and she is due to repeat her routine colonoscopy in 2028.  She had a bone density scan performed in 2016.  Her most recent mammogram was in 2021, she had a routine screening mammogram followed by a diagnostic mammogram and ultrasound of the left breast.  The diagnostic mammogram and ultrasound resulted in BI-RADS Category 2 benign findings.  It was recommended to continue yearly screening mammograms at that time but the patient does not want to continue to have mammograms She had a bilateral carotid ultrasound in July last year with only mild plaque formation and less than 50% stenosis bilaterally. --She has an arteriovenous graft on the left upper extremity and she goes to dialysis 3 times a week.  The AVG is monitored by  vein and vascular surgery. She does need some medication refills today.  She is due for the pneumonia and shingles vaccinations.  She does get routine labs with dialysis but has not had a lipid panel or a vitamin D level checked in some time.    Current Medication: Outpatient Encounter Medications as of 04/12/2022  Medication Sig Note   aspirin EC 81 MG EC  tablet Take 1 tablet (81 mg total) by mouth daily. Swallow whole.    B Complex-C-Folic Acid (RENAL-VITE) 0.8 MG TABS Take 0.8 mg by mouth every evening. On dialysis days    cholecalciferol (VITAMIN D) 1000 units tablet Take 1,000 Units by mouth daily.     cinacalcet (SENSIPAR) 60 MG tablet Take 60 mg by mouth every evening.    ferrous sulfate 325 (65 FE) MG tablet Take 1 tablet (325 mg total) by mouth 2 (two) times daily with a meal.    lidocaine-prilocaine (EMLA) cream APPLY SMALL AMOUNT TO ACCESS SITE (AVF) 3 TIMES A WEEK 1 HOUR BEFORE DIALYSIS. COVER WITH OCCLUSIVE DRESSING (SARAN WRAP)    losartan (COZAAR) 50 MG tablet Take 50 mg by mouth daily. 06/03/2021: Last filled 02/02/21 90 days   sennosides-docusate sodium (SENOKOT-S) 8.6-50 MG tablet Take 2 tablets by mouth daily.    [DISCONTINUED] allopurinol (ZYLOPRIM) 100 MG tablet Take 1 tablet (100 mg total) by mouth daily.    [DISCONTINUED] amLODipine (NORVASC) 10 MG tablet TAKE 1 TABLET(10 MG) BY MOUTH DAILY    [DISCONTINUED] carvedilol (COREG) 25 MG tablet Take 25 mg by mouth 2 (two) times daily with a meal.    [DISCONTINUED] hydrALAZINE (APRESOLINE) 10 MG tablet TAKE 1 TABLET(10 MG) BY MOUTH TWICE DAILY    [DISCONTINUED] hydrochlorothiazide (HYDRODIURIL) 25 MG tablet Take 12.5 mg by mouth daily.    [DISCONTINUED] omeprazole (PRILOSEC) 40 MG capsule TAKE 1 CAPSULE BY MOUTH DAILY    [DISCONTINUED] pneumococcal 20-valent conjugate vaccine (PREVNAR 20) 0.5 ML injection Inject 0.5 mLs into the  muscle tomorrow at 10 am.    [DISCONTINUED] rosuvastatin (CRESTOR) 5 MG tablet TAKE 1 TABLET BY MOUTH DAILY. TAKE ON DAYS NOT HAVING DIALYSIS.    [DISCONTINUED] triamcinolone cream (KENALOG) 0.1 % Apply 1 application topically 2 (two) times daily.    [DISCONTINUED] Zoster Vaccine Adjuvanted [facility]) injection Inject 0.5 mLs into the muscle once.    amLODipine (NORVASC) 10 MG tablet TAKE 1 TABLET(10 MG) BY MOUTH DAILY    carvedilol (COREG) 25 MG tablet Take 1  tablet (25 mg total) by mouth 2 (two) times daily with a meal.    omeprazole (PRILOSEC) 40 MG capsule Take 1 capsule (40 mg total) by mouth daily.    [EXPIRED] pneumococcal 20-valent conjugate vaccine (PREVNAR 20) 0.5 ML injection Inject 0.5 mLs into the muscle tomorrow at 10 am for 1 dose.    sevelamer carbonate (RENVELA) 800 MG tablet Take 1,600 mg by mouth 3 (three) times daily.    VELPHORO 500 MG chewable tablet Chew 500 mg by mouth 3 (three) times daily.    [EXPIRED] Zoster Vaccine Adjuvanted [facility]) injection Inject 0.5 mLs into the muscle once for 1 dose.    [DISCONTINUED] hydrALAZINE (APRESOLINE) 10 MG tablet TAKE 1 TABLET(10 MG) BY MOUTH TWICE DAILY    [DISCONTINUED] mirtazapine (REMERON) 7.5 MG tablet Take 1 tablet (7.5 mg total) by mouth at bedtime. (Patient not taking: No sig reported)    [DISCONTINUED] promethazine (PHENERGAN) 12.5 MG tablet Take 1 tablet (12.5 mg total) by mouth every 8 (eight) hours as needed for nausea or vomiting. (Patient not taking: No sig reported)    [DISCONTINUED] rosuvastatin (CRESTOR) 5 MG tablet TAKE 1 TABLET BY MOUTH DAILY. TAKE ON DAYS NOT HAVING DIALYSIS.    No facility-administered encounter medications on file as of 04/12/2022.    Surgical History: Past Surgical History:  Procedure Laterality Date   A/V FISTULAGRAM Left 12/25/2018   Procedure: A/V FISTULAGRAM;  Surgeon: Katha Cabal, MD;  Location: [facility];  Service: Cardiovascular;  Laterality: Left;   ABDOMINAL HYSTERECTOMY     APPENDECTOMY     AV FISTULA PLACEMENT Left 07/12/2018   Procedure: INSERTION OF ARTERIOVENOUS (AV) GORE-TEX GRAFT ARM ( BRACHIAL AXILLARY );  Surgeon: Katha Cabal, MD;  Location: [facility] ORS;  Service: Vascular;  Laterality: Left;   COLONOSCOPY WITH PROPOFOL N/A 01/14/2016   Procedure: COLONOSCOPY WITH PROPOFOL;  Surgeon: Manya Silvas, MD;  Location: [facility] ENDOSCOPY;  Service: Endoscopy;  Laterality: N/A;   COLONOSCOPY WITH PROPOFOL N/A 02/14/2022    Procedure: COLONOSCOPY WITH PROPOFOL;  Surgeon: Lesly Rubenstein, MD;  Location: [facility] ENDOSCOPY;  Service: Endoscopy;  Laterality: N/A;   TONSILLECTOMY      Medical History: Past Medical History:  Diagnosis Date   Anemia    Arthritis    Chronic kidney disease    ESRD   Complication of anesthesia    had procedure and felt incision early 80s   Gout    Hyperlipemia    Hypertension    Macular degeneration, right eye    Stroke [facility])     Family History: Family History  Problem Relation Age of Onset   Kidney disease Mother    Breast cancer Neg Hx     Social History   Socioeconomic History   Marital status: Married    Spouse name: Not on file   Number of children: Not on file   Years of education: Not on file   Highest education level: Not on file  Occupational History  Not on file  Tobacco Use   Smoking status: Former   Smokeless tobacco: Never  Vaping Use   Vaping Use: Never used  Substance and Sexual Activity   Alcohol use: No   Drug use: No   Sexual activity: Not on file  Other Topics Concern   Not on file  Social History Narrative   Lives in  [place]; used to clean; no smoking; no alcohol.    Social Determinants of Health   Financial Resource Strain: Not on file  Food Insecurity: Not on file  Transportation Needs: Not on file  Physical Activity: Not on file  Stress: Not on file  Social Connections: Not on file  Intimate Partner Violence: Not on file      Review of Systems  Constitutional:  Positive for fatigue (Reports this is typical the day after she has dialysis). Negative for activity change, appetite change, chills, fever and unexpected weight change.  HENT: Negative.  Negative for congestion, ear pain, rhinorrhea, sore throat and trouble swallowing.   Eyes: Negative.   Respiratory: Negative.  Negative for cough, chest tightness, shortness of breath and wheezing.   Cardiovascular: Negative.  Negative for chest pain and palpitations.   Gastrointestinal: Negative.  Negative for abdominal pain, blood in stool, constipation, diarrhea, nausea and vomiting.  Endocrine: Negative.   Genitourinary:  Positive for decreased urine volume. Negative for difficulty urinating, dysuria, frequency, hematuria, pelvic pain and urgency.       Patient has and arteriovenous graft on the left upper extremity  Musculoskeletal:  Positive for arthralgias. Negative for back pain, joint swelling, myalgias and neck pain.  Skin: Negative.  Negative for rash and wound.  Allergic/Immunologic: Negative.  Negative for immunocompromised state.  Neurological: Negative.  Negative for dizziness, seizures, numbness and headaches.  Hematological: Negative.   Psychiatric/Behavioral: Negative.  Negative for behavioral problems, self-injury and suicidal ideas. The patient is not nervous/anxious.     Vital Signs: BP (!) 145/65 Comment: 173/82  Pulse 85   Temp 98.1 F (36.7 C)   Resp 16   Ht '5\' 3"'$  (1.6 m)   Wt 124 lb 3.2 oz (56.3 kg)   SpO2 98%   BMI 22.00 kg/m    Physical Exam Vitals reviewed.  Constitutional:      General: She is awake. She is not in acute distress.    Appearance: Normal appearance. She is well-developed, well-groomed and normal weight. She is ill-appearing. She is not diaphoretic.  HENT:     Head: Normocephalic and atraumatic.     Right Ear: Tympanic membrane, ear canal and external ear normal.     Left Ear: Tympanic membrane, ear canal and external ear normal.     Nose: Nose normal. No congestion or rhinorrhea.     Mouth/Throat:     Mouth: Mucous membranes are moist.     Pharynx: Oropharynx is clear. No oropharyngeal exudate or posterior oropharyngeal erythema.  Eyes:     General: No scleral icterus.       Right eye: No discharge.        Left eye: No discharge.     Extraocular Movements: Extraocular movements intact.     Conjunctiva/sclera: Conjunctivae normal.     Pupils: Pupils are equal, round, and reactive to light.   Neck:     Thyroid: No thyromegaly.     Vascular: No carotid bruit or JVD.     Trachea: No tracheal deviation.  Cardiovascular:     Rate and Rhythm: Normal rate and regular rhythm.  Pulses: Normal pulses.     Heart sounds: Murmur heard.     No friction rub. No gallop.     Arteriovenous access: Left arteriovenous access is present. Pulmonary:     Effort: Pulmonary effort is normal. No accessory muscle usage or respiratory distress.     Breath sounds: Normal breath sounds and air entry. No stridor. No wheezing or rales.  Chest:     Chest wall: No tenderness.     Comments: Declined clinical breast exam Abdominal:     General: Bowel sounds are normal. There is no distension.     Palpations: Abdomen is soft. There is no shifting dullness, fluid wave, mass or pulsatile mass.     Tenderness: There is no abdominal tenderness. There is no guarding or rebound.  Musculoskeletal:        General: No tenderness or deformity. Normal range of motion.     Cervical back: Normal range of motion and neck supple.     Right lower leg: No edema.     Left lower leg: No edema.  Lymphadenopathy:     Cervical: No cervical adenopathy.  Skin:    General: Skin is warm and dry.     Capillary Refill: Capillary refill takes less than 2 seconds.     Coloration: Skin is not pale.     Findings: No erythema or rash.  Neurological:     Mental Status: She is alert and oriented to person, place, and time.     Cranial Nerves: No cranial nerve deficit.     Motor: No abnormal muscle tone.     Coordination: Coordination normal.     Gait: Gait normal.     Deep Tendon Reflexes: Reflexes are normal and symmetric.  Psychiatric:        Mood and Affect: Mood normal.        Behavior: Behavior normal. Behavior is cooperative.        Thought Content: Thought content normal.        Judgment: Judgment normal.        Assessment/Plan: 1. Encounter for general adult medical examination with abnormal  findings Age-appropriate preventive screenings and vaccinations discussed, annual physical exam completed. Routine labs for health maintenance ordered, see below. PHM updated.  Patient declined clinical breast exam as well as routine mammogram.  Other than that all preventative screenings are up-to-date.  2. End stage renal disease ([place]) Patient has dialysis 3 times a week, medications for kidney disease refilled, she is also followed closely by nephrology. - VELPHORO 500 MG chewable tablet; Chew 500 mg by mouth 3 (three) times daily. - sevelamer carbonate (RENVELA) 800 MG tablet; Take 1,600 mg by mouth 3 (three) times daily.  3. Essential hypertension Blood pressure improved when rechecked, see vitals.  She is on multiple blood pressure medications and refills were sent, no changes were made at this time, her nephrologist also monitors and adjust her dialysate fluid to help control her blood pressures - amLODipine (NORVASC) 10 MG tablet; TAKE 1 TABLET(10 MG) BY MOUTH DAILY  Dispense: 90 tablet; Refill: 3 - carvedilol (COREG) 25 MG tablet; Take 1 tablet (25 mg total) by mouth 2 (two) times daily with a meal.  Dispense: 180 tablet; Refill: 3  4. Atherosclerosis of aorta (HCC) Routine lab ordered, patient takes rosuvastatin on the days that she does not have dialysis, refills ordered - Lipid Profile  5. Multinodular goiter Prior thyroid ultrasounds have been done to monitor the nodules in her thyroid gland, no intervention  necessary at this time  6. Murmur, cardiac Previously diagnosed cardiac murmur, still present with no significant change. An echocardiogram was ordered twice last year but was not done. Patient is not interested in having an echo done now but we will discuss again and readdress the issue at her next office visit.   7. Gastroesophageal reflux disease without esophagitis Acid reflux is well controlled, omeprazole refills ordered - omeprazole (PRILOSEC) 40 MG capsule; Take 1  capsule (40 mg total) by mouth daily.  Dispense: 90 capsule; Refill: 3  8. Vitamin D deficiency Routine lab ordered - Vitamin D (25 hydroxy)  9. Need for vaccination - Zoster Vaccine Adjuvanted [facility]) injection; Inject 0.5 mLs into the muscle once for 1 dose.  Dispense: 0.5 mL; Refill: 0 - pneumococcal 20-valent conjugate vaccine (PREVNAR 20) 0.5 ML injection; Inject 0.5 mLs into the muscle tomorrow at 10 am for 1 dose.  Dispense: 0.5 mL; Refill: 0    General Counseling: Kristen Francis verbalizes understanding of the findings of todays visit and agrees with plan of treatment. I have discussed any further diagnostic evaluation that may be needed or ordered today. We also reviewed her medications today. she has been encouraged to call the office with any questions or concerns that should arise related to todays visit.    Orders Placed This Encounter  Procedures   Lipid Profile   Vitamin D (25 hydroxy)    Meds ordered this encounter  Medications   Zoster Vaccine Adjuvanted [facility]) injection    Sig: Inject 0.5 mLs into the muscle once for 1 dose.    Dispense:  0.5 mL    Refill:  0   pneumococcal 20-valent conjugate vaccine (PREVNAR 20) 0.5 ML injection    Sig: Inject 0.5 mLs into the muscle tomorrow at 10 am for 1 dose.    Dispense:  0.5 mL    Refill:  0   DISCONTD: rosuvastatin (CRESTOR) 5 MG tablet    Sig: TAKE 1 TABLET BY MOUTH DAILY. TAKE ON DAYS NOT HAVING DIALYSIS.    Dispense:  90 tablet    Refill:  1   amLODipine (NORVASC) 10 MG tablet    Sig: TAKE 1 TABLET(10 MG) BY MOUTH DAILY    Dispense:  90 tablet    Refill:  3    **Patient requests 90 days supply**   omeprazole (PRILOSEC) 40 MG capsule    Sig: Take 1 capsule (40 mg total) by mouth daily.    Dispense:  90 capsule    Refill:  3   carvedilol (COREG) 25 MG tablet    Sig: Take 1 tablet (25 mg total) by mouth 2 (two) times daily with a meal.    Dispense:  180 tablet    Refill:  3   DISCONTD: hydrALAZINE (APRESOLINE)  10 MG tablet    Sig: TAKE 1 TABLET(10 MG) BY MOUTH TWICE DAILY    Dispense:  180 tablet    Refill:  3    Return in about 6 months (around 10/13/2022) for F/U, med refill, Rielly Brunn PCP.   Total time spent:30 Minutes Time spent includes review of chart, medications, test results, and follow up plan with the patient.   [place] Controlled Substance Database was reviewed by me.  This patient was seen by Jonetta Osgood, FNP-C in collaboration with Dr. Clayborn Bigness as a part of collaborative care agreement.  Miguelina Fore R. Valetta Fuller, MSN, FNP-C Internal medicine

## 2022-04-14 DIAGNOSIS — N186 End stage renal disease: Secondary | ICD-10-CM | POA: Diagnosis not present

## 2022-04-14 DIAGNOSIS — D509 Iron deficiency anemia, unspecified: Secondary | ICD-10-CM | POA: Diagnosis not present

## 2022-04-14 DIAGNOSIS — N2581 Secondary hyperparathyroidism of renal origin: Secondary | ICD-10-CM | POA: Diagnosis not present

## 2022-04-14 DIAGNOSIS — D689 Coagulation defect, unspecified: Secondary | ICD-10-CM | POA: Diagnosis not present

## 2022-04-14 DIAGNOSIS — D631 Anemia in chronic kidney disease: Secondary | ICD-10-CM | POA: Diagnosis not present

## 2022-04-14 DIAGNOSIS — Z992 Dependence on renal dialysis: Secondary | ICD-10-CM | POA: Diagnosis not present

## 2022-04-14 DIAGNOSIS — R519 Headache, unspecified: Secondary | ICD-10-CM | POA: Diagnosis not present

## 2022-04-17 DIAGNOSIS — N186 End stage renal disease: Secondary | ICD-10-CM | POA: Diagnosis not present

## 2022-04-17 DIAGNOSIS — R519 Headache, unspecified: Secondary | ICD-10-CM | POA: Diagnosis not present

## 2022-04-17 DIAGNOSIS — D509 Iron deficiency anemia, unspecified: Secondary | ICD-10-CM | POA: Diagnosis not present

## 2022-04-17 DIAGNOSIS — Z992 Dependence on renal dialysis: Secondary | ICD-10-CM | POA: Diagnosis not present

## 2022-04-17 DIAGNOSIS — N2581 Secondary hyperparathyroidism of renal origin: Secondary | ICD-10-CM | POA: Diagnosis not present

## 2022-04-17 DIAGNOSIS — D689 Coagulation defect, unspecified: Secondary | ICD-10-CM | POA: Diagnosis not present

## 2022-04-17 DIAGNOSIS — D631 Anemia in chronic kidney disease: Secondary | ICD-10-CM | POA: Diagnosis not present

## 2022-04-19 DIAGNOSIS — N186 End stage renal disease: Secondary | ICD-10-CM | POA: Diagnosis not present

## 2022-04-19 DIAGNOSIS — D509 Iron deficiency anemia, unspecified: Secondary | ICD-10-CM | POA: Diagnosis not present

## 2022-04-19 DIAGNOSIS — Z992 Dependence on renal dialysis: Secondary | ICD-10-CM | POA: Diagnosis not present

## 2022-04-19 DIAGNOSIS — D631 Anemia in chronic kidney disease: Secondary | ICD-10-CM | POA: Diagnosis not present

## 2022-04-19 DIAGNOSIS — D689 Coagulation defect, unspecified: Secondary | ICD-10-CM | POA: Diagnosis not present

## 2022-04-19 DIAGNOSIS — N2581 Secondary hyperparathyroidism of renal origin: Secondary | ICD-10-CM | POA: Diagnosis not present

## 2022-04-19 DIAGNOSIS — R519 Headache, unspecified: Secondary | ICD-10-CM | POA: Diagnosis not present

## 2022-04-21 DIAGNOSIS — D689 Coagulation defect, unspecified: Secondary | ICD-10-CM | POA: Diagnosis not present

## 2022-04-21 DIAGNOSIS — Z992 Dependence on renal dialysis: Secondary | ICD-10-CM | POA: Diagnosis not present

## 2022-04-21 DIAGNOSIS — D631 Anemia in chronic kidney disease: Secondary | ICD-10-CM | POA: Diagnosis not present

## 2022-04-21 DIAGNOSIS — N2581 Secondary hyperparathyroidism of renal origin: Secondary | ICD-10-CM | POA: Diagnosis not present

## 2022-04-21 DIAGNOSIS — D509 Iron deficiency anemia, unspecified: Secondary | ICD-10-CM | POA: Diagnosis not present

## 2022-04-21 DIAGNOSIS — N186 End stage renal disease: Secondary | ICD-10-CM | POA: Diagnosis not present

## 2022-04-21 DIAGNOSIS — R519 Headache, unspecified: Secondary | ICD-10-CM | POA: Diagnosis not present

## 2022-04-24 DIAGNOSIS — N186 End stage renal disease: Secondary | ICD-10-CM | POA: Diagnosis not present

## 2022-04-24 DIAGNOSIS — N2581 Secondary hyperparathyroidism of renal origin: Secondary | ICD-10-CM | POA: Diagnosis not present

## 2022-04-24 DIAGNOSIS — R519 Headache, unspecified: Secondary | ICD-10-CM | POA: Diagnosis not present

## 2022-04-24 DIAGNOSIS — D509 Iron deficiency anemia, unspecified: Secondary | ICD-10-CM | POA: Diagnosis not present

## 2022-04-24 DIAGNOSIS — Z992 Dependence on renal dialysis: Secondary | ICD-10-CM | POA: Diagnosis not present

## 2022-04-24 DIAGNOSIS — D631 Anemia in chronic kidney disease: Secondary | ICD-10-CM | POA: Diagnosis not present

## 2022-04-24 DIAGNOSIS — D689 Coagulation defect, unspecified: Secondary | ICD-10-CM | POA: Diagnosis not present

## 2022-04-26 DIAGNOSIS — D631 Anemia in chronic kidney disease: Secondary | ICD-10-CM | POA: Diagnosis not present

## 2022-04-26 DIAGNOSIS — D689 Coagulation defect, unspecified: Secondary | ICD-10-CM | POA: Diagnosis not present

## 2022-04-26 DIAGNOSIS — D509 Iron deficiency anemia, unspecified: Secondary | ICD-10-CM | POA: Diagnosis not present

## 2022-04-26 DIAGNOSIS — N186 End stage renal disease: Secondary | ICD-10-CM | POA: Diagnosis not present

## 2022-04-26 DIAGNOSIS — R519 Headache, unspecified: Secondary | ICD-10-CM | POA: Diagnosis not present

## 2022-04-26 DIAGNOSIS — Z992 Dependence on renal dialysis: Secondary | ICD-10-CM | POA: Diagnosis not present

## 2022-04-26 DIAGNOSIS — N2581 Secondary hyperparathyroidism of renal origin: Secondary | ICD-10-CM | POA: Diagnosis not present

## 2022-04-28 DIAGNOSIS — D689 Coagulation defect, unspecified: Secondary | ICD-10-CM | POA: Diagnosis not present

## 2022-04-28 DIAGNOSIS — D509 Iron deficiency anemia, unspecified: Secondary | ICD-10-CM | POA: Diagnosis not present

## 2022-04-28 DIAGNOSIS — R519 Headache, unspecified: Secondary | ICD-10-CM | POA: Diagnosis not present

## 2022-04-28 DIAGNOSIS — N186 End stage renal disease: Secondary | ICD-10-CM | POA: Diagnosis not present

## 2022-04-28 DIAGNOSIS — N2581 Secondary hyperparathyroidism of renal origin: Secondary | ICD-10-CM | POA: Diagnosis not present

## 2022-04-28 DIAGNOSIS — Z992 Dependence on renal dialysis: Secondary | ICD-10-CM | POA: Diagnosis not present

## 2022-04-28 DIAGNOSIS — D631 Anemia in chronic kidney disease: Secondary | ICD-10-CM | POA: Diagnosis not present

## 2022-04-30 ENCOUNTER — Other Ambulatory Visit: Payer: Self-pay | Admitting: Internal Medicine

## 2022-04-30 DIAGNOSIS — I6522 Occlusion and stenosis of left carotid artery: Secondary | ICD-10-CM

## 2022-05-01 DIAGNOSIS — N2581 Secondary hyperparathyroidism of renal origin: Secondary | ICD-10-CM | POA: Diagnosis not present

## 2022-05-01 DIAGNOSIS — N186 End stage renal disease: Secondary | ICD-10-CM | POA: Diagnosis not present

## 2022-05-01 DIAGNOSIS — D631 Anemia in chronic kidney disease: Secondary | ICD-10-CM | POA: Diagnosis not present

## 2022-05-01 DIAGNOSIS — D689 Coagulation defect, unspecified: Secondary | ICD-10-CM | POA: Diagnosis not present

## 2022-05-01 DIAGNOSIS — Z992 Dependence on renal dialysis: Secondary | ICD-10-CM | POA: Diagnosis not present

## 2022-05-01 DIAGNOSIS — D509 Iron deficiency anemia, unspecified: Secondary | ICD-10-CM | POA: Diagnosis not present

## 2022-05-01 DIAGNOSIS — R519 Headache, unspecified: Secondary | ICD-10-CM | POA: Diagnosis not present

## 2022-05-02 ENCOUNTER — Telehealth: Payer: Self-pay

## 2022-05-02 ENCOUNTER — Other Ambulatory Visit (INDEPENDENT_AMBULATORY_CARE_PROVIDER_SITE_OTHER): Payer: Self-pay | Admitting: Nurse Practitioner

## 2022-05-02 DIAGNOSIS — N186 End stage renal disease: Secondary | ICD-10-CM

## 2022-05-03 ENCOUNTER — Ambulatory Visit (INDEPENDENT_AMBULATORY_CARE_PROVIDER_SITE_OTHER): Payer: Medicare Other

## 2022-05-03 ENCOUNTER — Telehealth: Payer: Self-pay

## 2022-05-03 ENCOUNTER — Ambulatory Visit (INDEPENDENT_AMBULATORY_CARE_PROVIDER_SITE_OTHER): Payer: Medicare Other | Admitting: Nurse Practitioner

## 2022-05-03 ENCOUNTER — Encounter (INDEPENDENT_AMBULATORY_CARE_PROVIDER_SITE_OTHER): Payer: Self-pay | Admitting: Nurse Practitioner

## 2022-05-03 VITALS — BP 179/74 | HR 73 | Resp 16 | Wt 126.6 lb

## 2022-05-03 DIAGNOSIS — N186 End stage renal disease: Secondary | ICD-10-CM

## 2022-05-03 DIAGNOSIS — D509 Iron deficiency anemia, unspecified: Secondary | ICD-10-CM | POA: Diagnosis not present

## 2022-05-03 DIAGNOSIS — R519 Headache, unspecified: Secondary | ICD-10-CM | POA: Diagnosis not present

## 2022-05-03 DIAGNOSIS — D689 Coagulation defect, unspecified: Secondary | ICD-10-CM | POA: Diagnosis not present

## 2022-05-03 DIAGNOSIS — E1129 Type 2 diabetes mellitus with other diabetic kidney complication: Secondary | ICD-10-CM | POA: Diagnosis not present

## 2022-05-03 DIAGNOSIS — D631 Anemia in chronic kidney disease: Secondary | ICD-10-CM | POA: Diagnosis not present

## 2022-05-03 DIAGNOSIS — N040 Nephrotic syndrome with minor glomerular abnormality: Secondary | ICD-10-CM | POA: Diagnosis not present

## 2022-05-03 DIAGNOSIS — N2581 Secondary hyperparathyroidism of renal origin: Secondary | ICD-10-CM | POA: Diagnosis not present

## 2022-05-03 DIAGNOSIS — Z992 Dependence on renal dialysis: Secondary | ICD-10-CM | POA: Diagnosis not present

## 2022-05-05 DIAGNOSIS — Z992 Dependence on renal dialysis: Secondary | ICD-10-CM | POA: Diagnosis not present

## 2022-05-05 DIAGNOSIS — N2581 Secondary hyperparathyroidism of renal origin: Secondary | ICD-10-CM | POA: Diagnosis not present

## 2022-05-05 DIAGNOSIS — D689 Coagulation defect, unspecified: Secondary | ICD-10-CM | POA: Diagnosis not present

## 2022-05-05 DIAGNOSIS — R519 Headache, unspecified: Secondary | ICD-10-CM | POA: Diagnosis not present

## 2022-05-05 DIAGNOSIS — D509 Iron deficiency anemia, unspecified: Secondary | ICD-10-CM | POA: Diagnosis not present

## 2022-05-05 DIAGNOSIS — D631 Anemia in chronic kidney disease: Secondary | ICD-10-CM | POA: Diagnosis not present

## 2022-05-05 DIAGNOSIS — N186 End stage renal disease: Secondary | ICD-10-CM | POA: Diagnosis not present

## 2022-05-08 DIAGNOSIS — D509 Iron deficiency anemia, unspecified: Secondary | ICD-10-CM | POA: Diagnosis not present

## 2022-05-08 DIAGNOSIS — D631 Anemia in chronic kidney disease: Secondary | ICD-10-CM | POA: Diagnosis not present

## 2022-05-08 DIAGNOSIS — N2581 Secondary hyperparathyroidism of renal origin: Secondary | ICD-10-CM | POA: Diagnosis not present

## 2022-05-08 DIAGNOSIS — N186 End stage renal disease: Secondary | ICD-10-CM | POA: Diagnosis not present

## 2022-05-08 DIAGNOSIS — Z992 Dependence on renal dialysis: Secondary | ICD-10-CM | POA: Diagnosis not present

## 2022-05-08 DIAGNOSIS — R519 Headache, unspecified: Secondary | ICD-10-CM | POA: Diagnosis not present

## 2022-05-08 DIAGNOSIS — D689 Coagulation defect, unspecified: Secondary | ICD-10-CM | POA: Diagnosis not present

## 2022-05-10 DIAGNOSIS — N186 End stage renal disease: Secondary | ICD-10-CM | POA: Diagnosis not present

## 2022-05-10 DIAGNOSIS — Z992 Dependence on renal dialysis: Secondary | ICD-10-CM | POA: Diagnosis not present

## 2022-05-10 DIAGNOSIS — N2581 Secondary hyperparathyroidism of renal origin: Secondary | ICD-10-CM | POA: Diagnosis not present

## 2022-05-10 DIAGNOSIS — D689 Coagulation defect, unspecified: Secondary | ICD-10-CM | POA: Diagnosis not present

## 2022-05-10 DIAGNOSIS — D509 Iron deficiency anemia, unspecified: Secondary | ICD-10-CM | POA: Diagnosis not present

## 2022-05-10 DIAGNOSIS — R519 Headache, unspecified: Secondary | ICD-10-CM | POA: Diagnosis not present

## 2022-05-10 DIAGNOSIS — D631 Anemia in chronic kidney disease: Secondary | ICD-10-CM | POA: Diagnosis not present

## 2022-05-12 DIAGNOSIS — D509 Iron deficiency anemia, unspecified: Secondary | ICD-10-CM | POA: Diagnosis not present

## 2022-05-12 DIAGNOSIS — D689 Coagulation defect, unspecified: Secondary | ICD-10-CM | POA: Diagnosis not present

## 2022-05-12 DIAGNOSIS — N2581 Secondary hyperparathyroidism of renal origin: Secondary | ICD-10-CM | POA: Diagnosis not present

## 2022-05-12 DIAGNOSIS — N186 End stage renal disease: Secondary | ICD-10-CM | POA: Diagnosis not present

## 2022-05-12 DIAGNOSIS — Z992 Dependence on renal dialysis: Secondary | ICD-10-CM | POA: Diagnosis not present

## 2022-05-12 DIAGNOSIS — D631 Anemia in chronic kidney disease: Secondary | ICD-10-CM | POA: Diagnosis not present

## 2022-05-12 DIAGNOSIS — R519 Headache, unspecified: Secondary | ICD-10-CM | POA: Diagnosis not present

## 2022-05-13 ENCOUNTER — Encounter (INDEPENDENT_AMBULATORY_CARE_PROVIDER_SITE_OTHER): Payer: Self-pay | Admitting: Nurse Practitioner

## 2022-05-13 NOTE — Progress Notes (Signed)
Subjective:    Patient ID: Kristen Francis, female    DOB: September 15, 1946, 76 y.o.   MRN: [id number] Chief Complaint  Patient presents with   Follow-up    Ultrasound follow up    The patient returns to the office for followup of their dialysis access.   The patient reports the function of the access has been stable. Patient denies difficulty with cannulation. The patient denies increased bleeding time after removing the needles. The patient denies hand pain or other symptoms consistent with steal phenomena.  No significant arm swelling.  The patient notes that it has been several years but nothing significant.  She notes that it is largely dependent on which technicians have stuck her prior to the bleeding beginning  The patient denies redness or swelling at the access site. The patient denies fever or chills at home or while on dialysis.  No recent shortening of the patient's walking distance or new symptoms consistent with claudication.  No history of rest pain symptoms. No new ulcers or wounds of the lower extremities have occurred.  The patient denies amaurosis fugax or recent TIA symptoms. There are no recent neurological changes noted. There is no history of DVT, PE or superficial thrombophlebitis. No recent episodes of angina or shortness of breath documented.   Duplex ultrasound of the AV access shows a patent access.  The previously noted stenosis is not significantly changed compared to last study.  Flow volume today is 1418 cc/min (previous flow volume was 1473 cc/min)       Review of Systems     Objective:   Physical Exam  BP (!) 179/74 (BP Location: Right Arm)   Pulse 73   Resp 16   Wt 126 lb 9.6 oz (57.4 kg)   BMI 22.43 kg/m   Past Medical History:  Diagnosis Date   Anemia    Arthritis    Chronic kidney disease    ESRD   Complication of anesthesia    had procedure and felt incision early 80s   Gout    Hyperlipemia    Hypertension    Macular  degeneration, right eye    Stroke [facility])     Social History   Socioeconomic History   Marital status: Married    Spouse name: Not on file   Number of children: Not on file   Years of education: Not on file   Highest education level: Not on file  Occupational History   Not on file  Tobacco Use   Smoking status: Former   Smokeless tobacco: Never  Vaping Use   Vaping Use: Never used  Substance and Sexual Activity   Alcohol use: No   Drug use: No   Sexual activity: Not on file  Other Topics Concern   Not on file  Social History Narrative   Lives in  [place]; used to clean; no smoking; no alcohol.    Social Determinants of Health   Financial Resource Strain: Not on file  Food Insecurity: Not on file  Transportation Needs: Not on file  Physical Activity: Not on file  Stress: Not on file  Social Connections: Not on file  Intimate Partner Violence: Not on file    Past Surgical History:  Procedure Laterality Date   A/V FISTULAGRAM Left 12/25/2018   Procedure: A/V FISTULAGRAM;  Surgeon: Katha Cabal, MD;  Location: [place] [facility];  Service: Cardiovascular;  Laterality: Left;   ABDOMINAL HYSTERECTOMY     APPENDECTOMY  AV FISTULA PLACEMENT Left 07/12/2018   Procedure: INSERTION OF ARTERIOVENOUS (AV) GORE-TEX GRAFT ARM ( BRACHIAL AXILLARY );  Surgeon: Katha Cabal, MD;  Location: [facility] ORS;  Service: Vascular;  Laterality: Left;   COLONOSCOPY WITH PROPOFOL N/A 01/14/2016   Procedure: COLONOSCOPY WITH PROPOFOL;  Surgeon: Manya Silvas, MD;  Location: [facility] ENDOSCOPY;  Service: Endoscopy;  Laterality: N/A;   COLONOSCOPY WITH PROPOFOL N/A 02/14/2022   Procedure: COLONOSCOPY WITH PROPOFOL;  Surgeon: Lesly Rubenstein, MD;  Location: [facility] ENDOSCOPY;  Service: Endoscopy;  Laterality: N/A;   TONSILLECTOMY      Family History  Problem Relation Age of Onset   Kidney disease Mother    Breast cancer Neg Hx     Allergies  Allergen Reactions   Other Other (See  Comments)    Pt does not receive Blood    Penicillins Hives    Has patient had a PCN reaction causing immediate rash, facial/tongue/throat swelling, SOB or lightheadedness with hypotension: YES Has patient had a PCN reaction causing severe rash involving mucus membranes or skin necrosis: no Has patient had a PCN reaction that required hospitalization: no Has patient had a PCN reaction occurring within the last 10 years: no If all of the above answers are "NO", then may proceed with Cephalosporin use.    Sodium Bicarbonate Itching       Latest Ref Rng & Units 06/04/2021    7:39 AM 06/03/2021    2:07 PM 06/03/2021   10:30 AM  CBC  WBC 4.0 - 10.5 K/uL 5.8  7.5    Hemoglobin 12.0 - 15.0 g/dL 6.1  6.6  6.3   Hematocrit 36.0 - 46.0 % 19.1  20.3    Platelets 150 - 400 K/uL 197  204        CMP     Component Value Date/Time   NA 139 06/03/2021 1407   NA 139 07/02/2020 1105   K 3.4 (L) 06/03/2021 1407   CL 100 06/03/2021 1407   CO2 32 06/03/2021 1407   GLUCOSE 136 (H) 06/03/2021 1407   BUN 21 06/03/2021 1407   BUN 13 07/02/2020 1105   CREATININE 5.64 (H) 06/04/2021 0739   CALCIUM 8.4 (L) 06/03/2021 1407   PROT 6.9 06/03/2021 1407   PROT 6.6 07/02/2020 1105   ALBUMIN 3.7 06/03/2021 1407   ALBUMIN 4.0 07/02/2020 1105   AST 19 06/03/2021 1407   ALT 12 06/03/2021 1407   ALKPHOS 46 06/03/2021 1407   BILITOT 0.5 06/03/2021 1407   BILITOT 0.3 07/02/2020 1105   GFRNONAA 7 (L) 06/04/2021 0739   GFRAA 8 (L) 07/20/2020 1202     No results found.     Assessment & Plan:   1. ESRD (end stage renal disease) ([facility]) Recommend:  The patient is doing well and currently has adequate dialysis access.  Although there are some parameters suggesting possible future issues.  The patient currently denies that she is having any issues with bleeding from the referral.  The patient will follow-up with me in the office in 3 months.  - VAS US DUPLEX DIALYSIS ACCESS (AVF, AVG)  2. Type 2 diabetes  mellitus with other diabetic kidney complication (HCC) Continue hypoglycemic medications as already ordered, these medications have been reviewed and there are no changes at this time.  Hgb A1C to be monitored as already arranged by primary service    Current Outpatient Medications on File Prior to Visit  Medication Sig Dispense Refill   amLODipine (NORVASC) 10 MG tablet TAKE  1 TABLET(10 MG) BY MOUTH DAILY 90 tablet 3   aspirin EC 81 MG EC tablet Take 1 tablet (81 mg total) by mouth daily. Swallow whole. 30 tablet 0   B Complex-C-Folic Acid (RENAL-VITE) 0.8 MG TABS Take 0.8 mg by mouth every evening. On dialysis days     carvedilol (COREG) 25 MG tablet Take 1 tablet (25 mg total) by mouth 2 (two) times daily with a meal. 180 tablet 3   cholecalciferol (VITAMIN D) 1000 units tablet Take 1,000 Units by mouth daily.      cinacalcet (SENSIPAR) 60 MG tablet Take 60 mg by mouth every evening.     ferrous sulfate 325 (65 FE) MG tablet Take 1 tablet (325 mg total) by mouth 2 (two) times daily with a meal. 60 tablet 2   hydrALAZINE (APRESOLINE) 10 MG tablet TAKE 1 TABLET(10 MG) BY MOUTH TWICE DAILY 180 tablet 3   lidocaine-prilocaine (EMLA) cream APPLY SMALL AMOUNT TO ACCESS SITE (AVF) 3 TIMES A WEEK 1 HOUR BEFORE DIALYSIS. COVER WITH OCCLUSIVE DRESSING (SARAN WRAP)  6   losartan (COZAAR) 50 MG tablet Take 50 mg by mouth daily.     omeprazole (PRILOSEC) 40 MG capsule Take 1 capsule (40 mg total) by mouth daily. 90 capsule 3   rosuvastatin (CRESTOR) 5 MG tablet TAKE 1 TABLET BY MOUTH DAILY. TAKE ON DAYS NOT HAVING DIALYSIS 90 tablet 1   sennosides-docusate sodium (SENOKOT-S) 8.6-50 MG tablet Take 2 tablets by mouth daily.     sevelamer carbonate (RENVELA) 800 MG tablet Take 1,600 mg by mouth 3 (three) times daily.     VELPHORO 500 MG chewable tablet Chew 500 mg by mouth 3 (three) times daily.     [DISCONTINUED] mirtazapine (REMERON) 7.5 MG tablet Take 1 tablet (7.5 mg total) by mouth at bedtime.  (Patient not taking: No sig reported) 30 tablet 3   [DISCONTINUED] promethazine (PHENERGAN) 12.5 MG tablet Take 1 tablet (12.5 mg total) by mouth every 8 (eight) hours as needed for nausea or vomiting. (Patient not taking: No sig reported) 20 tablet 0   No current facility-administered medications on file prior to visit.    There are no Patient Instructions on file for this visit. No follow-ups on file.   Kris Hartmann, NP

## 2022-05-15 DIAGNOSIS — R519 Headache, unspecified: Secondary | ICD-10-CM | POA: Diagnosis not present

## 2022-05-15 DIAGNOSIS — D509 Iron deficiency anemia, unspecified: Secondary | ICD-10-CM | POA: Diagnosis not present

## 2022-05-15 DIAGNOSIS — N186 End stage renal disease: Secondary | ICD-10-CM | POA: Diagnosis not present

## 2022-05-15 DIAGNOSIS — Z992 Dependence on renal dialysis: Secondary | ICD-10-CM | POA: Diagnosis not present

## 2022-05-15 DIAGNOSIS — D689 Coagulation defect, unspecified: Secondary | ICD-10-CM | POA: Diagnosis not present

## 2022-05-15 DIAGNOSIS — D631 Anemia in chronic kidney disease: Secondary | ICD-10-CM | POA: Diagnosis not present

## 2022-05-15 DIAGNOSIS — N2581 Secondary hyperparathyroidism of renal origin: Secondary | ICD-10-CM | POA: Diagnosis not present

## 2022-05-17 DIAGNOSIS — Z992 Dependence on renal dialysis: Secondary | ICD-10-CM | POA: Diagnosis not present

## 2022-05-17 DIAGNOSIS — N186 End stage renal disease: Secondary | ICD-10-CM | POA: Diagnosis not present

## 2022-05-17 DIAGNOSIS — R519 Headache, unspecified: Secondary | ICD-10-CM | POA: Diagnosis not present

## 2022-05-17 DIAGNOSIS — N2581 Secondary hyperparathyroidism of renal origin: Secondary | ICD-10-CM | POA: Diagnosis not present

## 2022-05-17 DIAGNOSIS — D689 Coagulation defect, unspecified: Secondary | ICD-10-CM | POA: Diagnosis not present

## 2022-05-17 DIAGNOSIS — D509 Iron deficiency anemia, unspecified: Secondary | ICD-10-CM | POA: Diagnosis not present

## 2022-05-17 DIAGNOSIS — D631 Anemia in chronic kidney disease: Secondary | ICD-10-CM | POA: Diagnosis not present

## 2022-05-19 ENCOUNTER — Other Ambulatory Visit: Payer: Self-pay | Admitting: Physician Assistant

## 2022-05-19 DIAGNOSIS — D509 Iron deficiency anemia, unspecified: Secondary | ICD-10-CM | POA: Diagnosis not present

## 2022-05-19 DIAGNOSIS — Z992 Dependence on renal dialysis: Secondary | ICD-10-CM | POA: Diagnosis not present

## 2022-05-19 DIAGNOSIS — D631 Anemia in chronic kidney disease: Secondary | ICD-10-CM | POA: Diagnosis not present

## 2022-05-19 DIAGNOSIS — D689 Coagulation defect, unspecified: Secondary | ICD-10-CM | POA: Diagnosis not present

## 2022-05-19 DIAGNOSIS — R519 Headache, unspecified: Secondary | ICD-10-CM | POA: Diagnosis not present

## 2022-05-19 DIAGNOSIS — N186 End stage renal disease: Secondary | ICD-10-CM | POA: Diagnosis not present

## 2022-05-19 DIAGNOSIS — N2581 Secondary hyperparathyroidism of renal origin: Secondary | ICD-10-CM | POA: Diagnosis not present

## 2022-05-19 DIAGNOSIS — I10 Essential (primary) hypertension: Secondary | ICD-10-CM

## 2022-05-19 NOTE — Telephone Encounter (Signed)
error 

## 2022-05-22 DIAGNOSIS — D689 Coagulation defect, unspecified: Secondary | ICD-10-CM | POA: Diagnosis not present

## 2022-05-22 DIAGNOSIS — D631 Anemia in chronic kidney disease: Secondary | ICD-10-CM | POA: Diagnosis not present

## 2022-05-22 DIAGNOSIS — N2581 Secondary hyperparathyroidism of renal origin: Secondary | ICD-10-CM | POA: Diagnosis not present

## 2022-05-22 DIAGNOSIS — Z992 Dependence on renal dialysis: Secondary | ICD-10-CM | POA: Diagnosis not present

## 2022-05-22 DIAGNOSIS — N186 End stage renal disease: Secondary | ICD-10-CM | POA: Diagnosis not present

## 2022-05-22 DIAGNOSIS — D509 Iron deficiency anemia, unspecified: Secondary | ICD-10-CM | POA: Diagnosis not present

## 2022-05-22 DIAGNOSIS — R519 Headache, unspecified: Secondary | ICD-10-CM | POA: Diagnosis not present

## 2022-05-24 DIAGNOSIS — D631 Anemia in chronic kidney disease: Secondary | ICD-10-CM | POA: Diagnosis not present

## 2022-05-24 DIAGNOSIS — R519 Headache, unspecified: Secondary | ICD-10-CM | POA: Diagnosis not present

## 2022-05-24 DIAGNOSIS — D689 Coagulation defect, unspecified: Secondary | ICD-10-CM | POA: Diagnosis not present

## 2022-05-24 DIAGNOSIS — D509 Iron deficiency anemia, unspecified: Secondary | ICD-10-CM | POA: Diagnosis not present

## 2022-05-24 DIAGNOSIS — N2581 Secondary hyperparathyroidism of renal origin: Secondary | ICD-10-CM | POA: Diagnosis not present

## 2022-05-24 DIAGNOSIS — Z992 Dependence on renal dialysis: Secondary | ICD-10-CM | POA: Diagnosis not present

## 2022-05-24 DIAGNOSIS — N186 End stage renal disease: Secondary | ICD-10-CM | POA: Diagnosis not present

## 2022-05-26 DIAGNOSIS — D689 Coagulation defect, unspecified: Secondary | ICD-10-CM | POA: Diagnosis not present

## 2022-05-26 DIAGNOSIS — N186 End stage renal disease: Secondary | ICD-10-CM | POA: Diagnosis not present

## 2022-05-26 DIAGNOSIS — R519 Headache, unspecified: Secondary | ICD-10-CM | POA: Diagnosis not present

## 2022-05-26 DIAGNOSIS — D631 Anemia in chronic kidney disease: Secondary | ICD-10-CM | POA: Diagnosis not present

## 2022-05-26 DIAGNOSIS — D509 Iron deficiency anemia, unspecified: Secondary | ICD-10-CM | POA: Diagnosis not present

## 2022-05-26 DIAGNOSIS — Z992 Dependence on renal dialysis: Secondary | ICD-10-CM | POA: Diagnosis not present

## 2022-05-26 DIAGNOSIS — N2581 Secondary hyperparathyroidism of renal origin: Secondary | ICD-10-CM | POA: Diagnosis not present

## 2022-05-29 DIAGNOSIS — N186 End stage renal disease: Secondary | ICD-10-CM | POA: Diagnosis not present

## 2022-05-29 DIAGNOSIS — N2581 Secondary hyperparathyroidism of renal origin: Secondary | ICD-10-CM | POA: Diagnosis not present

## 2022-05-29 DIAGNOSIS — Z992 Dependence on renal dialysis: Secondary | ICD-10-CM | POA: Diagnosis not present

## 2022-05-29 DIAGNOSIS — D509 Iron deficiency anemia, unspecified: Secondary | ICD-10-CM | POA: Diagnosis not present

## 2022-05-29 DIAGNOSIS — R519 Headache, unspecified: Secondary | ICD-10-CM | POA: Diagnosis not present

## 2022-05-29 DIAGNOSIS — D631 Anemia in chronic kidney disease: Secondary | ICD-10-CM | POA: Diagnosis not present

## 2022-05-29 DIAGNOSIS — D689 Coagulation defect, unspecified: Secondary | ICD-10-CM | POA: Diagnosis not present

## 2022-05-31 DIAGNOSIS — Z992 Dependence on renal dialysis: Secondary | ICD-10-CM | POA: Diagnosis not present

## 2022-05-31 DIAGNOSIS — N2581 Secondary hyperparathyroidism of renal origin: Secondary | ICD-10-CM | POA: Diagnosis not present

## 2022-05-31 DIAGNOSIS — D509 Iron deficiency anemia, unspecified: Secondary | ICD-10-CM | POA: Diagnosis not present

## 2022-05-31 DIAGNOSIS — D631 Anemia in chronic kidney disease: Secondary | ICD-10-CM | POA: Diagnosis not present

## 2022-05-31 DIAGNOSIS — N186 End stage renal disease: Secondary | ICD-10-CM | POA: Diagnosis not present

## 2022-05-31 DIAGNOSIS — R519 Headache, unspecified: Secondary | ICD-10-CM | POA: Diagnosis not present

## 2022-05-31 DIAGNOSIS — D689 Coagulation defect, unspecified: Secondary | ICD-10-CM | POA: Diagnosis not present

## 2022-06-02 DIAGNOSIS — N186 End stage renal disease: Secondary | ICD-10-CM | POA: Diagnosis not present

## 2022-06-02 DIAGNOSIS — N040 Nephrotic syndrome with minor glomerular abnormality: Secondary | ICD-10-CM | POA: Diagnosis not present

## 2022-06-02 DIAGNOSIS — Z992 Dependence on renal dialysis: Secondary | ICD-10-CM | POA: Diagnosis not present

## 2022-06-05 DIAGNOSIS — D509 Iron deficiency anemia, unspecified: Secondary | ICD-10-CM | POA: Diagnosis not present

## 2022-06-05 DIAGNOSIS — D689 Coagulation defect, unspecified: Secondary | ICD-10-CM | POA: Diagnosis not present

## 2022-06-05 DIAGNOSIS — N186 End stage renal disease: Secondary | ICD-10-CM | POA: Diagnosis not present

## 2022-06-05 DIAGNOSIS — N2581 Secondary hyperparathyroidism of renal origin: Secondary | ICD-10-CM | POA: Diagnosis not present

## 2022-06-05 DIAGNOSIS — Z992 Dependence on renal dialysis: Secondary | ICD-10-CM | POA: Diagnosis not present

## 2022-06-05 DIAGNOSIS — D631 Anemia in chronic kidney disease: Secondary | ICD-10-CM | POA: Diagnosis not present

## 2022-06-06 ENCOUNTER — Encounter: Payer: Self-pay | Admitting: Nurse Practitioner

## 2022-06-07 DIAGNOSIS — N186 End stage renal disease: Secondary | ICD-10-CM | POA: Diagnosis not present

## 2022-06-07 DIAGNOSIS — D509 Iron deficiency anemia, unspecified: Secondary | ICD-10-CM | POA: Diagnosis not present

## 2022-06-07 DIAGNOSIS — N2581 Secondary hyperparathyroidism of renal origin: Secondary | ICD-10-CM | POA: Diagnosis not present

## 2022-06-07 DIAGNOSIS — Z992 Dependence on renal dialysis: Secondary | ICD-10-CM | POA: Diagnosis not present

## 2022-06-07 DIAGNOSIS — D631 Anemia in chronic kidney disease: Secondary | ICD-10-CM | POA: Diagnosis not present

## 2022-06-07 DIAGNOSIS — D689 Coagulation defect, unspecified: Secondary | ICD-10-CM | POA: Diagnosis not present

## 2022-06-07 NOTE — Telephone Encounter (Signed)
Error

## 2022-06-09 ENCOUNTER — Other Ambulatory Visit: Payer: Self-pay | Admitting: Physician Assistant

## 2022-06-09 DIAGNOSIS — N186 End stage renal disease: Secondary | ICD-10-CM | POA: Diagnosis not present

## 2022-06-09 DIAGNOSIS — I10 Essential (primary) hypertension: Secondary | ICD-10-CM

## 2022-06-09 DIAGNOSIS — D509 Iron deficiency anemia, unspecified: Secondary | ICD-10-CM | POA: Diagnosis not present

## 2022-06-09 DIAGNOSIS — D631 Anemia in chronic kidney disease: Secondary | ICD-10-CM | POA: Diagnosis not present

## 2022-06-09 DIAGNOSIS — N2581 Secondary hyperparathyroidism of renal origin: Secondary | ICD-10-CM | POA: Diagnosis not present

## 2022-06-09 DIAGNOSIS — Z992 Dependence on renal dialysis: Secondary | ICD-10-CM | POA: Diagnosis not present

## 2022-06-09 DIAGNOSIS — D689 Coagulation defect, unspecified: Secondary | ICD-10-CM | POA: Diagnosis not present

## 2022-06-12 DIAGNOSIS — N2581 Secondary hyperparathyroidism of renal origin: Secondary | ICD-10-CM | POA: Diagnosis not present

## 2022-06-12 DIAGNOSIS — Z992 Dependence on renal dialysis: Secondary | ICD-10-CM | POA: Diagnosis not present

## 2022-06-12 DIAGNOSIS — N186 End stage renal disease: Secondary | ICD-10-CM | POA: Diagnosis not present

## 2022-06-12 DIAGNOSIS — D509 Iron deficiency anemia, unspecified: Secondary | ICD-10-CM | POA: Diagnosis not present

## 2022-06-12 DIAGNOSIS — D689 Coagulation defect, unspecified: Secondary | ICD-10-CM | POA: Diagnosis not present

## 2022-06-12 DIAGNOSIS — D631 Anemia in chronic kidney disease: Secondary | ICD-10-CM | POA: Diagnosis not present

## 2022-06-14 DIAGNOSIS — Z992 Dependence on renal dialysis: Secondary | ICD-10-CM | POA: Diagnosis not present

## 2022-06-14 DIAGNOSIS — D689 Coagulation defect, unspecified: Secondary | ICD-10-CM | POA: Diagnosis not present

## 2022-06-14 DIAGNOSIS — D631 Anemia in chronic kidney disease: Secondary | ICD-10-CM | POA: Diagnosis not present

## 2022-06-14 DIAGNOSIS — D509 Iron deficiency anemia, unspecified: Secondary | ICD-10-CM | POA: Diagnosis not present

## 2022-06-14 DIAGNOSIS — N186 End stage renal disease: Secondary | ICD-10-CM | POA: Diagnosis not present

## 2022-06-14 DIAGNOSIS — N2581 Secondary hyperparathyroidism of renal origin: Secondary | ICD-10-CM | POA: Diagnosis not present

## 2022-06-16 DIAGNOSIS — Z992 Dependence on renal dialysis: Secondary | ICD-10-CM | POA: Diagnosis not present

## 2022-06-16 DIAGNOSIS — D509 Iron deficiency anemia, unspecified: Secondary | ICD-10-CM | POA: Diagnosis not present

## 2022-06-16 DIAGNOSIS — N2581 Secondary hyperparathyroidism of renal origin: Secondary | ICD-10-CM | POA: Diagnosis not present

## 2022-06-16 DIAGNOSIS — D689 Coagulation defect, unspecified: Secondary | ICD-10-CM | POA: Diagnosis not present

## 2022-06-16 DIAGNOSIS — N186 End stage renal disease: Secondary | ICD-10-CM | POA: Diagnosis not present

## 2022-06-16 DIAGNOSIS — D631 Anemia in chronic kidney disease: Secondary | ICD-10-CM | POA: Diagnosis not present

## 2022-06-19 DIAGNOSIS — D689 Coagulation defect, unspecified: Secondary | ICD-10-CM | POA: Diagnosis not present

## 2022-06-19 DIAGNOSIS — D631 Anemia in chronic kidney disease: Secondary | ICD-10-CM | POA: Diagnosis not present

## 2022-06-19 DIAGNOSIS — N186 End stage renal disease: Secondary | ICD-10-CM | POA: Diagnosis not present

## 2022-06-19 DIAGNOSIS — D509 Iron deficiency anemia, unspecified: Secondary | ICD-10-CM | POA: Diagnosis not present

## 2022-06-19 DIAGNOSIS — Z992 Dependence on renal dialysis: Secondary | ICD-10-CM | POA: Diagnosis not present

## 2022-06-19 DIAGNOSIS — N2581 Secondary hyperparathyroidism of renal origin: Secondary | ICD-10-CM | POA: Diagnosis not present

## 2022-06-21 DIAGNOSIS — N2581 Secondary hyperparathyroidism of renal origin: Secondary | ICD-10-CM | POA: Diagnosis not present

## 2022-06-21 DIAGNOSIS — N186 End stage renal disease: Secondary | ICD-10-CM | POA: Diagnosis not present

## 2022-06-21 DIAGNOSIS — D689 Coagulation defect, unspecified: Secondary | ICD-10-CM | POA: Diagnosis not present

## 2022-06-21 DIAGNOSIS — D631 Anemia in chronic kidney disease: Secondary | ICD-10-CM | POA: Diagnosis not present

## 2022-06-21 DIAGNOSIS — Z992 Dependence on renal dialysis: Secondary | ICD-10-CM | POA: Diagnosis not present

## 2022-06-21 DIAGNOSIS — D509 Iron deficiency anemia, unspecified: Secondary | ICD-10-CM | POA: Diagnosis not present

## 2022-06-23 DIAGNOSIS — D631 Anemia in chronic kidney disease: Secondary | ICD-10-CM | POA: Diagnosis not present

## 2022-06-23 DIAGNOSIS — N186 End stage renal disease: Secondary | ICD-10-CM | POA: Diagnosis not present

## 2022-06-23 DIAGNOSIS — D509 Iron deficiency anemia, unspecified: Secondary | ICD-10-CM | POA: Diagnosis not present

## 2022-06-23 DIAGNOSIS — N2581 Secondary hyperparathyroidism of renal origin: Secondary | ICD-10-CM | POA: Diagnosis not present

## 2022-06-23 DIAGNOSIS — Z992 Dependence on renal dialysis: Secondary | ICD-10-CM | POA: Diagnosis not present

## 2022-06-23 DIAGNOSIS — D689 Coagulation defect, unspecified: Secondary | ICD-10-CM | POA: Diagnosis not present

## 2022-06-26 DIAGNOSIS — N2581 Secondary hyperparathyroidism of renal origin: Secondary | ICD-10-CM | POA: Diagnosis not present

## 2022-06-26 DIAGNOSIS — N186 End stage renal disease: Secondary | ICD-10-CM | POA: Diagnosis not present

## 2022-06-26 DIAGNOSIS — Z992 Dependence on renal dialysis: Secondary | ICD-10-CM | POA: Diagnosis not present

## 2022-06-26 DIAGNOSIS — D509 Iron deficiency anemia, unspecified: Secondary | ICD-10-CM | POA: Diagnosis not present

## 2022-06-26 DIAGNOSIS — D689 Coagulation defect, unspecified: Secondary | ICD-10-CM | POA: Diagnosis not present

## 2022-06-26 DIAGNOSIS — D631 Anemia in chronic kidney disease: Secondary | ICD-10-CM | POA: Diagnosis not present

## 2022-06-28 DIAGNOSIS — Z992 Dependence on renal dialysis: Secondary | ICD-10-CM | POA: Diagnosis not present

## 2022-06-28 DIAGNOSIS — N186 End stage renal disease: Secondary | ICD-10-CM | POA: Diagnosis not present

## 2022-06-28 DIAGNOSIS — D509 Iron deficiency anemia, unspecified: Secondary | ICD-10-CM | POA: Diagnosis not present

## 2022-06-28 DIAGNOSIS — D631 Anemia in chronic kidney disease: Secondary | ICD-10-CM | POA: Diagnosis not present

## 2022-06-28 DIAGNOSIS — D689 Coagulation defect, unspecified: Secondary | ICD-10-CM | POA: Diagnosis not present

## 2022-06-28 DIAGNOSIS — N2581 Secondary hyperparathyroidism of renal origin: Secondary | ICD-10-CM | POA: Diagnosis not present

## 2022-06-30 DIAGNOSIS — D509 Iron deficiency anemia, unspecified: Secondary | ICD-10-CM | POA: Diagnosis not present

## 2022-06-30 DIAGNOSIS — N186 End stage renal disease: Secondary | ICD-10-CM | POA: Diagnosis not present

## 2022-06-30 DIAGNOSIS — D631 Anemia in chronic kidney disease: Secondary | ICD-10-CM | POA: Diagnosis not present

## 2022-06-30 DIAGNOSIS — Z992 Dependence on renal dialysis: Secondary | ICD-10-CM | POA: Diagnosis not present

## 2022-06-30 DIAGNOSIS — N2581 Secondary hyperparathyroidism of renal origin: Secondary | ICD-10-CM | POA: Diagnosis not present

## 2022-06-30 DIAGNOSIS — D689 Coagulation defect, unspecified: Secondary | ICD-10-CM | POA: Diagnosis not present

## 2022-07-03 ENCOUNTER — Other Ambulatory Visit: Payer: Self-pay | Admitting: *Deleted

## 2022-07-03 DIAGNOSIS — D631 Anemia in chronic kidney disease: Secondary | ICD-10-CM | POA: Diagnosis not present

## 2022-07-03 DIAGNOSIS — N186 End stage renal disease: Secondary | ICD-10-CM | POA: Diagnosis not present

## 2022-07-03 DIAGNOSIS — D689 Coagulation defect, unspecified: Secondary | ICD-10-CM | POA: Diagnosis not present

## 2022-07-03 DIAGNOSIS — Z992 Dependence on renal dialysis: Secondary | ICD-10-CM | POA: Diagnosis not present

## 2022-07-03 DIAGNOSIS — D509 Iron deficiency anemia, unspecified: Secondary | ICD-10-CM | POA: Diagnosis not present

## 2022-07-03 DIAGNOSIS — N040 Nephrotic syndrome with minor glomerular abnormality: Secondary | ICD-10-CM | POA: Diagnosis not present

## 2022-07-03 DIAGNOSIS — N2581 Secondary hyperparathyroidism of renal origin: Secondary | ICD-10-CM | POA: Diagnosis not present

## 2022-07-03 NOTE — Patient Outreach (Signed)
  Care Coordination   Initial Visit Note   07/03/2022 Name: Kristen Francis MRN: [id number] DOB: 1946-11-15  Kristen Francis is a 76 y.o. year old female who sees Jonetta Osgood, NP for primary care.  Unsuccessful outreach call to patient to inform her about the [facility] program.    Follow up plan: No further intervention required.   Encounter Outcome:  No Answer  Emelia Loron RN, BSN [place] [phone] [email or web address]

## 2022-07-05 DIAGNOSIS — N186 End stage renal disease: Secondary | ICD-10-CM | POA: Diagnosis not present

## 2022-07-05 DIAGNOSIS — D689 Coagulation defect, unspecified: Secondary | ICD-10-CM | POA: Diagnosis not present

## 2022-07-05 DIAGNOSIS — N2581 Secondary hyperparathyroidism of renal origin: Secondary | ICD-10-CM | POA: Diagnosis not present

## 2022-07-05 DIAGNOSIS — D509 Iron deficiency anemia, unspecified: Secondary | ICD-10-CM | POA: Diagnosis not present

## 2022-07-05 DIAGNOSIS — Z992 Dependence on renal dialysis: Secondary | ICD-10-CM | POA: Diagnosis not present

## 2022-07-05 DIAGNOSIS — D631 Anemia in chronic kidney disease: Secondary | ICD-10-CM | POA: Diagnosis not present

## 2022-07-07 DIAGNOSIS — N186 End stage renal disease: Secondary | ICD-10-CM | POA: Diagnosis not present

## 2022-07-07 DIAGNOSIS — D509 Iron deficiency anemia, unspecified: Secondary | ICD-10-CM | POA: Diagnosis not present

## 2022-07-07 DIAGNOSIS — N2581 Secondary hyperparathyroidism of renal origin: Secondary | ICD-10-CM | POA: Diagnosis not present

## 2022-07-07 DIAGNOSIS — Z992 Dependence on renal dialysis: Secondary | ICD-10-CM | POA: Diagnosis not present

## 2022-07-07 DIAGNOSIS — D631 Anemia in chronic kidney disease: Secondary | ICD-10-CM | POA: Diagnosis not present

## 2022-07-07 DIAGNOSIS — D689 Coagulation defect, unspecified: Secondary | ICD-10-CM | POA: Diagnosis not present

## 2022-07-10 DIAGNOSIS — N2581 Secondary hyperparathyroidism of renal origin: Secondary | ICD-10-CM | POA: Diagnosis not present

## 2022-07-10 DIAGNOSIS — N186 End stage renal disease: Secondary | ICD-10-CM | POA: Diagnosis not present

## 2022-07-10 DIAGNOSIS — D631 Anemia in chronic kidney disease: Secondary | ICD-10-CM | POA: Diagnosis not present

## 2022-07-10 DIAGNOSIS — Z992 Dependence on renal dialysis: Secondary | ICD-10-CM | POA: Diagnosis not present

## 2022-07-10 DIAGNOSIS — D509 Iron deficiency anemia, unspecified: Secondary | ICD-10-CM | POA: Diagnosis not present

## 2022-07-10 DIAGNOSIS — D689 Coagulation defect, unspecified: Secondary | ICD-10-CM | POA: Diagnosis not present

## 2022-07-12 DIAGNOSIS — D631 Anemia in chronic kidney disease: Secondary | ICD-10-CM | POA: Diagnosis not present

## 2022-07-12 DIAGNOSIS — N186 End stage renal disease: Secondary | ICD-10-CM | POA: Diagnosis not present

## 2022-07-12 DIAGNOSIS — D509 Iron deficiency anemia, unspecified: Secondary | ICD-10-CM | POA: Diagnosis not present

## 2022-07-12 DIAGNOSIS — Z992 Dependence on renal dialysis: Secondary | ICD-10-CM | POA: Diagnosis not present

## 2022-07-12 DIAGNOSIS — N2581 Secondary hyperparathyroidism of renal origin: Secondary | ICD-10-CM | POA: Diagnosis not present

## 2022-07-12 DIAGNOSIS — D689 Coagulation defect, unspecified: Secondary | ICD-10-CM | POA: Diagnosis not present

## 2022-07-14 DIAGNOSIS — N186 End stage renal disease: Secondary | ICD-10-CM | POA: Diagnosis not present

## 2022-07-14 DIAGNOSIS — D509 Iron deficiency anemia, unspecified: Secondary | ICD-10-CM | POA: Diagnosis not present

## 2022-07-14 DIAGNOSIS — D631 Anemia in chronic kidney disease: Secondary | ICD-10-CM | POA: Diagnosis not present

## 2022-07-14 DIAGNOSIS — Z992 Dependence on renal dialysis: Secondary | ICD-10-CM | POA: Diagnosis not present

## 2022-07-14 DIAGNOSIS — D689 Coagulation defect, unspecified: Secondary | ICD-10-CM | POA: Diagnosis not present

## 2022-07-14 DIAGNOSIS — N2581 Secondary hyperparathyroidism of renal origin: Secondary | ICD-10-CM | POA: Diagnosis not present

## 2022-07-17 DIAGNOSIS — D509 Iron deficiency anemia, unspecified: Secondary | ICD-10-CM | POA: Diagnosis not present

## 2022-07-17 DIAGNOSIS — N2581 Secondary hyperparathyroidism of renal origin: Secondary | ICD-10-CM | POA: Diagnosis not present

## 2022-07-17 DIAGNOSIS — Z992 Dependence on renal dialysis: Secondary | ICD-10-CM | POA: Diagnosis not present

## 2022-07-17 DIAGNOSIS — D631 Anemia in chronic kidney disease: Secondary | ICD-10-CM | POA: Diagnosis not present

## 2022-07-17 DIAGNOSIS — D689 Coagulation defect, unspecified: Secondary | ICD-10-CM | POA: Diagnosis not present

## 2022-07-17 DIAGNOSIS — N186 End stage renal disease: Secondary | ICD-10-CM | POA: Diagnosis not present

## 2022-07-19 DIAGNOSIS — N186 End stage renal disease: Secondary | ICD-10-CM | POA: Diagnosis not present

## 2022-07-19 DIAGNOSIS — N2581 Secondary hyperparathyroidism of renal origin: Secondary | ICD-10-CM | POA: Diagnosis not present

## 2022-07-19 DIAGNOSIS — Z992 Dependence on renal dialysis: Secondary | ICD-10-CM | POA: Diagnosis not present

## 2022-07-19 DIAGNOSIS — D631 Anemia in chronic kidney disease: Secondary | ICD-10-CM | POA: Diagnosis not present

## 2022-07-19 DIAGNOSIS — D689 Coagulation defect, unspecified: Secondary | ICD-10-CM | POA: Diagnosis not present

## 2022-07-19 DIAGNOSIS — D509 Iron deficiency anemia, unspecified: Secondary | ICD-10-CM | POA: Diagnosis not present

## 2022-07-21 DIAGNOSIS — Z992 Dependence on renal dialysis: Secondary | ICD-10-CM | POA: Diagnosis not present

## 2022-07-21 DIAGNOSIS — N2581 Secondary hyperparathyroidism of renal origin: Secondary | ICD-10-CM | POA: Diagnosis not present

## 2022-07-21 DIAGNOSIS — D509 Iron deficiency anemia, unspecified: Secondary | ICD-10-CM | POA: Diagnosis not present

## 2022-07-21 DIAGNOSIS — N186 End stage renal disease: Secondary | ICD-10-CM | POA: Diagnosis not present

## 2022-07-21 DIAGNOSIS — D689 Coagulation defect, unspecified: Secondary | ICD-10-CM | POA: Diagnosis not present

## 2022-07-21 DIAGNOSIS — D631 Anemia in chronic kidney disease: Secondary | ICD-10-CM | POA: Diagnosis not present

## 2022-07-24 DIAGNOSIS — D509 Iron deficiency anemia, unspecified: Secondary | ICD-10-CM | POA: Diagnosis not present

## 2022-07-24 DIAGNOSIS — N2581 Secondary hyperparathyroidism of renal origin: Secondary | ICD-10-CM | POA: Diagnosis not present

## 2022-07-24 DIAGNOSIS — Z992 Dependence on renal dialysis: Secondary | ICD-10-CM | POA: Diagnosis not present

## 2022-07-24 DIAGNOSIS — D689 Coagulation defect, unspecified: Secondary | ICD-10-CM | POA: Diagnosis not present

## 2022-07-24 DIAGNOSIS — N186 End stage renal disease: Secondary | ICD-10-CM | POA: Diagnosis not present

## 2022-07-24 DIAGNOSIS — D631 Anemia in chronic kidney disease: Secondary | ICD-10-CM | POA: Diagnosis not present

## 2022-07-26 DIAGNOSIS — Z992 Dependence on renal dialysis: Secondary | ICD-10-CM | POA: Diagnosis not present

## 2022-07-26 DIAGNOSIS — N2581 Secondary hyperparathyroidism of renal origin: Secondary | ICD-10-CM | POA: Diagnosis not present

## 2022-07-26 DIAGNOSIS — D631 Anemia in chronic kidney disease: Secondary | ICD-10-CM | POA: Diagnosis not present

## 2022-07-26 DIAGNOSIS — D689 Coagulation defect, unspecified: Secondary | ICD-10-CM | POA: Diagnosis not present

## 2022-07-26 DIAGNOSIS — D509 Iron deficiency anemia, unspecified: Secondary | ICD-10-CM | POA: Diagnosis not present

## 2022-07-26 DIAGNOSIS — N186 End stage renal disease: Secondary | ICD-10-CM | POA: Diagnosis not present

## 2022-07-27 ENCOUNTER — Other Ambulatory Visit (INDEPENDENT_AMBULATORY_CARE_PROVIDER_SITE_OTHER): Payer: Self-pay | Admitting: Nurse Practitioner

## 2022-07-27 DIAGNOSIS — N186 End stage renal disease: Secondary | ICD-10-CM

## 2022-07-28 DIAGNOSIS — Z992 Dependence on renal dialysis: Secondary | ICD-10-CM | POA: Diagnosis not present

## 2022-07-28 DIAGNOSIS — N2581 Secondary hyperparathyroidism of renal origin: Secondary | ICD-10-CM | POA: Diagnosis not present

## 2022-07-28 DIAGNOSIS — D689 Coagulation defect, unspecified: Secondary | ICD-10-CM | POA: Diagnosis not present

## 2022-07-28 DIAGNOSIS — N186 End stage renal disease: Secondary | ICD-10-CM | POA: Diagnosis not present

## 2022-07-28 DIAGNOSIS — D509 Iron deficiency anemia, unspecified: Secondary | ICD-10-CM | POA: Diagnosis not present

## 2022-07-28 DIAGNOSIS — D631 Anemia in chronic kidney disease: Secondary | ICD-10-CM | POA: Diagnosis not present

## 2022-07-31 ENCOUNTER — Encounter (INDEPENDENT_AMBULATORY_CARE_PROVIDER_SITE_OTHER): Payer: Medicare Other

## 2022-07-31 ENCOUNTER — Ambulatory Visit (INDEPENDENT_AMBULATORY_CARE_PROVIDER_SITE_OTHER): Payer: Medicare Other | Admitting: Vascular Surgery

## 2022-07-31 DIAGNOSIS — D631 Anemia in chronic kidney disease: Secondary | ICD-10-CM | POA: Diagnosis not present

## 2022-07-31 DIAGNOSIS — D509 Iron deficiency anemia, unspecified: Secondary | ICD-10-CM | POA: Diagnosis not present

## 2022-07-31 DIAGNOSIS — N186 End stage renal disease: Secondary | ICD-10-CM | POA: Diagnosis not present

## 2022-07-31 DIAGNOSIS — N2581 Secondary hyperparathyroidism of renal origin: Secondary | ICD-10-CM | POA: Diagnosis not present

## 2022-07-31 DIAGNOSIS — Z992 Dependence on renal dialysis: Secondary | ICD-10-CM | POA: Diagnosis not present

## 2022-07-31 DIAGNOSIS — D689 Coagulation defect, unspecified: Secondary | ICD-10-CM | POA: Diagnosis not present

## 2022-08-02 DIAGNOSIS — N2581 Secondary hyperparathyroidism of renal origin: Secondary | ICD-10-CM | POA: Diagnosis not present

## 2022-08-02 DIAGNOSIS — D509 Iron deficiency anemia, unspecified: Secondary | ICD-10-CM | POA: Diagnosis not present

## 2022-08-02 DIAGNOSIS — D631 Anemia in chronic kidney disease: Secondary | ICD-10-CM | POA: Diagnosis not present

## 2022-08-02 DIAGNOSIS — Z992 Dependence on renal dialysis: Secondary | ICD-10-CM | POA: Diagnosis not present

## 2022-08-02 DIAGNOSIS — D689 Coagulation defect, unspecified: Secondary | ICD-10-CM | POA: Diagnosis not present

## 2022-08-02 DIAGNOSIS — N186 End stage renal disease: Secondary | ICD-10-CM | POA: Diagnosis not present

## 2022-08-03 DIAGNOSIS — Z992 Dependence on renal dialysis: Secondary | ICD-10-CM | POA: Diagnosis not present

## 2022-08-03 DIAGNOSIS — N186 End stage renal disease: Secondary | ICD-10-CM | POA: Diagnosis not present

## 2022-08-03 DIAGNOSIS — N040 Nephrotic syndrome with minor glomerular abnormality: Secondary | ICD-10-CM | POA: Diagnosis not present

## 2022-08-04 DIAGNOSIS — D509 Iron deficiency anemia, unspecified: Secondary | ICD-10-CM | POA: Diagnosis not present

## 2022-08-04 DIAGNOSIS — D631 Anemia in chronic kidney disease: Secondary | ICD-10-CM | POA: Diagnosis not present

## 2022-08-04 DIAGNOSIS — N2581 Secondary hyperparathyroidism of renal origin: Secondary | ICD-10-CM | POA: Diagnosis not present

## 2022-08-04 DIAGNOSIS — Z992 Dependence on renal dialysis: Secondary | ICD-10-CM | POA: Diagnosis not present

## 2022-08-04 DIAGNOSIS — D689 Coagulation defect, unspecified: Secondary | ICD-10-CM | POA: Diagnosis not present

## 2022-08-04 DIAGNOSIS — N186 End stage renal disease: Secondary | ICD-10-CM | POA: Diagnosis not present

## 2022-08-07 DIAGNOSIS — N186 End stage renal disease: Secondary | ICD-10-CM | POA: Diagnosis not present

## 2022-08-07 DIAGNOSIS — N2581 Secondary hyperparathyroidism of renal origin: Secondary | ICD-10-CM | POA: Diagnosis not present

## 2022-08-07 DIAGNOSIS — D509 Iron deficiency anemia, unspecified: Secondary | ICD-10-CM | POA: Diagnosis not present

## 2022-08-07 DIAGNOSIS — Z992 Dependence on renal dialysis: Secondary | ICD-10-CM | POA: Diagnosis not present

## 2022-08-07 DIAGNOSIS — D631 Anemia in chronic kidney disease: Secondary | ICD-10-CM | POA: Diagnosis not present

## 2022-08-07 DIAGNOSIS — D689 Coagulation defect, unspecified: Secondary | ICD-10-CM | POA: Diagnosis not present

## 2022-08-08 ENCOUNTER — Telehealth: Payer: Self-pay

## 2022-08-08 NOTE — Telephone Encounter (Signed)
Lmom regarding losartan last time its done 7/22 please clarify if she still taking and who did refills

## 2022-08-09 ENCOUNTER — Other Ambulatory Visit: Payer: Self-pay | Admitting: Nurse Practitioner

## 2022-08-09 DIAGNOSIS — Z992 Dependence on renal dialysis: Secondary | ICD-10-CM | POA: Diagnosis not present

## 2022-08-09 DIAGNOSIS — N186 End stage renal disease: Secondary | ICD-10-CM | POA: Diagnosis not present

## 2022-08-09 DIAGNOSIS — D509 Iron deficiency anemia, unspecified: Secondary | ICD-10-CM | POA: Diagnosis not present

## 2022-08-09 DIAGNOSIS — D689 Coagulation defect, unspecified: Secondary | ICD-10-CM | POA: Diagnosis not present

## 2022-08-09 DIAGNOSIS — D631 Anemia in chronic kidney disease: Secondary | ICD-10-CM | POA: Diagnosis not present

## 2022-08-09 DIAGNOSIS — N2581 Secondary hyperparathyroidism of renal origin: Secondary | ICD-10-CM | POA: Diagnosis not present

## 2022-08-09 MED ORDER — LOSARTAN POTASSIUM 50 MG PO TABS: 50.0000 mg | ORAL_TABLET | Freq: Every day | ORAL | 3 refills | Status: DC

## 2022-08-09 NOTE — Telephone Encounter (Signed)
Pt advised we send losartan to her phar

## 2022-08-11 DIAGNOSIS — N2581 Secondary hyperparathyroidism of renal origin: Secondary | ICD-10-CM | POA: Diagnosis not present

## 2022-08-11 DIAGNOSIS — Z992 Dependence on renal dialysis: Secondary | ICD-10-CM | POA: Diagnosis not present

## 2022-08-11 DIAGNOSIS — D631 Anemia in chronic kidney disease: Secondary | ICD-10-CM | POA: Diagnosis not present

## 2022-08-11 DIAGNOSIS — D689 Coagulation defect, unspecified: Secondary | ICD-10-CM | POA: Diagnosis not present

## 2022-08-11 DIAGNOSIS — N186 End stage renal disease: Secondary | ICD-10-CM | POA: Diagnosis not present

## 2022-08-11 DIAGNOSIS — D509 Iron deficiency anemia, unspecified: Secondary | ICD-10-CM | POA: Diagnosis not present

## 2022-08-14 DIAGNOSIS — N2581 Secondary hyperparathyroidism of renal origin: Secondary | ICD-10-CM | POA: Diagnosis not present

## 2022-08-14 DIAGNOSIS — D689 Coagulation defect, unspecified: Secondary | ICD-10-CM | POA: Diagnosis not present

## 2022-08-14 DIAGNOSIS — D509 Iron deficiency anemia, unspecified: Secondary | ICD-10-CM | POA: Diagnosis not present

## 2022-08-14 DIAGNOSIS — D631 Anemia in chronic kidney disease: Secondary | ICD-10-CM | POA: Diagnosis not present

## 2022-08-14 DIAGNOSIS — Z992 Dependence on renal dialysis: Secondary | ICD-10-CM | POA: Diagnosis not present

## 2022-08-14 DIAGNOSIS — N186 End stage renal disease: Secondary | ICD-10-CM | POA: Diagnosis not present

## 2022-08-16 DIAGNOSIS — D509 Iron deficiency anemia, unspecified: Secondary | ICD-10-CM | POA: Diagnosis not present

## 2022-08-16 DIAGNOSIS — D631 Anemia in chronic kidney disease: Secondary | ICD-10-CM | POA: Diagnosis not present

## 2022-08-16 DIAGNOSIS — D689 Coagulation defect, unspecified: Secondary | ICD-10-CM | POA: Diagnosis not present

## 2022-08-16 DIAGNOSIS — N186 End stage renal disease: Secondary | ICD-10-CM | POA: Diagnosis not present

## 2022-08-16 DIAGNOSIS — Z992 Dependence on renal dialysis: Secondary | ICD-10-CM | POA: Diagnosis not present

## 2022-08-16 DIAGNOSIS — N2581 Secondary hyperparathyroidism of renal origin: Secondary | ICD-10-CM | POA: Diagnosis not present

## 2022-08-18 DIAGNOSIS — D689 Coagulation defect, unspecified: Secondary | ICD-10-CM | POA: Diagnosis not present

## 2022-08-18 DIAGNOSIS — N186 End stage renal disease: Secondary | ICD-10-CM | POA: Diagnosis not present

## 2022-08-18 DIAGNOSIS — Z992 Dependence on renal dialysis: Secondary | ICD-10-CM | POA: Diagnosis not present

## 2022-08-18 DIAGNOSIS — D631 Anemia in chronic kidney disease: Secondary | ICD-10-CM | POA: Diagnosis not present

## 2022-08-18 DIAGNOSIS — D509 Iron deficiency anemia, unspecified: Secondary | ICD-10-CM | POA: Diagnosis not present

## 2022-08-18 DIAGNOSIS — N2581 Secondary hyperparathyroidism of renal origin: Secondary | ICD-10-CM | POA: Diagnosis not present

## 2022-08-21 DIAGNOSIS — Z992 Dependence on renal dialysis: Secondary | ICD-10-CM | POA: Diagnosis not present

## 2022-08-21 DIAGNOSIS — D509 Iron deficiency anemia, unspecified: Secondary | ICD-10-CM | POA: Diagnosis not present

## 2022-08-21 DIAGNOSIS — D631 Anemia in chronic kidney disease: Secondary | ICD-10-CM | POA: Diagnosis not present

## 2022-08-21 DIAGNOSIS — D689 Coagulation defect, unspecified: Secondary | ICD-10-CM | POA: Diagnosis not present

## 2022-08-21 DIAGNOSIS — N186 End stage renal disease: Secondary | ICD-10-CM | POA: Diagnosis not present

## 2022-08-21 DIAGNOSIS — N2581 Secondary hyperparathyroidism of renal origin: Secondary | ICD-10-CM | POA: Diagnosis not present

## 2022-08-23 DIAGNOSIS — N186 End stage renal disease: Secondary | ICD-10-CM | POA: Diagnosis not present

## 2022-08-23 DIAGNOSIS — D689 Coagulation defect, unspecified: Secondary | ICD-10-CM | POA: Diagnosis not present

## 2022-08-23 DIAGNOSIS — D509 Iron deficiency anemia, unspecified: Secondary | ICD-10-CM | POA: Diagnosis not present

## 2022-08-23 DIAGNOSIS — Z992 Dependence on renal dialysis: Secondary | ICD-10-CM | POA: Diagnosis not present

## 2022-08-23 DIAGNOSIS — N2581 Secondary hyperparathyroidism of renal origin: Secondary | ICD-10-CM | POA: Diagnosis not present

## 2022-08-23 DIAGNOSIS — D631 Anemia in chronic kidney disease: Secondary | ICD-10-CM | POA: Diagnosis not present

## 2022-08-25 DIAGNOSIS — N2581 Secondary hyperparathyroidism of renal origin: Secondary | ICD-10-CM | POA: Diagnosis not present

## 2022-08-25 DIAGNOSIS — Z992 Dependence on renal dialysis: Secondary | ICD-10-CM | POA: Diagnosis not present

## 2022-08-25 DIAGNOSIS — D689 Coagulation defect, unspecified: Secondary | ICD-10-CM | POA: Diagnosis not present

## 2022-08-25 DIAGNOSIS — D631 Anemia in chronic kidney disease: Secondary | ICD-10-CM | POA: Diagnosis not present

## 2022-08-25 DIAGNOSIS — N186 End stage renal disease: Secondary | ICD-10-CM | POA: Diagnosis not present

## 2022-08-25 DIAGNOSIS — D509 Iron deficiency anemia, unspecified: Secondary | ICD-10-CM | POA: Diagnosis not present

## 2022-08-28 DIAGNOSIS — D689 Coagulation defect, unspecified: Secondary | ICD-10-CM | POA: Diagnosis not present

## 2022-08-28 DIAGNOSIS — Z992 Dependence on renal dialysis: Secondary | ICD-10-CM | POA: Diagnosis not present

## 2022-08-28 DIAGNOSIS — D509 Iron deficiency anemia, unspecified: Secondary | ICD-10-CM | POA: Diagnosis not present

## 2022-08-28 DIAGNOSIS — D631 Anemia in chronic kidney disease: Secondary | ICD-10-CM | POA: Diagnosis not present

## 2022-08-28 DIAGNOSIS — N2581 Secondary hyperparathyroidism of renal origin: Secondary | ICD-10-CM | POA: Diagnosis not present

## 2022-08-28 DIAGNOSIS — N186 End stage renal disease: Secondary | ICD-10-CM | POA: Diagnosis not present

## 2022-08-30 DIAGNOSIS — Z992 Dependence on renal dialysis: Secondary | ICD-10-CM | POA: Diagnosis not present

## 2022-08-30 DIAGNOSIS — N186 End stage renal disease: Secondary | ICD-10-CM | POA: Diagnosis not present

## 2022-08-30 DIAGNOSIS — N2581 Secondary hyperparathyroidism of renal origin: Secondary | ICD-10-CM | POA: Diagnosis not present

## 2022-08-30 DIAGNOSIS — D631 Anemia in chronic kidney disease: Secondary | ICD-10-CM | POA: Diagnosis not present

## 2022-08-30 DIAGNOSIS — D689 Coagulation defect, unspecified: Secondary | ICD-10-CM | POA: Diagnosis not present

## 2022-08-30 DIAGNOSIS — D509 Iron deficiency anemia, unspecified: Secondary | ICD-10-CM | POA: Diagnosis not present

## 2022-09-01 DIAGNOSIS — N186 End stage renal disease: Secondary | ICD-10-CM | POA: Diagnosis not present

## 2022-09-01 DIAGNOSIS — D509 Iron deficiency anemia, unspecified: Secondary | ICD-10-CM | POA: Diagnosis not present

## 2022-09-01 DIAGNOSIS — N2581 Secondary hyperparathyroidism of renal origin: Secondary | ICD-10-CM | POA: Diagnosis not present

## 2022-09-01 DIAGNOSIS — D631 Anemia in chronic kidney disease: Secondary | ICD-10-CM | POA: Diagnosis not present

## 2022-09-01 DIAGNOSIS — Z992 Dependence on renal dialysis: Secondary | ICD-10-CM | POA: Diagnosis not present

## 2022-09-01 DIAGNOSIS — D689 Coagulation defect, unspecified: Secondary | ICD-10-CM | POA: Diagnosis not present

## 2022-09-02 DIAGNOSIS — N040 Nephrotic syndrome with minor glomerular abnormality: Secondary | ICD-10-CM | POA: Diagnosis not present

## 2022-09-02 DIAGNOSIS — Z992 Dependence on renal dialysis: Secondary | ICD-10-CM | POA: Diagnosis not present

## 2022-09-02 DIAGNOSIS — N186 End stage renal disease: Secondary | ICD-10-CM | POA: Diagnosis not present

## 2022-09-04 DIAGNOSIS — Z992 Dependence on renal dialysis: Secondary | ICD-10-CM | POA: Diagnosis not present

## 2022-09-04 DIAGNOSIS — Z23 Encounter for immunization: Secondary | ICD-10-CM | POA: Diagnosis not present

## 2022-09-04 DIAGNOSIS — N186 End stage renal disease: Secondary | ICD-10-CM | POA: Diagnosis not present

## 2022-09-04 DIAGNOSIS — D689 Coagulation defect, unspecified: Secondary | ICD-10-CM | POA: Diagnosis not present

## 2022-09-04 DIAGNOSIS — D509 Iron deficiency anemia, unspecified: Secondary | ICD-10-CM | POA: Diagnosis not present

## 2022-09-04 DIAGNOSIS — N2581 Secondary hyperparathyroidism of renal origin: Secondary | ICD-10-CM | POA: Diagnosis not present

## 2022-09-06 ENCOUNTER — Ambulatory Visit (INDEPENDENT_AMBULATORY_CARE_PROVIDER_SITE_OTHER): Payer: Medicare Other | Admitting: Nurse Practitioner

## 2022-09-06 ENCOUNTER — Encounter: Payer: Self-pay | Admitting: Nurse Practitioner

## 2022-09-06 VITALS — BP 183/80 | HR 75 | Temp 97.6°F | Resp 16 | Ht 63.0 in | Wt 123.0 lb

## 2022-09-06 DIAGNOSIS — D509 Iron deficiency anemia, unspecified: Secondary | ICD-10-CM | POA: Diagnosis not present

## 2022-09-06 DIAGNOSIS — D689 Coagulation defect, unspecified: Secondary | ICD-10-CM | POA: Diagnosis not present

## 2022-09-06 DIAGNOSIS — Z23 Encounter for immunization: Secondary | ICD-10-CM | POA: Diagnosis not present

## 2022-09-06 DIAGNOSIS — Z992 Dependence on renal dialysis: Secondary | ICD-10-CM | POA: Diagnosis not present

## 2022-09-06 DIAGNOSIS — N186 End stage renal disease: Secondary | ICD-10-CM | POA: Diagnosis not present

## 2022-09-06 DIAGNOSIS — N2581 Secondary hyperparathyroidism of renal origin: Secondary | ICD-10-CM | POA: Diagnosis not present

## 2022-09-06 DIAGNOSIS — R110 Nausea: Secondary | ICD-10-CM | POA: Diagnosis not present

## 2022-09-06 DIAGNOSIS — R630 Anorexia: Secondary | ICD-10-CM | POA: Diagnosis not present

## 2022-09-06 MED ORDER — METOCLOPRAMIDE HCL 5 MG PO TABS: 5.0000 mg | ORAL_TABLET | Freq: Three times a day (TID) | ORAL | 1 refills | Status: DC

## 2022-09-06 NOTE — Progress Notes (Signed)
[facility] [place], [place] [address]  Internal MEDICINE  Office Visit Note  Patient Name: Kristen Francis  [id number]  [id number]  Date of Service: 09/06/2022  Chief Complaint  Patient presents with   Follow-up    Follow up loss of appetite.    Hyperlipidemia   Hypertension    HPI Kristen Francis presents for a follow up visit for decreased appetite, weakness and nausea.  --goes to dialysis 3 times per weak --these are possible symptoms one can experience during and after being dialyzed.  --reports that omeprazole helps with nausea which improved the decreased appetite some  --this has been ongoing for several months   Current Medication: Outpatient Encounter Medications as of 09/06/2022  Medication Sig   amLODipine (NORVASC) 10 MG tablet TAKE 1 TABLET(10 MG) BY MOUTH DAILY   aspirin EC 81 MG EC tablet Take 1 tablet (81 mg total) by mouth daily. Swallow whole.   B Complex-C-Folic Acid (RENAL-VITE) 0.8 MG TABS Take 0.8 mg by mouth every evening. On dialysis days   carvedilol (COREG) 25 MG tablet Take 1 tablet (25 mg total) by mouth 2 (two) times daily with a meal.   cholecalciferol (VITAMIN D) 1000 units tablet Take 1,000 Units by mouth daily.    cinacalcet (SENSIPAR) 60 MG tablet Take 60 mg by mouth every evening.   ferrous sulfate 325 (65 FE) MG tablet Take 1 tablet (325 mg total) by mouth 2 (two) times daily with a meal.   hydrALAZINE (APRESOLINE) 10 MG tablet TAKE 1 TABLET(10 MG) BY MOUTH TWICE DAILY   lidocaine-prilocaine (EMLA) cream APPLY SMALL AMOUNT TO ACCESS SITE (AVF) 3 TIMES A WEEK 1 HOUR BEFORE DIALYSIS. COVER WITH OCCLUSIVE DRESSING (SARAN WRAP)   losartan (COZAAR) 50 MG tablet Take 1 tablet (50 mg total) by mouth daily.   metoCLOPramide (REGLAN) 5 MG tablet Take 1 tablet (5 mg total) by mouth 4 (four) times daily -  before meals and at bedtime.   omeprazole (PRILOSEC) 40 MG capsule Take 1 capsule (40 mg total) by mouth daily.   rosuvastatin  (CRESTOR) 5 MG tablet TAKE 1 TABLET BY MOUTH DAILY. TAKE ON DAYS NOT HAVING DIALYSIS   sennosides-docusate sodium (SENOKOT-S) 8.6-50 MG tablet Take 2 tablets by mouth daily.   sevelamer carbonate (RENVELA) 800 MG tablet Take 1,600 mg by mouth 3 (three) times daily.   VELPHORO 500 MG chewable tablet Chew 500 mg by mouth 3 (three) times daily.   [DISCONTINUED] mirtazapine (REMERON) 7.5 MG tablet Take 1 tablet (7.5 mg total) by mouth at bedtime. (Patient not taking: No sig reported)   [DISCONTINUED] promethazine (PHENERGAN) 12.5 MG tablet Take 1 tablet (12.5 mg total) by mouth every 8 (eight) hours as needed for nausea or vomiting. (Patient not taking: No sig reported)   No facility-administered encounter medications on file as of 09/06/2022.    Surgical History: Past Surgical History:  Procedure Laterality Date   A/V FISTULAGRAM Left 12/25/2018   Procedure: A/V FISTULAGRAM;  Surgeon: Katha Cabal, MD;  Location: [place] [facility];  Service: Cardiovascular;  Laterality: Left;   ABDOMINAL HYSTERECTOMY     APPENDECTOMY     AV FISTULA PLACEMENT Left 07/12/2018   Procedure: INSERTION OF ARTERIOVENOUS (AV) GORE-TEX GRAFT ARM ( BRACHIAL AXILLARY );  Surgeon: Katha Cabal, MD;  Location: [facility] ORS;  Service: Vascular;  Laterality: Left;   COLONOSCOPY WITH PROPOFOL N/A 01/14/2016   Procedure: COLONOSCOPY WITH PROPOFOL;  Surgeon: Manya Silvas, MD;  Location: [place];  Service: Endoscopy;  Laterality: N/A;   COLONOSCOPY WITH PROPOFOL N/A 02/14/2022   Procedure: COLONOSCOPY WITH PROPOFOL;  Surgeon: Lesly Rubenstein, MD;  Location: [facility] ENDOSCOPY;  Service: Endoscopy;  Laterality: N/A;   TONSILLECTOMY      Medical History: Past Medical History:  Diagnosis Date   Anemia    Arthritis    Chronic kidney disease    ESRD   Complication of anesthesia    had procedure and felt incision early 80s   Gout    Hyperlipemia    Hypertension    Macular degeneration, right eye    Stroke  [facility])     Family History: Family History  Problem Relation Age of Onset   Kidney disease Mother    Breast cancer Neg Hx     Social History   Socioeconomic History   Marital status: Married    Spouse name: Not on file   Number of children: Not on file   Years of education: Not on file   Highest education level: Not on file  Occupational History   Not on file  Tobacco Use   Smoking status: Former   Smokeless tobacco: Never  Vaping Use   Vaping Use: Never used  Substance and Sexual Activity   Alcohol use: No   Drug use: No   Sexual activity: Not on file  Other Topics Concern   Not on file  Social History Narrative   Lives in  [place]; used to clean; no smoking; no alcohol.    Social Determinants of Health   Financial Resource Strain: Not on file  Food Insecurity: Not on file  Transportation Needs: Not on file  Physical Activity: Not on file  Stress: Not on file  Social Connections: Not on file  Intimate Partner Violence: Not on file      Review of Systems  Constitutional:  Positive for appetite change and fatigue. Negative for chills and unexpected weight change.  HENT:  Negative for congestion, postnasal drip, rhinorrhea, sneezing and sore throat.   Eyes:  Negative for redness.  Respiratory:  Negative for cough, chest tightness and shortness of breath.   Cardiovascular:  Negative for chest pain and palpitations.  Gastrointestinal:  Positive for abdominal pain and nausea. Negative for constipation, diarrhea and vomiting.  Genitourinary:  Negative for dysuria and frequency.  Musculoskeletal:  Negative for arthralgias, back pain, joint swelling and neck pain.  Skin:  Negative for rash.  Neurological: Negative.  Negative for tremors and numbness.  Hematological:  Negative for adenopathy. Does not bruise/bleed easily.  Psychiatric/Behavioral:  Negative for behavioral problems (Depression), sleep disturbance and suicidal ideas. The patient is not nervous/anxious.      Vital Signs: BP (!) 183/80   Pulse 75   Temp 97.6 F (36.4 C)   Resp 16   Ht '5\' 3"'$  (1.6 m)   Wt 123 lb (55.8 kg)   SpO2 99%   BMI 21.79 kg/m    Physical Exam Vitals and nursing note reviewed.  Constitutional:      General: She is not in acute distress.    Appearance: Normal appearance. She is well-developed and normal weight. She is not diaphoretic.  HENT:     Head: Normocephalic and atraumatic.     Mouth/Throat:     Pharynx: No oropharyngeal exudate.  Eyes:     Pupils: Pupils are equal, round, and reactive to light.  Neck:     Thyroid: No thyromegaly.     Vascular: No JVD.     Trachea:  No tracheal deviation.  Cardiovascular:     Rate and Rhythm: Normal rate and regular rhythm.     Heart sounds: Murmur heard.     No friction rub. No gallop.  Pulmonary:     Effort: Pulmonary effort is normal. No respiratory distress.     Breath sounds: No wheezing or rales.  Chest:     Chest wall: No tenderness.  Abdominal:     General: Bowel sounds are normal.     Palpations: Abdomen is soft.     Tenderness: There is no abdominal tenderness.  Musculoskeletal:        General: Normal range of motion.     Cervical back: Normal range of motion and neck supple.  Lymphadenopathy:     Cervical: No cervical adenopathy.  Skin:    General: Skin is warm and dry.  Neurological:     Mental Status: She is alert and oriented to person, place, and time.     Cranial Nerves: No cranial nerve deficit.  Psychiatric:        Behavior: Behavior normal.        Thought Content: Thought content normal.        Judgment: Judgment normal.        Assessment/Plan: 1. Nausea continue omeprazole as prescribed, try metoclopramide as prescribed. - metoCLOPramide (REGLAN) 5 MG tablet; Take 1 tablet (5 mg total) by mouth 4 (four) times daily -  before meals and at bedtime.  Dispense: 120 tablet; Refill: 1  2. Decreased appetite Keeping nausea under control will help to improve her appetite.  -  metoCLOPramide (REGLAN) 5 MG tablet; Take 1 tablet (5 mg total) by mouth 4 (four) times daily -  before meals and at bedtime.  Dispense: 120 tablet; Refill: 1   General Counseling: Oluwakemi verbalizes understanding of the findings of todays visit and agrees with plan of treatment. I have discussed any further diagnostic evaluation that may be needed or ordered today. We also reviewed her medications today. she has been encouraged to call the office with any questions or concerns that should arise related to todays visit.    No orders of the defined types were placed in this encounter.   Meds ordered this encounter  Medications   metoCLOPramide (REGLAN) 5 MG tablet    Sig: Take 1 tablet (5 mg total) by mouth 4 (four) times daily -  before meals and at bedtime.    Dispense:  120 tablet    Refill:  1    Return in about 2 weeks (around 09/20/2022) for f/u with DFK, eval new med and discuss decrease appetite and high BP.   Total time spent:30 Minutes Time spent includes review of chart, medications, test results, and follow up plan with the patient.   [place] Controlled Substance Database was reviewed by me.  This patient was seen by Jonetta Osgood, FNP-C in collaboration with Dr. Clayborn Bigness as a part of collaborative care agreement.   Lacharles Altschuler R. Valetta Fuller, MSN, FNP-C Internal medicine

## 2022-09-08 DIAGNOSIS — Z23 Encounter for immunization: Secondary | ICD-10-CM | POA: Diagnosis not present

## 2022-09-08 DIAGNOSIS — D509 Iron deficiency anemia, unspecified: Secondary | ICD-10-CM | POA: Diagnosis not present

## 2022-09-08 DIAGNOSIS — Z992 Dependence on renal dialysis: Secondary | ICD-10-CM | POA: Diagnosis not present

## 2022-09-08 DIAGNOSIS — N186 End stage renal disease: Secondary | ICD-10-CM | POA: Diagnosis not present

## 2022-09-08 DIAGNOSIS — N2581 Secondary hyperparathyroidism of renal origin: Secondary | ICD-10-CM | POA: Diagnosis not present

## 2022-09-08 DIAGNOSIS — D689 Coagulation defect, unspecified: Secondary | ICD-10-CM | POA: Diagnosis not present

## 2022-09-11 DIAGNOSIS — Z992 Dependence on renal dialysis: Secondary | ICD-10-CM | POA: Diagnosis not present

## 2022-09-11 DIAGNOSIS — N2581 Secondary hyperparathyroidism of renal origin: Secondary | ICD-10-CM | POA: Diagnosis not present

## 2022-09-11 DIAGNOSIS — Z23 Encounter for immunization: Secondary | ICD-10-CM | POA: Diagnosis not present

## 2022-09-11 DIAGNOSIS — D689 Coagulation defect, unspecified: Secondary | ICD-10-CM | POA: Diagnosis not present

## 2022-09-11 DIAGNOSIS — D509 Iron deficiency anemia, unspecified: Secondary | ICD-10-CM | POA: Diagnosis not present

## 2022-09-11 DIAGNOSIS — N186 End stage renal disease: Secondary | ICD-10-CM | POA: Diagnosis not present

## 2022-09-13 DIAGNOSIS — D689 Coagulation defect, unspecified: Secondary | ICD-10-CM | POA: Diagnosis not present

## 2022-09-13 DIAGNOSIS — D509 Iron deficiency anemia, unspecified: Secondary | ICD-10-CM | POA: Diagnosis not present

## 2022-09-13 DIAGNOSIS — Z992 Dependence on renal dialysis: Secondary | ICD-10-CM | POA: Diagnosis not present

## 2022-09-13 DIAGNOSIS — Z23 Encounter for immunization: Secondary | ICD-10-CM | POA: Diagnosis not present

## 2022-09-13 DIAGNOSIS — N186 End stage renal disease: Secondary | ICD-10-CM | POA: Diagnosis not present

## 2022-09-13 DIAGNOSIS — N2581 Secondary hyperparathyroidism of renal origin: Secondary | ICD-10-CM | POA: Diagnosis not present

## 2022-09-15 DIAGNOSIS — Z23 Encounter for immunization: Secondary | ICD-10-CM | POA: Diagnosis not present

## 2022-09-15 DIAGNOSIS — D689 Coagulation defect, unspecified: Secondary | ICD-10-CM | POA: Diagnosis not present

## 2022-09-15 DIAGNOSIS — N2581 Secondary hyperparathyroidism of renal origin: Secondary | ICD-10-CM | POA: Diagnosis not present

## 2022-09-15 DIAGNOSIS — D509 Iron deficiency anemia, unspecified: Secondary | ICD-10-CM | POA: Diagnosis not present

## 2022-09-15 DIAGNOSIS — Z992 Dependence on renal dialysis: Secondary | ICD-10-CM | POA: Diagnosis not present

## 2022-09-15 DIAGNOSIS — N186 End stage renal disease: Secondary | ICD-10-CM | POA: Diagnosis not present

## 2022-09-18 DIAGNOSIS — Z992 Dependence on renal dialysis: Secondary | ICD-10-CM | POA: Diagnosis not present

## 2022-09-18 DIAGNOSIS — N2581 Secondary hyperparathyroidism of renal origin: Secondary | ICD-10-CM | POA: Diagnosis not present

## 2022-09-18 DIAGNOSIS — D689 Coagulation defect, unspecified: Secondary | ICD-10-CM | POA: Diagnosis not present

## 2022-09-18 DIAGNOSIS — Z23 Encounter for immunization: Secondary | ICD-10-CM | POA: Diagnosis not present

## 2022-09-18 DIAGNOSIS — D509 Iron deficiency anemia, unspecified: Secondary | ICD-10-CM | POA: Diagnosis not present

## 2022-09-18 DIAGNOSIS — N186 End stage renal disease: Secondary | ICD-10-CM | POA: Diagnosis not present

## 2022-09-19 ENCOUNTER — Ambulatory Visit: Payer: Medicare Other | Admitting: Internal Medicine

## 2022-09-20 DIAGNOSIS — D509 Iron deficiency anemia, unspecified: Secondary | ICD-10-CM | POA: Diagnosis not present

## 2022-09-20 DIAGNOSIS — D689 Coagulation defect, unspecified: Secondary | ICD-10-CM | POA: Diagnosis not present

## 2022-09-20 DIAGNOSIS — N2581 Secondary hyperparathyroidism of renal origin: Secondary | ICD-10-CM | POA: Diagnosis not present

## 2022-09-20 DIAGNOSIS — N186 End stage renal disease: Secondary | ICD-10-CM | POA: Diagnosis not present

## 2022-09-20 DIAGNOSIS — Z992 Dependence on renal dialysis: Secondary | ICD-10-CM | POA: Diagnosis not present

## 2022-09-20 DIAGNOSIS — Z23 Encounter for immunization: Secondary | ICD-10-CM | POA: Diagnosis not present

## 2022-09-22 DIAGNOSIS — N186 End stage renal disease: Secondary | ICD-10-CM | POA: Diagnosis not present

## 2022-09-22 DIAGNOSIS — D509 Iron deficiency anemia, unspecified: Secondary | ICD-10-CM | POA: Diagnosis not present

## 2022-09-22 DIAGNOSIS — Z23 Encounter for immunization: Secondary | ICD-10-CM | POA: Diagnosis not present

## 2022-09-22 DIAGNOSIS — Z992 Dependence on renal dialysis: Secondary | ICD-10-CM | POA: Diagnosis not present

## 2022-09-22 DIAGNOSIS — N2581 Secondary hyperparathyroidism of renal origin: Secondary | ICD-10-CM | POA: Diagnosis not present

## 2022-09-22 DIAGNOSIS — D689 Coagulation defect, unspecified: Secondary | ICD-10-CM | POA: Diagnosis not present

## 2022-09-25 DIAGNOSIS — Z23 Encounter for immunization: Secondary | ICD-10-CM | POA: Diagnosis not present

## 2022-09-25 DIAGNOSIS — D509 Iron deficiency anemia, unspecified: Secondary | ICD-10-CM | POA: Diagnosis not present

## 2022-09-25 DIAGNOSIS — N186 End stage renal disease: Secondary | ICD-10-CM | POA: Diagnosis not present

## 2022-09-25 DIAGNOSIS — Z992 Dependence on renal dialysis: Secondary | ICD-10-CM | POA: Diagnosis not present

## 2022-09-25 DIAGNOSIS — N2581 Secondary hyperparathyroidism of renal origin: Secondary | ICD-10-CM | POA: Diagnosis not present

## 2022-09-25 DIAGNOSIS — D689 Coagulation defect, unspecified: Secondary | ICD-10-CM | POA: Diagnosis not present

## 2022-09-27 DIAGNOSIS — D689 Coagulation defect, unspecified: Secondary | ICD-10-CM | POA: Diagnosis not present

## 2022-09-27 DIAGNOSIS — Z992 Dependence on renal dialysis: Secondary | ICD-10-CM | POA: Diagnosis not present

## 2022-09-27 DIAGNOSIS — D509 Iron deficiency anemia, unspecified: Secondary | ICD-10-CM | POA: Diagnosis not present

## 2022-09-27 DIAGNOSIS — N2581 Secondary hyperparathyroidism of renal origin: Secondary | ICD-10-CM | POA: Diagnosis not present

## 2022-09-27 DIAGNOSIS — N186 End stage renal disease: Secondary | ICD-10-CM | POA: Diagnosis not present

## 2022-09-27 DIAGNOSIS — Z23 Encounter for immunization: Secondary | ICD-10-CM | POA: Diagnosis not present

## 2022-09-29 DIAGNOSIS — D509 Iron deficiency anemia, unspecified: Secondary | ICD-10-CM | POA: Diagnosis not present

## 2022-09-29 DIAGNOSIS — D689 Coagulation defect, unspecified: Secondary | ICD-10-CM | POA: Diagnosis not present

## 2022-09-29 DIAGNOSIS — N2581 Secondary hyperparathyroidism of renal origin: Secondary | ICD-10-CM | POA: Diagnosis not present

## 2022-09-29 DIAGNOSIS — Z992 Dependence on renal dialysis: Secondary | ICD-10-CM | POA: Diagnosis not present

## 2022-09-29 DIAGNOSIS — Z23 Encounter for immunization: Secondary | ICD-10-CM | POA: Diagnosis not present

## 2022-09-29 DIAGNOSIS — N186 End stage renal disease: Secondary | ICD-10-CM | POA: Diagnosis not present

## 2022-10-02 DIAGNOSIS — Z23 Encounter for immunization: Secondary | ICD-10-CM | POA: Diagnosis not present

## 2022-10-02 DIAGNOSIS — N186 End stage renal disease: Secondary | ICD-10-CM | POA: Diagnosis not present

## 2022-10-02 DIAGNOSIS — D689 Coagulation defect, unspecified: Secondary | ICD-10-CM | POA: Diagnosis not present

## 2022-10-02 DIAGNOSIS — Z992 Dependence on renal dialysis: Secondary | ICD-10-CM | POA: Diagnosis not present

## 2022-10-02 DIAGNOSIS — N2581 Secondary hyperparathyroidism of renal origin: Secondary | ICD-10-CM | POA: Diagnosis not present

## 2022-10-02 DIAGNOSIS — D509 Iron deficiency anemia, unspecified: Secondary | ICD-10-CM | POA: Diagnosis not present

## 2022-10-03 DIAGNOSIS — Z992 Dependence on renal dialysis: Secondary | ICD-10-CM | POA: Diagnosis not present

## 2022-10-03 DIAGNOSIS — N040 Nephrotic syndrome with minor glomerular abnormality: Secondary | ICD-10-CM | POA: Diagnosis not present

## 2022-10-03 DIAGNOSIS — N186 End stage renal disease: Secondary | ICD-10-CM | POA: Diagnosis not present

## 2022-10-04 DIAGNOSIS — D689 Coagulation defect, unspecified: Secondary | ICD-10-CM | POA: Diagnosis not present

## 2022-10-04 DIAGNOSIS — N2581 Secondary hyperparathyroidism of renal origin: Secondary | ICD-10-CM | POA: Diagnosis not present

## 2022-10-04 DIAGNOSIS — D509 Iron deficiency anemia, unspecified: Secondary | ICD-10-CM | POA: Diagnosis not present

## 2022-10-04 DIAGNOSIS — Z992 Dependence on renal dialysis: Secondary | ICD-10-CM | POA: Diagnosis not present

## 2022-10-04 DIAGNOSIS — D631 Anemia in chronic kidney disease: Secondary | ICD-10-CM | POA: Diagnosis not present

## 2022-10-04 DIAGNOSIS — N186 End stage renal disease: Secondary | ICD-10-CM | POA: Diagnosis not present

## 2022-10-06 DIAGNOSIS — N186 End stage renal disease: Secondary | ICD-10-CM | POA: Diagnosis not present

## 2022-10-06 DIAGNOSIS — Z992 Dependence on renal dialysis: Secondary | ICD-10-CM | POA: Diagnosis not present

## 2022-10-06 DIAGNOSIS — D689 Coagulation defect, unspecified: Secondary | ICD-10-CM | POA: Diagnosis not present

## 2022-10-06 DIAGNOSIS — N2581 Secondary hyperparathyroidism of renal origin: Secondary | ICD-10-CM | POA: Diagnosis not present

## 2022-10-06 DIAGNOSIS — D509 Iron deficiency anemia, unspecified: Secondary | ICD-10-CM | POA: Diagnosis not present

## 2022-10-06 DIAGNOSIS — D631 Anemia in chronic kidney disease: Secondary | ICD-10-CM | POA: Diagnosis not present

## 2022-10-08 ENCOUNTER — Other Ambulatory Visit: Payer: Self-pay | Admitting: Nurse Practitioner

## 2022-10-08 DIAGNOSIS — I10 Essential (primary) hypertension: Secondary | ICD-10-CM

## 2022-10-09 DIAGNOSIS — N186 End stage renal disease: Secondary | ICD-10-CM | POA: Diagnosis not present

## 2022-10-09 DIAGNOSIS — Z992 Dependence on renal dialysis: Secondary | ICD-10-CM | POA: Diagnosis not present

## 2022-10-09 DIAGNOSIS — D689 Coagulation defect, unspecified: Secondary | ICD-10-CM | POA: Diagnosis not present

## 2022-10-09 DIAGNOSIS — D631 Anemia in chronic kidney disease: Secondary | ICD-10-CM | POA: Diagnosis not present

## 2022-10-09 DIAGNOSIS — D509 Iron deficiency anemia, unspecified: Secondary | ICD-10-CM | POA: Diagnosis not present

## 2022-10-09 DIAGNOSIS — N2581 Secondary hyperparathyroidism of renal origin: Secondary | ICD-10-CM | POA: Diagnosis not present

## 2022-10-11 ENCOUNTER — Ambulatory Visit: Payer: Medicare Other | Admitting: Nurse Practitioner

## 2022-10-11 DIAGNOSIS — Z992 Dependence on renal dialysis: Secondary | ICD-10-CM | POA: Diagnosis not present

## 2022-10-11 DIAGNOSIS — D631 Anemia in chronic kidney disease: Secondary | ICD-10-CM | POA: Diagnosis not present

## 2022-10-11 DIAGNOSIS — N2581 Secondary hyperparathyroidism of renal origin: Secondary | ICD-10-CM | POA: Diagnosis not present

## 2022-10-11 DIAGNOSIS — D689 Coagulation defect, unspecified: Secondary | ICD-10-CM | POA: Diagnosis not present

## 2022-10-11 DIAGNOSIS — N186 End stage renal disease: Secondary | ICD-10-CM | POA: Diagnosis not present

## 2022-10-11 DIAGNOSIS — D509 Iron deficiency anemia, unspecified: Secondary | ICD-10-CM | POA: Diagnosis not present

## 2022-10-13 DIAGNOSIS — N186 End stage renal disease: Secondary | ICD-10-CM | POA: Diagnosis not present

## 2022-10-13 DIAGNOSIS — D689 Coagulation defect, unspecified: Secondary | ICD-10-CM | POA: Diagnosis not present

## 2022-10-13 DIAGNOSIS — Z992 Dependence on renal dialysis: Secondary | ICD-10-CM | POA: Diagnosis not present

## 2022-10-13 DIAGNOSIS — N2581 Secondary hyperparathyroidism of renal origin: Secondary | ICD-10-CM | POA: Diagnosis not present

## 2022-10-13 DIAGNOSIS — D509 Iron deficiency anemia, unspecified: Secondary | ICD-10-CM | POA: Diagnosis not present

## 2022-10-13 DIAGNOSIS — D631 Anemia in chronic kidney disease: Secondary | ICD-10-CM | POA: Diagnosis not present

## 2022-10-15 ENCOUNTER — Encounter: Payer: Self-pay | Admitting: Nurse Practitioner

## 2022-10-16 DIAGNOSIS — Z992 Dependence on renal dialysis: Secondary | ICD-10-CM | POA: Diagnosis not present

## 2022-10-16 DIAGNOSIS — D631 Anemia in chronic kidney disease: Secondary | ICD-10-CM | POA: Diagnosis not present

## 2022-10-16 DIAGNOSIS — D509 Iron deficiency anemia, unspecified: Secondary | ICD-10-CM | POA: Diagnosis not present

## 2022-10-16 DIAGNOSIS — D689 Coagulation defect, unspecified: Secondary | ICD-10-CM | POA: Diagnosis not present

## 2022-10-16 DIAGNOSIS — N186 End stage renal disease: Secondary | ICD-10-CM | POA: Diagnosis not present

## 2022-10-16 DIAGNOSIS — N2581 Secondary hyperparathyroidism of renal origin: Secondary | ICD-10-CM | POA: Diagnosis not present

## 2022-10-18 DIAGNOSIS — D631 Anemia in chronic kidney disease: Secondary | ICD-10-CM | POA: Diagnosis not present

## 2022-10-18 DIAGNOSIS — N2581 Secondary hyperparathyroidism of renal origin: Secondary | ICD-10-CM | POA: Diagnosis not present

## 2022-10-18 DIAGNOSIS — D509 Iron deficiency anemia, unspecified: Secondary | ICD-10-CM | POA: Diagnosis not present

## 2022-10-18 DIAGNOSIS — Z992 Dependence on renal dialysis: Secondary | ICD-10-CM | POA: Diagnosis not present

## 2022-10-18 DIAGNOSIS — N186 End stage renal disease: Secondary | ICD-10-CM | POA: Diagnosis not present

## 2022-10-18 DIAGNOSIS — D689 Coagulation defect, unspecified: Secondary | ICD-10-CM | POA: Diagnosis not present

## 2022-10-20 DIAGNOSIS — D509 Iron deficiency anemia, unspecified: Secondary | ICD-10-CM | POA: Diagnosis not present

## 2022-10-20 DIAGNOSIS — Z992 Dependence on renal dialysis: Secondary | ICD-10-CM | POA: Diagnosis not present

## 2022-10-20 DIAGNOSIS — D689 Coagulation defect, unspecified: Secondary | ICD-10-CM | POA: Diagnosis not present

## 2022-10-20 DIAGNOSIS — D631 Anemia in chronic kidney disease: Secondary | ICD-10-CM | POA: Diagnosis not present

## 2022-10-20 DIAGNOSIS — N186 End stage renal disease: Secondary | ICD-10-CM | POA: Diagnosis not present

## 2022-10-20 DIAGNOSIS — N2581 Secondary hyperparathyroidism of renal origin: Secondary | ICD-10-CM | POA: Diagnosis not present

## 2022-10-22 DIAGNOSIS — D689 Coagulation defect, unspecified: Secondary | ICD-10-CM | POA: Diagnosis not present

## 2022-10-22 DIAGNOSIS — N2581 Secondary hyperparathyroidism of renal origin: Secondary | ICD-10-CM | POA: Diagnosis not present

## 2022-10-22 DIAGNOSIS — N186 End stage renal disease: Secondary | ICD-10-CM | POA: Diagnosis not present

## 2022-10-22 DIAGNOSIS — D509 Iron deficiency anemia, unspecified: Secondary | ICD-10-CM | POA: Diagnosis not present

## 2022-10-22 DIAGNOSIS — Z992 Dependence on renal dialysis: Secondary | ICD-10-CM | POA: Diagnosis not present

## 2022-10-22 DIAGNOSIS — D631 Anemia in chronic kidney disease: Secondary | ICD-10-CM | POA: Diagnosis not present

## 2022-10-27 DIAGNOSIS — D509 Iron deficiency anemia, unspecified: Secondary | ICD-10-CM | POA: Diagnosis not present

## 2022-10-27 DIAGNOSIS — D631 Anemia in chronic kidney disease: Secondary | ICD-10-CM | POA: Diagnosis not present

## 2022-10-27 DIAGNOSIS — N186 End stage renal disease: Secondary | ICD-10-CM | POA: Diagnosis not present

## 2022-10-27 DIAGNOSIS — Z992 Dependence on renal dialysis: Secondary | ICD-10-CM | POA: Diagnosis not present

## 2022-10-27 DIAGNOSIS — N2581 Secondary hyperparathyroidism of renal origin: Secondary | ICD-10-CM | POA: Diagnosis not present

## 2022-10-27 DIAGNOSIS — D689 Coagulation defect, unspecified: Secondary | ICD-10-CM | POA: Diagnosis not present

## 2022-10-30 DIAGNOSIS — D509 Iron deficiency anemia, unspecified: Secondary | ICD-10-CM | POA: Diagnosis not present

## 2022-10-30 DIAGNOSIS — N186 End stage renal disease: Secondary | ICD-10-CM | POA: Diagnosis not present

## 2022-10-30 DIAGNOSIS — Z992 Dependence on renal dialysis: Secondary | ICD-10-CM | POA: Diagnosis not present

## 2022-10-30 DIAGNOSIS — N2581 Secondary hyperparathyroidism of renal origin: Secondary | ICD-10-CM | POA: Diagnosis not present

## 2022-10-30 DIAGNOSIS — D631 Anemia in chronic kidney disease: Secondary | ICD-10-CM | POA: Diagnosis not present

## 2022-10-30 DIAGNOSIS — D689 Coagulation defect, unspecified: Secondary | ICD-10-CM | POA: Diagnosis not present

## 2022-11-01 DIAGNOSIS — N2581 Secondary hyperparathyroidism of renal origin: Secondary | ICD-10-CM | POA: Diagnosis not present

## 2022-11-01 DIAGNOSIS — D631 Anemia in chronic kidney disease: Secondary | ICD-10-CM | POA: Diagnosis not present

## 2022-11-01 DIAGNOSIS — D509 Iron deficiency anemia, unspecified: Secondary | ICD-10-CM | POA: Diagnosis not present

## 2022-11-01 DIAGNOSIS — N186 End stage renal disease: Secondary | ICD-10-CM | POA: Diagnosis not present

## 2022-11-01 DIAGNOSIS — D689 Coagulation defect, unspecified: Secondary | ICD-10-CM | POA: Diagnosis not present

## 2022-11-01 DIAGNOSIS — Z992 Dependence on renal dialysis: Secondary | ICD-10-CM | POA: Diagnosis not present

## 2022-11-02 DIAGNOSIS — N040 Nephrotic syndrome with minor glomerular abnormality: Secondary | ICD-10-CM | POA: Diagnosis not present

## 2022-11-02 DIAGNOSIS — Z992 Dependence on renal dialysis: Secondary | ICD-10-CM | POA: Diagnosis not present

## 2022-11-02 DIAGNOSIS — N186 End stage renal disease: Secondary | ICD-10-CM | POA: Diagnosis not present

## 2022-11-03 DIAGNOSIS — N2581 Secondary hyperparathyroidism of renal origin: Secondary | ICD-10-CM | POA: Diagnosis not present

## 2022-11-03 DIAGNOSIS — N186 End stage renal disease: Secondary | ICD-10-CM | POA: Diagnosis not present

## 2022-11-03 DIAGNOSIS — D509 Iron deficiency anemia, unspecified: Secondary | ICD-10-CM | POA: Diagnosis not present

## 2022-11-03 DIAGNOSIS — Z992 Dependence on renal dialysis: Secondary | ICD-10-CM | POA: Diagnosis not present

## 2022-11-03 DIAGNOSIS — D631 Anemia in chronic kidney disease: Secondary | ICD-10-CM | POA: Diagnosis not present

## 2022-11-03 DIAGNOSIS — D689 Coagulation defect, unspecified: Secondary | ICD-10-CM | POA: Diagnosis not present

## 2022-11-06 DIAGNOSIS — N2581 Secondary hyperparathyroidism of renal origin: Secondary | ICD-10-CM | POA: Diagnosis not present

## 2022-11-06 DIAGNOSIS — D689 Coagulation defect, unspecified: Secondary | ICD-10-CM | POA: Diagnosis not present

## 2022-11-06 DIAGNOSIS — N186 End stage renal disease: Secondary | ICD-10-CM | POA: Diagnosis not present

## 2022-11-06 DIAGNOSIS — D509 Iron deficiency anemia, unspecified: Secondary | ICD-10-CM | POA: Diagnosis not present

## 2022-11-06 DIAGNOSIS — Z992 Dependence on renal dialysis: Secondary | ICD-10-CM | POA: Diagnosis not present

## 2022-11-06 DIAGNOSIS — D631 Anemia in chronic kidney disease: Secondary | ICD-10-CM | POA: Diagnosis not present

## 2022-11-07 ENCOUNTER — Other Ambulatory Visit: Payer: Self-pay | Admitting: Nurse Practitioner

## 2022-11-07 DIAGNOSIS — I10 Essential (primary) hypertension: Secondary | ICD-10-CM

## 2022-11-08 DIAGNOSIS — Z992 Dependence on renal dialysis: Secondary | ICD-10-CM | POA: Diagnosis not present

## 2022-11-08 DIAGNOSIS — N186 End stage renal disease: Secondary | ICD-10-CM | POA: Diagnosis not present

## 2022-11-08 DIAGNOSIS — D689 Coagulation defect, unspecified: Secondary | ICD-10-CM | POA: Diagnosis not present

## 2022-11-08 DIAGNOSIS — D509 Iron deficiency anemia, unspecified: Secondary | ICD-10-CM | POA: Diagnosis not present

## 2022-11-08 DIAGNOSIS — N2581 Secondary hyperparathyroidism of renal origin: Secondary | ICD-10-CM | POA: Diagnosis not present

## 2022-11-08 DIAGNOSIS — D631 Anemia in chronic kidney disease: Secondary | ICD-10-CM | POA: Diagnosis not present

## 2022-11-10 DIAGNOSIS — D689 Coagulation defect, unspecified: Secondary | ICD-10-CM | POA: Diagnosis not present

## 2022-11-10 DIAGNOSIS — D509 Iron deficiency anemia, unspecified: Secondary | ICD-10-CM | POA: Diagnosis not present

## 2022-11-10 DIAGNOSIS — N186 End stage renal disease: Secondary | ICD-10-CM | POA: Diagnosis not present

## 2022-11-10 DIAGNOSIS — D631 Anemia in chronic kidney disease: Secondary | ICD-10-CM | POA: Diagnosis not present

## 2022-11-10 DIAGNOSIS — N2581 Secondary hyperparathyroidism of renal origin: Secondary | ICD-10-CM | POA: Diagnosis not present

## 2022-11-10 DIAGNOSIS — Z992 Dependence on renal dialysis: Secondary | ICD-10-CM | POA: Diagnosis not present

## 2022-11-13 DIAGNOSIS — Z992 Dependence on renal dialysis: Secondary | ICD-10-CM | POA: Diagnosis not present

## 2022-11-13 DIAGNOSIS — D689 Coagulation defect, unspecified: Secondary | ICD-10-CM | POA: Diagnosis not present

## 2022-11-13 DIAGNOSIS — N186 End stage renal disease: Secondary | ICD-10-CM | POA: Diagnosis not present

## 2022-11-13 DIAGNOSIS — D631 Anemia in chronic kidney disease: Secondary | ICD-10-CM | POA: Diagnosis not present

## 2022-11-13 DIAGNOSIS — N2581 Secondary hyperparathyroidism of renal origin: Secondary | ICD-10-CM | POA: Diagnosis not present

## 2022-11-13 DIAGNOSIS — D509 Iron deficiency anemia, unspecified: Secondary | ICD-10-CM | POA: Diagnosis not present

## 2022-11-15 DIAGNOSIS — D689 Coagulation defect, unspecified: Secondary | ICD-10-CM | POA: Diagnosis not present

## 2022-11-15 DIAGNOSIS — D631 Anemia in chronic kidney disease: Secondary | ICD-10-CM | POA: Diagnosis not present

## 2022-11-15 DIAGNOSIS — N186 End stage renal disease: Secondary | ICD-10-CM | POA: Diagnosis not present

## 2022-11-15 DIAGNOSIS — D509 Iron deficiency anemia, unspecified: Secondary | ICD-10-CM | POA: Diagnosis not present

## 2022-11-15 DIAGNOSIS — Z992 Dependence on renal dialysis: Secondary | ICD-10-CM | POA: Diagnosis not present

## 2022-11-15 DIAGNOSIS — N2581 Secondary hyperparathyroidism of renal origin: Secondary | ICD-10-CM | POA: Diagnosis not present

## 2022-11-17 DIAGNOSIS — N2581 Secondary hyperparathyroidism of renal origin: Secondary | ICD-10-CM | POA: Diagnosis not present

## 2022-11-17 DIAGNOSIS — D509 Iron deficiency anemia, unspecified: Secondary | ICD-10-CM | POA: Diagnosis not present

## 2022-11-17 DIAGNOSIS — D631 Anemia in chronic kidney disease: Secondary | ICD-10-CM | POA: Diagnosis not present

## 2022-11-17 DIAGNOSIS — D689 Coagulation defect, unspecified: Secondary | ICD-10-CM | POA: Diagnosis not present

## 2022-11-17 DIAGNOSIS — N186 End stage renal disease: Secondary | ICD-10-CM | POA: Diagnosis not present

## 2022-11-17 DIAGNOSIS — Z992 Dependence on renal dialysis: Secondary | ICD-10-CM | POA: Diagnosis not present

## 2022-11-20 DIAGNOSIS — D631 Anemia in chronic kidney disease: Secondary | ICD-10-CM | POA: Diagnosis not present

## 2022-11-20 DIAGNOSIS — D689 Coagulation defect, unspecified: Secondary | ICD-10-CM | POA: Diagnosis not present

## 2022-11-20 DIAGNOSIS — D509 Iron deficiency anemia, unspecified: Secondary | ICD-10-CM | POA: Diagnosis not present

## 2022-11-20 DIAGNOSIS — N186 End stage renal disease: Secondary | ICD-10-CM | POA: Diagnosis not present

## 2022-11-20 DIAGNOSIS — Z992 Dependence on renal dialysis: Secondary | ICD-10-CM | POA: Diagnosis not present

## 2022-11-20 DIAGNOSIS — N2581 Secondary hyperparathyroidism of renal origin: Secondary | ICD-10-CM | POA: Diagnosis not present

## 2022-11-22 DIAGNOSIS — N2581 Secondary hyperparathyroidism of renal origin: Secondary | ICD-10-CM | POA: Diagnosis not present

## 2022-11-22 DIAGNOSIS — D631 Anemia in chronic kidney disease: Secondary | ICD-10-CM | POA: Diagnosis not present

## 2022-11-22 DIAGNOSIS — D509 Iron deficiency anemia, unspecified: Secondary | ICD-10-CM | POA: Diagnosis not present

## 2022-11-22 DIAGNOSIS — N186 End stage renal disease: Secondary | ICD-10-CM | POA: Diagnosis not present

## 2022-11-22 DIAGNOSIS — D689 Coagulation defect, unspecified: Secondary | ICD-10-CM | POA: Diagnosis not present

## 2022-11-22 DIAGNOSIS — Z992 Dependence on renal dialysis: Secondary | ICD-10-CM | POA: Diagnosis not present

## 2022-11-24 DIAGNOSIS — N186 End stage renal disease: Secondary | ICD-10-CM | POA: Diagnosis not present

## 2022-11-24 DIAGNOSIS — Z992 Dependence on renal dialysis: Secondary | ICD-10-CM | POA: Diagnosis not present

## 2022-11-24 DIAGNOSIS — D689 Coagulation defect, unspecified: Secondary | ICD-10-CM | POA: Diagnosis not present

## 2022-11-24 DIAGNOSIS — D509 Iron deficiency anemia, unspecified: Secondary | ICD-10-CM | POA: Diagnosis not present

## 2022-11-24 DIAGNOSIS — N2581 Secondary hyperparathyroidism of renal origin: Secondary | ICD-10-CM | POA: Diagnosis not present

## 2022-11-24 DIAGNOSIS — D631 Anemia in chronic kidney disease: Secondary | ICD-10-CM | POA: Diagnosis not present

## 2022-11-26 DIAGNOSIS — D689 Coagulation defect, unspecified: Secondary | ICD-10-CM | POA: Diagnosis not present

## 2022-11-26 DIAGNOSIS — N2581 Secondary hyperparathyroidism of renal origin: Secondary | ICD-10-CM | POA: Diagnosis not present

## 2022-11-26 DIAGNOSIS — D509 Iron deficiency anemia, unspecified: Secondary | ICD-10-CM | POA: Diagnosis not present

## 2022-11-26 DIAGNOSIS — N186 End stage renal disease: Secondary | ICD-10-CM | POA: Diagnosis not present

## 2022-11-26 DIAGNOSIS — D631 Anemia in chronic kidney disease: Secondary | ICD-10-CM | POA: Diagnosis not present

## 2022-11-26 DIAGNOSIS — Z992 Dependence on renal dialysis: Secondary | ICD-10-CM | POA: Diagnosis not present

## 2022-11-29 DIAGNOSIS — Z992 Dependence on renal dialysis: Secondary | ICD-10-CM | POA: Diagnosis not present

## 2022-11-29 DIAGNOSIS — D631 Anemia in chronic kidney disease: Secondary | ICD-10-CM | POA: Diagnosis not present

## 2022-11-29 DIAGNOSIS — D509 Iron deficiency anemia, unspecified: Secondary | ICD-10-CM | POA: Diagnosis not present

## 2022-11-29 DIAGNOSIS — N186 End stage renal disease: Secondary | ICD-10-CM | POA: Diagnosis not present

## 2022-11-29 DIAGNOSIS — N2581 Secondary hyperparathyroidism of renal origin: Secondary | ICD-10-CM | POA: Diagnosis not present

## 2022-11-29 DIAGNOSIS — D689 Coagulation defect, unspecified: Secondary | ICD-10-CM | POA: Diagnosis not present

## 2022-12-01 DIAGNOSIS — D631 Anemia in chronic kidney disease: Secondary | ICD-10-CM | POA: Diagnosis not present

## 2022-12-01 DIAGNOSIS — N2581 Secondary hyperparathyroidism of renal origin: Secondary | ICD-10-CM | POA: Diagnosis not present

## 2022-12-01 DIAGNOSIS — D509 Iron deficiency anemia, unspecified: Secondary | ICD-10-CM | POA: Diagnosis not present

## 2022-12-01 DIAGNOSIS — N186 End stage renal disease: Secondary | ICD-10-CM | POA: Diagnosis not present

## 2022-12-01 DIAGNOSIS — Z992 Dependence on renal dialysis: Secondary | ICD-10-CM | POA: Diagnosis not present

## 2022-12-01 DIAGNOSIS — D689 Coagulation defect, unspecified: Secondary | ICD-10-CM | POA: Diagnosis not present

## 2022-12-03 DIAGNOSIS — Z992 Dependence on renal dialysis: Secondary | ICD-10-CM | POA: Diagnosis not present

## 2022-12-03 DIAGNOSIS — N186 End stage renal disease: Secondary | ICD-10-CM | POA: Diagnosis not present

## 2022-12-03 DIAGNOSIS — N040 Nephrotic syndrome with minor glomerular abnormality: Secondary | ICD-10-CM | POA: Diagnosis not present

## 2022-12-03 DIAGNOSIS — N2581 Secondary hyperparathyroidism of renal origin: Secondary | ICD-10-CM | POA: Diagnosis not present

## 2022-12-03 DIAGNOSIS — D509 Iron deficiency anemia, unspecified: Secondary | ICD-10-CM | POA: Diagnosis not present

## 2022-12-03 DIAGNOSIS — D689 Coagulation defect, unspecified: Secondary | ICD-10-CM | POA: Diagnosis not present

## 2022-12-03 DIAGNOSIS — D631 Anemia in chronic kidney disease: Secondary | ICD-10-CM | POA: Diagnosis not present

## 2022-12-06 DIAGNOSIS — D631 Anemia in chronic kidney disease: Secondary | ICD-10-CM | POA: Diagnosis not present

## 2022-12-06 DIAGNOSIS — Z992 Dependence on renal dialysis: Secondary | ICD-10-CM | POA: Diagnosis not present

## 2022-12-06 DIAGNOSIS — N186 End stage renal disease: Secondary | ICD-10-CM | POA: Diagnosis not present

## 2022-12-06 DIAGNOSIS — N2581 Secondary hyperparathyroidism of renal origin: Secondary | ICD-10-CM | POA: Diagnosis not present

## 2022-12-06 DIAGNOSIS — D509 Iron deficiency anemia, unspecified: Secondary | ICD-10-CM | POA: Diagnosis not present

## 2022-12-06 DIAGNOSIS — D689 Coagulation defect, unspecified: Secondary | ICD-10-CM | POA: Diagnosis not present

## 2022-12-08 DIAGNOSIS — Z992 Dependence on renal dialysis: Secondary | ICD-10-CM | POA: Diagnosis not present

## 2022-12-08 DIAGNOSIS — D509 Iron deficiency anemia, unspecified: Secondary | ICD-10-CM | POA: Diagnosis not present

## 2022-12-08 DIAGNOSIS — H04123 Dry eye syndrome of bilateral lacrimal glands: Secondary | ICD-10-CM | POA: Diagnosis not present

## 2022-12-08 DIAGNOSIS — D689 Coagulation defect, unspecified: Secondary | ICD-10-CM | POA: Diagnosis not present

## 2022-12-08 DIAGNOSIS — N186 End stage renal disease: Secondary | ICD-10-CM | POA: Diagnosis not present

## 2022-12-08 DIAGNOSIS — N2581 Secondary hyperparathyroidism of renal origin: Secondary | ICD-10-CM | POA: Diagnosis not present

## 2022-12-08 DIAGNOSIS — D631 Anemia in chronic kidney disease: Secondary | ICD-10-CM | POA: Diagnosis not present

## 2022-12-11 ENCOUNTER — Encounter: Payer: Self-pay | Admitting: Nurse Practitioner

## 2022-12-11 ENCOUNTER — Ambulatory Visit (INDEPENDENT_AMBULATORY_CARE_PROVIDER_SITE_OTHER): Payer: Medicare Other | Admitting: Nurse Practitioner

## 2022-12-11 VITALS — BP 182/81 | HR 73 | Temp 96.5°F | Resp 16 | Ht 63.0 in | Wt 121.4 lb

## 2022-12-11 DIAGNOSIS — I10 Essential (primary) hypertension: Secondary | ICD-10-CM

## 2022-12-11 DIAGNOSIS — R058 Other specified cough: Secondary | ICD-10-CM

## 2022-12-11 DIAGNOSIS — D509 Iron deficiency anemia, unspecified: Secondary | ICD-10-CM | POA: Diagnosis not present

## 2022-12-11 DIAGNOSIS — D689 Coagulation defect, unspecified: Secondary | ICD-10-CM | POA: Diagnosis not present

## 2022-12-11 DIAGNOSIS — Z992 Dependence on renal dialysis: Secondary | ICD-10-CM | POA: Diagnosis not present

## 2022-12-11 DIAGNOSIS — Z23 Encounter for immunization: Secondary | ICD-10-CM

## 2022-12-11 DIAGNOSIS — D631 Anemia in chronic kidney disease: Secondary | ICD-10-CM | POA: Diagnosis not present

## 2022-12-11 DIAGNOSIS — R7301 Impaired fasting glucose: Secondary | ICD-10-CM

## 2022-12-11 DIAGNOSIS — N186 End stage renal disease: Secondary | ICD-10-CM | POA: Diagnosis not present

## 2022-12-11 DIAGNOSIS — N2581 Secondary hyperparathyroidism of renal origin: Secondary | ICD-10-CM | POA: Diagnosis not present

## 2022-12-11 LAB — POCT GLYCOSYLATED HEMOGLOBIN (HGB A1C): Hemoglobin A1C: 5.1 % (ref 4.0–5.6)

## 2022-12-11 MED ORDER — ZOSTER VAC RECOMB ADJUVANTED 50 MCG/0.5ML IM SUSR: 0.5000 mL | Freq: Once | INTRAMUSCULAR | 0 refills | Status: AC

## 2022-12-11 MED ORDER — BENZONATATE 100 MG PO CAPS: 100.0000 mg | ORAL_CAPSULE | Freq: Two times a day (BID) | ORAL | 3 refills | Status: DC | PRN

## 2022-12-11 MED ORDER — HYDRALAZINE HCL 25 MG PO TABS: 25.0000 mg | ORAL_TABLET | Freq: Three times a day (TID) | ORAL | 3 refills | Status: DC

## 2022-12-11 NOTE — Patient Instructions (Signed)
Stop hydralazine 10 mg   Start hydralazine 25 mg three times daily

## 2022-12-11 NOTE — Progress Notes (Signed)
[facility] [place], Kristen Francis [id number]  Internal MEDICINE  Office Visit Note  Patient Name: Kristen Francis  [id number]  [id number]  Date of Service: 12/11/2022  Chief Complaint  Patient presents with   Follow-up   Hyperlipidemia   Hypertension    HPI Kristen Francis presents for a follow-up visit for hypertension, lack of appetite and dry cough Had dialysis this morning Experiencing lack of appetite, Nausea, Low energy, fatigue -- side effects of dialysis  Dry cough -- a few months, no other symptoms, is on losartan High blood pressure even when she has dialysis. On carvedilol, amlodipine, losartan and hydralazine.    Current Medication: Outpatient Encounter Medications as of 12/11/2022  Medication Sig   amLODipine (NORVASC) 10 MG tablet TAKE 1 TABLET(10 MG) BY MOUTH DAILY   aspirin EC 81 MG EC tablet Take 1 tablet (81 mg total) by mouth daily. Swallow whole.   B Complex-C-Folic Acid (RENAL-VITE) 0.8 MG TABS Take 0.8 mg by mouth every evening. On dialysis days   benzonatate (TESSALON) 100 MG capsule Take 1 capsule (100 mg total) by mouth 2 (two) times daily as needed for cough.   carvedilol (COREG) 25 MG tablet Take 1 tablet (25 mg total) by mouth 2 (two) times daily with a meal.   cholecalciferol (VITAMIN D) 1000 units tablet Take 1,000 Units by mouth daily.    cinacalcet (SENSIPAR) 60 MG tablet Take 60 mg by mouth every evening.   ferrous sulfate 325 (65 FE) MG tablet Take 1 tablet (325 mg total) by mouth 2 (two) times daily with a meal.   hydrALAZINE (APRESOLINE) 25 MG tablet Take 1 tablet (25 mg total) by mouth 3 (three) times daily. For HTN   lidocaine-prilocaine (EMLA) cream APPLY SMALL AMOUNT TO ACCESS SITE (AVF) 3 TIMES A WEEK 1 HOUR BEFORE DIALYSIS. COVER WITH OCCLUSIVE DRESSING (SARAN WRAP)   losartan (COZAAR) 50 MG tablet Take 1 tablet (50 mg total) by mouth daily.   metoCLOPramide (REGLAN) 5 MG tablet Take 1 tablet (5 mg total) by mouth 4 (four)  times daily -  before meals and at bedtime.   omeprazole (PRILOSEC) 40 MG capsule Take 1 capsule (40 mg total) by mouth daily.   rosuvastatin (CRESTOR) 5 MG tablet TAKE 1 TABLET BY MOUTH DAILY. TAKE ON DAYS NOT HAVING DIALYSIS   sennosides-docusate sodium (SENOKOT-S) 8.6-50 MG tablet Take 2 tablets by mouth daily.   sevelamer carbonate (RENVELA) 800 MG tablet Take 1,600 mg by mouth 3 (three) times daily.   VELPHORO 500 MG chewable tablet Chew 500 mg by mouth 3 (three) times daily.   [DISCONTINUED] hydrALAZINE (APRESOLINE) 10 MG tablet TAKE 1 TABLET(10 MG) BY MOUTH TWICE DAILY   [DISCONTINUED] Zoster Vaccine Adjuvanted [facility]) injection Inject 0.5 mLs into the muscle once.   Zoster Vaccine Adjuvanted [facility]) injection Inject 0.5 mLs into the muscle once for 1 dose.   [DISCONTINUED] mirtazapine (REMERON) 7.5 MG tablet Take 1 tablet (7.5 mg total) by mouth at bedtime. (Patient not taking: No sig reported)   [DISCONTINUED] promethazine (PHENERGAN) 12.5 MG tablet Take 1 tablet (12.5 mg total) by mouth every 8 (eight) hours as needed for nausea or vomiting. (Patient not taking: No sig reported)   No facility-administered encounter medications on file as of 12/11/2022.    Surgical History: Past Surgical History:  Procedure Laterality Date   A/V FISTULAGRAM Left 12/25/2018   Procedure: A/V FISTULAGRAM;  Surgeon: Katha Cabal, MD;  Location: [facility];  Service: Cardiovascular;  Laterality: Left;   ABDOMINAL HYSTERECTOMY     APPENDECTOMY     AV FISTULA PLACEMENT Left 07/12/2018   Procedure: INSERTION OF ARTERIOVENOUS (AV) GORE-TEX GRAFT ARM ( BRACHIAL AXILLARY );  Surgeon: Katha Cabal, MD;  Location: [facility] ORS;  Service: Vascular;  Laterality: Left;   COLONOSCOPY WITH PROPOFOL N/A 01/14/2016   Procedure: COLONOSCOPY WITH PROPOFOL;  Surgeon: Manya Silvas, MD;  Location: [facility] ENDOSCOPY;  Service: Endoscopy;  Laterality: N/A;   COLONOSCOPY WITH PROPOFOL N/A 02/14/2022    Procedure: COLONOSCOPY WITH PROPOFOL;  Surgeon: Lesly Rubenstein, MD;  Location: [facility] ENDOSCOPY;  Service: Endoscopy;  Laterality: N/A;   TONSILLECTOMY      Medical History: Past Medical History:  Diagnosis Date   Anemia    Arthritis    Chronic kidney disease    ESRD   Complication of anesthesia    had procedure and felt incision early 80s   Gout    Hyperlipemia    Hypertension    Macular degeneration, right eye    Stroke [facility])     Family History: Family History  Problem Relation Age of Onset   Kidney disease Mother    Breast cancer Neg Hx     Social History   Socioeconomic History   Marital status: Married    Spouse name: Not on file   Number of children: Not on file   Years of education: Not on file   Highest education level: Not on file  Occupational History   Not on file  Tobacco Use   Smoking status: Former   Smokeless tobacco: Never  Vaping Use   Vaping Use: Never used  Substance and Sexual Activity   Alcohol use: No   Drug use: No   Sexual activity: Not on file  Other Topics Concern   Not on file  Social History Narrative   Lives in  [place]; used to clean; no smoking; no alcohol.    Social Determinants of Health   Financial Resource Strain: Not on file  Food Insecurity: Not on file  Transportation Needs: Not on file  Physical Activity: Not on file  Stress: Not on file  Social Connections: Not on file  Intimate Partner Violence: Not on file      Review of Systems  Constitutional:  Positive for appetite change and fatigue. Negative for chills and unexpected weight change.  HENT:  Negative for congestion, postnasal drip, rhinorrhea, sneezing and sore throat.   Eyes:  Negative for redness.  Respiratory:  Negative for cough, chest tightness and shortness of breath.   Cardiovascular:  Negative for chest pain and palpitations.  Gastrointestinal:  Positive for abdominal pain and nausea. Negative for constipation, diarrhea and vomiting.   Genitourinary:  Negative for dysuria and frequency.  Musculoskeletal:  Negative for arthralgias, back pain, joint swelling and neck pain.  Skin:  Negative for rash.  Neurological: Negative.  Negative for tremors and numbness.  Hematological:  Negative for adenopathy. Does not bruise/bleed easily.  Psychiatric/Behavioral:  Negative for behavioral problems (Depression), sleep disturbance and suicidal ideas. The patient is not nervous/anxious.     Vital Signs: BP (!) 182/81   Pulse 73   Temp (!) 96.5 F (35.8 C)   Resp 16   Ht '5\' 3"'$  (1.6 m)   Wt 121 lb 6.4 oz (55.1 kg)   SpO2 98%   BMI 21.51 kg/m    Physical Exam Vitals and nursing note reviewed.  Constitutional:      General: She is not in  acute distress.    Appearance: Normal appearance. She is well-developed and normal weight. She is not diaphoretic.  HENT:     Head: Normocephalic and atraumatic.     Mouth/Throat:     Pharynx: No oropharyngeal exudate.  Eyes:     Pupils: Pupils are equal, round, and reactive to light.  Neck:     Thyroid: No thyromegaly.     Vascular: No JVD.     Trachea: No tracheal deviation.  Cardiovascular:     Rate and Rhythm: Normal rate and regular rhythm.  Pulmonary:     Effort: Pulmonary effort is normal.     Breath sounds: No wheezing or rales.  Chest:     Chest wall: No tenderness.  Abdominal:     General: Bowel sounds are normal.     Palpations: Abdomen is soft.     Tenderness: There is no abdominal tenderness.  Musculoskeletal:        General: Normal range of motion.     Cervical back: Normal range of motion and neck supple.  Lymphadenopathy:     Cervical: No cervical adenopathy.  Skin:    General: Skin is warm and dry.  Neurological:     Mental Status: She is alert and oriented to person, place, and time.     Cranial Nerves: No cranial nerve deficit.  Psychiatric:        Behavior: Behavior normal.        Thought Content: Thought content normal.        Judgment: Judgment  normal.        Assessment/Plan: 1. Essential hypertension Hydralazine dose increased, follow up in 4 weeks - hydrALAZINE (APRESOLINE) 25 MG tablet; Take 1 tablet (25 mg total) by mouth 3 (three) times daily. For HTN  Dispense: 90 tablet; Refill: 3  2. Dry cough Medication prescribed to decreased cough. Cough most likely a side effect of losartan, will continue to monitor.  - benzonatate (TESSALON) 100 MG capsule; Take 1 capsule (100 mg total) by mouth 2 (two) times daily as needed for cough.  Dispense: 60 capsule; Refill: 3  3. Impaired fasting glucose A1c is normal, 5.1 - POCT glycosylated hemoglobin (Hb A1C)  4. Need for vaccination - Zoster Vaccine Adjuvanted [facility]) injection; Inject 0.5 mLs into the muscle once for 1 dose.  Dispense: 0.5 mL; Refill: 0    General Counseling: Zuha verbalizes understanding of the findings of todays visit and agrees with plan of treatment. I have discussed any further diagnostic evaluation that may be needed or ordered today. We also reviewed her medications today. she has been encouraged to call the office with any questions or concerns that should arise related to todays visit.    Orders Placed This Encounter  Procedures   POCT glycosylated hemoglobin (Hb A1C)    Meds ordered this encounter  Medications   Zoster Vaccine Adjuvanted [facility]) injection    Sig: Inject 0.5 mLs into the muscle once for 1 dose.    Dispense:  0.5 mL    Refill:  0   benzonatate (TESSALON) 100 MG capsule    Sig: Take 1 capsule (100 mg total) by mouth 2 (two) times daily as needed for cough.    Dispense:  60 capsule    Refill:  3   hydrALAZINE (APRESOLINE) 25 MG tablet    Sig: Take 1 tablet (25 mg total) by mouth 3 (three) times daily. For HTN    Dispense:  90 tablet    Refill:  3  Note increased dose, please discontinue 10 mg dose and fill new script today.    Return in about 4 weeks (around 01/08/2023).   Total time spent:30 Minutes Time spent  includes review of chart, medications, test results, and follow up plan with the patient.   [place] Controlled Substance Database was reviewed by me.  This patient was seen by Jonetta Osgood, FNP-C in collaboration with Dr. Clayborn Bigness as a part of collaborative care agreement.   Amandalynn Pitz R. Valetta Fuller, MSN, FNP-C Internal medicine

## 2022-12-13 DIAGNOSIS — N186 End stage renal disease: Secondary | ICD-10-CM | POA: Diagnosis not present

## 2022-12-13 DIAGNOSIS — D689 Coagulation defect, unspecified: Secondary | ICD-10-CM | POA: Diagnosis not present

## 2022-12-13 DIAGNOSIS — Z992 Dependence on renal dialysis: Secondary | ICD-10-CM | POA: Diagnosis not present

## 2022-12-13 DIAGNOSIS — D631 Anemia in chronic kidney disease: Secondary | ICD-10-CM | POA: Diagnosis not present

## 2022-12-13 DIAGNOSIS — D509 Iron deficiency anemia, unspecified: Secondary | ICD-10-CM | POA: Diagnosis not present

## 2022-12-13 DIAGNOSIS — N2581 Secondary hyperparathyroidism of renal origin: Secondary | ICD-10-CM | POA: Diagnosis not present

## 2022-12-15 DIAGNOSIS — N186 End stage renal disease: Secondary | ICD-10-CM | POA: Diagnosis not present

## 2022-12-15 DIAGNOSIS — D509 Iron deficiency anemia, unspecified: Secondary | ICD-10-CM | POA: Diagnosis not present

## 2022-12-15 DIAGNOSIS — D631 Anemia in chronic kidney disease: Secondary | ICD-10-CM | POA: Diagnosis not present

## 2022-12-15 DIAGNOSIS — Z992 Dependence on renal dialysis: Secondary | ICD-10-CM | POA: Diagnosis not present

## 2022-12-15 DIAGNOSIS — N2581 Secondary hyperparathyroidism of renal origin: Secondary | ICD-10-CM | POA: Diagnosis not present

## 2022-12-15 DIAGNOSIS — D689 Coagulation defect, unspecified: Secondary | ICD-10-CM | POA: Diagnosis not present

## 2022-12-18 DIAGNOSIS — D631 Anemia in chronic kidney disease: Secondary | ICD-10-CM | POA: Diagnosis not present

## 2022-12-18 DIAGNOSIS — Z992 Dependence on renal dialysis: Secondary | ICD-10-CM | POA: Diagnosis not present

## 2022-12-18 DIAGNOSIS — N2581 Secondary hyperparathyroidism of renal origin: Secondary | ICD-10-CM | POA: Diagnosis not present

## 2022-12-18 DIAGNOSIS — N186 End stage renal disease: Secondary | ICD-10-CM | POA: Diagnosis not present

## 2022-12-18 DIAGNOSIS — D689 Coagulation defect, unspecified: Secondary | ICD-10-CM | POA: Diagnosis not present

## 2022-12-18 DIAGNOSIS — D509 Iron deficiency anemia, unspecified: Secondary | ICD-10-CM | POA: Diagnosis not present

## 2022-12-20 DIAGNOSIS — D689 Coagulation defect, unspecified: Secondary | ICD-10-CM | POA: Diagnosis not present

## 2022-12-20 DIAGNOSIS — N186 End stage renal disease: Secondary | ICD-10-CM | POA: Diagnosis not present

## 2022-12-20 DIAGNOSIS — D509 Iron deficiency anemia, unspecified: Secondary | ICD-10-CM | POA: Diagnosis not present

## 2022-12-20 DIAGNOSIS — N2581 Secondary hyperparathyroidism of renal origin: Secondary | ICD-10-CM | POA: Diagnosis not present

## 2022-12-20 DIAGNOSIS — Z992 Dependence on renal dialysis: Secondary | ICD-10-CM | POA: Diagnosis not present

## 2022-12-20 DIAGNOSIS — D631 Anemia in chronic kidney disease: Secondary | ICD-10-CM | POA: Diagnosis not present

## 2022-12-22 ENCOUNTER — Telehealth: Payer: Self-pay | Admitting: Nurse Practitioner

## 2022-12-22 DIAGNOSIS — Z992 Dependence on renal dialysis: Secondary | ICD-10-CM | POA: Diagnosis not present

## 2022-12-22 DIAGNOSIS — D509 Iron deficiency anemia, unspecified: Secondary | ICD-10-CM | POA: Diagnosis not present

## 2022-12-22 DIAGNOSIS — D689 Coagulation defect, unspecified: Secondary | ICD-10-CM | POA: Diagnosis not present

## 2022-12-22 DIAGNOSIS — D631 Anemia in chronic kidney disease: Secondary | ICD-10-CM | POA: Diagnosis not present

## 2022-12-22 DIAGNOSIS — N2581 Secondary hyperparathyroidism of renal origin: Secondary | ICD-10-CM | POA: Diagnosis not present

## 2022-12-22 DIAGNOSIS — N186 End stage renal disease: Secondary | ICD-10-CM | POA: Diagnosis not present

## 2022-12-22 NOTE — Telephone Encounter (Signed)
Most recent A1c results faxed to [facility]; [phone]-Toni 

## 2022-12-25 DIAGNOSIS — Z992 Dependence on renal dialysis: Secondary | ICD-10-CM | POA: Diagnosis not present

## 2022-12-25 DIAGNOSIS — D631 Anemia in chronic kidney disease: Secondary | ICD-10-CM | POA: Diagnosis not present

## 2022-12-25 DIAGNOSIS — D689 Coagulation defect, unspecified: Secondary | ICD-10-CM | POA: Diagnosis not present

## 2022-12-25 DIAGNOSIS — N2581 Secondary hyperparathyroidism of renal origin: Secondary | ICD-10-CM | POA: Diagnosis not present

## 2022-12-25 DIAGNOSIS — D509 Iron deficiency anemia, unspecified: Secondary | ICD-10-CM | POA: Diagnosis not present

## 2022-12-25 DIAGNOSIS — N186 End stage renal disease: Secondary | ICD-10-CM | POA: Diagnosis not present

## 2022-12-27 DIAGNOSIS — D509 Iron deficiency anemia, unspecified: Secondary | ICD-10-CM | POA: Diagnosis not present

## 2022-12-27 DIAGNOSIS — N186 End stage renal disease: Secondary | ICD-10-CM | POA: Diagnosis not present

## 2022-12-27 DIAGNOSIS — Z992 Dependence on renal dialysis: Secondary | ICD-10-CM | POA: Diagnosis not present

## 2022-12-27 DIAGNOSIS — D689 Coagulation defect, unspecified: Secondary | ICD-10-CM | POA: Diagnosis not present

## 2022-12-27 DIAGNOSIS — N2581 Secondary hyperparathyroidism of renal origin: Secondary | ICD-10-CM | POA: Diagnosis not present

## 2022-12-27 DIAGNOSIS — D631 Anemia in chronic kidney disease: Secondary | ICD-10-CM | POA: Diagnosis not present

## 2022-12-29 DIAGNOSIS — N186 End stage renal disease: Secondary | ICD-10-CM | POA: Diagnosis not present

## 2022-12-29 DIAGNOSIS — D689 Coagulation defect, unspecified: Secondary | ICD-10-CM | POA: Diagnosis not present

## 2022-12-29 DIAGNOSIS — D631 Anemia in chronic kidney disease: Secondary | ICD-10-CM | POA: Diagnosis not present

## 2022-12-29 DIAGNOSIS — N2581 Secondary hyperparathyroidism of renal origin: Secondary | ICD-10-CM | POA: Diagnosis not present

## 2022-12-29 DIAGNOSIS — D509 Iron deficiency anemia, unspecified: Secondary | ICD-10-CM | POA: Diagnosis not present

## 2022-12-29 DIAGNOSIS — Z992 Dependence on renal dialysis: Secondary | ICD-10-CM | POA: Diagnosis not present

## 2023-01-01 DIAGNOSIS — D509 Iron deficiency anemia, unspecified: Secondary | ICD-10-CM | POA: Diagnosis not present

## 2023-01-01 DIAGNOSIS — N186 End stage renal disease: Secondary | ICD-10-CM | POA: Diagnosis not present

## 2023-01-01 DIAGNOSIS — Z992 Dependence on renal dialysis: Secondary | ICD-10-CM | POA: Diagnosis not present

## 2023-01-01 DIAGNOSIS — N2581 Secondary hyperparathyroidism of renal origin: Secondary | ICD-10-CM | POA: Diagnosis not present

## 2023-01-01 DIAGNOSIS — D689 Coagulation defect, unspecified: Secondary | ICD-10-CM | POA: Diagnosis not present

## 2023-01-01 DIAGNOSIS — D631 Anemia in chronic kidney disease: Secondary | ICD-10-CM | POA: Diagnosis not present

## 2023-01-03 DIAGNOSIS — D689 Coagulation defect, unspecified: Secondary | ICD-10-CM | POA: Diagnosis not present

## 2023-01-03 DIAGNOSIS — D631 Anemia in chronic kidney disease: Secondary | ICD-10-CM | POA: Diagnosis not present

## 2023-01-03 DIAGNOSIS — Z992 Dependence on renal dialysis: Secondary | ICD-10-CM | POA: Diagnosis not present

## 2023-01-03 DIAGNOSIS — N186 End stage renal disease: Secondary | ICD-10-CM | POA: Diagnosis not present

## 2023-01-03 DIAGNOSIS — N2581 Secondary hyperparathyroidism of renal origin: Secondary | ICD-10-CM | POA: Diagnosis not present

## 2023-01-03 DIAGNOSIS — N040 Nephrotic syndrome with minor glomerular abnormality: Secondary | ICD-10-CM | POA: Diagnosis not present

## 2023-01-03 DIAGNOSIS — D509 Iron deficiency anemia, unspecified: Secondary | ICD-10-CM | POA: Diagnosis not present

## 2023-01-05 DIAGNOSIS — D631 Anemia in chronic kidney disease: Secondary | ICD-10-CM | POA: Diagnosis not present

## 2023-01-05 DIAGNOSIS — N2581 Secondary hyperparathyroidism of renal origin: Secondary | ICD-10-CM | POA: Diagnosis not present

## 2023-01-05 DIAGNOSIS — Z992 Dependence on renal dialysis: Secondary | ICD-10-CM | POA: Diagnosis not present

## 2023-01-05 DIAGNOSIS — D509 Iron deficiency anemia, unspecified: Secondary | ICD-10-CM | POA: Diagnosis not present

## 2023-01-05 DIAGNOSIS — N186 End stage renal disease: Secondary | ICD-10-CM | POA: Diagnosis not present

## 2023-01-05 DIAGNOSIS — D689 Coagulation defect, unspecified: Secondary | ICD-10-CM | POA: Diagnosis not present

## 2023-01-08 ENCOUNTER — Ambulatory Visit: Payer: Medicare Other | Admitting: Nurse Practitioner

## 2023-01-08 DIAGNOSIS — Z992 Dependence on renal dialysis: Secondary | ICD-10-CM | POA: Diagnosis not present

## 2023-01-08 DIAGNOSIS — D631 Anemia in chronic kidney disease: Secondary | ICD-10-CM | POA: Diagnosis not present

## 2023-01-08 DIAGNOSIS — N186 End stage renal disease: Secondary | ICD-10-CM | POA: Diagnosis not present

## 2023-01-08 DIAGNOSIS — N2581 Secondary hyperparathyroidism of renal origin: Secondary | ICD-10-CM | POA: Diagnosis not present

## 2023-01-08 DIAGNOSIS — D689 Coagulation defect, unspecified: Secondary | ICD-10-CM | POA: Diagnosis not present

## 2023-01-08 DIAGNOSIS — D509 Iron deficiency anemia, unspecified: Secondary | ICD-10-CM | POA: Diagnosis not present

## 2023-01-10 DIAGNOSIS — D689 Coagulation defect, unspecified: Secondary | ICD-10-CM | POA: Diagnosis not present

## 2023-01-10 DIAGNOSIS — N186 End stage renal disease: Secondary | ICD-10-CM | POA: Diagnosis not present

## 2023-01-10 DIAGNOSIS — N2581 Secondary hyperparathyroidism of renal origin: Secondary | ICD-10-CM | POA: Diagnosis not present

## 2023-01-10 DIAGNOSIS — Z992 Dependence on renal dialysis: Secondary | ICD-10-CM | POA: Diagnosis not present

## 2023-01-10 DIAGNOSIS — D509 Iron deficiency anemia, unspecified: Secondary | ICD-10-CM | POA: Diagnosis not present

## 2023-01-10 DIAGNOSIS — D631 Anemia in chronic kidney disease: Secondary | ICD-10-CM | POA: Diagnosis not present

## 2023-01-12 DIAGNOSIS — N2581 Secondary hyperparathyroidism of renal origin: Secondary | ICD-10-CM | POA: Diagnosis not present

## 2023-01-12 DIAGNOSIS — N186 End stage renal disease: Secondary | ICD-10-CM | POA: Diagnosis not present

## 2023-01-12 DIAGNOSIS — D509 Iron deficiency anemia, unspecified: Secondary | ICD-10-CM | POA: Diagnosis not present

## 2023-01-12 DIAGNOSIS — D631 Anemia in chronic kidney disease: Secondary | ICD-10-CM | POA: Diagnosis not present

## 2023-01-12 DIAGNOSIS — Z992 Dependence on renal dialysis: Secondary | ICD-10-CM | POA: Diagnosis not present

## 2023-01-12 DIAGNOSIS — D689 Coagulation defect, unspecified: Secondary | ICD-10-CM | POA: Diagnosis not present

## 2023-01-15 DIAGNOSIS — Z992 Dependence on renal dialysis: Secondary | ICD-10-CM | POA: Diagnosis not present

## 2023-01-15 DIAGNOSIS — N186 End stage renal disease: Secondary | ICD-10-CM | POA: Diagnosis not present

## 2023-01-15 DIAGNOSIS — N2581 Secondary hyperparathyroidism of renal origin: Secondary | ICD-10-CM | POA: Diagnosis not present

## 2023-01-15 DIAGNOSIS — D689 Coagulation defect, unspecified: Secondary | ICD-10-CM | POA: Diagnosis not present

## 2023-01-15 DIAGNOSIS — D509 Iron deficiency anemia, unspecified: Secondary | ICD-10-CM | POA: Diagnosis not present

## 2023-01-15 DIAGNOSIS — D631 Anemia in chronic kidney disease: Secondary | ICD-10-CM | POA: Diagnosis not present

## 2023-01-17 DIAGNOSIS — D689 Coagulation defect, unspecified: Secondary | ICD-10-CM | POA: Diagnosis not present

## 2023-01-17 DIAGNOSIS — N2581 Secondary hyperparathyroidism of renal origin: Secondary | ICD-10-CM | POA: Diagnosis not present

## 2023-01-17 DIAGNOSIS — D509 Iron deficiency anemia, unspecified: Secondary | ICD-10-CM | POA: Diagnosis not present

## 2023-01-17 DIAGNOSIS — Z992 Dependence on renal dialysis: Secondary | ICD-10-CM | POA: Diagnosis not present

## 2023-01-17 DIAGNOSIS — D631 Anemia in chronic kidney disease: Secondary | ICD-10-CM | POA: Diagnosis not present

## 2023-01-17 DIAGNOSIS — N186 End stage renal disease: Secondary | ICD-10-CM | POA: Diagnosis not present

## 2023-01-19 DIAGNOSIS — Z992 Dependence on renal dialysis: Secondary | ICD-10-CM | POA: Diagnosis not present

## 2023-01-19 DIAGNOSIS — D509 Iron deficiency anemia, unspecified: Secondary | ICD-10-CM | POA: Diagnosis not present

## 2023-01-19 DIAGNOSIS — D689 Coagulation defect, unspecified: Secondary | ICD-10-CM | POA: Diagnosis not present

## 2023-01-19 DIAGNOSIS — D631 Anemia in chronic kidney disease: Secondary | ICD-10-CM | POA: Diagnosis not present

## 2023-01-19 DIAGNOSIS — N186 End stage renal disease: Secondary | ICD-10-CM | POA: Diagnosis not present

## 2023-01-19 DIAGNOSIS — N2581 Secondary hyperparathyroidism of renal origin: Secondary | ICD-10-CM | POA: Diagnosis not present

## 2023-01-22 DIAGNOSIS — D631 Anemia in chronic kidney disease: Secondary | ICD-10-CM | POA: Diagnosis not present

## 2023-01-22 DIAGNOSIS — Z992 Dependence on renal dialysis: Secondary | ICD-10-CM | POA: Diagnosis not present

## 2023-01-22 DIAGNOSIS — D689 Coagulation defect, unspecified: Secondary | ICD-10-CM | POA: Diagnosis not present

## 2023-01-22 DIAGNOSIS — N2581 Secondary hyperparathyroidism of renal origin: Secondary | ICD-10-CM | POA: Diagnosis not present

## 2023-01-22 DIAGNOSIS — D509 Iron deficiency anemia, unspecified: Secondary | ICD-10-CM | POA: Diagnosis not present

## 2023-01-22 DIAGNOSIS — N186 End stage renal disease: Secondary | ICD-10-CM | POA: Diagnosis not present

## 2023-01-24 DIAGNOSIS — D689 Coagulation defect, unspecified: Secondary | ICD-10-CM | POA: Diagnosis not present

## 2023-01-24 DIAGNOSIS — D509 Iron deficiency anemia, unspecified: Secondary | ICD-10-CM | POA: Diagnosis not present

## 2023-01-24 DIAGNOSIS — Z992 Dependence on renal dialysis: Secondary | ICD-10-CM | POA: Diagnosis not present

## 2023-01-24 DIAGNOSIS — N186 End stage renal disease: Secondary | ICD-10-CM | POA: Diagnosis not present

## 2023-01-24 DIAGNOSIS — D631 Anemia in chronic kidney disease: Secondary | ICD-10-CM | POA: Diagnosis not present

## 2023-01-24 DIAGNOSIS — N2581 Secondary hyperparathyroidism of renal origin: Secondary | ICD-10-CM | POA: Diagnosis not present

## 2023-01-26 DIAGNOSIS — Z992 Dependence on renal dialysis: Secondary | ICD-10-CM | POA: Diagnosis not present

## 2023-01-26 DIAGNOSIS — D689 Coagulation defect, unspecified: Secondary | ICD-10-CM | POA: Diagnosis not present

## 2023-01-26 DIAGNOSIS — N2581 Secondary hyperparathyroidism of renal origin: Secondary | ICD-10-CM | POA: Diagnosis not present

## 2023-01-26 DIAGNOSIS — D509 Iron deficiency anemia, unspecified: Secondary | ICD-10-CM | POA: Diagnosis not present

## 2023-01-26 DIAGNOSIS — N186 End stage renal disease: Secondary | ICD-10-CM | POA: Diagnosis not present

## 2023-01-26 DIAGNOSIS — D631 Anemia in chronic kidney disease: Secondary | ICD-10-CM | POA: Diagnosis not present

## 2023-01-29 DIAGNOSIS — N2581 Secondary hyperparathyroidism of renal origin: Secondary | ICD-10-CM | POA: Diagnosis not present

## 2023-01-29 DIAGNOSIS — D689 Coagulation defect, unspecified: Secondary | ICD-10-CM | POA: Diagnosis not present

## 2023-01-29 DIAGNOSIS — Z992 Dependence on renal dialysis: Secondary | ICD-10-CM | POA: Diagnosis not present

## 2023-01-29 DIAGNOSIS — N186 End stage renal disease: Secondary | ICD-10-CM | POA: Diagnosis not present

## 2023-01-29 DIAGNOSIS — D631 Anemia in chronic kidney disease: Secondary | ICD-10-CM | POA: Diagnosis not present

## 2023-01-29 DIAGNOSIS — D509 Iron deficiency anemia, unspecified: Secondary | ICD-10-CM | POA: Diagnosis not present

## 2023-01-31 DIAGNOSIS — Z992 Dependence on renal dialysis: Secondary | ICD-10-CM | POA: Diagnosis not present

## 2023-01-31 DIAGNOSIS — N2581 Secondary hyperparathyroidism of renal origin: Secondary | ICD-10-CM | POA: Diagnosis not present

## 2023-01-31 DIAGNOSIS — D689 Coagulation defect, unspecified: Secondary | ICD-10-CM | POA: Diagnosis not present

## 2023-01-31 DIAGNOSIS — D509 Iron deficiency anemia, unspecified: Secondary | ICD-10-CM | POA: Diagnosis not present

## 2023-01-31 DIAGNOSIS — N186 End stage renal disease: Secondary | ICD-10-CM | POA: Diagnosis not present

## 2023-01-31 DIAGNOSIS — D631 Anemia in chronic kidney disease: Secondary | ICD-10-CM | POA: Diagnosis not present

## 2023-02-01 DIAGNOSIS — N186 End stage renal disease: Secondary | ICD-10-CM | POA: Diagnosis not present

## 2023-02-01 DIAGNOSIS — N040 Nephrotic syndrome with minor glomerular abnormality: Secondary | ICD-10-CM | POA: Diagnosis not present

## 2023-02-01 DIAGNOSIS — Z992 Dependence on renal dialysis: Secondary | ICD-10-CM | POA: Diagnosis not present

## 2023-02-02 DIAGNOSIS — N2581 Secondary hyperparathyroidism of renal origin: Secondary | ICD-10-CM | POA: Diagnosis not present

## 2023-02-02 DIAGNOSIS — N186 End stage renal disease: Secondary | ICD-10-CM | POA: Diagnosis not present

## 2023-02-02 DIAGNOSIS — Z992 Dependence on renal dialysis: Secondary | ICD-10-CM | POA: Diagnosis not present

## 2023-02-02 DIAGNOSIS — D631 Anemia in chronic kidney disease: Secondary | ICD-10-CM | POA: Diagnosis not present

## 2023-02-02 DIAGNOSIS — D509 Iron deficiency anemia, unspecified: Secondary | ICD-10-CM | POA: Diagnosis not present

## 2023-02-02 DIAGNOSIS — D689 Coagulation defect, unspecified: Secondary | ICD-10-CM | POA: Diagnosis not present

## 2023-02-05 DIAGNOSIS — D689 Coagulation defect, unspecified: Secondary | ICD-10-CM | POA: Diagnosis not present

## 2023-02-05 DIAGNOSIS — D509 Iron deficiency anemia, unspecified: Secondary | ICD-10-CM | POA: Diagnosis not present

## 2023-02-05 DIAGNOSIS — N186 End stage renal disease: Secondary | ICD-10-CM | POA: Diagnosis not present

## 2023-02-05 DIAGNOSIS — N2581 Secondary hyperparathyroidism of renal origin: Secondary | ICD-10-CM | POA: Diagnosis not present

## 2023-02-05 DIAGNOSIS — D631 Anemia in chronic kidney disease: Secondary | ICD-10-CM | POA: Diagnosis not present

## 2023-02-05 DIAGNOSIS — Z992 Dependence on renal dialysis: Secondary | ICD-10-CM | POA: Diagnosis not present

## 2023-02-07 DIAGNOSIS — D509 Iron deficiency anemia, unspecified: Secondary | ICD-10-CM | POA: Diagnosis not present

## 2023-02-07 DIAGNOSIS — N186 End stage renal disease: Secondary | ICD-10-CM | POA: Diagnosis not present

## 2023-02-07 DIAGNOSIS — D631 Anemia in chronic kidney disease: Secondary | ICD-10-CM | POA: Diagnosis not present

## 2023-02-07 DIAGNOSIS — N2581 Secondary hyperparathyroidism of renal origin: Secondary | ICD-10-CM | POA: Diagnosis not present

## 2023-02-07 DIAGNOSIS — Z992 Dependence on renal dialysis: Secondary | ICD-10-CM | POA: Diagnosis not present

## 2023-02-07 DIAGNOSIS — D689 Coagulation defect, unspecified: Secondary | ICD-10-CM | POA: Diagnosis not present

## 2023-02-09 DIAGNOSIS — Z992 Dependence on renal dialysis: Secondary | ICD-10-CM | POA: Diagnosis not present

## 2023-02-09 DIAGNOSIS — D509 Iron deficiency anemia, unspecified: Secondary | ICD-10-CM | POA: Diagnosis not present

## 2023-02-09 DIAGNOSIS — N2581 Secondary hyperparathyroidism of renal origin: Secondary | ICD-10-CM | POA: Diagnosis not present

## 2023-02-09 DIAGNOSIS — N186 End stage renal disease: Secondary | ICD-10-CM | POA: Diagnosis not present

## 2023-02-09 DIAGNOSIS — D631 Anemia in chronic kidney disease: Secondary | ICD-10-CM | POA: Diagnosis not present

## 2023-02-09 DIAGNOSIS — D689 Coagulation defect, unspecified: Secondary | ICD-10-CM | POA: Diagnosis not present

## 2023-02-12 DIAGNOSIS — N186 End stage renal disease: Secondary | ICD-10-CM | POA: Diagnosis not present

## 2023-02-12 DIAGNOSIS — D509 Iron deficiency anemia, unspecified: Secondary | ICD-10-CM | POA: Diagnosis not present

## 2023-02-12 DIAGNOSIS — Z992 Dependence on renal dialysis: Secondary | ICD-10-CM | POA: Diagnosis not present

## 2023-02-12 DIAGNOSIS — D631 Anemia in chronic kidney disease: Secondary | ICD-10-CM | POA: Diagnosis not present

## 2023-02-12 DIAGNOSIS — N2581 Secondary hyperparathyroidism of renal origin: Secondary | ICD-10-CM | POA: Diagnosis not present

## 2023-02-12 DIAGNOSIS — D689 Coagulation defect, unspecified: Secondary | ICD-10-CM | POA: Diagnosis not present

## 2023-02-13 DIAGNOSIS — Z992 Dependence on renal dialysis: Secondary | ICD-10-CM | POA: Diagnosis not present

## 2023-02-13 DIAGNOSIS — D689 Coagulation defect, unspecified: Secondary | ICD-10-CM | POA: Diagnosis not present

## 2023-02-13 DIAGNOSIS — D631 Anemia in chronic kidney disease: Secondary | ICD-10-CM | POA: Diagnosis not present

## 2023-02-13 DIAGNOSIS — N2581 Secondary hyperparathyroidism of renal origin: Secondary | ICD-10-CM | POA: Diagnosis not present

## 2023-02-13 DIAGNOSIS — D509 Iron deficiency anemia, unspecified: Secondary | ICD-10-CM | POA: Diagnosis not present

## 2023-02-13 DIAGNOSIS — N186 End stage renal disease: Secondary | ICD-10-CM | POA: Diagnosis not present

## 2023-02-14 DIAGNOSIS — D631 Anemia in chronic kidney disease: Secondary | ICD-10-CM | POA: Diagnosis not present

## 2023-02-14 DIAGNOSIS — D509 Iron deficiency anemia, unspecified: Secondary | ICD-10-CM | POA: Diagnosis not present

## 2023-02-14 DIAGNOSIS — N186 End stage renal disease: Secondary | ICD-10-CM | POA: Diagnosis not present

## 2023-02-14 DIAGNOSIS — Z992 Dependence on renal dialysis: Secondary | ICD-10-CM | POA: Diagnosis not present

## 2023-02-14 DIAGNOSIS — N2581 Secondary hyperparathyroidism of renal origin: Secondary | ICD-10-CM | POA: Diagnosis not present

## 2023-02-14 DIAGNOSIS — D689 Coagulation defect, unspecified: Secondary | ICD-10-CM | POA: Diagnosis not present

## 2023-02-16 DIAGNOSIS — N186 End stage renal disease: Secondary | ICD-10-CM | POA: Diagnosis not present

## 2023-02-16 DIAGNOSIS — N2581 Secondary hyperparathyroidism of renal origin: Secondary | ICD-10-CM | POA: Diagnosis not present

## 2023-02-16 DIAGNOSIS — D689 Coagulation defect, unspecified: Secondary | ICD-10-CM | POA: Diagnosis not present

## 2023-02-16 DIAGNOSIS — Z992 Dependence on renal dialysis: Secondary | ICD-10-CM | POA: Diagnosis not present

## 2023-02-16 DIAGNOSIS — D509 Iron deficiency anemia, unspecified: Secondary | ICD-10-CM | POA: Diagnosis not present

## 2023-02-16 DIAGNOSIS — D631 Anemia in chronic kidney disease: Secondary | ICD-10-CM | POA: Diagnosis not present

## 2023-02-19 DIAGNOSIS — D631 Anemia in chronic kidney disease: Secondary | ICD-10-CM | POA: Diagnosis not present

## 2023-02-19 DIAGNOSIS — D689 Coagulation defect, unspecified: Secondary | ICD-10-CM | POA: Diagnosis not present

## 2023-02-19 DIAGNOSIS — N186 End stage renal disease: Secondary | ICD-10-CM | POA: Diagnosis not present

## 2023-02-19 DIAGNOSIS — N2581 Secondary hyperparathyroidism of renal origin: Secondary | ICD-10-CM | POA: Diagnosis not present

## 2023-02-19 DIAGNOSIS — D509 Iron deficiency anemia, unspecified: Secondary | ICD-10-CM | POA: Diagnosis not present

## 2023-02-19 DIAGNOSIS — Z992 Dependence on renal dialysis: Secondary | ICD-10-CM | POA: Diagnosis not present

## 2023-02-21 DIAGNOSIS — N186 End stage renal disease: Secondary | ICD-10-CM | POA: Diagnosis not present

## 2023-02-21 DIAGNOSIS — D689 Coagulation defect, unspecified: Secondary | ICD-10-CM | POA: Diagnosis not present

## 2023-02-21 DIAGNOSIS — N2581 Secondary hyperparathyroidism of renal origin: Secondary | ICD-10-CM | POA: Diagnosis not present

## 2023-02-21 DIAGNOSIS — Z992 Dependence on renal dialysis: Secondary | ICD-10-CM | POA: Diagnosis not present

## 2023-02-21 DIAGNOSIS — D509 Iron deficiency anemia, unspecified: Secondary | ICD-10-CM | POA: Diagnosis not present

## 2023-02-21 DIAGNOSIS — D631 Anemia in chronic kidney disease: Secondary | ICD-10-CM | POA: Diagnosis not present

## 2023-02-23 DIAGNOSIS — D509 Iron deficiency anemia, unspecified: Secondary | ICD-10-CM | POA: Diagnosis not present

## 2023-02-23 DIAGNOSIS — D631 Anemia in chronic kidney disease: Secondary | ICD-10-CM | POA: Diagnosis not present

## 2023-02-23 DIAGNOSIS — N2581 Secondary hyperparathyroidism of renal origin: Secondary | ICD-10-CM | POA: Diagnosis not present

## 2023-02-23 DIAGNOSIS — Z992 Dependence on renal dialysis: Secondary | ICD-10-CM | POA: Diagnosis not present

## 2023-02-23 DIAGNOSIS — D689 Coagulation defect, unspecified: Secondary | ICD-10-CM | POA: Diagnosis not present

## 2023-02-23 DIAGNOSIS — N186 End stage renal disease: Secondary | ICD-10-CM | POA: Diagnosis not present

## 2023-02-26 DIAGNOSIS — N2581 Secondary hyperparathyroidism of renal origin: Secondary | ICD-10-CM | POA: Diagnosis not present

## 2023-02-26 DIAGNOSIS — D689 Coagulation defect, unspecified: Secondary | ICD-10-CM | POA: Diagnosis not present

## 2023-02-26 DIAGNOSIS — D509 Iron deficiency anemia, unspecified: Secondary | ICD-10-CM | POA: Diagnosis not present

## 2023-02-26 DIAGNOSIS — N186 End stage renal disease: Secondary | ICD-10-CM | POA: Diagnosis not present

## 2023-02-26 DIAGNOSIS — D631 Anemia in chronic kidney disease: Secondary | ICD-10-CM | POA: Diagnosis not present

## 2023-02-26 DIAGNOSIS — Z992 Dependence on renal dialysis: Secondary | ICD-10-CM | POA: Diagnosis not present

## 2023-03-01 ENCOUNTER — Emergency Department: Payer: Medicare Other

## 2023-03-01 ENCOUNTER — Other Ambulatory Visit: Payer: Self-pay

## 2023-03-01 ENCOUNTER — Inpatient Hospital Stay
Admission: EM | Admit: 2023-03-01 | Discharge: 2023-03-06 | DRG: 871 | Disposition: A | Discharge status: Home Health | Payer: Medicare Other | Attending: Hospitalist | Admitting: Hospitalist

## 2023-03-01 DIAGNOSIS — I120 Hypertensive chronic kidney disease with stage 5 chronic kidney disease or end stage renal disease: Secondary | ICD-10-CM | POA: Diagnosis present

## 2023-03-01 DIAGNOSIS — R29704 NIHSS score 4: Secondary | ICD-10-CM | POA: Diagnosis not present

## 2023-03-01 DIAGNOSIS — M109 Gout, unspecified: Secondary | ICD-10-CM | POA: Diagnosis not present

## 2023-03-01 DIAGNOSIS — R4182 Altered mental status, unspecified: Secondary | ICD-10-CM | POA: Diagnosis not present

## 2023-03-01 DIAGNOSIS — G8191 Hemiplegia, unspecified affecting right dominant side: Secondary | ICD-10-CM | POA: Diagnosis not present

## 2023-03-01 DIAGNOSIS — G8314 Monoplegia of lower limb affecting left nondominant side: Secondary | ICD-10-CM | POA: Diagnosis not present

## 2023-03-01 DIAGNOSIS — Z7982 Long term (current) use of aspirin: Secondary | ICD-10-CM

## 2023-03-01 DIAGNOSIS — N2581 Secondary hyperparathyroidism of renal origin: Secondary | ICD-10-CM | POA: Diagnosis not present

## 2023-03-01 DIAGNOSIS — R0603 Acute respiratory distress: Principal | ICD-10-CM

## 2023-03-01 DIAGNOSIS — I10 Essential (primary) hypertension: Secondary | ICD-10-CM | POA: Diagnosis present

## 2023-03-01 DIAGNOSIS — Z9049 Acquired absence of other specified parts of digestive tract: Secondary | ICD-10-CM

## 2023-03-01 DIAGNOSIS — D631 Anemia in chronic kidney disease: Secondary | ICD-10-CM | POA: Diagnosis present

## 2023-03-01 DIAGNOSIS — D649 Anemia, unspecified: Secondary | ICD-10-CM | POA: Diagnosis present

## 2023-03-01 DIAGNOSIS — Z8673 Personal history of transient ischemic attack (TIA), and cerebral infarction without residual deficits: Secondary | ICD-10-CM | POA: Diagnosis not present

## 2023-03-01 DIAGNOSIS — E1129 Type 2 diabetes mellitus with other diabetic kidney complication: Secondary | ICD-10-CM | POA: Diagnosis present

## 2023-03-01 DIAGNOSIS — Z90710 Acquired absence of both cervix and uterus: Secondary | ICD-10-CM

## 2023-03-01 DIAGNOSIS — J189 Pneumonia, unspecified organism: Secondary | ICD-10-CM

## 2023-03-01 DIAGNOSIS — Z992 Dependence on renal dialysis: Secondary | ICD-10-CM

## 2023-03-01 DIAGNOSIS — N186 End stage renal disease: Secondary | ICD-10-CM | POA: Diagnosis not present

## 2023-03-01 DIAGNOSIS — E042 Nontoxic multinodular goiter: Secondary | ICD-10-CM | POA: Diagnosis not present

## 2023-03-01 DIAGNOSIS — Z841 Family history of disorders of kidney and ureter: Secondary | ICD-10-CM

## 2023-03-01 DIAGNOSIS — R059 Cough, unspecified: Secondary | ICD-10-CM | POA: Diagnosis not present

## 2023-03-01 DIAGNOSIS — K219 Gastro-esophageal reflux disease without esophagitis: Secondary | ICD-10-CM | POA: Diagnosis present

## 2023-03-01 DIAGNOSIS — Z87891 Personal history of nicotine dependence: Secondary | ICD-10-CM

## 2023-03-01 DIAGNOSIS — Z888 Allergy status to other drugs, medicaments and biological substances status: Secondary | ICD-10-CM

## 2023-03-01 DIAGNOSIS — J9601 Acute respiratory failure with hypoxia: Secondary | ICD-10-CM | POA: Diagnosis present

## 2023-03-01 DIAGNOSIS — R079 Chest pain, unspecified: Secondary | ICD-10-CM | POA: Diagnosis not present

## 2023-03-01 DIAGNOSIS — Z79899 Other long term (current) drug therapy: Secondary | ICD-10-CM

## 2023-03-01 DIAGNOSIS — R6889 Other general symptoms and signs: Secondary | ICD-10-CM | POA: Diagnosis not present

## 2023-03-01 DIAGNOSIS — R0602 Shortness of breath: Secondary | ICD-10-CM | POA: Diagnosis not present

## 2023-03-01 DIAGNOSIS — Z743 Need for continuous supervision: Secondary | ICD-10-CM | POA: Diagnosis not present

## 2023-03-01 DIAGNOSIS — H3530 Unspecified macular degeneration: Secondary | ICD-10-CM | POA: Diagnosis present

## 2023-03-01 DIAGNOSIS — A419 Sepsis, unspecified organism: Secondary | ICD-10-CM | POA: Diagnosis not present

## 2023-03-01 DIAGNOSIS — E785 Hyperlipidemia, unspecified: Secondary | ICD-10-CM | POA: Diagnosis present

## 2023-03-01 DIAGNOSIS — R531 Weakness: Secondary | ICD-10-CM | POA: Diagnosis not present

## 2023-03-01 DIAGNOSIS — Z1152 Encounter for screening for COVID-19: Secondary | ICD-10-CM

## 2023-03-01 DIAGNOSIS — G459 Transient cerebral ischemic attack, unspecified: Secondary | ICD-10-CM | POA: Diagnosis present

## 2023-03-01 DIAGNOSIS — Z880 Allergy status to penicillin: Secondary | ICD-10-CM | POA: Diagnosis not present

## 2023-03-01 LAB — CBC WITH DIFFERENTIAL/PLATELET
Abs Immature Granulocytes: 0.03 10*3/uL (ref 0.00–0.07)
Basophils Absolute: 0.1 10*3/uL (ref 0.0–0.1)
Basophils Relative: 1 %
Eosinophils Absolute: 0.1 10*3/uL (ref 0.0–0.5)
Eosinophils Relative: 1 %
HCT: 37.7 % (ref 36.0–46.0)
Hemoglobin: 11.6 g/dL — ABNORMAL LOW (ref 12.0–15.0)
Immature Granulocytes: 0 %
Lymphocytes Relative: 15 %
Lymphs Abs: 1.2 10*3/uL (ref 0.7–4.0)
MCH: 27.8 pg (ref 26.0–34.0)
MCHC: 30.8 g/dL (ref 30.0–36.0)
MCV: 90.2 fL (ref 80.0–100.0)
Monocytes Absolute: 0.4 10*3/uL (ref 0.1–1.0)
Monocytes Relative: 5 %
Neutro Abs: 6.1 10*3/uL (ref 1.7–7.7)
Neutrophils Relative %: 78 %
Platelets: 278 10*3/uL (ref 150–400)
RBC: 4.18 MIL/uL (ref 3.87–5.11)
RDW: 19.3 % — ABNORMAL HIGH (ref 11.5–15.5)
WBC: 7.9 10*3/uL (ref 4.0–10.5)
nRBC: 0 % (ref 0.0–0.2)

## 2023-03-01 LAB — LACTIC ACID, PLASMA: Lactic Acid, Venous: 1.7 mmol/L (ref 0.5–1.9)

## 2023-03-01 MED ORDER — LORAZEPAM 2 MG/ML IJ SOLN
INTRAMUSCULAR | Status: AC
  Administered 2023-03-01: 1 mg via INTRAVENOUS
  Filled 2023-03-01: qty 1

## 2023-03-01 MED ORDER — VANCOMYCIN HCL IN DEXTROSE 1-5 GM/200ML-% IV SOLN
1000.0000 mg | Freq: Once | INTRAVENOUS | Status: AC
  Administered 2023-03-02: 1000 mg via INTRAVENOUS
  Filled 2023-03-01: qty 200

## 2023-03-01 MED ORDER — ONDANSETRON HCL 4 MG/2ML IJ SOLN
INTRAMUSCULAR | Status: AC
  Administered 2023-03-01: 4 mg via INTRAVENOUS
  Filled 2023-03-01: qty 2

## 2023-03-01 MED ORDER — ONDANSETRON HCL 4 MG/2ML IJ SOLN: 4.0000 mg | Freq: Once | INTRAMUSCULAR | Status: AC

## 2023-03-01 MED ORDER — ACETAMINOPHEN 325 MG RE SUPP
650.0000 mg | Freq: Once | RECTAL | Status: AC
  Administered 2023-03-01: 650 mg via RECTAL
  Filled 2023-03-01: qty 2

## 2023-03-01 MED ORDER — LORAZEPAM 2 MG/ML IJ SOLN: 1.0000 mg | Freq: Once | INTRAMUSCULAR | Status: AC

## 2023-03-01 MED ORDER — SODIUM CHLORIDE 0.9 % IV SOLN
2.0000 g | Freq: Once | INTRAVENOUS | Status: AC
  Administered 2023-03-01: 2 g via INTRAVENOUS
  Filled 2023-03-01: qty 12.5

## 2023-03-01 NOTE — ED Provider Notes (Signed)
[facility] Provider Note    Event Date/Time   First MD Initiated Contact with Patient 03/01/23 2224     (approximate)   History   Shortness of Breath   HPI  Kristen Francis is a 77 y.o. female who presents to the emergency department today because of concerns for shortness of breath.  History is somewhat limited secondary to respiratory distress.  The patient states that she has been short of breath for a couple of days.  She says she feels hot. Does have history of ESRD and missed dialysis Wednesday. States she did go Monday.   Physical Exam   Triage Vital Signs: ED Triage Vitals  Enc Vitals Group     BP 03/01/23 2216 (!) 192/110     Pulse Rate 03/01/23 2216 87     Resp 03/01/23 2216 (!) 26     Temp 03/01/23 2216 (!) 101.2 F (38.4 C)     Temp Source 03/01/23 2216 Oral     SpO2 03/01/23 2213 (!) 83 %     Weight --      Height --      Head Circumference --      Peak Flow --      Pain Score --      Pain Loc --      Pain Edu? --      Excl. in [place]? --     Most recent vital signs: Vitals:   03/01/23 2213 03/01/23 2216  BP:  (!) 192/110  Pulse:  87  Resp:  (!) 26  Temp:  (!) 101.2 F (38.4 C)  SpO2: (!) 83% (!) 85%   General: Awake, alert, respiratory distress. CV:  Good peripheral perfusion. Regular rate and rhythm. Resp:  Increased work of breathing. Tachypnea. Diffuse rhonchi. Abd:  No distention.     ED Results / Procedures / Treatments   Labs (all labs ordered are listed, but only abnormal results are displayed) Labs Reviewed  CULTURE, BLOOD (ROUTINE X 2)  CULTURE, BLOOD (ROUTINE X 2)  RESP PANEL BY RT-PCR (RSV, FLU A&B, COVID)  [id number]  LACTIC ACID, PLASMA  LACTIC ACID, PLASMA  COMPREHENSIVE METABOLIC PANEL  CBC WITH DIFFERENTIAL/PLATELET  URINALYSIS, W/ REFLEX TO CULTURE (INFECTION SUSPECTED)     EKG  I, Nance Pear, attending physician, personally viewed and interpreted this EKG  EKG Time: 2305 Rate:  81 Rhythm: sinus rhythm Axis: normal Intervals: qtc 511 QRS: narrow, q waves V1 ST changes: no st elevation Impression: abnormal ekg    RADIOLOGY I independently interpreted and visualized the CXR. My interpretation: diffuse airspace disease Radiology interpretation:  IMPRESSION:  Cardiac enlargement. Diffuse bilateral pulmonary infiltrates likely  edema, pneumonia, or ARDS.      PROCEDURES:  Critical Care performed: Yes  CRITICAL CARE Performed by: Nance Pear   Total critical care time: 35 minutes  Critical care time was exclusive of separately billable procedures and treating other patients.  Critical care was necessary to treat or prevent imminent or life-threatening deterioration.  Critical care was time spent personally by me on the following activities: development of treatment plan with patient and/or surrogate as well as nursing, discussions with consultants, evaluation of patient's response to treatment, examination of patient, obtaining history from patient or surrogate, ordering and performing treatments and interventions, ordering and review of laboratory studies, ordering and review of radiographic studies, pulse oximetry and re-evaluation of patient's condition.   Procedures    MEDICATIONS ORDERED IN ED: Medications  ondansetron (ZOFRAN)  4 MG/2ML injection (has no administration in time range)  LORazepam (ATIVAN) 2 MG/ML injection (has no administration in time range)  LORazepam (ATIVAN) injection 1 mg (has no administration in time range)     IMPRESSION / MDM / ASSESSMENT AND PLAN / ED COURSE  I reviewed the triage vital signs and the nursing notes.                              Differential diagnosis includes, but is not limited to, pulmonary edema, pneumonia, covid  Patient's presentation is most consistent with acute presentation with potential threat to life or bodily function.   The patient is on the cardiac monitor to evaluate for  evidence of arrhythmia and/or significant heart rate changes.   Patient presented to the emergency department today because of concerns for respiratory distress that has been ongoing for the past couple days.  On exam patient was in significant respiratory distress.  She also seems slightly agitated.  Did think patient would benefit from BiPAP however was concerned about agitation and given that patient was having a hard time tolerating the facemask did give patient a small amount of Ativan to help calm her down so she could tolerate BiPAP better.  Initial vital signs were concerning for possible infection with fever and tachypnea.  Chest x-ray was concerning for pneumonia versus pulmonary edema.  Also considered ARDS however I think pneumonia most likely given fever.  Additionally patient has history of dialysis and missed her most recent session so could have element of pulmonary edema as well.   BMP with normal potassium. Discussed with Dr. Holley Raring with nephrology. Patient still makes urine, recommended IV lasix 120mg . Will plan on getting her on the schedule for dialysis in the morning. Discussed with Dr. Posey Pronto who will plan on admission.     FINAL CLINICAL IMPRESSION(S) / ED DIAGNOSES   Final diagnoses:  Respiratory distress  Pneumonia due to infectious organism, unspecified laterality, unspecified part of lung    Note:  This document was prepared using Dragon voice recognition software and may include unintentional dictation errors.    Nance Pear, MD 03/02/23 [phone]

## 2023-03-01 NOTE — ED Notes (Signed)
Pt is placed on Bipap, RT @bedside 

## 2023-03-01 NOTE — Progress Notes (Signed)
Called down to place respiratory distress patient on bipap. Placed on 12/6 @ 60%. Patient tolerating well at this time.

## 2023-03-01 NOTE — ED Triage Notes (Signed)
BIBEMS c/o weakness, intermittent CP and SOB x1 week. Missed diaylsis on Wednesday. 190s SBP. Given 1 spray of nitro, 85% on RA, placed 15L NRB sating @98 %.

## 2023-03-01 NOTE — ED Provider Notes (Incomplete)
[facility] Provider Note    Event Date/Time   First MD Initiated Contact with Patient 03/01/23 2224     (approximate)   History   Shortness of Breath   HPI  Kristen Francis is a 77 y.o. female who presents to the emergency department today because of concerns for shortness of breath.  History is somewhat limited secondary to respiratory distress.  The patient states that she has been short of breath for a couple of days.  She says she feels hot. Does have history of ESRD and missed dialysis Wednesday. States she did go Monday.   Physical Exam   Triage Vital Signs: ED Triage Vitals  Enc Vitals Group     BP 03/01/23 2216 (!) 192/110     Pulse Rate 03/01/23 2216 87     Resp 03/01/23 2216 (!) 26     Temp 03/01/23 2216 (!) 101.2 F (38.4 C)     Temp Source 03/01/23 2216 Oral     SpO2 03/01/23 2213 (!) 83 %     Weight --      Height --      Head Circumference --      Peak Flow --      Pain Score --      Pain Loc --      Pain Edu? --      Excl. in [place]? --     Most recent vital signs: Vitals:   03/01/23 2213 03/01/23 2216  BP:  (!) 192/110  Pulse:  87  Resp:  (!) 26  Temp:  (!) 101.2 F (38.4 C)  SpO2: (!) 83% (!) 85%   General: Awake, alert, respiratory distress. CV:  Good peripheral perfusion. Regular rate and rhythm. Resp:  Increased work of breathing. Tachypnea. Diffuse rhonchi. Abd:  No distention.     ED Results / Procedures / Treatments   Labs (all labs ordered are listed, but only abnormal results are displayed) Labs Reviewed  CULTURE, BLOOD (ROUTINE X 2)  CULTURE, BLOOD (ROUTINE X 2)  RESP PANEL BY RT-PCR (RSV, FLU A&B, COVID)  [id number]  LACTIC ACID, PLASMA  LACTIC ACID, PLASMA  COMPREHENSIVE METABOLIC PANEL  CBC WITH DIFFERENTIAL/PLATELET  URINALYSIS, W/ REFLEX TO CULTURE (INFECTION SUSPECTED)     EKG  I, Nance Pear, attending physician, personally viewed and interpreted this EKG  EKG Time: 2305 Rate:  81 Rhythm: sinus rhythm Axis: normal Intervals: qtc 511 QRS: narrow, q waves V1 ST changes: no st elevation Impression: abnormal ekg    RADIOLOGY I independently interpreted and visualized the CXR. My interpretation: diffuse airspace disease Radiology interpretation:  IMPRESSION:  Cardiac enlargement. Diffuse bilateral pulmonary infiltrates likely  edema, pneumonia, or ARDS.      PROCEDURES:  Critical Care performed: Yes  CRITICAL CARE Performed by: Nance Pear   Total critical care time: *** minutes  Critical care time was exclusive of separately billable procedures and treating other patients.  Critical care was necessary to treat or prevent imminent or life-threatening deterioration.  Critical care was time spent personally by me on the following activities: development of treatment plan with patient and/or surrogate as well as nursing, discussions with consultants, evaluation of patient's response to treatment, examination of patient, obtaining history from patient or surrogate, ordering and performing treatments and interventions, ordering and review of laboratory studies, ordering and review of radiographic studies, pulse oximetry and re-evaluation of patient's condition.   Procedures    MEDICATIONS ORDERED IN ED: Medications  ondansetron (ZOFRAN)  4 MG/2ML injection (has no administration in time range)  LORazepam (ATIVAN) 2 MG/ML injection (has no administration in time range)  LORazepam (ATIVAN) injection 1 mg (has no administration in time range)     IMPRESSION / MDM / ASSESSMENT AND PLAN / ED COURSE  I reviewed the triage vital signs and the nursing notes.                              Differential diagnosis includes, but is not limited to, pulmonary edema, pneumonia, covid  Patient's presentation is most consistent with acute presentation with potential threat to life or bodily function.   The patient is on the cardiac monitor to evaluate for  evidence of arrhythmia and/or significant heart rate changes.   Patient presented to the emergency department today because of concerns for respiratory distress that has been ongoing for the past couple days.  On exam patient was in significant respiratory distress.  She also seems slightly agitated.  Did think patient would benefit from BiPAP however was concerned about agitation and given that patient was having a hard time tolerating the facemask did give patient a small amount of Ativan to help calm her down so she could tolerate BiPAP better.  Initial vital signs were concerning for possible infection with fever and tachypnea.  Chest x-ray was concerning for pneumonia versus pulmonary edema.  Also considered ARDS however I think pneumonia most likely given fever.  Additionally patient has history of dialysis and missed her most recent session so could have element of pulmonary edema as well.     FINAL CLINICAL IMPRESSION(S) / ED DIAGNOSES   Final diagnoses:  Respiratory distress  Pneumonia due to infectious organism, unspecified laterality, unspecified part of lung     Rx / DC Orders   ED Discharge Orders     None        Note:  This document was prepared using Dragon voice recognition software and may include unintentional dictation errors.

## 2023-03-02 ENCOUNTER — Inpatient Hospital Stay: Payer: Medicare Other

## 2023-03-02 DIAGNOSIS — Z87891 Personal history of nicotine dependence: Secondary | ICD-10-CM | POA: Diagnosis not present

## 2023-03-02 DIAGNOSIS — R0602 Shortness of breath: Secondary | ICD-10-CM | POA: Diagnosis not present

## 2023-03-02 DIAGNOSIS — E042 Nontoxic multinodular goiter: Secondary | ICD-10-CM | POA: Diagnosis present

## 2023-03-02 DIAGNOSIS — A419 Sepsis, unspecified organism: Secondary | ICD-10-CM | POA: Diagnosis present

## 2023-03-02 DIAGNOSIS — N040 Nephrotic syndrome with minor glomerular abnormality: Secondary | ICD-10-CM | POA: Diagnosis not present

## 2023-03-02 DIAGNOSIS — Z992 Dependence on renal dialysis: Secondary | ICD-10-CM | POA: Diagnosis not present

## 2023-03-02 DIAGNOSIS — Z90710 Acquired absence of both cervix and uterus: Secondary | ICD-10-CM | POA: Diagnosis not present

## 2023-03-02 DIAGNOSIS — G8314 Monoplegia of lower limb affecting left nondominant side: Secondary | ICD-10-CM | POA: Diagnosis not present

## 2023-03-02 DIAGNOSIS — Z7982 Long term (current) use of aspirin: Secondary | ICD-10-CM | POA: Diagnosis not present

## 2023-03-02 DIAGNOSIS — M109 Gout, unspecified: Secondary | ICD-10-CM | POA: Diagnosis present

## 2023-03-02 DIAGNOSIS — I120 Hypertensive chronic kidney disease with stage 5 chronic kidney disease or end stage renal disease: Secondary | ICD-10-CM | POA: Diagnosis not present

## 2023-03-02 DIAGNOSIS — R0603 Acute respiratory distress: Secondary | ICD-10-CM | POA: Diagnosis present

## 2023-03-02 DIAGNOSIS — Z9049 Acquired absence of other specified parts of digestive tract: Secondary | ICD-10-CM | POA: Diagnosis not present

## 2023-03-02 DIAGNOSIS — I639 Cerebral infarction, unspecified: Secondary | ICD-10-CM | POA: Diagnosis not present

## 2023-03-02 DIAGNOSIS — H3530 Unspecified macular degeneration: Secondary | ICD-10-CM | POA: Diagnosis present

## 2023-03-02 DIAGNOSIS — D631 Anemia in chronic kidney disease: Secondary | ICD-10-CM | POA: Diagnosis not present

## 2023-03-02 DIAGNOSIS — Z8673 Personal history of transient ischemic attack (TIA), and cerebral infarction without residual deficits: Secondary | ICD-10-CM | POA: Diagnosis not present

## 2023-03-02 DIAGNOSIS — Z888 Allergy status to other drugs, medicaments and biological substances status: Secondary | ICD-10-CM | POA: Diagnosis not present

## 2023-03-02 DIAGNOSIS — Z880 Allergy status to penicillin: Secondary | ICD-10-CM | POA: Diagnosis not present

## 2023-03-02 DIAGNOSIS — E785 Hyperlipidemia, unspecified: Secondary | ICD-10-CM | POA: Diagnosis present

## 2023-03-02 DIAGNOSIS — N2581 Secondary hyperparathyroidism of renal origin: Secondary | ICD-10-CM | POA: Diagnosis not present

## 2023-03-02 DIAGNOSIS — K219 Gastro-esophageal reflux disease without esophagitis: Secondary | ICD-10-CM | POA: Diagnosis present

## 2023-03-02 DIAGNOSIS — R4182 Altered mental status, unspecified: Secondary | ICD-10-CM | POA: Diagnosis not present

## 2023-03-02 DIAGNOSIS — N186 End stage renal disease: Secondary | ICD-10-CM | POA: Diagnosis not present

## 2023-03-02 DIAGNOSIS — Z1152 Encounter for screening for COVID-19: Secondary | ICD-10-CM | POA: Diagnosis not present

## 2023-03-02 DIAGNOSIS — G8191 Hemiplegia, unspecified affecting right dominant side: Secondary | ICD-10-CM | POA: Diagnosis not present

## 2023-03-02 DIAGNOSIS — J9600 Acute respiratory failure, unspecified whether with hypoxia or hypercapnia: Secondary | ICD-10-CM | POA: Diagnosis not present

## 2023-03-02 DIAGNOSIS — J9601 Acute respiratory failure with hypoxia: Secondary | ICD-10-CM | POA: Diagnosis present

## 2023-03-02 DIAGNOSIS — R29704 NIHSS score 4: Secondary | ICD-10-CM | POA: Diagnosis not present

## 2023-03-02 DIAGNOSIS — J189 Pneumonia, unspecified organism: Secondary | ICD-10-CM | POA: Diagnosis present

## 2023-03-02 LAB — CBC
HCT: 35.8 % — ABNORMAL LOW (ref 36.0–46.0)
HCT: 32.9 % — ABNORMAL LOW (ref 36.0–46.0)
Hemoglobin: 11 g/dL — ABNORMAL LOW (ref 12.0–15.0)
Hemoglobin: 10.6 g/dL — ABNORMAL LOW (ref 12.0–15.0)
MCH: 27.2 pg (ref 26.0–34.0)
MCH: 27.5 pg (ref 26.0–34.0)
MCHC: 30.7 g/dL (ref 30.0–36.0)
MCHC: 32.2 g/dL (ref 30.0–36.0)
MCV: 88.4 fL (ref 80.0–100.0)
MCV: 85.5 fL (ref 80.0–100.0)
Platelets: 245 10*3/uL (ref 150–400)
Platelets: 237 10*3/uL (ref 150–400)
RBC: 4.05 MIL/uL (ref 3.87–5.11)
RBC: 3.85 MIL/uL — ABNORMAL LOW (ref 3.87–5.11)
RDW: 18.4 % — ABNORMAL HIGH (ref 11.5–15.5)
RDW: 18.4 % — ABNORMAL HIGH (ref 11.5–15.5)
WBC: 8.2 10*3/uL (ref 4.0–10.5)
WBC: 8 10*3/uL (ref 4.0–10.5)
nRBC: 0 % (ref 0.0–0.2)
nRBC: 0.3 % — ABNORMAL HIGH (ref 0.0–0.2)

## 2023-03-02 LAB — RESP PANEL BY RT-PCR (RSV, FLU A&B, COVID)  RVPGX2
Influenza A by PCR: NEGATIVE
Influenza B by PCR: NEGATIVE
Resp Syncytial Virus by PCR: NEGATIVE
SARS Coronavirus 2 by RT PCR: NEGATIVE

## 2023-03-02 LAB — COMPREHENSIVE METABOLIC PANEL
ALT: 33 U/L (ref 0–44)
ALT: 29 U/L (ref 0–44)
AST: 39 U/L (ref 15–41)
AST: 29 U/L (ref 15–41)
Albumin: 3.9 g/dL (ref 3.5–5.0)
Albumin: 3.7 g/dL (ref 3.5–5.0)
Alkaline Phosphatase: 49 U/L (ref 38–126)
Alkaline Phosphatase: 49 U/L (ref 38–126)
Anion gap: 21 — ABNORMAL HIGH (ref 5–15)
Anion gap: 18 — ABNORMAL HIGH (ref 5–15)
BUN: 80 mg/dL — ABNORMAL HIGH (ref 8–23)
BUN: 85 mg/dL — ABNORMAL HIGH (ref 8–23)
CO2: 23 mmol/L (ref 22–32)
CO2: 24 mmol/L (ref 22–32)
Calcium: 8.7 mg/dL — ABNORMAL LOW (ref 8.9–10.3)
Calcium: 8.4 mg/dL — ABNORMAL LOW (ref 8.9–10.3)
Chloride: 98 mmol/L (ref 98–111)
Chloride: 99 mmol/L (ref 98–111)
Creatinine, Ser: 10.38 mg/dL — ABNORMAL HIGH (ref 0.44–1.00)
Creatinine, Ser: 10.6 mg/dL — ABNORMAL HIGH (ref 0.44–1.00)
GFR, Estimated: 4 mL/min — ABNORMAL LOW (ref >60)
GFR, Estimated: 3 mL/min — ABNORMAL LOW (ref >60)
Glucose, Bld: 121 mg/dL — ABNORMAL HIGH (ref 70–99)
Glucose, Bld: 115 mg/dL — ABNORMAL HIGH (ref 70–99)
Potassium: 4.1 mmol/L (ref 3.5–5.1)
Potassium: 4.3 mmol/L (ref 3.5–5.1)
Sodium: 142 mmol/L (ref 135–145)
Sodium: 141 mmol/L (ref 135–145)
Total Bilirubin: 0.9 mg/dL (ref 0.3–1.2)
Total Bilirubin: 0.9 mg/dL (ref 0.3–1.2)
Total Protein: 7.3 g/dL (ref 6.5–8.1)
Total Protein: 6.9 g/dL (ref 6.5–8.1)

## 2023-03-02 LAB — T4, FREE: Free T4: 0.87 ng/dL (ref 0.61–1.12)

## 2023-03-02 LAB — GLUCOSE, CAPILLARY
Glucose-Capillary: 105 mg/dL — ABNORMAL HIGH (ref 70–99)
Glucose-Capillary: 114 mg/dL — ABNORMAL HIGH (ref 70–99)
Glucose-Capillary: 158 mg/dL — ABNORMAL HIGH (ref 70–99)
Glucose-Capillary: 85 mg/dL (ref 70–99)

## 2023-03-02 LAB — LACTIC ACID, PLASMA: Lactic Acid, Venous: 0.6 mmol/L (ref 0.5–1.9)

## 2023-03-02 LAB — TSH: TSH: 2.809 u[IU]/mL (ref 0.350–4.500)

## 2023-03-02 LAB — PROCALCITONIN: Procalcitonin: 1.01 ng/mL

## 2023-03-02 LAB — MRSA NEXT GEN BY PCR, NASAL: MRSA by PCR Next Gen: NOT DETECTED

## 2023-03-02 LAB — HEPATITIS B SURFACE ANTIGEN: Hepatitis B Surface Ag: NONREACTIVE

## 2023-03-02 MED ORDER — CINACALCET HCL 30 MG PO TABS
60.0000 mg | ORAL_TABLET | Freq: Every evening | ORAL | Status: DC
  Administered 2023-03-02 – 2023-03-05 (×4): 60 mg via ORAL
  Filled 2023-03-02 (×7): qty 2

## 2023-03-02 MED ORDER — ALBUTEROL SULFATE (2.5 MG/3ML) 0.083% IN NEBU
2.5000 mg | INHALATION_SOLUTION | Freq: Four times a day (QID) | RESPIRATORY_TRACT | Status: DC
  Administered 2023-03-02 (×2): 2.5 mg via RESPIRATORY_TRACT
  Filled 2023-03-02 (×2): qty 3

## 2023-03-02 MED ORDER — SODIUM CHLORIDE 0.9 % IV SOLN: 250.0000 mL | INTRAVENOUS | Status: DC | PRN

## 2023-03-02 MED ORDER — HEPARIN SODIUM (PORCINE) 1000 UNIT/ML DIALYSIS: 1000.0000 [IU] | INTRAMUSCULAR | Status: DC | PRN

## 2023-03-02 MED ORDER — ALTEPLASE 2 MG IJ SOLR: 2.0000 mg | Freq: Once | INTRAMUSCULAR | Status: DC | PRN

## 2023-03-02 MED ORDER — PENTAFLUOROPROP-TETRAFLUOROETH EX AERO: 1.0000 | INHALATION_SPRAY | CUTANEOUS | Status: DC | PRN

## 2023-03-02 MED ORDER — HYDRALAZINE HCL 20 MG/ML IJ SOLN
5.0000 mg | INTRAMUSCULAR | Status: DC | PRN
  Administered 2023-03-02 – 2023-03-03 (×2): 5 mg via INTRAVENOUS
  Filled 2023-03-02 (×2): qty 1

## 2023-03-02 MED ORDER — HEPARIN SODIUM (PORCINE) 5000 UNIT/ML IJ SOLN
5000.0000 [IU] | Freq: Two times a day (BID) | INTRAMUSCULAR | Status: DC
  Administered 2023-03-02 – 2023-03-06 (×8): 5000 [IU] via SUBCUTANEOUS
  Filled 2023-03-02 (×8): qty 1

## 2023-03-02 MED ORDER — IOHEXOL 350 MG/ML SOLN: 75.0000 mL | Freq: Once | INTRAVENOUS | Status: DC | PRN

## 2023-03-02 MED ORDER — LIDOCAINE HCL (PF) 1 % IJ SOLN: 5.0000 mL | INTRAMUSCULAR | Status: DC | PRN

## 2023-03-02 MED ORDER — SODIUM CHLORIDE 0.9% FLUSH
3.0000 mL | Freq: Two times a day (BID) | INTRAVENOUS | Status: DC
  Administered 2023-03-02 – 2023-03-06 (×7): 3 mL via INTRAVENOUS

## 2023-03-02 MED ORDER — CHLORHEXIDINE GLUCONATE CLOTH 2 % EX PADS
6.0000 | MEDICATED_PAD | Freq: Every day | CUTANEOUS | Status: DC
  Administered 2023-03-02 – 2023-03-03 (×2): 6 via TOPICAL
  Filled 2023-03-02: qty 6

## 2023-03-02 MED ORDER — SODIUM CHLORIDE 0.9 % IV SOLN
2.0000 g | INTRAVENOUS | Status: DC
  Administered 2023-03-02: 2 g via INTRAVENOUS
  Filled 2023-03-02: qty 12.5

## 2023-03-02 MED ORDER — ACETAMINOPHEN 325 MG PO TABS: 650.0000 mg | ORAL_TABLET | Freq: Four times a day (QID) | ORAL | Status: DC | PRN

## 2023-03-02 MED ORDER — ROSUVASTATIN CALCIUM 10 MG PO TABS
5.0000 mg | ORAL_TABLET | ORAL | Status: DC
  Administered 2023-03-03 – 2023-03-06 (×3): 5 mg via ORAL
  Filled 2023-03-02 (×3): qty 1

## 2023-03-02 MED ORDER — SODIUM CHLORIDE 0.9% FLUSH: 3.0000 mL | INTRAVENOUS | Status: DC | PRN

## 2023-03-02 MED ORDER — INSULIN ASPART 100 UNIT/ML IJ SOLN
0.0000 [IU] | Freq: Three times a day (TID) | INTRAMUSCULAR | Status: DC
  Administered 2023-03-02: 1 [IU] via SUBCUTANEOUS
  Filled 2023-03-02: qty 1

## 2023-03-02 MED ORDER — ACETAMINOPHEN 650 MG RE SUPP: 650.0000 mg | Freq: Four times a day (QID) | RECTAL | Status: DC | PRN

## 2023-03-02 MED ORDER — INSULIN ASPART 100 UNIT/ML IJ SOLN: 0.0000 [IU] | Freq: Every day | INTRAMUSCULAR | Status: DC

## 2023-03-02 MED ORDER — SODIUM CHLORIDE 0.9% FLUSH
3.0000 mL | Freq: Two times a day (BID) | INTRAVENOUS | Status: DC
  Administered 2023-03-02 – 2023-03-06 (×10): 3 mL via INTRAVENOUS

## 2023-03-02 MED ORDER — AMLODIPINE BESYLATE 10 MG PO TABS
10.0000 mg | ORAL_TABLET | Freq: Every day | ORAL | Status: DC
  Administered 2023-03-02 – 2023-03-06 (×5): 10 mg via ORAL
  Filled 2023-03-02 (×5): qty 1

## 2023-03-02 MED ORDER — FERROUS SULFATE 325 (65 FE) MG PO TABS
325.0000 mg | ORAL_TABLET | Freq: Two times a day (BID) | ORAL | Status: DC
  Administered 2023-03-02 – 2023-03-06 (×9): 325 mg via ORAL
  Filled 2023-03-02 (×9): qty 1

## 2023-03-02 MED ORDER — HYDRALAZINE HCL 25 MG PO TABS
25.0000 mg | ORAL_TABLET | Freq: Three times a day (TID) | ORAL | Status: DC
  Administered 2023-03-02 (×2): 25 mg via ORAL
  Filled 2023-03-02 (×2): qty 1

## 2023-03-02 MED ORDER — SEVELAMER CARBONATE 800 MG PO TABS
1600.0000 mg | ORAL_TABLET | Freq: Three times a day (TID) | ORAL | Status: DC
  Administered 2023-03-02 – 2023-03-06 (×11): 1600 mg via ORAL
  Filled 2023-03-02 (×10): qty 2

## 2023-03-02 MED ORDER — CARVEDILOL 25 MG PO TABS
25.0000 mg | ORAL_TABLET | Freq: Two times a day (BID) | ORAL | Status: DC
  Administered 2023-03-02 – 2023-03-06 (×8): 25 mg via ORAL
  Filled 2023-03-02 (×8): qty 1

## 2023-03-02 MED ORDER — ASPIRIN 81 MG PO TBEC
81.0000 mg | DELAYED_RELEASE_TABLET | Freq: Every day | ORAL | Status: DC
  Administered 2023-03-02 – 2023-03-06 (×5): 81 mg via ORAL
  Filled 2023-03-02 (×5): qty 1

## 2023-03-02 MED ORDER — SUCROFERRIC OXYHYDROXIDE 500 MG PO CHEW
500.0000 mg | CHEWABLE_TABLET | Freq: Three times a day (TID) | ORAL | Status: DC
  Administered 2023-03-02 – 2023-03-06 (×11): 500 mg via ORAL
  Filled 2023-03-02 (×17): qty 1

## 2023-03-02 MED ORDER — ALBUTEROL SULFATE (2.5 MG/3ML) 0.083% IN NEBU: 2.5000 mg | INHALATION_SOLUTION | Freq: Four times a day (QID) | RESPIRATORY_TRACT | Status: DC | PRN

## 2023-03-02 MED ORDER — FUROSEMIDE 10 MG/ML IJ SOLN
120.0000 mg | Freq: Once | INTRAVENOUS | Status: AC
  Administered 2023-03-02: 120 mg via INTRAVENOUS
  Filled 2023-03-02 (×2): qty 12

## 2023-03-02 MED ORDER — LOSARTAN POTASSIUM 50 MG PO TABS
50.0000 mg | ORAL_TABLET | Freq: Every day | ORAL | Status: DC
  Administered 2023-03-02 – 2023-03-06 (×5): 50 mg via ORAL
  Filled 2023-03-02 (×5): qty 1

## 2023-03-02 MED ORDER — LIDOCAINE-PRILOCAINE 2.5-2.5 % EX CREA: 1.0000 | TOPICAL_CREAM | CUTANEOUS | Status: DC | PRN

## 2023-03-02 MED ORDER — VANCOMYCIN HCL 500 MG/100ML IV SOLN
500.0000 mg | INTRAVENOUS | Status: DC
  Administered 2023-03-02: 500 mg via INTRAVENOUS
  Filled 2023-03-02: qty 100

## 2023-03-02 NOTE — Assessment & Plan Note (Signed)
Vitals:   03/01/23 2216 03/01/23 2235 03/01/23 2300 03/01/23 2315  BP: (!) 192/110 (!) 192/95 (!) 172/87 (!) 169/79   03/01/23 2330 03/01/23 2345 03/02/23 0200  BP: (!) 161/75 (!) 160/77 (!) 177/84  Pt continued on coreg/ amlodipine / losartan and hydralazine.  Cont NIPPV. ABG/ VBG as needed.

## 2023-03-02 NOTE — Assessment & Plan Note (Signed)
Pt presenting with sob and dyspnea in respiratory distress.  SpO2: 98 % FiO2 (%): (S) 30 % On bipap. 2/2 volume overload from missed dialysis. Procalcitonin  pending.

## 2023-03-02 NOTE — Assessment & Plan Note (Signed)
Suspect underlying PNA. We will get procalcitonin.  Cont pt on vancomycin and cefepime.

## 2023-03-02 NOTE — Assessment & Plan Note (Signed)
Continue Asa/ coreg / hydralazine/ losartan .

## 2023-03-02 NOTE — Progress Notes (Signed)
Pharmacy Antibiotic Note  Sidra Brue is a 77 y.o. female admitted on 03/01/2023 with PNA, on HD Q MWF.  Pharmacy has been consulted for Vancomycin , Cefepime dosing.  Plan: Cefepime 2 gm IV X 1 given on 3/28 @ 2342. Cefepime 2 gm IV Q MWF - HD ordered to begin on 3/29.   Vancomycin 1 gm IV X 1 given in ED on 3/29 @ 0013. Vancomycin 500 mg IV Q MWF - HD to start on 3/29.   Height: 5\' 3"  (160 cm) Weight: 54.9 kg (121 lb 0.5 oz) IBW/kg (Calculated) : 52.4  Temp (24hrs), Avg:98.3 F (36.8 C), Min:96.5 F (35.8 C), Max:101.2 F (38.4 C)  Recent Labs  Lab 03/01/23 2255 03/01/23 2308 03/02/23 0202  WBC 7.9  --   --   CREATININE 10.38*  --   --   LATICACIDVEN  --  1.7 0.6    Estimated Creatinine Clearance: 3.9 mL/min (A) (by C-G formula based on SCr of 10.38 mg/dL (H)).    Allergies  Allergen Reactions   Other Other (See Comments)    Pt does not receive Blood    Penicillins Hives    Has patient had a PCN reaction causing immediate rash, facial/tongue/throat swelling, SOB or lightheadedness with hypotension: YES Has patient had a PCN reaction causing severe rash involving mucus membranes or skin necrosis: no Has patient had a PCN reaction that required hospitalization: no Has patient had a PCN reaction occurring within the last 10 years: no If all of the above answers are "NO", then may proceed with Cephalosporin use.    Sodium Bicarbonate Itching    Antimicrobials this admission:   >>    >>   Dose adjustments this admission:   Microbiology results:  BCx:   UCx:    Sputum:    MRSA PCR:   Thank you for allowing pharmacy to be a part of this patient's care.  Halo Shevlin D 03/02/2023 2:55 AM

## 2023-03-02 NOTE — ED Notes (Signed)
Report called to ICU

## 2023-03-02 NOTE — Consult Note (Signed)
[facility]  CONSULT NOTE    Date: 03/02/2023                  Patient Name:  Kristen Francis  MRN: [id number]  DOB: Apr 16, 1946  Age / Sex: 77 y.o., female         PCP: Jonetta Osgood, NP                 Service Requesting Consult: Dr. Reesa Chew                 Reason for Consult: End Stage Renal Disease            History of Present Illness: Ms. Sondi Gribbon admitted to [facility] after missing her regularly scheduled hemodialysis treatment on Wednesday. Patient presents with shortness of breath and tachypnea. She was placed on BIPAP. Furosemide 120mg  IV bolus given in the ED which seems to have helped. No urine output recorded. CXR with pulmonary edema. Started on empiric antibiotics and moved to ICU.   Patient placed on emergent hemodialysis treatment. Tolerating treatment well. Ultrafiltration goal increased to 2.5 liters.   Patient denies any fevers or chills. Denies any peripheral edema. Patient denies any recent sick contacts.    Medications: Outpatient medications: Medications Prior to Admission  Medication Sig Dispense Refill Last Dose   amLODipine (NORVASC) 10 MG tablet TAKE 1 TABLET(10 MG) BY MOUTH DAILY 90 tablet 3    aspirin EC 81 MG EC tablet Take 1 tablet (81 mg total) by mouth daily. Swallow whole. 30 tablet 0    B Complex-C-Folic Acid (RENAL-VITE) 0.8 MG TABS Take 0.8 mg by mouth every evening. On dialysis days      benzonatate (TESSALON) 100 MG capsule Take 1 capsule (100 mg total) by mouth 2 (two) times daily as needed for cough. 60 capsule 3    carvedilol (COREG) 25 MG tablet Take 1 tablet (25 mg total) by mouth 2 (two) times daily with a meal. 180 tablet 3    cholecalciferol (VITAMIN D) 1000 units tablet Take 1,000 Units by mouth daily.       cinacalcet (SENSIPAR) 60 MG tablet Take 60 mg by mouth every evening.      ferrous sulfate 325 (65 FE) MG tablet Take 1 tablet (325 mg total) by mouth 2 (two) times daily with a meal. 60  tablet 2    hydrALAZINE (APRESOLINE) 25 MG tablet Take 1 tablet (25 mg total) by mouth 3 (three) times daily. For HTN 90 tablet 3    lidocaine-prilocaine (EMLA) cream APPLY SMALL AMOUNT TO ACCESS SITE (AVF) 3 TIMES A WEEK 1 HOUR BEFORE DIALYSIS. COVER WITH OCCLUSIVE DRESSING (SARAN WRAP)  6    losartan (COZAAR) 50 MG tablet Take 1 tablet (50 mg total) by mouth daily. 90 tablet 3    metoCLOPramide (REGLAN) 5 MG tablet Take 1 tablet (5 mg total) by mouth 4 (four) times daily -  before meals and at bedtime. 120 tablet 1    omeprazole (PRILOSEC) 40 MG capsule Take 1 capsule (40 mg total) by mouth daily. 90 capsule 3    rosuvastatin (CRESTOR) 5 MG tablet TAKE 1 TABLET BY MOUTH DAILY. TAKE ON DAYS NOT HAVING DIALYSIS 90 tablet 1    sennosides-docusate sodium (SENOKOT-S) 8.6-50 MG tablet Take 2 tablets by mouth daily.      sevelamer carbonate (RENVELA) 800 MG tablet Take 1,600 mg by mouth 3 (three) times daily.      VELPHORO 500 MG chewable tablet  Chew 500 mg by mouth 3 (three) times daily.       Current medications: Current Facility-Administered Medications  Medication Dose Route Frequency Provider Last Rate Last Admin   0.9 %  sodium chloride infusion  250 mL Intravenous PRN Para Skeans, MD       acetaminophen (TYLENOL) tablet 650 mg  650 mg Oral Q6H PRN Para Skeans, MD       Or   acetaminophen (TYLENOL) suppository 650 mg  650 mg Rectal Q6H PRN Para Skeans, MD       albuterol (PROVENTIL) (2.5 MG/3ML) 0.083% nebulizer solution 2.5 mg  2.5 mg Nebulization Q6H PRN Amin, Ankit Chirag, MD       amLODipine (NORVASC) tablet 10 mg  10 mg Oral Daily Para Skeans, MD       aspirin EC tablet 81 mg  81 mg Oral Daily Para Skeans, MD       carvedilol (COREG) tablet 25 mg  25 mg Oral BID WC Para Skeans, MD       ceFEPIme (MAXIPIME) 2 g in sodium chloride 0.9 % 100 mL IVPB  2 g Intravenous Q M,W,F-HD Para Skeans, MD       Chlorhexidine Gluconate Cloth 2 % PADS 6 each  6 each Topical Q0600 Para Skeans, MD   6 each at 03/02/23 0536   cinacalcet (SENSIPAR) tablet 60 mg  60 mg Oral QPM Para Skeans, MD       ferrous sulfate tablet 325 mg  325 mg Oral BID WC Para Skeans, MD   325 mg at 03/02/23 0913   heparin injection 5,000 Units  5,000 Units Subcutaneous Q12H Para Skeans, MD   5,000 Units at 03/02/23 [id number]   hydrALAZINE (APRESOLINE) injection 5 mg  5 mg Intravenous Q4H PRN Para Skeans, MD   5 mg at 03/02/23 0301   hydrALAZINE (APRESOLINE) tablet 25 mg  25 mg Oral TID Para Skeans, MD       insulin aspart (novoLOG) injection 0-5 Units  0-5 Units Subcutaneous QHS Amin, Ankit Chirag, MD       insulin aspart (novoLOG) injection 0-6 Units  0-6 Units Subcutaneous TID WC Amin, Ankit Chirag, MD       iohexol (OMNIPAQUE) 350 MG/ML injection 75 mL  75 mL Intravenous Once PRN Para Skeans, MD       losartan (COZAAR) tablet 50 mg  50 mg Oral Daily Para Skeans, MD       [START ON 03/03/2023] rosuvastatin (CRESTOR) tablet 5 mg  5 mg Oral Q T,Th,S,Su Florina Ou V, MD       sevelamer carbonate (RENVELA) tablet 1,600 mg  1,600 mg Oral TID with meals Florina Ou V, MD   1,600 mg at 03/02/23 0906   sodium chloride flush (NS) 0.9 % injection 3 mL  3 mL Intravenous Q12H Florina Ou V, MD   3 mL at 03/02/23 0205   sodium chloride flush (NS) 0.9 % injection 3 mL  3 mL Intravenous Q12H Florina Ou V, MD   3 mL at 03/02/23 0914   sodium chloride flush (NS) 0.9 % injection 3 mL  3 mL Intravenous PRN Para Skeans, MD       sucroferric oxyhydroxide [facility]) chewable tablet 500 mg  500 mg Oral TID with meals Florina Ou V, MD   500 mg at 03/02/23 0905   vancomycin (VANCOREADY) IVPB 500 mg/100 mL  500 mg Intravenous  Q M,W,F-HD Para Skeans, MD          Allergies: Allergies  Allergen Reactions   Other Other (See Comments)    Pt does not receive Blood    Penicillins Hives    Has patient had a PCN reaction causing immediate rash, facial/tongue/throat swelling, SOB or lightheadedness with hypotension:  YES Has patient had a PCN reaction causing severe rash involving mucus membranes or skin necrosis: no Has patient had a PCN reaction that required hospitalization: no Has patient had a PCN reaction occurring within the last 10 years: no If all of the above answers are "NO", then may proceed with Cephalosporin use.    Sodium Bicarbonate Itching      Past Medical History: Past Medical History:  Diagnosis Date   Anemia    Arthritis    Chronic kidney disease    ESRD   Complication of anesthesia    had procedure and felt incision early 80s   Gout    Hyperlipemia    Hypertension    Macular degeneration, right eye    Stroke [facility])      Past Surgical History: Past Surgical History:  Procedure Laterality Date   A/V FISTULAGRAM Left 12/25/2018   Procedure: A/V FISTULAGRAM;  Surgeon: Katha Cabal, MD;  Location: [place] CV LAB;  Service: Cardiovascular;  Laterality: Left;   ABDOMINAL HYSTERECTOMY     APPENDECTOMY     AV FISTULA PLACEMENT Left 07/12/2018   Procedure: INSERTION OF ARTERIOVENOUS (AV) GORE-TEX GRAFT ARM ( BRACHIAL AXILLARY );  Surgeon: Katha Cabal, MD;  Location: [facility] ORS;  Service: Vascular;  Laterality: Left;   COLONOSCOPY WITH PROPOFOL N/A 01/14/2016   Procedure: COLONOSCOPY WITH PROPOFOL;  Surgeon: Manya Silvas, MD;  Location: [facility] ENDOSCOPY;  Service: Endoscopy;  Laterality: N/A;   COLONOSCOPY WITH PROPOFOL N/A 02/14/2022   Procedure: COLONOSCOPY WITH PROPOFOL;  Surgeon: Lesly Rubenstein, MD;  Location: [facility] ENDOSCOPY;  Service: Endoscopy;  Laterality: N/A;   TONSILLECTOMY       Family History: Family History  Problem Relation Age of Onset   Kidney disease Mother    Breast cancer Neg Hx      Social History: Social History   Socioeconomic History   Marital status: Married    Spouse name: Not on file   Number of children: Not on file   Years of education: Not on file   Highest education level: Not on file  Occupational History    Not on file  Tobacco Use   Smoking status: Former   Smokeless tobacco: Never  Vaping Use   Vaping Use: Never used  Substance and Sexual Activity   Alcohol use: No   Drug use: No   Sexual activity: Not on file  Other Topics Concern   Not on file  Social History Narrative   Lives in  [place]; used to clean; no smoking; no alcohol.    Social Determinants of Health   Financial Resource Strain: Not on file  Food Insecurity: Not on file  Transportation Needs: Not on file  Physical Activity: Not on file  Stress: Not on file  Social Connections: Not on file  Intimate Partner Violence: Not on file       Vital Signs: Blood pressure (!) 168/86, pulse 85, temperature 98.1 F (36.7 C), temperature source Oral, resp. rate (!) 22, height 5\' 3"  (1.6 m), weight 55.1 kg, SpO2 91 %.  Weight trends: Filed Weights   03/02/23 0249 03/02/23 0931  Weight: 54.9  kg 55.1 kg    Physical Exam: General: NAD, sitting up in bed  Head: Normocephalic, atraumatic. Moist oral mucosal membranes  Eyes: Anicteric, PERRL  Neck: Supple, trachea midline  Lungs:  Bilateral crackles  Heart: Regular rate and rhythm  Abdomen:  Soft, nontender  Extremities:  no peripheral edema.  Neurologic: Nonfocal, moving all four extremities  Skin: No lesions  Access: Left AVF     Lab results: Basic Metabolic Panel: Recent Labs  Lab 03/01/23 2255 03/02/23 0255  NA 142 141  K 4.1 4.3  CL 98 99  CO2 23 24  GLUCOSE 121* 115*  BUN 80* 85*  CREATININE 10.38* 10.60*  CALCIUM 8.7* 8.4*    Liver Function Tests: Recent Labs  Lab 03/01/23 2255 03/02/23 0255  AST 39 29  ALT 33 29  ALKPHOS 49 49  BILITOT 0.9 0.9  PROT 7.3 6.9  ALBUMIN 3.9 3.7   No results for input(s): "LIPASE", "AMYLASE" in the last 168 hours. No results for input(s): "AMMONIA" in the last 168 hours.  CBC: Recent Labs  Lab 03/01/23 2255 03/02/23 0255  WBC 7.9 8.2  NEUTROABS 6.1  --   HGB 11.6* 11.0*  HCT 37.7 35.8*  MCV  90.2 88.4  PLT 278 245    Cardiac Enzymes: No results for input(s): "CKTOTAL", "CKMB", "CKMBINDEX", "TROPONINI" in the last 168 hours.  BNP: Invalid input(s): "POCBNP"  CBG: Recent Labs  Lab 03/02/23 0239 03/02/23 1130  GLUCAP 105* 114*    Microbiology: Results for orders placed or performed during the hospital encounter of 03/01/23  Resp panel by RT-PCR (RSV, Flu A&B, Covid) Anterior Nasal Swab     Status: None   Collection Time: 03/01/23 10:55 PM   Specimen: Anterior Nasal Swab  Result Value Ref Range Status   SARS Coronavirus 2 by RT PCR NEGATIVE NEGATIVE Final    Comment: (NOTE) SARS-CoV-2 target nucleic acids are NOT DETECTED.  The SARS-CoV-2 RNA is generally detectable in upper respiratory specimens during the acute phase of infection. The lowest concentration of SARS-CoV-2 viral copies this assay can detect is 138 copies/mL. A negative result does not preclude SARS-Cov-2 infection and should not be used as the sole basis for treatment or other patient management decisions. A negative result may occur with  improper specimen collection/handling, submission of specimen other than nasopharyngeal swab, presence of viral mutation(s) within the areas targeted by this assay, and inadequate number of viral copies(<138 copies/mL). A negative result must be combined with clinical observations, patient history, and epidemiological information. The expected result is Negative.  Fact Sheet for Patients:  EntrepreneurPulse.com.au  Fact Sheet for Healthcare Providers:  IncredibleEmployment.be  This test is no t yet approved or cleared by the [place] FDA and  has been authorized for detection and/or diagnosis of SARS-CoV-2 by FDA under an Emergency Use Authorization (EUA). This EUA will remain  in effect (meaning this test can be used) for the duration of the COVID-19 declaration under Section 564(b)(1) of the Act, 21 U.S.C.section  360bbb-3(b)(1), unless the authorization is terminated  or revoked sooner.       Influenza A by PCR NEGATIVE NEGATIVE Final   Influenza B by PCR NEGATIVE NEGATIVE Final    Comment: (NOTE) The Xpert Xpress SARS-CoV-2/FLU/RSV plus assay is intended as an aid in the diagnosis of influenza from Nasopharyngeal swab specimens and should not be used as a sole basis for treatment. Nasal washings and aspirates are unacceptable for Xpert Xpress SARS-CoV-2/FLU/RSV testing.  Fact Sheet for Patients: EntrepreneurPulse.com.au  Fact Sheet for Healthcare Providers: IncredibleEmployment.be  This test is not yet approved or cleared by the [place] FDA and has been authorized for detection and/or diagnosis of SARS-CoV-2 by FDA under an Emergency Use Authorization (EUA). This EUA will remain in effect (meaning this test can be used) for the duration of the COVID-19 declaration under Section 564(b)(1) of the Act, 21 U.S.C. section 360bbb-3(b)(1), unless the authorization is terminated or revoked.     Resp Syncytial Virus by PCR NEGATIVE NEGATIVE Final    Comment: (NOTE) Fact Sheet for Patients: EntrepreneurPulse.com.au  Fact Sheet for Healthcare Providers: IncredibleEmployment.be  This test is not yet approved or cleared by the [place] FDA and has been authorized for detection and/or diagnosis of SARS-CoV-2 by FDA under an Emergency Use Authorization (EUA). This EUA will remain in effect (meaning this test can be used) for the duration of the COVID-19 declaration under Section 564(b)(1) of the Act, 21 U.S.C. section 360bbb-3(b)(1), unless the authorization is terminated or revoked.  Performed at [facility], [address] [place], [place] [address]   Blood Culture (routine x 2)     Status: None (Preliminary result)   Collection Time: 03/01/23 11:08 PM   Specimen: BLOOD  Result Value Ref Range  Status   Specimen Description BLOOD BLOOD RIGHT ARM  Final   Special Requests   Final    BOTTLES DRAWN AEROBIC AND ANAEROBIC Blood Culture results may not be optimal due to an inadequate volume of blood received in culture bottles   Culture   Final    NO GROWTH < 12 HOURS Performed at [facility], [address] [place], [place] [address]    Report Status PENDING  Incomplete  Blood Culture (routine x 2)     Status: None (Preliminary result)   Collection Time: 03/02/23  2:56 AM   Specimen: BLOOD  Result Value Ref Range Status   Specimen Description BLOOD BLOOD RIGHT ARM  Final   Special Requests   Final    BOTTLES DRAWN AEROBIC AND ANAEROBIC Blood Culture adequate volume   Culture   Final    NO GROWTH <12 HOURS Performed at [facility], [address], [place], [place] [address]    Report Status PENDING  Incomplete    Coagulation Studies: No results for input(s): "LABPROT", "INR" in the last 72 hours.  Urinalysis: No results for input(s): "COLORURINE", "LABSPEC", "PHURINE", "GLUCOSEU", "HGBUR", "BILIRUBINUR", "KETONESUR", "PROTEINUR", "UROBILINOGEN", "NITRITE", "LEUKOCYTESUR" in the last 72 hours.  Invalid input(s): "APPERANCEUR"    Imaging: DG Chest Port 1 View  Result Date: 03/01/2023 CLINICAL DATA:  Question of sepsis to evaluate for abnormality. Weakness, chest pain, and shortness of breath for 1 week. EXAM: PORTABLE CHEST 1 VIEW COMPARISON:  08/23/2018 FINDINGS: Cardiac enlargement. Interval development of airspace consolidation throughout both lungs. Changes may represent multifocal pneumonia, edema, or ARDS. Aspiration would also be a possibility in the appropriate clinical setting. Probable small bilateral pleural effusions. No pneumothorax. Calcification of the aorta. Degenerative changes in the spine and shoulders. Vascular stent in the left axilla. IMPRESSION: Cardiac enlargement. Diffuse bilateral pulmonary infiltrates likely edema, pneumonia, or  ARDS. Electronically Signed   By: Lucienne Capers M.D.   On: 03/01/2023 22:53     Assessment & Plan: Ms. Stasi Landmark is a 77 y.o. black female with end stage renal disease on hemodialysis, hypertension, hyperlipidemia, CVA, macular degeneration, who was admitted to [facility] on 03/01/2023 for Respiratory distress [R06.03] SOB (shortness of breath) [R06.02] Pneumonia due to  infectious organism, unspecified laterality, unspecified part of lung [J18.9]  [facility] MWF [facility] Left AVF 54kg  End Stage Renal Disease: requiring emergency hemodialysis treatment on admission. Seen and examined on hemodialysis treatment. Tolerating treatment well. Challenge ultrafiltration with goal of 2.5 liters. Monitor daily for dialysis need.   Anemia of chronic kidney disease: hemoglobin 11. Winfield Rast as outpatient. Restarted iron sulfate and velphoro.  Hypertension: elevated on encounter, 170/87 during examination. Fluid driven. Home regimen of amlodipine, carvedilol, hydralazine, and losartan. Not on a diuretic at home. Consider discharging patient on a loop diuretic on nondialysis days.   Acute respiratory failure: requiring HFNC. Weaned off BIPAP. Concerning for HCAP. Empiric cefepime and vancomycin.   Secondary Hyperparathyroidism: restarted cinacalcet, velphoro and sevelamer.      LOS: 0 Mayo Owczarzak 3/29/202411:44 AM

## 2023-03-02 NOTE — Assessment & Plan Note (Signed)
We will check FT4? TSH.

## 2023-03-02 NOTE — Progress Notes (Addendum)
PROGRESS NOTE    Kristen Francis  [id number] DOB: 1946/11/29 DOA: 03/01/2023 PCP: Jonetta Osgood, NP   Brief Narrative:  77 year old with history of GERD, ESRD, HLD, HTN, CVA, arthritis admitted for shortness of breath.  Patient missed dialysis on Wednesday.  Admitted for sepsis secondary to pneumonia.   Assessment & Plan:  Principal Problem:   SOB (shortness of breath) Active Problems:   Sepsis ([place])   Essential hypertension   Multinodular goiter   Anemia   TIA (transient ischemic attack)   Type 2 diabetes mellitus with other diabetic kidney complication (HCC)     Assessment and Plan: Acute respiratory distress secondary to bilateral infiltrate Sepsis secondary to bilateral pneumonia Due to significant hypoxia patient placed on BiPAP Check BNP, procalcitonin-1.01 On empiric vancomycin and cefepime Bronchodilators, I-S/flutter valve COVID/flu-negative CTA chest ordered  but will cancel. Low suspicion from thromboembolic dz  ESRD on HD Missed HD on Wednesday. Dr Holley Raring notified. Received a of Lasix in ED.  HD on going today  Essential hypertension -Continue Coreg, losartan - IV as needed   Multinodular goiter Will order TSH/Ft4. Not ordered on admission.   Anemia Hb stable 11.0 No need for PPI. Hb is stable, no signs of bleeding.    TIA (transient ischemic attack) Cont home ASA and statin  Type 2 diabetes mellitus with other diabetic kidney complication (HCC) Will order Sliding scale and accuchecks.     DVT prophylaxis: SQ Heparin Code Status: Full  Family Communication: Spoke with husband over the phone Status is: Inpatient Continue hospital stay until her volume status is improved.  Hopefully discharge in next 24-48 hours       Diet Orders (From admission, onward)     Start     Ordered   03/02/23 0119  Diet renal with fluid restriction Fluid restriction: 1200 mL Fluid; Room service appropriate? Yes; Fluid consistency: Thin  Diet  effective now       Question Answer Comment  Fluid restriction: 1200 mL Fluid   Room service appropriate? Yes   Fluid consistency: Thin      03/02/23 0119            Subjective: Seen and examined at bedside.  She was just starting to get her dialysis.  Tells me her breathing is slightly better this morning.  Does not use any oxygen at home   Examination:  General exam: Appears calm and comfortable  Respiratory system: Bibasilar crackles Cardiovascular system: S1 & S2 heard, RRR. No JVD, murmurs, rubs, gallops or clicks. No pedal edema. Gastrointestinal system: Abdomen is nondistended, soft and nontender. No organomegaly or masses felt. Normal bowel sounds heard. Central nervous system: Alert and oriented. No focal neurological deficits. Extremities: Symmetric 5 x 5 power. Skin: No rashes, lesions or ulcers Psychiatry: Judgement and insight appear normal. Mood & affect appropriate. \  Objective: Vitals:   03/02/23 0415 03/02/23 0500 03/02/23 0609 03/02/23 0824  BP: (!) 162/75 (!) 171/87 (!) 165/85   Pulse: 77 82 80   Resp: 19 (!) 26 (!) 21   Temp:      TempSrc:      SpO2: 97% 96% 96% 92%  Weight:      Height:       No intake or output data in the 24 hours ending 03/02/23 0936 Filed Weights   03/02/23 0249  Weight: 54.9 kg    Scheduled Meds:  amLODipine  10 mg Oral Daily   aspirin EC  81 mg Oral Daily   carvedilol  25 mg Oral BID WC   Chlorhexidine Gluconate Cloth  6 each Topical Q0600   cinacalcet  60 mg Oral QPM   ferrous sulfate  325 mg Oral BID WC   heparin  5,000 Units Subcutaneous Q12H   hydrALAZINE  25 mg Oral TID   losartan  50 mg Oral Daily   [START ON 03/03/2023] rosuvastatin  5 mg Oral Q T,Th,S,Su   sevelamer carbonate  1,600 mg Oral TID with meals   sodium chloride flush  3 mL Intravenous Q12H   sodium chloride flush  3 mL Intravenous Q12H   sucroferric oxyhydroxide  500 mg Oral TID with meals   Continuous Infusions:  sodium chloride      ceFEPime (MAXIPIME) IV     vancomycin      Nutritional status     Body mass index is 21.44 kg/m.  Data Reviewed:   CBC: Recent Labs  Lab 03/01/23 2255 03/02/23 0255  WBC 7.9 8.2  NEUTROABS 6.1  --   HGB 11.6* 11.0*  HCT 37.7 35.8*  MCV 90.2 88.4  PLT 278 [id number]   Basic Metabolic Panel: Recent Labs  Lab 03/01/23 2255 03/02/23 0255  NA 142 141  K 4.1 4.3  CL 98 99  CO2 23 24  GLUCOSE 121* 115*  BUN 80* 85*  CREATININE 10.38* 10.60*  CALCIUM 8.7* 8.4*   GFR: Estimated Creatinine Clearance: 3.8 mL/min (A) (by C-G formula based on SCr of 10.6 mg/dL (H)). Liver Function Tests: Recent Labs  Lab 03/01/23 2255 03/02/23 0255  AST 39 29  ALT 33 29  ALKPHOS 49 49  BILITOT 0.9 0.9  PROT 7.3 6.9  ALBUMIN 3.9 3.7   No results for input(s): "LIPASE", "AMYLASE" in the last 168 hours. No results for input(s): "AMMONIA" in the last 168 hours. Coagulation Profile: No results for input(s): "INR", "PROTIME" in the last 168 hours. Cardiac Enzymes: No results for input(s): "CKTOTAL", "CKMB", "CKMBINDEX", "TROPONINI" in the last 168 hours. BNP (last 3 results) No results for input(s): "PROBNP" in the last 8760 hours. HbA1C: No results for input(s): "HGBA1C" in the last 72 hours. CBG: Recent Labs  Lab 03/02/23 0239  GLUCAP 105*   Lipid Profile: No results for input(s): "CHOL", "HDL", "LDLCALC", "TRIG", "CHOLHDL", "LDLDIRECT" in the last 72 hours. Thyroid Function Tests: No results for input(s): "TSH", "T4TOTAL", "FREET4", "T3FREE", "THYROIDAB" in the last 72 hours. Anemia Panel: No results for input(s): "VITAMINB12", "FOLATE", "FERRITIN", "TIBC", "IRON", "RETICCTPCT" in the last 72 hours. Sepsis Labs: Recent Labs  Lab 03/01/23 2308 03/02/23 0202 03/02/23 0255  PROCALCITON  --   --  1.01  LATICACIDVEN 1.7 0.6  --     Recent Results (from the past 240 hour(s))  Resp panel by RT-PCR (RSV, Flu A&B, Covid) Anterior Nasal Swab     Status: None   Collection Time:  03/01/23 10:55 PM   Specimen: Anterior Nasal Swab  Result Value Ref Range Status   SARS Coronavirus 2 by RT PCR NEGATIVE NEGATIVE Final    Comment: (NOTE) SARS-CoV-2 target nucleic acids are NOT DETECTED.  The SARS-CoV-2 RNA is generally detectable in upper respiratory specimens during the acute phase of infection. The lowest concentration of SARS-CoV-2 viral copies this assay can detect is 138 copies/mL. A negative result does not preclude SARS-Cov-2 infection and should not be used as the sole basis for treatment or other patient management decisions. A negative result may occur with  improper specimen collection/handling, submission of specimen other than nasopharyngeal swab, presence of viral  mutation(s) within the areas targeted by this assay, and inadequate number of viral copies(<138 copies/mL). A negative result must be combined with clinical observations, patient history, and epidemiological information. The expected result is Negative.  Fact Sheet for Patients:  EntrepreneurPulse.com.au  Fact Sheet for Healthcare Providers:  IncredibleEmployment.be  This test is no t yet approved or cleared by the [place] FDA and  has been authorized for detection and/or diagnosis of SARS-CoV-2 by FDA under an Emergency Use Authorization (EUA). This EUA will remain  in effect (meaning this test can be used) for the duration of the COVID-19 declaration under Section 564(b)(1) of the Act, 21 U.S.C.section 360bbb-3(b)(1), unless the authorization is terminated  or revoked sooner.       Influenza A by PCR NEGATIVE NEGATIVE Final   Influenza B by PCR NEGATIVE NEGATIVE Final    Comment: (NOTE) The Xpert Xpress SARS-CoV-2/FLU/RSV plus assay is intended as an aid in the diagnosis of influenza from Nasopharyngeal swab specimens and should not be used as a sole basis for treatment. Nasal washings and aspirates are unacceptable for Xpert Xpress  SARS-CoV-2/FLU/RSV testing.  Fact Sheet for Patients: EntrepreneurPulse.com.au  Fact Sheet for Healthcare Providers: IncredibleEmployment.be  This test is not yet approved or cleared by the [place] FDA and has been authorized for detection and/or diagnosis of SARS-CoV-2 by FDA under an Emergency Use Authorization (EUA). This EUA will remain in effect (meaning this test can be used) for the duration of the COVID-19 declaration under Section 564(b)(1) of the Act, 21 U.S.C. section 360bbb-3(b)(1), unless the authorization is terminated or revoked.     Resp Syncytial Virus by PCR NEGATIVE NEGATIVE Final    Comment: (NOTE) Fact Sheet for Patients: EntrepreneurPulse.com.au  Fact Sheet for Healthcare Providers: IncredibleEmployment.be  This test is not yet approved or cleared by the [place] FDA and has been authorized for detection and/or diagnosis of SARS-CoV-2 by FDA under an Emergency Use Authorization (EUA). This EUA will remain in effect (meaning this test can be used) for the duration of the COVID-19 declaration under Section 564(b)(1) of the Act, 21 U.S.C. section 360bbb-3(b)(1), unless the authorization is terminated or revoked.  Performed at [facility], [address] [place], [place] [address]   Blood Culture (routine x 2)     Status: None (Preliminary result)   Collection Time: 03/01/23 11:08 PM   Specimen: BLOOD  Result Value Ref Range Status   Specimen Description BLOOD BLOOD RIGHT ARM  Final   Special Requests   Final    BOTTLES DRAWN AEROBIC AND ANAEROBIC Blood Culture results may not be optimal due to an inadequate volume of blood received in culture bottles   Culture   Final    NO GROWTH < 12 HOURS Performed at [facility], [address] [place], [place] [address]    Report Status PENDING  Incomplete  Blood Culture (routine x 2)     Status: None  (Preliminary result)   Collection Time: 03/02/23  2:56 AM   Specimen: BLOOD  Result Value Ref Range Status   Specimen Description BLOOD BLOOD RIGHT ARM  Final   Special Requests   Final    BOTTLES DRAWN AEROBIC AND ANAEROBIC Blood Culture adequate volume   Culture   Final    NO GROWTH <12 HOURS Performed at [facility], [address], [place],  [address]    Report Status PENDING  Incomplete         Radiology Studies: DG Chest Port 1  View  Result Date: 03/01/2023 CLINICAL DATA:  Question of sepsis to evaluate for abnormality. Weakness, chest pain, and shortness of breath for 1 week. EXAM: PORTABLE CHEST 1 VIEW COMPARISON:  08/23/2018 FINDINGS: Cardiac enlargement. Interval development of airspace consolidation throughout both lungs. Changes may represent multifocal pneumonia, edema, or ARDS. Aspiration would also be a possibility in the appropriate clinical setting. Probable small bilateral pleural effusions. No pneumothorax. Calcification of the aorta. Degenerative changes in the spine and shoulders. Vascular stent in the left axilla. IMPRESSION: Cardiac enlargement. Diffuse bilateral pulmonary infiltrates likely edema, pneumonia, or ARDS. Electronically Signed   By: Lucienne Capers M.D.   On: 03/01/2023 22:53           LOS: 0 days   Time spent= 35 mins    Oksana Deberry Arsenio Loader, MD [facility]  If 7PM-7AM, please contact night-coverage  03/02/2023, 9:36 AM

## 2023-03-02 NOTE — Hospital Course (Signed)
Admission for 3  days SOB  missed her HD Wednesday.  Nephrology: Dr. Holley Raring. Fever  Concerning for Pneumonia/ ARDs.  BS IV ABX  IV lasix 120 mg.

## 2023-03-02 NOTE — Assessment & Plan Note (Signed)
    Latest Ref Rng & Units 03/01/2023   10:55 PM 06/04/2021    7:39 AM 06/03/2021    2:07 PM  CBC  WBC 4.0 - 10.5 K/uL 7.9  5.8  7.5   Hemoglobin 12.0 - 15.0 g/dL 11.6  6.1  6.6   Hematocrit 36.0 - 46.0 % 37.7  19.1  20.3   Platelets 150 - 400 K/uL 278  197  204   Type/ screen. IV PPI.

## 2023-03-02 NOTE — Progress Notes (Signed)
Patient trialed off of Bipap at this time, placed on 5L [place]. Tolerating well. RN made aware.

## 2023-03-02 NOTE — Progress Notes (Signed)
3.5 hours Hd tolerated well with 2.5 l uf.post Hd report given to shift RN  03/02/23 1441  Vitals  Temp 98.2 F (36.8 C)  Temp Source Oral  BP (!) 169/75  BP Location Right Arm  BP Method Automatic  Patient Position (if appropriate) Lying  Pulse Rate 85  Pulse Rate Source Monitor  Resp (!) 24  Oxygen Therapy  SpO2 100 %  O2 Device Nasal Cannula  O2 Flow Rate (L/min) 9 L/min  Patient Activity (if Appropriate) In bed  Pulse Oximetry Type Continuous  Oximetry Probe Site Changed No  Post Treatment  Dialyzer Clearance Lightly streaked  Duration of HD Treatment -hour(s) 3.5 hour(s)  Liters Processed 74.1  Fluid Removed (mL) 2500 mL  Tolerated HD Treatment Yes  Post-Hemodialysis Comments tolerated HDbwell, some cramping towards the end of HD,  AVG/AVF Arterial Site Held (minutes) 8 minutes  AVG/AVF Venous Site Held (minutes) 8 minutes

## 2023-03-02 NOTE — Progress Notes (Signed)
Pharmacy Antibiotic Note  Kristen Francis is a 77 y.o. female admitted on 03/01/2023 with PNA, on HD Q MWF.  Pharmacy has been consulted for Vancomycin , Cefepime dosing. Of note, patient has ESRD, on dialysis MWF.  Day 1 of antibiotics. MRSA PCR has been ordered.  Plan: 1) Cefepime 2 gm IV Q MWF(on HD days)  2) Vancomycin 500 mg IV Q MWF(on HD days)   Height: 5\' 3"  (160 cm) Weight: 55.1 kg (121 lb 7.6 oz) IBW/kg (Calculated) : 52.4  Temp (24hrs), Avg:98.1 F (36.7 C), Min:96.5 F (35.8 C), Max:101.2 F (38.4 C)  Recent Labs  Lab 03/01/23 2255 03/01/23 2308 03/02/23 0202 03/02/23 0255  WBC 7.9  --   --  8.2  CREATININE 10.38*  --   --  10.60*  LATICACIDVEN  --  1.7 0.6  --      Estimated Creatinine Clearance: 3.8 mL/min (A) (by C-G formula based on SCr of 10.6 mg/dL (H)).    Allergies  Allergen Reactions   Other Other (See Comments)    Pt does not receive Blood    Penicillins Hives    Has patient had a PCN reaction causing immediate rash, facial/tongue/throat swelling, SOB or lightheadedness with hypotension: YES Has patient had a PCN reaction causing severe rash involving mucus membranes or skin necrosis: no Has patient had a PCN reaction that required hospitalization: no Has patient had a PCN reaction occurring within the last 10 years: no If all of the above answers are "NO", then may proceed with Cephalosporin use.    Sodium Bicarbonate Itching    Antimicrobials this admission:  Vancomycin 3/29 >>   Cefepime 3/29 >>   Dose adjustments this admission:   Microbiology results:  BCx: NGTD  MRSA PCR: pending  Thank you for allowing pharmacy to be a part of this patient's care.  Jennel Mara A Val Farnam 03/02/2023 1:11 PM

## 2023-03-02 NOTE — H&P (Addendum)
History and Physical     Patient: Kristen Francis [id number] DOB: 1946/07/25 DOA: 03/01/2023 DOS: the patient was seen and examined on 03/02/2023 PCP: Jonetta Osgood, NP   Patient coming from: Home  Chief Complaint: Shortness of breath  HISTORY OF PRESENT ILLNESS: Kristen Francis is an 77 y.o. female brought by EMS from home for shortness of breath.  HPI is limited secondary to the acuity of her condition and initial presentation of tachypnea and hypoxia.  Patient does report being short of breath for few days and feeling hot.  Patient does have a history of end-stage renal disease and has missed her dialysis on Wednesday.  Initial presentation patient meets sepsis criteria with respiratory failure fever tachypnea.  Suspected source of infection possible pneumonia.  Patient is alert awake but tachypneic.  Past Medical History:  Diagnosis Date   Anemia    Arthritis    Chronic kidney disease    ESRD   Complication of anesthesia    had procedure and felt incision early 80s   Gout    Hyperlipemia    Hypertension    Macular degeneration, right eye    Stroke ([place])    Review of Systems  Constitutional:  Positive for fever.  Respiratory:  Positive for shortness of breath.   Neurological:  Positive for weakness.  All other systems reviewed and are negative.  Allergies  Allergen Reactions   Other Other (See Comments)    Pt does not receive Blood    Penicillins Hives    Has patient had a PCN reaction causing immediate rash, facial/tongue/throat swelling, SOB or lightheadedness with hypotension: YES Has patient had a PCN reaction causing severe rash involving mucus membranes or skin necrosis: no Has patient had a PCN reaction that required hospitalization: no Has patient had a PCN reaction occurring within the last 10 years: no If all of the above answers are "NO", then may proceed with Cephalosporin use.    Sodium Bicarbonate Itching   Past Surgical History:   Procedure Laterality Date   A/V FISTULAGRAM Left 12/25/2018   Procedure: A/V FISTULAGRAM;  Surgeon: Katha Cabal, MD;  Location: [facility];  Service: Cardiovascular;  Laterality: Left;   ABDOMINAL HYSTERECTOMY     APPENDECTOMY     AV FISTULA PLACEMENT Left 07/12/2018   Procedure: INSERTION OF ARTERIOVENOUS (AV) GORE-TEX GRAFT ARM ( BRACHIAL AXILLARY );  Surgeon: Katha Cabal, MD;  Location: [facility];  Service: Vascular;  Laterality: Left;   COLONOSCOPY WITH PROPOFOL N/A 01/14/2016   Procedure: COLONOSCOPY WITH PROPOFOL;  Surgeon: Manya Silvas, MD;  Location: [facility];  Service: Endoscopy;  Laterality: N/A;   COLONOSCOPY WITH PROPOFOL N/A 02/14/2022   Procedure: COLONOSCOPY WITH PROPOFOL;  Surgeon: Lesly Rubenstein, MD;  Location: [facility] ENDOSCOPY;  Service: Endoscopy;  Laterality: N/A;   TONSILLECTOMY      MEDICATIONS: Prior to Admission medications   Medication Sig Start Date End Date Taking? Authorizing Provider  amLODipine (NORVASC) 10 MG tablet TAKE 1 TABLET(10 MG) BY MOUTH DAILY 06/09/22   Jonetta Osgood, NP  aspirin EC 81 MG EC tablet Take 1 tablet (81 mg total) by mouth daily. Swallow whole. 06/05/21   Fritzi Mandes, MD  B Complex-C-Folic Acid (RENAL-VITE) 0.8 MG TABS Take 0.8 mg by mouth every evening. On dialysis days 11/22/18   [provider]  benzonatate (TESSALON) 100 MG capsule Take 1 capsule (100 mg total) by mouth 2 (two) times daily as needed for cough. 12/11/22   Abernathy,  Alyssa, NP  carvedilol (COREG) 25 MG tablet Take 1 tablet (25 mg total) by mouth 2 (two) times daily with a meal. 04/12/22   Abernathy, Yetta Flock, NP  cholecalciferol (VITAMIN D) 1000 units tablet Take 1,000 Units by mouth daily.     [provider]  cinacalcet (SENSIPAR) 60 MG tablet Take 60 mg by mouth every evening.    [provider]  ferrous sulfate 325 (65 FE) MG tablet Take 1 tablet (325 mg total) by mouth 2 (two) times daily with a meal. 06/04/21    Fritzi Mandes, MD  hydrALAZINE (APRESOLINE) 25 MG tablet Take 1 tablet (25 mg total) by mouth 3 (three) times daily. For HTN 12/11/22   Jonetta Osgood, NP  lidocaine-prilocaine (EMLA) cream APPLY SMALL AMOUNT TO ACCESS SITE (AVF) 3 TIMES A WEEK 1 HOUR BEFORE DIALYSIS. COVER WITH OCCLUSIVE DRESSING (SARAN WRAP) 09/30/18   [provider]  losartan (COZAAR) 50 MG tablet Take 1 tablet (50 mg total) by mouth daily. 08/09/22   Jonetta Osgood, NP  metoCLOPramide (REGLAN) 5 MG tablet Take 1 tablet (5 mg total) by mouth 4 (four) times daily -  before meals and at bedtime. 09/06/22   Jonetta Osgood, NP  omeprazole (PRILOSEC) 40 MG capsule Take 1 capsule (40 mg total) by mouth daily. 04/12/22   Jonetta Osgood, NP  rosuvastatin (CRESTOR) 5 MG tablet TAKE 1 TABLET BY MOUTH DAILY. TAKE ON DAYS NOT HAVING DIALYSIS 04/30/22   Jonetta Osgood, NP  sennosides-docusate sodium (SENOKOT-S) 8.6-50 MG tablet Take 2 tablets by mouth daily.    [provider]  sevelamer carbonate (RENVELA) 800 MG tablet Take 1,600 mg by mouth 3 (three) times daily. 03/24/22   [provider]  VELPHORO 500 MG chewable tablet Chew 500 mg by mouth 3 (three) times daily. 12/20/21   [provider]  mirtazapine (REMERON) 7.5 MG tablet Take 1 tablet (7.5 mg total) by mouth at bedtime. Patient not taking: No sig reported 07/01/20 06/04/21  Ronnell Freshwater, NP  promethazine (PHENERGAN) 12.5 MG tablet Take 1 tablet (12.5 mg total) by mouth every 8 (eight) hours as needed for nausea or vomiting. Patient not taking: No sig reported 12/16/20 06/04/21  Lavera Guise, MD    albuterol  2.5 mg Nebulization Q6H   amLODipine  10 mg Oral Daily   aspirin EC  81 mg Oral Daily   carvedilol  25 mg Oral BID WC   Chlorhexidine Gluconate Cloth  6 each Topical Q0600   cinacalcet  60 mg Oral QPM   ferrous sulfate  325 mg Oral BID WC   heparin  5,000 Units Subcutaneous Q12H   hydrALAZINE  25 mg Oral TID   losartan  50 mg Oral  Daily   [START ON 03/03/2023] rosuvastatin  5 mg Oral Q T,Th,S,Su   sevelamer carbonate  1,600 mg Oral TID with meals   sodium chloride flush  3 mL Intravenous Q12H   sodium chloride flush  3 mL Intravenous Q12H   sucroferric oxyhydroxide  500 mg Oral TID with meals     sodium chloride     furosemide      ED Course: Pt in Ed is alert awake oriented meet sepsis criteria.  Patient is given vancomycin and cefepime for sepsis. Vitals:   03/01/23 2345 03/02/23 0113 03/02/23 0134 03/02/23 0200  BP: (!) 160/77   (!) 177/84  Pulse: 76   76  Resp: (!) 24   (!) 23  Temp:  (!) 96.5 F (35.8 C)  TempSrc:  Rectal    SpO2: 98%  96% 98%   No intake/output data recorded. SpO2: 98 % FiO2 (%): (S) 30 % Blood work in ed shows: Initial EKG shows sinus rhythm at 81 with no ST or T wave changes, QT interval 511. Blood cultures sent urine collected in the emergency room. Chest x-ray shows diffuse bilateral pulmonary infiltrates consistent with volume overload or possible pneumonia or arms. Patient given Zofran and lorazepam in the emergency room. CMP shows glucose of 121 creatinine of 10.38 normal LFTs and normal electrolytes otherwise. Lactic acid of 1.7. CBC shows hemoglobin of 11.6 otherwise normal. Respiratory panel negative for flu COVID and RSV.  Results for orders placed or performed during the hospital encounter of 03/01/23 (from the past 72 hour(s))  CBC with Differential     Status: Abnormal   Collection Time: 03/01/23 10:55 PM  Result Value Ref Range   WBC 7.9 4.0 - 10.5 K/uL   RBC 4.18 3.87 - 5.11 MIL/uL   Hemoglobin 11.6 (L) 12.0 - 15.0 g/dL   HCT 37.7 36.0 - 46.0 %   MCV 90.2 80.0 - 100.0 fL   MCH 27.8 26.0 - 34.0 pg   MCHC 30.8 30.0 - 36.0 g/dL   RDW 19.3 (H) 11.5 - 15.5 %   Platelets 278 150 - 400 K/uL   nRBC 0.0 0.0 - 0.2 %   Neutrophils Relative % 78 %   Neutro Abs 6.1 1.7 - 7.7 K/uL   Lymphocytes Relative 15 %   Lymphs Abs 1.2 0.7 - 4.0 K/uL   Monocytes Relative 5 %    Monocytes Absolute 0.4 0.1 - 1.0 K/uL   Eosinophils Relative 1 %   Eosinophils Absolute 0.1 0.0 - 0.5 K/uL   Basophils Relative 1 %   Basophils Absolute 0.1 0.0 - 0.1 K/uL   Immature Granulocytes 0 %   Abs Immature Granulocytes 0.03 0.00 - 0.07 K/uL    Comment: Performed at [facility], [address] [place], [place] [address]  Resp panel by RT-PCR (RSV, Flu A&B, Covid) Anterior Nasal Swab     Status: None   Collection Time: 03/01/23 10:55 PM   Specimen: Anterior Nasal Swab  Result Value Ref Range   SARS Coronavirus 2 by RT PCR NEGATIVE NEGATIVE    Comment: (NOTE) SARS-CoV-2 target nucleic acids are NOT DETECTED.  The SARS-CoV-2 RNA is generally detectable in upper respiratory specimens during the acute phase of infection. The lowest concentration of SARS-CoV-2 viral copies this assay can detect is 138 copies/mL. A negative result does not preclude SARS-Cov-2 infection and should not be used as the sole basis for treatment or other patient management decisions. A negative result may occur with  improper specimen collection/handling, submission of specimen other than nasopharyngeal swab, presence of viral mutation(s) within the areas targeted by this assay, and inadequate number of viral copies(<138 copies/mL). A negative result must be combined with clinical observations, patient history, and epidemiological information. The expected result is Negative.  Fact Sheet for Patients:  EntrepreneurPulse.com.au  Fact Sheet for Healthcare Providers:  IncredibleEmployment.be  This test is no t yet approved or cleared by the [place] FDA and  has been authorized for detection and/or diagnosis of SARS-CoV-2 by FDA under an Emergency Use Authorization (EUA). This EUA will remain  in effect (meaning this test can be used) for the duration of the COVID-19 declaration under Section 564(b)(1) of the Act, 21 U.S.C.section 360bbb-3(b)(1),  unless the authorization is terminated  or revoked sooner.  Influenza A by PCR NEGATIVE NEGATIVE   Influenza B by PCR NEGATIVE NEGATIVE    Comment: (NOTE) The Xpert Xpress SARS-CoV-2/FLU/RSV plus assay is intended as an aid in the diagnosis of influenza from Nasopharyngeal swab specimens and should not be used as a sole basis for treatment. Nasal washings and aspirates are unacceptable for Xpert Xpress SARS-CoV-2/FLU/RSV testing.  Fact Sheet for Patients: EntrepreneurPulse.com.au  Fact Sheet for Healthcare Providers: IncredibleEmployment.be  This test is not yet approved or cleared by the [place] FDA and has been authorized for detection and/or diagnosis of SARS-CoV-2 by FDA under an Emergency Use Authorization (EUA). This EUA will remain in effect (meaning this test can be used) for the duration of the COVID-19 declaration under Section 564(b)(1) of the Act, 21 U.S.C. section 360bbb-3(b)(1), unless the authorization is terminated or revoked.     Resp Syncytial Virus by PCR NEGATIVE NEGATIVE    Comment: (NOTE) Fact Sheet for Patients: EntrepreneurPulse.com.au  Fact Sheet for Healthcare Providers: IncredibleEmployment.be  This test is not yet approved or cleared by the [place] FDA and has been authorized for detection and/or diagnosis of SARS-CoV-2 by FDA under an Emergency Use Authorization (EUA). This EUA will remain in effect (meaning this test can be used) for the duration of the COVID-19 declaration under Section 564(b)(1) of the Act, 21 U.S.C. section 360bbb-3(b)(1), unless the authorization is terminated or revoked.  Performed at [facility], [address] [place], [place] [address]   Comprehensive metabolic panel     Status: Abnormal   Collection Time: 03/01/23 10:55 PM  Result Value Ref Range   Sodium 142 135 - 145 mmol/L   Potassium 4.1 3.5 - 5.1 mmol/L    Chloride 98 98 - 111 mmol/L   CO2 23 22 - 32 mmol/L   Glucose, Bld 121 (H) 70 - 99 mg/dL    Comment: Glucose reference range applies only to samples taken after fasting for at least 8 hours.   BUN 80 (H) 8 - 23 mg/dL   Creatinine, Ser 10.38 (H) 0.44 - 1.00 mg/dL   Calcium 8.7 (L) 8.9 - 10.3 mg/dL   Total Protein 7.3 6.5 - 8.1 g/dL   Albumin 3.9 3.5 - 5.0 g/dL   AST 39 15 - 41 U/L   ALT 33 0 - 44 U/L   Alkaline Phosphatase 49 38 - 126 U/L   Total Bilirubin 0.9 0.3 - 1.2 mg/dL   GFR, Estimated 4 (L) >60 mL/min    Comment: (NOTE) Calculated using the CKD-EPI Creatinine Equation (2021)    Anion gap 21 (H) 5 - 15    Comment: Performed at [facility], [address] [place], [place] [address]  Lactic acid, plasma     Status: None   Collection Time: 03/01/23 11:08 PM  Result Value Ref Range   Lactic Acid, Venous 1.7 0.5 - 1.9 mmol/L    Comment: Performed at [facility], [address], [place], [place] [address]    Lab Results  Component Value Date   CREATININE 10.38 (H) 03/01/2023   CREATININE 5.64 (H) 06/04/2021   CREATININE 3.71 (H) 06/03/2021      Latest Ref Rng & Units 03/01/2023   10:55 PM 06/04/2021    7:39 AM 06/03/2021    2:07 PM  CMP  Glucose 70 - 99 mg/dL 121   136   BUN 8 - 23 mg/dL 80   21   Creatinine 0.44 - 1.00 mg/dL 10.38  5.64  3.71  Sodium 135 - 145 mmol/L 142   139   Potassium 3.5 - 5.1 mmol/L 4.1   3.4   Chloride 98 - 111 mmol/L 98   100   CO2 22 - 32 mmol/L 23   32   Calcium 8.9 - 10.3 mg/dL 8.7   8.4   Total Protein 6.5 - 8.1 g/dL 7.3   6.9   Total Bilirubin 0.3 - 1.2 mg/dL 0.9   0.5   Alkaline Phos 38 - 126 U/L 49   46   AST 15 - 41 U/L 39   19   ALT 0 - 44 U/L 33   12     Unresulted Labs (From admission, onward)     Start     Ordered   03/02/23 0500  CBC  Tomorrow morning,   R        03/02/23 0119   03/02/23 0500  Comprehensive metabolic panel  Once,   R        03/02/23 0500   03/02/23 0017  Hepatitis B surface antigen   (New Admission Hemo Labs (Hepatitis B))  Once,   URGENT        03/02/23 0018   03/02/23 0017  Hepatitis B surface antibody,quantitative  (New Admission Hemo Labs (Hepatitis B))  Once,   URGENT        03/02/23 0018   03/01/23 2229  Lactic acid, plasma  (Undifferentiated presentation (screening labs and basic nursing orders))  Now then every 2 hours,   STAT      03/01/23 2229   03/01/23 2229  Blood Culture (routine x 2)  (Undifferentiated presentation (screening labs and basic nursing orders))  BLOOD CULTURE X 2,   STAT      03/01/23 2229   03/01/23 2229  Urinalysis, w/ Reflex to Culture (Infection Suspected) -Urine, Clean Catch  (Undifferentiated presentation (screening labs and basic nursing orders))  Once,   URGENT       Question:  Specimen Source  Answer:  Urine, Clean Catch   03/01/23 2229   Signed and Held  Renal function panel  Once,   R        Signed and Held   Signed and Held  CBC  Once,   R        Signed and Held           Pt has received : Orders Placed This Encounter  Procedures   Blood Culture (routine x 2)    Standing Status:   Standing    Number of Occurrences:   2   Resp panel by RT-PCR (RSV, Flu A&B, Covid) Anterior Nasal Swab    Standing Status:   Standing    Number of Occurrences:   1   DG Chest Port 1 View    Standing Status:   Standing    Number of Occurrences:   1    Order Specific Question:   Reason for Exam (SYMPTOM  OR DIAGNOSIS REQUIRED)    Answer:   Questionable sepsis - evaluate for abnormality   Lactic acid, plasma    Standing Status:   Standing    Number of Occurrences:   2   CBC with Differential    Standing Status:   Standing    Number of Occurrences:   1   Urinalysis, w/ Reflex to Culture (Infection Suspected) -Urine, Clean Catch    Standing Status:   Standing    Number of Occurrences:   1    Order Specific  Question:   Specimen Source    Answer:   Urine, Clean Catch [76]   Comprehensive metabolic panel    Standing Status:   Standing     Number of Occurrences:   1   Hepatitis B surface antigen    Standing Status:   Standing    Number of Occurrences:   1   Hepatitis B surface antibody,quantitative    Standing Status:   Standing    Number of Occurrences:   1   CBC    Standing Status:   Standing    Number of Occurrences:   1   Comprehensive metabolic panel    Standing Status:   Standing    Number of Occurrences:   1   Diet renal with fluid restriction Fluid restriction: 1200 mL Fluid; Room service appropriate? Yes; Fluid consistency: Thin    Standing Status:   Standing    Number of Occurrences:   1    Order Specific Question:   Fluid restriction:    Answer:   1200 mL Fluid    Order Specific Question:   Room service appropriate?    Answer:   Yes    Order Specific Question:   Fluid consistency:    Answer:   Thin   Cardiac monitoring    Standing Status:   Standing    Number of Occurrences:   1   Document height and weight    Standing Status:   Standing    Number of Occurrences:   1   Initiate Carrier Fluid Protocol    Standing Status:   Standing    Number of Occurrences:   1   Informed Consent Details: Physician/Practitioner Attestation; Transcribe to consent form and obtain patient signature    [place].    Standing Status:   Standing    Number of Occurrences:   1    Order Specific Question:   Physician/Practitioner attestation of informed consent for procedure/surgical case    Answer:   I, the physician/practitioner, attest that I have discussed with the patient the benefits, risks, side effects, alternatives, likelihood of achieving goals and potential problems during recovery for the procedure that I have provided informed consent.    Order Specific Question:   Procedure    Answer:   Hemodialysis    Order Specific Question:   Physician/Practitioner performing the procedure    Answer:   Dr. Holley Raring    Order Specific Question:   Indication/Reason    Answer:   End Stage  Kidney Disease   Pre-Hemodialysis Protocol - Day of Dialysis    1.  Hold antihypertensive. 2.  Do NOT hold midodrine 3.  Hold BID antibiotics scheduled within 1 hour of HD start time and administer dose within one hour post-HD.  Give subsequent dose as scheduled unless next dose is due within 6 hours.  Call Pharmacy to reschedule. 4.  Administer TID antibiotics as scheduled.  If does missed due to HD, administer dose within one hour post-HD and call Pharmacy to adjust subsequent doses as indicated. 5.  Administer oral diabetic medications with food. 6.  Administer all intermediate and long-acting insulin as scheduled regardless of treatment schedule. 7.  Administer rapid-acting insulin/sliding scale per Glycemic Control Order Set. 8.  During HD:  Hold scheduled CBGs and SSI. 9. Weigh patient daily    Standing Status:   Standing    Number of Occurrences:   1   Post-Dialysis Protocol - Day of Dialysis  1.  Administer once daily medications including renal vitamins. 2.  Administer once daily antibiotics. 3.  For diabetic patients, obtain a CBG when patient returns from HD and follow these parameters:      A)  If CBG < 70 mg/dl, initiate Hypoglycemia Protocol      B)  If CBG 70-300, do not give SSI.  Restart SSI with next scheduled CBG check.      C)  If CBG >300, and it is greater than 2 hours before next scheduled CBG, give half of ordered dose.      D)  If CBG >300, and it is less than 2 hours before next scheduled CBG, call MD for  insulin orders. 4. Weigh patient post-dialysis    Standing Status:   Standing    Number of Occurrences:   1   Maintain IV access    Standing Status:   Standing    Number of Occurrences:   1   Vital signs    Standing Status:   Standing    Number of Occurrences:   1   Notify physician (specify)    Standing Status:   Standing    Number of Occurrences:   20    Order Specific Question:   Notify Physician    Answer:   for pulse less than 55 or greater than  120    Order Specific Question:   Notify Physician    Answer:   for respiratory rate less than 12 or greater than 25    Order Specific Question:   Notify Physician    Answer:   for temperature greater than 100.5 F    Order Specific Question:   Notify Physician    Answer:   for urinary output less than 30 mL/hr for four hours    Order Specific Question:   Notify Physician    Answer:   for systolic BP less than 90 or greater than 0000000, diastolic BP less than 60 or greater than 100    Order Specific Question:   Notify Physician    Answer:   for new hypoxia w/ oxygen saturations < 88%   Progressive Mobility Protocol: No Restrictions    Standing Status:   Standing    Number of Occurrences:   1   Daily weights    Standing Status:   Standing    Number of Occurrences:   1   Intake and Output    Standing Status:   Standing    Number of Occurrences:   1   Initiate Oral Care Protocol    Standing Status:   Standing    Number of Occurrences:   1   Initiate Carrier Fluid Protocol    Standing Status:   Standing    Number of Occurrences:   1   RN may order General Admission PRN Orders utilizing "General Admission PRN medications" (through manage orders) for the following patient needs: allergy symptoms (Claritin), cold sores (Carmex), cough (Robitussin DM), eye irritation (Liquifilm Tears), hemorrhoids (Tucks), indigestion (Maalox), minor skin irritation (Hydrocortisone Cream), muscle pain (Ben Gay), nose irritation (saline nasal spray) and sore throat (Chloraseptic spray).    Standing Status:   Standing    Number of Occurrences:   [phone]   Bladder scan    To check for urine retention, call if > 252ml.    Standing Status:   Standing    Number of Occurrences:   1   Check for fecal impaction; check bowel movement record; assess for  pain.    Standing Status:   Standing    Number of Occurrences:   1   Full code    Standing Status:   Standing    Number of Occurrences:   1    Order Specific Question:    By:    Answer:   Other   Consult to hospitalist    Standing Status:   Standing    Number of Occurrences:   1    Order Specific Question:   Place call to:    Answer:   hospitalist    Order Specific Question:   Reason for Consult    Answer:   Admit    Order Specific Question:   Diagnosis/Clinical Info for Consult:    Answer:   pneumonia/pulm edema   Consult to nephrology Consult Timeframe: ROUTINE - requires response within 24 hours; Reason for Consult? ESRD on HD.    Standing Status:   Standing    Number of Occurrences:   1    Order Specific Question:   Consult Timeframe    Answer:   ROUTINE - requires response within 24 hours    Order Specific Question:   Reason for Consult?    Answer:   ESRD on HD.   CeFEPIme (MAXIPIME) per pharmacy consult             Standing Status:   Standing    Number of Occurrences:   1    Order Specific Question:   Antibiotic Indication:    Answer:   Sepsis   vancomycin per pharmacy consult    Standing Status:   Standing    Number of Occurrences:   1    Order Specific Question:   Indication:    Answer:   Pneumonia   Pulse oximetry, continuous    Standing Status:   Standing    Number of Occurrences:   1   Bipap    Standing Status:   Standing    Number of Occurrences:   1   Pulse oximetry check with vital signs    Standing Status:   Standing    Number of Occurrences:   1   Oxygen therapy Mode or (Route): Nasal cannula; Liters Per Minute: 2; Keep 02 saturation: greater than 92 %    Standing Status:   Standing    Number of Occurrences:   20    Order Specific Question:   Mode or (Route)    Answer:   Nasal cannula    Order Specific Question:   Liters Per Minute    Answer:   2    Order Specific Question:   Keep 02 saturation    Answer:   greater than 92 %   ED EKG 12-Lead    Standing Status:   Standing    Number of Occurrences:   1    Order Specific Question:   Notes    Answer:   Baseline   EKG 12-Lead    Standing Status:   Standing    Number of  Occurrences:   1   Hemodialysis inpatient    Standing Status:   Standing    Number of Occurrences:   1    Order Specific Question:   Mode    Answer:   HD    Order Specific Question:   K+    Answer:   2 meq    Order Specific Question:   Ca++    Answer:   2.5 meq    Order  Specific Question:   Na+    Answer:   138 meq    Order Specific Question:   HCO3- (Bicarb)    Answer:   97    Order Specific Question:   Dialyzer    Answer:   Revaclear 300    Order Specific Question:   Dialysate Temperature (C)    Answer:   37    Order Specific Question:   BFR-As tolerated to a maximum of:    Answer:   400 mL/min    Order Specific Question:   DFR    Answer:   300 ml/min    Order Specific Question:   Duration of Treatment    Answer:   3.5 Hours    Order Specific Question:   Dry weight (kg) or fluid removal (L) As Tolerated    Answer:   2.5    Order Specific Question:   Access Site    Answer:   AVF    Order Specific Question:   Needle gauge    Answer:   51 G    Order Specific Question:   Needle Length    Answer:   1 in.    Order Specific Question:   HD Standing Orders    Answer:   Yes   Admit to Inpatient (patient's expected length of stay will be greater than 2 midnights or inpatient only procedure)    Standing Status:   Standing    Number of Occurrences:   1    Order Specific Question:   Hospital Area    Answer:   [place] [100120]    Order Specific Question:   Level of Care    Answer:   Stepdown [14]    Order Specific Question:   Covid Evaluation    Answer:   Asymptomatic - no recent exposure (last 10 days) testing not required    Order Specific Question:   Diagnosis    Answer:   SOB (shortness of breath) [id number]    Order Specific Question:   Admitting Physician    Answer:   Cherylann Ratel    Order Specific Question:   Attending Physician    Answer:   Cherylann Ratel    Order Specific Question:   Certification:    Answer:   I certify this  patient will need inpatient services for at least 2 midnights    Order Specific Question:   Estimated Length of Stay    Answer:   2   Aspiration precautions    Standing Status:   Standing    Number of Occurrences:   1   Fall precautions    Standing Status:   Standing    Number of Occurrences:   1    Meds ordered this encounter  Medications   ondansetron (ZOFRAN) 4 MG/2ML injection    Notch, Meghan L: cabinet override   LORazepam (ATIVAN) 2 MG/ML injection    Notch, Meghan L: cabinet override   LORazepam (ATIVAN) injection 1 mg   ondansetron (ZOFRAN) injection 4 mg   ceFEPIme (MAXIPIME) 2 g in sodium chloride 0.9 % 100 mL IVPB    Order Specific Question:   Antibiotic Indication:    Answer:   Other Indication (list below)   vancomycin (VANCOCIN) IVPB 1000 mg/200 mL premix    Order Specific Question:   Indication:    Answer:   Other Indication (list below)   acetaminophen (TYLENOL) suppository 650 mg   furosemide (LASIX) 120 mg  in dextrose 5 % 50 mL IVPB   Chlorhexidine Gluconate Cloth 2 % PADS 6 each   aspirin EC tablet 81 mg    Swallow whole.     carvedilol (COREG) tablet 25 mg   sevelamer carbonate (RENVELA) tablet 1,600 mg   sucroferric oxyhydroxide (VELPHORO) chewable tablet 500 mg   rosuvastatin (CRESTOR) tablet 5 mg    TAKE 1 TABLET BY MOUTH DAILY. TAKE ON DAYS NOT HAVING DIALYSIS.     losartan (COZAAR) tablet 50 mg   hydrALAZINE (APRESOLINE) tablet 25 mg    For HTN     amLODipine (NORVASC) tablet 10 mg    TAKE 1 TABLET(10 MG) BY MOUTH DAILY     cinacalcet (SENSIPAR) tablet 60 mg   ferrous sulfate tablet 325 mg   heparin injection 5,000 Units   sodium chloride flush (NS) 0.9 % injection 3 mL   OR Linked Order Group    acetaminophen (TYLENOL) tablet 650 mg    acetaminophen (TYLENOL) suppository 650 mg   sodium chloride flush (NS) 0.9 % injection 3 mL   sodium chloride flush (NS) 0.9 % injection 3 mL   0.9 %  sodium chloride infusion   hydrALAZINE (APRESOLINE)  injection 5 mg   albuterol (PROVENTIL) (2.5 MG/3ML) 0.083% nebulizer solution 2.5 mg   Admission Imaging : DG Chest Port 1 View  Result Date: 03/01/2023 CLINICAL DATA:  Question of sepsis to evaluate for abnormality. Weakness, chest pain, and shortness of breath for 1 week. EXAM: PORTABLE CHEST 1 VIEW COMPARISON:  08/23/2018 FINDINGS: Cardiac enlargement. Interval development of airspace consolidation throughout both lungs. Changes may represent multifocal pneumonia, edema, or ARDS. Aspiration would also be a possibility in the appropriate clinical setting. Probable small bilateral pleural effusions. No pneumothorax. Calcification of the aorta. Degenerative changes in the spine and shoulders. Vascular stent in the left axilla. IMPRESSION: Cardiac enlargement. Diffuse bilateral pulmonary infiltrates likely edema, pneumonia, or ARDS. Electronically Signed   By: Lucienne Capers M.D.   On: 03/01/2023 22:53    Physical Examination: Vitals:   03/01/23 2345 03/02/23 0113 03/02/23 0134 03/02/23 0200  BP: (!) 160/77   (!) 177/84  Pulse: 76   76  Temp:  (!) 96.5 F (35.8 C)    Resp: (!) 24   (!) 23  SpO2: 98%  96% 98%  TempSrc:  Rectal     Physical Exam Vitals and nursing note reviewed.  Constitutional:      General: She is not in acute distress.    Appearance: Normal appearance. She is not ill-appearing, toxic-appearing or diaphoretic.  HENT:     Head: Normocephalic and atraumatic.     Right Ear: Hearing and external ear normal.     Left Ear: Hearing and external ear normal.     Nose: Nose normal. No nasal deformity.     Mouth/Throat:     Lips: Pink.     Mouth: Mucous membranes are moist.     Tongue: No lesions.     Pharynx: Oropharynx is clear.  Eyes:     Extraocular Movements: Extraocular movements intact.     Pupils: Pupils are equal, round, and reactive to light.  Neck:     Vascular: No carotid bruit.  Cardiovascular:     Rate and Rhythm: Normal rate and regular rhythm.      Pulses: Normal pulses.     Heart sounds: Normal heart sounds.  Pulmonary:     Effort: Pulmonary effort is normal.     Breath sounds: Normal breath  sounds.  Abdominal:     General: Bowel sounds are normal. There is no distension.     Palpations: Abdomen is soft. There is no mass.     Tenderness: There is no abdominal tenderness. There is no guarding.     Hernia: No hernia is present.  Musculoskeletal:     Right lower leg: No edema.     Left lower leg: No edema.  Skin:    General: Skin is warm.  Neurological:     General: No focal deficit present.     Mental Status: She is alert and oriented to person, place, and time.     Cranial Nerves: Cranial nerves 2-12 are intact.     Motor: Motor function is intact.  Psychiatric:        Attention and Perception: Attention normal.        Mood and Affect: Mood normal.        Speech: Speech normal.        Behavior: Behavior normal. Behavior is cooperative.        Cognition and Memory: Cognition normal.     Assessment and Plan: * SOB (shortness of breath) Pt presenting with sob and dyspnea in respiratory distress.  SpO2: 98 % FiO2 (%): (S) 30 % On bipap. 2/2 volume overload from missed dialysis. Procalcitonin  pending.      Sepsis ([place]) Suspect underlying PNA. We will get procalcitonin.  Cont pt on vancomycin and cefepime.   Essential hypertension Vitals:   03/01/23 2216 03/01/23 2235 03/01/23 2300 03/01/23 2315  BP: (!) 192/110 (!) 192/95 (!) 172/87 (!) 169/79   03/01/23 2330 03/01/23 2345 03/02/23 0200  BP: (!) 161/75 (!) 160/77 (!) 177/84  Pt continued on coreg/ amlodipine / losartan and hydralazine.  Cont NIPPV. ABG/ VBG as needed.    Multinodular goiter We will check FT4? TSH.  Anemia    Latest Ref Rng & Units 03/01/2023   10:55 PM 06/04/2021    7:39 AM 06/03/2021    2:07 PM  CBC  WBC 4.0 - 10.5 K/uL 7.9  5.8  7.5   Hemoglobin 12.0 - 15.0 g/dL 11.6  6.1  6.6   Hematocrit 36.0 - 46.0 % 37.7  19.1  20.3   Platelets  150 - 400 K/uL 278  197  204   Type/ screen. IV PPI.   TIA (transient ischemic attack) Continue Asa/ coreg / hydralazine/ losartan .  Type 2 diabetes mellitus with other diabetic kidney complication (HCC) Glycemic protocol.  Suspect diabetes is resolved.   ESRD ON HD: Nephrology called for HD. Pt missed her session and has worsening SOB .    DVT prophylaxis:  Scd's.  Heparin.  Code Status:  Full Code.     03/01/2023   11:15 PM  Advanced Directives  Does Patient Have a Medical Advance Directive? No  Would patient like information on creating a medical advance directive? No - Patient declined    Family Communication:  Niece at bedside.   Emergency Contact: Contact Information     Name Leonard Spouse [phone]  [phone]       Disposition Plan:  Home.   Consults: Nephrology .  Admission status: Inpatient.    Unit / Expected LOS: Med tele / 2-3 days.    Para Skeans MD [facility]  6 PM- 2 AM. [phone]( Pager ) Please use AMION to locate the current  [facility] attending based on hours  and team and services [email or web address] OR  You may call [phone] to locate current [facility] attending assigned to patient.

## 2023-03-02 NOTE — Assessment & Plan Note (Signed)
Glycemic protocol.  Suspect diabetes is resolved.

## 2023-03-03 DIAGNOSIS — R0602 Shortness of breath: Secondary | ICD-10-CM | POA: Diagnosis not present

## 2023-03-03 LAB — URINALYSIS, W/ REFLEX TO CULTURE (INFECTION SUSPECTED)
Bilirubin Urine: NEGATIVE
Glucose, UA: NEGATIVE mg/dL
Hgb urine dipstick: NEGATIVE
Ketones, ur: NEGATIVE mg/dL
Leukocytes,Ua: NEGATIVE
Nitrite: NEGATIVE
Protein, ur: >=300 mg/dL — AB
Specific Gravity, Urine: 1.009 (ref 1.005–1.030)
pH: 8 (ref 5.0–8.0)

## 2023-03-03 LAB — BASIC METABOLIC PANEL
Anion gap: 15 (ref 5–15)
BUN: 50 mg/dL — ABNORMAL HIGH (ref 8–23)
CO2: 25 mmol/L (ref 22–32)
Calcium: 8.7 mg/dL — ABNORMAL LOW (ref 8.9–10.3)
Chloride: 98 mmol/L (ref 98–111)
Creatinine, Ser: 7.42 mg/dL — ABNORMAL HIGH (ref 0.44–1.00)
GFR, Estimated: 5 mL/min — ABNORMAL LOW (ref >60)
Glucose, Bld: 82 mg/dL (ref 70–99)
Potassium: 4.2 mmol/L (ref 3.5–5.1)
Sodium: 138 mmol/L (ref 135–145)

## 2023-03-03 LAB — GLUCOSE, CAPILLARY
Glucose-Capillary: 82 mg/dL (ref 70–99)
Glucose-Capillary: 91 mg/dL (ref 70–99)
Glucose-Capillary: 78 mg/dL (ref 70–99)
Glucose-Capillary: 90 mg/dL (ref 70–99)

## 2023-03-03 LAB — HEPATITIS B SURFACE ANTIBODY, QUANTITATIVE: Hep B S AB Quant (Post): 40.7 m[IU]/mL (ref >9.9)

## 2023-03-03 MED ORDER — VITAMIN D 25 MCG (1000 UNIT) PO TABS
1000.0000 [IU] | ORAL_TABLET | Freq: Every day | ORAL | Status: DC
  Administered 2023-03-03 – 2023-03-06 (×4): 1000 [IU] via ORAL
  Filled 2023-03-03 (×4): qty 1

## 2023-03-03 MED ORDER — SENNOSIDES-DOCUSATE SODIUM 8.6-50 MG PO TABS: 1.0000 | ORAL_TABLET | Freq: Every evening | ORAL | Status: DC | PRN

## 2023-03-03 MED ORDER — FUROSEMIDE 40 MG PO TABS
80.0000 mg | ORAL_TABLET | ORAL | Status: DC
  Administered 2023-03-03 – 2023-03-06 (×3): 80 mg via ORAL
  Filled 2023-03-03 (×3): qty 2

## 2023-03-03 MED ORDER — ONDANSETRON HCL 4 MG/2ML IJ SOLN: 4.0000 mg | Freq: Four times a day (QID) | INTRAMUSCULAR | Status: DC | PRN

## 2023-03-03 MED ORDER — HYDRALAZINE HCL 20 MG/ML IJ SOLN: 10.0000 mg | INTRAMUSCULAR | Status: DC | PRN

## 2023-03-03 MED ORDER — BENZONATATE 100 MG PO CAPS
100.0000 mg | ORAL_CAPSULE | Freq: Two times a day (BID) | ORAL | Status: DC | PRN
  Administered 2023-03-03: 100 mg via ORAL
  Filled 2023-03-03: qty 1

## 2023-03-03 MED ORDER — GUAIFENESIN 100 MG/5ML PO LIQD: 5.0000 mL | ORAL | Status: DC | PRN

## 2023-03-03 MED ORDER — TRAZODONE HCL 50 MG PO TABS
50.0000 mg | ORAL_TABLET | Freq: Every evening | ORAL | Status: DC | PRN
  Administered 2023-03-03: 50 mg via ORAL
  Filled 2023-03-03: qty 1

## 2023-03-03 MED ORDER — IPRATROPIUM-ALBUTEROL 0.5-2.5 (3) MG/3ML IN SOLN: 3.0000 mL | RESPIRATORY_TRACT | Status: DC | PRN

## 2023-03-03 MED ORDER — SENNOSIDES-DOCUSATE SODIUM 8.6-50 MG PO TABS
2.0000 | ORAL_TABLET | Freq: Every day | ORAL | Status: DC
  Administered 2023-03-03 – 2023-03-06 (×4): 2 via ORAL
  Filled 2023-03-03 (×4): qty 2

## 2023-03-03 MED ORDER — METOPROLOL TARTRATE 5 MG/5ML IV SOLN: 5.0000 mg | INTRAVENOUS | Status: DC | PRN

## 2023-03-03 MED ORDER — HYDRALAZINE HCL 50 MG PO TABS
50.0000 mg | ORAL_TABLET | Freq: Three times a day (TID) | ORAL | Status: DC
  Administered 2023-03-03 – 2023-03-06 (×10): 50 mg via ORAL
  Filled 2023-03-03 (×10): qty 1

## 2023-03-03 NOTE — Evaluation (Signed)
Physical Therapy Evaluation Patient Details Name: Kristen Francis MRN: [id number] DOB: 11/11/1946 Today's Date: 03/03/2023  History of Present Illness  77 year old with history of GERD, ESRD, HLD, HTN, CVA, arthritis admitted for shortness of breath.  Patient missed dialysis on Wednesday.  Admitted for sepsis secondary to pneumonia.  Patient was also noted to be in fluid overload therefore received dialysis in the hospital.   Clinical Impression  Patient received reclining in bed and agreeable to PT evaluation. Patient was sleepy throughout session but alertness WFL. Patient was able to provide detailed history. She lives with her husband in a single story home with 3 steps to enter without a handrail. Prior to hospitalization, she was I with all aspects of care and mobility with no AD. She worked part time cleaning. Upon PT evaluation, she completed bed mobility mod I, transfers with supervision, and ambulated ~ 200 feet with CGA and no AD. She did demonstrate a narrow base of support with occasional staggers while ambulating but declined PT recommendation for a RW. Patient was on 2L/min O2 upon arrival and this was replaced at end of session, but she completed ambulation on room air with SpO2 staying in the 90s. Patient would benefit from skilled physical therapy to address impairments and functional limitations (see PT Problem List below) to work towards stated goals and return to PLOF or maximal functional independence.     Recommendations for follow up therapy are one component of a multi-disciplinary discharge planning process, led by the attending physician.  Recommendations may be updated based on patient status, additional functional criteria and insurance authorization.  Follow Up Recommendations       Assistance Recommended at Discharge Intermittent Supervision/Assistance  Patient can return home with the following  A little help with walking and/or transfers;A little help with  bathing/dressing/bathroom;Help with stairs or ramp for entrance;Assistance with cooking/housework;Assist for transportation    Equipment Recommendations Other (comment) (PT reccomended rolling walker and patient declined)  Recommendations for Other Services       Functional Status Assessment Patient has had a recent decline in their functional status and demonstrates the ability to make significant improvements in function in a reasonable and predictable amount of time.     Precautions / Restrictions Precautions Precautions: Fall Restrictions Weight Bearing Restrictions: No      Mobility  Bed Mobility Overal bed mobility: Modified Independent                  Transfers Overall transfer level: Needs assistance   Transfers: Sit to/from Stand Sit to Stand: Supervision           General transfer comment: Patient required cuing to keep from getting tangled in lines.    Ambulation/Gait Ambulation/Gait assistance: Min guard Gait Distance (Feet): 200 Feet Assistive device: None Gait Pattern/deviations: Staggering left, Staggering right, Narrow base of support Gait velocity: decreased     General Gait Details: Patient ambulated around nursing station with CGA and no AD. She occasionally staggered right or left but was able to recover balance with step strategy. When in room she occasionally reached for objects to steady herself.  Stairs            Wheelchair Mobility    Modified Rankin (Stroke Patients Only)       Balance Overall balance assessment: Needs assistance Sitting-balance support: No upper extremity supported Sitting balance-Leahy Scale: Normal     Standing balance support: No upper extremity supported, During functional activity Standing balance-Leahy Scale: Good Standing balance comment:  Patient occasinally staggred during ambulation with no AD but was able to recover using step strategy.                              Pertinent Vitals/Pain Pain Assessment Pain Assessment: No/denies pain    Home Living Family/patient expects to be discharged to:: Private residence Living Arrangements: Spouse/significant other Available Help at Discharge: Available 24 hours/day;Family Type of Home: House Home Access: Stairs to enter Entrance Stairs-Rails: None Entrance Stairs-Number of Steps: 3 steps   Home Layout: One level Home Equipment: None      Prior Function Prior Level of Function : Independent/Modified Independent;Working/employed;Driving (works part Actuary)             Mobility Comments: Patient reports that prior to hospitalization she was I with all aspects of mobility with no falls in the last 6 months. ADLs Comments: Patient reports she is I with all aspects of care and works part time cleaning prior to hospitalization.     Hand Dominance   Dominant Hand: Right    Extremity/Trunk Assessment   Upper Extremity Assessment Upper Extremity Assessment: Overall WFL for tasks assessed    Lower Extremity Assessment Lower Extremity Assessment: Generalized weakness    Cervical / Trunk Assessment Cervical / Trunk Assessment: Normal  Communication   Communication: No difficulties  Cognition Arousal/Alertness: Awake/alert Behavior During Therapy: WFL for tasks assessed/performed Overall Cognitive Status: Within Functional Limits for tasks assessed                                          General Comments General comments (skin integrity, edema, etc.): SpO2 93% after ambulation on room air.  HR WFL    Exercises Other Exercises Other Exercises: patient educated on role of PT in acute care setting and discharge reccomendations.   Assessment/Plan    PT Assessment Patient needs continued PT services  PT Problem List Decreased strength;Decreased balance;Decreased mobility;Decreased activity tolerance       PT Treatment Interventions DME instruction;Functional  mobility training;Balance training;Patient/family education;Gait training;Therapeutic activities;Neuromuscular re-education;Stair training;Therapeutic exercise    PT Goals (Current goals can be found in the Care Plan section)  Acute Rehab PT Goals Patient Stated Goal: to go home PT Goal Formulation: With patient Time For Goal Achievement: 03/17/23 Potential to Achieve Goals: Good    Frequency Min 2X/week     Co-evaluation               AM-PAC PT "6 Clicks" Mobility  Outcome Measure Help needed turning from your back to your side while in a flat bed without using bedrails?: None Help needed moving from lying on your back to sitting on the side of a flat bed without using bedrails?: None Help needed moving to and from a bed to a chair (including a wheelchair)?: None Help needed standing up from a chair using your arms (e.g., wheelchair or bedside chair)?: None Help needed to walk in hospital room?: A Little Help needed climbing 3-5 steps with a railing? : A Little 6 Click Score: 22    End of Session Equipment Utilized During Treatment: Gait belt Activity Tolerance: Patient tolerated treatment well Patient left: in chair;with chair alarm set;with call bell/phone within reach Nurse Communication: Mobility status PT Visit Diagnosis: Difficulty in walking, not elsewhere classified (R26.2);Muscle weakness (generalized) (M62.81)    Time: [id number]  PT Time Calculation (min) (ACUTE ONLY): 18 min   Charges:   PT Evaluation $PT Eval Low Complexity: 1 Low          Kaeleigh Westendorf R. Graylon Good, PT, DPT 03/03/23, 3:40 PM

## 2023-03-03 NOTE — Evaluation (Signed)
Occupational Therapy Evaluation Patient Details Name: Kristen Francis MRN: [id number] DOB: [birth year] Today's Date: 03/03/2023   History of Present Illness 77 year old with history of GERD, ESRD, HLD, HTN, CVA, arthritis admitted for shortness of breath.  Patient missed dialysis on Wednesday.  Admitted for sepsis secondary to pneumonia.  Patient was also noted to be in fluid overload therefore received dialysis in the hospital.   Clinical Impression   Patient agreeable to OT evaluation. Pt presenting with decreased independence in self care, balance, functional mobility/transfers, and endurance. PTA pt was independent for ADLs, IADLs, and functional mobility without an AD. Pt lives with spouse, still drives, and works part time. Pt currently functioning at Mod I for bed mobility, supervision for functional mobility to the bathroom using a RW, supervision for toilet transfer, and supervision for sinkside grooming tasks. Pt appears close to baseline level of function with ADLs. Will keep on OT caseload to continue ADL training in order to prevent functional decline and reduce caregiver burden. No follow up OT needs recommended at this time.   Recommendations for follow up therapy are one component of a multi-disciplinary discharge planning process, led by the attending physician.  Recommendations may be updated based on patient status, additional functional criteria and insurance authorization.   Assistance Recommended at Discharge PRN  Patient can return home with the following Assistance with cooking/housework;Assist for transportation    Functional Status Assessment  Patient has had a recent decline in their functional status and demonstrates the ability to make significant improvements in function in a reasonable and predictable amount of time.  Equipment Recommendations  None recommended by OT    Recommendations for Other Services       Precautions / Restrictions  Precautions Precautions: Fall Restrictions Weight Bearing Restrictions: No      Mobility Bed Mobility Overal bed mobility: Modified Independent      Transfers Overall transfer level: Needs assistance Equipment used: None Transfers: Sit to/from Stand Sit to Stand: Supervision                  Balance Overall balance assessment: Needs assistance Sitting-balance support: No upper extremity supported Sitting balance-Leahy Scale: Normal     Standing balance support: No upper extremity supported, During functional activity Standing balance-Leahy Scale: Good         ADL either performed or assessed with clinical judgement   ADL Overall ADL's : Needs assistance/impaired     Grooming: Supervision/safety;Standing;Wash/dry hands       Toilet Transfer: Ambulation;Supervision/safety;Regular Toilet;Rolling walker (2 wheels)   Toileting- Clothing Manipulation and Hygiene: Supervision/safety;Sit to/from stand Toileting - Clothing Manipulation Details (indicate cue type and reason): for peri care after continent void on toilet     Functional mobility during ADLs: Supervision/safety;Rolling walker (2 wheels) (~2ft to the bathroom)       Vision Baseline Vision/History: 1 Wears glasses Patient Visual Report: No change from baseline       Perception     Praxis      Pertinent Vitals/Pain Pain Assessment Pain Assessment: No/denies pain     Hand Dominance Right   Extremity/Trunk Assessment Upper Extremity Assessment Upper Extremity Assessment: Overall WFL for tasks assessed   Lower Extremity Assessment Lower Extremity Assessment: Defer to PT evaluation   Cervical / Trunk Assessment Cervical / Trunk Assessment: Normal   Communication Communication Communication: No difficulties   Cognition Arousal/Alertness: Awake/alert Behavior During Therapy: WFL for tasks assessed/performed Overall Cognitive Status: Within Functional Limits for tasks assessed  General Comments  Pt received on 2L O2 via [place], SpO2 maintained >90% with activity.    Exercises Other Exercises Other Exercises: OT provided education re: role of OT, OT POC, post acute recs, sitting up for all meals, EOB/OOB mobility with assistance, home/fall safety.     Shoulder Instructions      Home Living Family/patient expects to be discharged to:: Private residence Living Arrangements: Spouse/significant other Available Help at Discharge: Available 24 hours/day;Family Type of Home: House Home Access: Stairs to enter [employer] of Steps: 3 steps Entrance Stairs-Rails: None Home Layout: One level     Bathroom Shower/Tub: Occupational psychologist: Standard     Home Equipment: None          Prior Functioning/Environment Prior Level of Function : Independent/Modified Independent;Working/employed;Driving             Mobility Comments: Patient reports that prior to hospitalization she was I with all aspects of mobility with no falls in the last 6 months. ADLs Comments: Independent for ADLs/IADLs, still driving, works part time cleaning houses        OT Problem List: Decreased activity tolerance;Impaired balance (sitting and/or standing);Cardiopulmonary status limiting activity      OT Treatment/Interventions: Self-care/ADL training;Therapeutic exercise;Neuromuscular education;Energy conservation;DME and/or AE instruction;Manual therapy;Modalities;Balance training;Patient/family education;Visual/perceptual remediation/compensation;Cognitive remediation/compensation;Therapeutic activities;Splinting    OT Goals(Current goals can be found in the care plan section) Acute Rehab OT Goals Patient Stated Goal: return home OT Goal Formulation: With patient Time For Goal Achievement: 03/17/23 Potential to Achieve Goals: Good   OT Frequency: Min 2X/week    Co-evaluation              AM-PAC OT "6 Clicks" Daily Activity     Outcome Measure  Help from another person eating meals?: None Help from another person taking care of personal grooming?: None Help from another person toileting, which includes using toliet, bedpan, or urinal?: A Little Help from another person bathing (including washing, rinsing, drying)?: A Little Help from another person to put on and taking off regular upper body clothing?: None Help from another person to put on and taking off regular lower body clothing?: None 6 Click Score: 22   End of Session Equipment Utilized During Treatment: Rolling walker (2 wheels);Oxygen Nurse Communication: Mobility status  Activity Tolerance: Patient tolerated treatment well Patient left: in bed;with call bell/phone within reach;with bed alarm set  OT Visit Diagnosis: Other abnormalities of gait and mobility (R26.89)                Time: [id number] OT Time Calculation (min): 11 min Charges:  OT General Charges $OT Visit: 1 Visit OT Evaluation $OT Eval Low Complexity: 1 Low  [facility] MS, OTR/L ascom [phone]  03/03/23, 5:56 PM

## 2023-03-03 NOTE — Progress Notes (Signed)
PROGRESS NOTE    Kristen Francis  [id number] DOB: [birth year] DOA: 03/01/2023 PCP: Jonetta Osgood, NP   Brief Narrative:  77 year old with history of GERD, ESRD, HLD, HTN, CVA, arthritis admitted for shortness of breath.  Patient missed dialysis on Wednesday.  Admitted for sepsis secondary to pneumonia.  Patient was also noted to be in fluid overload therefore received dialysis in the hospital.   Assessment & Plan:  Principal Problem:   SOB (shortness of breath) Active Problems:   Sepsis ([place])   Essential hypertension   Multinodular goiter   Anemia   TIA (transient ischemic attack)   Type 2 diabetes mellitus with other diabetic kidney complication (HCC)     Assessment and Plan: Acute respiratory distress secondary to bilateral infiltrate Sepsis secondary to bilateral pneumonia Patient improved after session of dialysis.  Low-grade temperature.  Blood cultures negative. On empiric cefepime, discontinue vancomycin.  MRSA swab is negative Bronchodilators, I-S/flutter valve COVID/flu-negative CTA chest ordered  but will cancel. Low suspicion from thromboembolic dz  ESRD on HD Missed HD on Wednesday. Dr Holley Raring notified. Received a of Lasix in ED. status post HD on 3/29.   Essential hypertension -Continue Coreg, losartan - IV as needed   Multinodular goiter TSH/free T4 are normal.  Follow-up outpatient  Anemia Hb stable 11.0 No need for PPI. Hb is stable, no signs of bleeding.    TIA (transient ischemic attack) Cont home ASA and statin  Type 2 diabetes mellitus with other diabetic kidney complication (HCC) Sliding scale and Accu-Cheks    DVT prophylaxis: SQ Heparin Code Status: Full  Family Communication: Husband at bedside Status is: Inpatient Continue hospital stay until her volume status is improved.  Hopefully discharge tomorrow     Diet Orders (From admission, onward)     Start     Ordered   03/02/23 0119  Diet renal with fluid restriction  Fluid restriction: 1200 mL Fluid; Room service appropriate? Yes; Fluid consistency: Thin  Diet effective now       Question Answer Comment  Fluid restriction: 1200 mL Fluid   Room service appropriate? Yes   Fluid consistency: Thin      03/02/23 0119            Subjective:  Seen at bedside, doing well. Examination: Constitutional: Not in acute distress Respiratory: Mild basilar crackles Cardiovascular: Normal sinus rhythm, no rubs Abdomen: Nontender nondistended good bowel sounds Musculoskeletal: No edema noted Skin: No rashes seen Neurologic: CN 2-12 grossly intact.  And nonfocal Psychiatric: Normal judgment and insight. Alert and oriented x 3. Normal mood.  Objective: Vitals:   03/02/23 1532 03/02/23 2003 03/03/23 0432 03/03/23 0740  BP: (!) 157/71 (!) 156/77 (!) 171/84 (!) 173/82  Pulse: 88 81 80 78  Resp: 18 20 20 18   Temp: 98.2 F (36.8 C) 99.7 F (37.6 C) 99 F (37.2 C) 98 F (36.7 C)  TempSrc:   Oral   SpO2: 98% 99% 99% 98%  Weight:   55.3 kg   Height:        Intake/Output Summary (Last 24 hours) at 03/03/2023 0751 Last data filed at 03/03/2023 0300 Gross per 24 hour  Intake 580 ml  Output 2500 ml  Net -1920 ml   Filed Weights   03/02/23 0931 03/02/23 1441 03/03/23 0432  Weight: 55.1 kg 53.2 kg 55.3 kg    Scheduled Meds:  amLODipine  10 mg Oral Daily   aspirin EC  81 mg Oral Daily   carvedilol  25 mg Oral BID  WC   Chlorhexidine Gluconate Cloth  6 each Topical Q0600   cinacalcet  60 mg Oral QPM   ferrous sulfate  325 mg Oral BID WC   heparin  5,000 Units Subcutaneous Q12H   hydrALAZINE  25 mg Oral TID   insulin aspart  0-5 Units Subcutaneous QHS   insulin aspart  0-6 Units Subcutaneous TID WC   losartan  50 mg Oral Daily   rosuvastatin  5 mg Oral Q T,Th,S,Su   sevelamer carbonate  1,600 mg Oral TID with meals   sodium chloride flush  3 mL Intravenous Q12H   sodium chloride flush  3 mL Intravenous Q12H   sucroferric oxyhydroxide  500 mg Oral  TID with meals   Continuous Infusions:  sodium chloride     ceFEPime (MAXIPIME) IV Stopped (03/02/23 1453)    Nutritional status     Body mass index is 21.6 kg/m.  Data Reviewed:   CBC: Recent Labs  Lab 03/01/23 2255 03/02/23 0255 03/02/23 1354  WBC 7.9 8.2 8.0  NEUTROABS 6.1  --   --   HGB 11.6* 11.0* 10.6*  HCT 37.7 35.8* 32.9*  MCV 90.2 88.4 85.5  PLT 278 245 [id number]   Basic Metabolic Panel: Recent Labs  Lab 03/01/23 2255 03/02/23 0255  NA 142 141  K 4.1 4.3  CL 98 99  CO2 23 24  GLUCOSE 121* 115*  BUN 80* 85*  CREATININE 10.38* 10.60*  CALCIUM 8.7* 8.4*   GFR: Estimated Creatinine Clearance: 3.8 mL/min (A) (by C-G formula based on SCr of 10.6 mg/dL (H)). Liver Function Tests: Recent Labs  Lab 03/01/23 2255 03/02/23 0255  AST 39 29  ALT 33 29  ALKPHOS 49 49  BILITOT 0.9 0.9  PROT 7.3 6.9  ALBUMIN 3.9 3.7   No results for input(s): "LIPASE", "AMYLASE" in the last 168 hours. No results for input(s): "AMMONIA" in the last 168 hours. Coagulation Profile: No results for input(s): "INR", "PROTIME" in the last 168 hours. Cardiac Enzymes: No results for input(s): "CKTOTAL", "CKMB", "CKMBINDEX", "TROPONINI" in the last 168 hours. BNP (last 3 results) No results for input(s): "PROBNP" in the last 8760 hours. HbA1C: No results for input(s): "HGBA1C" in the last 72 hours. CBG: Recent Labs  Lab 03/02/23 0239 03/02/23 1130 03/02/23 1605 03/02/23 2104 03/03/23 0742  GLUCAP 105* 114* 158* 85 82   Lipid Profile: No results for input(s): "CHOL", "HDL", "LDLCALC", "TRIG", "CHOLHDL", "LDLDIRECT" in the last 72 hours. Thyroid Function Tests: Recent Labs    03/02/23 0255  TSH 2.809  FREET4 0.87   Anemia Panel: No results for input(s): "VITAMINB12", "FOLATE", "FERRITIN", "TIBC", "IRON", "RETICCTPCT" in the last 72 hours. Sepsis Labs: Recent Labs  Lab 03/01/23 2308 03/02/23 0202 03/02/23 0255  PROCALCITON  --   --  1.01  LATICACIDVEN 1.7 0.6  --      Recent Results (from the past 240 hour(s))  Resp panel by RT-PCR (RSV, Flu A&B, Covid) Anterior Nasal Swab     Status: None   Collection Time: 03/01/23 10:55 PM   Specimen: Anterior Nasal Swab  Result Value Ref Range Status   SARS Coronavirus 2 by RT PCR NEGATIVE NEGATIVE Final    Comment: (NOTE) SARS-CoV-2 target nucleic acids are NOT DETECTED.  The SARS-CoV-2 RNA is generally detectable in upper respiratory specimens during the acute phase of infection. The lowest concentration of SARS-CoV-2 viral copies this assay can detect is 138 copies/mL. A negative result does not preclude SARS-Cov-2 infection and should not be used  as the sole basis for treatment or other patient management decisions. A negative result may occur with  improper specimen collection/handling, submission of specimen other than nasopharyngeal swab, presence of viral mutation(s) within the areas targeted by this assay, and inadequate number of viral copies(<138 copies/mL). A negative result must be combined with clinical observations, patient history, and epidemiological information. The expected result is Negative.  Fact Sheet for Patients:  EntrepreneurPulse.com.au  Fact Sheet for Healthcare Providers:  IncredibleEmployment.be  This test is no t yet approved or cleared by the [place] FDA and  has been authorized for detection and/or diagnosis of SARS-CoV-2 by FDA under an Emergency Use Authorization (EUA). This EUA will remain  in effect (meaning this test can be used) for the duration of the COVID-19 declaration under Section 564(b)(1) of the Act, 21 U.S.C.section 360bbb-3(b)(1), unless the authorization is terminated  or revoked sooner.       Influenza A by PCR NEGATIVE NEGATIVE Final   Influenza B by PCR NEGATIVE NEGATIVE Final    Comment: (NOTE) The Xpert Xpress SARS-CoV-2/FLU/RSV plus assay is intended as an aid in the diagnosis of influenza from  Nasopharyngeal swab specimens and should not be used as a sole basis for treatment. Nasal washings and aspirates are unacceptable for Xpert Xpress SARS-CoV-2/FLU/RSV testing.  Fact Sheet for Patients: EntrepreneurPulse.com.au  Fact Sheet for Healthcare Providers: IncredibleEmployment.be  This test is not yet approved or cleared by the [place] FDA and has been authorized for detection and/or diagnosis of SARS-CoV-2 by FDA under an Emergency Use Authorization (EUA). This EUA will remain in effect (meaning this test can be used) for the duration of the COVID-19 declaration under Section 564(b)(1) of the Act, 21 U.S.C. section 360bbb-3(b)(1), unless the authorization is terminated or revoked.     Resp Syncytial Virus by PCR NEGATIVE NEGATIVE Final    Comment: (NOTE) Fact Sheet for Patients: EntrepreneurPulse.com.au  Fact Sheet for Healthcare Providers: IncredibleEmployment.be  This test is not yet approved or cleared by the [place] FDA and has been authorized for detection and/or diagnosis of SARS-CoV-2 by FDA under an Emergency Use Authorization (EUA). This EUA will remain in effect (meaning this test can be used) for the duration of the COVID-19 declaration under Section 564(b)(1) of the Act, 21 U.S.C. section 360bbb-3(b)(1), unless the authorization is terminated or revoked.  Performed at [facility], [address] [place], [place] [address]   Blood Culture (routine x 2)     Status: None (Preliminary result)   Collection Time: 03/01/23 11:08 PM   Specimen: BLOOD  Result Value Ref Range Status   Specimen Description BLOOD BLOOD RIGHT ARM  Final   Special Requests   Final    BOTTLES DRAWN AEROBIC AND ANAEROBIC Blood Culture results may not be optimal due to an inadequate volume of blood received in culture bottles   Culture   Final    NO GROWTH 2 DAYS Performed at [facility], [address], [place], [place] [address]    Report Status PENDING  Incomplete  Blood Culture (routine x 2)     Status: None (Preliminary result)   Collection Time: 03/02/23  2:56 AM   Specimen: BLOOD  Result Value Ref Range Status   Specimen Description BLOOD BLOOD RIGHT ARM  Final   Special Requests   Final    BOTTLES DRAWN AEROBIC AND ANAEROBIC Blood Culture adequate volume   Culture   Final    NO GROWTH 1 DAY Performed at [facility], [address] [place], [place] [address]    Report Status PENDING  Incomplete  MRSA Next Gen by PCR, Nasal     Status: None   Collection Time: 03/02/23  2:32 PM   Specimen: Nasal Mucosa; Nasal Swab  Result Value Ref Range Status   MRSA by PCR Next Gen NOT DETECTED NOT DETECTED Final    Comment: (NOTE) The GeneXpert MRSA Assay (FDA approved for NASAL specimens only), is one component of a comprehensive MRSA colonization surveillance program. It is not intended to diagnose MRSA infection nor to guide or monitor treatment for MRSA infections. Test performance is not FDA approved in patients less than 65 years old. Performed at [facility], [address] [place], [place] [address]          Radiology Studies: [facility] Chest [place] 1 View  Result Date: 03/01/2023 CLINICAL DATA:  Question of sepsis to evaluate for abnormality. Weakness, chest pain, and shortness of breath for 1 week. EXAM: PORTABLE CHEST 1 VIEW COMPARISON:  08/23/2018 FINDINGS: Cardiac enlargement. Interval development of airspace consolidation throughout both lungs. Changes may represent multifocal pneumonia, edema, or ARDS. Aspiration would also be a possibility in the appropriate clinical setting. Probable small bilateral pleural effusions. No pneumothorax. Calcification of the aorta. Degenerative changes in the spine and shoulders. Vascular stent in the left axilla. IMPRESSION: Cardiac enlargement. Diffuse bilateral pulmonary infiltrates likely  edema, pneumonia, or ARDS. Electronically Signed   By: Lucienne Capers M.D.   On: 03/01/2023 22:53           LOS: 1 day   Time spent= 35 mins    Geraldene Eisel Arsenio Loader, MD [facility]  If 7PM-7AM, please contact night-coverage  03/03/2023, 7:51 AM

## 2023-03-03 NOTE — Progress Notes (Signed)
[facility]  ROUNDING NOTE   Subjective:   Emergent hemodialysis treatment on admission yesterday. UF of 2.5 liters. Patient started to have cramping at the end of treatment.   Brother at bedside.   Patient moved out of ICU yesterday.   Objective:  Vital signs in last 24 hours:  Temp:  [98 F (36.7 C)-99.7 F (37.6 C)] 98 F (36.7 C) (03/30 0740) Pulse Rate:  [77-89] 77 (03/30 0830) Resp:  [18-31] 18 (03/30 0740) BP: (142-185)/(71-97) 169/82 (03/30 0830) SpO2:  [91 %-100 %] 100 % (03/30 0830) Weight:  [53.2 kg-55.3 kg] 55.3 kg (03/30 0432)  Weight change: 0.2 kg Filed Weights   03/02/23 0931 03/02/23 1441 03/03/23 0432  Weight: 55.1 kg 53.2 kg 55.3 kg    Intake/Output: I/O last 3 completed shifts: In: 580 [P.O.:480; IV Piggyback:100] Out: 2500 [Other:2500]   Intake/Output this shift:  No intake/output data recorded.  Physical Exam: General: NAD, laying in bed  Head: Normocephalic, atraumatic. Moist oral mucosal membranes  Eyes: Anicteric, PERRL  Neck: Supple, trachea midline  Lungs:  Clear to auscultation, 2L [place]  Heart: Regular rate and rhythm  Abdomen:  Soft, nontender,   Extremities:  no peripheral edema.  Neurologic: Nonfocal, moving all four extremities  Skin: No lesions  Access: Left arm AVF    Basic Metabolic Panel: Recent Labs  Lab 03/01/23 2255 03/02/23 0255 03/03/23 0829  NA 142 141 138  K 4.1 4.3 4.2  CL 98 99 98  CO2 23 24 25   GLUCOSE 121* 115* 82  BUN 80* 85* 50*  CREATININE 10.38* 10.60* 7.42*  CALCIUM 8.7* 8.4* 8.7*    Liver Function Tests: Recent Labs  Lab 03/01/23 2255 03/02/23 0255  AST 39 29  ALT 33 29  ALKPHOS 49 49  BILITOT 0.9 0.9  PROT 7.3 6.9  ALBUMIN 3.9 3.7   No results for input(s): "LIPASE", "AMYLASE" in the last 168 hours. No results for input(s): "AMMONIA" in the last 168 hours.  CBC: Recent Labs  Lab 03/01/23 2255 03/02/23 0255 03/02/23 1354  WBC 7.9 8.2 8.0  NEUTROABS 6.1  --   --    HGB 11.6* 11.0* 10.6*  HCT 37.7 35.8* 32.9*  MCV 90.2 88.4 85.5  PLT 278 245 237    Cardiac Enzymes: No results for input(s): "CKTOTAL", "CKMB", "CKMBINDEX", "TROPONINI" in the last 168 hours.  BNP: Invalid input(s): "POCBNP"  CBG: Recent Labs  Lab 03/02/23 0239 03/02/23 1130 03/02/23 1605 03/02/23 2104 03/03/23 0742  GLUCAP 105* 114* 158* 85 82    Microbiology: Results for orders placed or performed during the hospital encounter of 03/01/23  Resp panel by RT-PCR (RSV, Flu A&B, Covid) Anterior Nasal Swab     Status: None   Collection Time: 03/01/23 10:55 PM   Specimen: Anterior Nasal Swab  Result Value Ref Range Status   SARS Coronavirus 2 by RT PCR NEGATIVE NEGATIVE Final    Comment: (NOTE) SARS-CoV-2 target nucleic acids are NOT DETECTED.  The SARS-CoV-2 RNA is generally detectable in upper respiratory specimens during the acute phase of infection. The lowest concentration of SARS-CoV-2 viral copies this assay can detect is 138 copies/mL. A negative result does not preclude SARS-Cov-2 infection and should not be used as the sole basis for treatment or other patient management decisions. A negative result may occur with  improper specimen collection/handling, submission of specimen other than nasopharyngeal swab, presence of viral mutation(s) within the areas targeted by this assay, and inadequate number of viral copies(<138 copies/mL). A  negative result must be combined with clinical observations, patient history, and epidemiological information. The expected result is Negative.  Fact Sheet for Patients:  EntrepreneurPulse.com.au  Fact Sheet for Healthcare Providers:  IncredibleEmployment.be  This test is no t yet approved or cleared by the [place] FDA and  has been authorized for detection and/or diagnosis of SARS-CoV-2 by FDA under an Emergency Use Authorization (EUA). This EUA will remain  in effect (meaning this  test can be used) for the duration of the COVID-19 declaration under Section 564(b)(1) of the Act, 21 U.S.C.section 360bbb-3(b)(1), unless the authorization is terminated  or revoked sooner.       Influenza A by PCR NEGATIVE NEGATIVE Final   Influenza B by PCR NEGATIVE NEGATIVE Final    Comment: (NOTE) The Xpert Xpress SARS-CoV-2/FLU/RSV plus assay is intended as an aid in the diagnosis of influenza from Nasopharyngeal swab specimens and should not be used as a sole basis for treatment. Nasal washings and aspirates are unacceptable for Xpert Xpress SARS-CoV-2/FLU/RSV testing.  Fact Sheet for Patients: EntrepreneurPulse.com.au  Fact Sheet for Healthcare Providers: IncredibleEmployment.be  This test is not yet approved or cleared by the [place] FDA and has been authorized for detection and/or diagnosis of SARS-CoV-2 by FDA under an Emergency Use Authorization (EUA). This EUA will remain in effect (meaning this test can be used) for the duration of the COVID-19 declaration under Section 564(b)(1) of the Act, 21 U.S.C. section 360bbb-3(b)(1), unless the authorization is terminated or revoked.     Resp Syncytial Virus by PCR NEGATIVE NEGATIVE Final    Comment: (NOTE) Fact Sheet for Patients: EntrepreneurPulse.com.au  Fact Sheet for Healthcare Providers: IncredibleEmployment.be  This test is not yet approved or cleared by the [place] FDA and has been authorized for detection and/or diagnosis of SARS-CoV-2 by FDA under an Emergency Use Authorization (EUA). This EUA will remain in effect (meaning this test can be used) for the duration of the COVID-19 declaration under Section 564(b)(1) of the Act, 21 U.S.C. section 360bbb-3(b)(1), unless the authorization is terminated or revoked.  Performed at [facility], [address] [place], [place] [address]   Blood Culture (routine x 2)      Status: None (Preliminary result)   Collection Time: 03/01/23 11:08 PM   Specimen: BLOOD  Result Value Ref Range Status   Specimen Description BLOOD BLOOD RIGHT ARM  Final   Special Requests   Final    BOTTLES DRAWN AEROBIC AND ANAEROBIC Blood Culture results may not be optimal due to an inadequate volume of blood received in culture bottles   Culture   Final    NO GROWTH 2 DAYS Performed at [facility], [address] [place], [place] [address]    Report Status PENDING  Incomplete  Blood Culture (routine x 2)     Status: None (Preliminary result)   Collection Time: 03/02/23  2:56 AM   Specimen: BLOOD  Result Value Ref Range Status   Specimen Description BLOOD BLOOD RIGHT ARM  Final   Special Requests   Final    BOTTLES DRAWN AEROBIC AND ANAEROBIC Blood Culture adequate volume   Culture   Final    NO GROWTH 1 DAY Performed at [facility], [address] [place], [place] [address]    Report Status PENDING  Incomplete  MRSA Next Gen by PCR, Nasal     Status: None   Collection Time: 03/02/23  2:32 PM   Specimen: Nasal Mucosa; Nasal Swab  Result Value  Ref Range Status   MRSA by PCR Next Gen NOT DETECTED NOT DETECTED Final    Comment: (NOTE) The GeneXpert MRSA Assay (FDA approved for NASAL specimens only), is one component of a comprehensive MRSA colonization surveillance program. It is not intended to diagnose MRSA infection nor to guide or monitor treatment for MRSA infections. Test performance is not FDA approved in patients less than 41 years old. Performed at [facility], [address] [place], [place] [address]     Coagulation Studies: No results for input(s): "LABPROT", "INR" in the last 72 hours.  Urinalysis: No results for input(s): "COLORURINE", "LABSPEC", "PHURINE", "GLUCOSEU", "HGBUR", "BILIRUBINUR", "KETONESUR", "PROTEINUR", "UROBILINOGEN", "NITRITE", "LEUKOCYTESUR" in the last 72 hours.  Invalid input(s): "APPERANCEUR"     Imaging: DG Chest Port 1 View  Result Date: 03/01/2023 CLINICAL DATA:  Question of sepsis to evaluate for abnormality. Weakness, chest pain, and shortness of breath for 1 week. EXAM: PORTABLE CHEST 1 VIEW COMPARISON:  08/23/2018 FINDINGS: Cardiac enlargement. Interval development of airspace consolidation throughout both lungs. Changes may represent multifocal pneumonia, edema, or ARDS. Aspiration would also be a possibility in the appropriate clinical setting. Probable small bilateral pleural effusions. No pneumothorax. Calcification of the aorta. Degenerative changes in the spine and shoulders. Vascular stent in the left axilla. IMPRESSION: Cardiac enlargement. Diffuse bilateral pulmonary infiltrates likely edema, pneumonia, or ARDS. Electronically Signed   By: Lucienne Capers M.D.   On: 03/01/2023 22:53     Medications:    sodium chloride     ceFEPime (MAXIPIME) IV Stopped (03/02/23 1453)    amLODipine  10 mg Oral Daily   aspirin EC  81 mg Oral Daily   carvedilol  25 mg Oral BID WC   Chlorhexidine Gluconate Cloth  6 each Topical Q0600   cholecalciferol  1,000 Units Oral Daily   cinacalcet  60 mg Oral QPM   ferrous sulfate  325 mg Oral BID WC   heparin  5,000 Units Subcutaneous Q12H   hydrALAZINE  50 mg Oral TID   insulin aspart  0-5 Units Subcutaneous QHS   insulin aspart  0-6 Units Subcutaneous TID WC   losartan  50 mg Oral Daily   rosuvastatin  5 mg Oral Q T,Th,S,Su   senna-docusate  2 tablet Oral Daily   sevelamer carbonate  1,600 mg Oral TID with meals   sodium chloride flush  3 mL Intravenous Q12H   sodium chloride flush  3 mL Intravenous Q12H   sucroferric oxyhydroxide  500 mg Oral TID with meals   sodium chloride, acetaminophen **OR** acetaminophen, alteplase, benzonatate, guaiFENesin, heparin, hydrALAZINE, iohexol, ipratropium-albuterol, lidocaine (PF), lidocaine-prilocaine, metoprolol tartrate, ondansetron (ZOFRAN) IV, pentafluoroprop-tetrafluoroeth, senna-docusate,  sodium chloride flush, traZODone  Assessment/ Plan:  Ms. Kristen Francis is a 77 y.o.  female with end stage renal disease on hemodialysis, hypertension, hyperlipidemia, CVA, macular degeneration, who was admitted to [facility] on 03/01/2023 for Respiratory distress [R06.03] SOB (shortness of breath) [R06.02] Pneumonia due to infectious organism, unspecified laterality, unspecified part of lung [J18.9]   [facility] Nephrology MWF [facility] Left AVF 54kg   End Stage Renal Disease: requiring emergency hemodialysis treatment on admission. No acute indication for dialysis today. Next scheduled dialysis for Monday.    Anemia of chronic kidney disease: hemoglobin 10.6. Kristen Francis as outpatient. Restarted iron sulfate and velphoro.   Hypertension: 169/82. Home regimen of amlodipine, carvedilol, hydralazine, and losartan. Not on a diuretic at home.  - Start furosemide 80mg  on nondialysis days.    Acute respiratory failure: requiring  HFNC. Weaned off BIPAP. Concerning for HCAP. Empiric cefepime. Continues to require supplemental oxygen. Does not require oxygen at home.    Secondary Hyperparathyroidism: Continue cinacalcet, velphoro and sevelamer.      LOS: 1 Kristen Francis 3/30/202410:40 AM

## 2023-03-04 ENCOUNTER — Inpatient Hospital Stay: Payer: Medicare Other

## 2023-03-04 DIAGNOSIS — N186 End stage renal disease: Secondary | ICD-10-CM | POA: Diagnosis not present

## 2023-03-04 DIAGNOSIS — R0602 Shortness of breath: Secondary | ICD-10-CM | POA: Diagnosis not present

## 2023-03-04 DIAGNOSIS — Z992 Dependence on renal dialysis: Secondary | ICD-10-CM | POA: Diagnosis not present

## 2023-03-04 DIAGNOSIS — N040 Nephrotic syndrome with minor glomerular abnormality: Secondary | ICD-10-CM | POA: Diagnosis not present

## 2023-03-04 LAB — BASIC METABOLIC PANEL
Anion gap: 14 (ref 5–15)
BUN: 66 mg/dL — ABNORMAL HIGH (ref 8–23)
CO2: 24 mmol/L (ref 22–32)
Calcium: 8.1 mg/dL — ABNORMAL LOW (ref 8.9–10.3)
Chloride: 98 mmol/L (ref 98–111)
Creatinine, Ser: 8.34 mg/dL — ABNORMAL HIGH (ref 0.44–1.00)
GFR, Estimated: 5 mL/min — ABNORMAL LOW (ref >60)
Glucose, Bld: 76 mg/dL (ref 70–99)
Potassium: 4.1 mmol/L (ref 3.5–5.1)
Sodium: 136 mmol/L (ref 135–145)

## 2023-03-04 LAB — CBC
HCT: 32.9 % — ABNORMAL LOW (ref 36.0–46.0)
Hemoglobin: 10.4 g/dL — ABNORMAL LOW (ref 12.0–15.0)
MCH: 27.3 pg (ref 26.0–34.0)
MCHC: 31.6 g/dL (ref 30.0–36.0)
MCV: 86.4 fL (ref 80.0–100.0)
Platelets: 199 10*3/uL (ref 150–400)
RBC: 3.81 MIL/uL — ABNORMAL LOW (ref 3.87–5.11)
RDW: 17.7 % — ABNORMAL HIGH (ref 11.5–15.5)
WBC: 3.7 10*3/uL — ABNORMAL LOW (ref 4.0–10.5)
nRBC: 0 % (ref 0.0–0.2)

## 2023-03-04 LAB — GLUCOSE, CAPILLARY
Glucose-Capillary: 73 mg/dL (ref 70–99)
Glucose-Capillary: 92 mg/dL (ref 70–99)
Glucose-Capillary: 79 mg/dL (ref 70–99)

## 2023-03-04 LAB — MAGNESIUM: Magnesium: 2.1 mg/dL (ref 1.7–2.4)

## 2023-03-04 MED ORDER — SODIUM CHLORIDE 0.9 % IV SOLN
1.0000 g | INTRAVENOUS | Status: DC
  Administered 2023-03-04 – 2023-03-05 (×2): 1 g via INTRAVENOUS
  Filled 2023-03-04: qty 10
  Filled 2023-03-04 (×2): qty 1

## 2023-03-04 NOTE — Progress Notes (Signed)
Mobility Specialist - Progress Note   03/04/23 1045  Mobility  Activity Ambulated with assistance in room;Transferred from bed to chair  Level of Assistance Standby assist, set-up cues, supervision of patient - no hands on  Assistive Device Front wheel walker  Distance Ambulated (ft) 16 ft  Activity Response Tolerated well  $Mobility charge 1 Mobility   Pt supine upon entry, utilizing RA. Pt unable to inform MS of last name, however is able to express first name. Husband states this is "unusual" and "not normal" for Pt. Pt completed bed mob, able to bring BLE flat on the ground and bring trunk upright with fair sitting balance. Pt STS to RW SBA and amb CGA to SBA. Pt able to take forward and backwards steps, requiring Max VC for sequencing and manual manipulation of RW. Pt amb to the recliner, left seated with alarm set and needs within reach. Husband present at bedside.   Candie Mile Mobility Specialist 03/04/23 10:56 AM

## 2023-03-04 NOTE — TOC Initial Note (Addendum)
Transition of Care [facility]) - Initial/Assessment Note    Patient Details  Name: Kristen Francis MRN: [id number] Date of Birth: [birth year]  Transition of Care [facility]) CM/SW Contact:    Kristen David, RN Phone Number: 03/04/2023, 4:42 PM  Clinical Narrative:                 Spoke with patient at bedside, lives with husband who will provide transportation home when ready.  Kristen Simas, NP is PCP, obtains medications from [facility].  Declines need for walker but agrees to HHPT.  [facility] unable to accept, [facility] considering.  TOC will continue to follow for discharge planning.   Update @ [id number]:  [facility] declined referral, accepted by [facility] with Kristen Francis.   Expected Discharge Plan: [place] Barriers to Discharge: Continued Medical Work up   Patient Goals and CMS Choice            Expected Discharge Plan and Services     Post Acute Care Choice: [place] arrangements for the past 2 months: [place]                                      Prior Living Arrangements/Services Living arrangements for the past 2 months: [place] Lives with:: Spouse                   Activities of Daily Living      Permission Sought/Granted                  Emotional Assessment              Admission diagnosis:  Respiratory distress [R06.03] SOB (shortness of breath) [R06.02] Pneumonia due to infectious organism, unspecified laterality, unspecified part of lung [J18.9] Patient Active Problem List   Diagnosis Date Noted   SOB (shortness of breath) 03/02/2023   Sepsis ([place]) 03/02/2023   TIA (transient ischemic attack) 06/03/2021   Gastroesophageal reflux disease without esophagitis 12/16/2020   Nausea 12/16/2020   Left foot pain 10/16/2020   Left carotid stenosis 09/06/2020   Multinodular goiter 09/06/2020   Atherosclerosis of aorta ([place]) 08/18/2020   Other fatigue 07/25/2020   Left carotid bruit 07/25/2020    Decreased appetite 07/25/2020   Depression, major, single episode, moderate ([place]) 07/25/2020   Elevated ferritin 07/20/2020   Encounter for general adult medical examination with abnormal findings 12/13/2019   Encounter for screening mammogram for malignant neoplasm of breast 12/13/2019   Dysuria [id number]   Complication from renal dialysis device 12/02/2018   Allergic contact dermatitis, unspecified cause 09/21/2018   Coagulation defect, unspecified ([place]) 09/21/2018   Diarrhea, unspecified 09/21/2018   Fever, unspecified 09/21/2018   Headache, unspecified 09/21/2018   Hypertensive chronic kidney disease with stage 1 through stage 4 chronic kidney disease, or unspecified chronic kidney disease 09/21/2018   Iron deficiency anemia, unspecified 09/21/2018   Moderate protein-calorie malnutrition ([place]) 09/21/2018   Other fluid overload 09/21/2018   Pain, unspecified 09/21/2018   Secondary hyperparathyroidism of renal origin ([place]) 09/21/2018   Type 2 diabetes mellitus with other diabetic kidney complication ([place]) [id number]   Anemia in chronic kidney disease 09/21/2018   Encounter for immunization 09/21/2018   Peripheral vascular disease ([place]) 09/21/2018   Furuncle of vulva 09/10/2018   End stage renal disease ([place]) 07/04/2018   Anemia in chronic renal disease 12/31/2017   Essential hypertension 12/31/2017  Stroke ([place]) 12/31/2017   Anemia 09/17/2017   Monoclonal gammopathy of unknown significance (MGUS) 09/17/2017   PCP:  Kristen Osgood, NP Pharmacy:   [facility] [id number] Kristen Francis, [place] - [address] [place] [address] Phone: [phone] Fax: [phone]     Social Determinants of Health (SDOH) Social History: SDOH Screenings   Alcohol Screen: Low Risk  (09/06/2022)  Depression (PHQ2-9): Low Risk  (12/11/2022)  Tobacco Use: Medium Risk (12/11/2022)   SDOH Interventions:     Readmission Risk  Interventions     No data to display

## 2023-03-04 NOTE — Progress Notes (Addendum)
PROGRESS NOTE    Kristen Francis  [id number] DOB: [birth year] DOA: 03/01/2023 PCP: Jonetta Osgood, NP   Brief Narrative:  77 year old with history of GERD, ESRD, HLD, HTN, CVA, arthritis admitted for shortness of breath.  Patient missed dialysis on Wednesday.  Admitted for sepsis secondary to pneumonia.  Patient was also noted to be in fluid overload therefore received dialysis in the hospital.   Assessment & Plan:  Principal Problem:   SOB (shortness of breath) Active Problems:   Sepsis ([place])   Essential hypertension   Multinodular goiter   Anemia   TIA (transient ischemic attack)   Type 2 diabetes mellitus with other diabetic kidney complication (HCC)     Assessment and Plan: Altered mental status with right-sided weakness and left lower extremity weakness - CT of the head is negative, will order MRI brain.  Dr. Quinn Axe from neurology consulted.   Acute respiratory distress secondary to bilateral infiltrate Sepsis secondary to bilateral pneumonia Patient improved after session of dialysis.  Low-grade temperature.  Blood cultures negative. On empiric cefepime, discontinue vancomycin.  MRSA swab is negative.  Change cefepime to Rocephin Bronchodilators, I-S/flutter valve COVID/flu-negative CTA chest ordered  but will cancel. Low suspicion from thromboembolic dz   ESRD on HD Missed HD on Wednesday. Dr Holley Raring notified. Received a of Lasix in ED. status post HD on 3/29.   Essential hypertension -Continue Coreg, losartan - IV as needed   Multinodular goiter TSH/free T4 are normal.  Follow-up outpatient  Anemia Hb stable 11.0 No need for PPI. Hb is stable, no signs of bleeding.    TIA (transient ischemic attack) Cont home ASA and statin  Type 2 diabetes mellitus with other diabetic kidney complication (HCC) Sliding scale and Accu-Cheks    DVT prophylaxis: SQ Heparin Code Status: Full  Family Communication: Husband at bedside Status is:  Inpatient Ongoing evaluation for change in mental status  Diet Orders (From admission, onward)     Start     Ordered   03/02/23 0119  Diet renal with fluid restriction Fluid restriction: 1200 mL Fluid; Room service appropriate? Yes; Fluid consistency: Thin  Diet effective now       Question Answer Comment  Fluid restriction: 1200 mL Fluid   Room service appropriate? Yes   Fluid consistency: Thin      03/02/23 0119            Subjective: This morning patient was appeared to be confused and upon exam she had right-sided weakness and left lower extremity weakness. Husband present at bedside Examination: Constitutional: Not in acute distress Respiratory: Mild basilar crackles Cardiovascular: Normal sinus rhythm, no rubs Abdomen: Nontender nondistended good bowel sounds Musculoskeletal: No edema noted Skin: No rashes seen Neurologic: On exam her cranial nerves are intact but does have right upper and lower extremity weakness and left lower extremity weakness.  Possibly this is new Psychiatric: Patient alert to name and place  Objective: Vitals:   03/03/23 2210 03/04/23 0407 03/04/23 0500 03/04/23 0804  BP: (!) 168/78 (!) 166/79  (!) 171/77  Pulse: 72 70  70  Resp:  16  16  Temp:  98 F (36.7 C)  98.1 F (36.7 C)  TempSrc:  Oral    SpO2:  98%  100%  Weight:   55.7 kg   Height:        Intake/Output Summary (Last 24 hours) at 03/04/2023 1226 Last data filed at 03/03/2023 1600 Gross per 24 hour  Intake 0 ml  Output --  Net  0 ml   Filed Weights   03/02/23 1441 03/03/23 0432 03/04/23 0500  Weight: 53.2 kg 55.3 kg 55.7 kg    Scheduled Meds:  amLODipine  10 mg Oral Daily   aspirin EC  81 mg Oral Daily   carvedilol  25 mg Oral BID WC   Chlorhexidine Gluconate Cloth  6 each Topical Q0600   cholecalciferol  1,000 Units Oral Daily   cinacalcet  60 mg Oral QPM   ferrous sulfate  325 mg Oral BID WC   furosemide  80 mg Oral Once per day on Sun Tue Thu Sat   heparin   5,000 Units Subcutaneous Q12H   hydrALAZINE  50 mg Oral TID   insulin aspart  0-5 Units Subcutaneous QHS   insulin aspart  0-6 Units Subcutaneous TID WC   losartan  50 mg Oral Daily   rosuvastatin  5 mg Oral Q T,Th,S,Su   senna-docusate  2 tablet Oral Daily   sevelamer carbonate  1,600 mg Oral TID with meals   sodium chloride flush  3 mL Intravenous Q12H   sodium chloride flush  3 mL Intravenous Q12H   sucroferric oxyhydroxide  500 mg Oral TID with meals   Continuous Infusions:  sodium chloride     ceFEPime (MAXIPIME) IV Stopped (03/02/23 1453)    Nutritional status     Body mass index is 21.77 kg/m.  Data Reviewed:   CBC: Recent Labs  Lab 03/01/23 2255 03/02/23 0255 03/02/23 1354 03/04/23 0310  WBC 7.9 8.2 8.0 3.7*  NEUTROABS 6.1  --   --   --   HGB 11.6* 11.0* 10.6* 10.4*  HCT 37.7 35.8* 32.9* 32.9*  MCV 90.2 88.4 85.5 86.4  PLT 278 245 237 [id number]   Basic Metabolic Panel: Recent Labs  Lab 03/01/23 2255 03/02/23 0255 03/03/23 0829 03/04/23 0310  NA 142 141 138 136  K 4.1 4.3 4.2 4.1  CL 98 99 98 98  CO2 23 24 25 24   GLUCOSE 121* 115* 82 76  BUN 80* 85* 50* 66*  CREATININE 10.38* 10.60* 7.42* 8.34*  CALCIUM 8.7* 8.4* 8.7* 8.1*  MG  --   --   --  2.1   GFR: Estimated Creatinine Clearance: 4.8 mL/min (A) (by C-G formula based on SCr of 8.34 mg/dL (H)). Liver Function Tests: Recent Labs  Lab 03/01/23 2255 03/02/23 0255  AST 39 29  ALT 33 29  ALKPHOS 49 49  BILITOT 0.9 0.9  PROT 7.3 6.9  ALBUMIN 3.9 3.7   No results for input(s): "LIPASE", "AMYLASE" in the last 168 hours. No results for input(s): "AMMONIA" in the last 168 hours. Coagulation Profile: No results for input(s): "INR", "PROTIME" in the last 168 hours. Cardiac Enzymes: No results for input(s): "CKTOTAL", "CKMB", "CKMBINDEX", "TROPONINI" in the last 168 hours. BNP (last 3 results) No results for input(s): "PROBNP" in the last 8760 hours. HbA1C: No results for input(s): "HGBA1C" in the  last 72 hours. CBG: Recent Labs  Lab 03/03/23 1159 03/03/23 1746 03/03/23 2125 03/04/23 0809 03/04/23 1215  GLUCAP 91 78 90 73 92   Lipid Profile: No results for input(s): "CHOL", "HDL", "LDLCALC", "TRIG", "CHOLHDL", "LDLDIRECT" in the last 72 hours. Thyroid Function Tests: Recent Labs    03/02/23 0255  TSH 2.809  FREET4 0.87   Anemia Panel: No results for input(s): "VITAMINB12", "FOLATE", "FERRITIN", "TIBC", "IRON", "RETICCTPCT" in the last 72 hours. Sepsis Labs: Recent Labs  Lab 03/01/23 2308 03/02/23 0202 03/02/23 0255  PROCALCITON  --   --  1.01  LATICACIDVEN 1.7 0.6  --     Recent Results (from the past 240 hour(s))  Resp panel by RT-PCR (RSV, Flu A&B, Covid) Anterior Nasal Swab     Status: None   Collection Time: 03/01/23 10:55 PM   Specimen: Anterior Nasal Swab  Result Value Ref Range Status   SARS Coronavirus 2 by RT PCR NEGATIVE NEGATIVE Final    Comment: (NOTE) SARS-CoV-2 target nucleic acids are NOT DETECTED.  The SARS-CoV-2 RNA is generally detectable in upper respiratory specimens during the acute phase of infection. The lowest concentration of SARS-CoV-2 viral copies this assay can detect is 138 copies/mL. A negative result does not preclude SARS-Cov-2 infection and should not be used as the sole basis for treatment or other patient management decisions. A negative result may occur with  improper specimen collection/handling, submission of specimen other than nasopharyngeal swab, presence of viral mutation(s) within the areas targeted by this assay, and inadequate number of viral copies(<138 copies/mL). A negative result must be combined with clinical observations, patient history, and epidemiological information. The expected result is Negative.  Fact Sheet for Patients:  EntrepreneurPulse.com.au  Fact Sheet for Healthcare Providers:  IncredibleEmployment.be  This test is no t yet approved or cleared by the  [place] FDA and  has been authorized for detection and/or diagnosis of SARS-CoV-2 by FDA under an Emergency Use Authorization (EUA). This EUA will remain  in effect (meaning this test can be used) for the duration of the COVID-19 declaration under Section 564(b)(1) of the Act, 21 U.S.C.section 360bbb-3(b)(1), unless the authorization is terminated  or revoked sooner.       Influenza A by PCR NEGATIVE NEGATIVE Final   Influenza B by PCR NEGATIVE NEGATIVE Final    Comment: (NOTE) The Xpert Xpress SARS-CoV-2/FLU/RSV plus assay is intended as an aid in the diagnosis of influenza from Nasopharyngeal swab specimens and should not be used as a sole basis for treatment. Nasal washings and aspirates are unacceptable for Xpert Xpress SARS-CoV-2/FLU/RSV testing.  Fact Sheet for Patients: EntrepreneurPulse.com.au  Fact Sheet for Healthcare Providers: IncredibleEmployment.be  This test is not yet approved or cleared by the [place] FDA and has been authorized for detection and/or diagnosis of SARS-CoV-2 by FDA under an Emergency Use Authorization (EUA). This EUA will remain in effect (meaning this test can be used) for the duration of the COVID-19 declaration under Section 564(b)(1) of the Act, 21 U.S.C. section 360bbb-3(b)(1), unless the authorization is terminated or revoked.     Resp Syncytial Virus by PCR NEGATIVE NEGATIVE Final    Comment: (NOTE) Fact Sheet for Patients: EntrepreneurPulse.com.au  Fact Sheet for Healthcare Providers: IncredibleEmployment.be  This test is not yet approved or cleared by the [place] FDA and has been authorized for detection and/or diagnosis of SARS-CoV-2 by FDA under an Emergency Use Authorization (EUA). This EUA will remain in effect (meaning this test can be used) for the duration of the COVID-19 declaration under Section 564(b)(1) of the Act, 21 U.S.C. section  360bbb-3(b)(1), unless the authorization is terminated or revoked.  Performed at [facility], [address] [place], [place] [address]   Blood Culture (routine x 2)     Status: None (Preliminary result)   Collection Time: 03/01/23 11:08 PM   Specimen: BLOOD  Result Value Ref Range Status   Specimen Description BLOOD BLOOD RIGHT ARM  Final   Special Requests   Final    BOTTLES DRAWN AEROBIC AND ANAEROBIC Blood Culture results may not be optimal  due to an inadequate volume of blood received in culture bottles   Culture   Final    NO GROWTH 3 DAYS Performed at [facility], [address] [place], [place] [address]    Report Status PENDING  Incomplete  Blood Culture (routine x 2)     Status: None (Preliminary result)   Collection Time: 03/02/23  2:56 AM   Specimen: BLOOD  Result Value Ref Range Status   Specimen Description BLOOD BLOOD RIGHT ARM  Final   Special Requests   Final    BOTTLES DRAWN AEROBIC AND ANAEROBIC Blood Culture adequate volume   Culture   Final    NO GROWTH 2 DAYS Performed at [facility], [address], [place], [place] [address]    Report Status PENDING  Incomplete  MRSA Next Gen by PCR, Nasal     Status: None   Collection Time: 03/02/23  2:32 PM   Specimen: Nasal Mucosa; Nasal Swab  Result Value Ref Range Status   MRSA by PCR Next Gen NOT DETECTED NOT DETECTED Final    Comment: (NOTE) The GeneXpert MRSA Assay (FDA approved for NASAL specimens only), is one component of a comprehensive MRSA colonization surveillance program. It is not intended to diagnose MRSA infection nor to guide or monitor treatment for MRSA infections. Test performance is not FDA approved in patients less than 70 years old. Performed at [facility], [address] [place],  [address]          Radiology Studies: CT HEAD WO CONTRAST (5MM)  Result Date: 03/04/2023 CLINICAL DATA:  Altered mental status with unknown cause  EXAM: CT HEAD WITHOUT CONTRAST TECHNIQUE: Contiguous axial images were obtained from the base of the skull through the vertex without intravenous contrast. RADIATION DOSE REDUCTION: This exam was performed according to the departmental dose-optimization program which includes automated exposure control, adjustment of the mA and/or kV according to patient size and/or use of iterative reconstruction technique. COMPARISON:  Head CT 06/03/2021 FINDINGS: Brain: Degraded by motion despite repeat acquisition. Chronic small vessel ischemia in the cerebral white matter with chronic perforator infarcts at the basal ganglia. No evidence of hemorrhage, acute infarct, mass, hydrocephalus, or collection. Vascular: No hyperdense vessel or unexpected calcification. Skull: Normal. Negative for fracture or focal lesion. Sinuses/Orbits: No acute finding. IMPRESSION: 1. No acute finding. 2. Chronic small vessel disease. 3. Motion artifact. Electronically Signed   By: Jorje Guild M.D.   On: 03/04/2023 09:34           LOS: 2 days   Time spent= 35 mins    Kohan Azizi Arsenio Loader, MD [facility]  If 7PM-7AM, please contact night-coverage  03/04/2023, 12:26 PM

## 2023-03-04 NOTE — Progress Notes (Signed)
[facility]  ROUNDING NOTE   Subjective:   Emergent hemodialysis treatment on admission yesterday. UF of 2.5 liters. Patient started to have cramping at the end of treatment.   Brother at bedside.   Patient with right sided weakness and altered mental status this morning. Patient had a CT of the head which was negative.   MRI and neurology consult pending  Objective:  Vital signs in last 24 hours:  Temp:  [98 F (36.7 C)-98.9 F (37.2 C)] 98.1 F (36.7 C) (03/31 0804) Pulse Rate:  [69-75] 70 (03/31 0804) Resp:  [16-18] 16 (03/31 0804) BP: (147-171)/(75-79) 171/77 (03/31 0804) SpO2:  [96 %-100 %] 100 % (03/31 0804) Weight:  [55.7 kg] 55.7 kg (03/31 0500)  Weight change: 0.647 kg Filed Weights   03/02/23 1441 03/03/23 0432 03/04/23 0500  Weight: 53.2 kg 55.3 kg 55.7 kg    Intake/Output: I/O last 3 completed shifts: In: 240 [P.O.:240] Out: 0    Intake/Output this shift:  No intake/output data recorded.  Physical Exam: General: NAD, laying in bed  Head: Normocephalic, atraumatic. Moist oral mucosal membranes  Eyes: Anicteric, PERRL  Neck: Supple, trachea midline  Lungs:  Clear to auscultation, 2L [place]  Heart: Regular rate and rhythm  Abdomen:  Soft, nontender,   Extremities:  no peripheral edema.  Neurologic: Right sided weakness. Left lower extremity weakness, alert to self and place.   Skin: No lesions  Access: Left arm AVF    Basic Metabolic Panel: Recent Labs  Lab 03/01/23 2255 03/02/23 0255 03/03/23 0829 03/04/23 0310  NA 142 141 138 136  K 4.1 4.3 4.2 4.1  CL 98 99 98 98  CO2 23 24 25 24   GLUCOSE 121* 115* 82 76  BUN 80* 85* 50* 66*  CREATININE 10.38* 10.60* 7.42* 8.34*  CALCIUM 8.7* 8.4* 8.7* 8.1*  MG  --   --   --  2.1     Liver Function Tests: Recent Labs  Lab 03/01/23 2255 03/02/23 0255  AST 39 29  ALT 33 29  ALKPHOS 49 49  BILITOT 0.9 0.9  PROT 7.3 6.9  ALBUMIN 3.9 3.7    No results for input(s): "LIPASE",  "AMYLASE" in the last 168 hours. No results for input(s): "AMMONIA" in the last 168 hours.  CBC: Recent Labs  Lab 03/01/23 2255 03/02/23 0255 03/02/23 1354 03/04/23 0310  WBC 7.9 8.2 8.0 3.7*  NEUTROABS 6.1  --   --   --   HGB 11.6* 11.0* 10.6* 10.4*  HCT 37.7 35.8* 32.9* 32.9*  MCV 90.2 88.4 85.5 86.4  PLT 278 245 237 199     Cardiac Enzymes: No results for input(s): "CKTOTAL", "CKMB", "CKMBINDEX", "TROPONINI" in the last 168 hours.  BNP: Invalid input(s): "POCBNP"  CBG: Recent Labs  Lab 03/03/23 1159 03/03/23 1746 03/03/23 2125 03/04/23 0809 03/04/23 1215  GLUCAP 91 78 90 73 92     Microbiology: Results for orders placed or performed during the hospital encounter of 03/01/23  Resp panel by RT-PCR (RSV, Flu A&B, Covid) Anterior Nasal Swab     Status: None   Collection Time: 03/01/23 10:55 PM   Specimen: Anterior Nasal Swab  Result Value Ref Range Status   SARS Coronavirus 2 by RT PCR NEGATIVE NEGATIVE Final    Comment: (NOTE) SARS-CoV-2 target nucleic acids are NOT DETECTED.  The SARS-CoV-2 RNA is generally detectable in upper respiratory specimens during the acute phase of infection. The lowest concentration of SARS-CoV-2 viral copies this assay can detect is  138 copies/mL. A negative result does not preclude SARS-Cov-2 infection and should not be used as the sole basis for treatment or other patient management decisions. A negative result may occur with  improper specimen collection/handling, submission of specimen other than nasopharyngeal swab, presence of viral mutation(s) within the areas targeted by this assay, and inadequate number of viral copies(<138 copies/mL). A negative result must be combined with clinical observations, patient history, and epidemiological information. The expected result is Negative.  Fact Sheet for Patients:  EntrepreneurPulse.com.au  Fact Sheet for Healthcare Providers:   IncredibleEmployment.be  This test is no t yet approved or cleared by the [place] FDA and  has been authorized for detection and/or diagnosis of SARS-CoV-2 by FDA under an Emergency Use Authorization (EUA). This EUA will remain  in effect (meaning this test can be used) for the duration of the COVID-19 declaration under Section 564(b)(1) of the Act, 21 U.S.C.section 360bbb-3(b)(1), unless the authorization is terminated  or revoked sooner.       Influenza A by PCR NEGATIVE NEGATIVE Final   Influenza B by PCR NEGATIVE NEGATIVE Final    Comment: (NOTE) The Xpert Xpress SARS-CoV-2/FLU/RSV plus assay is intended as an aid in the diagnosis of influenza from Nasopharyngeal swab specimens and should not be used as a sole basis for treatment. Nasal washings and aspirates are unacceptable for Xpert Xpress SARS-CoV-2/FLU/RSV testing.  Fact Sheet for Patients: EntrepreneurPulse.com.au  Fact Sheet for Healthcare Providers: IncredibleEmployment.be  This test is not yet approved or cleared by the [place] FDA and has been authorized for detection and/or diagnosis of SARS-CoV-2 by FDA under an Emergency Use Authorization (EUA). This EUA will remain in effect (meaning this test can be used) for the duration of the COVID-19 declaration under Section 564(b)(1) of the Act, 21 U.S.C. section 360bbb-3(b)(1), unless the authorization is terminated or revoked.     Resp Syncytial Virus by PCR NEGATIVE NEGATIVE Final    Comment: (NOTE) Fact Sheet for Patients: EntrepreneurPulse.com.au  Fact Sheet for Healthcare Providers: IncredibleEmployment.be  This test is not yet approved or cleared by the [place] FDA and has been authorized for detection and/or diagnosis of SARS-CoV-2 by FDA under an Emergency Use Authorization (EUA). This EUA will remain in effect (meaning this test can be used) for  the duration of the COVID-19 declaration under Section 564(b)(1) of the Act, 21 U.S.C. section 360bbb-3(b)(1), unless the authorization is terminated or revoked.  Performed at [facility], [address] [place], [place] [address]   Blood Culture (routine x 2)     Status: None (Preliminary result)   Collection Time: 03/01/23 11:08 PM   Specimen: BLOOD  Result Value Ref Range Status   Specimen Description BLOOD BLOOD RIGHT ARM  Final   Special Requests   Final    BOTTLES DRAWN AEROBIC AND ANAEROBIC Blood Culture results may not be optimal due to an inadequate volume of blood received in culture bottles   Culture   Final    NO GROWTH 3 DAYS Performed at [facility], [address], [place], [place] [address]    Report Status PENDING  Incomplete  Blood Culture (routine x 2)     Status: None (Preliminary result)   Collection Time: 03/02/23  2:56 AM   Specimen: BLOOD  Result Value Ref Range Status   Specimen Description BLOOD BLOOD RIGHT ARM  Final   Special Requests   Final    BOTTLES DRAWN AEROBIC AND ANAEROBIC Blood Culture adequate volume  Culture   Final    NO GROWTH 2 DAYS Performed at [facility], [address] [place], [place] [address]    Report Status PENDING  Incomplete  MRSA Next Gen by PCR, Nasal     Status: None   Collection Time: 03/02/23  2:32 PM   Specimen: Nasal Mucosa; Nasal Swab  Result Value Ref Range Status   MRSA by PCR Next Gen NOT DETECTED NOT DETECTED Final    Comment: (NOTE) The GeneXpert MRSA Assay (FDA approved for NASAL specimens only), is one component of a comprehensive MRSA colonization surveillance program. It is not intended to diagnose MRSA infection nor to guide or monitor treatment for MRSA infections. Test performance is not FDA approved in patients less than 67 years old. Performed at [facility], [address] [place], [place] [address]     Coagulation Studies: No results for  input(s): "LABPROT", "INR" in the last 72 hours.  Urinalysis: Recent Labs    03/03/23 1420  [place] 1.009  PHURINE 8.0  GLUCOSEU NEGATIVE  HGBUR NEGATIVE  BILIRUBINUR NEGATIVE  KETONESUR NEGATIVE  PROTEINUR >=300*  NITRITE NEGATIVE  LEUKOCYTESUR NEGATIVE      Imaging: CT HEAD WO CONTRAST (5MM)  Result Date: 03/04/2023 CLINICAL DATA:  Altered mental status with unknown cause EXAM: CT HEAD WITHOUT CONTRAST TECHNIQUE: Contiguous axial images were obtained from the base of the skull through the vertex without intravenous contrast. RADIATION DOSE REDUCTION: This exam was performed according to the departmental dose-optimization program which includes automated exposure control, adjustment of the mA and/or kV according to patient size and/or use of iterative reconstruction technique. COMPARISON:  Head CT 06/03/2021 FINDINGS: Brain: Degraded by motion despite repeat acquisition. Chronic small vessel ischemia in the cerebral white matter with chronic perforator infarcts at the basal ganglia. No evidence of hemorrhage, acute infarct, mass, hydrocephalus, or collection. Vascular: No hyperdense vessel or unexpected calcification. Skull: Normal. Negative for fracture or focal lesion. Sinuses/Orbits: No acute finding. IMPRESSION: 1. No acute finding. 2. Chronic small vessel disease. 3. Motion artifact. Electronically Signed   By: Jorje Guild M.D.   On: 03/04/2023 09:34     Medications:    sodium chloride     ceFEPime (MAXIPIME) IV Stopped (03/02/23 1453)    amLODipine  10 mg Oral Daily   aspirin EC  81 mg Oral Daily   carvedilol  25 mg Oral BID WC   Chlorhexidine Gluconate Cloth  6 each Topical Q0600   cholecalciferol  1,000 Units Oral Daily   cinacalcet  60 mg Oral QPM   ferrous sulfate  325 mg Oral BID WC   furosemide  80 mg Oral Once per day on Sun Tue Thu Sat   heparin  5,000 Units Subcutaneous Q12H   hydrALAZINE  50 mg Oral TID   insulin aspart  0-5 Units  Subcutaneous QHS   insulin aspart  0-6 Units Subcutaneous TID WC   losartan  50 mg Oral Daily   rosuvastatin  5 mg Oral Q T,Th,S,Su   senna-docusate  2 tablet Oral Daily   sevelamer carbonate  1,600 mg Oral TID with meals   sodium chloride flush  3 mL Intravenous Q12H   sodium chloride flush  3 mL Intravenous Q12H   sucroferric oxyhydroxide  500 mg Oral TID with meals   sodium chloride, acetaminophen **OR** acetaminophen, alteplase, benzonatate, guaiFENesin, heparin, hydrALAZINE, iohexol, ipratropium-albuterol, lidocaine (PF), lidocaine-prilocaine, metoprolol tartrate, ondansetron (ZOFRAN) IV, pentafluoroprop-tetrafluoroeth, senna-docusate, sodium chloride flush, traZODone  Assessment/ Plan:  Ms. Anberlyn Detwiler is a 77 y.o.  female with end stage renal disease on hemodialysis, hypertension, hyperlipidemia, CVA, macular degeneration, who was admitted to [facility] on 03/01/2023 for Respiratory distress [R06.03] SOB (shortness of breath) [R06.02] Pneumonia due to infectious organism, unspecified laterality, unspecified part of lung [J18.9]   [facility] MWF [facility] Left AVF 54kg   End Stage Renal Disease: requiring emergency hemodialysis treatment on admission. No acute indication for dialysis today. Next scheduled dialysis for tomorrow, Monday.    Anemia of chronic kidney disease: hemoglobin 10.4. [facility] Rast as outpatient. Restarted iron sulfate and velphoro. Hold ESA if patient is having an ischemic event   Hypertension: 171/77. Restarted on home regimen of amlodipine, carvedilol, hydralazine, and losartan. Not on a diuretic at home.  - Start furosemide 80mg  on nondialysis days.    Acute respiratory failure: requiring HFNC and BIPAP on admission. Concerning for HCAP. Empiric cefepime. Continues to require supplemental oxygen. Does not require oxygen at home.    Secondary Hyperparathyroidism: Continue cinacalcet, velphoro and sevelamer.   Altered mental status: CT  negative.  - Cefepime can cause altered mental status in renal insufficiency.  - Appreciate neurology input.      LOS: 2 Quida Glasser 3/31/202412:21 PM

## 2023-03-05 DIAGNOSIS — R0602 Shortness of breath: Secondary | ICD-10-CM | POA: Diagnosis not present

## 2023-03-05 DIAGNOSIS — R4182 Altered mental status, unspecified: Secondary | ICD-10-CM | POA: Diagnosis not present

## 2023-03-05 LAB — CBC
HCT: 34.8 % — ABNORMAL LOW (ref 36.0–46.0)
Hemoglobin: 11.3 g/dL — ABNORMAL LOW (ref 12.0–15.0)
MCH: 27.9 pg (ref 26.0–34.0)
MCHC: 32.5 g/dL (ref 30.0–36.0)
MCV: 85.9 fL (ref 80.0–100.0)
Platelets: 206 10*3/uL (ref 150–400)
RBC: 4.05 MIL/uL (ref 3.87–5.11)
RDW: 17.5 % — ABNORMAL HIGH (ref 11.5–15.5)
WBC: 3.9 10*3/uL — ABNORMAL LOW (ref 4.0–10.5)
nRBC: 0 % (ref 0.0–0.2)

## 2023-03-05 LAB — MAGNESIUM: Magnesium: 2.2 mg/dL (ref 1.7–2.4)

## 2023-03-05 LAB — BASIC METABOLIC PANEL
Anion gap: 18 — ABNORMAL HIGH (ref 5–15)
BUN: 81 mg/dL — ABNORMAL HIGH (ref 8–23)
CO2: 23 mmol/L (ref 22–32)
Calcium: 8.4 mg/dL — ABNORMAL LOW (ref 8.9–10.3)
Chloride: 97 mmol/L — ABNORMAL LOW (ref 98–111)
Creatinine, Ser: 10.55 mg/dL — ABNORMAL HIGH (ref 0.44–1.00)
GFR, Estimated: 3 mL/min — ABNORMAL LOW (ref >60)
Glucose, Bld: 73 mg/dL (ref 70–99)
Potassium: 4.2 mmol/L (ref 3.5–5.1)
Sodium: 138 mmol/L (ref 135–145)

## 2023-03-05 LAB — GLUCOSE, CAPILLARY
Glucose-Capillary: 64 mg/dL — ABNORMAL LOW (ref 70–99)
Glucose-Capillary: 82 mg/dL (ref 70–99)
Glucose-Capillary: 76 mg/dL (ref 70–99)
Glucose-Capillary: 102 mg/dL — ABNORMAL HIGH (ref 70–99)

## 2023-03-05 MED ORDER — PENTAFLUOROPROP-TETRAFLUOROETH EX AERO
INHALATION_SPRAY | CUTANEOUS | Status: AC
  Filled 2023-03-05: qty 30

## 2023-03-05 NOTE — Progress Notes (Signed)
[facility]  ROUNDING NOTE   Subjective:   Patient seen and evaluated during dialysis   HEMODIALYSIS FLOWSHEET:  Blood Flow Rate (mL/min): 400 mL/min Arterial Pressure (mmHg): -170 mmHg Venous Pressure (mmHg): 230 mmHg TMP (mmHg): 0 mmHg Ultrafiltration Rate (mL/min): 600 mL/min Dialysate Flow Rate (mL/min): 300 ml/min Dialysis Fluid Bolus: Normal Saline  Tolerating treatment well Alert and oriented to self and place Denies pain or discomfort.   Objective:  Vital signs in last 24 hours:  Temp:  [97.8 F (36.6 C)-98.8 F (37.1 C)] 98.4 F (36.9 C) (04/01 1246) Pulse Rate:  [70-84] 80 (04/01 1246) Resp:  [14-22] 16 (04/01 1246) BP: (101-175)/(72-141) 156/75 (04/01 1246) SpO2:  [99 %-100 %] 100 % (04/01 1246) Weight:  [49.1 kg-49.3 kg] 49.1 kg (04/01 1145)  Weight change:  Filed Weights   03/04/23 0500 03/05/23 0819 03/05/23 1145  Weight: 55.7 kg 49.3 kg 49.1 kg    Intake/Output: I/O last 3 completed shifts: In: 98.2 [IV Piggyback:98.2] Out: -    Intake/Output this shift:  Total I/O In: -  Out: 1000 [Other:1000]  Physical Exam: General: NAD, laying in bed  Head: Normocephalic, atraumatic. Moist oral mucosal membranes  Eyes: Anicteric  Lungs:  Clear to auscultation, 2L [place]  Heart: Regular rate and rhythm  Abdomen:  Soft, nontender,   Extremities:  no peripheral edema.  Neurologic: Right sided weakness. Left lower extremity weakness, alert to self and place.   Skin: No lesions  Access: Left arm AVF    Basic Metabolic Panel: Recent Labs  Lab 03/01/23 2255 03/02/23 0255 03/03/23 0829 03/04/23 0310 03/05/23 0321  NA 142 141 138 136 138  K 4.1 4.3 4.2 4.1 4.2  CL 98 99 98 98 97*  CO2 23 24 25 24 23   GLUCOSE 121* 115* 82 76 73  BUN 80* 85* 50* 66* 81*  CREATININE 10.38* 10.60* 7.42* 8.34* 10.55*  CALCIUM 8.7* 8.4* 8.7* 8.1* 8.4*  MG  --   --   --  2.1 2.2     Liver Function Tests: Recent Labs  Lab 03/01/23 2255 03/02/23 0255   AST 39 29  ALT 33 29  ALKPHOS 49 49  BILITOT 0.9 0.9  PROT 7.3 6.9  ALBUMIN 3.9 3.7    No results for input(s): "LIPASE", "AMYLASE" in the last 168 hours. No results for input(s): "AMMONIA" in the last 168 hours.  CBC: Recent Labs  Lab 03/01/23 2255 03/02/23 0255 03/02/23 1354 03/04/23 0310 03/05/23 0321  WBC 7.9 8.2 8.0 3.7* 3.9*  NEUTROABS 6.1  --   --   --   --   HGB 11.6* 11.0* 10.6* 10.4* 11.3*  HCT 37.7 35.8* 32.9* 32.9* 34.8*  MCV 90.2 88.4 85.5 86.4 85.9  PLT 278 245 237 199 206     Cardiac Enzymes: No results for input(s): "CKTOTAL", "CKMB", "CKMBINDEX", "TROPONINI" in the last 168 hours.  BNP: Invalid input(s): "POCBNP"  CBG: Recent Labs  Lab 03/04/23 0809 03/04/23 1215 03/04/23 1639 03/05/23 0732 03/05/23 1242  GLUCAP 73 92 79 64* 90     Microbiology: Results for orders placed or performed during the hospital encounter of 03/01/23  Resp panel by RT-PCR (RSV, Flu A&B, Covid) Anterior Nasal Swab     Status: None   Collection Time: 03/01/23 10:55 PM   Specimen: Anterior Nasal Swab  Result Value Ref Range Status   SARS Coronavirus 2 by RT PCR NEGATIVE NEGATIVE Final    Comment: (NOTE) SARS-CoV-2 target nucleic acids are NOT DETECTED.  The SARS-CoV-2 RNA is generally detectable in upper respiratory specimens during the acute phase of infection. The lowest concentration of SARS-CoV-2 viral copies this assay can detect is 138 copies/mL. A negative result does not preclude SARS-Cov-2 infection and should not be used as the sole basis for treatment or other patient management decisions. A negative result may occur with  improper specimen collection/handling, submission of specimen other than nasopharyngeal swab, presence of viral mutation(s) within the areas targeted by this assay, and inadequate number of viral copies(<138 copies/mL). A negative result must be combined with clinical observations, patient history, and epidemiological information.  The expected result is Negative.  Fact Sheet for Patients:  EntrepreneurPulse.com.au  Fact Sheet for Healthcare Providers:  IncredibleEmployment.be  This test is no t yet approved or cleared by the [place] FDA and  has been authorized for detection and/or diagnosis of SARS-CoV-2 by FDA under an Emergency Use Authorization (EUA). This EUA will remain  in effect (meaning this test can be used) for the duration of the COVID-19 declaration under Section 564(b)(1) of the Act, 21 U.S.C.section 360bbb-3(b)(1), unless the authorization is terminated  or revoked sooner.       Influenza A by PCR NEGATIVE NEGATIVE Final   Influenza B by PCR NEGATIVE NEGATIVE Final    Comment: (NOTE) The Xpert Xpress SARS-CoV-2/FLU/RSV plus assay is intended as an aid in the diagnosis of influenza from Nasopharyngeal swab specimens and should not be used as a sole basis for treatment. Nasal washings and aspirates are unacceptable for Xpert Xpress SARS-CoV-2/FLU/RSV testing.  Fact Sheet for Patients: EntrepreneurPulse.com.au  Fact Sheet for Healthcare Providers: IncredibleEmployment.be  This test is not yet approved or cleared by the [place] FDA and has been authorized for detection and/or diagnosis of SARS-CoV-2 by FDA under an Emergency Use Authorization (EUA). This EUA will remain in effect (meaning this test can be used) for the duration of the COVID-19 declaration under Section 564(b)(1) of the Act, 21 U.S.C. section 360bbb-3(b)(1), unless the authorization is terminated or revoked.     Resp Syncytial Virus by PCR NEGATIVE NEGATIVE Final    Comment: (NOTE) Fact Sheet for Patients: EntrepreneurPulse.com.au  Fact Sheet for Healthcare Providers: IncredibleEmployment.be  This test is not yet approved or cleared by the [place] FDA and has been authorized for detection and/or  diagnosis of SARS-CoV-2 by FDA under an Emergency Use Authorization (EUA). This EUA will remain in effect (meaning this test can be used) for the duration of the COVID-19 declaration under Section 564(b)(1) of the Act, 21 U.S.C. section 360bbb-3(b)(1), unless the authorization is terminated or revoked.  Performed at [facility], [address] [place], [place] [address]   Blood Culture (routine x 2)     Status: None (Preliminary result)   Collection Time: 03/01/23 11:08 PM   Specimen: BLOOD  Result Value Ref Range Status   Specimen Description BLOOD BLOOD RIGHT ARM  Final   Special Requests   Final    BOTTLES DRAWN AEROBIC AND ANAEROBIC Blood Culture results may not be optimal due to an inadequate volume of blood received in culture bottles   Culture   Final    NO GROWTH 4 DAYS Performed at [facility], [address], [place], [place] [address]    Report Status PENDING  Incomplete  Blood Culture (routine x 2)     Status: None (Preliminary result)   Collection Time: 03/02/23  2:56 AM   Specimen: BLOOD  Result Value Ref Range Status   Specimen  Description BLOOD BLOOD RIGHT ARM  Final   Special Requests   Final    BOTTLES DRAWN AEROBIC AND ANAEROBIC Blood Culture adequate volume   Culture   Final    NO GROWTH 3 DAYS Performed at [facility], [address] [place], [place] [address]    Report Status PENDING  Incomplete  MRSA Next Gen by PCR, Nasal     Status: None   Collection Time: 03/02/23  2:32 PM   Specimen: Nasal Mucosa; Nasal Swab  Result Value Ref Range Status   MRSA by PCR Next Gen NOT DETECTED NOT DETECTED Final    Comment: (NOTE) The GeneXpert MRSA Assay (FDA approved for NASAL specimens only), is one component of a comprehensive MRSA colonization surveillance program. It is not intended to diagnose MRSA infection nor to guide or monitor treatment for MRSA infections. Test performance is not FDA approved in patients less than 20  years old. Performed at [facility], [address] [place], [place] [address]     Coagulation Studies: No results for input(s): "LABPROT", "INR" in the last 72 hours.  Urinalysis: Recent Labs    03/03/23 1420  COLORURINE YELLOW*  LABSPEC 1.009  PHURINE 8.0  GLUCOSEU NEGATIVE  HGBUR NEGATIVE  BILIRUBINUR NEGATIVE  KETONESUR NEGATIVE  PROTEINUR >=300*  NITRITE NEGATIVE  LEUKOCYTESUR NEGATIVE       Imaging: MR BRAIN WO CONTRAST  Result Date: 03/04/2023 CLINICAL DATA:  Transient ischemic attack. Progressive altered mental status. EXAM: MRI HEAD WITHOUT CONTRAST TECHNIQUE: Multiplanar, multiecho pulse sequences of the brain and surrounding structures were obtained without intravenous contrast. COMPARISON:  CT earlier same day.  MRI 06/03/2021. FINDINGS: Brain: Diffusion imaging does not show any acute or subacute infarction or other cause of restricted diffusion. Few old small vessel infarctions in the pons. No focal cerebellar insult. Cerebral hemispheres show moderate chronic small-vessel ischemic changes throughout the deep and subcortical white matter, old infarction of the left basal ganglia/external capsule region with hemosiderin deposition, and old infarction in the posterior right basal ganglia, posterior limb internal capsule and lateral thalamus. No large vessel territory stroke. No mass lesion, acute hemorrhage, hydrocephalus or extra-axial collection. Vascular: Major vessels at the base of the brain show flow. Skull and upper cervical spine: Negative Sinuses/Orbits: Paranasal sinuses are clear. Orbits appear negative except for axial myopia. Other: Small amount of mastoid fluid, right more than left. IMPRESSION: No acute or reversible finding. Chronic small-vessel ischemic changes throughout the brain as outlined above. Some of the old small vessel strokes are associated with hemosiderin deposition, as might be seen with chronic hypertensive small-vessel disease.  Electronically Signed   By: Nelson Chimes M.D.   On: 03/04/2023 13:48   CT HEAD WO CONTRAST (5MM)  Result Date: 03/04/2023 CLINICAL DATA:  Altered mental status with unknown cause EXAM: CT HEAD WITHOUT CONTRAST TECHNIQUE: Contiguous axial images were obtained from the base of the skull through the vertex without intravenous contrast. RADIATION DOSE REDUCTION: This exam was performed according to the departmental dose-optimization program which includes automated exposure control, adjustment of the mA and/or kV according to patient size and/or use of iterative reconstruction technique. COMPARISON:  Head CT 06/03/2021 FINDINGS: Brain: Degraded by motion despite repeat acquisition. Chronic small vessel ischemia in the cerebral white matter with chronic perforator infarcts at the basal ganglia. No evidence of hemorrhage, acute infarct, mass, hydrocephalus, or collection. Vascular: No hyperdense vessel or unexpected calcification. Skull: Normal. Negative for fracture or focal lesion. Sinuses/Orbits: No acute finding. IMPRESSION: 1.  No acute finding. 2. Chronic small vessel disease. 3. Motion artifact. Electronically Signed   By: Jorje Guild M.D.   On: 03/04/2023 09:34     Medications:    sodium chloride     cefTRIAXone (ROCEPHIN)  IV Stopped (03/04/23 1725)    amLODipine  10 mg Oral Daily   aspirin EC  81 mg Oral Daily   carvedilol  25 mg Oral BID WC   Chlorhexidine Gluconate Cloth  6 each Topical Q0600   cholecalciferol  1,000 Units Oral Daily   cinacalcet  60 mg Oral QPM   ferrous sulfate  325 mg Oral BID WC   furosemide  80 mg Oral Once per day on Sun Tue Thu Sat   heparin  5,000 Units Subcutaneous Q12H   hydrALAZINE  50 mg Oral TID   insulin aspart  0-5 Units Subcutaneous QHS   insulin aspart  0-6 Units Subcutaneous TID WC   losartan  50 mg Oral Daily   pentafluoroprop-tetrafluoroeth       rosuvastatin  5 mg Oral Q T,Th,S,Su   senna-docusate  2 tablet Oral Daily   sevelamer carbonate   1,600 mg Oral TID with meals   sodium chloride flush  3 mL Intravenous Q12H   sodium chloride flush  3 mL Intravenous Q12H   sucroferric oxyhydroxide  500 mg Oral TID with meals   sodium chloride, acetaminophen **OR** acetaminophen, benzonatate, guaiFENesin, hydrALAZINE, iohexol, ipratropium-albuterol, metoprolol tartrate, ondansetron (ZOFRAN) IV, pentafluoroprop-tetrafluoroeth, senna-docusate, sodium chloride flush, traZODone  Assessment/ Plan:  Ms. Kristen Francis is a 77 y.o.  female with end stage renal disease on hemodialysis, hypertension, hyperlipidemia, CVA, macular degeneration, who was admitted to [facility] on 03/01/2023 for Respiratory distress [R06.03] SOB (shortness of breath) [R06.02] Pneumonia due to infectious organism, unspecified laterality, unspecified part of lung [J18.9]   [facility]phrology MWF [facility] Left AVF 54kg   End Stage Renal Disease: requiring emergency hemodialysis treatment on admission. Received dialysis earlier today, UF 1L achieved. Next treatment scheduled for Wednesday.   Anemia of chronic kidney disease: hemoglobin within desired range, 11.3. Kristen Francis as outpatient. Restarted iron sulfate and velphoro.    Hypertension: 171/77. Restarted on home regimen of amlodipine, carvedilol, hydralazine, and losartan. Not on a diuretic at home.  - Continue furosemide 80mg  on nondialysis days.    Acute respiratory failure: requiring HFNC and BIPAP on admission. Concerning for HCAP. Empiric cefepime. Continues to require supplemental oxygen. Does not require oxygen at home.    Secondary Hyperparathyroidism: Continue cinacalcet, velphoro and sevelamer.   Altered mental status: CT negative.  - Cefepime can cause altered mental status in renal insufficiency.  - Continue to hold cefepime.  -mentation improving     LOS: 3 Mitsuru Dault 4/1/202412:49 PM

## 2023-03-05 NOTE — Procedures (Signed)
Received patient in bed to unit.  Alert and oriented.  Informed consent signed and in chart.   TX duration:3 hours Treatment started: 8:34AM Treatment ended: 11:45PM  Patient tolerated well.  Transported back to the room  Alert, without acute distress.  Hand-off given to patient's nurse.   Access used: AVF Left Access issues: None  Total UF removed: 1L Medication(s) given: None Post HD VS: 164/27mmHg Post HD weight: 49.1kg   [facility]

## 2023-03-05 NOTE — Consult Note (Addendum)
NEUROLOGY CONSULTATION NOTE   Date of service: March 05, 2023 Patient Name: Kristen Francis MRN:  [id number] DOB:  1946/06/23 Reason for consult: AMS Requesting physician: Dr. Gerlean Ren _ _ _   _ __   _ __ _ _  __ __   _ __   __ _  History of Present Illness   This is a 77 yo woman with hx ESRD on dialysis, HTN, HL, CVA who presented for dyspnea and was admitted for sepsis 2/2 pneumonia. Since admission she has been mildly altered principally with respect to time. On my examination she reports that it is 1933 but is oriented to [facility] and able to have a conversation about her hospitalization. She has been on cefepime until today when she was switched to ceftriaxone. There was concern for RUE and LLE weakness but this appears to be chronic. MRI brain wo contrast today showed no acute findings on personal review.   ROS   Per HPI: all other systems reviewed and are negative  Past History   I have reviewed the following:  Past Medical History:  Diagnosis Date   Anemia    Arthritis    Chronic kidney disease    ESRD   Complication of anesthesia    had procedure and felt incision early 80s   Gout    Hyperlipemia    Hypertension    Macular degeneration, right eye    Stroke [facility])    Past Surgical History:  Procedure Laterality Date   A/V FISTULAGRAM Left 12/25/2018   Procedure: A/V FISTULAGRAM;  Surgeon: Katha Cabal, MD;  Location: [place] [facility];  Service: Cardiovascular;  Laterality: Left;   ABDOMINAL HYSTERECTOMY     APPENDECTOMY     AV FISTULA PLACEMENT Left 07/12/2018   Procedure: INSERTION OF ARTERIOVENOUS (AV) GORE-TEX GRAFT ARM ( BRACHIAL AXILLARY );  Surgeon: Katha Cabal, MD;  Location: [facility] ORS;  Service: Vascular;  Laterality: Left;   COLONOSCOPY WITH PROPOFOL N/A 01/14/2016   Procedure: COLONOSCOPY WITH PROPOFOL;  Surgeon: Manya Silvas, MD;  Location: [facility] ENDOSCOPY;  Service: Endoscopy;  Laterality: N/A;   COLONOSCOPY WITH PROPOFOL N/A 02/14/2022    Procedure: COLONOSCOPY WITH PROPOFOL;  Surgeon: Lesly Rubenstein, MD;  Location: [facility] ENDOSCOPY;  Service: Endoscopy;  Laterality: N/A;   TONSILLECTOMY     Family History  Problem Relation Age of Onset   Kidney disease Mother    Breast cancer Neg Hx    Social History   Socioeconomic History   Marital status: Married    Spouse name: Not on file   Number of children: Not on file   Years of education: Not on file   Highest education level: Not on file  Occupational History   Not on file  Tobacco Use   Smoking status: Former   Smokeless tobacco: Never  Vaping Use   Vaping Use: Never used  Substance and Sexual Activity   Alcohol use: No   Drug use: No   Sexual activity: Not on file  Other Topics Concern   Not on file  Social History Narrative   Lives in  [place]; used to clean; no smoking; no alcohol.    Social Determinants of Health   Financial Resource Strain: Not on file  Food Insecurity: Not on file  Transportation Needs: Not on file  Physical Activity: Not on file  Stress: Not on file  Social Connections: Not on file   Allergies  Allergen Reactions   Other Other (See Comments)  Pt does not receive Blood    Penicillins Hives    Has patient had a PCN reaction causing immediate rash, facial/tongue/throat swelling, SOB or lightheadedness with hypotension: YES Has patient had a PCN reaction causing severe rash involving mucus membranes or skin necrosis: no Has patient had a PCN reaction that required hospitalization: no Has patient had a PCN reaction occurring within the last 10 years: no If all of the above answers are "NO", then may proceed with Cephalosporin use.    Sodium Bicarbonate Itching    Medications   Medications Prior to Admission  Medication Sig Dispense Refill Last Dose   amLODipine (NORVASC) 10 MG tablet TAKE 1 TABLET(10 MG) BY MOUTH DAILY 90 tablet 3    aspirin EC 81 MG EC tablet Take 1 tablet (81 mg total) by mouth daily. Swallow  whole. 30 tablet 0    B Complex-C-Folic Acid (RENAL-VITE) 0.8 MG TABS Take 0.8 mg by mouth every evening. On dialysis days      benzonatate (TESSALON) 100 MG capsule Take 1 capsule (100 mg total) by mouth 2 (two) times daily as needed for cough. 60 capsule 3    carvedilol (COREG) 25 MG tablet Take 1 tablet (25 mg total) by mouth 2 (two) times daily with a meal. 180 tablet 3    cholecalciferol (VITAMIN D) 1000 units tablet Take 1,000 Units by mouth daily.       cinacalcet (SENSIPAR) 60 MG tablet Take 60 mg by mouth every evening.      ferrous sulfate 325 (65 FE) MG tablet Take 1 tablet (325 mg total) by mouth 2 (two) times daily with a meal. 60 tablet 2    hydrALAZINE (APRESOLINE) 25 MG tablet Take 1 tablet (25 mg total) by mouth 3 (three) times daily. For HTN 90 tablet 3    lidocaine-prilocaine (EMLA) cream APPLY SMALL AMOUNT TO ACCESS SITE (AVF) 3 TIMES A WEEK 1 HOUR BEFORE DIALYSIS. COVER WITH OCCLUSIVE DRESSING (SARAN WRAP)  6    losartan (COZAAR) 50 MG tablet Take 1 tablet (50 mg total) by mouth daily. 90 tablet 3    metoCLOPramide (REGLAN) 5 MG tablet Take 1 tablet (5 mg total) by mouth 4 (four) times daily -  before meals and at bedtime. 120 tablet 1    omeprazole (PRILOSEC) 40 MG capsule Take 1 capsule (40 mg total) by mouth daily. 90 capsule 3    rosuvastatin (CRESTOR) 5 MG tablet TAKE 1 TABLET BY MOUTH DAILY. TAKE ON DAYS NOT HAVING DIALYSIS 90 tablet 1    sennosides-docusate sodium (SENOKOT-S) 8.6-50 MG tablet Take 2 tablets by mouth daily.      sevelamer carbonate (RENVELA) 800 MG tablet Take 1,600 mg by mouth 3 (three) times daily.      VELPHORO 500 MG chewable tablet Chew 500 mg by mouth 3 (three) times daily.         Current Facility-Administered Medications:    0.9 %  sodium chloride infusion, 250 mL, Intravenous, PRN, Para Skeans, MD   acetaminophen (TYLENOL) tablet 650 mg, 650 mg, Oral, Q6H PRN **OR** acetaminophen (TYLENOL) suppository 650 mg, 650 mg, Rectal, Q6H PRN, Posey Pronto,  Ekta V, MD   alteplase (CATHFLO ACTIVASE) injection 2 mg, 2 mg, Intracatheter, Once PRN, Lateef, Munsoor, MD   amLODipine (NORVASC) tablet 10 mg, 10 mg, Oral, Daily, Florina Ou V, MD, 10 mg at 03/04/23 [id number]   aspirin EC tablet 81 mg, 81 mg, Oral, Daily, Para Skeans, MD, 81 mg at 03/04/23 [phone]  benzonatate (TESSALON) capsule 100 mg, 100 mg, Oral, BID PRN, Amin, Ankit Chirag, MD, 100 mg at 03/03/23 0824   carvedilol (COREG) tablet 25 mg, 25 mg, Oral, BID WC, Florina Ou V, MD, 25 mg at 03/04/23 1644   cefTRIAXone (ROCEPHIN) 1 g in sodium chloride 0.9 % 100 mL IVPB, 1 g, Intravenous, Q24H, Amin, Ankit Chirag, MD, Stopped at 03/04/23 1725   Chlorhexidine Gluconate Cloth 2 % PADS 6 each, 6 each, Topical, Q0600, Para Skeans, MD, 6 each at 03/03/23 [id number]   cholecalciferol (VITAMIN D3) 25 MCG (1000 UNIT) tablet 1,000 Units, 1,000 Units, Oral, Daily, Amin, Ankit Chirag, MD, 1,000 Units at 03/04/23 0810   cinacalcet (SENSIPAR) tablet 60 mg, 60 mg, Oral, QPM, Para Skeans, MD, 60 mg at 03/04/23 1644   ferrous sulfate tablet 325 mg, 325 mg, Oral, BID WC, Florina Ou V, MD, 325 mg at 03/04/23 1644   furosemide (LASIX) tablet 80 mg, 80 mg, Oral, Once per day on Sun Tue Thu Sat, Kolluru, Sarath, MD, 80 mg at 03/04/23 [id number]   guaiFENesin (ROBITUSSIN) 100 MG/5ML liquid 5 mL, 5 mL, Oral, Q4H PRN, Amin, Ankit Chirag, MD   heparin injection 1,000 Units, 1,000 Units, Dialysis, PRN, Holley Raring, Munsoor, MD   heparin injection 5,000 Units, 5,000 Units, Subcutaneous, Q12H, Para Skeans, MD, 5,000 Units at 03/04/23 2130   hydrALAZINE (APRESOLINE) injection 10 mg, 10 mg, Intravenous, Q4H PRN, Amin, Ankit Chirag, MD   hydrALAZINE (APRESOLINE) tablet 50 mg, 50 mg, Oral, TID, Amin, Ankit Chirag, MD, 50 mg at 03/04/23 2131   insulin aspart (novoLOG) injection 0-5 Units, 0-5 Units, Subcutaneous, QHS, Amin, Ankit Chirag, MD   insulin aspart (novoLOG) injection 0-6 Units, 0-6 Units, Subcutaneous, TID WC, Amin, Ankit Chirag, MD,  1 Units at 03/02/23 1614   iohexol (OMNIPAQUE) 350 MG/ML injection 75 mL, 75 mL, Intravenous, Once PRN, Posey Pronto, Gretta Cool, MD   ipratropium-albuterol (DUONEB) 0.5-2.5 (3) MG/3ML nebulizer solution 3 mL, 3 mL, Nebulization, Q4H PRN, Amin, Ankit Chirag, MD   lidocaine (PF) (XYLOCAINE) 1 % injection 5 mL, 5 mL, Intradermal, PRN, Lateef, Munsoor, MD   lidocaine-prilocaine (EMLA) cream 1 Application, 1 Application, Topical, PRN, Lateef, Munsoor, MD   losartan (COZAAR) tablet 50 mg, 50 mg, Oral, Daily, Florina Ou V, MD, 50 mg at 03/04/23 [id number]   metoprolol tartrate (LOPRESSOR) injection 5 mg, 5 mg, Intravenous, Q4H PRN, Amin, Ankit Chirag, MD   ondansetron (ZOFRAN) injection 4 mg, 4 mg, Intravenous, Q6H PRN, Amin, Ankit Chirag, MD   pentafluoroprop-tetrafluoroeth (GEBAUERS) aerosol 1 Application, 1 Application, Topical, PRN, Lateef, Munsoor, MD   rosuvastatin (CRESTOR) tablet 5 mg, 5 mg, Oral, Q T,Th,S,Su, Patel, Ekta V, MD, 5 mg at 03/04/23 [id number]   senna-docusate (Senokot-S) tablet 1 tablet, 1 tablet, Oral, QHS PRN, Amin, Ankit Chirag, MD   senna-docusate (Senokot-S) tablet 2 tablet, 2 tablet, Oral, Daily, Amin, Ankit Chirag, MD, 2 tablet at 03/04/23 [phone]   sevelamer carbonate (RENVELA) tablet 1,600 mg, 1,600 mg, Oral, TID with meals, Florina Ou V, MD, 1,600 mg at 03/04/23 1707   sodium chloride flush (NS) 0.9 % injection 3 mL, 3 mL, Intravenous, Q12H, Florina Ou V, MD, 3 mL at 03/04/23 2132   sodium chloride flush (NS) 0.9 % injection 3 mL, 3 mL, Intravenous, Q12H, Florina Ou V, MD, 3 mL at 03/04/23 2132   sodium chloride flush (NS) 0.9 % injection 3 mL, 3 mL, Intravenous, PRN, Para Skeans, MD   sucroferric oxyhydroxide [facility]) chewable tablet 500 mg, 500 mg,  Oral, TID with meals, Para Skeans, MD, 500 mg at 03/04/23 1645   traZODone (DESYREL) tablet 50 mg, 50 mg, Oral, QHS PRN, Damita Lack, MD, 50 mg at 03/03/23 2031  Vitals   Vitals:   03/04/23 0804 03/04/23 1637 03/04/23 1953 03/05/23  0357  BP: (!) 171/77 (!) 156/72 (!) 162/80 (!) 161/73  Pulse: 70 73 73 76  Resp: 16 16 18 18   Temp: 98.1 F (36.7 C) 98.4 F (36.9 C) 98 F (36.7 C) 98.4 F (36.9 C)  TempSrc:   Oral Oral  SpO2: 100% 99% 100% 100%  Weight:      Height:         Body mass index is 21.77 kg/m.  Physical Exam   Physical Exam Gen: oriented x2, NAD HEENT: Atraumatic, normocephalic;mucous membranes moist; oropharynx clear, tongue without atrophy or fasciculations. Neck: Supple, trachea midline. Resp: CTAB, no w/r/r CV: RRR, no m/g/r; nml S1 and S2. 2+ symmetric peripheral pulses. Abd: soft/NT/ND; nabs x 4 quad Extrem: Nml bulk; no cyanosis, clubbing, or edema.  Neuro: *MS: oriented x2, NAD, follows simple commands *Speech: fluid, nondysarthric, able to name and repeat *CN:    I: Deferred   II,III: PERRLA, VFF by confrontation, optic discs unable to be visualized 2/2 pupillary constriction   III,IV,VI: EOMI w/o nystagmus, no ptosis   V: Sensation intact from V1 to V3 to LT   VII: Eyelid closure was full.  R NLF flattening   VIII: Hearing intact to voice   IX,X: Voice normal, palate elevates symmetrically    XI: SCM/trap 5/5 bilat   XII: Tongue protrudes midline, no atrophy or fasciculations  *Motor:   Drift but not to bed RUE and LLE. LUE and RLE full strength.  *Sensory: SILT. No double-simultaneous extinction.  *Coordination:  FNF with dysmetria but no ataxia bilat *Reflexes:  2+ and symmetric throughout without clonus; toes down-going bilat *Gait: deferred  NIHSS  1a Level of Conscious.: 0 1b LOC Questions: 1 1c LOC Commands: 0 2 Best Gaze: 0 3 Visual: 0 4 Facial Palsy: 1 5a Motor Arm - left: 0 5b Motor Arm - Right: 1 6a Motor Leg - Left: 1 6b Motor Leg - Right: 0 7 Limb Ataxia: 0 8 Sensory: 0 9 Best Language: 0 10 Dysarthria: 0 11 Extinct. and Inatten.: 0  TOTAL: 4   Premorbid mRS = 2   Labs   CBC:  Recent Labs  Lab 03/01/23 2255 03/02/23 0255 03/04/23 0310  03/05/23 0321  WBC 7.9   < > 3.7* 3.9*  NEUTROABS 6.1  --   --   --   HGB 11.6*   < > 10.4* 11.3*  HCT 37.7   < > 32.9* 34.8*  MCV 90.2   < > 86.4 85.9  PLT 278   < > 199 206   < > = values in this interval not displayed.    Basic Metabolic Panel:  Lab Results  Component Value Date   NA 138 03/05/2023   K 4.2 03/05/2023   CO2 23 03/05/2023   GLUCOSE 73 03/05/2023   BUN 81 (H) 03/05/2023   CREATININE 10.55 (H) 03/05/2023   CALCIUM 8.4 (L) 03/05/2023   GFRNONAA 3 (L) 03/05/2023   GFRAA 8 (L) 07/20/2020   Lipid Panel:  Lab Results  Component Value Date   LDLCALC 57 06/04/2021   HgbA1c:  Lab Results  Component Value Date   HGBA1C 5.1 12/11/2022   Urine Drug Screen: No results found for: "LABOPIA", "COCAINSCRNUR", "  LABBENZ", "AMPHETMU", "THCU", "LABBARB"  Alcohol Level No results found for: "ETH"  MRI brain wo contrast FINDINGS: Brain: Diffusion imaging does not show any acute or subacute infarction or other cause of restricted diffusion. Few old small vessel infarctions in the pons. No focal cerebellar insult. Cerebral hemispheres show moderate chronic small-vessel ischemic changes throughout the deep and subcortical white matter, old infarction of the left basal ganglia/external capsule region with hemosiderin deposition, and old infarction in the posterior right basal ganglia, posterior limb internal capsule and lateral thalamus. No large vessel territory stroke. No mass lesion, acute hemorrhage, hydrocephalus or extra-axial collection.   Vascular: Major vessels at the base of the brain show flow.   Skull and upper cervical spine: Negative   Sinuses/Orbits: Paranasal sinuses are clear. Orbits appear negative except for axial myopia.   Other: Small amount of mastoid fluid, right more than left.   IMPRESSION: No acute or reversible finding. Chronic small-vessel ischemic changes throughout the brain as outlined above. Some of the old small vessel strokes are  associated with hemosiderin deposition, as might be seen with chronic hypertensive small-vessel disease.  CNS imaging personally reviewed; I agree with above interpretation  Impression   This is a 77 yo woman with hx ESRD on dialysis, HTN, HL, CVA who presented for dyspnea and was admitted for sepsis 2/2 pneumonia and has been mildly altered since admission with fluctuating neurologic exam. MRI brain shows no acute findings and suggests that weakness is baseline 2/2 prior strokes. Favor encephalopathy to be multifactorial 2/2 infection, delirium, and potentially cefepime encephalopathy.   Recommendations   - Agree with switching from cefepime to ceftriaxone - Delirium precautions - If mental status does not improve with treatment of infection and being off cefepime please reconsult neurology for further guidance ______________________________________________________________________   Thank you for the opportunity to take part in the care of this patient. If you have any further questions, please contact the neurology consultation attending.  Signed,  Su Monks, MD [facility] [phone]  If 7pm- 7am, please page neurology on call as listed in [place].  **Any copied and pasted documentation in this note was written by me in another application not billed for and pasted by me into this document.

## 2023-03-05 NOTE — Progress Notes (Signed)
Occupational Therapy Treatment Patient Details Name: Kristen Francis MRN: [id number] DOB: 1946/11/08 Today's Date: 03/05/2023   History of present illness 77 year old with history of GERD, ESRD, HLD, HTN, CVA, arthritis admitted for shortness of breath.  Patient missed dialysis on Wednesday.  Admitted for sepsis secondary to pneumonia.  Patient was also noted to be in fluid overload therefore received dialysis in the hospital.   OT comments   Upon entering room, pt supine in bed and agreeable to OT intervention. Several visitors present in room at time of session. Pt is reporting confusion about being asked if she wanted to continue dialysis treatment. OT deferred questions to MD. Pt performing supine >sit with supervision to EOB. Pt stands with min guard and ambulates to sink to wash face and brush teeth with set up to open containers. Pt then ambulates to bathroom for toileting needs with min guard for balance and toilet transfer. Pt's friend present in room and changing linens while pt is address self care needs. Pt returning to supine and has assistance to change hospital gown once in bed. Call bell and all needed items within reach. RN notified of HD dressing needing to be changed. Pt continues to make progress towards OT goals.    Recommendations for follow up therapy are one component of a multi-disciplinary discharge planning process, led by the attending physician.  Recommendations may be updated based on patient status, additional functional criteria and insurance authorization.    Assistance Recommended at Discharge Intermittent Supervision/Assistance  Patient can return home with the following  Assistance with cooking/housework;Assist for transportation;A little help with walking and/or transfers;A little help with bathing/dressing/bathroom   Equipment Recommendations  None recommended by OT       Precautions / Restrictions Precautions Precautions: Fall Restrictions Weight  Bearing Restrictions: No       Mobility Bed Mobility Overal bed mobility: Needs Assistance Bed Mobility: Supine to Sit, Sit to Supine     Supine to sit: Supervision Sit to supine: Supervision        Transfers Overall transfer level: Needs assistance Equipment used: None Transfers: Sit to/from Stand Sit to Stand: Min guard                 Balance Overall balance assessment: Needs assistance Sitting-balance support: No upper extremity supported Sitting balance-Leahy Scale: Good     Standing balance support: No upper extremity supported, During functional activity Standing balance-Leahy Scale: Fair                             ADL either performed or assessed with clinical judgement   ADL Overall ADL's : Needs assistance/impaired     Grooming: Wash/dry hands;Standing;Supervision/safety                   Toilet Transfer: Ambulation;Regular Toilet;Min guard   Toileting- Clothing Manipulation and Hygiene: Sit to/from stand;Min guard              Extremity/Trunk Assessment Upper Extremity Assessment Upper Extremity Assessment: Overall WFL for tasks assessed            Vision Patient Visual Report: No change from baseline            Cognition Arousal/Alertness: Awake/alert Behavior During Therapy: WFL for tasks assessed/performed Overall Cognitive Status: Impaired/Different from baseline  General Comments: Pt endorses feeling confused about questions regaring HD treatment. Pt is pleasant and follows simple commands with increased time.                   Pertinent Vitals/ Pain       Pain Assessment Pain Assessment: No/denies pain         Frequency  Min 3X/week        Progress Toward Goals  OT Goals(current goals can now be found in the care plan section)  Progress towards OT goals: Progressing toward goals     Plan Discharge plan remains appropriate;Frequency  needs to be updated       AM-PAC OT "6 Clicks" Daily Activity     Outcome Measure   Help from another person eating meals?: None Help from another person taking care of personal grooming?: None Help from another person toileting, which includes using toliet, bedpan, or urinal?: A Little Help from another person bathing (including washing, rinsing, drying)?: A Little Help from another person to put on and taking off regular upper body clothing?: None Help from another person to put on and taking off regular lower body clothing?: A Little 6 Click Score: 21    End of Session    OT Visit Diagnosis: Other abnormalities of gait and mobility (R26.89)   Activity Tolerance Patient limited by fatigue   Patient Left in bed;with call bell/phone within reach;with bed alarm set;with family/visitor present   Nurse Communication Mobility status        Time: [id number] OT Time Calculation (min): 25 min  Charges: OT General Charges $OT Visit: 1 Visit OT Treatments $Self Care/Home Management : 23-37 mins  Darleen Crocker, MS, OTR/L , CBIS ascom [phone]  03/05/23, 3:28 PM

## 2023-03-05 NOTE — Progress Notes (Signed)
PT Cancellation Note  Patient Details Name: Ryleeann Ryerson MRN: [id number] DOB: 06-30-1946   Cancelled Treatment:    Reason Eval/Treat Not Completed: Other (comment) Pt noted to be OTF at dialysis at this time. Will f/u as able.  [name], PT, DPT 03/05/23, 11:31 AM  [name] 03/05/2023, 11:31 AM

## 2023-03-05 NOTE — Care Management Important Message (Signed)
Important Message  Patient Details  Name: Kristen Francis MRN: [id number] Date of Birth: [birth year]   Medicare Important Message Given:  Yes     [name] 03/05/2023, 11:55 AM

## 2023-03-05 NOTE — Progress Notes (Addendum)
Physical Therapy Treatment Patient Details Name: Kristen Francis MRN: [id number] DOB: 1946-03-12 Today's Date: 03/05/2023   History of Present Illness 77 year old with history of GERD, ESRD, HLD, HTN, CVA, arthritis admitted for shortness of breath.  Patient missed dialysis on Wednesday.  Admitted for sepsis secondary to pneumonia.  Patient was also noted to be in fluid overload therefore received dialysis in the hospital.    PT Comments    Pt seen for PT tx with pt agreeable, but requires encouragement for participation. Pt is able to complete bed mobility without assistance & ambulate 1 lap around nurses station before pt requesting ot return to room. Pt is able to ambulate with CGA<>close supervision without overt LOB but generally internally distracted with decreased attention & awareness that negatively impacts mobility. Pt perseverative on currently being dead & leaving hospital to go to funeral home despite being AxOx3 -- nurse & MD notified.   HR after gait 86 bpm    Recommendations for follow up therapy are one component of a multi-disciplinary discharge planning process, led by the attending physician.  Recommendations may be updated based on patient status, additional functional criteria and insurance authorization.  Follow Up Recommendations       Assistance Recommended at Discharge Frequent or constant Supervision/Assistance  Patient can return home with the following A little help with walking and/or transfers;A little help with bathing/dressing/bathroom;Help with stairs or ramp for entrance;Assistance with cooking/housework;Assist for transportation   Equipment Recommendations  None recommended by PT    Recommendations for Other Services       Precautions / Restrictions Precautions Precautions: Fall Restrictions Weight Bearing Restrictions: No     Mobility  Bed Mobility   Bed Mobility: Supine to Sit, Sit to Supine     Supine to sit: Supervision, HOB  elevated Sit to supine: HOB elevated, Supervision   General bed mobility comments: encouragement to initiate supine>sit    Transfers Overall transfer level: Needs assistance Equipment used: None Transfers: Sit to/from Stand Sit to Stand: Supervision                Ambulation/Gait Ambulation/Gait assistance: Min guard Gait Distance (Feet): 175 Feet Assistive device: None Gait Pattern/deviations: Decreased stride length, Decreased step length - right, Decreased step length - left Gait velocity: decreased     General Gait Details: Pt intermittently reaching for rails in hallway to hold to but able to walk without BUE support with encouragement/cuing. No overt LOB. Generally internally distracted with decreased awareness that reduces pt's overall balance.   Stairs             Wheelchair Mobility    Modified Rankin (Stroke Patients Only)       Balance Overall balance assessment: Needs assistance Sitting-balance support: No upper extremity supported Sitting balance-Leahy Scale: Good     Standing balance support: No upper extremity supported, During functional activity Standing balance-Leahy Scale: Fair                              Cognition Arousal/Alertness: Awake/alert Behavior During Therapy: WFL for tasks assessed/performed Overall Cognitive Status: Impaired/Different from baseline Area of Impairment: Awareness, Safety/judgement, Memory, Following commands, Problem solving                       Following Commands: Follows one step commands consistently, Follows one step commands with increased time Safety/Judgement: Decreased awareness of safety, Decreased awareness of deficits Awareness: Intellectual  General Comments: Pt oriented to year, location, & self but adamant she's discharging to the funeral home because she wants to opt out of dialysis. Pt also believes she is currently dead despite PT attempting to orient pt  otherwise.        Exercises Other Exercises Other Exercises: Pt performed 10x STS from EOB with CGA & ongoing cuing to continue with exercise & for hand placement. Pt leans posteriorly on EOB with BLE to brace herself.    General Comments        Pertinent Vitals/Pain Pain Assessment Pain Assessment: No/denies pain    Home Living                          Prior Function            PT Goals (current goals can now be found in the care plan section) Acute Rehab PT Goals Patient Stated Goal: to go home PT Goal Formulation: With patient Time For Goal Achievement: 03/17/23 Potential to Achieve Goals: Good Progress towards PT goals: Progressing toward goals    Frequency    Min 2X/week      PT Plan Current plan remains appropriate    Co-evaluation              AM-PAC PT "6 Clicks" Mobility   Outcome Measure  Help needed turning from your back to your side while in a flat bed without using bedrails?: None Help needed moving from lying on your back to sitting on the side of a flat bed without using bedrails?: None Help needed moving to and from a bed to a chair (including a wheelchair)?: A Little Help needed standing up from a chair using your arms (e.g., wheelchair or bedside chair)?: A Little Help needed to walk in hospital room?: A Little Help needed climbing 3-5 steps with a railing? : A Little 6 Click Score: 20    End of Session   Activity Tolerance: Patient tolerated treatment well Patient left: in bed;with call bell/phone within reach;with bed alarm set;with family/visitor present Nurse Communication:  (pt's confusion) PT Visit Diagnosis: Muscle weakness (generalized) (M62.81);Unsteadiness on feet (R26.81)     Time: [id number] PT Time Calculation (min) (ACUTE ONLY): 15 min  Charges:  $Therapeutic Activity: 8-22 mins                     [name], PT, DPT 03/05/23, 2:44 PM   [name] 03/05/2023, 2:43 PM

## 2023-03-05 NOTE — Progress Notes (Signed)
PROGRESS NOTE    Kristen Francis  [id number] DOB: August 01, 1946 DOA: 03/01/2023 PCP: Jonetta Osgood, NP   Brief Narrative:  77 year old with history of GERD, ESRD, HLD, HTN, CVA, arthritis admitted for shortness of breath.  Patient missed dialysis on Wednesday.  Admitted for sepsis secondary to pneumonia.  Patient was also noted to be in fluid overload therefore received dialysis in the hospital.  During the course patient hospital course was complicated by change in mental status and concerns of new weakness.  CT head and MRI brain was negative, seen by neurology.  Cefepime was switched to Rocephin.   Assessment & Plan:  Principal Problem:   SOB (shortness of breath) Active Problems:   Sepsis   Essential hypertension   Multinodular goiter   Anemia   TIA (transient ischemic attack)   Type 2 diabetes mellitus with other diabetic kidney complication     Assessment and Plan: Altered mental status with right-sided weakness and left lower extremity weakness - CT of the head is negative, MRI brain is negative.  Seen by neurology, no further workup indicated at this time   Acute respiratory distress secondary to bilateral infiltrate Sepsis secondary to bilateral pneumonia Patient improved after session of dialysis.  Low-grade temperature.  Blood cultures negative. On empiric cefepime, discontinue vancomycin.  MRSA swab is negative.  Change cefepime to Rocephin.  Plan antibiotic course 5 to 7 days Bronchodilators, I-S/flutter valve COVID/flu-negative CTA chest ordered  but will cancel. Low suspicion from thromboembolic dz   ESRD on HD Missed HD on Wednesday. Dr Holley Raring notified. Received a of Lasix in ED. status post HD on 3/29. 4/1   Essential hypertension -Continue Coreg, losartan - IV as needed   Multinodular goiter TSH/free T4 are normal.  Follow-up outpatient  Anemia Hb stable 11.0 No need for PPI. Hb is stable, no signs of bleeding.    TIA (transient  ischemic attack) Cont home ASA and statin  Type 2 diabetes mellitus with other diabetic kidney complication (HCC) Sliding scale and Accu-Cheks    DVT prophylaxis: SQ Heparin Code Status: Full  Family Communication: Husband updated Status is: Inpatient Ongoing evaluation for change in mental status  Diet Orders (From admission, onward)     Start     Ordered   03/02/23 0119  Diet renal with fluid restriction Fluid restriction: 1200 mL Fluid; Room service appropriate? Yes; Fluid consistency: Thin  Diet effective now       Question Answer Comment  Fluid restriction: 1200 mL Fluid   Room service appropriate? Yes   Fluid consistency: Thin      03/02/23 0119            Subjective: Doing ok today. Seen in HD Examination: Constitutional: Not in acute distress Respiratory: Clear to auscultation bilaterally Cardiovascular: Normal sinus rhythm, no rubs Abdomen: Nontender nondistended good bowel sounds Musculoskeletal: No edema noted Skin: No rashes seen Neurologic: CN 2-12 grossly intact.  And nonfocal. Overall weak but no focal deficits Psychiatric: Normal judgment and insight. Alert and oriented x 3. Normal mood.    Objective: Vitals:   03/05/23 1100 03/05/23 1130 03/05/23 1145 03/05/23 1246  BP: (!) 175/81 (!) 166/87 (!) 164/87 (!) 156/75  Pulse: 80 81 79 80  Resp: (!) 22 20 19 16   Temp:    98.4 F (36.9 C)  TempSrc:      SpO2: 100% 100% 100% 100%  Weight:   49.1 kg   Height:        Intake/Output Summary (Last 24  hours) at 03/05/2023 1251 Last data filed at 03/05/2023 1145 Gross per 24 hour  Intake 98.16 ml  Output 1000 ml  Net -901.84 ml   Filed Weights   03/04/23 0500 03/05/23 0819 03/05/23 1145  Weight: 55.7 kg 49.3 kg 49.1 kg    Scheduled Meds:  amLODipine  10 mg Oral Daily   aspirin EC  81 mg Oral Daily   carvedilol  25 mg Oral BID WC   Chlorhexidine Gluconate Cloth  6 each Topical Q0600   cholecalciferol  1,000 Units Oral Daily   cinacalcet  60 mg  Oral QPM   ferrous sulfate  325 mg Oral BID WC   furosemide  80 mg Oral Once per day on Sun Tue Thu Sat   heparin  5,000 Units Subcutaneous Q12H   hydrALAZINE  50 mg Oral TID   insulin aspart  0-5 Units Subcutaneous QHS   insulin aspart  0-6 Units Subcutaneous TID WC   losartan  50 mg Oral Daily   pentafluoroprop-tetrafluoroeth       rosuvastatin  5 mg Oral Q T,Th,S,Su   senna-docusate  2 tablet Oral Daily   sevelamer carbonate  1,600 mg Oral TID with meals   sodium chloride flush  3 mL Intravenous Q12H   sodium chloride flush  3 mL Intravenous Q12H   sucroferric oxyhydroxide  500 mg Oral TID with meals   Continuous Infusions:  sodium chloride     cefTRIAXone (ROCEPHIN)  IV Stopped (03/04/23 1725)    Nutritional status     Body mass index is 19.17 kg/m.  Data Reviewed:   CBC: Recent Labs  Lab 03/01/23 2255 03/02/23 0255 03/02/23 1354 03/04/23 0310 03/05/23 0321  WBC 7.9 8.2 8.0 3.7* 3.9*  NEUTROABS 6.1  --   --   --   --   HGB 11.6* 11.0* 10.6* 10.4* 11.3*  HCT 37.7 35.8* 32.9* 32.9* 34.8*  MCV 90.2 88.4 85.5 86.4 85.9  PLT 278 245 237 199 [id number]   Basic Metabolic Panel: Recent Labs  Lab 03/01/23 2255 03/02/23 0255 03/03/23 0829 03/04/23 0310 03/05/23 0321  NA 142 141 138 136 138  K 4.1 4.3 4.2 4.1 4.2  CL 98 99 98 98 97*  CO2 23 24 25 24 23   GLUCOSE 121* 115* 82 76 73  BUN 80* 85* 50* 66* 81*  CREATININE 10.38* 10.60* 7.42* 8.34* 10.55*  CALCIUM 8.7* 8.4* 8.7* 8.1* 8.4*  MG  --   --   --  2.1 2.2   GFR: Estimated Creatinine Clearance: 3.6 mL/min (A) (by C-G formula based on SCr of 10.55 mg/dL (H)). Liver Function Tests: Recent Labs  Lab 03/01/23 2255 03/02/23 0255  AST 39 29  ALT 33 29  ALKPHOS 49 49  BILITOT 0.9 0.9  PROT 7.3 6.9  ALBUMIN 3.9 3.7   No results for input(s): "LIPASE", "AMYLASE" in the last 168 hours. No results for input(s): "AMMONIA" in the last 168 hours. Coagulation Profile: No results for input(s): "INR", "PROTIME" in  the last 168 hours. Cardiac Enzymes: No results for input(s): "CKTOTAL", "CKMB", "CKMBINDEX", "TROPONINI" in the last 168 hours. BNP (last 3 results) No results for input(s): "PROBNP" in the last 8760 hours. HbA1C: No results for input(s): "HGBA1C" in the last 72 hours. CBG: Recent Labs  Lab 03/04/23 0809 03/04/23 1215 03/04/23 1639 03/05/23 0732 03/05/23 1242  GLUCAP 73 92 79 64* 82   Lipid Profile: No results for input(s): "CHOL", "HDL", "LDLCALC", "TRIG", "CHOLHDL", "LDLDIRECT" in the last 72 hours.  Thyroid Function Tests: No results for input(s): "TSH", "T4TOTAL", "FREET4", "T3FREE", "THYROIDAB" in the last 72 hours.  Anemia Panel: No results for input(s): "VITAMINB12", "FOLATE", "FERRITIN", "TIBC", "IRON", "RETICCTPCT" in the last 72 hours. Sepsis Labs: Recent Labs  Lab 03/01/23 2308 03/02/23 0202 03/02/23 0255  PROCALCITON  --   --  1.01  LATICACIDVEN 1.7 0.6  --     Recent Results (from the past 240 hour(s))  Resp panel by RT-PCR (RSV, Flu A&B, Covid) Anterior Nasal Swab     Status: None   Collection Time: 03/01/23 10:55 PM   Specimen: Anterior Nasal Swab  Result Value Ref Range Status   SARS Coronavirus 2 by RT PCR NEGATIVE NEGATIVE Final    Comment: (NOTE) SARS-CoV-2 target nucleic acids are NOT DETECTED.  The SARS-CoV-2 RNA is generally detectable in upper respiratory specimens during the acute phase of infection. The lowest concentration of SARS-CoV-2 viral copies this assay can detect is 138 copies/mL. A negative result does not preclude SARS-Cov-2 infection and should not be used as the sole basis for treatment or other patient management decisions. A negative result may occur with  improper specimen collection/handling, submission of specimen other than nasopharyngeal swab, presence of viral mutation(s) within the areas targeted by this assay, and inadequate number of viral copies(<138 copies/mL). A negative result must be combined with clinical  observations, patient history, and epidemiological information. The expected result is Negative.  Fact Sheet for Patients:  EntrepreneurPulse.com.au  Fact Sheet for Healthcare Providers:  IncredibleEmployment.be  This test is no t yet approved or cleared by the [place] FDA and  has been authorized for detection and/or diagnosis of SARS-CoV-2 by FDA under an Emergency Use Authorization (EUA). This EUA will remain  in effect (meaning this test can be used) for the duration of the COVID-19 declaration under Section 564(b)(1) of the Act, 21 U.S.C.section 360bbb-3(b)(1), unless the authorization is terminated  or revoked sooner.       Influenza A by PCR NEGATIVE NEGATIVE Final   Influenza B by PCR NEGATIVE NEGATIVE Final    Comment: (NOTE) The Xpert Xpress SARS-CoV-2/FLU/RSV plus assay is intended as an aid in the diagnosis of influenza from Nasopharyngeal swab specimens and should not be used as a sole basis for treatment. Nasal washings and aspirates are unacceptable for Xpert Xpress SARS-CoV-2/FLU/RSV testing.  Fact Sheet for Patients: EntrepreneurPulse.com.au  Fact Sheet for Healthcare Providers: IncredibleEmployment.be  This test is not yet approved or cleared by the [place] FDA and has been authorized for detection and/or diagnosis of SARS-CoV-2 by FDA under an Emergency Use Authorization (EUA). This EUA will remain in effect (meaning this test can be used) for the duration of the COVID-19 declaration under Section 564(b)(1) of the Act, 21 U.S.C. section 360bbb-3(b)(1), unless the authorization is terminated or revoked.     Resp Syncytial Virus by PCR NEGATIVE NEGATIVE Final    Comment: (NOTE) Fact Sheet for Patients: EntrepreneurPulse.com.au  Fact Sheet for Healthcare Providers: IncredibleEmployment.be  This test is not yet approved or cleared by  the [place] FDA and has been authorized for detection and/or diagnosis of SARS-CoV-2 by FDA under an Emergency Use Authorization (EUA). This EUA will remain in effect (meaning this test can be used) for the duration of the COVID-19 declaration under Section 564(b)(1) of the Act, 21 U.S.C. section 360bbb-3(b)(1), unless the authorization is terminated or revoked.  Performed at [facility], [address] [place], [place] [address]   Blood Culture (routine x 2)  Status: None (Preliminary result)   Collection Time: 03/01/23 11:08 PM   Specimen: BLOOD  Result Value Ref Range Status   Specimen Description BLOOD BLOOD RIGHT ARM  Final   Special Requests   Final    BOTTLES DRAWN AEROBIC AND ANAEROBIC Blood Culture results may not be optimal due to an inadequate volume of blood received in culture bottles   Culture   Final    NO GROWTH 4 DAYS Performed at [facility], [address], [place], [place] [address]    Report Status PENDING  Incomplete  Blood Culture (routine x 2)     Status: None (Preliminary result)   Collection Time: 03/02/23  2:56 AM   Specimen: BLOOD  Result Value Ref Range Status   Specimen Description BLOOD BLOOD RIGHT ARM  Final   Special Requests   Final    BOTTLES DRAWN AEROBIC AND ANAEROBIC Blood Culture adequate volume   Culture   Final    NO GROWTH 3 DAYS Performed at [facility], [address] [place], [place] [address]    Report Status PENDING  Incomplete  MRSA Next Gen by PCR, Nasal     Status: None   Collection Time: 03/02/23  2:32 PM   Specimen: Nasal Mucosa; Nasal Swab  Result Value Ref Range Status   MRSA by PCR Next Gen NOT DETECTED NOT DETECTED Final    Comment: (NOTE) The GeneXpert MRSA Assay (FDA approved for NASAL specimens only), is one component of a comprehensive MRSA colonization surveillance program. It is not intended to diagnose MRSA infection nor to guide or monitor treatment for MRSA  infections. Test performance is not FDA approved in patients less than 27 years old. Performed at [facility], [address], [place], [place] [address]          Radiology Studies: MR BRAIN WO CONTRAST  Result Date: 03/04/2023 CLINICAL DATA:  Transient ischemic attack. Progressive altered mental status. EXAM: MRI HEAD WITHOUT CONTRAST TECHNIQUE: Multiplanar, multiecho pulse sequences of the brain and surrounding structures were obtained without intravenous contrast. COMPARISON:  CT earlier same day.  MRI 06/03/2021. FINDINGS: Brain: Diffusion imaging does not show any acute or subacute infarction or other cause of restricted diffusion. Few old small vessel infarctions in the pons. No focal cerebellar insult. Cerebral hemispheres show moderate chronic small-vessel ischemic changes throughout the deep and subcortical white matter, old infarction of the left basal ganglia/external capsule region with hemosiderin deposition, and old infarction in the posterior right basal ganglia, posterior limb internal capsule and lateral thalamus. No large vessel territory stroke. No mass lesion, acute hemorrhage, hydrocephalus or extra-axial collection. Vascular: Major vessels at the base of the brain show flow. Skull and upper cervical spine: Negative Sinuses/Orbits: Paranasal sinuses are clear. Orbits appear negative except for axial myopia. Other: Small amount of mastoid fluid, right more than left. IMPRESSION: No acute or reversible finding. Chronic small-vessel ischemic changes throughout the brain as outlined above. Some of the old small vessel strokes are associated with hemosiderin deposition, as might be seen with chronic hypertensive small-vessel disease. Electronically Signed   By: Nelson Chimes M.D.   On: 03/04/2023 13:48   CT HEAD WO CONTRAST (5MM)  Result Date: 03/04/2023 CLINICAL DATA:  Altered mental status with unknown cause EXAM: CT HEAD WITHOUT CONTRAST TECHNIQUE: Contiguous axial  images were obtained from the base of the skull through the vertex without intravenous contrast. RADIATION DOSE REDUCTION: This exam was performed according to the departmental dose-optimization program which includes  automated exposure control, adjustment of the mA and/or kV according to patient size and/or use of iterative reconstruction technique. COMPARISON:  Head CT 06/03/2021 FINDINGS: Brain: Degraded by motion despite repeat acquisition. Chronic small vessel ischemia in the cerebral white matter with chronic perforator infarcts at the basal ganglia. No evidence of hemorrhage, acute infarct, mass, hydrocephalus, or collection. Vascular: No hyperdense vessel or unexpected calcification. Skull: Normal. Negative for fracture or focal lesion. Sinuses/Orbits: No acute finding. IMPRESSION: 1. No acute finding. 2. Chronic small vessel disease. 3. Motion artifact. Electronically Signed   By: Jorje Guild M.D.   On: 03/04/2023 09:34           LOS: 3 days   Time spent= 35 mins    Pennie Vanblarcom Arsenio Loader, MD [facility]  If 7PM-7AM, please contact night-coverage  03/05/2023, 12:51 PM

## 2023-03-06 ENCOUNTER — Ambulatory Visit: Payer: Medicare Other | Admitting: Internal Medicine

## 2023-03-06 DIAGNOSIS — R0602 Shortness of breath: Secondary | ICD-10-CM | POA: Diagnosis not present

## 2023-03-06 LAB — CULTURE, BLOOD (ROUTINE X 2): Culture: NO GROWTH

## 2023-03-06 LAB — CBC
HCT: 35.8 % — ABNORMAL LOW (ref 36.0–46.0)
Hemoglobin: 11.2 g/dL — ABNORMAL LOW (ref 12.0–15.0)
MCH: 26.9 pg (ref 26.0–34.0)
MCHC: 31.3 g/dL (ref 30.0–36.0)
MCV: 85.9 fL (ref 80.0–100.0)
Platelets: 208 10*3/uL (ref 150–400)
RBC: 4.17 MIL/uL (ref 3.87–5.11)
RDW: 17.2 % — ABNORMAL HIGH (ref 11.5–15.5)
WBC: 3.9 10*3/uL — ABNORMAL LOW (ref 4.0–10.5)
nRBC: 0 % (ref 0.0–0.2)

## 2023-03-06 LAB — BASIC METABOLIC PANEL
Anion gap: 13 (ref 5–15)
BUN: 42 mg/dL — ABNORMAL HIGH (ref 8–23)
CO2: 26 mmol/L (ref 22–32)
Calcium: 8.6 mg/dL — ABNORMAL LOW (ref 8.9–10.3)
Chloride: 97 mmol/L — ABNORMAL LOW (ref 98–111)
Creatinine, Ser: 6.96 mg/dL — ABNORMAL HIGH (ref 0.44–1.00)
GFR, Estimated: 6 mL/min — ABNORMAL LOW (ref >60)
Glucose, Bld: 83 mg/dL (ref 70–99)
Potassium: 4.2 mmol/L (ref 3.5–5.1)
Sodium: 136 mmol/L (ref 135–145)

## 2023-03-06 LAB — MAGNESIUM: Magnesium: 2 mg/dL (ref 1.7–2.4)

## 2023-03-06 LAB — GLUCOSE, CAPILLARY
Glucose-Capillary: 87 mg/dL (ref 70–99)
Glucose-Capillary: 154 mg/dL — ABNORMAL HIGH (ref 70–99)

## 2023-03-06 MED ORDER — FUROSEMIDE 80 MG PO TABS: 80.0000 mg | ORAL_TABLET | ORAL | 2 refills | Status: DC

## 2023-03-06 MED ORDER — HYDRALAZINE HCL 50 MG PO TABS: 50.0000 mg | ORAL_TABLET | Freq: Three times a day (TID) | ORAL | 2 refills | Status: DC

## 2023-03-06 NOTE — Discharge Summary (Signed)
Physician Discharge Summary   Kristen Francis  female DOB: October 20, 1946  RUE:[id number]  PCP: Sallyanne KusterAbernathy, Alyssa, NP  Admit date: 03/01/2023 Discharge date: 03/06/2023  Admitted From: home Disposition:  home CODE STATUS: Full code   Hospital Course:  For full details, please see H&P, progress notes, consult notes and ancillary notes.  Briefly,  Kristen Francis is a 77 year old with history of ESRD, HTN, CVA admitted for shortness of breath.    Patient missed dialysis on Wednesday.  Admitted for sepsis secondary to pneumonia.  Patient was also noted to be in fluid overload therefore received dialysis in the hospital.  During the course patient hospital course was complicated by change in mental status and concerns of new weakness.  CT head and MRI brain was negative, seen by neurology.    Altered mental status with right-sided weakness and left lower extremity weakness - CT of the head is negative, MRI brain is negative.  Seen by neurology, no further workup indicated at this time   Acute respiratory distress secondary to bilateral infiltrate Sepsis secondary to bilateral pneumonia Patient improved after session of dialysis.  Low-grade temperature.  Blood cultures negative. On empiric cefepime, discontinue vancomycin.  MRSA swab is negative.  Change cefepime to Rocephin, and completed 5 day course.    ESRD on HD Missed HD on Wednesday.  Received Lasix in ED.  status post HD on 3/29, 4/1.   Essential hypertension --Restarted on home regimen of amlodipine, carvedilol, hydralazine, and losartan.  Hydralazine increased from 25 mg to 50 mg TID. --Not on a diuretic at home.  Started on furosemide 80mg  on nondialysis days.    Multinodular goiter TSH/free T4 are normal.  Follow-up outpatient   Anemia of chronic kidney disease: hemoglobin within desired range, 11's. Kristen Francis Mircera as outpatient. Restarted iron sulfate.    TIA (transient ischemic attack) Cont home ASA and  statin   Hx of Type 2 diabetes mellitus, not currently active --recent A1c 5.1, not on home hypoglycemics    Discharge Diagnoses:  Principal Problem:   SOB (shortness of breath) Active Problems:   Sepsis   Essential hypertension   Multinodular goiter   Anemia   TIA (transient ischemic attack)   Type 2 diabetes mellitus with other diabetic kidney complication   30 Day Unplanned Readmission Risk Score    Flowsheet Row ED to Hosp-Admission (Current) from 03/01/2023 [facility]  30 Day Unplanned Readmission Risk Score (%) 23.19 Filed at 03/06/2023 0801       This score is the patient's risk of an unplanned readmission within 30 days of being discharged (0 -100%). The score is based on dignosis, age, lab data, medications, orders, and past utilization.   Low:  0-14.9   Medium: 15-21.9   High: 22-29.9   Extreme: 30 and above         Discharge Instructions:  Allergies as of 03/06/2023       Reactions   Other Other (See Comments)   Pt does not receive Blood    Penicillins Hives   Has patient had a PCN reaction causing immediate rash, facial/tongue/throat swelling, SOB or lightheadedness with hypotension: YES Has patient had a PCN reaction causing severe rash involving mucus membranes or skin necrosis: no Has patient had a PCN reaction that required hospitalization: no Has patient had a PCN reaction occurring within the last 10 years: no If all of the above answers are "NO", then may proceed with Cephalosporin use.   Sodium  Bicarbonate Itching        Medication List     TAKE these medications    amLODipine 10 MG tablet Commonly known as: NORVASC TAKE 1 TABLET(10 MG) BY MOUTH DAILY   aspirin EC 81 MG tablet Take 1 tablet (81 mg total) by mouth daily. Swallow whole.   benzonatate 100 MG capsule Commonly known as: TESSALON Take 1 capsule (100 mg total) by mouth 2 (two) times daily as needed for cough.   carvedilol 25 MG  tablet Commonly known as: COREG Take 1 tablet (25 mg total) by mouth 2 (two) times daily with a meal.   cholecalciferol 1000 units tablet Commonly known as: VITAMIN D Take 1,000 Units by mouth daily.   cinacalcet 60 MG tablet Commonly known as: SENSIPAR Take 60 mg by mouth every evening.   ferrous sulfate 325 (65 FE) MG tablet Take 1 tablet (325 mg total) by mouth 2 (two) times daily with a meal.   furosemide 80 MG tablet Commonly known as: LASIX Take 1 tablet (80 mg total) by mouth 4 (four) times a week. On non-dialysis days Start taking on: March 08, 2023   hydrALAZINE 50 MG tablet Commonly known as: APRESOLINE Take 1 tablet (50 mg total) by mouth 3 (three) times daily. Increased from 25 mg. What changed:  medication strength how much to take additional instructions   lidocaine-prilocaine cream Commonly known as: EMLA APPLY SMALL AMOUNT TO ACCESS SITE (AVF) 3 TIMES A WEEK 1 HOUR BEFORE DIALYSIS. COVER WITH OCCLUSIVE DRESSING (SARAN WRAP)   losartan 50 MG tablet Commonly known as: COZAAR Take 1 tablet (50 mg total) by mouth daily.   metoCLOPramide 5 MG tablet Commonly known as: REGLAN Take 1 tablet (5 mg total) by mouth 4 (four) times daily -  before meals and at bedtime.   omeprazole 40 MG capsule Commonly known as: PRILOSEC Take 1 capsule (40 mg total) by mouth daily.   Renal-Vite 0.8 MG Tabs Take 0.8 mg by mouth every evening. On dialysis days   rosuvastatin 5 MG tablet Commonly known as: CRESTOR TAKE 1 TABLET BY MOUTH DAILY. TAKE ON DAYS NOT HAVING DIALYSIS   sennosides-docusate sodium 8.6-50 MG tablet Commonly known as: SENOKOT-S Take 2 tablets by mouth daily.   sevelamer carbonate 800 MG tablet Commonly known as: RENVELA Take 1,600 mg by mouth 3 (three) times daily.   Velphoro 500 MG chewable tablet Generic drug: sucroferric oxyhydroxide Chew 500 mg by mouth 3 (three) times daily.         Follow-up Information     Abernathy, Arlyss Repress, NP Follow  up in 1 week(s).   Specialty: Nurse Practitioner Contact information: [address] [place] [address] [phone]                 Allergies  Allergen Reactions   Other Other (See Comments)    Pt does not receive Blood    Penicillins Hives    Has patient had a PCN reaction causing immediate rash, facial/tongue/throat swelling, SOB or lightheadedness with hypotension: YES Has patient had a PCN reaction causing severe rash involving mucus membranes or skin necrosis: no Has patient had a PCN reaction that required hospitalization: no Has patient had a PCN reaction occurring within the last 10 years: no If all of the above answers are "NO", then may proceed with Cephalosporin use.    Sodium Bicarbonate Itching     The results of significant diagnostics from this hospitalization (including imaging, microbiology, ancillary and laboratory) are listed below  for reference.   Consultations:   Procedures/Studies: MR BRAIN WO CONTRAST  Result Date: 03/04/2023 CLINICAL DATA:  Transient ischemic attack. Progressive altered mental status. EXAM: MRI HEAD WITHOUT CONTRAST TECHNIQUE: Multiplanar, multiecho pulse sequences of the brain and surrounding structures were obtained without intravenous contrast. COMPARISON:  CT earlier same day.  MRI 06/03/2021. FINDINGS: Brain: Diffusion imaging does not show any acute or subacute infarction or other cause of restricted diffusion. Few old small vessel infarctions in the pons. No focal cerebellar insult. Cerebral hemispheres show moderate chronic small-vessel ischemic changes throughout the deep and subcortical white matter, old infarction of the left basal ganglia/external capsule region with hemosiderin deposition, and old infarction in the posterior right basal ganglia, posterior limb internal capsule and lateral thalamus. No large vessel territory stroke. No mass lesion, acute hemorrhage, hydrocephalus or extra-axial collection. Vascular:  Major vessels at the base of the brain show flow. Skull and upper cervical spine: Negative Sinuses/Orbits: Paranasal sinuses are clear. Orbits appear negative except for axial myopia. Other: Small amount of mastoid fluid, right more than left. IMPRESSION: No acute or reversible finding. Chronic small-vessel ischemic changes throughout the brain as outlined above. Some of the old small vessel strokes are associated with hemosiderin deposition, as might be seen with chronic hypertensive small-vessel disease. Electronically Signed   By: Paulina Fusi M.D.   On: 03/04/2023 13:48   CT HEAD WO CONTRAST ( )  Result Date: 03/04/2023 CLINICAL DATA:  Altered mental status with unknown cause EXAM: CT HEAD WITHOUT CONTRAST TECHNIQUE: Contiguous axial images were obtained from the base of the skull through the vertex without intravenous contrast. RADIATION DOSE REDUCTION: This exam was performed according to the departmental dose-optimization program which includes automated exposure control, adjustment of the mA and/or kV according to patient size and/or use of iterative reconstruction technique. COMPARISON:  Head CT 06/03/2021 FINDINGS: Brain: Degraded by motion despite repeat acquisition. Chronic small vessel ischemia in the cerebral white matter with chronic perforator infarcts at the basal ganglia. No evidence of hemorrhage, acute infarct, mass, hydrocephalus, or collection. Vascular: No hyperdense vessel or unexpected calcification. Skull: Normal. Negative for fracture or focal lesion. Sinuses/Orbits: No acute finding. IMPRESSION: 1. No acute finding. 2. Chronic small vessel disease. 3. Motion artifact. Electronically Signed   By: Tiburcio Pea M.D.   On: 03/04/2023 09:34   DG Chest Port 1 View  Result Date: 03/01/2023 CLINICAL DATA:  Question of sepsis to evaluate for abnormality. Weakness, chest pain, and shortness of breath for 1 week. EXAM: PORTABLE CHEST 1 VIEW COMPARISON:  08/23/2018 FINDINGS: Cardiac  enlargement. Interval development of airspace consolidation throughout both lungs. Changes may represent multifocal pneumonia, edema, or ARDS. Aspiration would also be a possibility in the appropriate clinical setting. Probable small bilateral pleural effusions. No pneumothorax. Calcification of the aorta. Degenerative changes in the spine and shoulders. Vascular stent in the left axilla. IMPRESSION: Cardiac enlargement. Diffuse bilateral pulmonary infiltrates likely edema, pneumonia, or ARDS. Electronically Signed   By: Burman Nieves M.D.   On: 03/01/2023 22:53      Labs: BNP (last 3 results) No results for input(s): "BNP" in the last 8760 hours. Basic Metabolic Panel: Recent Labs  Lab 03/02/23 0255 03/03/23 0829 03/04/23 0310 03/05/23 0321 03/06/23 0349  NA 141 138 136 138 136  K 4.3 4.2 4.1 4.2 4.2  CL 99 98 98 97* 97*  CO2 24 25 24 23 26   GLUCOSE 115* 82 76 73 83  BUN 85* 50* 66* 81* 42*  CREATININE 10.60* 7.42*  8.34* 10.55* 6.96*  CALCIUM 8.4* 8.7* 8.1* 8.4* 8.6*  MG  --   --  2.1 2.2 2.0   Liver Function Tests: Recent Labs  Lab 03/01/23 2255 03/02/23 0255  AST 39 29  ALT 33 29  ALKPHOS 49 49  BILITOT 0.9 0.9  PROT 7.3 6.9  ALBUMIN 3.9 3.7   No results for input(s): "LIPASE", "AMYLASE" in the last 168 hours. No results for input(s): "AMMONIA" in the last 168 hours. CBC: Recent Labs  Lab 03/01/23 2255 03/02/23 0255 03/02/23 1354 03/04/23 0310 03/05/23 0321 03/06/23 0349  WBC 7.9 8.2 8.0 3.7* 3.9* 3.9*  NEUTROABS 6.1  --   --   --   --   --   HGB 11.6* 11.0* 10.6* 10.4* 11.3* 11.2*  HCT 37.7 35.8* 32.9* 32.9* 34.8* 35.8*  MCV 90.2 88.4 85.5 86.4 85.9 85.9  PLT 278 245 237 199 206 208   Cardiac Enzymes: No results for input(s): "CKTOTAL", "CKMB", "CKMBINDEX", "TROPONINI" in the last 168 hours. BNP: Invalid input(s): "POCBNP" CBG: Recent Labs  Lab 03/05/23 0732 03/05/23 1242 03/05/23 1623 03/05/23 2152 03/06/23 0748  GLUCAP 64* 82 76 102* 87    D-Dimer No results for input(s): "DDIMER" in the last 72 hours. Hgb A1c No results for input(s): "HGBA1C" in the last 72 hours. Lipid Profile No results for input(s): "CHOL", "HDL", "LDLCALC", "TRIG", "CHOLHDL", "LDLDIRECT" in the last 72 hours. Thyroid function studies No results for input(s): "TSH", "T4TOTAL", "T3FREE", "THYROIDAB" in the last 72 hours.  Invalid input(s): "FREET3" Anemia work up No results for input(s): "VITAMINB12", "FOLATE", "FERRITIN", "TIBC", "IRON", "RETICCTPCT" in the last 72 hours. Urinalysis    Component Value Date/Time   COLORURINE YELLOW (A) 03/03/2023 1420   APPEARANCEUR HAZY (A) 03/03/2023 1420   APPEARANCEUR Clear 12/09/2019 1649   LABSPEC 1.009 03/03/2023 1420   PHURINE 8.0 03/03/2023 1420   GLUCOSEU NEGATIVE 03/03/2023 1420   HGBUR NEGATIVE 03/03/2023 1420   BILIRUBINUR NEGATIVE 03/03/2023 1420   BILIRUBINUR Negative 12/09/2019 1649   KETONESUR NEGATIVE 03/03/2023 1420   PROTEINUR >=300 (A) 03/03/2023 1420   NITRITE NEGATIVE 03/03/2023 1420   LEUKOCYTESUR NEGATIVE 03/03/2023 1420   Sepsis Labs Recent Labs  Lab 03/02/23 1354 03/04/23 0310 03/05/23 0321 03/06/23 0349  WBC 8.0 3.7* 3.9* 3.9*   Microbiology Recent Results (from the past 240 hour(s))  Resp panel by RT-PCR (RSV, Flu A&B, Covid) Anterior Nasal Swab     Status: None   Collection Time: 03/01/23 10:55 PM   Specimen: Anterior Nasal Swab  Result Value Ref Range Status   SARS Coronavirus 2 by RT PCR NEGATIVE NEGATIVE Final    Comment: (NOTE) SARS-CoV-2 target nucleic acids are NOT DETECTED.  The SARS-CoV-2 RNA is generally detectable in upper respiratory specimens during the acute phase of infection. The lowest concentration of SARS-CoV-2 viral copies this assay can detect is 138 copies/mL. A negative result does not preclude SARS-Cov-2 infection and should not be used as the sole basis for treatment or other patient management decisions. A negative result may occur with   improper specimen collection/handling, submission of specimen other than nasopharyngeal swab, presence of viral mutation(s) within the areas targeted by this assay, and inadequate number of viral copies(<138 copies/mL). A negative result must be combined with clinical observations, patient history, and epidemiological information. The expected result is Negative.  Fact Sheet for Patients:  BloggerCourse.com  Fact Sheet for Healthcare Providers:  SeriousBroker.it  This test is no t yet approved or cleared by the [place] FDA  and  has been authorized for detection and/or diagnosis of SARS-CoV-2 by FDA under an Emergency Use Authorization (EUA). This EUA will remain  in effect (meaning this test can be used) for the duration of the COVID-19 declaration under Section 564(b)(1) of the Act, 21 U.S.C.section 360bbb-3(b)(1), unless the authorization is terminated  or revoked sooner.       Influenza A by PCR NEGATIVE NEGATIVE Final   Influenza B by PCR NEGATIVE NEGATIVE Final    Comment: (NOTE) The Xpert Xpress SARS-CoV-2/FLU/RSV plus assay is intended as an aid in the diagnosis of influenza from Nasopharyngeal swab specimens and should not be used as a sole basis for treatment. Nasal washings and aspirates are unacceptable for Xpert Xpress SARS-CoV-2/FLU/RSV testing.  Fact Sheet for Patients: BloggerCourse.com  Fact Sheet for Healthcare Providers: SeriousBroker.it  This test is not yet approved or cleared by the [place] FDA and has been authorized for detection and/or diagnosis of SARS-CoV-2 by FDA under an Emergency Use Authorization (EUA). This EUA will remain in effect (meaning this test can be used) for the duration of the COVID-19 declaration under Section 564(b)(1) of the Act, 21 U.S.C. section 360bbb-3(b)(1), unless the authorization is terminated or revoked.      Resp Syncytial Virus by PCR NEGATIVE NEGATIVE Final    Comment: (NOTE) Fact Sheet for Patients: BloggerCourse.com  Fact Sheet for Healthcare Providers: SeriousBroker.it  This test is not yet approved or cleared by the [place] FDA and has been authorized for detection and/or diagnosis of SARS-CoV-2 by FDA under an Emergency Use Authorization (EUA). This EUA will remain in effect (meaning this test can be used) for the duration of the COVID-19 declaration under Section 564(b)(1) of the Act, 21 U.S.C. section 360bbb-3(b)(1), unless the authorization is terminated or revoked.  Performed at [facility], [address] [place], [place] [address]   Blood Culture (routine x 2)     Status: None   Collection Time: 03/01/23 11:08 PM   Specimen: BLOOD  Result Value Ref Range Status   Specimen Description BLOOD BLOOD RIGHT ARM  Final   Special Requests   Final    BOTTLES DRAWN AEROBIC AND ANAEROBIC Blood Culture results may not be optimal due to an inadequate volume of blood received in culture bottles   Culture   Final    NO GROWTH 5 DAYS Performed at [facility], [address], [place], [place] [address]    Report Status 03/06/2023 FINAL  Final  Blood Culture (routine x 2)     Status: None (Preliminary result)   Collection Time: 03/02/23  2:56 AM   Specimen: BLOOD  Result Value Ref Range Status   Specimen Description BLOOD BLOOD RIGHT ARM  Final   Special Requests   Final    BOTTLES DRAWN AEROBIC AND ANAEROBIC Blood Culture adequate volume   Culture   Final    NO GROWTH 4 DAYS Performed at [facility], [address], [place], [place] [address]    Report Status PENDING  Incomplete  MRSA Next Gen by PCR, Nasal     Status: None   Collection Time: 03/02/23  2:32 PM   Specimen: Nasal Mucosa; Nasal Swab  Result Value Ref Range Status   MRSA by PCR Next Gen NOT DETECTED NOT DETECTED Final     Comment: (NOTE) The GeneXpert MRSA Assay (FDA approved for NASAL specimens only), is one component of a comprehensive MRSA colonization surveillance program. It is not intended to diagnose MRSA infection nor  to guide or monitor treatment for MRSA infections. Test performance is not FDA approved in patients less than 54 years old. Performed at [facility], [address] [place], [place] [address]      Total time spend on discharging this patient, including the last patient exam, discussing the hospital stay, instructions for ongoing care as it relates to all pertinent caregivers, as well as preparing the medical discharge records, prescriptions, and/or referrals as applicable, is 35 minutes.    Darlin Priestly, MD  [facility] 03/06/2023, 10:45 AM

## 2023-03-06 NOTE — Progress Notes (Signed)
[facility]  ROUNDING NOTE   Subjective:   Patient seen ambulating in room, completing ADLs Alert and fully oriented today States eating during dialysis yesterday, made her feel sick.  She was hallucinated during treatment States she feels well and is ready for discharge.   Objective:  Vital signs in last 24 hours:  Temp:  [97.9 F (36.6 C)-99.1 F (37.3 C)] 97.9 F (36.6 C) (04/02 0743) Pulse Rate:  [73-77] 73 (04/02 0743) Resp:  [16-24] 16 (04/02 0743) BP: (154-173)/(70-77) 173/77 (04/02 0743) SpO2:  [98 %-100 %] 100 % (04/02 0743) Weight:  [56 kg] 56 kg (04/02 0500)  Weight change:  Filed Weights   03/05/23 0819 03/05/23 1145 03/06/23 0500  Weight: 49.3 kg 49.1 kg 56 kg    Intake/Output: I/O last 3 completed shifts: In: 338.2 [P.O.:240; IV Piggyback:98.2] Out: 1000 [Other:1000]   Intake/Output this shift:  Total I/O In: 240 [P.O.:240] Out: -   Physical Exam: General: NAD, laying in bed  Head: Normocephalic, atraumatic. Moist oral mucosal membranes  Eyes: Anicteric  Lungs:  Clear to auscultation, room air  Heart: Regular rate and rhythm  Abdomen:  Soft, nontender,   Extremities:  no peripheral edema.  Neurologic: Right sided weakness. Left lower extremity weakness, alert to self and place.   Skin: No lesions  Access: Left arm AVF    Basic Metabolic Panel: Recent Labs  Lab 03/02/23 0255 03/03/23 0829 03/04/23 0310 03/05/23 0321 03/06/23 0349  NA 141 138 136 138 136  K 4.3 4.2 4.1 4.2 4.2  CL 99 98 98 97* 97*  CO2 24 25 24 23 26   GLUCOSE 115* 82 76 73 83  BUN 85* 50* 66* 81* 42*  CREATININE 10.60* 7.42* 8.34* 10.55* 6.96*  CALCIUM 8.4* 8.7* 8.1* 8.4* 8.6*  MG  --   --  2.1 2.2 2.0     Liver Function Tests: Recent Labs  Lab 03/01/23 2255 03/02/23 0255  AST 39 29  ALT 33 29  ALKPHOS 49 49  BILITOT 0.9 0.9  PROT 7.3 6.9  ALBUMIN 3.9 3.7    No results for input(s): "LIPASE", "AMYLASE" in the last 168 hours. No results for  input(s): "AMMONIA" in the last 168 hours.  CBC: Recent Labs  Lab 03/01/23 2255 03/02/23 0255 03/02/23 1354 03/04/23 0310 03/05/23 0321 03/06/23 0349  WBC 7.9 8.2 8.0 3.7* 3.9* 3.9*  NEUTROABS 6.1  --   --   --   --   --   HGB 11.6* 11.0* 10.6* 10.4* 11.3* 11.2*  HCT 37.7 35.8* 32.9* 32.9* 34.8* 35.8*  MCV 90.2 88.4 85.5 86.4 85.9 85.9  PLT 278 245 237 199 206 208     Cardiac Enzymes: No results for input(s): "CKTOTAL", "CKMB", "CKMBINDEX", "TROPONINI" in the last 168 hours.  BNP: Invalid input(s): "POCBNP"  CBG: Recent Labs  Lab 03/05/23 1242 03/05/23 1623 03/05/23 2152 03/06/23 0748 03/06/23 1141  GLUCAP 82 76 102* 87 154*     Microbiology: Results for orders placed or performed during the hospital encounter of 03/01/23  Resp panel by RT-PCR (RSV, Flu A&B, Covid) Anterior Nasal Swab     Status: None   Collection Time: 03/01/23 10:55 PM   Specimen: Anterior Nasal Swab  Result Value Ref Range Status   SARS Coronavirus 2 by RT PCR NEGATIVE NEGATIVE Final    Comment: (NOTE) SARS-CoV-2 target nucleic acids are NOT DETECTED.  The SARS-CoV-2 RNA is generally detectable in upper respiratory specimens during the acute phase of infection. The lowest  concentration of SARS-CoV-2 viral copies this assay can detect is 138 copies/mL. A negative result does not preclude SARS-Cov-2 infection and should not be used as the sole basis for treatment or other patient management decisions. A negative result may occur with  improper specimen collection/handling, submission of specimen other than nasopharyngeal swab, presence of viral mutation(s) within the areas targeted by this assay, and inadequate number of viral copies(<138 copies/mL). A negative result must be combined with clinical observations, patient history, and epidemiological information. The expected result is Negative.  Fact Sheet for Patients:  EntrepreneurPulse.com.au  Fact Sheet for  Healthcare Providers:  IncredibleEmployment.be  This test is no t yet approved or cleared by the [place] FDA and  has been authorized for detection and/or diagnosis of SARS-CoV-2 by FDA under an Emergency Use Authorization (EUA). This EUA will remain  in effect (meaning this test can be used) for the duration of the COVID-19 declaration under Section 564(b)(1) of the Act, 21 U.S.C.section 360bbb-3(b)(1), unless the authorization is terminated  or revoked sooner.       Influenza A by PCR NEGATIVE NEGATIVE Final   Influenza B by PCR NEGATIVE NEGATIVE Final    Comment: (NOTE) The Xpert Xpress SARS-CoV-2/FLU/RSV plus assay is intended as an aid in the diagnosis of influenza from Nasopharyngeal swab specimens and should not be used as a sole basis for treatment. Nasal washings and aspirates are unacceptable for Xpert Xpress SARS-CoV-2/FLU/RSV testing.  Fact Sheet for Patients: EntrepreneurPulse.com.au  Fact Sheet for Healthcare Providers: IncredibleEmployment.be  This test is not yet approved or cleared by the [place] FDA and has been authorized for detection and/or diagnosis of SARS-CoV-2 by FDA under an Emergency Use Authorization (EUA). This EUA will remain in effect (meaning this test can be used) for the duration of the COVID-19 declaration under Section 564(b)(1) of the Act, 21 U.S.C. section 360bbb-3(b)(1), unless the authorization is terminated or revoked.     Resp Syncytial Virus by PCR NEGATIVE NEGATIVE Final    Comment: (NOTE) Fact Sheet for Patients: EntrepreneurPulse.com.au  Fact Sheet for Healthcare Providers: IncredibleEmployment.be  This test is not yet approved or cleared by the [place] FDA and has been authorized for detection and/or diagnosis of SARS-CoV-2 by FDA under an Emergency Use Authorization (EUA). This EUA will remain in effect (meaning this  test can be used) for the duration of the COVID-19 declaration under Section 564(b)(1) of the Act, 21 U.S.C. section 360bbb-3(b)(1), unless the authorization is terminated or revoked.  Performed at [facility], [address] [place], [place] [address]   Blood Culture (routine x 2)     Status: None   Collection Time: 03/01/23 11:08 PM   Specimen: BLOOD  Result Value Ref Range Status   Specimen Description BLOOD BLOOD RIGHT ARM  Final   Special Requests   Final    BOTTLES DRAWN AEROBIC AND ANAEROBIC Blood Culture results may not be optimal due to an inadequate volume of blood received in culture bottles   Culture   Final    NO GROWTH 5 DAYS Performed at [facility], [address] [place], [place] [address]    Report Status 03/06/2023 FINAL  Final  Blood Culture (routine x 2)     Status: None (Preliminary result)   Collection Time: 03/02/23  2:56 AM   Specimen: BLOOD  Result Value Ref Range Status   Specimen Description BLOOD BLOOD RIGHT ARM  Final   Special Requests   Final    BOTTLES DRAWN  AEROBIC AND ANAEROBIC Blood Culture adequate volume   Culture   Final    NO GROWTH 4 DAYS Performed at [facility], [address] [place], [place] [address]    Report Status PENDING  Incomplete  MRSA Next Gen by PCR, Nasal     Status: None   Collection Time: 03/02/23  2:32 PM   Specimen: Nasal Mucosa; Nasal Swab  Result Value Ref Range Status   MRSA by PCR Next Gen NOT DETECTED NOT DETECTED Final    Comment: (NOTE) The GeneXpert MRSA Assay (FDA approved for NASAL specimens only), is one component of a comprehensive MRSA colonization surveillance program. It is not intended to diagnose MRSA infection nor to guide or monitor treatment for MRSA infections. Test performance is not FDA approved in patients less than 47 years old. Performed at [facility], [address] [place], [place] [address]     Coagulation Studies: No results for  input(s): "LABPROT", "INR" in the last 72 hours.  Urinalysis: Recent Labs    03/03/23 1420  COLORURINE YELLOW*  LABSPEC 1.009  PHURINE 8.0  GLUCOSEU NEGATIVE  HGBUR NEGATIVE  BILIRUBINUR NEGATIVE  KETONESUR NEGATIVE  PROTEINUR >=300*  NITRITE NEGATIVE  LEUKOCYTESUR NEGATIVE       Imaging: MR BRAIN WO CONTRAST  Result Date: 03/04/2023 CLINICAL DATA:  Transient ischemic attack. Progressive altered mental status. EXAM: MRI HEAD WITHOUT CONTRAST TECHNIQUE: Multiplanar, multiecho pulse sequences of the brain and surrounding structures were obtained without intravenous contrast. COMPARISON:  CT earlier same day.  MRI 06/03/2021. FINDINGS: Brain: Diffusion imaging does not show any acute or subacute infarction or other cause of restricted diffusion. Few old small vessel infarctions in the pons. No focal cerebellar insult. Cerebral hemispheres show moderate chronic small-vessel ischemic changes throughout the deep and subcortical white matter, old infarction of the left basal ganglia/external capsule region with hemosiderin deposition, and old infarction in the posterior right basal ganglia, posterior limb internal capsule and lateral thalamus. No large vessel territory stroke. No mass lesion, acute hemorrhage, hydrocephalus or extra-axial collection. Vascular: Major vessels at the base of the brain show flow. Skull and upper cervical spine: Negative Sinuses/Orbits: Paranasal sinuses are clear. Orbits appear negative except for axial myopia. Other: Small amount of mastoid fluid, right more than left. IMPRESSION: No acute or reversible finding. Chronic small-vessel ischemic changes throughout the brain as outlined above. Some of the old small vessel strokes are associated with hemosiderin deposition, as might be seen with chronic hypertensive small-vessel disease. Electronically Signed   By: Nelson Chimes M.D.   On: 03/04/2023 13:48     Medications:    sodium chloride     cefTRIAXone (ROCEPHIN)   IV 1 g (03/05/23 1347)    amLODipine  10 mg Oral Daily   aspirin EC  81 mg Oral Daily   carvedilol  25 mg Oral BID WC   Chlorhexidine Gluconate Cloth  6 each Topical Q0600   cholecalciferol  1,000 Units Oral Daily   cinacalcet  60 mg Oral QPM   ferrous sulfate  325 mg Oral BID WC   furosemide  80 mg Oral Once per day on Sun Tue Thu Sat   heparin  5,000 Units Subcutaneous Q12H   hydrALAZINE  50 mg Oral TID   insulin aspart  0-5 Units Subcutaneous QHS   insulin aspart  0-6 Units Subcutaneous TID WC   losartan  50 mg Oral Daily   rosuvastatin  5 mg Oral Q T,Th,S,Su   senna-docusate  2  tablet Oral Daily   sevelamer carbonate  1,600 mg Oral TID with meals   sodium chloride flush  3 mL Intravenous Q12H   sodium chloride flush  3 mL Intravenous Q12H   sucroferric oxyhydroxide  500 mg Oral TID with meals   sodium chloride, acetaminophen **OR** acetaminophen, benzonatate, guaiFENesin, hydrALAZINE, iohexol, ipratropium-albuterol, metoprolol tartrate, ondansetron (ZOFRAN) IV, senna-docusate, sodium chloride flush, traZODone  Assessment/ Plan:  Ms. Kristen Francis is a 77 y.o.  female with end stage renal disease on hemodialysis, hypertension, hyperlipidemia, CVA, macular degeneration, who was admitted to [facility] on 03/01/2023 for Respiratory distress [R06.03] SOB (shortness of breath) [R06.02] Pneumonia due to infectious organism, unspecified laterality, unspecified part of lung [J18.9]   [facility] MWF [facility] Left AVF 54kg   End Stage Renal Disease: requiring emergency hemodialysis treatment on admission. Dialysis yesterday with UF 1L achieved.  Next treatment scheduled for Wednesday.   Anemia of chronic kidney disease: hemoglobin within desired range, 11.2. Kristen Francis as outpatient. Restarted iron sulfate and velphoro.    Hypertension: 171/77. Restarted on home regimen of amlodipine, carvedilol, hydralazine, and losartan. Not on a diuretic at home.  - Continue  furosemide 80mg  on nondialysis days.    Acute respiratory failure: requiring HFNC and BIPAP on admission. Concerning for HCAP. Empiric cefepime. Weaned to room air   Secondary Hyperparathyroidism: Continue cinacalcet, velphoro and sevelamer.   Altered mental status: CT negative.  - Cefepime can cause altered mental status in renal insufficiency.  - Continue to hold cefepime.  -mentation at baseline     LOS: 4 Kristen Francis 4/2/202412:50 PM

## 2023-03-06 NOTE — Plan of Care (Signed)
Stable for discharge.  Kristen Francis  

## 2023-03-06 NOTE — TOC Transition Note (Signed)
Transition of Care [facility]) - CM/SW Discharge Note   Patient Details  Name: Kristen Francis MRN: [id number] Date of Birth: Oct 03, 1946  [facility] CM/SW Contact:  Beverly Sessions, RN Phone Number: 03/06/2023, 11:20 AM   Clinical Narrative:      Tommi Rumps with [facility] notified of discharge   Barriers to Discharge: Continued Medical Work up   Patient Goals and CMS Choice      Discharge Placement                         Discharge Plan and Services Additional resources added to the After Visit Summary for       Post Acute Care Choice: Home Health                               Social Determinants of Health (SDOH) Interventions SDOH Screenings   Alcohol Screen: Low Risk  (09/06/2022)  Depression (PHQ2-9): Low Risk  (12/11/2022)  Tobacco Use: Medium Risk (12/11/2022)     Readmission Risk Interventions     No data to display

## 2023-03-06 NOTE — Discharge Planning (Signed)
[facility]  [address]  [place] [address] [phone]  Scheduled days: Monday Wednesday and Friday  Treatment time: 5:30am  Patient will resume same schedule at Discharge.

## 2023-03-07 DIAGNOSIS — N186 End stage renal disease: Secondary | ICD-10-CM | POA: Diagnosis not present

## 2023-03-07 DIAGNOSIS — N2581 Secondary hyperparathyroidism of renal origin: Secondary | ICD-10-CM | POA: Diagnosis not present

## 2023-03-07 DIAGNOSIS — D631 Anemia in chronic kidney disease: Secondary | ICD-10-CM | POA: Diagnosis not present

## 2023-03-07 DIAGNOSIS — D689 Coagulation defect, unspecified: Secondary | ICD-10-CM | POA: Diagnosis not present

## 2023-03-07 DIAGNOSIS — Z992 Dependence on renal dialysis: Secondary | ICD-10-CM | POA: Diagnosis not present

## 2023-03-07 LAB — CULTURE, BLOOD (ROUTINE X 2)
Culture: NO GROWTH
Special Requests: ADEQUATE

## 2023-03-09 DIAGNOSIS — Z992 Dependence on renal dialysis: Secondary | ICD-10-CM | POA: Diagnosis not present

## 2023-03-09 DIAGNOSIS — D631 Anemia in chronic kidney disease: Secondary | ICD-10-CM | POA: Diagnosis not present

## 2023-03-09 DIAGNOSIS — D689 Coagulation defect, unspecified: Secondary | ICD-10-CM | POA: Diagnosis not present

## 2023-03-09 DIAGNOSIS — N2581 Secondary hyperparathyroidism of renal origin: Secondary | ICD-10-CM | POA: Diagnosis not present

## 2023-03-09 DIAGNOSIS — N186 End stage renal disease: Secondary | ICD-10-CM | POA: Diagnosis not present

## 2023-03-12 DIAGNOSIS — N186 End stage renal disease: Secondary | ICD-10-CM | POA: Diagnosis not present

## 2023-03-12 DIAGNOSIS — Z992 Dependence on renal dialysis: Secondary | ICD-10-CM | POA: Diagnosis not present

## 2023-03-12 DIAGNOSIS — D689 Coagulation defect, unspecified: Secondary | ICD-10-CM | POA: Diagnosis not present

## 2023-03-12 DIAGNOSIS — N2581 Secondary hyperparathyroidism of renal origin: Secondary | ICD-10-CM | POA: Diagnosis not present

## 2023-03-12 DIAGNOSIS — D631 Anemia in chronic kidney disease: Secondary | ICD-10-CM | POA: Diagnosis not present

## 2023-03-14 ENCOUNTER — Encounter: Payer: Self-pay | Admitting: Nurse Practitioner

## 2023-03-14 ENCOUNTER — Ambulatory Visit (INDEPENDENT_AMBULATORY_CARE_PROVIDER_SITE_OTHER): Payer: Medicare Other | Admitting: Nurse Practitioner

## 2023-03-14 VITALS — BP 140/69 | HR 73 | Temp 98.4°F | Resp 16 | Ht 63.0 in | Wt 121.2 lb

## 2023-03-14 DIAGNOSIS — Z09 Encounter for follow-up examination after completed treatment for conditions other than malignant neoplasm: Secondary | ICD-10-CM | POA: Diagnosis not present

## 2023-03-14 DIAGNOSIS — D631 Anemia in chronic kidney disease: Secondary | ICD-10-CM | POA: Diagnosis not present

## 2023-03-14 DIAGNOSIS — D689 Coagulation defect, unspecified: Secondary | ICD-10-CM | POA: Diagnosis not present

## 2023-03-14 DIAGNOSIS — I10 Essential (primary) hypertension: Secondary | ICD-10-CM | POA: Diagnosis not present

## 2023-03-14 DIAGNOSIS — Z8619 Personal history of other infectious and parasitic diseases: Secondary | ICD-10-CM

## 2023-03-14 DIAGNOSIS — Z992 Dependence on renal dialysis: Secondary | ICD-10-CM | POA: Diagnosis not present

## 2023-03-14 DIAGNOSIS — Z8673 Personal history of transient ischemic attack (TIA), and cerebral infarction without residual deficits: Secondary | ICD-10-CM | POA: Diagnosis not present

## 2023-03-14 DIAGNOSIS — N186 End stage renal disease: Secondary | ICD-10-CM | POA: Diagnosis not present

## 2023-03-14 DIAGNOSIS — N2581 Secondary hyperparathyroidism of renal origin: Secondary | ICD-10-CM | POA: Diagnosis not present

## 2023-03-14 NOTE — Progress Notes (Signed)
[facility] [place] [address] [place] [id number] [phone]                                   Transitional [facility] Clinic   [facility] Acute Issues Care Follow Up                                                                        Patient Demographics  Kristen BevelsOllie Graves Francis, is a 77 y.o. female  DOB [birth year]  MRN [id number].  Primary MD  Kristen Francis, Unknown Flannigan, NP  Admit date: 03/01/2023 Discharge date: 03/06/2023  Reason for TCC follow Up - Sepsis, pneumonia, and TIA   Past Medical History:  Diagnosis Date   Anemia    Arthritis    Chronic kidney disease    ESRD   Complication of anesthesia    had procedure and felt incision early 80s   Gout    Hyperlipemia    Hypertension    Macular degeneration, right eye    Stroke     Past Surgical History:  Procedure Laterality Date   A/V FISTULAGRAM Left 12/25/2018   Procedure: A/V FISTULAGRAM;  Surgeon: Renford DillsSchnier, Gregory G, MD;  Location: [facility] INVASIVE CV LAB;  Service: Cardiovascular;  Laterality: Left;   ABDOMINAL HYSTERECTOMY     APPENDECTOMY     AV FISTULA PLACEMENT Left 07/12/2018   Procedure: INSERTION OF ARTERIOVENOUS (AV) GORE-TEX GRAFT ARM ( BRACHIAL AXILLARY );  Surgeon: Renford DillsSchnier, Gregory G, MD;  Location: [facility] ORS;  Service: Vascular;  Laterality: Left;   COLONOSCOPY WITH PROPOFOL N/A 01/14/2016   Procedure: COLONOSCOPY WITH PROPOFOL;  Surgeon: Scot Junobert T Elliott, MD;  [facility];  Service: Endoscopy;  Laterality: N/A;   COLONOSCOPY WITH PROPOFOL N/A 02/14/2022   Procedure: COLONOSCOPY WITH PROPOFOL;  Surgeon: Regis BillLocklear, Cameron T, MD;  Location: [facility] ENDOSCOPY;  Service: Endoscopy;  Laterality: N/A;   TONSILLECTOMY         Recent HPI and Hospital Course  Hospital Course:  For full details, please see H&P, progress notes, consult notes and ancillary notes.  Briefly,  Kristen Francis is a 77 year old with history of ESRD, HTN, CVA admitted for shortness  of breath.     Patient missed dialysis on Wednesday.  Admitted for sepsis secondary to pneumonia.  Patient was also noted to be in fluid overload therefore received dialysis in the hospital.  During the course patient hospital course was complicated by change in mental status and concerns of new weakness.  CT head and MRI brain was negative, seen by neurology.     Altered mental status with right-sided weakness and left lower extremity weakness - CT of the head is negative, MRI brain is negative.  Seen by neurology, no further workup indicated at this time   Acute respiratory distress secondary to bilateral infiltrate Sepsis secondary to bilateral pneumonia Patient improved after session of dialysis.  Low-grade temperature.  Blood cultures negative. On empiric cefepime, discontinue vancomycin.  MRSA swab is negative.  Change cefepime to Rocephin, and completed 5 day course.     ESRD on HD Missed HD on Wednesday.  Received Lasix in ED.  status post  HD on 3/29, 4/1.   Essential hypertension --Restarted on home regimen of amlodipine, carvedilol, hydralazine, and losartan.  Hydralazine increased from 25 mg to 50 mg TID. --Not on a diuretic at home.  Started on furosemide 80mg  on nondialysis days.    Multinodular goiter TSH/free T4 are normal.  Follow-up outpatient   Anemia of chronic kidney disease: hemoglobin within desired range, 11's. Kristen Francis as outpatient. Restarted iron sulfate.    TIA (transient ischemic attack) Cont home ASA and statin   Hx of Type 2 diabetes mellitus, not currently active --recent A1c 5.1, not on home hypoglycemics   Post Hospital Acute Care Issue to be followed in the Clinic   Principal Problem:   SOB (shortness of breath) Active Problems:   Sepsis   Essential hypertension   Multinodular goiter   Anemia   TIA (transient ischemic attack)   Type 2 diabetes mellitus with other diabetic kidney complication   Subjective:   Kristen Francis today  has, No headache, No chest pain, No abdominal pain - No Nausea, No new weakness tingling or numbness, No Cough - SOB. Still feels weak and tired  Assessment & Plan   1. Hospital discharge follow-up Treated in hospital for sepsis s/t pneumonia. Also had sx of TIA  2. History of sepsis Treated in hospital with IV antibiotics  3. Essential hypertension Blood pressure is ok today. Hydralazine dose was increased in the hospital. Currently taking 4 medications and furosemide on nondialysis days.   4. History of TIA (transient ischemic attack) No residual symptoms, continue rosuvastatin and baby aspirin.     Reason for frequent admissions/ER visits    CKD Dialysis TIA Diabetes Goiter Hypertension Anemia pneumonia   Objective:   Vitals:   03/14/23 1312  BP: (!) 140/69  Pulse: 73  Resp: 16  Temp: 98.4 F (36.9 C)  Weight: 121 lb 3.2 oz (55 kg)  Height: 5\' 3"  (1.6 m)    Wt Readings from Last 3 Encounters:  03/14/23 121 lb 3.2 oz (55 kg)  03/06/23 123 lb 7.3 oz (56 kg)  12/11/22 121 lb 6.4 oz (55.1 kg)    Allergies as of 03/14/2023       Reactions   Other Other (See Comments)   Pt does not receive Blood    Penicillins Hives   Has patient had a PCN reaction causing immediate rash, facial/tongue/throat swelling, SOB or lightheadedness with hypotension: YES Has patient had a PCN reaction causing severe rash involving mucus membranes or skin necrosis: no Has patient had a PCN reaction that required hospitalization: no Has patient had a PCN reaction occurring within the last 10 years: no If all of the above answers are "NO", then may proceed with Cephalosporin use.   Sodium Bicarbonate Itching        Medication List        Accurate as of March 14, 2023  1:43 PM. If you have any questions, ask your nurse or doctor.          amLODipine 10 MG tablet Commonly known as: NORVASC TAKE 1 TABLET(10 MG) BY MOUTH DAILY   aspirin EC 81 MG tablet Take 1 tablet (81 mg  total) by mouth daily. Swallow whole.   benzonatate 100 MG capsule Commonly known as: TESSALON Take 1 capsule (100 mg total) by mouth 2 (two) times daily as needed for cough.   carvedilol 25 MG tablet Commonly known as: COREG Take 1 tablet (25 mg total) by mouth 2 (two) times daily with a  meal.   cholecalciferol 1000 units tablet Commonly known as: VITAMIN D Take 1,000 Units by mouth daily.   cinacalcet 60 MG tablet Commonly known as: SENSIPAR Take 60 mg by mouth every evening.   ferrous sulfate 325 (65 FE) MG tablet Take 1 tablet (325 mg total) by mouth 2 (two) times daily with a meal.   furosemide 80 MG tablet Commonly known as: LASIX Take 1 tablet (80 mg total) by mouth 4 (four) times a week. On non-dialysis days   hydrALAZINE 50 MG tablet Commonly known as: APRESOLINE Take 1 tablet (50 mg total) by mouth 3 (three) times daily. Increased from 25 mg.   lidocaine-prilocaine cream Commonly known as: EMLA APPLY SMALL AMOUNT TO ACCESS SITE (AVF) 3 TIMES A WEEK 1 HOUR BEFORE DIALYSIS. COVER WITH OCCLUSIVE DRESSING (SARAN WRAP)   losartan 50 MG tablet Commonly known as: COZAAR Take 1 tablet (50 mg total) by mouth daily.   metoCLOPramide 5 MG tablet Commonly known as: REGLAN Take 1 tablet (5 mg total) by mouth 4 (four) times daily -  before meals and at bedtime.   omeprazole 40 MG capsule Commonly known as: PRILOSEC Take 1 capsule (40 mg total) by mouth daily.   Renal-Vite 0.8 MG Tabs Take 0.8 mg by mouth every evening. On dialysis days   rosuvastatin 5 MG tablet Commonly known as: CRESTOR TAKE 1 TABLET BY MOUTH DAILY. TAKE ON DAYS NOT HAVING DIALYSIS   sennosides-docusate sodium 8.6-50 MG tablet Commonly known as: SENOKOT-S Take 2 tablets by mouth daily.   sevelamer carbonate 800 MG tablet Commonly known as: RENVELA Take 1,600 mg by mouth 3 (three) times daily.   Velphoro 500 MG chewable tablet Generic drug: sucroferric oxyhydroxide Chew 500 mg by mouth 3  (three) times daily.         Physical Exam: Constitutional: Patient appears well-developed and well-nourished. Not in obvious distress. HENT: Normocephalic, atraumatic, External right and left ear normal. Oropharynx is clear and moist.  Eyes: Conjunctivae and EOM are normal. PERRLA, no scleral icterus. Neck: Normal ROM. Neck supple. No JVD. No tracheal deviation. No thyromegaly. CVS: RRR, S1/S2 +, no murmurs, no gallops, no carotid bruit.  Pulmonary: Effort and breath sounds normal, no stridor, rhonchi, wheezes, rales.  Abdominal: Soft. BS +, no distension, tenderness, rebound or guarding.  Musculoskeletal: Normal range of motion. No edema and no tenderness.  Lymphadenopathy: No lymphadenopathy noted, cervical, inguinal or axillary Neuro: Alert. Normal reflexes, muscle tone coordination. No cranial nerve deficit. Skin: Skin is warm and dry. No rash noted. Not diaphoretic. No erythema. No pallor. Psychiatric: Normal mood and affect. Behavior, judgment, thought content normal.   Data Review   Micro Results No results found for this or any previous visit (from the past 240 hour(s)).   CBC No results for input(s): "WBC", "HGB", "HCT", "PLT", "MCV", "MCH", "MCHC", "RDW", "LYMPHSABS", "MONOABS", "EOSABS", "BASOSABS", "BANDABS" in the last 168 hours.  Invalid input(s): "NEUTRABS", "BANDSABD"  Chemistries  No results for input(s): "NA", "K", "CL", "CO2", "GLUCOSE", "BUN", "CREATININE", "CALCIUM", "MG", "AST", "ALT", "ALKPHOS", "BILITOT" in the last 168 hours.  Invalid input(s): "GFRCGP" ------------------------------------------------------------------------------------------------------------------ estimated creatinine clearance is 5.8 mL/min (A) (by C-G formula based on SCr of 6.96 mg/dL (H)). ------------------------------------------------------------------------------------------------------------------ No results for input(s): "HGBA1C" in the last 72  hours. ------------------------------------------------------------------------------------------------------------------ No results for input(s): "CHOL", "HDL", "LDLCALC", "TRIG", "CHOLHDL", "LDLDIRECT" in the last 72 hours. ------------------------------------------------------------------------------------------------------------------ No results for input(s): "TSH", "T4TOTAL", "T3FREE", "THYROIDAB" in the last 72 hours.  Invalid input(s): "FREET3" ------------------------------------------------------------------------------------------------------------------ No results for input(s): "  VITAMINB12", "FOLATE", "FERRITIN", "TIBC", "IRON", "RETICCTPCT" in the last 72 hours.  Coagulation profile No results for input(s): "INR", "PROTIME" in the last 168 hours.  No results for input(s): "DDIMER" in the last 72 hours.  Cardiac Enzymes No results for input(s): "CKMB", "TROPONINI", "MYOGLOBIN" in the last 168 hours.  Invalid input(s): "CK" ------------------------------------------------------------------------------------------------------------------ Invalid input(s): "POCBNP"  Return for previously scheduled, CPE, Eloisa Chokshi PCP in may.  Time Spent in minutes  45 Time spent with patient included reviewing progress notes, labs, imaging studies, and discussing plan for follow up.   This patient was seen by Kristen Kuster, FNP-C in collaboration with Dr. Beverely Risen as a part of collaborative care agreement.    Kristen Kuster MSN, FNP-C on 03/14/2023 at 1:43 PM   **Disclaimer: This note may have been dictated with voice recognition software. Similar sounding words can inadvertently be transcribed and this note may contain transcription errors which may not have been corrected upon publication of note.**

## 2023-03-16 DIAGNOSIS — Z992 Dependence on renal dialysis: Secondary | ICD-10-CM | POA: Diagnosis not present

## 2023-03-16 DIAGNOSIS — D689 Coagulation defect, unspecified: Secondary | ICD-10-CM | POA: Diagnosis not present

## 2023-03-16 DIAGNOSIS — N186 End stage renal disease: Secondary | ICD-10-CM | POA: Diagnosis not present

## 2023-03-16 DIAGNOSIS — D631 Anemia in chronic kidney disease: Secondary | ICD-10-CM | POA: Diagnosis not present

## 2023-03-16 DIAGNOSIS — N2581 Secondary hyperparathyroidism of renal origin: Secondary | ICD-10-CM | POA: Diagnosis not present

## 2023-03-18 DIAGNOSIS — D631 Anemia in chronic kidney disease: Secondary | ICD-10-CM | POA: Diagnosis not present

## 2023-03-18 DIAGNOSIS — D689 Coagulation defect, unspecified: Secondary | ICD-10-CM | POA: Diagnosis not present

## 2023-03-18 DIAGNOSIS — N2581 Secondary hyperparathyroidism of renal origin: Secondary | ICD-10-CM | POA: Diagnosis not present

## 2023-03-18 DIAGNOSIS — Z992 Dependence on renal dialysis: Secondary | ICD-10-CM | POA: Diagnosis not present

## 2023-03-18 DIAGNOSIS — N186 End stage renal disease: Secondary | ICD-10-CM | POA: Diagnosis not present

## 2023-03-19 DIAGNOSIS — D631 Anemia in chronic kidney disease: Secondary | ICD-10-CM | POA: Diagnosis not present

## 2023-03-19 DIAGNOSIS — N186 End stage renal disease: Secondary | ICD-10-CM | POA: Diagnosis not present

## 2023-03-19 DIAGNOSIS — N2581 Secondary hyperparathyroidism of renal origin: Secondary | ICD-10-CM | POA: Diagnosis not present

## 2023-03-19 DIAGNOSIS — Z992 Dependence on renal dialysis: Secondary | ICD-10-CM | POA: Diagnosis not present

## 2023-03-19 DIAGNOSIS — D689 Coagulation defect, unspecified: Secondary | ICD-10-CM | POA: Diagnosis not present

## 2023-03-21 DIAGNOSIS — Z992 Dependence on renal dialysis: Secondary | ICD-10-CM | POA: Diagnosis not present

## 2023-03-21 DIAGNOSIS — N2581 Secondary hyperparathyroidism of renal origin: Secondary | ICD-10-CM | POA: Diagnosis not present

## 2023-03-21 DIAGNOSIS — N186 End stage renal disease: Secondary | ICD-10-CM | POA: Diagnosis not present

## 2023-03-21 DIAGNOSIS — D631 Anemia in chronic kidney disease: Secondary | ICD-10-CM | POA: Diagnosis not present

## 2023-03-21 DIAGNOSIS — D689 Coagulation defect, unspecified: Secondary | ICD-10-CM | POA: Diagnosis not present

## 2023-03-23 DIAGNOSIS — N2581 Secondary hyperparathyroidism of renal origin: Secondary | ICD-10-CM | POA: Diagnosis not present

## 2023-03-23 DIAGNOSIS — Z992 Dependence on renal dialysis: Secondary | ICD-10-CM | POA: Diagnosis not present

## 2023-03-23 DIAGNOSIS — D631 Anemia in chronic kidney disease: Secondary | ICD-10-CM | POA: Diagnosis not present

## 2023-03-23 DIAGNOSIS — N186 End stage renal disease: Secondary | ICD-10-CM | POA: Diagnosis not present

## 2023-03-23 DIAGNOSIS — D689 Coagulation defect, unspecified: Secondary | ICD-10-CM | POA: Diagnosis not present

## 2023-03-26 DIAGNOSIS — D689 Coagulation defect, unspecified: Secondary | ICD-10-CM | POA: Diagnosis not present

## 2023-03-26 DIAGNOSIS — N2581 Secondary hyperparathyroidism of renal origin: Secondary | ICD-10-CM | POA: Diagnosis not present

## 2023-03-26 DIAGNOSIS — N186 End stage renal disease: Secondary | ICD-10-CM | POA: Diagnosis not present

## 2023-03-26 DIAGNOSIS — Z992 Dependence on renal dialysis: Secondary | ICD-10-CM | POA: Diagnosis not present

## 2023-03-26 DIAGNOSIS — D631 Anemia in chronic kidney disease: Secondary | ICD-10-CM | POA: Diagnosis not present

## 2023-03-28 DIAGNOSIS — N186 End stage renal disease: Secondary | ICD-10-CM | POA: Diagnosis not present

## 2023-03-28 DIAGNOSIS — N2581 Secondary hyperparathyroidism of renal origin: Secondary | ICD-10-CM | POA: Diagnosis not present

## 2023-03-28 DIAGNOSIS — D631 Anemia in chronic kidney disease: Secondary | ICD-10-CM | POA: Diagnosis not present

## 2023-03-28 DIAGNOSIS — D689 Coagulation defect, unspecified: Secondary | ICD-10-CM | POA: Diagnosis not present

## 2023-03-28 DIAGNOSIS — Z992 Dependence on renal dialysis: Secondary | ICD-10-CM | POA: Diagnosis not present

## 2023-03-30 DIAGNOSIS — Z992 Dependence on renal dialysis: Secondary | ICD-10-CM | POA: Diagnosis not present

## 2023-03-30 DIAGNOSIS — N2581 Secondary hyperparathyroidism of renal origin: Secondary | ICD-10-CM | POA: Diagnosis not present

## 2023-03-30 DIAGNOSIS — D689 Coagulation defect, unspecified: Secondary | ICD-10-CM | POA: Diagnosis not present

## 2023-03-30 DIAGNOSIS — N186 End stage renal disease: Secondary | ICD-10-CM | POA: Diagnosis not present

## 2023-03-30 DIAGNOSIS — D631 Anemia in chronic kidney disease: Secondary | ICD-10-CM | POA: Diagnosis not present

## 2023-04-02 DIAGNOSIS — N186 End stage renal disease: Secondary | ICD-10-CM | POA: Diagnosis not present

## 2023-04-02 DIAGNOSIS — N2581 Secondary hyperparathyroidism of renal origin: Secondary | ICD-10-CM | POA: Diagnosis not present

## 2023-04-02 DIAGNOSIS — D631 Anemia in chronic kidney disease: Secondary | ICD-10-CM | POA: Diagnosis not present

## 2023-04-02 DIAGNOSIS — Z992 Dependence on renal dialysis: Secondary | ICD-10-CM | POA: Diagnosis not present

## 2023-04-02 DIAGNOSIS — D689 Coagulation defect, unspecified: Secondary | ICD-10-CM | POA: Diagnosis not present

## 2023-04-03 DIAGNOSIS — Z992 Dependence on renal dialysis: Secondary | ICD-10-CM | POA: Diagnosis not present

## 2023-04-03 DIAGNOSIS — N186 End stage renal disease: Secondary | ICD-10-CM | POA: Diagnosis not present

## 2023-04-03 DIAGNOSIS — N040 Nephrotic syndrome with minor glomerular abnormality: Secondary | ICD-10-CM | POA: Diagnosis not present

## 2023-04-04 DIAGNOSIS — D689 Coagulation defect, unspecified: Secondary | ICD-10-CM | POA: Diagnosis not present

## 2023-04-04 DIAGNOSIS — D631 Anemia in chronic kidney disease: Secondary | ICD-10-CM | POA: Diagnosis not present

## 2023-04-04 DIAGNOSIS — Z23 Encounter for immunization: Secondary | ICD-10-CM | POA: Diagnosis not present

## 2023-04-04 DIAGNOSIS — Z992 Dependence on renal dialysis: Secondary | ICD-10-CM | POA: Diagnosis not present

## 2023-04-04 DIAGNOSIS — N186 End stage renal disease: Secondary | ICD-10-CM | POA: Diagnosis not present

## 2023-04-04 DIAGNOSIS — N2581 Secondary hyperparathyroidism of renal origin: Secondary | ICD-10-CM | POA: Diagnosis not present

## 2023-04-06 DIAGNOSIS — Z23 Encounter for immunization: Secondary | ICD-10-CM | POA: Diagnosis not present

## 2023-04-06 DIAGNOSIS — N186 End stage renal disease: Secondary | ICD-10-CM | POA: Diagnosis not present

## 2023-04-06 DIAGNOSIS — D631 Anemia in chronic kidney disease: Secondary | ICD-10-CM | POA: Diagnosis not present

## 2023-04-06 DIAGNOSIS — D689 Coagulation defect, unspecified: Secondary | ICD-10-CM | POA: Diagnosis not present

## 2023-04-06 DIAGNOSIS — Z992 Dependence on renal dialysis: Secondary | ICD-10-CM | POA: Diagnosis not present

## 2023-04-06 DIAGNOSIS — N2581 Secondary hyperparathyroidism of renal origin: Secondary | ICD-10-CM | POA: Diagnosis not present

## 2023-04-09 DIAGNOSIS — D689 Coagulation defect, unspecified: Secondary | ICD-10-CM | POA: Diagnosis not present

## 2023-04-09 DIAGNOSIS — Z23 Encounter for immunization: Secondary | ICD-10-CM | POA: Diagnosis not present

## 2023-04-09 DIAGNOSIS — D631 Anemia in chronic kidney disease: Secondary | ICD-10-CM | POA: Diagnosis not present

## 2023-04-09 DIAGNOSIS — N186 End stage renal disease: Secondary | ICD-10-CM | POA: Diagnosis not present

## 2023-04-09 DIAGNOSIS — Z992 Dependence on renal dialysis: Secondary | ICD-10-CM | POA: Diagnosis not present

## 2023-04-09 DIAGNOSIS — N2581 Secondary hyperparathyroidism of renal origin: Secondary | ICD-10-CM | POA: Diagnosis not present

## 2023-04-11 DIAGNOSIS — D689 Coagulation defect, unspecified: Secondary | ICD-10-CM | POA: Diagnosis not present

## 2023-04-11 DIAGNOSIS — N2581 Secondary hyperparathyroidism of renal origin: Secondary | ICD-10-CM | POA: Diagnosis not present

## 2023-04-11 DIAGNOSIS — Z992 Dependence on renal dialysis: Secondary | ICD-10-CM | POA: Diagnosis not present

## 2023-04-11 DIAGNOSIS — D631 Anemia in chronic kidney disease: Secondary | ICD-10-CM | POA: Diagnosis not present

## 2023-04-11 DIAGNOSIS — N186 End stage renal disease: Secondary | ICD-10-CM | POA: Diagnosis not present

## 2023-04-11 DIAGNOSIS — Z23 Encounter for immunization: Secondary | ICD-10-CM | POA: Diagnosis not present

## 2023-04-13 DIAGNOSIS — N2581 Secondary hyperparathyroidism of renal origin: Secondary | ICD-10-CM | POA: Diagnosis not present

## 2023-04-13 DIAGNOSIS — D631 Anemia in chronic kidney disease: Secondary | ICD-10-CM | POA: Diagnosis not present

## 2023-04-13 DIAGNOSIS — D689 Coagulation defect, unspecified: Secondary | ICD-10-CM | POA: Diagnosis not present

## 2023-04-13 DIAGNOSIS — N186 End stage renal disease: Secondary | ICD-10-CM | POA: Diagnosis not present

## 2023-04-13 DIAGNOSIS — Z23 Encounter for immunization: Secondary | ICD-10-CM | POA: Diagnosis not present

## 2023-04-13 DIAGNOSIS — Z992 Dependence on renal dialysis: Secondary | ICD-10-CM | POA: Diagnosis not present

## 2023-04-16 DIAGNOSIS — N186 End stage renal disease: Secondary | ICD-10-CM | POA: Diagnosis not present

## 2023-04-16 DIAGNOSIS — D631 Anemia in chronic kidney disease: Secondary | ICD-10-CM | POA: Diagnosis not present

## 2023-04-16 DIAGNOSIS — N2581 Secondary hyperparathyroidism of renal origin: Secondary | ICD-10-CM | POA: Diagnosis not present

## 2023-04-16 DIAGNOSIS — Z992 Dependence on renal dialysis: Secondary | ICD-10-CM | POA: Diagnosis not present

## 2023-04-16 DIAGNOSIS — Z23 Encounter for immunization: Secondary | ICD-10-CM | POA: Diagnosis not present

## 2023-04-16 DIAGNOSIS — D689 Coagulation defect, unspecified: Secondary | ICD-10-CM | POA: Diagnosis not present

## 2023-04-18 ENCOUNTER — Other Ambulatory Visit: Payer: Self-pay

## 2023-04-18 ENCOUNTER — Emergency Department: Payer: Medicare Other

## 2023-04-18 ENCOUNTER — Ambulatory Visit: Payer: Medicare Other | Admitting: Nurse Practitioner

## 2023-04-18 ENCOUNTER — Encounter: Payer: Self-pay | Admitting: Emergency Medicine

## 2023-04-18 ENCOUNTER — Emergency Department
Admission: EM | Admit: 2023-04-18 | Discharge: 2023-04-18 | Disposition: A | Discharge status: Home / Self Care | Payer: Medicare Other | Attending: Emergency Medicine | Admitting: Emergency Medicine

## 2023-04-18 ENCOUNTER — Other Ambulatory Visit: Payer: Self-pay | Admitting: Nurse Practitioner

## 2023-04-18 DIAGNOSIS — T50A95A Adverse effect of other bacterial vaccines, initial encounter: Secondary | ICD-10-CM | POA: Insufficient documentation

## 2023-04-18 DIAGNOSIS — M25511 Pain in right shoulder: Secondary | ICD-10-CM | POA: Diagnosis not present

## 2023-04-18 DIAGNOSIS — D631 Anemia in chronic kidney disease: Secondary | ICD-10-CM | POA: Diagnosis not present

## 2023-04-18 DIAGNOSIS — K219 Gastro-esophageal reflux disease without esophagitis: Secondary | ICD-10-CM

## 2023-04-18 DIAGNOSIS — T50905A Adverse effect of unspecified drugs, medicaments and biological substances, initial encounter: Secondary | ICD-10-CM

## 2023-04-18 DIAGNOSIS — M7989 Other specified soft tissue disorders: Secondary | ICD-10-CM | POA: Diagnosis not present

## 2023-04-18 DIAGNOSIS — D689 Coagulation defect, unspecified: Secondary | ICD-10-CM | POA: Diagnosis not present

## 2023-04-18 DIAGNOSIS — N2581 Secondary hyperparathyroidism of renal origin: Secondary | ICD-10-CM | POA: Diagnosis not present

## 2023-04-18 DIAGNOSIS — Z23 Encounter for immunization: Secondary | ICD-10-CM | POA: Diagnosis not present

## 2023-04-18 DIAGNOSIS — N186 End stage renal disease: Secondary | ICD-10-CM | POA: Diagnosis not present

## 2023-04-18 DIAGNOSIS — Z992 Dependence on renal dialysis: Secondary | ICD-10-CM | POA: Diagnosis not present

## 2023-04-18 LAB — CBC WITH DIFFERENTIAL/PLATELET
Abs Immature Granulocytes: 0.01 10*3/uL (ref 0.00–0.07)
Basophils Absolute: 0 10*3/uL (ref 0.0–0.1)
Basophils Relative: 1 %
Eosinophils Absolute: 0.1 10*3/uL (ref 0.0–0.5)
Eosinophils Relative: 1 %
HCT: 37.9 % (ref 36.0–46.0)
Hemoglobin: 11.6 g/dL — ABNORMAL LOW (ref 12.0–15.0)
Immature Granulocytes: 0 %
Lymphocytes Relative: 14 %
Lymphs Abs: 0.7 10*3/uL (ref 0.7–4.0)
MCH: 26.7 pg (ref 26.0–34.0)
MCHC: 30.6 g/dL (ref 30.0–36.0)
MCV: 87.1 fL (ref 80.0–100.0)
Monocytes Absolute: 0.6 10*3/uL (ref 0.1–1.0)
Monocytes Relative: 11 %
Neutro Abs: 3.8 10*3/uL (ref 1.7–7.7)
Neutrophils Relative %: 73 %
Platelets: 171 10*3/uL (ref 150–400)
RBC: 4.35 MIL/uL (ref 3.87–5.11)
RDW: 16.3 % — ABNORMAL HIGH (ref 11.5–15.5)
WBC: 5.1 10*3/uL (ref 4.0–10.5)
nRBC: 0 % (ref 0.0–0.2)

## 2023-04-18 LAB — COMPREHENSIVE METABOLIC PANEL
ALT: 7 U/L (ref 0–44)
AST: 14 U/L — ABNORMAL LOW (ref 15–41)
Albumin: 3.3 g/dL — ABNORMAL LOW (ref 3.5–5.0)
Alkaline Phosphatase: 38 U/L (ref 38–126)
Anion gap: 14 (ref 5–15)
BUN: 25 mg/dL — ABNORMAL HIGH (ref 8–23)
CO2: 28 mmol/L (ref 22–32)
Calcium: 9.1 mg/dL (ref 8.9–10.3)
Chloride: 93 mmol/L — ABNORMAL LOW (ref 98–111)
Creatinine, Ser: 4.4 mg/dL — ABNORMAL HIGH (ref 0.44–1.00)
GFR, Estimated: 10 mL/min — ABNORMAL LOW (ref >60)
Glucose, Bld: 106 mg/dL — ABNORMAL HIGH (ref 70–99)
Potassium: 3.4 mmol/L — ABNORMAL LOW (ref 3.5–5.1)
Sodium: 135 mmol/L (ref 135–145)
Total Bilirubin: 0.5 mg/dL (ref 0.3–1.2)
Total Protein: 6.7 g/dL (ref 6.5–8.1)

## 2023-04-18 LAB — URIC ACID: Uric Acid, Serum: 1.7 mg/dL — ABNORMAL LOW (ref 2.5–7.1)

## 2023-04-18 MED ORDER — PREDNISONE 50 MG PO TABS: 50.0000 mg | ORAL_TABLET | Freq: Every day | ORAL | 0 refills | Status: DC

## 2023-04-18 MED ORDER — HYDROCODONE-ACETAMINOPHEN 5-325 MG PO TABS: 1.0000 | ORAL_TABLET | ORAL | 0 refills | Status: DC | PRN

## 2023-04-18 MED ORDER — HYDROCODONE-ACETAMINOPHEN 5-325 MG PO TABS
1.0000 | ORAL_TABLET | Freq: Once | ORAL | Status: AC
  Administered 2023-04-18: 1 via ORAL
  Filled 2023-04-18: qty 1

## 2023-04-18 MED ORDER — PREDNISONE 20 MG PO TABS
60.0000 mg | ORAL_TABLET | Freq: Once | ORAL | Status: AC
  Administered 2023-04-18: 60 mg via ORAL
  Filled 2023-04-18: qty 3

## 2023-04-18 NOTE — ED Provider Notes (Signed)
[facility] Provider Note  Patient Contact: 6:07 PM (approximate)   History   Medication Reaction   HPI  Kristen Francis is a 77 y.o. female who presents emergency complaining of pain and and edema in her right shoulder.  Patient states that 3 to 4 weeks ago she had pneumonia, was admitted for several days.  While she was at dialysis today they offered her the pneumonia vaccine and she accepted.  Patient states that she has had some pain and swelling to the shoulder since the injection.  No fevers or chills.  No chest pain, shortness of breath.     Physical Exam   Triage Vital Signs: ED Triage Vitals  Enc Vitals Group     BP 04/18/23 1627 (!) 164/78     Pulse Rate 04/18/23 1627 80     Resp 04/18/23 1627 18     Temp 04/18/23 1627 98.2 F (36.8 C)     Temp Source 04/18/23 1627 Oral     SpO2 04/18/23 1627 96 %     Weight 04/18/23 1626 125 lb (56.7 kg)     Height 04/18/23 1626 5\' 3"  (1.6 m)     Head Circumference --      Peak Flow --      Pain Score 04/18/23 1626 10     Pain Loc --      Pain Edu? --      Excl. in GC? --     Most recent vital signs: Vitals:   04/18/23 1958 04/18/23 1958  BP:  (!) 181/90  Pulse:  74  Resp:  18  Temp: 99.1 F (37.3 C)   SpO2:  98%     General: Alert and in no acute distress.  Cardiovascular:  Good peripheral perfusion Respiratory: Normal respiratory effort without tachypnea or retractions. Lungs CTAB.  Musculoskeletal: Full range of motion to all extremities.  Visualization of the right shoulder reveals mild edema when compared with the left.  There is no overlying erythema.  No significant warmth to palpation.  Area is tender.  Majority of this edema is over the deltoid region.  Pulse and sensation intact distally. Neurologic:  No gross focal neurologic deficits are appreciated.  Skin:   No rash noted Other:   ED Results / Procedures / Treatments   Labs (all labs ordered are listed, but only  abnormal results are displayed) Labs Reviewed  COMPREHENSIVE METABOLIC PANEL - Abnormal; Notable for the following components:      Result Value   Potassium 3.4 (*)    Chloride 93 (*)    Glucose, Bld 106 (*)    BUN 25 (*)    Creatinine, Ser 4.40 (*)    Albumin 3.3 (*)    AST 14 (*)    GFR, Estimated 10 (*)    All other components within normal limits  CBC WITH DIFFERENTIAL/PLATELET - Abnormal; Notable for the following components:   Hemoglobin 11.6 (*)    RDW 16.3 (*)    All other components within normal limits  URIC ACID - Abnormal; Notable for the following components:   Uric Acid, Serum 1.7 (*)    All other components within normal limits     EKG     RADIOLOGY  I personally viewed, evaluated, and interpreted these images as part of my medical decision making, as well as reviewing the written report by the radiologist.  ED Provider Interpretation: No acute traumatic finding, and large joint effusion on x-ray.  DG  Shoulder Right  Result Date: 04/18/2023 CLINICAL DATA:  Pain and swelling EXAM: RIGHT SHOULDER - 2+ VIEW COMPARISON:  None Available. FINDINGS: No fracture or malalignment. High-riding humeral head consistent with rotator cuff disease. Advanced glenohumeral and AC joint degenerative change. Right apex is clear. Protuberant soft tissue swelling in the region of the deltoid. IMPRESSION: 1. No acute osseous abnormality. 2. High-riding humeral head consistent with rotator cuff disease. 3. Advanced glenohumeral and AC joint arthritis. 4. Protuberant soft tissue swelling in the region of right deltoid which could be assessed with cross-sectional imaging as indicated Electronically Signed   By: Jasmine Pang M.D.   On: 04/18/2023 19:04    PROCEDURES:  Critical Care performed: No  Procedures   MEDICATIONS ORDERED IN ED: Medications  predniSONE (DELTASONE) tablet 60 mg (has no administration in time range)  HYDROcodone-acetaminophen (NORCO/VICODIN) 5-325 MG per  tablet 1 tablet (1 tablet Oral Given 04/18/23 1839)     IMPRESSION / MDM / ASSESSMENT AND PLAN / ED COURSE  I reviewed the triage vital signs and the nursing notes.                                 Differential diagnosis includes, but is not limited to, medication reaction, septic arthritis, osteoarthritis, gout   Patient's presentation is most consistent with acute presentation with potential threat to life or bodily function.   Patient's diagnosis is consistent with medication reaction.  Patient presents emergency department with pain and slight edema over the deltoid region after getting her pneumonia vaccine.  Patient has no fevers, chills, systemic findings.  X-ray, labs are reassuring.  I suspect that this is a local reaction/side effect of the medication.  No signs of systemic reaction.  Patient be given steroid and limited pain medication.  Concerning signs and symptoms and return precaution discussed with the patient.  Follow-up with primary care as needed..  Patient is given ED precautions to return to the ED for any worsening or new symptoms.     FINAL CLINICAL IMPRESSION(S) / ED DIAGNOSES   Final diagnoses:  Adverse effect of drug, initial encounter     Rx / DC Orders   ED Discharge Orders          Ordered    predniSONE (DELTASONE) 50 MG tablet  Daily with breakfast        04/18/23 2034    HYDROcodone-acetaminophen (NORCO/VICODIN) 5-325 MG tablet  Every 4 hours PRN        04/18/23 2034             Note:  This document was prepared using Dragon voice recognition software and may include unintentional dictation errors.   Lanette Hampshire 04/18/23 2035    Trinna Post, MD 04/21/23 [phone]

## 2023-04-18 NOTE — ED Triage Notes (Signed)
Pt via POV from home. Pt c/o reaction to a pneumonia shot. States she had the shot in the R deltoid,  pt went to [facility] and stated that she come here. Pt states it is painful and sore. Pt is A&OX4 and NAD

## 2023-04-19 DIAGNOSIS — H353131 Nonexudative age-related macular degeneration, bilateral, early dry stage: Secondary | ICD-10-CM | POA: Diagnosis not present

## 2023-04-19 DIAGNOSIS — H35341 Macular cyst, hole, or pseudohole, right eye: Secondary | ICD-10-CM | POA: Diagnosis not present

## 2023-04-19 DIAGNOSIS — H2513 Age-related nuclear cataract, bilateral: Secondary | ICD-10-CM | POA: Diagnosis not present

## 2023-04-19 DIAGNOSIS — H31011 Macula scars of posterior pole (postinflammatory) (post-traumatic), right eye: Secondary | ICD-10-CM | POA: Diagnosis not present

## 2023-04-20 ENCOUNTER — Encounter: Payer: Self-pay | Admitting: Nurse Practitioner

## 2023-04-20 ENCOUNTER — Ambulatory Visit (INDEPENDENT_AMBULATORY_CARE_PROVIDER_SITE_OTHER): Payer: Medicare Other | Admitting: Nurse Practitioner

## 2023-04-20 VITALS — BP 160/80 | HR 80 | Temp 97.0°F | Resp 16 | Ht 63.0 in | Wt 119.4 lb

## 2023-04-20 DIAGNOSIS — N2581 Secondary hyperparathyroidism of renal origin: Secondary | ICD-10-CM | POA: Diagnosis not present

## 2023-04-20 DIAGNOSIS — T50905D Adverse effect of unspecified drugs, medicaments and biological substances, subsequent encounter: Secondary | ICD-10-CM

## 2023-04-20 DIAGNOSIS — I10 Essential (primary) hypertension: Secondary | ICD-10-CM | POA: Diagnosis not present

## 2023-04-20 DIAGNOSIS — D631 Anemia in chronic kidney disease: Secondary | ICD-10-CM | POA: Diagnosis not present

## 2023-04-20 DIAGNOSIS — Z992 Dependence on renal dialysis: Secondary | ICD-10-CM | POA: Diagnosis not present

## 2023-04-20 DIAGNOSIS — D689 Coagulation defect, unspecified: Secondary | ICD-10-CM | POA: Diagnosis not present

## 2023-04-20 DIAGNOSIS — N186 End stage renal disease: Secondary | ICD-10-CM | POA: Diagnosis not present

## 2023-04-20 DIAGNOSIS — Z23 Encounter for immunization: Secondary | ICD-10-CM | POA: Diagnosis not present

## 2023-04-20 NOTE — Progress Notes (Signed)
[facility] [address] [place], [place] [address]  Internal MEDICINE  Office Visit Note  Patient Name: Kristen Francis  [id number]  [id number]  Date of Service: 04/20/2023  Chief Complaint  Patient presents with   Hyperlipidemia   Hypertension   Follow-up    ED f/u    HPI Kristen Francis presents for a follow-up visit for right shoulder swelling and pain  Was recently in the hospital with pneumonia. Once she was discharged, she went to dialysis and was offered the pneumonia vaccine When she got home, there was increased pain at the injection site  She continued to have pain at the site and her shoulder swelled significantly  Went to ER, was treated and xrays done.     Current Medication: Outpatient Encounter Medications as of 04/20/2023  Medication Sig   amLODipine (NORVASC) 10 MG tablet TAKE 1 TABLET(10 MG) BY MOUTH DAILY   aspirin EC 81 MG EC tablet Take 1 tablet (81 mg total) by mouth daily. Swallow whole.   B Complex-C-Folic Acid (RENAL-VITE) 0.8 MG TABS Take 0.8 mg by mouth every evening. On dialysis days   benzonatate (TESSALON) 100 MG capsule Take 1 capsule (100 mg total) by mouth 2 (two) times daily as needed for cough.   carvedilol (COREG) 25 MG tablet Take 1 tablet (25 mg total) by mouth 2 (two) times daily with a meal.   cholecalciferol (VITAMIN D) 1000 units tablet Take 1,000 Units by mouth daily.    cinacalcet (SENSIPAR) 60 MG tablet Take 60 mg by mouth every evening.   ferrous sulfate 325 (65 FE) MG tablet Take 1 tablet (325 mg total) by mouth 2 (two) times daily with a meal.   furosemide (LASIX) 80 MG tablet Take 1 tablet (80 mg total) by mouth 4 (four) times a week. On non-dialysis days   hydrALAZINE (APRESOLINE) 50 MG tablet Take 1 tablet (50 mg total) by mouth 3 (three) times daily. Increased from 25 mg.   HYDROcodone-acetaminophen (NORCO/VICODIN) 5-325 MG tablet Take 1 tablet by mouth every 4 (four) hours as needed for moderate pain.    lidocaine-prilocaine (EMLA) cream APPLY SMALL AMOUNT TO ACCESS SITE (AVF) 3 TIMES A WEEK 1 HOUR BEFORE DIALYSIS. COVER WITH OCCLUSIVE DRESSING (SARAN WRAP)   losartan (COZAAR) 50 MG tablet Take 1 tablet (50 mg total) by mouth daily.   metoCLOPramide (REGLAN) 5 MG tablet Take 1 tablet (5 mg total) by mouth 4 (four) times daily -  before meals and at bedtime.   omeprazole (PRILOSEC) 40 MG capsule TAKE 1 CAPSULE(40 MG) BY MOUTH DAILY   predniSONE (DELTASONE) 50 MG tablet Take 1 tablet (50 mg total) by mouth daily with breakfast.   rosuvastatin (CRESTOR) 5 MG tablet TAKE 1 TABLET BY MOUTH DAILY. TAKE ON DAYS NOT HAVING DIALYSIS   sennosides-docusate sodium (SENOKOT-S) 8.6-50 MG tablet Take 2 tablets by mouth daily.   sevelamer carbonate (RENVELA) 800 MG tablet Take 1,600 mg by mouth 3 (three) times daily.   VELPHORO 500 MG chewable tablet Chew 500 mg by mouth 3 (three) times daily.   [DISCONTINUED] mirtazapine (REMERON) 7.5 MG tablet Take 1 tablet (7.5 mg total) by mouth at bedtime. (Patient not taking: No sig reported)   [DISCONTINUED] promethazine (PHENERGAN) 12.5 MG tablet Take 1 tablet (12.5 mg total) by mouth every 8 (eight) hours as needed for nausea or vomiting. (Patient not taking: No sig reported)   No facility-administered encounter medications on file as of 04/20/2023.    Surgical History: Past Surgical History:  Procedure Laterality Date   A/V FISTULAGRAM Left 12/25/2018   Procedure: A/V FISTULAGRAM;  Surgeon: Renford Dills, MD;  Location: [facility] INVASIVE CV LAB;  Service: Cardiovascular;  Laterality: Left;   ABDOMINAL HYSTERECTOMY     APPENDECTOMY     AV FISTULA PLACEMENT Left 07/12/2018   Procedure: INSERTION OF ARTERIOVENOUS (AV) GORE-TEX GRAFT ARM ( BRACHIAL AXILLARY );  Surgeon: Renford Dills, MD;  Location: [facility] ORS;  Service: Vascular;  Laterality: Left;   COLONOSCOPY WITH PROPOFOL N/A 01/14/2016   Procedure: COLONOSCOPY WITH PROPOFOL;  Surgeon: Scot Jun, MD;   Location: [facility] ENDOSCOPY;  Service: Endoscopy;  Laterality: N/A;   COLONOSCOPY WITH PROPOFOL N/A 02/14/2022   Procedure: COLONOSCOPY WITH PROPOFOL;  Surgeon: Regis Bill, MD;  Location: [facility] ENDOSCOPY;  Service: Endoscopy;  Laterality: N/A;   TONSILLECTOMY      Medical History: Past Medical History:  Diagnosis Date   Anemia    Arthritis    Chronic kidney disease    ESRD   Complication of anesthesia    had procedure and felt incision early 80s   Gout    Hyperlipemia    Hypertension    Macular degeneration, right eye    Stroke [facility])     Family History: Family History  Problem Relation Age of Onset   Kidney disease Mother    Breast cancer Neg Hx     Social History   Socioeconomic History   Marital status: Married    Spouse name: Not on file   Number of children: Not on file   Years of education: Not on file   Highest education level: Not on file  Occupational History   Not on file  Tobacco Use   Smoking status: Former   Smokeless tobacco: Never  Vaping Use   Vaping Use: Never used  Substance and Sexual Activity   Alcohol use: No   Drug use: No   Sexual activity: Not on file  Other Topics Concern   Not on file  Social History Narrative   Lives in  [place]; used to clean; no smoking; no alcohol.    Social Determinants of Health   Financial Resource Strain: Not on file  Food Insecurity: Not on file  Transportation Needs: Not on file  Physical Activity: Not on file  Stress: Not on file  Social Connections: Not on file  Intimate Partner Violence: Not on file      Review of Systems  Vital Signs: BP (!) 160/80   Pulse 80   Temp (!) 97 F (36.1 C)   Resp 16   Ht 5\' 3"  (1.6 m)   Wt 119 lb 6.4 oz (54.2 kg)   SpO2 95%   BMI 21.15 kg/m    Physical Exam Vitals reviewed.  Constitutional:      General: She is not in acute distress.    Appearance: Normal appearance. She is normal weight. She is not ill-appearing.  HENT:     Head:  Normocephalic and atraumatic.  Eyes:     Pupils: Pupils are equal, round, and reactive to light.  Cardiovascular:     Rate and Rhythm: Normal rate and regular rhythm.  Pulmonary:     Effort: Pulmonary effort is normal. No respiratory distress.  Musculoskeletal:     Comments: Right shoulder swollen, soft, no redness  Neurological:     Mental Status: She is alert and oriented to person, place, and time.  Psychiatric:        Mood and Affect: Mood  normal.        Behavior: Behavior normal.        Assessment/Plan: 1. Essential hypertension Elevated, goes to dialysis, fluctuating BP, still hurting on her right shoulder some   2. Adverse effect of drug, subsequent encounter Possible inflammatory reaction to a pneumonia vaccine injection, right should is still very swollen, continue prednisone until gone. Call clinic next week if no improvement.    General Counseling: Ahmari verbalizes understanding of the findings of todays visit and agrees with plan of treatment. I have discussed any further diagnostic evaluation that may be needed or ordered today. We also reviewed her medications today. she has been encouraged to call the office with any questions or concerns that should arise related to todays visit.    No orders of the defined types were placed in this encounter.   No orders of the defined types were placed in this encounter.   Return if symptoms worsen or fail to improve, for and regular follow up as scheduled .   Total time spent:20 Minutes Time spent includes review of chart, medications, test results, and follow up plan with the patient.   [place] Controlled Substance Database was reviewed by me.  This patient was seen by Sallyanne Kuster, FNP-C in collaboration with Dr. Beverely Risen as a part of collaborative care agreement.   Tanique Matney R. Tedd Sias, MSN, FNP-C Internal medicine

## 2023-04-23 DIAGNOSIS — D689 Coagulation defect, unspecified: Secondary | ICD-10-CM | POA: Diagnosis not present

## 2023-04-23 DIAGNOSIS — D631 Anemia in chronic kidney disease: Secondary | ICD-10-CM | POA: Diagnosis not present

## 2023-04-23 DIAGNOSIS — N2581 Secondary hyperparathyroidism of renal origin: Secondary | ICD-10-CM | POA: Diagnosis not present

## 2023-04-23 DIAGNOSIS — N186 End stage renal disease: Secondary | ICD-10-CM | POA: Diagnosis not present

## 2023-04-23 DIAGNOSIS — Z992 Dependence on renal dialysis: Secondary | ICD-10-CM | POA: Diagnosis not present

## 2023-04-23 DIAGNOSIS — Z23 Encounter for immunization: Secondary | ICD-10-CM | POA: Diagnosis not present

## 2023-04-25 DIAGNOSIS — N2581 Secondary hyperparathyroidism of renal origin: Secondary | ICD-10-CM | POA: Diagnosis not present

## 2023-04-25 DIAGNOSIS — Z23 Encounter for immunization: Secondary | ICD-10-CM | POA: Diagnosis not present

## 2023-04-25 DIAGNOSIS — N186 End stage renal disease: Secondary | ICD-10-CM | POA: Diagnosis not present

## 2023-04-25 DIAGNOSIS — Z992 Dependence on renal dialysis: Secondary | ICD-10-CM | POA: Diagnosis not present

## 2023-04-25 DIAGNOSIS — D689 Coagulation defect, unspecified: Secondary | ICD-10-CM | POA: Diagnosis not present

## 2023-04-25 DIAGNOSIS — D631 Anemia in chronic kidney disease: Secondary | ICD-10-CM | POA: Diagnosis not present

## 2023-04-27 DIAGNOSIS — N186 End stage renal disease: Secondary | ICD-10-CM | POA: Diagnosis not present

## 2023-04-27 DIAGNOSIS — Z992 Dependence on renal dialysis: Secondary | ICD-10-CM | POA: Diagnosis not present

## 2023-04-27 DIAGNOSIS — N2581 Secondary hyperparathyroidism of renal origin: Secondary | ICD-10-CM | POA: Diagnosis not present

## 2023-04-27 DIAGNOSIS — Z23 Encounter for immunization: Secondary | ICD-10-CM | POA: Diagnosis not present

## 2023-04-27 DIAGNOSIS — D689 Coagulation defect, unspecified: Secondary | ICD-10-CM | POA: Diagnosis not present

## 2023-04-27 DIAGNOSIS — D631 Anemia in chronic kidney disease: Secondary | ICD-10-CM | POA: Diagnosis not present

## 2023-04-30 DIAGNOSIS — D689 Coagulation defect, unspecified: Secondary | ICD-10-CM | POA: Diagnosis not present

## 2023-04-30 DIAGNOSIS — N186 End stage renal disease: Secondary | ICD-10-CM | POA: Diagnosis not present

## 2023-04-30 DIAGNOSIS — Z23 Encounter for immunization: Secondary | ICD-10-CM | POA: Diagnosis not present

## 2023-04-30 DIAGNOSIS — D631 Anemia in chronic kidney disease: Secondary | ICD-10-CM | POA: Diagnosis not present

## 2023-04-30 DIAGNOSIS — N2581 Secondary hyperparathyroidism of renal origin: Secondary | ICD-10-CM | POA: Diagnosis not present

## 2023-04-30 DIAGNOSIS — Z992 Dependence on renal dialysis: Secondary | ICD-10-CM | POA: Diagnosis not present

## 2023-05-02 DIAGNOSIS — Z992 Dependence on renal dialysis: Secondary | ICD-10-CM | POA: Diagnosis not present

## 2023-05-02 DIAGNOSIS — D689 Coagulation defect, unspecified: Secondary | ICD-10-CM | POA: Diagnosis not present

## 2023-05-02 DIAGNOSIS — D631 Anemia in chronic kidney disease: Secondary | ICD-10-CM | POA: Diagnosis not present

## 2023-05-02 DIAGNOSIS — Z23 Encounter for immunization: Secondary | ICD-10-CM | POA: Diagnosis not present

## 2023-05-02 DIAGNOSIS — N186 End stage renal disease: Secondary | ICD-10-CM | POA: Diagnosis not present

## 2023-05-02 DIAGNOSIS — N2581 Secondary hyperparathyroidism of renal origin: Secondary | ICD-10-CM | POA: Diagnosis not present

## 2023-05-04 ENCOUNTER — Other Ambulatory Visit: Payer: Self-pay | Admitting: Nurse Practitioner

## 2023-05-04 DIAGNOSIS — I10 Essential (primary) hypertension: Secondary | ICD-10-CM

## 2023-05-04 DIAGNOSIS — D689 Coagulation defect, unspecified: Secondary | ICD-10-CM | POA: Diagnosis not present

## 2023-05-04 DIAGNOSIS — N2581 Secondary hyperparathyroidism of renal origin: Secondary | ICD-10-CM | POA: Diagnosis not present

## 2023-05-04 DIAGNOSIS — Z23 Encounter for immunization: Secondary | ICD-10-CM | POA: Diagnosis not present

## 2023-05-04 DIAGNOSIS — N040 Nephrotic syndrome with minor glomerular abnormality: Secondary | ICD-10-CM | POA: Diagnosis not present

## 2023-05-04 DIAGNOSIS — N186 End stage renal disease: Secondary | ICD-10-CM | POA: Diagnosis not present

## 2023-05-04 DIAGNOSIS — Z992 Dependence on renal dialysis: Secondary | ICD-10-CM | POA: Diagnosis not present

## 2023-05-04 DIAGNOSIS — D631 Anemia in chronic kidney disease: Secondary | ICD-10-CM | POA: Diagnosis not present

## 2023-05-07 DIAGNOSIS — N2581 Secondary hyperparathyroidism of renal origin: Secondary | ICD-10-CM | POA: Diagnosis not present

## 2023-05-07 DIAGNOSIS — Z992 Dependence on renal dialysis: Secondary | ICD-10-CM | POA: Diagnosis not present

## 2023-05-07 DIAGNOSIS — D631 Anemia in chronic kidney disease: Secondary | ICD-10-CM | POA: Diagnosis not present

## 2023-05-07 DIAGNOSIS — D509 Iron deficiency anemia, unspecified: Secondary | ICD-10-CM | POA: Diagnosis not present

## 2023-05-07 DIAGNOSIS — N186 End stage renal disease: Secondary | ICD-10-CM | POA: Diagnosis not present

## 2023-05-07 DIAGNOSIS — D689 Coagulation defect, unspecified: Secondary | ICD-10-CM | POA: Diagnosis not present

## 2023-05-09 DIAGNOSIS — N186 End stage renal disease: Secondary | ICD-10-CM | POA: Diagnosis not present

## 2023-05-09 DIAGNOSIS — D689 Coagulation defect, unspecified: Secondary | ICD-10-CM | POA: Diagnosis not present

## 2023-05-09 DIAGNOSIS — N2581 Secondary hyperparathyroidism of renal origin: Secondary | ICD-10-CM | POA: Diagnosis not present

## 2023-05-09 DIAGNOSIS — D509 Iron deficiency anemia, unspecified: Secondary | ICD-10-CM | POA: Diagnosis not present

## 2023-05-09 DIAGNOSIS — D631 Anemia in chronic kidney disease: Secondary | ICD-10-CM | POA: Diagnosis not present

## 2023-05-09 DIAGNOSIS — Z992 Dependence on renal dialysis: Secondary | ICD-10-CM | POA: Diagnosis not present

## 2023-05-11 DIAGNOSIS — Z992 Dependence on renal dialysis: Secondary | ICD-10-CM | POA: Diagnosis not present

## 2023-05-11 DIAGNOSIS — D631 Anemia in chronic kidney disease: Secondary | ICD-10-CM | POA: Diagnosis not present

## 2023-05-11 DIAGNOSIS — D689 Coagulation defect, unspecified: Secondary | ICD-10-CM | POA: Diagnosis not present

## 2023-05-11 DIAGNOSIS — N186 End stage renal disease: Secondary | ICD-10-CM | POA: Diagnosis not present

## 2023-05-11 DIAGNOSIS — D509 Iron deficiency anemia, unspecified: Secondary | ICD-10-CM | POA: Diagnosis not present

## 2023-05-11 DIAGNOSIS — N2581 Secondary hyperparathyroidism of renal origin: Secondary | ICD-10-CM | POA: Diagnosis not present

## 2023-05-14 DIAGNOSIS — D631 Anemia in chronic kidney disease: Secondary | ICD-10-CM | POA: Diagnosis not present

## 2023-05-14 DIAGNOSIS — D509 Iron deficiency anemia, unspecified: Secondary | ICD-10-CM | POA: Diagnosis not present

## 2023-05-14 DIAGNOSIS — N186 End stage renal disease: Secondary | ICD-10-CM | POA: Diagnosis not present

## 2023-05-14 DIAGNOSIS — N2581 Secondary hyperparathyroidism of renal origin: Secondary | ICD-10-CM | POA: Diagnosis not present

## 2023-05-14 DIAGNOSIS — D689 Coagulation defect, unspecified: Secondary | ICD-10-CM | POA: Diagnosis not present

## 2023-05-14 DIAGNOSIS — Z992 Dependence on renal dialysis: Secondary | ICD-10-CM | POA: Diagnosis not present

## 2023-05-16 DIAGNOSIS — D509 Iron deficiency anemia, unspecified: Secondary | ICD-10-CM | POA: Diagnosis not present

## 2023-05-16 DIAGNOSIS — N2581 Secondary hyperparathyroidism of renal origin: Secondary | ICD-10-CM | POA: Diagnosis not present

## 2023-05-16 DIAGNOSIS — D689 Coagulation defect, unspecified: Secondary | ICD-10-CM | POA: Diagnosis not present

## 2023-05-16 DIAGNOSIS — D631 Anemia in chronic kidney disease: Secondary | ICD-10-CM | POA: Diagnosis not present

## 2023-05-16 DIAGNOSIS — Z992 Dependence on renal dialysis: Secondary | ICD-10-CM | POA: Diagnosis not present

## 2023-05-16 DIAGNOSIS — N186 End stage renal disease: Secondary | ICD-10-CM | POA: Diagnosis not present

## 2023-05-18 DIAGNOSIS — Z992 Dependence on renal dialysis: Secondary | ICD-10-CM | POA: Diagnosis not present

## 2023-05-18 DIAGNOSIS — N2581 Secondary hyperparathyroidism of renal origin: Secondary | ICD-10-CM | POA: Diagnosis not present

## 2023-05-18 DIAGNOSIS — N186 End stage renal disease: Secondary | ICD-10-CM | POA: Diagnosis not present

## 2023-05-18 DIAGNOSIS — D689 Coagulation defect, unspecified: Secondary | ICD-10-CM | POA: Diagnosis not present

## 2023-05-18 DIAGNOSIS — D631 Anemia in chronic kidney disease: Secondary | ICD-10-CM | POA: Diagnosis not present

## 2023-05-18 DIAGNOSIS — D509 Iron deficiency anemia, unspecified: Secondary | ICD-10-CM | POA: Diagnosis not present

## 2023-05-21 DIAGNOSIS — D631 Anemia in chronic kidney disease: Secondary | ICD-10-CM | POA: Diagnosis not present

## 2023-05-21 DIAGNOSIS — Z992 Dependence on renal dialysis: Secondary | ICD-10-CM | POA: Diagnosis not present

## 2023-05-21 DIAGNOSIS — N186 End stage renal disease: Secondary | ICD-10-CM | POA: Diagnosis not present

## 2023-05-21 DIAGNOSIS — D689 Coagulation defect, unspecified: Secondary | ICD-10-CM | POA: Diagnosis not present

## 2023-05-21 DIAGNOSIS — D509 Iron deficiency anemia, unspecified: Secondary | ICD-10-CM | POA: Diagnosis not present

## 2023-05-21 DIAGNOSIS — N2581 Secondary hyperparathyroidism of renal origin: Secondary | ICD-10-CM | POA: Diagnosis not present

## 2023-05-23 DIAGNOSIS — D631 Anemia in chronic kidney disease: Secondary | ICD-10-CM | POA: Diagnosis not present

## 2023-05-23 DIAGNOSIS — D689 Coagulation defect, unspecified: Secondary | ICD-10-CM | POA: Diagnosis not present

## 2023-05-23 DIAGNOSIS — N2581 Secondary hyperparathyroidism of renal origin: Secondary | ICD-10-CM | POA: Diagnosis not present

## 2023-05-23 DIAGNOSIS — D509 Iron deficiency anemia, unspecified: Secondary | ICD-10-CM | POA: Diagnosis not present

## 2023-05-23 DIAGNOSIS — Z992 Dependence on renal dialysis: Secondary | ICD-10-CM | POA: Diagnosis not present

## 2023-05-23 DIAGNOSIS — N186 End stage renal disease: Secondary | ICD-10-CM | POA: Diagnosis not present

## 2023-05-24 ENCOUNTER — Encounter: Payer: Self-pay | Admitting: Nurse Practitioner

## 2023-05-24 ENCOUNTER — Ambulatory Visit (INDEPENDENT_AMBULATORY_CARE_PROVIDER_SITE_OTHER): Payer: Medicare Other | Admitting: Nurse Practitioner

## 2023-05-24 VITALS — BP 150/60 | HR 71 | Temp 98.6°F | Resp 16 | Ht 63.0 in | Wt 116.2 lb

## 2023-05-24 DIAGNOSIS — R300 Dysuria: Secondary | ICD-10-CM | POA: Diagnosis not present

## 2023-05-24 DIAGNOSIS — Z1231 Encounter for screening mammogram for malignant neoplasm of breast: Secondary | ICD-10-CM

## 2023-05-24 DIAGNOSIS — E538 Deficiency of other specified B group vitamins: Secondary | ICD-10-CM | POA: Diagnosis not present

## 2023-05-24 DIAGNOSIS — Z0001 Encounter for general adult medical examination with abnormal findings: Secondary | ICD-10-CM

## 2023-05-24 DIAGNOSIS — R630 Anorexia: Secondary | ICD-10-CM

## 2023-05-24 DIAGNOSIS — R634 Abnormal weight loss: Secondary | ICD-10-CM | POA: Diagnosis not present

## 2023-05-24 DIAGNOSIS — E559 Vitamin D deficiency, unspecified: Secondary | ICD-10-CM | POA: Diagnosis not present

## 2023-05-24 DIAGNOSIS — E782 Mixed hyperlipidemia: Secondary | ICD-10-CM

## 2023-05-24 DIAGNOSIS — E2839 Other primary ovarian failure: Secondary | ICD-10-CM

## 2023-05-24 MED ORDER — DRONABINOL 5 MG PO CAPS: ORAL_CAPSULE | ORAL | 2 refills | Status: DC

## 2023-05-24 NOTE — Progress Notes (Signed)
[facility] [address] [place], [place] [address]  Internal MEDICINE  Office Visit Note  Patient Name: Kristen Francis  [id number]  [id number]  Date of Service: 05/24/2023  Chief Complaint  Patient presents with   Hyperlipidemia   Hypertension   Medicare Wellness    HPI Kristen Francis presents for an annual well visit and physical exam.  Well-appearing 77 y.o. female with end-stage renal disease on dialysis, chronic anemia, hypertension, diabetes, secondary hyperparathyroidism, GERD, aortic atherosclerosis, peripheral vascular disease, and a history of stroke and TIA.  Routine CRC screening: done in march 2023, due in 2028 Routine mammogram: due now DEXA scan:due now Eye exam: foot exam: Labs: due for lipid panel, vitamin D and vitamin B12 labs. The rest of her labs are done routinely by the dialysis center New or worsening pain: none --Lost 3 lbs since last visit, Still has poor appetite, Nausea is better --She has an arteriovenous graft on the left upper extremity and she goes to dialysis 3 times a week.  The AVG is monitored by [facility]. She has dialysis 3 times a week.       05/24/2023    9:45 AM 04/12/2022   10:10 AM 01/24/2021    2:04 PM  MMSE - Mini Mental State Exam  Orientation to time 5 5 5   Orientation to Place 5 5 5   Registration 3 3 3   Attention/ Calculation 5 5 5   Recall 3 3 3   Language- name 2 objects 2 2 2   Language- repeat 1 1 1   Language- follow 3 step command 3 3 3   Language- read & follow direction 1 1 1   Write a sentence 0 1 0  Copy design 1 1 1   Total score 29 30 29     Functional Status Survey: Is the patient deaf or have difficulty hearing?: No Does the patient have difficulty seeing, even when wearing glasses/contacts?: Yes Does the patient have difficulty concentrating, remembering, or making decisions?: No Does the patient have difficulty walking or climbing stairs?: Yes Does the patient have difficulty  dressing or bathing?: No Does the patient have difficulty doing errands alone such as visiting a doctor's office or shopping?: No     06/04/2021    6:00 AM 04/12/2022   10:08 AM 09/06/2022   10:48 AM 12/11/2022   11:20 AM 05/24/2023    9:43 AM  Fall Risk  Falls in the past year?  1 1 0 1  Was there an injury with Fall?  0 0 0 0  Fall Risk Category Calculator  2 1 0 1  Fall Risk Category (Retired)  Moderate Low Low   (RETIRED) Patient Fall Risk Level Moderate fall risk Moderate fall risk Low fall risk Low fall risk   Patient at Risk for Falls Due to   Impaired balance/gait No Fall Risks   Fall risk Follow up  Falls evaluation completed Falls evaluation completed Falls evaluation completed Falls evaluation completed       05/24/2023    9:43 AM  Depression screen PHQ 2/9  Decreased Interest 0  Down, Depressed, Hopeless 0  PHQ - 2 Score 0        No data to display            Current Medication: Outpatient Encounter Medications as of 05/24/2023  Medication Sig   amLODipine (NORVASC) 10 MG tablet TAKE 1 TABLET(10 MG) BY MOUTH DAILY   aspirin EC 81 MG EC tablet Take 1 tablet (81 mg  total) by mouth daily. Swallow whole.   B Complex-C-Folic Acid (RENAL-VITE) 0.8 MG TABS Take 0.8 mg by mouth every evening. On dialysis days   benzonatate (TESSALON) 100 MG capsule Take 1 capsule (100 mg total) by mouth 2 (two) times daily as needed for cough.   carvedilol (COREG) 25 MG tablet TAKE 1 TABLET(25 MG) BY MOUTH TWICE DAILY WITH A MEAL   cholecalciferol (VITAMIN D) 1000 units tablet Take 1,000 Units by mouth daily.    cinacalcet (SENSIPAR) 60 MG tablet Take 60 mg by mouth every evening.   dronabinol (MARINOL) 5 MG capsule Take 1 capsule by mouth before meals twice daily on non-dialysis days   ferrous sulfate 325 (65 FE) MG tablet Take 1 tablet (325 mg total) by mouth 2 (two) times daily with a meal.   furosemide (LASIX) 80 MG tablet Take 1 tablet (80 mg total) by mouth 4 (four) times a week. On  non-dialysis days   hydrALAZINE (APRESOLINE) 50 MG tablet Take 1 tablet (50 mg total) by mouth 3 (three) times daily. Increased from 25 mg.   HYDROcodone-acetaminophen (NORCO/VICODIN) 5-325 MG tablet Take 1 tablet by mouth every 4 (four) hours as needed for moderate pain.   lidocaine-prilocaine (EMLA) cream APPLY SMALL AMOUNT TO ACCESS SITE (AVF) 3 TIMES A WEEK 1 HOUR BEFORE DIALYSIS. COVER WITH OCCLUSIVE DRESSING (SARAN WRAP)   losartan (COZAAR) 50 MG tablet Take 1 tablet (50 mg total) by mouth daily.   metoCLOPramide (REGLAN) 5 MG tablet Take 1 tablet (5 mg total) by mouth 4 (four) times daily -  before meals and at bedtime.   omeprazole (PRILOSEC) 40 MG capsule TAKE 1 CAPSULE(40 MG) BY MOUTH DAILY   predniSONE (DELTASONE) 50 MG tablet Take 1 tablet (50 mg total) by mouth daily with breakfast.   rosuvastatin (CRESTOR) 5 MG tablet TAKE 1 TABLET BY MOUTH DAILY. TAKE ON DAYS NOT HAVING DIALYSIS   sennosides-docusate sodium (SENOKOT-S) 8.6-50 MG tablet Take 2 tablets by mouth daily.   sevelamer carbonate (RENVELA) 800 MG tablet Take 1,600 mg by mouth 3 (three) times daily.   VELPHORO 500 MG chewable tablet Chew 500 mg by mouth 3 (three) times daily.   [DISCONTINUED] mirtazapine (REMERON) 7.5 MG tablet Take 1 tablet (7.5 mg total) by mouth at bedtime. (Patient not taking: No sig reported)   [DISCONTINUED] promethazine (PHENERGAN) 12.5 MG tablet Take 1 tablet (12.5 mg total) by mouth every 8 (eight) hours as needed for nausea or vomiting. (Patient not taking: No sig reported)   No facility-administered encounter medications on file as of 05/24/2023.    Surgical History: Past Surgical History:  Procedure Laterality Date   A/V FISTULAGRAM Left 12/25/2018   Procedure: A/V FISTULAGRAM;  Surgeon: Renford Dills, MD;  Location: [facility] INVASIVE CV LAB;  Service: Cardiovascular;  Laterality: Left;   ABDOMINAL HYSTERECTOMY     APPENDECTOMY     AV FISTULA PLACEMENT Left 07/12/2018   Procedure: INSERTION OF  ARTERIOVENOUS (AV) GORE-TEX GRAFT ARM ( BRACHIAL AXILLARY );  Surgeon: Renford Dills, MD;  Location: [facility] ORS;  Service: Vascular;  Laterality: Left;   COLONOSCOPY WITH PROPOFOL N/A 01/14/2016   Procedure: COLONOSCOPY WITH PROPOFOL;  Surgeon: Scot Jun, MD;  Location: [facility] ENDOSCOPY;  Service: Endoscopy;  Laterality: N/A;   COLONOSCOPY WITH PROPOFOL N/A 02/14/2022   Procedure: COLONOSCOPY WITH PROPOFOL;  Surgeon: Regis Bill, MD;  Location: [facility] ENDOSCOPY;  Service: Endoscopy;  Laterality: N/A;   TONSILLECTOMY      Medical History: Past Medical History:  Diagnosis Date   Anemia    Arthritis    Chronic kidney disease    ESRD   Complication of anesthesia    had procedure and felt incision early 80s   Gout    Hyperlipemia    Hypertension    Macular degeneration, right eye    Stroke [facility])     Family History: Family History  Problem Relation Age of Onset   Kidney disease Mother    Breast cancer Neg Hx     Social History   Socioeconomic History   Marital status: Married    Spouse name: Not on file   Number of children: Not on file   Years of education: Not on file   Highest education level: Not on file  Occupational History   Not on file  Tobacco Use   Smoking status: Former   Smokeless tobacco: Never  Vaping Use   Vaping Use: Never used  Substance and Sexual Activity   Alcohol use: No   Drug use: No   Sexual activity: Not on file  Other Topics Concern   Not on file  Social History Narrative   Lives in  [place]; used to clean; no smoking; no alcohol.    Social Determinants of Health   Financial Resource Strain: Not on file  Food Insecurity: Not on file  Transportation Needs: Not on file  Physical Activity: Not on file  Stress: Not on file  Social Connections: Not on file  Intimate Partner Violence: Not on file      Review of Systems  Constitutional:  Positive for appetite change, fatigue (Reports this is typical the day after she has  dialysis) and unexpected weight change. Negative for activity change, chills and fever.  HENT: Negative.  Negative for congestion, ear pain, rhinorrhea, sore throat and trouble swallowing.   Eyes: Negative.   Respiratory: Negative.  Negative for cough, chest tightness, shortness of breath and wheezing.   Cardiovascular: Negative.  Negative for chest pain and palpitations.  Gastrointestinal: Negative.  Negative for abdominal pain, blood in stool, constipation, diarrhea, nausea and vomiting.  Endocrine: Negative.   Genitourinary:  Positive for decreased urine volume. Negative for difficulty urinating, dysuria, frequency, hematuria, pelvic pain and urgency.       Patient has and arteriovenous graft on the left upper extremity  Musculoskeletal:  Positive for arthralgias. Negative for back pain, joint swelling, myalgias and neck pain.  Skin: Negative.  Negative for rash and wound.  Allergic/Immunologic: Negative.  Negative for immunocompromised state.  Neurological: Negative.  Negative for dizziness, seizures, numbness and headaches.  Hematological: Negative.   Psychiatric/Behavioral: Negative.  Negative for behavioral problems, self-injury and suicidal ideas. The patient is not nervous/anxious.     Vital Signs: BP (!) 150/60 Comment: 170/80  Pulse 71   Temp 98.6 F (37 C)   Resp 16   Ht 5\' 3"  (1.6 m)   Wt 116 lb 3.2 oz (52.7 kg)   SpO2 99%   BMI 20.58 kg/m    Physical Exam Vitals reviewed.  Constitutional:      General: She is awake. She is not in acute distress.    Appearance: Normal appearance. She is well-developed, well-groomed and normal weight. She is ill-appearing. She is not diaphoretic.  HENT:     Head: Normocephalic and atraumatic.     Right Ear: Tympanic membrane, ear canal and external ear normal.     Left Ear: Tympanic membrane, ear canal and external ear normal.     Nose: Nose  normal. No congestion or rhinorrhea.     Mouth/Throat:     Mouth: Mucous membranes are  moist.     Pharynx: Oropharynx is clear. No oropharyngeal exudate or posterior oropharyngeal erythema.  Eyes:     General: No scleral icterus.       Right eye: No discharge.        Left eye: No discharge.     Extraocular Movements: Extraocular movements intact.     Conjunctiva/sclera: Conjunctivae normal.     Pupils: Pupils are equal, round, and reactive to light.  Neck:     Thyroid: No thyromegaly.     Vascular: No carotid bruit or JVD.     Trachea: No tracheal deviation.  Cardiovascular:     Rate and Rhythm: Normal rate and regular rhythm.     Pulses:          Dorsalis pedis pulses are 1+ on the right side and 1+ on the left side.       Posterior tibial pulses are 1+ on the right side and 1+ on the left side.     Heart sounds: Murmur heard.     No friction rub. No gallop.     Arteriovenous access: Left arteriovenous access is present. Pulmonary:     Effort: Pulmonary effort is normal. No accessory muscle usage or respiratory distress.     Breath sounds: Normal breath sounds and air entry. No stridor. No wheezing or rales.  Chest:     Chest wall: No tenderness.     Comments: Declined clinical breast exam Abdominal:     General: Bowel sounds are normal. There is no distension.     Palpations: Abdomen is soft. There is no shifting dullness, fluid wave, mass or pulsatile mass.     Tenderness: There is no abdominal tenderness. There is no guarding or rebound.  Musculoskeletal:        General: No tenderness or deformity. Normal range of motion.     Cervical back: Normal range of motion and neck supple.     Right lower leg: No edema.     Left lower leg: No edema.     Right foot: Normal range of motion. No deformity, bunion, Charcot foot, foot drop or prominent metatarsal heads.     Left foot: Normal range of motion. No deformity, bunion, Charcot foot, foot drop or prominent metatarsal heads.  Feet:     Right foot:     Protective Sensation: 6 sites tested.  6 sites sensed.     Skin  integrity: Dry skin present. No ulcer, blister, skin breakdown, erythema, warmth, callus or fissure.     Toenail Condition: Right toenails are abnormally thick.     Left foot:     Protective Sensation: 6 sites tested.  6 sites sensed.     Skin integrity: Dry skin present. No ulcer, blister, skin breakdown, erythema, warmth, callus or fissure.     Toenail Condition: Left toenails are abnormally thick.  Lymphadenopathy:     Cervical: No cervical adenopathy.  Skin:    General: Skin is warm and dry.     Capillary Refill: Capillary refill takes less than 2 seconds.     Coloration: Skin is not pale.     Findings: No erythema or rash.  Neurological:     Mental Status: She is alert and oriented to person, place, and time.     Cranial Nerves: No cranial nerve deficit.     Motor: No abnormal muscle tone.  Coordination: Coordination normal.     Gait: Gait normal.     Deep Tendon Reflexes: Reflexes are normal and symmetric.  Psychiatric:        Mood and Affect: Mood normal.        Behavior: Behavior normal. Behavior is cooperative.        Thought Content: Thought content normal.        Judgment: Judgment normal.        Assessment/Plan: 1. Encounter for routine adult health examination with abnormal findings Age-appropriate preventive screenings and vaccinations discussed, annual physical exam completed. Routine labs for health maintenance ordered, see below. PHM updated.  - Lipid Profile - B12 and Folate Panel - Vitamin D (25 hydroxy)  2. Poor appetite Take dronabinol to increase appetite. Cannot take mirtazapine or megestrol due to ESRD.  - dronabinol (MARINOL) 5 MG capsule; Take 1 capsule by mouth before meals twice daily on non-dialysis days  Dispense: 40 capsule; Refill: 2  3. Unintentional weight loss Will try dronabinol to increase appetite. Due to ESRD, she cannot take mirtazapine or megestrol.  - dronabinol (MARINOL) 5 MG capsule; Take 1 capsule by mouth before meals twice  daily on non-dialysis days  Dispense: 40 capsule; Refill: 2  4. Mixed hyperlipidemia Routine lab ordered  - Lipid Profile  5. B12 deficiency Routine labs ordered  - B12 and Folate Panel  6. Ovarian failure due to menopause Dexa scan ordered - DG Bone Density; Future  7. Encounter for screening mammogram for malignant neoplasm of breast Routine mammogram ordered  - MM 3D SCREENING MAMMOGRAM BILATERAL BREAST; Future  8. Dysuria Routine urinalysis done  - UA/M w/rflx Culture, Routine  9. Vitamin D deficiency Routine lab ordered - Vitamin D (25 hydroxy)      General Counseling: Kristen Francis verbalizes understanding of the findings of todays visit and agrees with plan of treatment. I have discussed any further diagnostic evaluation that may be needed or ordered today. We also reviewed her medications today. she has been encouraged to call the office with any questions or concerns that should arise related to todays visit.    Orders Placed This Encounter  Procedures   MM 3D SCREENING MAMMOGRAM BILATERAL BREAST   DG Bone Density   Lipid Profile   B12 and Folate Panel   Vitamin D (25 hydroxy)   UA/M w/rflx Culture, Routine    Meds ordered this encounter  Medications   dronabinol (MARINOL) 5 MG capsule    Sig: Take 1 capsule by mouth before meals twice daily on non-dialysis days    Dispense:  40 capsule    Refill:  2    Return in about 1 month (around 06/23/2023) for F/U, Khristian Phillippi PCP, eval new med.   Total time spent:30 Minutes Time spent includes review of chart, medications, test results, and follow up plan with the patient.   [place] Controlled Substance Database was reviewed by me.  This patient was seen by Sallyanne Kuster, FNP-C in collaboration with Dr. Beverely Risen as a part of collaborative care agreement.  Berlyn Saylor R. Tedd Sias, MSN, FNP-C Internal medicine

## 2023-05-24 NOTE — Patient Instructions (Signed)
Try Nepro nutritional drink/shakes , these are for people with kidney disease.

## 2023-05-25 DIAGNOSIS — Z992 Dependence on renal dialysis: Secondary | ICD-10-CM | POA: Diagnosis not present

## 2023-05-25 DIAGNOSIS — D631 Anemia in chronic kidney disease: Secondary | ICD-10-CM | POA: Diagnosis not present

## 2023-05-25 DIAGNOSIS — D509 Iron deficiency anemia, unspecified: Secondary | ICD-10-CM | POA: Diagnosis not present

## 2023-05-25 DIAGNOSIS — D689 Coagulation defect, unspecified: Secondary | ICD-10-CM | POA: Diagnosis not present

## 2023-05-25 DIAGNOSIS — N186 End stage renal disease: Secondary | ICD-10-CM | POA: Diagnosis not present

## 2023-05-25 DIAGNOSIS — N2581 Secondary hyperparathyroidism of renal origin: Secondary | ICD-10-CM | POA: Diagnosis not present

## 2023-05-25 LAB — LIPID PANEL
Chol/HDL Ratio: 3.4 ratio (ref 0.0–4.4)
Cholesterol, Total: 301 mg/dL — ABNORMAL HIGH (ref 100–199)
HDL: 89 mg/dL (ref >39)
LDL Chol Calc (NIH): 183 mg/dL — ABNORMAL HIGH (ref 0–99)
Triglycerides: 160 mg/dL — ABNORMAL HIGH (ref 0–149)
VLDL Cholesterol Cal: 29 mg/dL (ref 5–40)

## 2023-05-25 LAB — UA/M W/RFLX CULTURE, ROUTINE
Bilirubin, UA: NEGATIVE
Glucose, UA: NEGATIVE
Ketones, UA: NEGATIVE
Leukocytes,UA: NEGATIVE
Nitrite, UA: NEGATIVE
RBC, UA: NEGATIVE
Specific Gravity, UA: 1.012 (ref 1.005–1.030)
Urobilinogen, Ur: 0.2 mg/dL (ref 0.2–1.0)
pH, UA: 8.5 — ABNORMAL HIGH (ref 5.0–7.5)

## 2023-05-25 LAB — MICROSCOPIC EXAMINATION
Casts: NONE SEEN /lpf
Epithelial Cells (non renal): >10 /hpf — AB (ref 0–10)
RBC, Urine: NONE SEEN /hpf (ref 0–2)

## 2023-05-25 LAB — B12 AND FOLATE PANEL
Folate: 9.1 ng/mL (ref >3.0)
Vitamin B-12: 930 pg/mL (ref 232–1245)

## 2023-05-25 LAB — VITAMIN D 25 HYDROXY (VIT D DEFICIENCY, FRACTURES): Vit D, 25-Hydroxy: 24.6 ng/mL — ABNORMAL LOW (ref 30.0–100.0)

## 2023-05-28 ENCOUNTER — Telehealth: Payer: Self-pay

## 2023-05-28 DIAGNOSIS — D509 Iron deficiency anemia, unspecified: Secondary | ICD-10-CM | POA: Diagnosis not present

## 2023-05-28 DIAGNOSIS — Z992 Dependence on renal dialysis: Secondary | ICD-10-CM | POA: Diagnosis not present

## 2023-05-28 DIAGNOSIS — N2581 Secondary hyperparathyroidism of renal origin: Secondary | ICD-10-CM | POA: Diagnosis not present

## 2023-05-28 DIAGNOSIS — D689 Coagulation defect, unspecified: Secondary | ICD-10-CM | POA: Diagnosis not present

## 2023-05-28 DIAGNOSIS — D631 Anemia in chronic kidney disease: Secondary | ICD-10-CM | POA: Diagnosis not present

## 2023-05-28 DIAGNOSIS — N186 End stage renal disease: Secondary | ICD-10-CM | POA: Diagnosis not present

## 2023-05-29 ENCOUNTER — Telehealth: Payer: Self-pay | Admitting: Nurse Practitioner

## 2023-05-29 ENCOUNTER — Telehealth: Payer: Self-pay

## 2023-05-29 NOTE — Telephone Encounter (Signed)
Patient called regarding droNABinol denial. I explained to her we are submitting appeal to her insurance-[employer]

## 2023-05-29 NOTE — Telephone Encounter (Signed)
Pt advised as per alyssa take Vitamin B12 1000 mcg daily and take vitamin D 2000 IU  daily

## 2023-05-30 ENCOUNTER — Telehealth: Payer: Self-pay

## 2023-05-30 DIAGNOSIS — N2581 Secondary hyperparathyroidism of renal origin: Secondary | ICD-10-CM | POA: Diagnosis not present

## 2023-05-30 DIAGNOSIS — Z992 Dependence on renal dialysis: Secondary | ICD-10-CM | POA: Diagnosis not present

## 2023-05-30 DIAGNOSIS — N186 End stage renal disease: Secondary | ICD-10-CM | POA: Diagnosis not present

## 2023-05-30 DIAGNOSIS — D509 Iron deficiency anemia, unspecified: Secondary | ICD-10-CM | POA: Diagnosis not present

## 2023-05-30 DIAGNOSIS — D689 Coagulation defect, unspecified: Secondary | ICD-10-CM | POA: Diagnosis not present

## 2023-05-30 DIAGNOSIS — D631 Anemia in chronic kidney disease: Secondary | ICD-10-CM | POA: Diagnosis not present

## 2023-05-30 NOTE — Telephone Encounter (Signed)
Appeal was fax and was success today. 05-30-23

## 2023-05-30 NOTE — Telephone Encounter (Signed)
Appeal was done and fax today.

## 2023-06-01 DIAGNOSIS — D689 Coagulation defect, unspecified: Secondary | ICD-10-CM | POA: Diagnosis not present

## 2023-06-01 DIAGNOSIS — D631 Anemia in chronic kidney disease: Secondary | ICD-10-CM | POA: Diagnosis not present

## 2023-06-01 DIAGNOSIS — D509 Iron deficiency anemia, unspecified: Secondary | ICD-10-CM | POA: Diagnosis not present

## 2023-06-01 DIAGNOSIS — N2581 Secondary hyperparathyroidism of renal origin: Secondary | ICD-10-CM | POA: Diagnosis not present

## 2023-06-01 DIAGNOSIS — Z992 Dependence on renal dialysis: Secondary | ICD-10-CM | POA: Diagnosis not present

## 2023-06-01 DIAGNOSIS — N186 End stage renal disease: Secondary | ICD-10-CM | POA: Diagnosis not present

## 2023-06-03 ENCOUNTER — Other Ambulatory Visit: Payer: Self-pay | Admitting: Nurse Practitioner

## 2023-06-03 DIAGNOSIS — I10 Essential (primary) hypertension: Secondary | ICD-10-CM

## 2023-06-03 DIAGNOSIS — N186 End stage renal disease: Secondary | ICD-10-CM | POA: Diagnosis not present

## 2023-06-03 DIAGNOSIS — N040 Nephrotic syndrome with minor glomerular abnormality: Secondary | ICD-10-CM | POA: Diagnosis not present

## 2023-06-03 DIAGNOSIS — Z992 Dependence on renal dialysis: Secondary | ICD-10-CM | POA: Diagnosis not present

## 2023-06-04 DIAGNOSIS — D509 Iron deficiency anemia, unspecified: Secondary | ICD-10-CM | POA: Diagnosis not present

## 2023-06-04 DIAGNOSIS — N2581 Secondary hyperparathyroidism of renal origin: Secondary | ICD-10-CM | POA: Diagnosis not present

## 2023-06-04 DIAGNOSIS — Z992 Dependence on renal dialysis: Secondary | ICD-10-CM | POA: Diagnosis not present

## 2023-06-04 DIAGNOSIS — D631 Anemia in chronic kidney disease: Secondary | ICD-10-CM | POA: Diagnosis not present

## 2023-06-04 DIAGNOSIS — D689 Coagulation defect, unspecified: Secondary | ICD-10-CM | POA: Diagnosis not present

## 2023-06-04 DIAGNOSIS — N186 End stage renal disease: Secondary | ICD-10-CM | POA: Diagnosis not present

## 2023-06-04 DIAGNOSIS — R519 Headache, unspecified: Secondary | ICD-10-CM | POA: Diagnosis not present

## 2023-06-04 MED ORDER — AMLODIPINE BESYLATE 10 MG PO TABS: ORAL_TABLET | ORAL | 3 refills | Status: DC

## 2023-06-04 NOTE — Telephone Encounter (Signed)
Ok to fill she is on so many BP med please review

## 2023-06-04 NOTE — Addendum Note (Signed)
Addended by: Sallyanne Kuster on: 06/04/2023 11:13 AM   Modules accepted: Orders

## 2023-06-05 ENCOUNTER — Telehealth: Payer: Self-pay

## 2023-06-05 ENCOUNTER — Other Ambulatory Visit: Payer: Self-pay | Admitting: Nurse Practitioner

## 2023-06-05 MED ORDER — ROSUVASTATIN CALCIUM 10 MG PO TABS: ORAL_TABLET | ORAL | 1 refills | Status: DC

## 2023-06-05 NOTE — Telephone Encounter (Signed)
-----   Message from Sallyanne Kuster, NP sent at 06/05/2023  7:55 AM EDT ----- Cholesterol is very elevated, we need to increase the rosuvastatin dose to 10 mg daily, I have already ordered this.  Continue OTC vitamin D supplement B12 and folate levels are normal.

## 2023-06-05 NOTE — Telephone Encounter (Signed)
Pt was notified.  

## 2023-06-06 DIAGNOSIS — D631 Anemia in chronic kidney disease: Secondary | ICD-10-CM | POA: Diagnosis not present

## 2023-06-06 DIAGNOSIS — R519 Headache, unspecified: Secondary | ICD-10-CM | POA: Diagnosis not present

## 2023-06-06 DIAGNOSIS — Z992 Dependence on renal dialysis: Secondary | ICD-10-CM | POA: Diagnosis not present

## 2023-06-06 DIAGNOSIS — N2581 Secondary hyperparathyroidism of renal origin: Secondary | ICD-10-CM | POA: Diagnosis not present

## 2023-06-06 DIAGNOSIS — N186 End stage renal disease: Secondary | ICD-10-CM | POA: Diagnosis not present

## 2023-06-06 DIAGNOSIS — D509 Iron deficiency anemia, unspecified: Secondary | ICD-10-CM | POA: Diagnosis not present

## 2023-06-06 DIAGNOSIS — D689 Coagulation defect, unspecified: Secondary | ICD-10-CM | POA: Diagnosis not present

## 2023-06-08 DIAGNOSIS — N2581 Secondary hyperparathyroidism of renal origin: Secondary | ICD-10-CM | POA: Diagnosis not present

## 2023-06-08 DIAGNOSIS — D631 Anemia in chronic kidney disease: Secondary | ICD-10-CM | POA: Diagnosis not present

## 2023-06-08 DIAGNOSIS — Z992 Dependence on renal dialysis: Secondary | ICD-10-CM | POA: Diagnosis not present

## 2023-06-08 DIAGNOSIS — R519 Headache, unspecified: Secondary | ICD-10-CM | POA: Diagnosis not present

## 2023-06-08 DIAGNOSIS — N186 End stage renal disease: Secondary | ICD-10-CM | POA: Diagnosis not present

## 2023-06-08 DIAGNOSIS — D509 Iron deficiency anemia, unspecified: Secondary | ICD-10-CM | POA: Diagnosis not present

## 2023-06-08 DIAGNOSIS — D689 Coagulation defect, unspecified: Secondary | ICD-10-CM | POA: Diagnosis not present

## 2023-06-11 DIAGNOSIS — R519 Headache, unspecified: Secondary | ICD-10-CM | POA: Diagnosis not present

## 2023-06-11 DIAGNOSIS — N186 End stage renal disease: Secondary | ICD-10-CM | POA: Diagnosis not present

## 2023-06-11 DIAGNOSIS — Z992 Dependence on renal dialysis: Secondary | ICD-10-CM | POA: Diagnosis not present

## 2023-06-11 DIAGNOSIS — N2581 Secondary hyperparathyroidism of renal origin: Secondary | ICD-10-CM | POA: Diagnosis not present

## 2023-06-11 DIAGNOSIS — D689 Coagulation defect, unspecified: Secondary | ICD-10-CM | POA: Diagnosis not present

## 2023-06-11 DIAGNOSIS — D631 Anemia in chronic kidney disease: Secondary | ICD-10-CM | POA: Diagnosis not present

## 2023-06-11 DIAGNOSIS — D509 Iron deficiency anemia, unspecified: Secondary | ICD-10-CM | POA: Diagnosis not present

## 2023-06-13 DIAGNOSIS — N2581 Secondary hyperparathyroidism of renal origin: Secondary | ICD-10-CM | POA: Diagnosis not present

## 2023-06-13 DIAGNOSIS — D631 Anemia in chronic kidney disease: Secondary | ICD-10-CM | POA: Diagnosis not present

## 2023-06-13 DIAGNOSIS — N186 End stage renal disease: Secondary | ICD-10-CM | POA: Diagnosis not present

## 2023-06-13 DIAGNOSIS — Z992 Dependence on renal dialysis: Secondary | ICD-10-CM | POA: Diagnosis not present

## 2023-06-13 DIAGNOSIS — D689 Coagulation defect, unspecified: Secondary | ICD-10-CM | POA: Diagnosis not present

## 2023-06-13 DIAGNOSIS — R519 Headache, unspecified: Secondary | ICD-10-CM | POA: Diagnosis not present

## 2023-06-13 DIAGNOSIS — D509 Iron deficiency anemia, unspecified: Secondary | ICD-10-CM | POA: Diagnosis not present

## 2023-06-15 DIAGNOSIS — D631 Anemia in chronic kidney disease: Secondary | ICD-10-CM | POA: Diagnosis not present

## 2023-06-15 DIAGNOSIS — R519 Headache, unspecified: Secondary | ICD-10-CM | POA: Diagnosis not present

## 2023-06-15 DIAGNOSIS — D689 Coagulation defect, unspecified: Secondary | ICD-10-CM | POA: Diagnosis not present

## 2023-06-15 DIAGNOSIS — N186 End stage renal disease: Secondary | ICD-10-CM | POA: Diagnosis not present

## 2023-06-15 DIAGNOSIS — Z992 Dependence on renal dialysis: Secondary | ICD-10-CM | POA: Diagnosis not present

## 2023-06-15 DIAGNOSIS — N2581 Secondary hyperparathyroidism of renal origin: Secondary | ICD-10-CM | POA: Diagnosis not present

## 2023-06-15 DIAGNOSIS — D509 Iron deficiency anemia, unspecified: Secondary | ICD-10-CM | POA: Diagnosis not present

## 2023-06-18 DIAGNOSIS — D689 Coagulation defect, unspecified: Secondary | ICD-10-CM | POA: Diagnosis not present

## 2023-06-18 DIAGNOSIS — N2581 Secondary hyperparathyroidism of renal origin: Secondary | ICD-10-CM | POA: Diagnosis not present

## 2023-06-18 DIAGNOSIS — D509 Iron deficiency anemia, unspecified: Secondary | ICD-10-CM | POA: Diagnosis not present

## 2023-06-18 DIAGNOSIS — D631 Anemia in chronic kidney disease: Secondary | ICD-10-CM | POA: Diagnosis not present

## 2023-06-18 DIAGNOSIS — Z992 Dependence on renal dialysis: Secondary | ICD-10-CM | POA: Diagnosis not present

## 2023-06-18 DIAGNOSIS — N186 End stage renal disease: Secondary | ICD-10-CM | POA: Diagnosis not present

## 2023-06-18 DIAGNOSIS — R519 Headache, unspecified: Secondary | ICD-10-CM | POA: Diagnosis not present

## 2023-06-20 DIAGNOSIS — R519 Headache, unspecified: Secondary | ICD-10-CM | POA: Diagnosis not present

## 2023-06-20 DIAGNOSIS — N186 End stage renal disease: Secondary | ICD-10-CM | POA: Diagnosis not present

## 2023-06-20 DIAGNOSIS — N2581 Secondary hyperparathyroidism of renal origin: Secondary | ICD-10-CM | POA: Diagnosis not present

## 2023-06-20 DIAGNOSIS — D689 Coagulation defect, unspecified: Secondary | ICD-10-CM | POA: Diagnosis not present

## 2023-06-20 DIAGNOSIS — D631 Anemia in chronic kidney disease: Secondary | ICD-10-CM | POA: Diagnosis not present

## 2023-06-20 DIAGNOSIS — Z992 Dependence on renal dialysis: Secondary | ICD-10-CM | POA: Diagnosis not present

## 2023-06-20 DIAGNOSIS — D509 Iron deficiency anemia, unspecified: Secondary | ICD-10-CM | POA: Diagnosis not present

## 2023-06-21 ENCOUNTER — Ambulatory Visit: Payer: Medicare Other | Admitting: Nurse Practitioner

## 2023-06-25 DIAGNOSIS — N2581 Secondary hyperparathyroidism of renal origin: Secondary | ICD-10-CM | POA: Diagnosis not present

## 2023-06-25 DIAGNOSIS — R519 Headache, unspecified: Secondary | ICD-10-CM | POA: Diagnosis not present

## 2023-06-25 DIAGNOSIS — D689 Coagulation defect, unspecified: Secondary | ICD-10-CM | POA: Diagnosis not present

## 2023-06-25 DIAGNOSIS — D509 Iron deficiency anemia, unspecified: Secondary | ICD-10-CM | POA: Diagnosis not present

## 2023-06-25 DIAGNOSIS — Z992 Dependence on renal dialysis: Secondary | ICD-10-CM | POA: Diagnosis not present

## 2023-06-25 DIAGNOSIS — N186 End stage renal disease: Secondary | ICD-10-CM | POA: Diagnosis not present

## 2023-06-25 DIAGNOSIS — D631 Anemia in chronic kidney disease: Secondary | ICD-10-CM | POA: Diagnosis not present

## 2023-06-27 DIAGNOSIS — D689 Coagulation defect, unspecified: Secondary | ICD-10-CM | POA: Diagnosis not present

## 2023-06-27 DIAGNOSIS — D631 Anemia in chronic kidney disease: Secondary | ICD-10-CM | POA: Diagnosis not present

## 2023-06-27 DIAGNOSIS — Z992 Dependence on renal dialysis: Secondary | ICD-10-CM | POA: Diagnosis not present

## 2023-06-27 DIAGNOSIS — D509 Iron deficiency anemia, unspecified: Secondary | ICD-10-CM | POA: Diagnosis not present

## 2023-06-27 DIAGNOSIS — N2581 Secondary hyperparathyroidism of renal origin: Secondary | ICD-10-CM | POA: Diagnosis not present

## 2023-06-27 DIAGNOSIS — R519 Headache, unspecified: Secondary | ICD-10-CM | POA: Diagnosis not present

## 2023-06-27 DIAGNOSIS — N186 End stage renal disease: Secondary | ICD-10-CM | POA: Diagnosis not present

## 2023-06-29 DIAGNOSIS — R519 Headache, unspecified: Secondary | ICD-10-CM | POA: Diagnosis not present

## 2023-06-29 DIAGNOSIS — N2581 Secondary hyperparathyroidism of renal origin: Secondary | ICD-10-CM | POA: Diagnosis not present

## 2023-06-29 DIAGNOSIS — D631 Anemia in chronic kidney disease: Secondary | ICD-10-CM | POA: Diagnosis not present

## 2023-06-29 DIAGNOSIS — D689 Coagulation defect, unspecified: Secondary | ICD-10-CM | POA: Diagnosis not present

## 2023-06-29 DIAGNOSIS — Z992 Dependence on renal dialysis: Secondary | ICD-10-CM | POA: Diagnosis not present

## 2023-06-29 DIAGNOSIS — D509 Iron deficiency anemia, unspecified: Secondary | ICD-10-CM | POA: Diagnosis not present

## 2023-06-29 DIAGNOSIS — N186 End stage renal disease: Secondary | ICD-10-CM | POA: Diagnosis not present

## 2023-07-02 DIAGNOSIS — D689 Coagulation defect, unspecified: Secondary | ICD-10-CM | POA: Diagnosis not present

## 2023-07-02 DIAGNOSIS — N2581 Secondary hyperparathyroidism of renal origin: Secondary | ICD-10-CM | POA: Diagnosis not present

## 2023-07-02 DIAGNOSIS — Z992 Dependence on renal dialysis: Secondary | ICD-10-CM | POA: Diagnosis not present

## 2023-07-02 DIAGNOSIS — N186 End stage renal disease: Secondary | ICD-10-CM | POA: Diagnosis not present

## 2023-07-02 DIAGNOSIS — D631 Anemia in chronic kidney disease: Secondary | ICD-10-CM | POA: Diagnosis not present

## 2023-07-02 DIAGNOSIS — D509 Iron deficiency anemia, unspecified: Secondary | ICD-10-CM | POA: Diagnosis not present

## 2023-07-02 DIAGNOSIS — R519 Headache, unspecified: Secondary | ICD-10-CM | POA: Diagnosis not present

## 2023-07-04 DIAGNOSIS — R519 Headache, unspecified: Secondary | ICD-10-CM | POA: Diagnosis not present

## 2023-07-04 DIAGNOSIS — D631 Anemia in chronic kidney disease: Secondary | ICD-10-CM | POA: Diagnosis not present

## 2023-07-04 DIAGNOSIS — D509 Iron deficiency anemia, unspecified: Secondary | ICD-10-CM | POA: Diagnosis not present

## 2023-07-04 DIAGNOSIS — Z992 Dependence on renal dialysis: Secondary | ICD-10-CM | POA: Diagnosis not present

## 2023-07-04 DIAGNOSIS — D689 Coagulation defect, unspecified: Secondary | ICD-10-CM | POA: Diagnosis not present

## 2023-07-04 DIAGNOSIS — N040 Nephrotic syndrome with minor glomerular abnormality: Secondary | ICD-10-CM | POA: Diagnosis not present

## 2023-07-04 DIAGNOSIS — N186 End stage renal disease: Secondary | ICD-10-CM | POA: Diagnosis not present

## 2023-07-04 DIAGNOSIS — N2581 Secondary hyperparathyroidism of renal origin: Secondary | ICD-10-CM | POA: Diagnosis not present

## 2023-07-06 DIAGNOSIS — N186 End stage renal disease: Secondary | ICD-10-CM | POA: Diagnosis not present

## 2023-07-06 DIAGNOSIS — Z992 Dependence on renal dialysis: Secondary | ICD-10-CM | POA: Diagnosis not present

## 2023-07-06 DIAGNOSIS — D631 Anemia in chronic kidney disease: Secondary | ICD-10-CM | POA: Diagnosis not present

## 2023-07-06 DIAGNOSIS — D689 Coagulation defect, unspecified: Secondary | ICD-10-CM | POA: Diagnosis not present

## 2023-07-06 DIAGNOSIS — N2581 Secondary hyperparathyroidism of renal origin: Secondary | ICD-10-CM | POA: Diagnosis not present

## 2023-07-09 DIAGNOSIS — N186 End stage renal disease: Secondary | ICD-10-CM | POA: Diagnosis not present

## 2023-07-09 DIAGNOSIS — D631 Anemia in chronic kidney disease: Secondary | ICD-10-CM | POA: Diagnosis not present

## 2023-07-09 DIAGNOSIS — D689 Coagulation defect, unspecified: Secondary | ICD-10-CM | POA: Diagnosis not present

## 2023-07-09 DIAGNOSIS — N2581 Secondary hyperparathyroidism of renal origin: Secondary | ICD-10-CM | POA: Diagnosis not present

## 2023-07-09 DIAGNOSIS — Z992 Dependence on renal dialysis: Secondary | ICD-10-CM | POA: Diagnosis not present

## 2023-07-11 DIAGNOSIS — D631 Anemia in chronic kidney disease: Secondary | ICD-10-CM | POA: Diagnosis not present

## 2023-07-11 DIAGNOSIS — N2581 Secondary hyperparathyroidism of renal origin: Secondary | ICD-10-CM | POA: Diagnosis not present

## 2023-07-11 DIAGNOSIS — N186 End stage renal disease: Secondary | ICD-10-CM | POA: Diagnosis not present

## 2023-07-11 DIAGNOSIS — D689 Coagulation defect, unspecified: Secondary | ICD-10-CM | POA: Diagnosis not present

## 2023-07-11 DIAGNOSIS — Z992 Dependence on renal dialysis: Secondary | ICD-10-CM | POA: Diagnosis not present

## 2023-07-13 DIAGNOSIS — N186 End stage renal disease: Secondary | ICD-10-CM | POA: Diagnosis not present

## 2023-07-13 DIAGNOSIS — D689 Coagulation defect, unspecified: Secondary | ICD-10-CM | POA: Diagnosis not present

## 2023-07-13 DIAGNOSIS — Z992 Dependence on renal dialysis: Secondary | ICD-10-CM | POA: Diagnosis not present

## 2023-07-13 DIAGNOSIS — N2581 Secondary hyperparathyroidism of renal origin: Secondary | ICD-10-CM | POA: Diagnosis not present

## 2023-07-13 DIAGNOSIS — D631 Anemia in chronic kidney disease: Secondary | ICD-10-CM | POA: Diagnosis not present

## 2023-07-16 DIAGNOSIS — N186 End stage renal disease: Secondary | ICD-10-CM | POA: Diagnosis not present

## 2023-07-16 DIAGNOSIS — D689 Coagulation defect, unspecified: Secondary | ICD-10-CM | POA: Diagnosis not present

## 2023-07-16 DIAGNOSIS — Z992 Dependence on renal dialysis: Secondary | ICD-10-CM | POA: Diagnosis not present

## 2023-07-16 DIAGNOSIS — D631 Anemia in chronic kidney disease: Secondary | ICD-10-CM | POA: Diagnosis not present

## 2023-07-16 DIAGNOSIS — N2581 Secondary hyperparathyroidism of renal origin: Secondary | ICD-10-CM | POA: Diagnosis not present

## 2023-07-17 ENCOUNTER — Telehealth: Payer: Self-pay

## 2023-07-17 ENCOUNTER — Ambulatory Visit (INDEPENDENT_AMBULATORY_CARE_PROVIDER_SITE_OTHER): Payer: Medicare Other | Admitting: Nurse Practitioner

## 2023-07-17 ENCOUNTER — Encounter: Payer: Self-pay | Admitting: Nurse Practitioner

## 2023-07-17 VITALS — BP 138/60 | HR 89 | Temp 98.2°F | Resp 16 | Ht 63.0 in | Wt 115.4 lb

## 2023-07-17 DIAGNOSIS — M545 Low back pain, unspecified: Secondary | ICD-10-CM

## 2023-07-17 DIAGNOSIS — I10 Essential (primary) hypertension: Secondary | ICD-10-CM | POA: Diagnosis not present

## 2023-07-17 DIAGNOSIS — R634 Abnormal weight loss: Secondary | ICD-10-CM

## 2023-07-17 DIAGNOSIS — R630 Anorexia: Secondary | ICD-10-CM | POA: Diagnosis not present

## 2023-07-17 DIAGNOSIS — Z992 Dependence on renal dialysis: Secondary | ICD-10-CM | POA: Diagnosis not present

## 2023-07-17 DIAGNOSIS — N186 End stage renal disease: Secondary | ICD-10-CM

## 2023-07-17 NOTE — Progress Notes (Signed)
[facility] [address] [place], [place] [address]  Internal MEDICINE  Office Visit Note  Patient Name: Kristen Francis  [id number]  [id number]  Date of Service: 07/17/2023  Chief Complaint  Patient presents with   Hypertension   Hyperlipidemia   Follow-up    Eval new med.     HPI Ziann presents for a follow-up visit for hypertension, dialysis, ESRD, poor appetite, nausea, unexplained weight loss.  Hypertension-- slightly improved.  Dialysis -- goes to hemodialysis 3 times weekly.  Lack of appetite and nausea-- prescribed dronabinol-- PA was denied by insurance, sent appeal to insurance on 05/30/23. but there is no documentation of a response and the patient states that she has not heard from the insurance about the appeal yet either. Lost 1 lbs since her last office visit.  Soreness/pain in spine -- requesting xray   Current Medication: Outpatient Encounter Medications as of 07/17/2023  Medication Sig   amLODipine (NORVASC) 10 MG tablet TAKE 1 TABLET(10 MG) BY MOUTH DAILY   aspirin EC 81 MG EC tablet Take 1 tablet (81 mg total) by mouth daily. Swallow whole.   B Complex-C-Folic Acid (RENAL-VITE) 0.8 MG TABS Take 0.8 mg by mouth every evening. On dialysis days   benzonatate (TESSALON) 100 MG capsule Take 1 capsule (100 mg total) by mouth 2 (two) times daily as needed for cough.   carvedilol (COREG) 25 MG tablet TAKE 1 TABLET(25 MG) BY MOUTH TWICE DAILY WITH A MEAL   cholecalciferol (VITAMIN D) 1000 units tablet Take 1,000 Units by mouth daily.    cinacalcet (SENSIPAR) 60 MG tablet Take 60 mg by mouth every evening.   dronabinol (MARINOL) 5 MG capsule Take 1 capsule by mouth before meals twice daily on non-dialysis days   ferrous sulfate 325 (65 FE) MG tablet Take 1 tablet (325 mg total) by mouth 2 (two) times daily with a meal.   HYDROcodone-acetaminophen (NORCO/VICODIN) 5-325 MG tablet Take 1 tablet by mouth every 4 (four) hours as needed for moderate pain.    lidocaine-prilocaine (EMLA) cream APPLY SMALL AMOUNT TO ACCESS SITE (AVF) 3 TIMES A WEEK 1 HOUR BEFORE DIALYSIS. COVER WITH OCCLUSIVE DRESSING (SARAN WRAP)   losartan (COZAAR) 50 MG tablet Take 1 tablet (50 mg total) by mouth daily.   metoCLOPramide (REGLAN) 5 MG tablet Take 1 tablet (5 mg total) by mouth 4 (four) times daily -  before meals and at bedtime.   omeprazole (PRILOSEC) 40 MG capsule TAKE 1 CAPSULE(40 MG) BY MOUTH DAILY   predniSONE (DELTASONE) 50 MG tablet Take 1 tablet (50 mg total) by mouth daily with breakfast.   rosuvastatin (CRESTOR) 10 MG tablet Take 1 tablet by mouth on non-dialysis days.   sennosides-docusate sodium (SENOKOT-S) 8.6-50 MG tablet Take 2 tablets by mouth daily.   sevelamer carbonate (RENVELA) 800 MG tablet Take 1,600 mg by mouth 3 (three) times daily.   VELPHORO 500 MG chewable tablet Chew 500 mg by mouth 3 (three) times daily.   furosemide (LASIX) 80 MG tablet Take 1 tablet (80 mg total) by mouth 4 (four) times a week. On non-dialysis days   hydrALAZINE (APRESOLINE) 50 MG tablet Take 1 tablet (50 mg total) by mouth 3 (three) times daily. Increased from 25 mg.   [DISCONTINUED] mirtazapine (REMERON) 7.5 MG tablet Take 1 tablet (7.5 mg total) by mouth at bedtime. (Patient not taking: No sig reported)   [DISCONTINUED] promethazine (PHENERGAN) 12.5 MG tablet Take 1 tablet (12.5 mg total) by mouth every 8 (eight) hours as  needed for nausea or vomiting. (Patient not taking: No sig reported)   No facility-administered encounter medications on file as of 07/17/2023.    Surgical History: Past Surgical History:  Procedure Laterality Date   A/V FISTULAGRAM Left 12/25/2018   Procedure: A/V FISTULAGRAM;  Surgeon: Renford Dills, MD;  Location: [facility] INVASIVE CV LAB;  Service: Cardiovascular;  Laterality: Left;   ABDOMINAL HYSTERECTOMY     APPENDECTOMY     AV FISTULA PLACEMENT Left 07/12/2018   Procedure: INSERTION OF ARTERIOVENOUS (AV) GORE-TEX GRAFT ARM ( BRACHIAL  AXILLARY );  Surgeon: Renford Dills, MD;  Location: [facility] ORS;  Service: Vascular;  Laterality: Left;   COLONOSCOPY WITH PROPOFOL N/A 01/14/2016   Procedure: COLONOSCOPY WITH PROPOFOL;  Surgeon: Scot Jun, MD;  Location: [facility] ENDOSCOPY;  Service: Endoscopy;  Laterality: N/A;   COLONOSCOPY WITH PROPOFOL N/A 02/14/2022   Procedure: COLONOSCOPY WITH PROPOFOL;  Surgeon: Regis Bill, MD;  Location: [facility] ENDOSCOPY;  Service: Endoscopy;  Laterality: N/A;   TONSILLECTOMY      Medical History: Past Medical History:  Diagnosis Date   Anemia    Arthritis    Chronic kidney disease    ESRD   Complication of anesthesia    had procedure and felt incision early 80s   Gout    Hyperlipemia    Hypertension    Macular degeneration, right eye    Stroke (HCC)     Family History: Family History  Problem Relation Age of Onset   Kidney disease Mother    Breast cancer Neg Hx     Social History   Socioeconomic History   Marital status: Married    Spouse name: Not on file   Number of children: Not on file   Years of education: Not on file   Highest education level: Not on file  Occupational History   Not on file  Tobacco Use   Smoking status: Former   Smokeless tobacco: Never  Vaping Use   Vaping status: Never Used  Substance and Sexual Activity   Alcohol use: No   Drug use: No   Sexual activity: Not on file  Other Topics Concern   Not on file  Social History Narrative   Lives in  [place]; used to clean; no smoking; no alcohol.    Social Determinants of Health   Financial Resource Strain: Not on file  Food Insecurity: Not on file  Transportation Needs: Not on file  Physical Activity: Not on file  Stress: Not on file  Social Connections: Not on file  Intimate Partner Violence: Not on file      Review of Systems  Constitutional:  Positive for appetite change and fatigue. Negative for chills and unexpected weight change.  HENT:  Negative for congestion,  postnasal drip, rhinorrhea, sneezing and sore throat.   Eyes:  Negative for redness.  Respiratory:  Negative for cough, chest tightness and shortness of breath.   Cardiovascular:  Negative for chest pain and palpitations.  Gastrointestinal:  Positive for abdominal pain and nausea. Negative for constipation, diarrhea and vomiting.  Genitourinary:  Negative for dysuria and frequency.  Musculoskeletal:  Negative for arthralgias, back pain, joint swelling and neck pain.  Skin:  Negative for rash.  Neurological: Negative.  Negative for tremors and numbness.  Hematological:  Negative for adenopathy. Does not bruise/bleed easily.  Psychiatric/Behavioral:  Negative for behavioral problems (Depression), sleep disturbance and suicidal ideas. The patient is not nervous/anxious.     Vital Signs: BP 138/60 Comment: 150/80  Pulse 89   Temp 98.2 F (36.8 C)   Resp 16   Ht 5\' 3"  (1.6 m)   Wt 115 lb 6.4 oz (52.3 kg)   SpO2 95%   BMI 20.44 kg/m    Physical Exam Vitals reviewed.  Constitutional:      General: She is not in acute distress.    Appearance: Normal appearance. She is normal weight. She is not ill-appearing.  HENT:     Head: Normocephalic and atraumatic.  Eyes:     Pupils: Pupils are equal, round, and reactive to light.  Cardiovascular:     Rate and Rhythm: Normal rate and regular rhythm.  Pulmonary:     Effort: Pulmonary effort is normal. No respiratory distress.  Neurological:     Mental Status: She is alert and oriented to person, place, and time.  Psychiatric:        Mood and Affect: Mood normal.        Behavior: Behavior normal.        Assessment/Plan: 1. Poor appetite Dronabinol prescription will be appealed, will try to appeal the denial by her insurance.   2. Unintentional weight loss Dronabinol prescription will be appealed  3. ESRD (end stage renal disease) on dialysis [facility]) Continue hemodialysis 3 times weekly as directed by her nephrologist.   4.  Essential hypertension BP is ok today, continue medications as prescribed.   5. Acute midline low back pain without sciatica Xray ordered per patient's request - DG Lumbar Spine Complete; Future    General Counseling: Aviya verbalizes understanding of the findings of todays visit and agrees with plan of treatment. I have discussed any further diagnostic evaluation that may be needed or ordered today. We also reviewed her medications today. she has been encouraged to call the office with any questions or concerns that should arise related to todays visit.    Orders Placed This Encounter  Procedures   DG Lumbar Spine Complete    No orders of the defined types were placed in this encounter.   Return in about 3 months (around 10/17/2023) for F/U, Cabell Lazenby PCP.   Total time spent:30 Minutes Time spent includes review of chart, medications, test results, and follow up plan with the patient.   [place] Controlled Substance Database was reviewed by me.  This patient was seen by Sallyanne Kuster, FNP-C in collaboration with Dr. Beverely Risen as a part of collaborative care agreement.   Quadre Bristol R. Tedd Sias, MSN, FNP-C Internal medicine

## 2023-07-17 NOTE — Telephone Encounter (Signed)
Re-faxed appeal request for patient's Dronabinol.

## 2023-07-18 ENCOUNTER — Telehealth: Payer: Self-pay | Admitting: Nurse Practitioner

## 2023-07-18 DIAGNOSIS — N186 End stage renal disease: Secondary | ICD-10-CM | POA: Diagnosis not present

## 2023-07-18 DIAGNOSIS — D689 Coagulation defect, unspecified: Secondary | ICD-10-CM | POA: Diagnosis not present

## 2023-07-18 DIAGNOSIS — D631 Anemia in chronic kidney disease: Secondary | ICD-10-CM | POA: Diagnosis not present

## 2023-07-18 DIAGNOSIS — Z992 Dependence on renal dialysis: Secondary | ICD-10-CM | POA: Diagnosis not present

## 2023-07-18 DIAGNOSIS — N2581 Secondary hyperparathyroidism of renal origin: Secondary | ICD-10-CM | POA: Diagnosis not present

## 2023-07-20 DIAGNOSIS — Z992 Dependence on renal dialysis: Secondary | ICD-10-CM | POA: Diagnosis not present

## 2023-07-20 DIAGNOSIS — N2581 Secondary hyperparathyroidism of renal origin: Secondary | ICD-10-CM | POA: Diagnosis not present

## 2023-07-20 DIAGNOSIS — D689 Coagulation defect, unspecified: Secondary | ICD-10-CM | POA: Diagnosis not present

## 2023-07-20 DIAGNOSIS — D631 Anemia in chronic kidney disease: Secondary | ICD-10-CM | POA: Diagnosis not present

## 2023-07-20 DIAGNOSIS — N186 End stage renal disease: Secondary | ICD-10-CM | POA: Diagnosis not present

## 2023-07-23 DIAGNOSIS — Z992 Dependence on renal dialysis: Secondary | ICD-10-CM | POA: Diagnosis not present

## 2023-07-23 DIAGNOSIS — D689 Coagulation defect, unspecified: Secondary | ICD-10-CM | POA: Diagnosis not present

## 2023-07-23 DIAGNOSIS — N2581 Secondary hyperparathyroidism of renal origin: Secondary | ICD-10-CM | POA: Diagnosis not present

## 2023-07-23 DIAGNOSIS — D631 Anemia in chronic kidney disease: Secondary | ICD-10-CM | POA: Diagnosis not present

## 2023-07-23 DIAGNOSIS — N186 End stage renal disease: Secondary | ICD-10-CM | POA: Diagnosis not present

## 2023-07-24 ENCOUNTER — Other Ambulatory Visit: Payer: Medicare Other

## 2023-07-25 DIAGNOSIS — D631 Anemia in chronic kidney disease: Secondary | ICD-10-CM | POA: Diagnosis not present

## 2023-07-25 DIAGNOSIS — Z992 Dependence on renal dialysis: Secondary | ICD-10-CM | POA: Diagnosis not present

## 2023-07-25 DIAGNOSIS — D689 Coagulation defect, unspecified: Secondary | ICD-10-CM | POA: Diagnosis not present

## 2023-07-25 DIAGNOSIS — N2581 Secondary hyperparathyroidism of renal origin: Secondary | ICD-10-CM | POA: Diagnosis not present

## 2023-07-25 DIAGNOSIS — N186 End stage renal disease: Secondary | ICD-10-CM | POA: Diagnosis not present

## 2023-07-27 DIAGNOSIS — N186 End stage renal disease: Secondary | ICD-10-CM | POA: Diagnosis not present

## 2023-07-27 DIAGNOSIS — N2581 Secondary hyperparathyroidism of renal origin: Secondary | ICD-10-CM | POA: Diagnosis not present

## 2023-07-27 DIAGNOSIS — D689 Coagulation defect, unspecified: Secondary | ICD-10-CM | POA: Diagnosis not present

## 2023-07-27 DIAGNOSIS — D631 Anemia in chronic kidney disease: Secondary | ICD-10-CM | POA: Diagnosis not present

## 2023-07-27 DIAGNOSIS — Z992 Dependence on renal dialysis: Secondary | ICD-10-CM | POA: Diagnosis not present

## 2023-07-30 DIAGNOSIS — D689 Coagulation defect, unspecified: Secondary | ICD-10-CM | POA: Diagnosis not present

## 2023-07-30 DIAGNOSIS — N186 End stage renal disease: Secondary | ICD-10-CM | POA: Diagnosis not present

## 2023-07-30 DIAGNOSIS — D631 Anemia in chronic kidney disease: Secondary | ICD-10-CM | POA: Diagnosis not present

## 2023-07-30 DIAGNOSIS — N2581 Secondary hyperparathyroidism of renal origin: Secondary | ICD-10-CM | POA: Diagnosis not present

## 2023-07-30 DIAGNOSIS — Z992 Dependence on renal dialysis: Secondary | ICD-10-CM | POA: Diagnosis not present

## 2023-08-01 DIAGNOSIS — D689 Coagulation defect, unspecified: Secondary | ICD-10-CM | POA: Diagnosis not present

## 2023-08-01 DIAGNOSIS — D631 Anemia in chronic kidney disease: Secondary | ICD-10-CM | POA: Diagnosis not present

## 2023-08-01 DIAGNOSIS — N186 End stage renal disease: Secondary | ICD-10-CM | POA: Diagnosis not present

## 2023-08-01 DIAGNOSIS — Z992 Dependence on renal dialysis: Secondary | ICD-10-CM | POA: Diagnosis not present

## 2023-08-01 DIAGNOSIS — N2581 Secondary hyperparathyroidism of renal origin: Secondary | ICD-10-CM | POA: Diagnosis not present

## 2023-08-02 DIAGNOSIS — N2581 Secondary hyperparathyroidism of renal origin: Secondary | ICD-10-CM | POA: Diagnosis not present

## 2023-08-02 DIAGNOSIS — D689 Coagulation defect, unspecified: Secondary | ICD-10-CM | POA: Diagnosis not present

## 2023-08-02 DIAGNOSIS — Z992 Dependence on renal dialysis: Secondary | ICD-10-CM | POA: Diagnosis not present

## 2023-08-02 DIAGNOSIS — N186 End stage renal disease: Secondary | ICD-10-CM | POA: Diagnosis not present

## 2023-08-02 DIAGNOSIS — D631 Anemia in chronic kidney disease: Secondary | ICD-10-CM | POA: Diagnosis not present

## 2023-08-03 ENCOUNTER — Emergency Department
Admission: EM | Admit: 2023-08-03 | Discharge: 2023-08-03 | Discharge status: Against Medical Advice | Payer: Medicare Other | Attending: Emergency Medicine | Admitting: Emergency Medicine

## 2023-08-03 ENCOUNTER — Other Ambulatory Visit: Payer: Self-pay

## 2023-08-03 ENCOUNTER — Telehealth: Payer: Self-pay | Admitting: Emergency Medicine

## 2023-08-03 ENCOUNTER — Ambulatory Visit: Payer: Medicare Other | Admitting: Nurse Practitioner

## 2023-08-03 ENCOUNTER — Emergency Department: Payer: Medicare Other

## 2023-08-03 ENCOUNTER — Encounter: Payer: Self-pay | Admitting: Nurse Practitioner

## 2023-08-03 VITALS — BP 185/50 | HR 74 | Temp 98.2°F | Resp 16 | Ht 63.0 in | Wt 116.6 lb

## 2023-08-03 DIAGNOSIS — I517 Cardiomegaly: Secondary | ICD-10-CM | POA: Diagnosis not present

## 2023-08-03 DIAGNOSIS — R5383 Other fatigue: Secondary | ICD-10-CM

## 2023-08-03 DIAGNOSIS — N186 End stage renal disease: Secondary | ICD-10-CM

## 2023-08-03 DIAGNOSIS — Z5321 Procedure and treatment not carried out due to patient leaving prior to being seen by health care provider: Secondary | ICD-10-CM | POA: Diagnosis not present

## 2023-08-03 DIAGNOSIS — R634 Abnormal weight loss: Secondary | ICD-10-CM | POA: Diagnosis not present

## 2023-08-03 DIAGNOSIS — R7989 Other specified abnormal findings of blood chemistry: Secondary | ICD-10-CM

## 2023-08-03 DIAGNOSIS — I10 Essential (primary) hypertension: Secondary | ICD-10-CM | POA: Diagnosis not present

## 2023-08-03 DIAGNOSIS — I771 Stricture of artery: Secondary | ICD-10-CM | POA: Diagnosis not present

## 2023-08-03 DIAGNOSIS — R630 Anorexia: Secondary | ICD-10-CM

## 2023-08-03 DIAGNOSIS — D508 Other iron deficiency anemias: Secondary | ICD-10-CM | POA: Diagnosis not present

## 2023-08-03 DIAGNOSIS — Z992 Dependence on renal dialysis: Secondary | ICD-10-CM | POA: Diagnosis not present

## 2023-08-03 DIAGNOSIS — R531 Weakness: Secondary | ICD-10-CM | POA: Diagnosis not present

## 2023-08-03 LAB — COMPREHENSIVE METABOLIC PANEL
ALT: 10 U/L (ref 0–44)
AST: 14 U/L — ABNORMAL LOW (ref 15–41)
Albumin: 3.3 g/dL — ABNORMAL LOW (ref 3.5–5.0)
Alkaline Phosphatase: 38 U/L (ref 38–126)
Anion gap: 20 — ABNORMAL HIGH (ref 5–15)
BUN: 35 mg/dL — ABNORMAL HIGH (ref 8–23)
CO2: 29 mmol/L (ref 22–32)
Calcium: 10.5 mg/dL — ABNORMAL HIGH (ref 8.9–10.3)
Chloride: 94 mmol/L — ABNORMAL LOW (ref 98–111)
Creatinine, Ser: 6.52 mg/dL — ABNORMAL HIGH (ref 0.44–1.00)
GFR, Estimated: 6 mL/min — ABNORMAL LOW (ref >60)
Glucose, Bld: 94 mg/dL (ref 70–99)
Potassium: 4 mmol/L (ref 3.5–5.1)
Sodium: 143 mmol/L (ref 135–145)
Total Bilirubin: 0.5 mg/dL (ref 0.3–1.2)
Total Protein: 6.4 g/dL — ABNORMAL LOW (ref 6.5–8.1)

## 2023-08-03 LAB — CBC WITH DIFFERENTIAL/PLATELET
Abs Immature Granulocytes: 0.01 10*3/uL (ref 0.00–0.07)
Basophils Absolute: 0 10*3/uL (ref 0.0–0.1)
Basophils Relative: 1 %
Eosinophils Absolute: 0.1 10*3/uL (ref 0.0–0.5)
Eosinophils Relative: 2 %
HCT: 31.8 % — ABNORMAL LOW (ref 36.0–46.0)
Hemoglobin: 9.8 g/dL — ABNORMAL LOW (ref 12.0–15.0)
Immature Granulocytes: 0 %
Lymphocytes Relative: 21 %
Lymphs Abs: 0.9 10*3/uL (ref 0.7–4.0)
MCH: 27.4 pg (ref 26.0–34.0)
MCHC: 30.8 g/dL (ref 30.0–36.0)
MCV: 88.8 fL (ref 80.0–100.0)
Monocytes Absolute: 0.5 10*3/uL (ref 0.1–1.0)
Monocytes Relative: 11 %
Neutro Abs: 2.8 10*3/uL (ref 1.7–7.7)
Neutrophils Relative %: 65 %
Platelets: 217 10*3/uL (ref 150–400)
RBC: 3.58 MIL/uL — ABNORMAL LOW (ref 3.87–5.11)
RDW: 16.9 % — ABNORMAL HIGH (ref 11.5–15.5)
WBC: 4.2 10*3/uL (ref 4.0–10.5)
nRBC: 0 % (ref 0.0–0.2)

## 2023-08-03 LAB — TROPONIN I (HIGH SENSITIVITY): Troponin I (High Sensitivity): 37 ng/L — ABNORMAL HIGH (ref <18)

## 2023-08-03 NOTE — Telephone Encounter (Signed)
Called patient due to left emergency department before provider exam to inquire about condition and follow up plans. She says she is going to call [employer] [facility].  I told her to tell Dr. Welton Flakes that there are labs completed and they need to be reviewed.

## 2023-08-03 NOTE — Progress Notes (Signed)
[facility] [address] [place], [place] [address]  Internal MEDICINE  Office Visit Note  Patient Name: Kristen Francis  [id number]  [id number]  Date of Service: 08/03/2023  Chief Complaint  Patient presents with   Follow-up    Fatigue. Went to ED.      HPI Clydell presents for an acute sick visit for weakness and fatigue  Patient was parked in the parking lot of the dialysis center and never came inside. She had fallen asleep and was woken up by the dialysis staff. She did not get dialysis that day and was instructed to follow up with her PCP to see why she is so fatigued. She has continued to have nausea, poor appetite, fatigue and low energy. This is in part due to dialysis and the effects of this procedure on the body  She also has a baseline shortness of breath which may be multifactorial. Concern for heart failure esp due to increased fatigue and activity intolerance.       Current Medication:  Outpatient Encounter Medications as of 08/03/2023  Medication Sig   amLODipine (NORVASC) 10 MG tablet TAKE 1 TABLET(10 MG) BY MOUTH DAILY   aspirin EC 81 MG EC tablet Take 1 tablet (81 mg total) by mouth daily. Swallow whole.   B Complex-C-Folic Acid (RENAL-VITE) 0.8 MG TABS Take 0.8 mg by mouth every evening. On dialysis days   benzonatate (TESSALON) 100 MG capsule Take 1 capsule (100 mg total) by mouth 2 (two) times daily as needed for cough.   carvedilol (COREG) 25 MG tablet TAKE 1 TABLET(25 MG) BY MOUTH TWICE DAILY WITH A MEAL   cholecalciferol (VITAMIN D) 1000 units tablet Take 1,000 Units by mouth daily.    cinacalcet (SENSIPAR) 60 MG tablet Take 60 mg by mouth every evening.   dronabinol (MARINOL) 5 MG capsule Take 1 capsule by mouth before meals twice daily on non-dialysis days   ferrous sulfate 325 (65 FE) MG tablet Take 1 tablet (325 mg total) by mouth 2 (two) times daily with a meal.   HYDROcodone-acetaminophen (NORCO/VICODIN) 5-325 MG tablet Take 1 tablet  by mouth every 4 (four) hours as needed for moderate pain.   lidocaine-prilocaine (EMLA) cream APPLY SMALL AMOUNT TO ACCESS SITE (AVF) 3 TIMES A WEEK 1 HOUR BEFORE DIALYSIS. COVER WITH OCCLUSIVE DRESSING (SARAN WRAP)   losartan (COZAAR) 50 MG tablet Take 1 tablet (50 mg total) by mouth daily.   metoCLOPramide (REGLAN) 5 MG tablet Take 1 tablet (5 mg total) by mouth 4 (four) times daily -  before meals and at bedtime.   omeprazole (PRILOSEC) 40 MG capsule TAKE 1 CAPSULE(40 MG) BY MOUTH DAILY   predniSONE (DELTASONE) 50 MG tablet Take 1 tablet (50 mg total) by mouth daily with breakfast.   rosuvastatin (CRESTOR) 10 MG tablet Take 1 tablet by mouth on non-dialysis days.   sennosides-docusate sodium (SENOKOT-S) 8.6-50 MG tablet Take 2 tablets by mouth daily.   sevelamer carbonate (RENVELA) 800 MG tablet Take 1,600 mg by mouth 3 (three) times daily.   VELPHORO 500 MG chewable tablet Chew 500 mg by mouth 3 (three) times daily.   furosemide (LASIX) 80 MG tablet Take 1 tablet (80 mg total) by mouth 4 (four) times a week. On non-dialysis days   hydrALAZINE (APRESOLINE) 50 MG tablet Take 1 tablet (50 mg total) by mouth 3 (three) times daily. Increased from 25 mg.   [DISCONTINUED] mirtazapine (REMERON) 7.5 MG tablet Take 1 tablet (7.5 mg total) by mouth at  bedtime. (Patient not taking: No sig reported)   [DISCONTINUED] promethazine (PHENERGAN) 12.5 MG tablet Take 1 tablet (12.5 mg total) by mouth every 8 (eight) hours as needed for nausea or vomiting. (Patient not taking: No sig reported)   No facility-administered encounter medications on file as of 08/03/2023.      Medical History: Past Medical History:  Diagnosis Date   Anemia    Arthritis    Chronic kidney disease    ESRD   Complication of anesthesia    had procedure and felt incision early 80s   Gout    Hyperlipemia    Hypertension    Macular degeneration, right eye    Stroke (HCC)      Vital Signs: BP (!) 185/50 Comment: 190/86   Pulse 74   Temp 98.2 F (36.8 C)   Resp 16   Ht 5\' 3"  (1.6 m)   Wt 116 lb 9.6 oz (52.9 kg)   SpO2 99%   BMI 20.65 kg/m    Review of Systems  Constitutional:  Positive for activity change, appetite change and fatigue.  Respiratory:  Positive for shortness of breath. Negative for cough, chest tightness and wheezing.   Cardiovascular: Negative.  Negative for chest pain and palpitations.  Gastrointestinal:  Positive for nausea.  Neurological:  Positive for weakness.    Physical Exam Constitutional:      General: She is not in acute distress.    Appearance: Normal appearance. She is normal weight. She is ill-appearing.  HENT:     Head: Normocephalic and atraumatic.  Eyes:     Pupils: Pupils are equal, round, and reactive to light.  Cardiovascular:     Rate and Rhythm: Normal rate. Rhythm irregular.     Heart sounds: Murmur heard.  Pulmonary:     Effort: Pulmonary effort is normal. No respiratory distress.     Breath sounds: Normal breath sounds.  Neurological:     Mental Status: She is alert and oriented to person, place, and time.  Psychiatric:        Mood and Affect: Mood normal.        Behavior: Behavior normal.       Assessment/Plan: 1. Elevated troponin Echocardiogram and labs ordered  - ECHOCARDIOGRAM COMPLETE; Future - Pro b natriuretic peptide (BNP)[facility] [facility]) - Stat Troponin T - Iron, TIBC and Ferritin Panel - CRP High sensitivity - Troponin T  2. Unintentional weight loss Echo and labs ordered  - ECHOCARDIOGRAM COMPLETE; Future - Pro b natriuretic peptide (BNP)[facility] [facility]) - Stat Troponin T - Iron, TIBC and Ferritin Panel - CRP High sensitivity  3. ESRD (end stage renal disease) on dialysis (HCC) Echo and labs ordered  - ECHOCARDIOGRAM COMPLETE; Future - Pro b natriuretic peptide (BNP)[facility] [facility]) - Stat Troponin T - Iron, TIBC and Ferritin Panel - CRP High sensitivity  4.  Weakness Echo and labs ordered  - ECHOCARDIOGRAM COMPLETE; Future - Pro b natriuretic peptide (BNP)[facility] [facility]) - Stat Troponin T - Iron, TIBC and Ferritin Panel - CRP High sensitivity  5. Essential hypertension Additional labs and an echo ordered  - ECHOCARDIOGRAM COMPLETE; Future - Pro b natriuretic peptide (BNP)[facility] [facility]) - Stat Troponin T - Iron, TIBC and Ferritin Panel - CRP High sensitivity  6. Poor appetite Labs ordered   7. Other fatigue Echocardiogram ordered and additional labs for further evaluation  - ECHOCARDIOGRAM COMPLETE; Future - Pro b natriuretic peptide (BNP)[facility] [facility]) - Stat  Troponin T - Iron, TIBC and Ferritin Panel - CRP High sensitivity   General Counseling: Mischell verbalizes understanding of the findings of todays visit and agrees with plan of treatment. I have discussed any further diagnostic evaluation that may be needed or ordered today. We also reviewed her medications today. she has been encouraged to call the office with any questions or concerns that should arise related to todays visit.    Counseling:    Orders Placed This Encounter  Procedures   Pro b natriuretic peptide (BNP)[facility] [facility])   Stat Troponin T   Iron, TIBC and Ferritin Panel   CRP High sensitivity   ECHOCARDIOGRAM COMPLETE    No orders of the defined types were placed in this encounter.   Return for stat labs today and echo next week, f/u for echo, will call about labs today..  [place] Controlled Substance Database was reviewed by me for overdose risk score (ORS)  Time spent:30 Minutes Time spent with patient included reviewing progress notes, labs, imaging studies, and discussing plan for follow up.   This patient was seen by Sallyanne Kuster, FNP-C in collaboration with Dr. Beverely Risen as a part of collaborative care agreement.  Justn Quale R. Tedd Sias, MSN, FNP-C Internal  Medicine

## 2023-08-03 NOTE — ED Triage Notes (Signed)
Pt reports ongoing fatigue since admission for pneumonia 2 months ago. Pt reports was meant to have dialysis this AM but didn't go d/t feeling tired. Pt denies chest pain or pressure. Ambulatory to triage. Denies dizziness or vision change. Pt alert and oriented. Breathing unlabored.

## 2023-08-04 DIAGNOSIS — N186 End stage renal disease: Secondary | ICD-10-CM | POA: Diagnosis not present

## 2023-08-04 DIAGNOSIS — Z992 Dependence on renal dialysis: Secondary | ICD-10-CM | POA: Diagnosis not present

## 2023-08-04 DIAGNOSIS — N040 Nephrotic syndrome with minor glomerular abnormality: Secondary | ICD-10-CM | POA: Diagnosis not present

## 2023-08-06 ENCOUNTER — Encounter: Payer: Self-pay | Admitting: Nurse Practitioner

## 2023-08-06 DIAGNOSIS — D509 Iron deficiency anemia, unspecified: Secondary | ICD-10-CM | POA: Diagnosis not present

## 2023-08-06 DIAGNOSIS — R509 Fever, unspecified: Secondary | ICD-10-CM | POA: Diagnosis not present

## 2023-08-06 DIAGNOSIS — N186 End stage renal disease: Secondary | ICD-10-CM | POA: Diagnosis not present

## 2023-08-06 DIAGNOSIS — R519 Headache, unspecified: Secondary | ICD-10-CM | POA: Diagnosis not present

## 2023-08-06 DIAGNOSIS — Z992 Dependence on renal dialysis: Secondary | ICD-10-CM | POA: Diagnosis not present

## 2023-08-06 DIAGNOSIS — D631 Anemia in chronic kidney disease: Secondary | ICD-10-CM | POA: Diagnosis not present

## 2023-08-06 DIAGNOSIS — N2581 Secondary hyperparathyroidism of renal origin: Secondary | ICD-10-CM | POA: Diagnosis not present

## 2023-08-06 DIAGNOSIS — D689 Coagulation defect, unspecified: Secondary | ICD-10-CM | POA: Diagnosis not present

## 2023-08-06 DIAGNOSIS — R52 Pain, unspecified: Secondary | ICD-10-CM | POA: Diagnosis not present

## 2023-08-07 NOTE — Telephone Encounter (Signed)
Error

## 2023-08-08 DIAGNOSIS — D689 Coagulation defect, unspecified: Secondary | ICD-10-CM | POA: Diagnosis not present

## 2023-08-08 DIAGNOSIS — N186 End stage renal disease: Secondary | ICD-10-CM | POA: Diagnosis not present

## 2023-08-08 DIAGNOSIS — R509 Fever, unspecified: Secondary | ICD-10-CM | POA: Diagnosis not present

## 2023-08-08 DIAGNOSIS — R519 Headache, unspecified: Secondary | ICD-10-CM | POA: Diagnosis not present

## 2023-08-08 DIAGNOSIS — Z992 Dependence on renal dialysis: Secondary | ICD-10-CM | POA: Diagnosis not present

## 2023-08-08 DIAGNOSIS — R52 Pain, unspecified: Secondary | ICD-10-CM | POA: Diagnosis not present

## 2023-08-08 DIAGNOSIS — N2581 Secondary hyperparathyroidism of renal origin: Secondary | ICD-10-CM | POA: Diagnosis not present

## 2023-08-08 DIAGNOSIS — D509 Iron deficiency anemia, unspecified: Secondary | ICD-10-CM | POA: Diagnosis not present

## 2023-08-08 DIAGNOSIS — D631 Anemia in chronic kidney disease: Secondary | ICD-10-CM | POA: Diagnosis not present

## 2023-08-09 ENCOUNTER — Other Ambulatory Visit: Payer: Medicare Other

## 2023-08-09 ENCOUNTER — Other Ambulatory Visit: Payer: Self-pay | Admitting: Internal Medicine

## 2023-08-09 DIAGNOSIS — I10 Essential (primary) hypertension: Secondary | ICD-10-CM

## 2023-08-09 LAB — IRON,TIBC AND FERRITIN PANEL
Ferritin: 1943 ng/mL — ABNORMAL HIGH (ref 15–150)
Iron Saturation: 35 % (ref 15–55)
Iron: 81 ug/dL (ref 27–139)
Total Iron Binding Capacity: 233 ug/dL — ABNORMAL LOW (ref 250–450)
UIBC: 152 ug/dL (ref 118–369)

## 2023-08-09 LAB — HIGH SENSITIVITY CRP: CRP, High Sensitivity: 1.01 mg/L (ref 0.00–3.00)

## 2023-08-09 LAB — TROPONIN T

## 2023-08-09 LAB — PRO B NATRIURETIC PEPTIDE: NT-Pro BNP: 60100 pg/mL — ABNORMAL HIGH (ref 0–738)

## 2023-08-09 NOTE — Telephone Encounter (Signed)
Please review and send 

## 2023-08-10 DIAGNOSIS — N2581 Secondary hyperparathyroidism of renal origin: Secondary | ICD-10-CM | POA: Diagnosis not present

## 2023-08-10 DIAGNOSIS — D689 Coagulation defect, unspecified: Secondary | ICD-10-CM | POA: Diagnosis not present

## 2023-08-10 DIAGNOSIS — R509 Fever, unspecified: Secondary | ICD-10-CM | POA: Diagnosis not present

## 2023-08-10 DIAGNOSIS — D509 Iron deficiency anemia, unspecified: Secondary | ICD-10-CM | POA: Diagnosis not present

## 2023-08-10 DIAGNOSIS — Z992 Dependence on renal dialysis: Secondary | ICD-10-CM | POA: Diagnosis not present

## 2023-08-10 DIAGNOSIS — R519 Headache, unspecified: Secondary | ICD-10-CM | POA: Diagnosis not present

## 2023-08-10 DIAGNOSIS — N186 End stage renal disease: Secondary | ICD-10-CM | POA: Diagnosis not present

## 2023-08-10 DIAGNOSIS — R52 Pain, unspecified: Secondary | ICD-10-CM | POA: Diagnosis not present

## 2023-08-10 DIAGNOSIS — D631 Anemia in chronic kidney disease: Secondary | ICD-10-CM | POA: Diagnosis not present

## 2023-08-12 ENCOUNTER — Other Ambulatory Visit: Payer: Self-pay | Admitting: Nurse Practitioner

## 2023-08-12 DIAGNOSIS — I10 Essential (primary) hypertension: Secondary | ICD-10-CM

## 2023-08-13 DIAGNOSIS — R509 Fever, unspecified: Secondary | ICD-10-CM | POA: Diagnosis not present

## 2023-08-13 DIAGNOSIS — D631 Anemia in chronic kidney disease: Secondary | ICD-10-CM | POA: Diagnosis not present

## 2023-08-13 DIAGNOSIS — N186 End stage renal disease: Secondary | ICD-10-CM | POA: Diagnosis not present

## 2023-08-13 DIAGNOSIS — D689 Coagulation defect, unspecified: Secondary | ICD-10-CM | POA: Diagnosis not present

## 2023-08-13 DIAGNOSIS — N2581 Secondary hyperparathyroidism of renal origin: Secondary | ICD-10-CM | POA: Diagnosis not present

## 2023-08-13 DIAGNOSIS — R519 Headache, unspecified: Secondary | ICD-10-CM | POA: Diagnosis not present

## 2023-08-13 DIAGNOSIS — Z992 Dependence on renal dialysis: Secondary | ICD-10-CM | POA: Diagnosis not present

## 2023-08-13 DIAGNOSIS — R52 Pain, unspecified: Secondary | ICD-10-CM | POA: Diagnosis not present

## 2023-08-13 DIAGNOSIS — D509 Iron deficiency anemia, unspecified: Secondary | ICD-10-CM | POA: Diagnosis not present

## 2023-08-14 ENCOUNTER — Ambulatory Visit: Payer: Medicare Other | Admitting: Nurse Practitioner

## 2023-08-15 DIAGNOSIS — D509 Iron deficiency anemia, unspecified: Secondary | ICD-10-CM | POA: Diagnosis not present

## 2023-08-15 DIAGNOSIS — R52 Pain, unspecified: Secondary | ICD-10-CM | POA: Diagnosis not present

## 2023-08-15 DIAGNOSIS — R519 Headache, unspecified: Secondary | ICD-10-CM | POA: Diagnosis not present

## 2023-08-15 DIAGNOSIS — N2581 Secondary hyperparathyroidism of renal origin: Secondary | ICD-10-CM | POA: Diagnosis not present

## 2023-08-15 DIAGNOSIS — N186 End stage renal disease: Secondary | ICD-10-CM | POA: Diagnosis not present

## 2023-08-15 DIAGNOSIS — D631 Anemia in chronic kidney disease: Secondary | ICD-10-CM | POA: Diagnosis not present

## 2023-08-15 DIAGNOSIS — Z992 Dependence on renal dialysis: Secondary | ICD-10-CM | POA: Diagnosis not present

## 2023-08-15 DIAGNOSIS — R509 Fever, unspecified: Secondary | ICD-10-CM | POA: Diagnosis not present

## 2023-08-15 DIAGNOSIS — D689 Coagulation defect, unspecified: Secondary | ICD-10-CM | POA: Diagnosis not present

## 2023-08-16 DIAGNOSIS — R52 Pain, unspecified: Secondary | ICD-10-CM | POA: Diagnosis not present

## 2023-08-16 DIAGNOSIS — Z992 Dependence on renal dialysis: Secondary | ICD-10-CM | POA: Diagnosis not present

## 2023-08-16 DIAGNOSIS — D689 Coagulation defect, unspecified: Secondary | ICD-10-CM | POA: Diagnosis not present

## 2023-08-16 DIAGNOSIS — D509 Iron deficiency anemia, unspecified: Secondary | ICD-10-CM | POA: Diagnosis not present

## 2023-08-16 DIAGNOSIS — R519 Headache, unspecified: Secondary | ICD-10-CM | POA: Diagnosis not present

## 2023-08-16 DIAGNOSIS — R509 Fever, unspecified: Secondary | ICD-10-CM | POA: Diagnosis not present

## 2023-08-16 DIAGNOSIS — D631 Anemia in chronic kidney disease: Secondary | ICD-10-CM | POA: Diagnosis not present

## 2023-08-16 DIAGNOSIS — N186 End stage renal disease: Secondary | ICD-10-CM | POA: Diagnosis not present

## 2023-08-16 DIAGNOSIS — N2581 Secondary hyperparathyroidism of renal origin: Secondary | ICD-10-CM | POA: Diagnosis not present

## 2023-08-17 DIAGNOSIS — R519 Headache, unspecified: Secondary | ICD-10-CM | POA: Diagnosis not present

## 2023-08-17 DIAGNOSIS — Z992 Dependence on renal dialysis: Secondary | ICD-10-CM | POA: Diagnosis not present

## 2023-08-17 DIAGNOSIS — R509 Fever, unspecified: Secondary | ICD-10-CM | POA: Diagnosis not present

## 2023-08-17 DIAGNOSIS — N2581 Secondary hyperparathyroidism of renal origin: Secondary | ICD-10-CM | POA: Diagnosis not present

## 2023-08-17 DIAGNOSIS — R52 Pain, unspecified: Secondary | ICD-10-CM | POA: Diagnosis not present

## 2023-08-17 DIAGNOSIS — D509 Iron deficiency anemia, unspecified: Secondary | ICD-10-CM | POA: Diagnosis not present

## 2023-08-17 DIAGNOSIS — N186 End stage renal disease: Secondary | ICD-10-CM | POA: Diagnosis not present

## 2023-08-17 DIAGNOSIS — D689 Coagulation defect, unspecified: Secondary | ICD-10-CM | POA: Diagnosis not present

## 2023-08-17 DIAGNOSIS — D631 Anemia in chronic kidney disease: Secondary | ICD-10-CM | POA: Diagnosis not present

## 2023-08-20 DIAGNOSIS — R519 Headache, unspecified: Secondary | ICD-10-CM | POA: Diagnosis not present

## 2023-08-20 DIAGNOSIS — D689 Coagulation defect, unspecified: Secondary | ICD-10-CM | POA: Diagnosis not present

## 2023-08-20 DIAGNOSIS — R52 Pain, unspecified: Secondary | ICD-10-CM | POA: Diagnosis not present

## 2023-08-20 DIAGNOSIS — D631 Anemia in chronic kidney disease: Secondary | ICD-10-CM | POA: Diagnosis not present

## 2023-08-20 DIAGNOSIS — Z992 Dependence on renal dialysis: Secondary | ICD-10-CM | POA: Diagnosis not present

## 2023-08-20 DIAGNOSIS — R509 Fever, unspecified: Secondary | ICD-10-CM | POA: Diagnosis not present

## 2023-08-20 DIAGNOSIS — N2581 Secondary hyperparathyroidism of renal origin: Secondary | ICD-10-CM | POA: Diagnosis not present

## 2023-08-20 DIAGNOSIS — D509 Iron deficiency anemia, unspecified: Secondary | ICD-10-CM | POA: Diagnosis not present

## 2023-08-20 DIAGNOSIS — N186 End stage renal disease: Secondary | ICD-10-CM | POA: Diagnosis not present

## 2023-08-22 DIAGNOSIS — N186 End stage renal disease: Secondary | ICD-10-CM | POA: Diagnosis not present

## 2023-08-22 DIAGNOSIS — D631 Anemia in chronic kidney disease: Secondary | ICD-10-CM | POA: Diagnosis not present

## 2023-08-22 DIAGNOSIS — N2581 Secondary hyperparathyroidism of renal origin: Secondary | ICD-10-CM | POA: Diagnosis not present

## 2023-08-22 DIAGNOSIS — R52 Pain, unspecified: Secondary | ICD-10-CM | POA: Diagnosis not present

## 2023-08-22 DIAGNOSIS — D509 Iron deficiency anemia, unspecified: Secondary | ICD-10-CM | POA: Diagnosis not present

## 2023-08-22 DIAGNOSIS — D689 Coagulation defect, unspecified: Secondary | ICD-10-CM | POA: Diagnosis not present

## 2023-08-22 DIAGNOSIS — R519 Headache, unspecified: Secondary | ICD-10-CM | POA: Diagnosis not present

## 2023-08-22 DIAGNOSIS — Z992 Dependence on renal dialysis: Secondary | ICD-10-CM | POA: Diagnosis not present

## 2023-08-22 DIAGNOSIS — R509 Fever, unspecified: Secondary | ICD-10-CM | POA: Diagnosis not present

## 2023-08-24 DIAGNOSIS — R509 Fever, unspecified: Secondary | ICD-10-CM | POA: Diagnosis not present

## 2023-08-24 DIAGNOSIS — R519 Headache, unspecified: Secondary | ICD-10-CM | POA: Diagnosis not present

## 2023-08-24 DIAGNOSIS — D631 Anemia in chronic kidney disease: Secondary | ICD-10-CM | POA: Diagnosis not present

## 2023-08-24 DIAGNOSIS — Z992 Dependence on renal dialysis: Secondary | ICD-10-CM | POA: Diagnosis not present

## 2023-08-24 DIAGNOSIS — R52 Pain, unspecified: Secondary | ICD-10-CM | POA: Diagnosis not present

## 2023-08-24 DIAGNOSIS — D689 Coagulation defect, unspecified: Secondary | ICD-10-CM | POA: Diagnosis not present

## 2023-08-24 DIAGNOSIS — N186 End stage renal disease: Secondary | ICD-10-CM | POA: Diagnosis not present

## 2023-08-24 DIAGNOSIS — N2581 Secondary hyperparathyroidism of renal origin: Secondary | ICD-10-CM | POA: Diagnosis not present

## 2023-08-24 DIAGNOSIS — D509 Iron deficiency anemia, unspecified: Secondary | ICD-10-CM | POA: Diagnosis not present

## 2023-08-27 DIAGNOSIS — D631 Anemia in chronic kidney disease: Secondary | ICD-10-CM | POA: Diagnosis not present

## 2023-08-27 DIAGNOSIS — R519 Headache, unspecified: Secondary | ICD-10-CM | POA: Diagnosis not present

## 2023-08-27 DIAGNOSIS — R52 Pain, unspecified: Secondary | ICD-10-CM | POA: Diagnosis not present

## 2023-08-27 DIAGNOSIS — D689 Coagulation defect, unspecified: Secondary | ICD-10-CM | POA: Diagnosis not present

## 2023-08-27 DIAGNOSIS — N2581 Secondary hyperparathyroidism of renal origin: Secondary | ICD-10-CM | POA: Diagnosis not present

## 2023-08-27 DIAGNOSIS — R509 Fever, unspecified: Secondary | ICD-10-CM | POA: Diagnosis not present

## 2023-08-27 DIAGNOSIS — N186 End stage renal disease: Secondary | ICD-10-CM | POA: Diagnosis not present

## 2023-08-27 DIAGNOSIS — Z992 Dependence on renal dialysis: Secondary | ICD-10-CM | POA: Diagnosis not present

## 2023-08-27 DIAGNOSIS — D509 Iron deficiency anemia, unspecified: Secondary | ICD-10-CM | POA: Diagnosis not present

## 2023-08-28 ENCOUNTER — Ambulatory Visit: Admission: RE | Admit: 2023-08-28 | Payer: Medicare Other | Source: Ambulatory Visit

## 2023-08-29 DIAGNOSIS — N186 End stage renal disease: Secondary | ICD-10-CM | POA: Diagnosis not present

## 2023-08-29 DIAGNOSIS — D689 Coagulation defect, unspecified: Secondary | ICD-10-CM | POA: Diagnosis not present

## 2023-08-29 DIAGNOSIS — R519 Headache, unspecified: Secondary | ICD-10-CM | POA: Diagnosis not present

## 2023-08-29 DIAGNOSIS — R52 Pain, unspecified: Secondary | ICD-10-CM | POA: Diagnosis not present

## 2023-08-29 DIAGNOSIS — Z992 Dependence on renal dialysis: Secondary | ICD-10-CM | POA: Diagnosis not present

## 2023-08-29 DIAGNOSIS — D509 Iron deficiency anemia, unspecified: Secondary | ICD-10-CM | POA: Diagnosis not present

## 2023-08-29 DIAGNOSIS — R509 Fever, unspecified: Secondary | ICD-10-CM | POA: Diagnosis not present

## 2023-08-29 DIAGNOSIS — D631 Anemia in chronic kidney disease: Secondary | ICD-10-CM | POA: Diagnosis not present

## 2023-08-29 DIAGNOSIS — N2581 Secondary hyperparathyroidism of renal origin: Secondary | ICD-10-CM | POA: Diagnosis not present

## 2023-08-31 DIAGNOSIS — D689 Coagulation defect, unspecified: Secondary | ICD-10-CM | POA: Diagnosis not present

## 2023-08-31 DIAGNOSIS — R52 Pain, unspecified: Secondary | ICD-10-CM | POA: Diagnosis not present

## 2023-08-31 DIAGNOSIS — Z992 Dependence on renal dialysis: Secondary | ICD-10-CM | POA: Diagnosis not present

## 2023-08-31 DIAGNOSIS — R509 Fever, unspecified: Secondary | ICD-10-CM | POA: Diagnosis not present

## 2023-08-31 DIAGNOSIS — N2581 Secondary hyperparathyroidism of renal origin: Secondary | ICD-10-CM | POA: Diagnosis not present

## 2023-08-31 DIAGNOSIS — D509 Iron deficiency anemia, unspecified: Secondary | ICD-10-CM | POA: Diagnosis not present

## 2023-08-31 DIAGNOSIS — R519 Headache, unspecified: Secondary | ICD-10-CM | POA: Diagnosis not present

## 2023-08-31 DIAGNOSIS — D631 Anemia in chronic kidney disease: Secondary | ICD-10-CM | POA: Diagnosis not present

## 2023-08-31 DIAGNOSIS — N186 End stage renal disease: Secondary | ICD-10-CM | POA: Diagnosis not present

## 2023-09-03 DIAGNOSIS — N2581 Secondary hyperparathyroidism of renal origin: Secondary | ICD-10-CM | POA: Diagnosis not present

## 2023-09-03 DIAGNOSIS — D689 Coagulation defect, unspecified: Secondary | ICD-10-CM | POA: Diagnosis not present

## 2023-09-03 DIAGNOSIS — R519 Headache, unspecified: Secondary | ICD-10-CM | POA: Diagnosis not present

## 2023-09-03 DIAGNOSIS — D631 Anemia in chronic kidney disease: Secondary | ICD-10-CM | POA: Diagnosis not present

## 2023-09-03 DIAGNOSIS — N040 Nephrotic syndrome with minor glomerular abnormality: Secondary | ICD-10-CM | POA: Diagnosis not present

## 2023-09-03 DIAGNOSIS — D509 Iron deficiency anemia, unspecified: Secondary | ICD-10-CM | POA: Diagnosis not present

## 2023-09-03 DIAGNOSIS — Z992 Dependence on renal dialysis: Secondary | ICD-10-CM | POA: Diagnosis not present

## 2023-09-03 DIAGNOSIS — N186 End stage renal disease: Secondary | ICD-10-CM | POA: Diagnosis not present

## 2023-09-03 DIAGNOSIS — R509 Fever, unspecified: Secondary | ICD-10-CM | POA: Diagnosis not present

## 2023-09-03 DIAGNOSIS — R52 Pain, unspecified: Secondary | ICD-10-CM | POA: Diagnosis not present

## 2023-09-05 DIAGNOSIS — R52 Pain, unspecified: Secondary | ICD-10-CM | POA: Diagnosis not present

## 2023-09-05 DIAGNOSIS — R519 Headache, unspecified: Secondary | ICD-10-CM | POA: Diagnosis not present

## 2023-09-05 DIAGNOSIS — N186 End stage renal disease: Secondary | ICD-10-CM | POA: Diagnosis not present

## 2023-09-05 DIAGNOSIS — D631 Anemia in chronic kidney disease: Secondary | ICD-10-CM | POA: Diagnosis not present

## 2023-09-05 DIAGNOSIS — D689 Coagulation defect, unspecified: Secondary | ICD-10-CM | POA: Diagnosis not present

## 2023-09-05 DIAGNOSIS — Z992 Dependence on renal dialysis: Secondary | ICD-10-CM | POA: Diagnosis not present

## 2023-09-05 DIAGNOSIS — N2581 Secondary hyperparathyroidism of renal origin: Secondary | ICD-10-CM | POA: Diagnosis not present

## 2023-09-07 DIAGNOSIS — R519 Headache, unspecified: Secondary | ICD-10-CM | POA: Diagnosis not present

## 2023-09-07 DIAGNOSIS — R52 Pain, unspecified: Secondary | ICD-10-CM | POA: Diagnosis not present

## 2023-09-07 DIAGNOSIS — N2581 Secondary hyperparathyroidism of renal origin: Secondary | ICD-10-CM | POA: Diagnosis not present

## 2023-09-07 DIAGNOSIS — N186 End stage renal disease: Secondary | ICD-10-CM | POA: Diagnosis not present

## 2023-09-07 DIAGNOSIS — D631 Anemia in chronic kidney disease: Secondary | ICD-10-CM | POA: Diagnosis not present

## 2023-09-07 DIAGNOSIS — Z992 Dependence on renal dialysis: Secondary | ICD-10-CM | POA: Diagnosis not present

## 2023-09-07 DIAGNOSIS — D689 Coagulation defect, unspecified: Secondary | ICD-10-CM | POA: Diagnosis not present

## 2023-09-08 DIAGNOSIS — N186 End stage renal disease: Secondary | ICD-10-CM | POA: Diagnosis not present

## 2023-09-08 DIAGNOSIS — N2581 Secondary hyperparathyroidism of renal origin: Secondary | ICD-10-CM | POA: Diagnosis not present

## 2023-09-08 DIAGNOSIS — R52 Pain, unspecified: Secondary | ICD-10-CM | POA: Diagnosis not present

## 2023-09-08 DIAGNOSIS — D689 Coagulation defect, unspecified: Secondary | ICD-10-CM | POA: Diagnosis not present

## 2023-09-08 DIAGNOSIS — Z992 Dependence on renal dialysis: Secondary | ICD-10-CM | POA: Diagnosis not present

## 2023-09-08 DIAGNOSIS — D631 Anemia in chronic kidney disease: Secondary | ICD-10-CM | POA: Diagnosis not present

## 2023-09-08 DIAGNOSIS — R519 Headache, unspecified: Secondary | ICD-10-CM | POA: Diagnosis not present

## 2023-09-10 DIAGNOSIS — N186 End stage renal disease: Secondary | ICD-10-CM | POA: Diagnosis not present

## 2023-09-10 DIAGNOSIS — R519 Headache, unspecified: Secondary | ICD-10-CM | POA: Diagnosis not present

## 2023-09-10 DIAGNOSIS — Z992 Dependence on renal dialysis: Secondary | ICD-10-CM | POA: Diagnosis not present

## 2023-09-10 DIAGNOSIS — D689 Coagulation defect, unspecified: Secondary | ICD-10-CM | POA: Diagnosis not present

## 2023-09-10 DIAGNOSIS — R52 Pain, unspecified: Secondary | ICD-10-CM | POA: Diagnosis not present

## 2023-09-10 DIAGNOSIS — D631 Anemia in chronic kidney disease: Secondary | ICD-10-CM | POA: Diagnosis not present

## 2023-09-10 DIAGNOSIS — N2581 Secondary hyperparathyroidism of renal origin: Secondary | ICD-10-CM | POA: Diagnosis not present

## 2023-09-12 DIAGNOSIS — R52 Pain, unspecified: Secondary | ICD-10-CM | POA: Diagnosis not present

## 2023-09-12 DIAGNOSIS — D631 Anemia in chronic kidney disease: Secondary | ICD-10-CM | POA: Diagnosis not present

## 2023-09-12 DIAGNOSIS — D689 Coagulation defect, unspecified: Secondary | ICD-10-CM | POA: Diagnosis not present

## 2023-09-12 DIAGNOSIS — N2581 Secondary hyperparathyroidism of renal origin: Secondary | ICD-10-CM | POA: Diagnosis not present

## 2023-09-12 DIAGNOSIS — Z992 Dependence on renal dialysis: Secondary | ICD-10-CM | POA: Diagnosis not present

## 2023-09-12 DIAGNOSIS — N186 End stage renal disease: Secondary | ICD-10-CM | POA: Diagnosis not present

## 2023-09-12 DIAGNOSIS — R519 Headache, unspecified: Secondary | ICD-10-CM | POA: Diagnosis not present

## 2023-09-14 ENCOUNTER — Encounter: Payer: Self-pay | Admitting: Nurse Practitioner

## 2023-09-14 DIAGNOSIS — N186 End stage renal disease: Secondary | ICD-10-CM | POA: Diagnosis not present

## 2023-09-14 DIAGNOSIS — N2581 Secondary hyperparathyroidism of renal origin: Secondary | ICD-10-CM | POA: Diagnosis not present

## 2023-09-14 DIAGNOSIS — R519 Headache, unspecified: Secondary | ICD-10-CM | POA: Diagnosis not present

## 2023-09-14 DIAGNOSIS — Z992 Dependence on renal dialysis: Secondary | ICD-10-CM | POA: Diagnosis not present

## 2023-09-14 DIAGNOSIS — D631 Anemia in chronic kidney disease: Secondary | ICD-10-CM | POA: Diagnosis not present

## 2023-09-14 DIAGNOSIS — D689 Coagulation defect, unspecified: Secondary | ICD-10-CM | POA: Diagnosis not present

## 2023-09-14 DIAGNOSIS — R52 Pain, unspecified: Secondary | ICD-10-CM | POA: Diagnosis not present

## 2023-09-17 DIAGNOSIS — N2581 Secondary hyperparathyroidism of renal origin: Secondary | ICD-10-CM | POA: Diagnosis not present

## 2023-09-17 DIAGNOSIS — D631 Anemia in chronic kidney disease: Secondary | ICD-10-CM | POA: Diagnosis not present

## 2023-09-17 DIAGNOSIS — Z992 Dependence on renal dialysis: Secondary | ICD-10-CM | POA: Diagnosis not present

## 2023-09-17 DIAGNOSIS — R519 Headache, unspecified: Secondary | ICD-10-CM | POA: Diagnosis not present

## 2023-09-17 DIAGNOSIS — R52 Pain, unspecified: Secondary | ICD-10-CM | POA: Diagnosis not present

## 2023-09-17 DIAGNOSIS — D689 Coagulation defect, unspecified: Secondary | ICD-10-CM | POA: Diagnosis not present

## 2023-09-17 DIAGNOSIS — N186 End stage renal disease: Secondary | ICD-10-CM | POA: Diagnosis not present

## 2023-09-19 ENCOUNTER — Other Ambulatory Visit (INDEPENDENT_AMBULATORY_CARE_PROVIDER_SITE_OTHER): Payer: Self-pay | Admitting: Nurse Practitioner

## 2023-09-19 DIAGNOSIS — N186 End stage renal disease: Secondary | ICD-10-CM

## 2023-09-19 DIAGNOSIS — R52 Pain, unspecified: Secondary | ICD-10-CM | POA: Diagnosis not present

## 2023-09-19 DIAGNOSIS — N2581 Secondary hyperparathyroidism of renal origin: Secondary | ICD-10-CM | POA: Diagnosis not present

## 2023-09-19 DIAGNOSIS — Z992 Dependence on renal dialysis: Secondary | ICD-10-CM | POA: Diagnosis not present

## 2023-09-19 DIAGNOSIS — D689 Coagulation defect, unspecified: Secondary | ICD-10-CM | POA: Diagnosis not present

## 2023-09-19 DIAGNOSIS — D631 Anemia in chronic kidney disease: Secondary | ICD-10-CM | POA: Diagnosis not present

## 2023-09-19 DIAGNOSIS — R519 Headache, unspecified: Secondary | ICD-10-CM | POA: Diagnosis not present

## 2023-09-21 ENCOUNTER — Ambulatory Visit (INDEPENDENT_AMBULATORY_CARE_PROVIDER_SITE_OTHER): Payer: Medicare Other

## 2023-09-21 DIAGNOSIS — N186 End stage renal disease: Secondary | ICD-10-CM | POA: Diagnosis not present

## 2023-09-21 DIAGNOSIS — R519 Headache, unspecified: Secondary | ICD-10-CM | POA: Diagnosis not present

## 2023-09-21 DIAGNOSIS — N2581 Secondary hyperparathyroidism of renal origin: Secondary | ICD-10-CM | POA: Diagnosis not present

## 2023-09-21 DIAGNOSIS — D689 Coagulation defect, unspecified: Secondary | ICD-10-CM | POA: Diagnosis not present

## 2023-09-21 DIAGNOSIS — D631 Anemia in chronic kidney disease: Secondary | ICD-10-CM | POA: Diagnosis not present

## 2023-09-21 DIAGNOSIS — Z992 Dependence on renal dialysis: Secondary | ICD-10-CM | POA: Diagnosis not present

## 2023-09-21 DIAGNOSIS — R52 Pain, unspecified: Secondary | ICD-10-CM | POA: Diagnosis not present

## 2023-09-24 DIAGNOSIS — Z992 Dependence on renal dialysis: Secondary | ICD-10-CM | POA: Diagnosis not present

## 2023-09-24 DIAGNOSIS — N2581 Secondary hyperparathyroidism of renal origin: Secondary | ICD-10-CM | POA: Diagnosis not present

## 2023-09-24 DIAGNOSIS — R52 Pain, unspecified: Secondary | ICD-10-CM | POA: Diagnosis not present

## 2023-09-24 DIAGNOSIS — R519 Headache, unspecified: Secondary | ICD-10-CM | POA: Diagnosis not present

## 2023-09-24 DIAGNOSIS — D631 Anemia in chronic kidney disease: Secondary | ICD-10-CM | POA: Diagnosis not present

## 2023-09-24 DIAGNOSIS — D689 Coagulation defect, unspecified: Secondary | ICD-10-CM | POA: Diagnosis not present

## 2023-09-24 DIAGNOSIS — N186 End stage renal disease: Secondary | ICD-10-CM | POA: Diagnosis not present

## 2023-09-26 DIAGNOSIS — D631 Anemia in chronic kidney disease: Secondary | ICD-10-CM | POA: Diagnosis not present

## 2023-09-26 DIAGNOSIS — R519 Headache, unspecified: Secondary | ICD-10-CM | POA: Diagnosis not present

## 2023-09-26 DIAGNOSIS — D689 Coagulation defect, unspecified: Secondary | ICD-10-CM | POA: Diagnosis not present

## 2023-09-26 DIAGNOSIS — N186 End stage renal disease: Secondary | ICD-10-CM | POA: Diagnosis not present

## 2023-09-26 DIAGNOSIS — N2581 Secondary hyperparathyroidism of renal origin: Secondary | ICD-10-CM | POA: Diagnosis not present

## 2023-09-26 DIAGNOSIS — R52 Pain, unspecified: Secondary | ICD-10-CM | POA: Diagnosis not present

## 2023-09-26 DIAGNOSIS — Z992 Dependence on renal dialysis: Secondary | ICD-10-CM | POA: Diagnosis not present

## 2023-09-28 DIAGNOSIS — D689 Coagulation defect, unspecified: Secondary | ICD-10-CM | POA: Diagnosis not present

## 2023-09-28 DIAGNOSIS — D631 Anemia in chronic kidney disease: Secondary | ICD-10-CM | POA: Diagnosis not present

## 2023-09-28 DIAGNOSIS — N186 End stage renal disease: Secondary | ICD-10-CM | POA: Diagnosis not present

## 2023-09-28 DIAGNOSIS — R52 Pain, unspecified: Secondary | ICD-10-CM | POA: Diagnosis not present

## 2023-09-28 DIAGNOSIS — R519 Headache, unspecified: Secondary | ICD-10-CM | POA: Diagnosis not present

## 2023-09-28 DIAGNOSIS — N2581 Secondary hyperparathyroidism of renal origin: Secondary | ICD-10-CM | POA: Diagnosis not present

## 2023-09-28 DIAGNOSIS — Z992 Dependence on renal dialysis: Secondary | ICD-10-CM | POA: Diagnosis not present

## 2023-10-01 ENCOUNTER — Telehealth: Payer: Self-pay | Admitting: Nurse Practitioner

## 2023-10-01 ENCOUNTER — Encounter (INDEPENDENT_AMBULATORY_CARE_PROVIDER_SITE_OTHER): Payer: Self-pay | Admitting: Vascular Surgery

## 2023-10-01 ENCOUNTER — Ambulatory Visit (INDEPENDENT_AMBULATORY_CARE_PROVIDER_SITE_OTHER): Payer: Medicare Other | Admitting: Vascular Surgery

## 2023-10-01 VITALS — BP 196/86 | HR 65 | Resp 18 | Ht 63.0 in | Wt 111.2 lb

## 2023-10-01 DIAGNOSIS — I739 Peripheral vascular disease, unspecified: Secondary | ICD-10-CM

## 2023-10-01 DIAGNOSIS — R52 Pain, unspecified: Secondary | ICD-10-CM | POA: Diagnosis not present

## 2023-10-01 DIAGNOSIS — I10 Essential (primary) hypertension: Secondary | ICD-10-CM

## 2023-10-01 DIAGNOSIS — D689 Coagulation defect, unspecified: Secondary | ICD-10-CM | POA: Diagnosis not present

## 2023-10-01 DIAGNOSIS — R519 Headache, unspecified: Secondary | ICD-10-CM | POA: Diagnosis not present

## 2023-10-01 DIAGNOSIS — I6522 Occlusion and stenosis of left carotid artery: Secondary | ICD-10-CM | POA: Diagnosis not present

## 2023-10-01 DIAGNOSIS — D631 Anemia in chronic kidney disease: Secondary | ICD-10-CM | POA: Diagnosis not present

## 2023-10-01 DIAGNOSIS — N186 End stage renal disease: Secondary | ICD-10-CM | POA: Diagnosis not present

## 2023-10-01 DIAGNOSIS — E1129 Type 2 diabetes mellitus with other diabetic kidney complication: Secondary | ICD-10-CM

## 2023-10-01 DIAGNOSIS — N2581 Secondary hyperparathyroidism of renal origin: Secondary | ICD-10-CM | POA: Diagnosis not present

## 2023-10-01 DIAGNOSIS — Z992 Dependence on renal dialysis: Secondary | ICD-10-CM | POA: Diagnosis not present

## 2023-10-01 NOTE — Progress Notes (Signed)
MRN : [id number]  Kristen Francis is a 77 y.o. (06/22/1946) female who presents with chief complaint of check access.  History of Present Illness:   The patient returns to the office for followup of their dialysis access.    The patient reports the function of the access has been stable. Patient denies difficulty with cannulation. The patient denies increased bleeding time after removing the needles. The patient denies hand pain or other symptoms consistent with steal phenomena.  No significant arm swelling.   The patient notes that it has been several years but nothing significant.  She notes that it is largely dependent on which technicians have stuck her prior to the bleeding beginning   The patient denies redness or swelling at the access site. The patient denies fever or chills at home or while on dialysis.   No recent shortening of the patient's walking distance or new symptoms consistent with claudication.  No history of rest pain symptoms. No new ulcers or wounds of the lower extremities have occurred.   The patient denies amaurosis fugax or recent TIA symptoms. There are no recent neurological changes noted. There is no history of DVT, PE or superficial thrombophlebitis. No recent episodes of angina or shortness of breath documented.    Duplex ultrasound of the AV access shows a patent access.  The previously noted stenosis is not significantly changed compared to last study.  Flow volume today is 873 cc/min (previous flow volume was 1418 cc/min)  No outpatient medications have been marked as taking for the 10/01/23 encounter (Appointment) with Gilda Crease, Latina Craver, MD.    Past Medical History:  Diagnosis Date   Anemia    Arthritis    Chronic kidney disease    ESRD   Complication of anesthesia    had procedure and felt incision early 80s   Gout    Hyperlipemia    Hypertension    Macular degeneration, right eye    Stroke [facility])     Past  Surgical History:  Procedure Laterality Date   A/V FISTULAGRAM Left 12/25/2018   Procedure: A/V FISTULAGRAM;  Surgeon: Renford Dills, MD;  Location: [facility];  Service: Cardiovascular;  Laterality: Left;   ABDOMINAL HYSTERECTOMY     APPENDECTOMY     AV FISTULA PLACEMENT Left 07/12/2018   Procedure: INSERTION OF ARTERIOVENOUS (AV) GORE-TEX GRAFT ARM ( BRACHIAL AXILLARY );  Surgeon: Renford Dills, MD;  Location: [facility] OR[facility];  Service: Vascular;  Laterality: Left;   COLONOSCOPY WITH PROPOFOL N/A 01/14/2016   Procedure: COLONOSCOPY WITH PROPOFOL;  Surgeon: Scot Jun, MD;  Location: [facility] ENDOSCOPY;  Service: Endoscopy;  Laterality: N/A;   COLONOSCOPY WITH PROPOFOL N/A 02/14/2022   Procedure: COLONOSCOPY WITH PROPOFOL;  Surgeon: Regis Bill, MD;  Location: [facility] ENDOSCOPY;  Service: Endoscopy;  Laterality: N/A;   TONSILLECTOMY      Social History Social History   Tobacco Use   Smoking status: Former   Smokeless tobacco: Never  Advertising account planner   Vaping status: Never Used  Substance Use Topics   Alcohol use: No   Drug use: No    Family History Family History  Problem Relation Age of Onset   Kidney disease Mother    Breast cancer Neg Hx     Allergies  Allergen Reactions   Other Other (See Comments)    Pt does not receive Blood  MRN : [id number]  Kristen Francis is a 77 y.o. (06/22/1946) female who presents with chief complaint of check access.  History of Present Illness:   The patient returns to the office for followup of their dialysis access.    The patient reports the function of the access has been stable. Patient denies difficulty with cannulation. The patient denies increased bleeding time after removing the needles. The patient denies hand pain or other symptoms consistent with steal phenomena.  No significant arm swelling.   The patient notes that it has been several years but nothing significant.  She notes that it is largely dependent on which technicians have stuck her prior to the bleeding beginning   The patient denies redness or swelling at the access site. The patient denies fever or chills at home or while on dialysis.   No recent shortening of the patient's walking distance or new symptoms consistent with claudication.  No history of rest pain symptoms. No new ulcers or wounds of the lower extremities have occurred.   The patient denies amaurosis fugax or recent TIA symptoms. There are no recent neurological changes noted. There is no history of DVT, PE or superficial thrombophlebitis. No recent episodes of angina or shortness of breath documented.    Duplex ultrasound of the AV access shows a patent access.  The previously noted stenosis is not significantly changed compared to last study.  Flow volume today is 873 cc/min (previous flow volume was 1418 cc/min)  No outpatient medications have been marked as taking for the 10/01/23 encounter (Appointment) with Gilda Crease, Latina Craver, MD.    Past Medical History:  Diagnosis Date   Anemia    Arthritis    Chronic kidney disease    ESRD   Complication of anesthesia    had procedure and felt incision early 80s   Gout    Hyperlipemia    Hypertension    Macular degeneration, right eye    Stroke [facility])     Past  Surgical History:  Procedure Laterality Date   A/V FISTULAGRAM Left 12/25/2018   Procedure: A/V FISTULAGRAM;  Surgeon: Renford Dills, MD;  Location: [facility];  Service: Cardiovascular;  Laterality: Left;   ABDOMINAL HYSTERECTOMY     APPENDECTOMY     AV FISTULA PLACEMENT Left 07/12/2018   Procedure: INSERTION OF ARTERIOVENOUS (AV) GORE-TEX GRAFT ARM ( BRACHIAL AXILLARY );  Surgeon: Renford Dills, MD;  Location: [facility];  Service: Vascular;  Laterality: Left;   COLONOSCOPY WITH PROPOFOL N/A 01/14/2016   Procedure: COLONOSCOPY WITH PROPOFOL;  Surgeon: Scot Jun, MD;  Location: [facility] ENDOSCOPY;  Service: Endoscopy;  Laterality: N/A;   COLONOSCOPY WITH PROPOFOL N/A 02/14/2022   Procedure: COLONOSCOPY WITH PROPOFOL;  Surgeon: Regis Bill, MD;  Location: [facility] ENDOSCOPY;  Service: Endoscopy;  Laterality: N/A;   TONSILLECTOMY      Social History Social History   Tobacco Use   Smoking status: Former   Smokeless tobacco: Never  Advertising account planner   Vaping status: Never Used  Substance Use Topics   Alcohol use: No   Drug use: No    Family History Family History  Problem Relation Age of Onset   Kidney disease Mother    Breast cancer Neg Hx     Allergies  Allergen Reactions   Other Other (See Comments)    Pt does not receive Blood  MRN : [id number]  Kristen Francis is a 77 y.o. (06/22/1946) female who presents with chief complaint of check access.  History of Present Illness:   The patient returns to the office for followup of their dialysis access.    The patient reports the function of the access has been stable. Patient denies difficulty with cannulation. The patient denies increased bleeding time after removing the needles. The patient denies hand pain or other symptoms consistent with steal phenomena.  No significant arm swelling.   The patient notes that it has been several years but nothing significant.  She notes that it is largely dependent on which technicians have stuck her prior to the bleeding beginning   The patient denies redness or swelling at the access site. The patient denies fever or chills at home or while on dialysis.   No recent shortening of the patient's walking distance or new symptoms consistent with claudication.  No history of rest pain symptoms. No new ulcers or wounds of the lower extremities have occurred.   The patient denies amaurosis fugax or recent TIA symptoms. There are no recent neurological changes noted. There is no history of DVT, PE or superficial thrombophlebitis. No recent episodes of angina or shortness of breath documented.    Duplex ultrasound of the AV access shows a patent access.  The previously noted stenosis is not significantly changed compared to last study.  Flow volume today is 873 cc/min (previous flow volume was 1418 cc/min)  No outpatient medications have been marked as taking for the 10/01/23 encounter (Appointment) with Gilda Crease, Latina Craver, MD.    Past Medical History:  Diagnosis Date   Anemia    Arthritis    Chronic kidney disease    ESRD   Complication of anesthesia    had procedure and felt incision early 80s   Gout    Hyperlipemia    Hypertension    Macular degeneration, right eye    Stroke [facility])     Past  Surgical History:  Procedure Laterality Date   A/V FISTULAGRAM Left 12/25/2018   Procedure: A/V FISTULAGRAM;  Surgeon: Renford Dills, MD;  Location: [facility];  Service: Cardiovascular;  Laterality: Left;   ABDOMINAL HYSTERECTOMY     APPENDECTOMY     AV FISTULA PLACEMENT Left 07/12/2018   Procedure: INSERTION OF ARTERIOVENOUS (AV) GORE-TEX GRAFT ARM ( BRACHIAL AXILLARY );  Surgeon: Renford Dills, MD;  Location: [facility];  Service: Vascular;  Laterality: Left;   COLONOSCOPY WITH PROPOFOL N/A 01/14/2016   Procedure: COLONOSCOPY WITH PROPOFOL;  Surgeon: Scot Jun, MD;  Location: [facility] ENDOSCOPY;  Service: Endoscopy;  Laterality: N/A;   COLONOSCOPY WITH PROPOFOL N/A 02/14/2022   Procedure: COLONOSCOPY WITH PROPOFOL;  Surgeon: Regis Bill, MD;  Location: [facility] ENDOSCOPY;  Service: Endoscopy;  Laterality: N/A;   TONSILLECTOMY      Social History Social History   Tobacco Use   Smoking status: Former   Smokeless tobacco: Never  Advertising account planner   Vaping status: Never Used  Substance Use Topics   Alcohol use: No   Drug use: No    Family History Family History  Problem Relation Age of Onset   Kidney disease Mother    Breast cancer Neg Hx     Allergies  Allergen Reactions   Other Other (See Comments)    Pt does not receive Blood

## 2023-10-01 NOTE — H&P (View-Only) (Signed)
MRN : [id number]  Kristen Francis is a 77 y.o. (06/22/1946) female who presents with chief complaint of check access.  History of Present Illness:   The patient returns to the office for followup of their dialysis access.    The patient reports the function of the access has been stable. Patient denies difficulty with cannulation. The patient denies increased bleeding time after removing the needles. The patient denies hand pain or other symptoms consistent with steal phenomena.  No significant arm swelling.   The patient notes that it has been several years but nothing significant.  She notes that it is largely dependent on which technicians have stuck her prior to the bleeding beginning   The patient denies redness or swelling at the access site. The patient denies fever or chills at home or while on dialysis.   No recent shortening of the patient's walking distance or new symptoms consistent with claudication.  No history of rest pain symptoms. No new ulcers or wounds of the lower extremities have occurred.   The patient denies amaurosis fugax or recent TIA symptoms. There are no recent neurological changes noted. There is no history of DVT, PE or superficial thrombophlebitis. No recent episodes of angina or shortness of breath documented.    Duplex ultrasound of the AV access shows a patent access.  The previously noted stenosis is not significantly changed compared to last study.  Flow volume today is 873 cc/min (previous flow volume was 1418 cc/min)  No outpatient medications have been marked as taking for the 10/01/23 encounter (Appointment) with Gilda Crease, Latina Craver, MD.    Past Medical History:  Diagnosis Date   Anemia    Arthritis    Chronic kidney disease    ESRD   Complication of anesthesia    had procedure and felt incision early 80s   Gout    Hyperlipemia    Hypertension    Macular degeneration, right eye    Stroke [facility])     Past  Surgical History:  Procedure Laterality Date   A/V FISTULAGRAM Left 12/25/2018   Procedure: A/V FISTULAGRAM;  Surgeon: Renford Dills, MD;  Location: [facility];  Service: Cardiovascular;  Laterality: Left;   ABDOMINAL HYSTERECTOMY     APPENDECTOMY     AV FISTULA PLACEMENT Left 07/12/2018   Procedure: INSERTION OF ARTERIOVENOUS (AV) GORE-TEX GRAFT ARM ( BRACHIAL AXILLARY );  Surgeon: Renford Dills, MD;  Location: [facility];  Service: Vascular;  Laterality: Left;   COLONOSCOPY WITH PROPOFOL N/A 01/14/2016   Procedure: COLONOSCOPY WITH PROPOFOL;  Surgeon: Scot Jun, MD;  Location: [facility] ENDOSCOPY;  Service: Endoscopy;  Laterality: N/A;   COLONOSCOPY WITH PROPOFOL N/A 02/14/2022   Procedure: COLONOSCOPY WITH PROPOFOL;  Surgeon: Regis Bill, MD;  Location: [facility] ENDOSCOPY;  Service: Endoscopy;  Laterality: N/A;   TONSILLECTOMY      Social History Social History   Tobacco Use   Smoking status: Former   Smokeless tobacco: Never  Advertising account planner   Vaping status: Never Used  Substance Use Topics   Alcohol use: No   Drug use: No    Family History Family History  Problem Relation Age of Onset   Kidney disease Mother    Breast cancer Neg Hx     Allergies  Allergen Reactions   Other Other (See Comments)    Pt does not receive Blood  MRN : [id number]  Kristen Francis is a 77 y.o. (06/22/1946) female who presents with chief complaint of check access.  History of Present Illness:   The patient returns to the office for followup of their dialysis access.    The patient reports the function of the access has been stable. Patient denies difficulty with cannulation. The patient denies increased bleeding time after removing the needles. The patient denies hand pain or other symptoms consistent with steal phenomena.  No significant arm swelling.   The patient notes that it has been several years but nothing significant.  She notes that it is largely dependent on which technicians have stuck her prior to the bleeding beginning   The patient denies redness or swelling at the access site. The patient denies fever or chills at home or while on dialysis.   No recent shortening of the patient's walking distance or new symptoms consistent with claudication.  No history of rest pain symptoms. No new ulcers or wounds of the lower extremities have occurred.   The patient denies amaurosis fugax or recent TIA symptoms. There are no recent neurological changes noted. There is no history of DVT, PE or superficial thrombophlebitis. No recent episodes of angina or shortness of breath documented.    Duplex ultrasound of the AV access shows a patent access.  The previously noted stenosis is not significantly changed compared to last study.  Flow volume today is 873 cc/min (previous flow volume was 1418 cc/min)  No outpatient medications have been marked as taking for the 10/01/23 encounter (Appointment) with Gilda Crease, Latina Craver, MD.    Past Medical History:  Diagnosis Date   Anemia    Arthritis    Chronic kidney disease    ESRD   Complication of anesthesia    had procedure and felt incision early 80s   Gout    Hyperlipemia    Hypertension    Macular degeneration, right eye    Stroke [facility])     Past  Surgical History:  Procedure Laterality Date   A/V FISTULAGRAM Left 12/25/2018   Procedure: A/V FISTULAGRAM;  Surgeon: Renford Dills, MD;  Location: [facility];  Service: Cardiovascular;  Laterality: Left;   ABDOMINAL HYSTERECTOMY     APPENDECTOMY     AV FISTULA PLACEMENT Left 07/12/2018   Procedure: INSERTION OF ARTERIOVENOUS (AV) GORE-TEX GRAFT ARM ( BRACHIAL AXILLARY );  Surgeon: Renford Dills, MD;  Location: [facility];  Service: Vascular;  Laterality: Left;   COLONOSCOPY WITH PROPOFOL N/A 01/14/2016   Procedure: COLONOSCOPY WITH PROPOFOL;  Surgeon: Scot Jun, MD;  Location: [facility] ENDOSCOPY;  Service: Endoscopy;  Laterality: N/A;   COLONOSCOPY WITH PROPOFOL N/A 02/14/2022   Procedure: COLONOSCOPY WITH PROPOFOL;  Surgeon: Regis Bill, MD;  Location: [facility] ENDOS[facility];  Service: Endoscopy;  Laterality: N/A;   TONSILLECTOMY      Social History Social History   Tobacco Use   Smoking status: Former   Smokeless tobacco: Never  Advertising account planner   Vaping status: Never Used  Substance Use Topics   Alcohol use: No   Drug use: No    Family History Family History  Problem Relation Age of Onset   Kidney disease Mother    Breast cancer Neg Hx     Allergies  Allergen Reactions   Other Other (See Comments)    Pt does not receive Blood  MRN : [id number]  Kristen Francis is a 77 y.o. (06/22/1946) female who presents with chief complaint of check access.  History of Present Illness:   The patient returns to the office for followup of their dialysis access.    The patient reports the function of the access has been stable. Patient denies difficulty with cannulation. The patient denies increased bleeding time after removing the needles. The patient denies hand pain or other symptoms consistent with steal phenomena.  No significant arm swelling.   The patient notes that it has been several years but nothing significant.  She notes that it is largely dependent on which technicians have stuck her prior to the bleeding beginning   The patient denies redness or swelling at the access site. The patient denies fever or chills at home or while on dialysis.   No recent shortening of the patient's walking distance or new symptoms consistent with claudication.  No history of rest pain symptoms. No new ulcers or wounds of the lower extremities have occurred.   The patient denies amaurosis fugax or recent TIA symptoms. There are no recent neurological changes noted. There is no history of DVT, PE or superficial thrombophlebitis. No recent episodes of angina or shortness of breath documented.    Duplex ultrasound of the AV access shows a patent access.  The previously noted stenosis is not significantly changed compared to last study.  Flow volume today is 873 cc/min (previous flow volume was 1418 cc/min)  No outpatient medications have been marked as taking for the 10/01/23 encounter (Appointment) with Gilda Crease, Latina Craver, MD.    Past Medical History:  Diagnosis Date   Anemia    Arthritis    Chronic kidney disease    ESRD   Complication of anesthesia    had procedure and felt incision early 80s   Gout    Hyperlipemia    Hypertension    Macular degeneration, right eye    Stroke [facility])     Past  Surgical History:  Procedure Laterality Date   A/V FISTULAGRAM Left 12/25/2018   Procedure: A/V FISTULAGRAM;  Surgeon: Renford Dills, MD;  Location: [facility];  Service: Cardiovascular;  Laterality: Left;   ABDOMINAL HYSTERECTOMY     APPENDECTOMY     AV FISTULA PLACEMENT Left 07/12/2018   Procedure: INSERTION OF ARTERIOVENOUS (AV) GORE-TEX GRAFT ARM ( BRACHIAL AXILLARY );  Surgeon: Renford Dills, MD;  Location: [facility];  Service: Vascular;  Laterality: Left;   COLONOSCOPY WITH PROPOFOL N/A 01/14/2016   Procedure: COLONOSCOPY WITH PROPOFOL;  Surgeon: Scot Jun, MD;  Location: [facility] ENDOSCOPY;  Service: Endoscopy;  Laterality: N/A;   COLONOSCOPY WITH PROPOFOL N/A 02/14/2022   Procedure: COLONOSCOPY WITH PROPOFOL;  Surgeon: Regis Bill, MD;  Location: [facility] ENDOSCOPY;  Service: Endoscopy;  Laterality: N/A;   TONSILLECTOMY      Social History Social History   Tobacco Use   Smoking status: Former   Smokeless tobacco: Never  Advertising account planner   Vaping status: Never Used  Substance Use Topics   Alcohol use: No   Drug use: No    Family History Family History  Problem Relation Age of Onset   Kidney disease Mother    Breast cancer Neg Hx     Allergies  Allergen Reactions   Other Other (See Comments)    Pt does not receive Blood

## 2023-10-03 DIAGNOSIS — Z992 Dependence on renal dialysis: Secondary | ICD-10-CM | POA: Diagnosis not present

## 2023-10-03 DIAGNOSIS — N2581 Secondary hyperparathyroidism of renal origin: Secondary | ICD-10-CM | POA: Diagnosis not present

## 2023-10-03 DIAGNOSIS — D689 Coagulation defect, unspecified: Secondary | ICD-10-CM | POA: Diagnosis not present

## 2023-10-03 DIAGNOSIS — D631 Anemia in chronic kidney disease: Secondary | ICD-10-CM | POA: Diagnosis not present

## 2023-10-03 DIAGNOSIS — N186 End stage renal disease: Secondary | ICD-10-CM | POA: Diagnosis not present

## 2023-10-03 DIAGNOSIS — R519 Headache, unspecified: Secondary | ICD-10-CM | POA: Diagnosis not present

## 2023-10-03 DIAGNOSIS — R52 Pain, unspecified: Secondary | ICD-10-CM | POA: Diagnosis not present

## 2023-10-04 DIAGNOSIS — Z992 Dependence on renal dialysis: Secondary | ICD-10-CM | POA: Diagnosis not present

## 2023-10-04 DIAGNOSIS — N040 Nephrotic syndrome with minor glomerular abnormality: Secondary | ICD-10-CM | POA: Diagnosis not present

## 2023-10-04 DIAGNOSIS — N186 End stage renal disease: Secondary | ICD-10-CM | POA: Diagnosis not present

## 2023-10-05 DIAGNOSIS — D689 Coagulation defect, unspecified: Secondary | ICD-10-CM | POA: Diagnosis not present

## 2023-10-05 DIAGNOSIS — R52 Pain, unspecified: Secondary | ICD-10-CM | POA: Diagnosis not present

## 2023-10-05 DIAGNOSIS — N2581 Secondary hyperparathyroidism of renal origin: Secondary | ICD-10-CM | POA: Diagnosis not present

## 2023-10-05 DIAGNOSIS — Z992 Dependence on renal dialysis: Secondary | ICD-10-CM | POA: Diagnosis not present

## 2023-10-05 DIAGNOSIS — D631 Anemia in chronic kidney disease: Secondary | ICD-10-CM | POA: Diagnosis not present

## 2023-10-05 DIAGNOSIS — D509 Iron deficiency anemia, unspecified: Secondary | ICD-10-CM | POA: Diagnosis not present

## 2023-10-05 DIAGNOSIS — N186 End stage renal disease: Secondary | ICD-10-CM | POA: Diagnosis not present

## 2023-10-08 ENCOUNTER — Telehealth (INDEPENDENT_AMBULATORY_CARE_PROVIDER_SITE_OTHER): Payer: Self-pay

## 2023-10-08 DIAGNOSIS — R52 Pain, unspecified: Secondary | ICD-10-CM | POA: Diagnosis not present

## 2023-10-08 DIAGNOSIS — N2581 Secondary hyperparathyroidism of renal origin: Secondary | ICD-10-CM | POA: Diagnosis not present

## 2023-10-08 DIAGNOSIS — D689 Coagulation defect, unspecified: Secondary | ICD-10-CM | POA: Diagnosis not present

## 2023-10-08 DIAGNOSIS — D509 Iron deficiency anemia, unspecified: Secondary | ICD-10-CM | POA: Diagnosis not present

## 2023-10-08 DIAGNOSIS — Z992 Dependence on renal dialysis: Secondary | ICD-10-CM | POA: Diagnosis not present

## 2023-10-08 DIAGNOSIS — D631 Anemia in chronic kidney disease: Secondary | ICD-10-CM | POA: Diagnosis not present

## 2023-10-08 DIAGNOSIS — N186 End stage renal disease: Secondary | ICD-10-CM | POA: Diagnosis not present

## 2023-10-08 NOTE — Telephone Encounter (Signed)
I contacted the patient to schedule her for a left arm fistulagram with Dr. Gilda Crease. Patient was offered 10/09/23, 10/16/23 and declined stating she had something to do on Tuesday's. Patient wanted to be scheduled for middle of December. I advised that we are trying to make sure her access stays in great working order so going out that far could be pushing it too much. Patient stated she would call back after checking on rescheduling an appt.

## 2023-10-08 NOTE — Telephone Encounter (Signed)
Patient returned my call and is now scheduled with Dr. Gilda Crease for a left arm fistulagram with a 12:00 pm arrival time to the [facility]. Pre-procedure instructions were discussed and patient stated she wrote them down. Patient just came back from dialysis and seemed and stated that she was confused. Patient was asked if she had someone else I could speak with regarding her procedure, she stated she did not and her husband was bringing her to the hospital. Patient kept repeating that she was confused but she would come. Patient does not have Mychart.

## 2023-10-09 ENCOUNTER — Ambulatory Visit
Admission: RE | Admit: 2023-10-09 | Discharge: 2023-10-09 | Disposition: A | Discharge status: Home / Self Care | Payer: Medicare Other | Attending: Vascular Surgery | Admitting: Vascular Surgery

## 2023-10-09 ENCOUNTER — Encounter: Payer: Self-pay | Admitting: Vascular Surgery

## 2023-10-09 ENCOUNTER — Encounter
Admission: RE | Disposition: A | Discharge status: Home / Self Care | Payer: Self-pay | Source: Home / Self Care | Attending: Vascular Surgery

## 2023-10-09 DIAGNOSIS — T82898A Other specified complication of vascular prosthetic devices, implants and grafts, initial encounter: Secondary | ICD-10-CM

## 2023-10-09 DIAGNOSIS — E1122 Type 2 diabetes mellitus with diabetic chronic kidney disease: Secondary | ICD-10-CM | POA: Insufficient documentation

## 2023-10-09 DIAGNOSIS — N186 End stage renal disease: Secondary | ICD-10-CM | POA: Diagnosis not present

## 2023-10-09 DIAGNOSIS — E785 Hyperlipidemia, unspecified: Secondary | ICD-10-CM | POA: Diagnosis not present

## 2023-10-09 DIAGNOSIS — T82858A Stenosis of vascular prosthetic devices, implants and grafts, initial encounter: Secondary | ICD-10-CM | POA: Insufficient documentation

## 2023-10-09 DIAGNOSIS — I6522 Occlusion and stenosis of left carotid artery: Secondary | ICD-10-CM | POA: Insufficient documentation

## 2023-10-09 DIAGNOSIS — E1151 Type 2 diabetes mellitus with diabetic peripheral angiopathy without gangrene: Secondary | ICD-10-CM | POA: Diagnosis not present

## 2023-10-09 DIAGNOSIS — Z992 Dependence on renal dialysis: Secondary | ICD-10-CM | POA: Diagnosis not present

## 2023-10-09 DIAGNOSIS — I2510 Atherosclerotic heart disease of native coronary artery without angina pectoris: Secondary | ICD-10-CM | POA: Insufficient documentation

## 2023-10-09 DIAGNOSIS — Y841 Kidney dialysis as the cause of abnormal reaction of the patient, or of later complication, without mention of misadventure at the time of the procedure: Secondary | ICD-10-CM | POA: Insufficient documentation

## 2023-10-09 DIAGNOSIS — I120 Hypertensive chronic kidney disease with stage 5 chronic kidney disease or end stage renal disease: Secondary | ICD-10-CM | POA: Diagnosis not present

## 2023-10-09 HISTORY — PX: A/V FISTULAGRAM: CATH118298

## 2023-10-09 LAB — POTASSIUM (ARMC VASCULAR LAB ONLY): Potassium (ARMC vascular lab): 4 mmol/L (ref 3.5–5.1)

## 2023-10-09 SURGERY — A/V FISTULAGRAM
Anesthesia: Moderate Sedation | Laterality: Left

## 2023-10-09 MED ORDER — HEPARIN SODIUM (PORCINE) 1000 UNIT/ML IJ SOLN
INTRAMUSCULAR | Status: AC
  Filled 2023-10-09: qty 10

## 2023-10-09 MED ORDER — MIDAZOLAM HCL 2 MG/ML PO SYRP: 8.0000 mg | ORAL_SOLUTION | Freq: Once | ORAL | Status: DC | PRN

## 2023-10-09 MED ORDER — MIDAZOLAM HCL 2 MG/2ML IJ SOLN
INTRAMUSCULAR | Status: AC
  Filled 2023-10-09: qty 2

## 2023-10-09 MED ORDER — CEFAZOLIN SODIUM-DEXTROSE 1-4 GM/50ML-% IV SOLN
1.0000 g | INTRAVENOUS | Status: AC
  Administered 2023-10-09: 1 g via INTRAVENOUS

## 2023-10-09 MED ORDER — IODIXANOL 320 MG/ML IV SOLN
INTRAVENOUS | Status: DC | PRN
  Administered 2023-10-09: 25 mL via INTRAVENOUS

## 2023-10-09 MED ORDER — HEPARIN (PORCINE) IN NACL 1000-0.9 UT/500ML-% IV SOLN
INTRAVENOUS | Status: DC | PRN
  Administered 2023-10-09: 1000 mL

## 2023-10-09 MED ORDER — ONDANSETRON HCL 4 MG/2ML IJ SOLN: 4.0000 mg | Freq: Four times a day (QID) | INTRAMUSCULAR | Status: DC | PRN

## 2023-10-09 MED ORDER — FENTANYL CITRATE PF 50 MCG/ML IJ SOSY
PREFILLED_SYRINGE | INTRAMUSCULAR | Status: AC
  Filled 2023-10-09: qty 1

## 2023-10-09 MED ORDER — LIDOCAINE HCL (PF) 1 % IJ SOLN
INTRAMUSCULAR | Status: DC | PRN
  Administered 2023-10-09: 5 mL via INTRADERMAL

## 2023-10-09 MED ORDER — FENTANYL CITRATE (PF) 100 MCG/2ML IJ SOLN
INTRAMUSCULAR | Status: DC | PRN
  Administered 2023-10-09 (×2): 50 ug via INTRAVENOUS

## 2023-10-09 MED ORDER — CEFAZOLIN SODIUM-DEXTROSE 1-4 GM/50ML-% IV SOLN
INTRAVENOUS | Status: AC
  Filled 2023-10-09: qty 50

## 2023-10-09 MED ORDER — MIDAZOLAM HCL 2 MG/2ML IJ SOLN
INTRAMUSCULAR | Status: DC | PRN
  Administered 2023-10-09: 1 mg via INTRAVENOUS
  Administered 2023-10-09: 2 mg via INTRAVENOUS

## 2023-10-09 MED ORDER — HYDROMORPHONE HCL 1 MG/ML IJ SOLN: 1.0000 mg | Freq: Once | INTRAMUSCULAR | Status: DC | PRN

## 2023-10-09 MED ORDER — METHYLPREDNISOLONE SODIUM SUCC 125 MG IJ SOLR: 125.0000 mg | Freq: Once | INTRAMUSCULAR | Status: DC | PRN

## 2023-10-09 MED ORDER — SODIUM CHLORIDE 0.9 % IV SOLN
INTRAVENOUS | Status: DC
  Administered 2023-10-09: 500 mL via INTRAVENOUS

## 2023-10-09 MED ORDER — FAMOTIDINE 20 MG PO TABS: 40.0000 mg | ORAL_TABLET | Freq: Once | ORAL | Status: DC | PRN

## 2023-10-09 MED ORDER — HEPARIN SODIUM (PORCINE) 1000 UNIT/ML IJ SOLN
INTRAMUSCULAR | Status: DC | PRN
  Administered 2023-10-09: 4000 [IU] via INTRAVENOUS

## 2023-10-09 MED ORDER — DIPHENHYDRAMINE HCL 50 MG/ML IJ SOLN: 50.0000 mg | Freq: Once | INTRAMUSCULAR | Status: DC | PRN

## 2023-10-09 SURGICAL SUPPLY — 17 items
BALLN DORADO 10X40X80 (BALLOONS) ×1
BALLN DORADO 8X60X80 (BALLOONS) ×1
BALLOON DORADO 10X40X80 (BALLOONS) IMPLANT
BALLOON DORADO 8X60X80 (BALLOONS) IMPLANT
CATH BEACON 5 .035 40 KMP TP (CATHETERS) IMPLANT
COVER PROBE ULTRASOUND 5X96 (MISCELLANEOUS) IMPLANT
DEVICE PRESTO INFLATION (MISCELLANEOUS) IMPLANT
DRAPE BRACHIAL (DRAPES) IMPLANT
GLIDEWIRE ADV .035X180CM (WIRE) IMPLANT
GOWN STRL REUS W/ TWL LRG LVL3 (GOWN DISPOSABLE) ×1 IMPLANT
GOWN STRL REUS W/TWL LRG LVL3 (GOWN DISPOSABLE) ×1
NDL ENTRY 21GA 7CM ECHOTIP (NEEDLE) IMPLANT
NEEDLE ENTRY 21GA 7CM ECHOTIP (NEEDLE) ×1 IMPLANT
PACK ANGIOGRAPHY (CUSTOM PROCEDURE TRAY) ×1 IMPLANT
SET INTRO CAPELLA COAXIAL (SET/KITS/TRAYS/PACK) IMPLANT
SHEATH BRITE TIP 6FRX5.5 (SHEATH) IMPLANT
SUT MNCRL AB 4-0 PS2 18 (SUTURE) IMPLANT

## 2023-10-09 NOTE — Op Note (Signed)
OPERATIVE NOTE   PROCEDURE: Contrast injection left arm brachial axillary AV access Percutaneous transluminal angioplasty multiple locations peripheral segment with an 8 mm balloon. Percutaneous transluminal angioplasty venous outflow, peripheral segment, with a 10 mm balloon  PRE-OPERATIVE DIAGNOSIS: Complication of dialysis access                                                       End Stage Renal Disease  POST-OPERATIVE DIAGNOSIS: same as above   SURGEON: Renford Dills, M.D.  ANESTHESIA: Conscious sedation was administered under my direct supervision by the interventional radiology RN. IV Versed plus fentanyl were utilized. Continuous ECG, pulse oximetry and blood pressure was monitored throughout the entire procedure.  Conscious sedation was for a total of 38 minutes.  ESTIMATED BLOOD LOSS: minimal  FINDING(S): Stricture of the AV graft  SPECIMEN(S):  None  CONTRAST: 25 cc  FLUOROSCOPY TIME: 3.4 minutes  INDICATIONS: Kristen Francis is a 77 y.o. female who  presents with malfunctioning left arm AV access.  The patient is scheduled for angiography with possible intervention of the AV access.  The patient is aware the risks include but are not limited to: bleeding, infection, thrombosis of the cannulated access, and possible anaphylactic reaction to the contrast.  The patient acknowledges if the access can not be salvaged a tunneled catheter will be needed and will be placed during this procedure.  The patient is aware of the risks of the procedure and elects to proceed with the angiogram and intervention.  DESCRIPTION: After full informed written consent was obtained, the patient was brought back to the Special Procedure suite and placed supine position.  Appropriate cardiopulmonary monitors were placed.  The left arm was prepped and draped in the standard fashion.  Appropriate timeout is called. The left brachial axillary AV graft was cannulated with a micropuncture  needle.  Cannulation was performed with ultrasound guidance. Ultrasound was placed in a sterile sleeve, the AV access was interrogated and noted to be echolucent and compressible indicating patency. Image was recorded for the permanent record. The puncture is performed under continuous ultrasound visualization.   The microwire was advanced and the needle was exchanged for  a microsheath.  The J-wire was then advanced and a 6 Fr sheath inserted.  Hand injections were completed to image the access from the arterial anastomosis through the entire access.  The central venous structures were also imaged by hand injections.  Based on the images, there are 4 areas within the graft itself that have greater than 70% stenosis.  The 2 aneurysmal areas are essentially unchanged from previous study.  The venous outflow appears to have a narrowing but it is difficult to be sure given the way the existing stent overlies the axillary vein.  Central veins are widely patent.  Arterial inflow is widely patent.  4000 units of heparin was given and a wire was negotiated through the strictures within the venous portion of the graft.  An 8 mm x 60 mm Dorado balloon was used.  3 serial inflations were performed to between 12 atm and 26 atm.  Inflations were for 1 minute each.  Follow-up imaging demonstrated a marked improvement in the overall appearance of the graft but by palpation there was still significantly pulsatile all the way to the axilla and I suspected a stenosis at  the venous outflow.  A 10 mm x 40 mm Dorado balloon was then advanced across the venous outflow and inflated to 8 atm for 1 minute.  A severe waist was noted upon inflation consistent with the suspected stricture.  Follow-up imaging now demonstrated less than 10% residual stenosis throughout the entire AV graft.  Follow-up imaging demonstrates complete resolution of the stricture with rapid flow of contrast through the graft, the central venous anatomy is  preserved.  As noted above less than 10% residual stenosis remains.  A 4-0 Monocryl purse-string suture was sewn around the sheath.  The sheath was removed and light pressure was applied.  A sterile bandage was applied to the puncture site.    COMPLICATIONS: None  CONDITION: Kristen Francis, M.D [facility] Office: [phone]  10/09/2023 2:15 PM

## 2023-10-09 NOTE — Interval H&P Note (Signed)
History and Physical Interval Note:  10/09/2023 1:22 PM  Kristen Francis  has presented today for surgery, with the diagnosis of L arm fistulagram  End Stage Renal.  The various methods of treatment have been discussed with the patient and family. After consideration of risks, benefits and other options for treatment, the patient has consented to  Procedure(s): A/V Fistulagram (Left) as a surgical intervention.  The patient's history has been reviewed, patient examined, no change in status, stable for surgery.  I have reviewed the patient's chart and labs.  Questions were answered to the patient's satisfaction.     Levora Dredge

## 2023-10-10 ENCOUNTER — Encounter: Payer: Self-pay | Admitting: Vascular Surgery

## 2023-10-10 NOTE — Telephone Encounter (Signed)
Error

## 2023-10-12 DIAGNOSIS — Z992 Dependence on renal dialysis: Secondary | ICD-10-CM | POA: Diagnosis not present

## 2023-10-12 DIAGNOSIS — R52 Pain, unspecified: Secondary | ICD-10-CM | POA: Diagnosis not present

## 2023-10-12 DIAGNOSIS — D509 Iron deficiency anemia, unspecified: Secondary | ICD-10-CM | POA: Diagnosis not present

## 2023-10-12 DIAGNOSIS — D631 Anemia in chronic kidney disease: Secondary | ICD-10-CM | POA: Diagnosis not present

## 2023-10-12 DIAGNOSIS — N186 End stage renal disease: Secondary | ICD-10-CM | POA: Diagnosis not present

## 2023-10-12 DIAGNOSIS — H0100A Unspecified blepharitis right eye, upper and lower eyelids: Secondary | ICD-10-CM | POA: Diagnosis not present

## 2023-10-12 DIAGNOSIS — H04123 Dry eye syndrome of bilateral lacrimal glands: Secondary | ICD-10-CM | POA: Diagnosis not present

## 2023-10-12 DIAGNOSIS — N2581 Secondary hyperparathyroidism of renal origin: Secondary | ICD-10-CM | POA: Diagnosis not present

## 2023-10-12 DIAGNOSIS — D689 Coagulation defect, unspecified: Secondary | ICD-10-CM | POA: Diagnosis not present

## 2023-10-15 ENCOUNTER — Encounter: Payer: Self-pay | Admitting: Vascular Surgery

## 2023-10-15 ENCOUNTER — Other Ambulatory Visit: Payer: Self-pay | Admitting: Nurse Practitioner

## 2023-10-15 DIAGNOSIS — Z992 Dependence on renal dialysis: Secondary | ICD-10-CM | POA: Diagnosis not present

## 2023-10-15 DIAGNOSIS — I10 Essential (primary) hypertension: Secondary | ICD-10-CM

## 2023-10-15 DIAGNOSIS — R630 Anorexia: Secondary | ICD-10-CM

## 2023-10-15 DIAGNOSIS — D509 Iron deficiency anemia, unspecified: Secondary | ICD-10-CM | POA: Diagnosis not present

## 2023-10-15 DIAGNOSIS — D689 Coagulation defect, unspecified: Secondary | ICD-10-CM | POA: Diagnosis not present

## 2023-10-15 DIAGNOSIS — D631 Anemia in chronic kidney disease: Secondary | ICD-10-CM | POA: Diagnosis not present

## 2023-10-15 DIAGNOSIS — N186 End stage renal disease: Secondary | ICD-10-CM | POA: Diagnosis not present

## 2023-10-15 DIAGNOSIS — R110 Nausea: Secondary | ICD-10-CM

## 2023-10-15 DIAGNOSIS — R52 Pain, unspecified: Secondary | ICD-10-CM | POA: Diagnosis not present

## 2023-10-15 DIAGNOSIS — N2581 Secondary hyperparathyroidism of renal origin: Secondary | ICD-10-CM | POA: Diagnosis not present

## 2023-10-16 ENCOUNTER — Ambulatory Visit: Payer: Medicare Other | Admitting: Nurse Practitioner

## 2023-10-17 ENCOUNTER — Ambulatory Visit
Admission: RE | Admit: 2023-10-17 | Discharge: 2023-10-17 | Disposition: A | Discharge status: Home / Self Care | Payer: Medicare Other | Source: Ambulatory Visit | Attending: Nurse Practitioner | Admitting: Nurse Practitioner

## 2023-10-17 DIAGNOSIS — D631 Anemia in chronic kidney disease: Secondary | ICD-10-CM | POA: Diagnosis not present

## 2023-10-17 DIAGNOSIS — D509 Iron deficiency anemia, unspecified: Secondary | ICD-10-CM | POA: Diagnosis not present

## 2023-10-17 DIAGNOSIS — Z1231 Encounter for screening mammogram for malignant neoplasm of breast: Secondary | ICD-10-CM | POA: Diagnosis not present

## 2023-10-17 DIAGNOSIS — D689 Coagulation defect, unspecified: Secondary | ICD-10-CM | POA: Diagnosis not present

## 2023-10-17 DIAGNOSIS — R52 Pain, unspecified: Secondary | ICD-10-CM | POA: Diagnosis not present

## 2023-10-17 DIAGNOSIS — N2581 Secondary hyperparathyroidism of renal origin: Secondary | ICD-10-CM | POA: Diagnosis not present

## 2023-10-17 DIAGNOSIS — N186 End stage renal disease: Secondary | ICD-10-CM | POA: Diagnosis not present

## 2023-10-17 DIAGNOSIS — Z992 Dependence on renal dialysis: Secondary | ICD-10-CM | POA: Diagnosis not present

## 2023-10-17 MED ORDER — HYDRALAZINE HCL 50 MG PO TABS: ORAL_TABLET | ORAL | 2 refills | Status: DC

## 2023-10-18 ENCOUNTER — Telehealth: Payer: Self-pay | Admitting: Nurse Practitioner

## 2023-10-18 ENCOUNTER — Ambulatory Visit (INDEPENDENT_AMBULATORY_CARE_PROVIDER_SITE_OTHER): Payer: Medicare Other | Admitting: Nurse Practitioner

## 2023-10-18 ENCOUNTER — Encounter: Payer: Self-pay | Admitting: Nurse Practitioner

## 2023-10-18 VITALS — BP 155/62 | HR 71 | Temp 97.3°F | Resp 16 | Ht 63.0 in | Wt 114.8 lb

## 2023-10-18 DIAGNOSIS — E782 Mixed hyperlipidemia: Secondary | ICD-10-CM | POA: Diagnosis not present

## 2023-10-18 DIAGNOSIS — R630 Anorexia: Secondary | ICD-10-CM

## 2023-10-18 DIAGNOSIS — R7989 Other specified abnormal findings of blood chemistry: Secondary | ICD-10-CM

## 2023-10-18 DIAGNOSIS — R634 Abnormal weight loss: Secondary | ICD-10-CM

## 2023-10-18 DIAGNOSIS — N186 End stage renal disease: Secondary | ICD-10-CM | POA: Diagnosis not present

## 2023-10-18 DIAGNOSIS — Z992 Dependence on renal dialysis: Secondary | ICD-10-CM

## 2023-10-18 DIAGNOSIS — R0602 Shortness of breath: Secondary | ICD-10-CM | POA: Diagnosis not present

## 2023-10-18 MED ORDER — DRONABINOL 5 MG PO CAPS: ORAL_CAPSULE | ORAL | 2 refills | Status: DC

## 2023-10-18 MED ORDER — ROSUVASTATIN CALCIUM 10 MG PO TABS: ORAL_TABLET | ORAL | 1 refills | Status: DC

## 2023-10-18 NOTE — Telephone Encounter (Signed)
Urgent Cardiology referral sent via [employer] to Dr. Juliann Pares w/ [facility]. Notified patient. Gave pt telephone # [phone]

## 2023-10-18 NOTE — Progress Notes (Signed)
[facility] [address] [place], [place] [address]  Internal MEDICINE  Office Visit Note  Patient Name: Natashia Deininger  [id number]  [id number]  Date of Service: 10/18/2023  Chief Complaint  Patient presents with   Hypertension   Hyperlipidemia   Follow-up    HPI Melisande presents for a follow-up visit for nausea, poor appetite, high BNP and high cholesterol.  Poor appetite -- still low appetite, related to multiple medical conditions, esp dialysis  Nausea slightly improved per patient but then she states that she just forces herself to eat.  Weight loss still low normal weight on BMI, but has gained 3 lbs in the past month High BNP, SOB on exertion -- had elevated troponin a few months ago in ER but did not stay to be evaluated by the provider. She was seen in office for a follow up visit. Additional labs and echocardiogram were ordered. The troponin lab was not done correctly so there was no result for a follow up troponin level. The pro-B natriuretic peptide resulted as severe elevated at 60,010. She also reports SOB with exertion.  concern for heart failure, need cardiology referral  High cholesterol -- due for refills of rosuvastatin, takes this on her non-dialysis days.     Current Medication: Outpatient Encounter Medications as of 10/18/2023  Medication Sig   amLODipine (NORVASC) 10 MG tablet TAKE 1 TABLET(10 MG) BY MOUTH DAILY   aspirin EC 81 MG EC tablet Take 1 tablet (81 mg total) by mouth daily. Swallow whole.   B Complex-C-Folic Acid (RENAL-VITE) 0.8 MG TABS Take 0.8 mg by mouth every evening. On dialysis days   benzonatate (TESSALON) 100 MG capsule Take 1 capsule (100 mg total) by mouth 2 (two) times daily as needed for cough.   carvedilol (COREG) 25 MG tablet TAKE 1 TABLET(25 MG) BY MOUTH TWICE DAILY WITH A MEAL   cholecalciferol (VITAMIN D) 1000 units tablet Take 1,000 Units by mouth daily.    cinacalcet (SENSIPAR) 60 MG tablet Take 60 mg by mouth  every evening.   ferrous sulfate 325 (65 FE) MG tablet Take 1 tablet (325 mg total) by mouth 2 (two) times daily with a meal.   hydrALAZINE (APRESOLINE) 50 MG tablet TAKE 1 TABLET(50 MG) BY MOUTH THREE TIMES DAILY.   HYDROcodone-acetaminophen (NORCO/VICODIN) 5-325 MG tablet Take 1 tablet by mouth every 4 (four) hours as needed for moderate pain.   lidocaine-prilocaine (EMLA) cream APPLY SMALL AMOUNT TO ACCESS SITE (AVF) 3 TIMES A WEEK 1 HOUR BEFORE DIALYSIS. COVER WITH OCCLUSIVE DRESSING (SARAN WRAP)   losartan (COZAAR) 50 MG tablet TAKE 1 TABLET(50 MG) BY MOUTH DAILY   metoCLOPramide (REGLAN) 5 MG tablet Take 1 tablet (5 mg total) by mouth 4 (four) times daily -  before meals and at bedtime.   omeprazole (PRILOSEC) 40 MG capsule TAKE 1 CAPSULE(40 MG) BY MOUTH DAILY   predniSONE (DELTASONE) 50 MG tablet Take 1 tablet (50 mg total) by mouth daily with breakfast.   sennosides-docusate sodium (SENOKOT-S) 8.6-50 MG tablet Take 2 tablets by mouth daily.   sevelamer carbonate (RENVELA) 800 MG tablet Take 1,600 mg by mouth 3 (three) times daily.   VELPHORO 500 MG chewable tablet Chew 500 mg by mouth 3 (three) times daily.   [DISCONTINUED] dronabinol (MARINOL) 5 MG capsule Take 1 capsule by mouth before meals twice daily on non-dialysis days   [DISCONTINUED] rosuvastatin (CRESTOR) 10 MG tablet Take 1 tablet by mouth on non-dialysis days.   [DISCONTINUED] rosuvastatin (CRESTOR) 5  MG tablet TAKE 1 TABLET BY MOUTH EVERY DAY. TAKE ON DAYS NOT HAVING DIALYSIS   dronabinol (MARINOL) 5 MG capsule Take 1 capsule by mouth before meals twice daily on non-dialysis days   furosemide (LASIX) 80 MG tablet Take 1 tablet (80 mg total) by mouth 4 (four) times a week. On non-dialysis days   rosuvastatin (CRESTOR) 10 MG tablet Take 1 tablet by mouth on non-dialysis days.   No facility-administered encounter medications on file as of 10/18/2023.    Surgical History: Past Surgical History:  Procedure Laterality Date    A/V FISTULAGRAM Left 12/25/2018   Procedure: A/V FISTULAGRAM;  Surgeon: Renford Dills, MD;  Location: [facility] INVASIVE CV LAB;  Service: Cardiovascular;  Laterality: Left;   A/V FISTULAGRAM Left 10/09/2023   Procedure: A/V Fistulagram;  Surgeon: Renford Dills, MD;  Location: [facility] INVASIVE CV LAB;  Service: Cardiovascular;  Laterality: Left;   ABDOMINAL HYSTERECTOMY     APPENDECTOMY     AV FISTULA PLACEMENT Left 07/12/2018   Procedure: INSERTION OF ARTERIOVENOUS (AV) GORE-TEX GRAFT ARM ( BRACHIAL AXILLARY );  Surgeon: Renford Dills, MD;  Location: [facility] ORS;  Service: Vascular;  Laterality: Left;   COLONOSCOPY WITH PROPOFOL N/A 01/14/2016   Procedure: COLONOSCOPY WITH PROPOFOL;  Surgeon: Scot Jun, MD;  Location: [facility] ENDOSCOPY;  Service: Endoscopy;  Laterality: N/A;   COLONOSCOPY WITH PROPOFOL N/A 02/14/2022   Procedure: COLONOSCOPY WITH PROPOFOL;  Surgeon: Regis Bill, MD;  Location: [facility] ENDOSCOPY;  Service: Endoscopy;  Laterality: N/A;   TONSILLECTOMY      Medical History: Past Medical History:  Diagnosis Date   Anemia    Arthritis    Chronic kidney disease    ESRD   Complication of anesthesia    had procedure and felt incision early 80s   Gout    Hyperlipemia    Hypertension    Macular degeneration, right eye    Stroke (HCC)     Family History: Family History  Problem Relation Age of Onset   Kidney disease Mother    Breast cancer Neg Hx     Social History   Socioeconomic History   Marital status: Married    Spouse name: Not on file   Number of children: Not on file   Years of education: Not on file   Highest education level: Not on file  Occupational History   Not on file  Tobacco Use   Smoking status: Former   Smokeless tobacco: Never  Vaping Use   Vaping status: Never Used  Substance and Sexual Activity   Alcohol use: No   Drug use: No   Sexual activity: Not on file  Other Topics Concern   Not on file  Social History Narrative    Lives in  [place]; used to clean; no smoking; no alcohol.    Social Determinants of Health   Financial Resource Strain: Not on file  Food Insecurity: Not on file  Transportation Needs: Not on file  Physical Activity: Not on file  Stress: Not on file  Social Connections: Not on file  Intimate Partner Violence: Not on file      Review of Systems  Constitutional:  Positive for activity change, appetite change and fatigue.  Respiratory:  Positive for shortness of breath. Negative for cough, chest tightness and wheezing.   Cardiovascular: Negative.  Negative for chest pain and palpitations.  Gastrointestinal:  Positive for nausea.  Neurological:  Positive for weakness.    Vital Signs: BP (!) 155/62 Comment:  160/70  Pulse 71   Temp (!) 97.3 F (36.3 C)   Resp 16   Ht 5\' 3"  (1.6 m)   Wt 114 lb 12.8 oz (52.1 kg)   SpO2 95%   BMI 20.34 kg/m    Physical Exam Vitals reviewed.  Constitutional:      General: She is not in acute distress.    Appearance: Normal appearance. She is normal weight. She is ill-appearing.  HENT:     Head: Normocephalic and atraumatic.  Eyes:     Pupils: Pupils are equal, round, and reactive to light.  Cardiovascular:     Rate and Rhythm: Normal rate. Rhythm irregular.     Heart sounds: Murmur heard.  Pulmonary:     Effort: Pulmonary effort is normal. No respiratory distress.     Breath sounds: Normal breath sounds.  Neurological:     Mental Status: She is alert and oriented to person, place, and time.  Psychiatric:        Mood and Affect: Mood normal.        Behavior: Behavior normal.        Assessment/Plan: 1. SOB (shortness of breath) on exertion Referred to cardiology for further workup - Ambulatory referral to Cardiology  2. Elevated brain natriuretic peptide (BNP) level Referred to cardiology for further workup - Ambulatory referral to Cardiology  3. Poor appetite Dronabinol prescription resent, will try to get prior auth  approved  - dronabinol (MARINOL) 5 MG capsule; Take 1 capsule by mouth before meals twice daily on non-dialysis days  Dispense: 40 capsule; Refill: 2  4. Unintentional weight loss Dronabinol prescription resent, will try to get prior auth approved  - dronabinol (MARINOL) 5 MG capsule; Take 1 capsule by mouth before meals twice daily on non-dialysis days  Dispense: 40 capsule; Refill: 2  5. Mixed hyperlipidemia Continue rosuvastatin 10 mg as prescribed  - rosuvastatin (CRESTOR) 10 MG tablet; Take 1 tablet by mouth on non-dialysis days.  Dispense: 90 tablet; Refill: 1  6. ESRD (end stage renal disease) on dialysis [facility]) Sees nephrology, goes to dialysis 3 days a week, had dialysis yesterday   General Counseling: Marilynne verbalizes understanding of the findings of todays visit and agrees with plan of treatment. I have discussed any further diagnostic evaluation that may be needed or ordered today. We also reviewed her medications today. she has been encouraged to call the office with any questions or concerns that should arise related to todays visit.    Orders Placed This Encounter  Procedures   Ambulatory referral to Cardiology    Meds ordered this encounter  Medications   dronabinol (MARINOL) 5 MG capsule    Sig: Take 1 capsule by mouth before meals twice daily on non-dialysis days    Dispense:  40 capsule    Refill:  2   rosuvastatin (CRESTOR) 10 MG tablet    Sig: Take 1 tablet by mouth on non-dialysis days.    Dispense:  90 tablet    Refill:  1    Please discontinue any previous rosuvastatin orders and fill this one now for 10 mg dose    Return in about 4 weeks (around 11/15/2023) for F/U, Aireonna Bauer PCP dronabinol and weight loss .   Total time spent:30 Minutes Time spent includes review of chart, medications, test results, and follow up plan with the patient.   [place] Controlled Substance Database was reviewed by me.  This patient was seen by Sallyanne Kuster, FNP-C in  collaboration with Dr. Beverely Risen  as a part of collaborative care agreement.   Zaiyden Strozier R. Tedd Sias, MSN, FNP-C Internal medicine

## 2023-10-19 DIAGNOSIS — D631 Anemia in chronic kidney disease: Secondary | ICD-10-CM | POA: Diagnosis not present

## 2023-10-19 DIAGNOSIS — D689 Coagulation defect, unspecified: Secondary | ICD-10-CM | POA: Diagnosis not present

## 2023-10-19 DIAGNOSIS — R52 Pain, unspecified: Secondary | ICD-10-CM | POA: Diagnosis not present

## 2023-10-19 DIAGNOSIS — N186 End stage renal disease: Secondary | ICD-10-CM | POA: Diagnosis not present

## 2023-10-19 DIAGNOSIS — N2581 Secondary hyperparathyroidism of renal origin: Secondary | ICD-10-CM | POA: Diagnosis not present

## 2023-10-19 DIAGNOSIS — D509 Iron deficiency anemia, unspecified: Secondary | ICD-10-CM | POA: Diagnosis not present

## 2023-10-19 DIAGNOSIS — Z992 Dependence on renal dialysis: Secondary | ICD-10-CM | POA: Diagnosis not present

## 2023-10-22 ENCOUNTER — Emergency Department: Payer: Medicare Other

## 2023-10-22 ENCOUNTER — Inpatient Hospital Stay (HOSPITAL_COMMUNITY)
Admit: 2023-10-22 | Discharge: 2023-10-22 | Disposition: A | Discharge status: Home / Self Care | Payer: Medicare Other | Attending: Family Medicine | Admitting: Family Medicine

## 2023-10-22 ENCOUNTER — Inpatient Hospital Stay
Admission: EM | Admit: 2023-10-22 | Discharge: 2023-10-26 | DRG: 286 | Disposition: A | Discharge status: Home / Self Care | Payer: Medicare Other | Attending: Hospitalist | Admitting: Hospitalist

## 2023-10-22 ENCOUNTER — Other Ambulatory Visit: Payer: Self-pay

## 2023-10-22 ENCOUNTER — Encounter: Payer: Self-pay | Admitting: Family Medicine

## 2023-10-22 DIAGNOSIS — Z8673 Personal history of transient ischemic attack (TIA), and cerebral infarction without residual deficits: Secondary | ICD-10-CM | POA: Diagnosis not present

## 2023-10-22 DIAGNOSIS — Z7982 Long term (current) use of aspirin: Secondary | ICD-10-CM | POA: Diagnosis not present

## 2023-10-22 DIAGNOSIS — Z841 Family history of disorders of kidney and ureter: Secondary | ICD-10-CM | POA: Diagnosis not present

## 2023-10-22 DIAGNOSIS — R918 Other nonspecific abnormal finding of lung field: Secondary | ICD-10-CM | POA: Diagnosis not present

## 2023-10-22 DIAGNOSIS — K219 Gastro-esophageal reflux disease without esophagitis: Secondary | ICD-10-CM | POA: Diagnosis present

## 2023-10-22 DIAGNOSIS — Z90710 Acquired absence of both cervix and uterus: Secondary | ICD-10-CM | POA: Diagnosis not present

## 2023-10-22 DIAGNOSIS — J9601 Acute respiratory failure with hypoxia: Secondary | ICD-10-CM

## 2023-10-22 DIAGNOSIS — J810 Acute pulmonary edema: Secondary | ICD-10-CM | POA: Diagnosis not present

## 2023-10-22 DIAGNOSIS — R0603 Acute respiratory distress: Secondary | ICD-10-CM | POA: Diagnosis not present

## 2023-10-22 DIAGNOSIS — I311 Chronic constrictive pericarditis: Secondary | ICD-10-CM | POA: Diagnosis not present

## 2023-10-22 DIAGNOSIS — Z79899 Other long term (current) drug therapy: Secondary | ICD-10-CM | POA: Diagnosis not present

## 2023-10-22 DIAGNOSIS — E1169 Type 2 diabetes mellitus with other specified complication: Secondary | ICD-10-CM

## 2023-10-22 DIAGNOSIS — Z87891 Personal history of nicotine dependence: Secondary | ICD-10-CM | POA: Diagnosis not present

## 2023-10-22 DIAGNOSIS — I639 Cerebral infarction, unspecified: Secondary | ICD-10-CM | POA: Diagnosis present

## 2023-10-22 DIAGNOSIS — N2581 Secondary hyperparathyroidism of renal origin: Secondary | ICD-10-CM | POA: Diagnosis present

## 2023-10-22 DIAGNOSIS — E1122 Type 2 diabetes mellitus with diabetic chronic kidney disease: Secondary | ICD-10-CM | POA: Diagnosis present

## 2023-10-22 DIAGNOSIS — Z992 Dependence on renal dialysis: Secondary | ICD-10-CM | POA: Diagnosis not present

## 2023-10-22 DIAGNOSIS — I132 Hypertensive heart and chronic kidney disease with heart failure and with stage 5 chronic kidney disease, or end stage renal disease: Principal | ICD-10-CM | POA: Diagnosis present

## 2023-10-22 DIAGNOSIS — E785 Hyperlipidemia, unspecified: Secondary | ICD-10-CM | POA: Diagnosis present

## 2023-10-22 DIAGNOSIS — I10 Essential (primary) hypertension: Secondary | ICD-10-CM | POA: Diagnosis not present

## 2023-10-22 DIAGNOSIS — R5381 Other malaise: Secondary | ICD-10-CM | POA: Diagnosis present

## 2023-10-22 DIAGNOSIS — I517 Cardiomegaly: Secondary | ICD-10-CM | POA: Diagnosis not present

## 2023-10-22 DIAGNOSIS — I272 Pulmonary hypertension, unspecified: Secondary | ICD-10-CM | POA: Diagnosis present

## 2023-10-22 DIAGNOSIS — N186 End stage renal disease: Secondary | ICD-10-CM | POA: Diagnosis present

## 2023-10-22 DIAGNOSIS — I120 Hypertensive chronic kidney disease with stage 5 chronic kidney disease or end stage renal disease: Secondary | ICD-10-CM | POA: Diagnosis not present

## 2023-10-22 DIAGNOSIS — E8770 Fluid overload, unspecified: Secondary | ICD-10-CM | POA: Diagnosis not present

## 2023-10-22 DIAGNOSIS — I3139 Other pericardial effusion (noninflammatory): Secondary | ICD-10-CM | POA: Diagnosis not present

## 2023-10-22 DIAGNOSIS — J9600 Acute respiratory failure, unspecified whether with hypoxia or hypercapnia: Secondary | ICD-10-CM | POA: Diagnosis not present

## 2023-10-22 DIAGNOSIS — D631 Anemia in chronic kidney disease: Secondary | ICD-10-CM | POA: Diagnosis not present

## 2023-10-22 DIAGNOSIS — I5041 Acute combined systolic (congestive) and diastolic (congestive) heart failure: Secondary | ICD-10-CM | POA: Diagnosis present

## 2023-10-22 DIAGNOSIS — I493 Ventricular premature depolarization: Secondary | ICD-10-CM | POA: Diagnosis present

## 2023-10-22 DIAGNOSIS — E1129 Type 2 diabetes mellitus with other diabetic kidney complication: Secondary | ICD-10-CM | POA: Diagnosis present

## 2023-10-22 DIAGNOSIS — J90 Pleural effusion, not elsewhere classified: Secondary | ICD-10-CM | POA: Diagnosis not present

## 2023-10-22 DIAGNOSIS — I5021 Acute systolic (congestive) heart failure: Secondary | ICD-10-CM | POA: Diagnosis not present

## 2023-10-22 DIAGNOSIS — I5023 Acute on chronic systolic (congestive) heart failure: Secondary | ICD-10-CM | POA: Diagnosis not present

## 2023-10-22 LAB — CBC
HCT: 33.9 % — ABNORMAL LOW (ref 36.0–46.0)
Hemoglobin: 10.7 g/dL — ABNORMAL LOW (ref 12.0–15.0)
MCH: 28.5 pg (ref 26.0–34.0)
MCHC: 31.6 g/dL (ref 30.0–36.0)
MCV: 90.4 fL (ref 80.0–100.0)
Platelets: 204 10*3/uL (ref 150–400)
RBC: 3.75 MIL/uL — ABNORMAL LOW (ref 3.87–5.11)
RDW: 16.5 % — ABNORMAL HIGH (ref 11.5–15.5)
WBC: 9.8 10*3/uL (ref 4.0–10.5)
nRBC: 0 % (ref 0.0–0.2)

## 2023-10-22 LAB — BLOOD GAS, VENOUS
Acid-Base Excess: 5 mmol/L — ABNORMAL HIGH (ref 0.0–2.0)
Bicarbonate: 31.8 mmol/L — ABNORMAL HIGH (ref 20.0–28.0)
Delivery systems: POSITIVE
FIO2: 60 %
O2 Saturation: 76.4 %
Patient temperature: 37
pCO2, Ven: 55 mm[Hg] (ref 44–60)
pH, Ven: 7.37 (ref 7.25–7.43)
pO2, Ven: 49 mm[Hg] — ABNORMAL HIGH (ref 32–45)

## 2023-10-22 LAB — CBG MONITORING, ED
Glucose-Capillary: 70 mg/dL (ref 70–99)
Glucose-Capillary: 118 mg/dL — ABNORMAL HIGH (ref 70–99)

## 2023-10-22 LAB — BASIC METABOLIC PANEL
Anion gap: 18 — ABNORMAL HIGH (ref 5–15)
BUN: 53 mg/dL — ABNORMAL HIGH (ref 8–23)
CO2: 27 mmol/L (ref 22–32)
Calcium: 9.1 mg/dL (ref 8.9–10.3)
Chloride: 95 mmol/L — ABNORMAL LOW (ref 98–111)
Creatinine, Ser: 7.1 mg/dL — ABNORMAL HIGH (ref 0.44–1.00)
GFR, Estimated: 6 mL/min — ABNORMAL LOW (ref >60)
Glucose, Bld: 143 mg/dL — ABNORMAL HIGH (ref 70–99)
Potassium: 4.1 mmol/L (ref 3.5–5.1)
Sodium: 140 mmol/L (ref 135–145)

## 2023-10-22 LAB — BRAIN NATRIURETIC PEPTIDE: B Natriuretic Peptide: >4500 pg/mL — ABNORMAL HIGH (ref 0.0–100.0)

## 2023-10-22 LAB — HEMOGLOBIN A1C
Hgb A1c MFr Bld: 4.6 % — ABNORMAL LOW (ref 4.8–5.6)
Mean Plasma Glucose: 85.32 mg/dL

## 2023-10-22 LAB — ECHOCARDIOGRAM COMPLETE
AR max vel: 2.1 cm2
AV Area VTI: 1.95 cm2
AV Area mean vel: 1.91 cm2
AV Mean grad: 6 mm[Hg]
AV Peak grad: 14.4 mm[Hg]
Ao pk vel: 1.9 m/s
Area-P 1/2: 3.7 cm2
Calc EF: 48 %
Height: 63 in
MV M vel: 5.88 m/s
MV Peak grad: 138.3 mm[Hg]
MV VTI: 3.36 cm2
P 1/2 time: 519 ms
Radius: 0.6 cm
S' Lateral: 4.9 cm
Single Plane A2C EF: 55.8 %
Single Plane A4C EF: 38.7 %

## 2023-10-22 LAB — TROPONIN I (HIGH SENSITIVITY)
Troponin I (High Sensitivity): 31 ng/L — ABNORMAL HIGH (ref <18)
Troponin I (High Sensitivity): 46 ng/L — ABNORMAL HIGH (ref <18)

## 2023-10-22 LAB — MRSA NEXT GEN BY PCR, NASAL: MRSA by PCR Next Gen: NOT DETECTED

## 2023-10-22 LAB — HEPATITIS B SURFACE ANTIGEN: Hepatitis B Surface Ag: NONREACTIVE

## 2023-10-22 LAB — LACTIC ACID, PLASMA
Lactic Acid, Venous: 1.1 mmol/L (ref 0.5–1.9)
Lactic Acid, Venous: 0.5 mmol/L (ref 0.5–1.9)

## 2023-10-22 LAB — POTASSIUM: Potassium: 3.9 mmol/L (ref 3.5–5.1)

## 2023-10-22 MED ORDER — HYDRALAZINE HCL 50 MG PO TABS
50.0000 mg | ORAL_TABLET | Freq: Three times a day (TID) | ORAL | Status: DC
Start: 2023-10-22 — End: 2023-10-22

## 2023-10-22 MED ORDER — HEPARIN SODIUM (PORCINE) 1000 UNIT/ML DIALYSIS: 20.0000 [IU]/kg | INTRAMUSCULAR | Status: DC | PRN

## 2023-10-22 MED ORDER — SEVELAMER CARBONATE 800 MG PO TABS
1600.0000 mg | ORAL_TABLET | Freq: Three times a day (TID) | ORAL | Status: DC
  Administered 2023-10-22 – 2023-10-26 (×10): 1600 mg via ORAL
  Filled 2023-10-22 (×11): qty 2

## 2023-10-22 MED ORDER — ALTEPLASE 2 MG IJ SOLR
2.0000 mg | Freq: Once | INTRAMUSCULAR | Status: DC | PRN
Start: 2023-10-22 — End: 2023-10-24

## 2023-10-22 MED ORDER — HEPARIN SODIUM (PORCINE) 5000 UNIT/ML IJ SOLN
5000.0000 [IU] | Freq: Three times a day (TID) | INTRAMUSCULAR | Status: DC
  Administered 2023-10-22 – 2023-10-26 (×9): 5000 [IU] via SUBCUTANEOUS
  Filled 2023-10-22 (×10): qty 1

## 2023-10-22 MED ORDER — CALCIUM GLUCONATE 10 % IV SOLN
1.0000 g | Freq: Once | INTRAVENOUS | Status: AC
  Administered 2023-10-22: 1 g via INTRAVENOUS
  Filled 2023-10-22: qty 10

## 2023-10-22 MED ORDER — CARVEDILOL 25 MG PO TABS
25.0000 mg | ORAL_TABLET | Freq: Two times a day (BID) | ORAL | Status: DC
  Administered 2023-10-22 – 2023-10-26 (×7): 25 mg via ORAL
  Filled 2023-10-22 (×7): qty 1

## 2023-10-22 MED ORDER — HYDRALAZINE HCL 50 MG PO TABS
50.0000 mg | ORAL_TABLET | Freq: Three times a day (TID) | ORAL | Status: DC | PRN
Start: 2023-10-22 — End: 2023-10-23

## 2023-10-22 MED ORDER — PENTAFLUOROPROP-TETRAFLUOROETH EX AERO
1.0000 | INHALATION_SPRAY | CUTANEOUS | Status: DC | PRN
Start: 2023-10-22 — End: 2023-10-24
  Administered 2023-10-22: 1 via TOPICAL
  Filled 2023-10-22: qty 30

## 2023-10-22 MED ORDER — LIDOCAINE-PRILOCAINE 2.5-2.5 % EX CREA: 1.0000 | TOPICAL_CREAM | CUTANEOUS | Status: DC | PRN

## 2023-10-22 MED ORDER — PENTAFLUOROPROP-TETRAFLUOROETH EX AERO
INHALATION_SPRAY | CUTANEOUS | Status: AC
  Filled 2023-10-22: qty 30

## 2023-10-22 MED ORDER — ONDANSETRON HCL 4 MG PO TABS: 4.0000 mg | ORAL_TABLET | Freq: Four times a day (QID) | ORAL | Status: DC | PRN

## 2023-10-22 MED ORDER — LIDOCAINE HCL (PF) 1 % IJ SOLN
5.0000 mL | INTRAMUSCULAR | Status: DC | PRN
Start: 2023-10-22 — End: 2023-10-24

## 2023-10-22 MED ORDER — HEPARIN SODIUM (PORCINE) 1000 UNIT/ML DIALYSIS: 1000.0000 [IU] | INTRAMUSCULAR | Status: DC | PRN

## 2023-10-22 MED ORDER — ONDANSETRON HCL 4 MG/2ML IJ SOLN: 4.0000 mg | Freq: Four times a day (QID) | INTRAMUSCULAR | Status: DC | PRN

## 2023-10-22 MED ORDER — NITROGLYCERIN IN D5W 200-5 MCG/ML-% IV SOLN
0.0000 ug/min | INTRAVENOUS | Status: DC
Start: 2023-10-22 — End: 2023-10-23
  Administered 2023-10-22: 5 ug/min via INTRAVENOUS
  Filled 2023-10-22: qty 250

## 2023-10-22 MED ORDER — DEXTROSE 5 % IV SOLN
120.0000 mg | Freq: Once | INTRAVENOUS | Status: AC
  Administered 2023-10-22: 120 mg via INTRAVENOUS
  Filled 2023-10-22: qty 12

## 2023-10-22 MED ORDER — CINACALCET HCL 30 MG PO TABS
120.0000 mg | ORAL_TABLET | Freq: Every day | ORAL | Status: DC
  Administered 2023-10-22 – 2023-10-25 (×4): 120 mg via ORAL
  Filled 2023-10-22 (×6): qty 4

## 2023-10-22 MED ORDER — NITROGLYCERIN 2 % TD OINT
1.0000 [in_us] | TOPICAL_OINTMENT | Freq: Once | TRANSDERMAL | Status: AC
  Administered 2023-10-22: 1 [in_us] via TOPICAL
  Filled 2023-10-22: qty 1

## 2023-10-22 MED ORDER — CHLORHEXIDINE GLUCONATE CLOTH 2 % EX PADS
6.0000 | MEDICATED_PAD | Freq: Every day | CUTANEOUS | Status: DC
  Administered 2023-10-23 – 2023-10-25 (×3): 6 via TOPICAL
  Filled 2023-10-22 (×2): qty 6

## 2023-10-22 MED ORDER — NEPRO/CARBSTEADY PO LIQD
237.0000 mL | ORAL | Status: DC | PRN
Start: 2023-10-22 — End: 2023-10-24

## 2023-10-22 MED ORDER — INSULIN ASPART 100 UNIT/ML IJ SOLN: 0.0000 [IU] | Freq: Three times a day (TID) | INTRAMUSCULAR | Status: DC

## 2023-10-22 MED ORDER — ROSUVASTATIN CALCIUM 5 MG PO TABS
5.0000 mg | ORAL_TABLET | ORAL | Status: DC
  Administered 2023-10-23: 5 mg via ORAL
  Filled 2023-10-22: qty 1

## 2023-10-22 MED ORDER — ANTICOAGULANT SODIUM CITRATE 4% (200MG/5ML) IV SOLN: 5.0000 mL | Status: DC | PRN

## 2023-10-22 MED ORDER — FUROSEMIDE 40 MG PO TABS
80.0000 mg | ORAL_TABLET | ORAL | Status: DC
  Administered 2023-10-23: 80 mg via ORAL
  Filled 2023-10-22: qty 2

## 2023-10-22 NOTE — Assessment & Plan Note (Signed)
Hgb 10.7 today  Near baseline  Monitor

## 2023-10-22 NOTE — Progress Notes (Signed)
[facility]  ROUNDING NOTE   Subjective:   Patient well-known to [place] from prior admissions. Normally goes to [facility]. Last treatment prior to admission was 10/19/2023. Now comes in with significant shortness of breath. Currently on BiPAP.    Objective:  Vital signs in last 24 hours:  Temp:  [97.5 F (36.4 C)-98 F (36.7 C)] 98 F (36.7 C) (11/18 1120) Pulse Rate:  [71-94] 73 (11/18 1150) Resp:  [12-27] 12 (11/18 1150) BP: (154-180)/(83-90) 172/86 (11/18 1150) SpO2:  [60 %-100 %] 100 % (11/18 1150) FiO2 (%):  [60 %] 60 % (11/18 0816)  Weight change:  There were no vitals filed for this visit.  Intake/Output: No intake/output data recorded.   Intake/Output this shift:  Total I/O In: 61.2 [IV Piggyback:61.2] Out: -   Physical Exam: General: Mild respiratory distress  Head: Normocephalic, atraumatic. Moist oral mucosal membranes  Neck: Supple  Lungs:  Bilateral rales, on BiPAP  Heart: S1S2 no rubs  Abdomen:  Soft, nontender, bowel sounds present  Extremities: Trace peripheral edema.  Neurologic: Awake, alert, following commands  Skin: No acute rash  Access: Left upper extremity AV fistula    Basic Metabolic Panel: Recent Labs  Lab 10/22/23 0600 10/22/23 0955  NA 140  --   K 4.1 3.9  CL 95*  --   CO2 27  --   GLUCOSE 143*  --   BUN 53*  --   CREATININE 7.10*  --   CALCIUM 9.1  --     Liver Function Tests: No results for input(s): "AST", "ALT", "ALKPHOS", "BILITOT", "PROT", "ALBUMIN" in the last 168 hours. No results for input(s): "LIPASE", "AMYLASE" in the last 168 hours. No results for input(s): "AMMONIA" in the last 168 hours.  CBC: Recent Labs  Lab 10/22/23 0600  WBC 9.8  HGB 10.7*  HCT 33.9*  MCV 90.4  PLT 204    Cardiac Enzymes: No results for input(s): "CKTOTAL", "CKMB", "CKMBINDEX", "TROPONINI" in the last 168 hours.  BNP: Invalid input(s): "POCBNP"  CBG: No results for input(s): "GLUCAP"  in the last 168 hours.  Microbiology: Results for orders placed or performed in visit on 05/24/23  Microscopic Examination     Status: Abnormal   Collection Time: 05/24/23  3:42 PM   Urine  Result Value Ref Range Status   WBC, UA 0-5 0 - 5 /hpf Final   RBC, Urine None seen 0 - 2 /hpf Final   Epithelial Cells (non renal) >10 (A) 0 - 10 /hpf Final   Casts None seen None seen /lpf Final   Bacteria, UA Few None seen/Few Final    Coagulation Studies: No results for input(s): "LABPROT", "INR" in the last 72 hours.  Urinalysis: No results for input(s): "COLORURINE", "LABSPEC", "PHURINE", "GLUCOSEU", "HGBUR", "BILIRUBINUR", "KETONESUR", "PROTEINUR", "UROBILINOGEN", "NITRITE", "LEUKOCYTESUR" in the last 72 hours.  Invalid input(s): "APPERANCEUR"    Imaging: DG Chest Portable 1 View  Result Date: 10/22/2023 CLINICAL DATA:  Respiratory distress. EXAM: PORTABLE CHEST 1 VIEW COMPARISON:  08/03/2023 FINDINGS: Cardiac enlargement. Aortic atherosclerosis. Small left pleural effusion is suspected. Diffuse increase interstitial markings identified bilaterally with bilateral patchy airspace opacities. There is advanced degenerative joint disease involving the glenohumeral and acromioclavicular joints. IMPRESSION: Cardiac enlargement, small left pleural effusion, increased interstitial markings and bilateral patchy airspace opacities compatible with moderate pulmonary edema. Electronically Signed   By: Signa Kell M.D.   On: 10/22/2023 07:00     Medications:    anticoagulant sodium citrate  nitroGLYCERIN 10 mcg/min (10/22/23 0755)    Chlorhexidine Gluconate Cloth  6 each Topical Q0600   heparin  5,000 Units Subcutaneous Q8H   insulin aspart  0-6 Units Subcutaneous TID WC   alteplase, anticoagulant sodium citrate, feeding supplement (NEPRO CARB STEADY), heparin, heparin, lidocaine (PF), lidocaine-prilocaine, ondansetron **OR** ondansetron (ZOFRAN) IV,  pentafluoroprop-tetrafluoroeth  Assessment/ Plan:  77 y.o. female with past medical history of ESRD on HD, hypertension, hyperlipidemia, CVA, macular degeneration who was admitted for acute respiratory failure requiring BiPAP.  [facility] nephrology/MWF/[facility]/left upper extremity AV fistula  1.  ESRD on HD.  Patient in respiratory distress.  Therefore she was placed on urgent dialysis this a.m.  Reassess volume status in the morning after dialysis.  2.  Acute respiratory failure.  Patient was requiring BiPAP earlier in the day.  She was placed on dialysis urgently for volume removal.  3.  Anemia of chronic kidney disease.  Hemoglobin currently 10.7.  Hold off on Epogen for now.  4.  Secondary hyperparathyroidism.  Monitor bone metabolism parameters over the course of the hospitalization.   LOS: 0 Kristen Francis 11/18/202411:54 AM

## 2023-10-22 NOTE — Progress Notes (Signed)
Potassium 3.9; changed acid from 3k+ to 2k+ per dr order.

## 2023-10-22 NOTE — Assessment & Plan Note (Signed)
PPI ?

## 2023-10-22 NOTE — Progress Notes (Signed)
Received patient in ER at bedside.    Informed consent signed and in chart.    TX duration: 3.5 hrs   Hand-off given to patient's nurse.   Access used:  lt avf Access issues: n/a  Total UF removed: 3000 mls        Maple Hudson, RN Dialysis Unit

## 2023-10-22 NOTE — Assessment & Plan Note (Signed)
Baseline ESRD on HD MWF- due for HD today  Acute SOB, DOE since 2 am  Denies any missed HD episodes  Does admit to high salt intake  HD at the bedside  Follow up nephrology recommendations

## 2023-10-22 NOTE — Assessment & Plan Note (Addendum)
Decompensated resp status now on BIPAP in setting of volume overload and baseline ESRD  Pt denies any missed HD  + high sodium intake likely confounder  CXR w/ changes consistent with pulmonary edema  Pending bedside HD  No overt signs of infection at present  Cont w/ BIPAP  Anticipate transition to RA vs low supplemental O2 s/p HD  Monitor

## 2023-10-22 NOTE — ED Provider Notes (Signed)
[facility] Provider Note    Event Date/Time   First MD Initiated Contact with Patient 10/22/23 [phone]     (approximate)   History   Shortness of Breath   HPI  Kristen Francis is a 77 y.o. female   Past medical history of end-stage renal disease on hemodialysis Monday Wednesday Friday, who presents to Emergency Department with respiratory distress starting this morning.  She was on her way to dialysis.  She did not miss her last session which was on Friday.  She denies any respiratory infectious symptoms preceding, and has no chest pain.  Independent Historian contributed to assessment above: Her husband is at bedside to corroborate information past medical history as above       Physical Exam   Triage Vital Signs: ED Triage Vitals  Encounter Vitals Group     BP --      Systolic BP Percentile --      Diastolic BP Percentile --      Pulse Rate 10/22/23 0552 94     Resp 10/22/23 0552 (!) 26     Temp --      Temp src --      SpO2 10/22/23 0550 (!) 60 %     Weight --      Height --      Head Circumference --      Peak Flow --      Pain Score 10/22/23 0547 10     Pain Loc --      Pain Education --      Exclude from Growth Chart --     Most recent vital signs: Vitals:   10/22/23 0816 10/22/23 0820  BP:  (!) 178/88  Pulse: 77 73  Resp: (!) 24 (!) 26  Temp:    SpO2: 100% 100%    General: Awake, no distress.  CV:  Good peripheral perfusion.  Resp:  Normal effort.  Abd:  No distention.  Other:  Respiratory distress with O2 saturation 60%, Rales throughout, B-lines on ultrasound at bedside, plethoric IVC.  Speaking in short sentences.  Following commands.   ED Results / Procedures / Treatments   Labs (all labs ordered are listed, but only abnormal results are displayed) Labs Reviewed  BASIC METABOLIC PANEL - Abnormal; Notable for the following components:      Result Value   Chloride 95 (*)    Glucose, Bld 143 (*)    BUN 53  (*)    Creatinine, Ser 7.10 (*)    GFR, Estimated 6 (*)    Anion gap 18 (*)    All other components within normal limits  CBC - Abnormal; Notable for the following components:   RBC 3.75 (*)    Hemoglobin 10.7 (*)    HCT 33.9 (*)    RDW 16.5 (*)    All other components within normal limits  BLOOD GAS, VENOUS - Abnormal; Notable for the following components:   pO2, Ven 49 (*)    Bicarbonate 31.8 (*)    Acid-Base Excess 5.0 (*)    All other components within normal limits  BRAIN NATRIURETIC PEPTIDE - Abnormal; Notable for the following components:   B Natriuretic Peptide >4,500.0 (*)    All other components within normal limits  TROPONIN I (HIGH SENSITIVITY) - Abnormal; Notable for the following components:   Troponin I (High Sensitivity) 31 (*)    All other components within normal limits  LACTIC ACID, PLASMA  LACTIC ACID, PLASMA  HEPATITIS B SURFACE ANTIGEN  HEPATITIS B SURFACE ANTIBODY, QUANTITATIVE  HEMOGLOBIN A1C  TROPONIN I (HIGH SENSITIVITY)     I ordered and reviewed the above labs they are notable for BNP is greater than 4500, initial troponin is 31, normal pH on blood gas  EKG  ED ECG REPORT I, Pilar Jarvis, the attending physician, personally viewed and interpreted this ECG.   Date: 10/22/2023  EKG Time: 0602  Rate: 93  Rhythm: sinus  Axis: nl  Intervals: frequent pvc  ST&T Change: no stemi    RADIOLOGY I independently reviewed and interpreted chest x-ray and I see diffuse opacities that look like pulmonary edema I also reviewed radiologist's formal read.   PROCEDURES:  Critical Care performed: Yes, see critical care procedure note(s)  .Critical Care  Performed by: Pilar Jarvis, MD Authorized by: Pilar Jarvis, MD   Critical care provider statement:    Critical care time (minutes):  30   Critical care was time spent personally by me on the following activities:  Development of treatment plan with patient or surrogate, discussions with consultants,  evaluation of patient's response to treatment, examination of patient, ordering and review of laboratory studies, ordering and review of radiographic studies, ordering and performing treatments and interventions, pulse oximetry, re-evaluation of patient's condition and review of old charts    MEDICATIONS ORDERED IN ED: Medications  Chlorhexidine Gluconate Cloth 2 % PADS 6 each (has no administration in time range)  pentafluoroprop-tetrafluoroeth (GEBAUERS) aerosol 1 Application (1 Application Topical Given 10/22/23 0817)  lidocaine (PF) (XYLOCAINE) 1 % injection 5 mL (has no administration in time range)  lidocaine-prilocaine (EMLA) cream 1 Application (has no administration in time range)  feeding supplement (NEPRO CARB STEADY) liquid 237 mL (has no administration in time range)  heparin injection 1,000 Units (has no administration in time range)  anticoagulant sodium citrate solution 5 mL (has no administration in time range)  alteplase (CATHFLO ACTIVASE) injection 2 mg (has no administration in time range)  heparin injection 1,000 Units (has no administration in time range)  furosemide (LASIX) 120 mg in dextrose 5 % 50 mL IVPB (120 mg Intravenous New Bag/Given 10/22/23 0754)  nitroGLYCERIN 50 mg in dextrose 5 % 250 mL (0.2 mg/mL) infusion (10 mcg/min Intravenous Rate/Dose Change 10/22/23 0755)  heparin injection 5,000 Units (has no administration in time range)  ondansetron (ZOFRAN) tablet 4 mg (has no administration in time range)    Or  ondansetron (ZOFRAN) injection 4 mg (has no administration in time range)  insulin aspart (novoLOG) injection 0-6 Units (has no administration in time range)  calcium gluconate inj 10% (1 g) URGENT USE ONLY! (1 g Intravenous Given 10/22/23 0608)  nitroGLYCERIN (NITROGLYN) 2 % ointment 1 inch (1 inch Topical Given 10/22/23 8469)    External physician / consultants:  I spoke with nephrologist regarding emergent hemodialysis  IMPRESSION / MDM /  ASSESSMENT AND PLAN / ED COURSE  I reviewed the triage vital signs and the nursing notes.                                Patient's presentation is most consistent with acute presentation with potential threat to life or bodily function.  Differential diagnosis includes, but is not limited to, flash pulmonary edema, CHF exacerbation, fluid overload   The patient is on the cardiac monitor to evaluate for evidence of arrhythmia and/or significant heart rate changes.  MDM:    Acute onset respiratory  distress with evidence of pulmonary edema in this patient who is end-stage renal on dialysis, with frequent PVCs and QRS widening concerning for hyperkalemia so I gave her some calcium gluconate prior to lab results, also started on BiPAP immediately, given IV Lasix, nitroglycerin, and immediately consulted with nephrology for dialysis.  Admission.       FINAL CLINICAL IMPRESSION(S) / ED DIAGNOSES   Final diagnoses:  Respiratory distress  Acute pulmonary edema (HCC)  Acute hypoxemic respiratory failure (HCC)  ESRD (end stage renal disease) on dialysis [facility])     Rx / DC Orders   ED Discharge Orders     None        Note:  This document was prepared using Dragon voice recognition software and may include unintentional dictation errors.    Pilar Jarvis, MD 10/22/23 [phone]

## 2023-10-22 NOTE — Assessment & Plan Note (Signed)
+   remote hx/o CVA  Cont ASA and statin

## 2023-10-22 NOTE — H&P (Signed)
History and Physical    Patient: Kristen Francis VWU:[id number] DOB: [birth year] DOA: 10/22/2023 DOS: the patient was seen and examined on 10/22/2023 PCP: Sallyanne Kuster, NP  Patient coming from: Home  Chief Complaint:  Chief Complaint  Patient presents with   Shortness of Breath   HPI: Kristen Francis is a 77 y.o. female with medical history significant of ESRD, HTN, CVA, GERD presenting with acute respiratory failure with hypoxia, volume overload.  Patient reports acute onset of shortness of breath around 2 AM.  Worsening orthopnea, PND.  Baseline ESRD on hemodialysis Monday Wednesday Friday.  Denies any missed episodes of dialysis.  Was due for dialysis this morning prior to onset of symptoms.  Denies any excessive fluid intake.  Does admit to high sodium intake.  No chest pain.  No abdominal pain.  No nausea or vomiting.  No reported tobacco use.  No fevers or chills.  No reported sick contacts. Presented to the ER afebrile, hemodynamically stable.  Initially satting 60% on room air.  Transition to BiPAP to help with respiratory support.  White count 9.8, hemoglobin 10.7, platelets 204, troponin 30s, BNP greater than 4500, creatinine 7.1.  Chest x-ray with cardiac enlargement small left pleural effusion and changes consistent with moderate pulmonary edema. Review of Systems: As mentioned in the history of present illness. All other systems reviewed and are negative. Past Medical History:  Diagnosis Date   Anemia    Arthritis    Chronic kidney disease    ESRD   Complication of anesthesia    had procedure and felt incision early 80s   Gout    Hyperlipemia    Hypertension    Macular degeneration, right eye    Stroke [facility])    Past Surgical History:  Procedure Laterality Date   A/V FISTULAGRAM Left 12/25/2018   Procedure: A/V FISTULAGRAM;  Surgeon: Renford Dills, MD;  Location: [facility] INVASIVE CV LAB;  Service: Cardiovascular;  Laterality: Left;   A/V FISTULAGRAM Left  10/09/2023   Procedure: A/V Fistulagram;  Surgeon: Renford Dills, MD;  Location: [facility] INVASIVE CV LAB;  Service: Cardiovascular;  Laterality: Left;   ABDOMINAL HYSTERECTOMY     APPENDECTOMY     AV FISTULA PLACEMENT Left 07/12/2018   Procedure: INSERTION OF ARTERIOVENOUS (AV) GORE-TEX GRAFT ARM ( BRACHIAL AXILLARY );  Surgeon: Renford Dills, MD;  Location: [facility] ORS;  Service: Vascular;  Laterality: Left;   COLONOSCOPY WITH PROPOFOL N/A 01/14/2016   Procedure: COLONOSCOPY WITH PROPOFOL;  Surgeon: Scot Jun, MD;  Location[facility] ENDOSCOPY;  Service: Endoscopy;  Laterality: N/A;   COLONOSCOPY WITH PROPOFOL N/A 02/14/2022   Procedure: COLONOSCOPY WITH PROPOFOL;  Surgeon: Regis Bill, MD;  Location: [facility] ENDOSCOPY;  Service: Endoscopy;  Laterality: N/A;   TONSILLECTOMY     Social History:  reports that she has quit smoking. She has never used smokeless tobacco. She reports that she does not drink alcohol and does not use drugs.  Allergies  Allergen Reactions   Other Other (See Comments)    Pt does not receive Blood    Penicillins Hives    Has patient had a PCN reaction causing immediate rash, facial/tongue/throat swelling, SOB or lightheadedness with hypotension: YES Has patient had a PCN reaction causing severe rash involving mucus membranes or skin necrosis: no Has patient had a PCN reaction that required hospitalization: no Has patient had a PCN reaction occurring within the last 10 years: no If all of the above answers are "NO", then may proceed  with Cephalosporin use.    Sodium Bicarbonate Itching    Family History  Problem Relation Age of Onset   Kidney disease Mother    Breast cancer Neg Hx     Prior to Admission medications   Medication Sig Start Date End Date Taking? Authorizing Provider  amLODipine (NORVASC) 10 MG tablet TAKE 1 TABLET(10 MG) BY MOUTH DAILY 08/13/23  Yes Abernathy, Alyssa, NP  B Complex-C-Folic Acid (RENAL-VITE) 0.8 MG TABS Take 0.8 mg by  mouth every evening. On dialysis days 11/22/18  Yes [provider]  carvedilol (COREG) 25 MG tablet TAKE 1 TABLET(25 MG) BY MOUTH TWICE DAILY WITH A MEAL 05/04/23  Yes Abernathy, Alyssa, NP  cholecalciferol (VITAMIN D) 1000 units tablet Take 1,000 Units by mouth daily.    Yes [provider]  cinacalcet (SENSIPAR) 60 MG tablet Take 120 mg by mouth daily with supper.   Yes [provider]  ferrous sulfate 325 (65 FE) MG tablet Take 1 tablet (325 mg total) by mouth 2 (two) times daily with a meal. 06/04/21  Yes Enedina Finner, MD  furosemide (LASIX) 80 MG tablet Take 1 tablet (80 mg total) by mouth 4 (four) times a week. On non-dialysis days 03/08/23  Yes Darlin Priestly, MD  hydrALAZINE (APRESOLINE) 50 MG tablet TAKE 1 TABLET(50 MG) BY MOUTH THREE TIMES DAILY. 10/17/23  Yes Abernathy, Arlyss Repress, NP  lidocaine-prilocaine (EMLA) cream Apply 1 Application topically as directed.   Yes [provider]  losartan (COZAAR) 50 MG tablet TAKE 1 TABLET(50 MG) BY MOUTH DAILY 08/13/23  Yes Abernathy, Alyssa, NP  omeprazole (PRILOSEC) 40 MG capsule TAKE 1 CAPSULE(40 MG) BY MOUTH DAILY 04/18/23  Yes Abernathy, Alyssa, NP  rosuvastatin (CRESTOR) 5 MG tablet Take 5 mg by mouth See admin instructions. Take 1 tablet (5mg ) by mouth on non-dialysis days   Yes [provider]  sennosides-docusate sodium (SENOKOT-S) 8.6-50 MG tablet Take 2 tablets by mouth daily.   Yes [provider]  sevelamer carbonate (RENVELA) 800 MG tablet Take 1,600 mg by mouth 3 (three) times daily with meals.   Yes [provider]  aspirin EC 81 MG EC tablet Take 1 tablet (81 mg total) by mouth daily. Swallow whole. 06/05/21   Enedina Finner, MD  benzonatate (TESSALON) 100 MG capsule Take 1 capsule (100 mg total) by mouth 2 (two) times daily as needed for cough. 12/11/22   Sallyanne Kuster, NP  dronabinol (MARINOL) 5 MG capsule Take 1 capsule by mouth before meals twice daily on non-dialysis days 10/18/23    Sallyanne Kuster, NP  HYDROcodone-acetaminophen (NORCO/VICODIN) 5-325 MG tablet Take 1 tablet by mouth every 4 (four) hours as needed for moderate pain. Patient not taking: Reported on 10/22/2023 04/18/23 04/17/24  Cuthriell, Delorise Royals, PA-C  metoCLOPramide (REGLAN) 5 MG tablet Take 1 tablet (5 mg total) by mouth 4 (four) times daily -  before meals and at bedtime. Patient not taking: Reported on 10/22/2023 09/06/22   Sallyanne Kuster, NP  predniSONE (DELTASONE) 50 MG tablet Take 1 tablet (50 mg total) by mouth daily with breakfast. Patient not taking: Reported on 10/22/2023 04/18/23   Racheal Patches, PA-C    Physical Exam: Vitals:   10/22/23 0550 10/22/23 0552 10/22/23 0715 10/22/23 0718  BP:   (!) 178/85   Pulse:  94 74   Resp:  (!) 26 (!) 22   Temp:    (!) 97.5 F (36.4 C)  TempSrc:    Axillary  SpO2: (!) 60% 95% 100%  Physical Exam Constitutional:      Comments: Underweight  On BIPAP    HENT:     Head: Normocephalic and atraumatic.     Nose: Nose normal.     Mouth/Throat:     Mouth: Mucous membranes are moist.  Eyes:     Pupils: Pupils are equal, round, and reactive to light.  Cardiovascular:     Rate and Rhythm: Normal rate and regular rhythm.  Pulmonary:     Comments: Mild increased WOB on BIPAP   Abdominal:     General: Bowel sounds are normal.  Musculoskeletal:        General: Normal range of motion.  Skin:    General: Skin is warm.  Neurological:     General: No focal deficit present.  Psychiatric:        Mood and Affect: Mood normal.     Data Reviewed:  There are no new results to review at this time.  DG Chest Portable 1 View CLINICAL DATA:  Respiratory distress.  EXAM: PORTABLE CHEST 1 VIEW  COMPARISON:  08/03/2023  FINDINGS: Cardiac enlargement. Aortic atherosclerosis. Small left pleural effusion is suspected. Diffuse increase interstitial markings identified bilaterally with bilateral patchy airspace opacities. There is advanced  degenerative joint disease involving the glenohumeral and acromioclavicular joints.  IMPRESSION: Cardiac enlargement, small left pleural effusion, increased interstitial markings and bilateral patchy airspace opacities compatible with moderate pulmonary edema.  Electronically Signed   By: Signa Kell M.D.   On: 10/22/2023 07:00  Lab Results  Component Value Date   WBC 9.8 10/22/2023   HGB 10.7 (L) 10/22/2023   HCT 33.9 (L) 10/22/2023   MCV 90.4 10/22/2023   PLT 204 10/22/2023   Last metabolic panel Lab Results  Component Value Date   GLUCOSE 143 (H) 10/22/2023   NA 140 10/22/2023   K 4.1 10/22/2023   CL 95 (L) 10/22/2023   CO2 27 10/22/2023   BUN 53 (H) 10/22/2023   CREATININE 7.10 (H) 10/22/2023   GFRNONAA 6 (L) 10/22/2023   CALCIUM 9.1 10/22/2023   PHOS 2.8 (L) 07/02/2020   PROT 6.4 (L) 08/03/2023   ALBUMIN 3.3 (L) 08/03/2023   LABGLOB 3.2 07/20/2020   AGRATIO 1.5 07/02/2020   BILITOT 0.5 08/03/2023   ALKPHOS 38 08/03/2023   AST 14 (L) 08/03/2023   ALT 10 08/03/2023   ANIONGAP 18 (H) 10/22/2023    Assessment and Plan: * Volume overload Positive volume overload in the setting of ESRD on hemodialysis Monday Wednesday Friday Noted pulmonary edema on chest x-ray BNP greater than 4500 No reported missed episodes of hemodialysis Suspect high sodium intake likely confounding issue Pending hemodialysis at the bedside Will also obtain 2D echo to assess cardiac function Monitor  Acute respiratory failure with hypoxia (HCC) Decompensated resp status now on BIPAP in setting of volume overload and baseline ESRD  Pt denies any missed HD  + high sodium intake likely confounder  CXR w/ changes consistent with pulmonary edema  Pending bedside HD  No overt signs of infection at present  Cont w/ BIPAP  Anticipate transition to RA vs low supplemental O2 s/p HD  Monitor    End stage renal disease (HCC) Baseline ESRD on HD MWF- due for HD today  Acute SOB, DOE  since 2 am  Denies any missed HD episodes  Does admit to high salt intake  HD at the bedside  Follow up nephrology recommendations    Essential hypertension BP stable  Titrate home regimen  Type 2 diabetes mellitus with other diabetic kidney complication (HCC) Blood sugar in 140s  Sensitive SSI  Monitor   Gastroesophageal reflux disease without esophagitis PPI  Stroke (HCC) + remote hx/o CVA  Cont ASA and statin    Anemia in chronic renal disease Hgb 10.7 today  Near baseline  Monitor        Advance Care Planning:   Code Status: Full Code   Consults: Nephrology   Family Communication: No family at the bedside   Severity of Illness: The appropriate patient status for this patient is INPATIENT. Inpatient status is judged to be reasonable and necessary in order to provide the required intensity of service to ensure the patient's safety. The patient's presenting symptoms, physical exam findings, and initial radiographic and laboratory data in the context of their chronic comorbidities is felt to place them at high risk for further clinical deterioration. Furthermore, it is not anticipated that the patient will be medically stable for discharge from the hospital within 2 midnights of admission.   * I certify that at the point of admission it is my clinical judgment that the patient will require inpatient hospital care spanning beyond 2 midnights from the point of admission due to high intensity of service, high risk for further deterioration and high frequency of surveillance required.*  Author: Floydene Flock, MD 10/22/2023 8:03 AM  For on call review [email or web address].

## 2023-10-22 NOTE — ED Triage Notes (Signed)
Pt arrives POV with SHOB. Pt reports she was on the way to dialysis and couldn't make it there due to her [facility]. Pt arrives 60% on RA and reports she has not missed any dialysis sessions. Pt placed on NRB in triage and oxygen increased to 95%

## 2023-10-22 NOTE — ED Notes (Signed)
Dialysis RN at bedside.

## 2023-10-22 NOTE — Assessment & Plan Note (Signed)
BP stable Titrate home regimen 

## 2023-10-22 NOTE — Assessment & Plan Note (Signed)
Positive volume overload in the setting of ESRD on hemodialysis Monday Wednesday Friday Noted pulmonary edema on chest x-ray BNP greater than 4500 No reported missed episodes of hemodialysis Suspect high sodium intake likely confounding issue Pending hemodialysis at the bedside Will also obtain 2D echo to assess cardiac function Monitor

## 2023-10-22 NOTE — Progress Notes (Signed)
*  PRELIMINARY RESULTS* Echocardiogram 2D Echocardiogram has been performed.  Carolyne Fiscal 10/22/2023, 3:27 PM

## 2023-10-22 NOTE — Assessment & Plan Note (Signed)
Blood sugar in 140s  Sensitive SSI  Monitor

## 2023-10-23 DIAGNOSIS — E8770 Fluid overload, unspecified: Secondary | ICD-10-CM | POA: Diagnosis not present

## 2023-10-23 LAB — CBC
HCT: 29 % — ABNORMAL LOW (ref 36.0–46.0)
Hemoglobin: 9.3 g/dL — ABNORMAL LOW (ref 12.0–15.0)
MCH: 28.2 pg (ref 26.0–34.0)
MCHC: 32.1 g/dL (ref 30.0–36.0)
MCV: 87.9 fL (ref 80.0–100.0)
Platelets: 198 10*3/uL (ref 150–400)
RBC: 3.3 MIL/uL — ABNORMAL LOW (ref 3.87–5.11)
RDW: 16.2 % — ABNORMAL HIGH (ref 11.5–15.5)
WBC: 4.3 10*3/uL (ref 4.0–10.5)
nRBC: 0 % (ref 0.0–0.2)

## 2023-10-23 LAB — COMPREHENSIVE METABOLIC PANEL
ALT: 8 U/L (ref 0–44)
AST: 13 U/L — ABNORMAL LOW (ref 15–41)
Albumin: 3.1 g/dL — ABNORMAL LOW (ref 3.5–5.0)
Alkaline Phosphatase: 32 U/L — ABNORMAL LOW (ref 38–126)
Anion gap: 10 (ref 5–15)
BUN: 48 mg/dL — ABNORMAL HIGH (ref 8–23)
CO2: 30 mmol/L (ref 22–32)
Calcium: 8.4 mg/dL — ABNORMAL LOW (ref 8.9–10.3)
Chloride: 94 mmol/L — ABNORMAL LOW (ref 98–111)
Creatinine, Ser: 6.48 mg/dL — ABNORMAL HIGH (ref 0.44–1.00)
GFR, Estimated: 6 mL/min — ABNORMAL LOW (ref >60)
Glucose, Bld: 108 mg/dL — ABNORMAL HIGH (ref 70–99)
Potassium: 3.9 mmol/L (ref 3.5–5.1)
Sodium: 134 mmol/L — ABNORMAL LOW (ref 135–145)
Total Bilirubin: 0.7 mg/dL (ref <1.2)
Total Protein: 5.9 g/dL — ABNORMAL LOW (ref 6.5–8.1)

## 2023-10-23 LAB — CBG MONITORING, ED
Glucose-Capillary: 107 mg/dL — ABNORMAL HIGH (ref 70–99)
Glucose-Capillary: 126 mg/dL — ABNORMAL HIGH (ref 70–99)
Glucose-Capillary: 92 mg/dL (ref 70–99)

## 2023-10-23 LAB — HEPATITIS B SURFACE ANTIBODY, QUANTITATIVE: Hep B S AB Quant (Post): 111 m[IU]/mL

## 2023-10-23 MED ORDER — ASPIRIN 81 MG PO TBEC
81.0000 mg | DELAYED_RELEASE_TABLET | Freq: Every day | ORAL | Status: DC
  Administered 2023-10-23 – 2023-10-26 (×3): 81 mg via ORAL
  Filled 2023-10-23 (×3): qty 1

## 2023-10-23 MED ORDER — AMLODIPINE BESYLATE 5 MG PO TABS
10.0000 mg | ORAL_TABLET | Freq: Every day | ORAL | Status: DC
  Administered 2023-10-23: 10 mg via ORAL
  Filled 2023-10-23: qty 2

## 2023-10-23 MED ORDER — HYDRALAZINE HCL 50 MG PO TABS
50.0000 mg | ORAL_TABLET | Freq: Three times a day (TID) | ORAL | Status: DC
  Administered 2023-10-23 – 2023-10-26 (×10): 50 mg via ORAL
  Filled 2023-10-23 (×10): qty 1

## 2023-10-23 MED ORDER — LOSARTAN POTASSIUM 50 MG PO TABS
50.0000 mg | ORAL_TABLET | Freq: Every day | ORAL | Status: DC
  Administered 2023-10-23 – 2023-10-24 (×2): 50 mg via ORAL
  Filled 2023-10-23 (×2): qty 1

## 2023-10-23 MED ORDER — ORAL CARE MOUTH RINSE: 15.0000 mL | OROMUCOSAL | Status: DC | PRN

## 2023-10-23 NOTE — Progress Notes (Signed)
[facility]  ROUNDING NOTE   Subjective:   Patient well-known to [place] from prior admissions. Normally goes to [facility]. Last treatment prior to admission was 10/19/2023.  Update:  Patient underwent hemodialysis treatment yesterday. Tolerated well. Now off of BiPAP.    Objective:  Vital signs in last 24 hours:  Temp:  [98 F (36.7 C)-98.5 F (36.9 C)] 98.4 F (36.9 C) (11/19 1631) Pulse Rate:  [63-83] 70 (11/19 1631) Resp:  [11-28] 16 (11/19 1631) BP: (140-194)/(67-161) 140/70 (11/19 1631) SpO2:  [94 %-100 %] 99 % (11/19 1631)  Weight change:  There were no vitals filed for this visit.  Intake/Output: I/O last 3 completed shifts: In: 61.2 [IV Piggyback:61.2] Out: 3000 [Other:3000]   Intake/Output this shift:  No intake/output data recorded.  Physical Exam: General: No acute distress  Head: Normocephalic, atraumatic. Moist oral mucosal membranes  Neck: Supple  Lungs:  B minimal basilar Rales  Heart: S1S2 no rubs  Abdomen:  Soft, nontender, bowel sounds present  Extremities: Trace peripheral edema.  Neurologic: Awake, alert, following commands  Skin: No acute rash  Access: Left upper extremity AV fistula    Basic Metabolic Panel: Recent Labs  Lab 10/22/23 0600 10/22/23 0955  NA 140  --   K 4.1 3.9  CL 95*  --   CO2 27  --   GLUCOSE 143*  --   BUN 53*  --   CREATININE 7.10*  --   CALCIUM 9.1  --     Liver Function Tests: No results for input(s): "AST", "ALT", "ALKPHOS", "BILITOT", "PROT", "ALBUMIN" in the last 168 hours. No results for input(s): "LIPASE", "AMYLASE" in the last 168 hours. No results for input(s): "AMMONIA" in the last 168 hours.  CBC: Recent Labs  Lab 10/22/23 0600  WBC 9.8  HGB 10.7*  HCT 33.9*  MCV 90.4  PLT 204    Cardiac Enzymes: No results for input(s): "CKTOTAL", "CKMB", "CKMBINDEX", "TROPONINI" in the last 168 hours.  BNP: Invalid input(s): "POCBNP"  CBG: Recent Labs  Lab  10/22/23 1158 10/22/23 1732 10/23/23 0058 10/23/23 0750 10/23/23 1152  GLUCAP 70 118* 92 126* 107*    Microbiology: Results for orders placed or performed during the hospital encounter of 10/22/23  MRSA Next Gen by PCR, Nasal     Status: None   Collection Time: 10/22/23  9:55 AM   Specimen: Nasal Mucosa; Nasal Swab  Result Value Ref Range Status   MRSA by PCR Next Gen NOT DETECTED NOT DETECTED Final    Comment: (NOTE) The GeneXpert MRSA Assay (FDA approved for NASAL specimens only), is one component of a comprehensive MRSA colonization surveillance program. It is not intended to diagnose MRSA infection nor to guide or monitor treatment for MRSA infections. Test performance is not FDA approved in patients less than 56 years old. Performed at [facility], [address] [place], [place] [address]     Coagulation Studies: No results for input(s): "LABPROT", "INR" in the last 72 hours.  Urinalysis: No results for input(s): "COLORURINE", "LABSPEC", "PHURINE", "GLUCOSEU", "HGBUR", "BILIRUBINUR", "KETONESUR", "PROTEINUR", "UROBILINOGEN", "NITRITE", "LEUKOCYTESUR" in the last 72 hours.  Invalid input(s): "APPERANCEUR"    Imaging: ECHOCARDIOGRAM COMPLETE  Result Date: 10/22/2023    ECHOCARDIOGRAM REPORT   Patient Name:   Kristen Francis Date of Exam: 10/22/2023 Medical Rec #:  [id number]            Height:       63.0 in Accession #:    [id number]  Weight:       114.8 lb Date of Birth:  21-Nov-1946            BSA:          1.527 m Patient Age:    77 years             BP:           155/62 mmHg Patient Gender: F                    HR:           79 bpm. Exam Location:  [facility] Procedure: 2D Echo, Cardiac Doppler, Color Doppler, 3D Echo and Strain Analysis Indications:     Acute respiratory distress  History:         Patient has no prior history of Echocardiogram examinations.                  Stroke and TIA, Signs/Symptoms:Fatigue and Shortness of Breath;                   Risk Factors:Hypertension and Diabetes. CKD.  Sonographer:     Mikki Harbor Referring Phys:  [phone] Francoise Schaumann [place] Diagnosing Phys: Lorine Bears MD  Sonographer Comments: Global longitudinal strain was attempted. IMPRESSIONS  1. Left ventricular ejection fraction, by estimation, is 35 to 40%. The left ventricle has moderately decreased function. The left ventricle demonstrates regional wall motion abnormalities (see scoring diagram/findings for description). The left ventricular internal cavity size was moderately dilated. There is moderate left ventricular hypertrophy. Left ventricular diastolic parameters are consistent with Grade II diastolic dysfunction (pseudonormalization). There is severe hypokinesis of the left ventricular, inferior wall and inferolateral wall. The average left ventricular global longitudinal strain is -13.2 %. The global longitudinal strain is abnormal.  2. Right ventricular systolic function is normal. The right ventricular size is normal. There is severely elevated pulmonary artery systolic pressure. The estimated right ventricular systolic pressure is 94.5 mmHg.  3. Left atrial size was severely dilated.  4. Right atrial size was mildly dilated.  5. Moderate pericardial effusion. The pericardial effusion is circumferential.  6. The mitral valve is normal in structure. Moderate to severe mitral valve regurgitation. No evidence of mitral stenosis. Moderate mitral annular calcification.  7. The aortic valve is normal in structure. Aortic valve regurgitation is moderate to severe. Aortic valve sclerosis/calcification is present, without any evidence of aortic stenosis.  8. Pulmonic valve regurgitation is moderate.  9. Severely dilated pulmonary artery. 10. The inferior vena cava is normal in size with <50% respiratory variability, suggesting right atrial pressure of 8 mmHg. FINDINGS  Left Ventricle: Left ventricular ejection fraction, by estimation, is 35 to 40%. The left ventricle  has moderately decreased function. The left ventricle demonstrates regional wall motion abnormalities. The average left ventricular global longitudinal strain is -13.2 %. The global longitudinal strain is abnormal. The left ventricular internal cavity size was moderately dilated. There is moderate left ventricular hypertrophy. Left ventricular diastolic parameters are consistent with Grade II diastolic dysfunction (pseudonormalization). Right Ventricle: The right ventricular size is normal. No increase in right ventricular wall thickness. Right ventricular systolic function is normal. There is severely elevated pulmonary artery systolic pressure. The tricuspid regurgitant velocity is 4.65 m/s, and with an assumed right atrial pressure of 8 mmHg, the estimated right ventricular systolic pressure is 94.5 mmHg. Left Atrium: Left atrial size was severely dilated. Right Atrium: Right atrial size was mildly dilated. Pericardium: A moderately sized  pericardial effusion is present. The pericardial effusion is circumferential. Mitral Valve: The mitral valve is normal in structure. There is moderate thickening of the mitral valve leaflet(s). There is mild calcification of the mitral valve leaflet(s). Moderate mitral annular calcification. Moderate to severe mitral valve regurgitation. No evidence of mitral valve stenosis. MV peak gradient, 3.0 mmHg. The mean mitral valve gradient is 1.0 mmHg. Tricuspid Valve: The tricuspid valve is normal in structure. Tricuspid valve regurgitation is mild . No evidence of tricuspid stenosis. Aortic Valve: The aortic valve is normal in structure. Aortic valve regurgitation is moderate to severe. Aortic regurgitation PHT measures 519 msec. Aortic valve sclerosis/calcification is present, without any evidence of aortic stenosis. Aortic valve mean gradient measures 6.0 mmHg. Aortic valve peak gradient measures 14.4 mmHg. Aortic valve area, by VTI measures 1.95 cm. Pulmonic Valve: The pulmonic  valve was normal in structure. Pulmonic valve regurgitation is moderate. No evidence of pulmonic stenosis. Aorta: The aortic root is normal in size and structure. Pulmonary Artery: The pulmonary artery is severely dilated. Venous: The inferior vena cava is normal in size with less than 50% respiratory variability, suggesting right atrial pressure of 8 mmHg. IAS/Shunts: No atrial level shunt detected by color flow Doppler. Additional Comments: There is a small pleural effusion in the left lateral region.  LEFT VENTRICLE PLAX 2D LVIDd:         6.00 cm      Diastology LVIDs:         4.90 cm      LV e' medial:    4.13 cm/s LV PW:         1.30 cm      LV E/e' medial:  20.2 LV IVS:        1.40 cm      LV e' lateral:   5.44 cm/s LVOT diam:     1.90 cm      LV E/e' lateral: 15.3 LV SV:         79 LV SV Index:   52           2D Longitudinal Strain LVOT Area:     2.84 cm     2D Strain GLS Avg:     -13.2 %  LV Volumes (MOD) LV vol d, MOD A2C: 142.0 ml LV vol d, MOD A4C: 115.0 ml LV vol s, MOD A2C: 62.8 ml LV vol s, MOD A4C: 70.5 ml LV SV MOD A2C:     79.2 ml LV SV MOD A4C:     115.0 ml LV SV MOD BP:      61.9 ml RIGHT VENTRICLE RV Basal diam:  3.90 cm RV Mid diam:    3.30 cm RV S prime:     14.80 cm/s TAPSE (M-mode): 1.9 cm LEFT ATRIUM              Index        RIGHT ATRIUM           Index LA diam:        4.80 cm  3.14 cm/m   RA Area:     18.90 cm LA Vol (A2C):   144.0 ml 94.29 ml/m  RA Volume:   55.80 ml  36.54 ml/m LA Vol (A4C):   121.0 ml 79.23 ml/m LA Biplane Vol: 138.0 ml 90.36 ml/m  AORTIC VALVE                     PULMONIC VALVE AV Area (Vmax):  2.10 cm      PV Vmax:       1.14 m/s AV Area (Vmean):   1.91 cm      PV Peak grad:  5.2 mmHg AV Area (VTI):     1.95 cm AV Vmax:           190.00 cm/s AV Vmean:          115.000 cm/s AV VTI:            0.407 m AV Peak Grad:      14.4 mmHg AV Mean Grad:      6.0 mmHg LVOT Vmax:         141.00 cm/s LVOT Vmean:        77.300 cm/s LVOT VTI:          0.280 m LVOT/AV VTI  ratio: 0.69 AI PHT:            519 msec  AORTA Ao Root diam: 3.60 cm Ao Asc diam:  3.40 cm MITRAL VALVE                  TRICUSPID VALVE MV Area (PHT): 3.70 cm       TR Peak grad:   86.5 mmHg MV Area VTI:   3.36 cm       TR Vmax:        465.00 cm/s MV Peak grad:  3.0 mmHg MV Mean grad:  1.0 mmHg       SHUNTS MV Vmax:       0.87 m/s       Systemic VTI:  0.28 m MV Vmean:      56.8 cm/s      Systemic Diam: 1.90 cm MV Decel Time: 205 msec MR Peak grad:    138.3 mmHg MR Mean grad:    92.0 mmHg MR Vmax:         588.00 cm/s MR Vmean:        457.0 cm/s MR PISA:         2.26 cm MR PISA Eff ROA: 36 mm MR PISA Radius:  0.60 cm MV E velocity: 83.30 cm/s MV A velocity: 93.30 cm/s MV E/A ratio:  0.89 Lorine Bears MD Electronically signed by Lorine Bears MD Signature Date/Time: 10/22/2023/5:06:31 PM    Final    DG Chest Portable 1 View  Result Date: 10/22/2023 CLINICAL DATA:  Respiratory distress. EXAM: PORTABLE CHEST 1 VIEW COMPARISON:  08/03/2023 FINDINGS: Cardiac enlargement. Aortic atherosclerosis. Small left pleural effusion is suspected. Diffuse increase interstitial markings identified bilaterally with bilateral patchy airspace opacities. There is advanced degenerative joint disease involving the glenohumeral and acromioclavicular joints. IMPRESSION: Cardiac enlargement, small left pleural effusion, increased interstitial markings and bilateral patchy airspace opacities compatible with moderate pulmonary edema. Electronically Signed   By: Signa Kell M.D.   On: 10/22/2023 07:00     Medications:    anticoagulant sodium citrate      aspirin EC  81 mg Oral Daily   carvedilol  25 mg Oral BID WC   Chlorhexidine Gluconate Cloth  6 each Topical Q0600   cinacalcet  120 mg Oral Q supper   furosemide  80 mg Oral Once per day on Sunday Tuesday Thursday Saturday   heparin  5,000 Units Subcutaneous Q8H   hydrALAZINE  50 mg Oral Q8H   losartan  50 mg Oral Daily   rosuvastatin  5 mg Oral Once per day on  Sunday Tuesday Thursday Saturday   sevelamer carbonate  1,600 mg Oral TID WC   alteplase, anticoagulant sodium citrate, feeding supplement (NEPRO CARB STEADY), heparin, heparin, lidocaine (PF), lidocaine-prilocaine, ondansetron **OR** ondansetron (ZOFRAN) IV, pentafluoroprop-tetrafluoroeth  Assessment/ Plan:  77 y.o. female with past medical history of ESRD on HD, hypertension, hyperlipidemia, CVA, macular degeneration who was admitted for acute respiratory failure requiring BiPAP.  [facility] nephrology/MWF/[facility]/left upper extremity AV fistula  1.  ESRD on HD.  Patient underwent hemodialysis treatment yesterday.  Tolerated well and UF achieved was 3 kg.  We will plan for dialysis treatment again tomorrow.  We will work around any additional cardiac procedures.  2.  Acute respiratory failure/acute systolic and diastolic heart failure with ejection fraction of 35 to 40%..  Patient doing much better with volume removal.  UF achieved was 3 kg yesterday.  We will plan for additional UF with dialysis tomorrow.  3.  Anemia of chronic kidney disease.  Continue to monitor hemoglobin over the course of the hospitalization.  4.  Secondary hyperparathyroidism.  Monitor bone metabolism parameters over the course of the hospitalization.   LOS: 1 Dickey Caamano 11/19/20247:20 PM

## 2023-10-23 NOTE — Plan of Care (Signed)

## 2023-10-23 NOTE — Discharge Planning (Signed)
ESTBLISHED HEMODIALYSIS Outpatient Facility  [facility]  [address]  [place] [address] [phone]  Schedule: MWF 5:30am  Confirmed patient is active and can resume schedule upon discharge.  Dimas Chyle Dialysis Coordinator II  [facility] Cell: [phone] eFax: [phone] [name]

## 2023-10-23 NOTE — Consult Note (Signed)
[facility] CLINIC CARDIOLOGY CONSULT NOTE       Patient ID: Kristen Francis MRN: [id number] DOB/AGE: 06/29/1946 77 y.o.  Admit date: 10/22/2023 Referring Physician Fabienne Bruns, MD Primary Physician Sallyanne Kuster, NP Primary Cardiologist Windell Norfolk, MD  Reason for Consultation AoCHF, moderate pericardial effusion  HPI: Kristen Francis is a 77 y.o. female  with a past medical history of ESRD on HD (AV fistula on left upper arm; M/W/F), hypertension, hyperlipidemia, CVA, GERD, anemia who presented to the ED on 10/22/2023 for respiratory distress. Cardiology was consulted for further evaluation.   Patient presented to the ED due to respiratory distress yesterday morning on her way to dialysis. 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 Reports SOB is newer for her. Denies chest pain, palpitations, lower extremity edema or orthopnea.  Work up in the ED notable for Na 140, K 5.1, Cr 7.10, GFR 6, Hgb 10.7, WBC 9.8, Lactic acid 1.1. BNP > 4,500. Troponin 31 > 46. EKG in ED showed sinus rhythm with multiple PVCs, rate 93 bpm. Echo this admission showed EF 35-40% with LV wall motion abnormalities, severely elevated pulmonary artery pressure, and moderate pericardial effusion. CXR showed patchy airspaces, cardiac enlargement (stable from previous CXR), left pleural effusion and pulmonary edema. In ED yesterday, BP 178/88, SpO2 of 60%, started on BiPAP, IV Lasix 120 mg and nitroglycerin gtt.   At the time of my evaluation this morning, patient was resting in her hospital bed at an incline in no acute distress with husband at bedside. Patient reports she was short of breath and "felt exhausted" on Saturday while cleaning which worsened on Monday morning on her way to dialysis. Patient states when she arrived to ED yesterday she was placed on O2 and had dialysis. Patient states she is feeling better and her SOB  is improving. Patient endorses sometimes adding salt to her food. Patient states she has minimal UOP.   Review of systems complete and found to be negative unless listed above    Past Medical History:  Diagnosis Date   Anemia    Arthritis    Chronic kidney disease    ESRD   Complication of anesthesia    had procedure and felt incision early 80s   Gout    Hyperlipemia    Hypertension    Macular degeneration, right eye    Stroke [facility])     Past Surgical History:  Procedure Laterality Date   A/V FISTULAGRAM Left 12/25/2018   Procedure: A/V FISTULAGRAM;  Surgeon: Renford Dills, MD;  Location: [facility] INVASIVE CV LAB;  Service: Cardiovascular;  Laterality: Left;   A/V FISTULAGRAM Left 10/09/2023   Procedure: A/V Fistulagram;  Surgeon: Renford Dills, MD;  Location: [facility] INVASIVE CV LAB;  Service: Cardiovascular;  Laterality: Left;   ABDOMINAL HYSTERECTOMY     APPENDECTOMY     AV FISTULA PLACEMENT Left 07/12/2018   Procedure: INSERTION OF ARTERIOVENOUS (AV) GORE-TEX GRAFT ARM ( BRACHIAL AXILLARY );  Surgeon: Renford Dills, MD;  Location: [facility] ORS;  Service: Vascular;  Laterality: Left;   COLONOSCOPY WITH PROPOFOL N/A 01/14/2016   Procedure: COLONOSCOPY WITH PROPOFOL;  Surgeon: Scot Jun, MD;  Location: [facility] ENDOSCOPY;  Service: Endoscopy;  Laterality: N/A;   COLONOSCOPY WITH PROPOFOL N/A 02/14/2022   Procedure: COLONOSCOPY WITH PROPOFOL;  Surgeon: Regis Bill, MD;  Location: [facility] ENDOSCOPY;  Service: Endoscopy;  Laterality: N/A;   TONSILLECTOMY      (  Not in a hospital admission)  Social History   Socioeconomic History   Marital status: Married    Spouse name: Not on file   Number of children: Not on file   Years of education: Not on file   Highest education level: Not on file  Occupational History   Not on file  Tobacco Use   Smoking status: Former   Smokeless tobacco: Never  Vaping Use   Vaping status: Never Used  Substance and Sexual Activity    Alcohol use: No   Drug use: No   Sexual activity: Not on file  Other Topics Concern   Not on file  Social History Narrative   Lives in  [place]; used to clean; no smoking; no alcohol.    Social Determinants of Health   Financial Resource Strain: Not on file  Food Insecurity: No Food Insecurity (10/22/2023)   Hunger Vital Sign    Worried About Running Out of Food in the Last Year: Never true    Ran Out of Food in the Last Year: Never true  Transportation Needs: No Transportation Needs (10/22/2023)   PRAPARE - Administrator, Civil Service (Medical): No    Lack of Transportation (Non-Medical): No  Physical Activity: Not on file  Stress: Not on file  Social Connections: Not on file  Intimate Partner Violence: Not At Risk (10/22/2023)   Humiliation, Afraid, Rape, and Kick questionnaire    Fear of Current or Ex-Partner: No    Emotionally Abused: No    Physically Abused: No    Sexually Abused: No    Family History  Problem Relation Age of Onset   Kidney disease Mother    Breast cancer Neg Hx      Vitals:   10/23/23 1000 10/23/23 1130 10/23/23 1150 10/23/23 1157  BP: (!) 158/76 (!) 154/72 (!) 160/80   Pulse: 70 63 72   Resp: 15 18 (!) 26   Temp:    98 F (36.7 C)  TempSrc:    Oral  SpO2: 100% 100% 100%   Height:        PHYSICAL EXAM General: Chronically-ill appearing elderly female, well nourished, in no acute distress. HEENT: Normocephalic and atraumatic. Neck: No JVD.   Lungs: Normal respiratory effort on 4L [place] (baseline RA).  Mild crackles bilaterally. Heart: HRRR. Normal S1 and S2 without gallops or murmurs.  Abdomen: Non-distended appearing.  Msk: Normal strength and tone for age. Extremities: Warm and well perfused. No clubbing, cyanosis. No edema.  Neuro: Alert and oriented X 3. Psych: Answers questions appropriately.   Labs: Basic Metabolic Panel: Recent Labs    10/22/23 0600 10/22/23 0955  NA 140  --   K 4.1 3.9  CL 95*  --   CO2 27   --   GLUCOSE 143*  --   BUN 53*  --   CREATININE 7.10*  --   CALCIUM 9.1  --    Liver Function Tests: No results for input(s): "AST", "ALT", "ALKPHOS", "BILITOT", "PROT", "ALBUMIN" in the last 72 hours. No results for input(s): "LIPASE", "AMYLASE" in the last 72 hours. CBC: Recent Labs    10/22/23 0600  WBC 9.8  HGB 10.7*  HCT 33.9*  MCV 90.4  PLT 204   Cardiac Enzymes: Recent Labs    10/22/23 0600 10/22/23 0955  TROPONINIHS 31* 46*   BNP: Recent Labs    10/22/23 0600  BNP >4,500.0*   D-Dimer: No results for input(s): "DDIMER" in the last 72 hours. Hemoglobin  A1C: Recent Labs    10/22/23 0955  HGBA1C 4.6*   Fasting Lipid Panel: No results for input(s): "CHOL", "HDL", "LDLCALC", "TRIG", "CHOLHDL", "LDLDIRECT" in the last 72 hours. Thyroid Function Tests: No results for input(s): "TSH", "T4TOTAL", "T3FREE", "THYROIDAB" in the last 72 hours.  Invalid input(s): "FREET3" Anemia Panel: No results for input(s): "VITAMINB12", "FOLATE", "FERRITIN", "TIBC", "IRON", "RETICCTPCT" in the last 72 hours.   Radiology: ECHOCARDIOGRAM COMPLETE  Result Date: 10/22/2023    ECHOCARDIOGRAM REPORT   Patient Name:   Kristen Francis Banka Date of Exam: 10/22/2023 Medical Rec #:  [id number]            Height:       63.0 in Accession #:    [id number]           Weight:       114.8 lb Date of Birth:  1946/08/03            BSA:          1.527 m Patient Age:    77 years             BP:           155/62 mmHg Patient Gender: F                    HR:           79 bpm. Exam Location:  [facility] Procedure: 2D Echo, Cardiac Doppler, Color Doppler, 3D Echo and Strain Analysis Indications:     Acute respiratory distress  History:         Patient has no prior history of Echocardiogram examinations.                  Stroke and TIA, Signs/Symptoms:Fatigue and Shortness of Breath;                  Risk Factors:Hypertension and Diabetes. CKD.  Sonographer:     Mikki Harbor Referring Phys:  [phone] Francoise Schaumann [place]  Diagnosing Phys: Lorine Bears MD  Sonographer Comments: Global longitudinal strain was attempted. IMPRESSIONS  1. Left ventricular ejection fraction, by estimation, is 35 to 40%. The left ventricle has moderately decreased function. The left ventricle demonstrates regional wall motion abnormalities (see scoring diagram/findings for description). The left ventricular internal cavity size was moderately dilated. There is moderate left ventricular hypertrophy. Left ventricular diastolic parameters are consistent with Grade II diastolic dysfunction (pseudonormalization). There is severe hypokinesis of the left ventricular, inferior wall and inferolateral wall. The average left ventricular global longitudinal strain is -13.2 %. The global longitudinal strain is abnormal.  2. Right ventricular systolic function is normal. The right ventricular size is normal. There is severely elevated pulmonary artery systolic pressure. The estimated right ventricular systolic pressure is 94.5 mmHg.  3. Left atrial size was severely dilated.  4. Right atrial size was mildly dilated.  5. Moderate pericardial effusion. The pericardial effusion is circumferential.  6. The mitral valve is normal in structure. Moderate to severe mitral valve regurgitation. No evidence of mitral stenosis. Moderate mitral annular calcification.  7. The aortic valve is normal in structure. Aortic valve regurgitation is moderate to severe. Aortic valve sclerosis/calcification is present, without any evidence of aortic stenosis.  8. Pulmonic valve regurgitation is moderate.  9. Severely dilated pulmonary artery. 10. The inferior vena cava is normal in size with <50% respiratory variability, suggesting right atrial pressure of 8 mmHg. FINDINGS  Left Ventricle: Left ventricular ejection fraction, by estimation, is 35  to 40%. The left ventricle has moderately decreased function. The left ventricle demonstrates regional wall motion abnormalities. The average left  ventricular global longitudinal strain is -13.2 %. The global longitudinal strain is abnormal. The left ventricular internal cavity size was moderately dilated. There is moderate left ventricular hypertrophy. Left ventricular diastolic parameters are consistent with Grade II diastolic dysfunction (pseudonormalization). Right Ventricle: The right ventricular size is normal. No increase in right ventricular wall thickness. Right ventricular systolic function is normal. There is severely elevated pulmonary artery systolic pressure. The tricuspid regurgitant velocity is 4.65 m/s, and with an assumed right atrial pressure of 8 mmHg, the estimated right ventricular systolic pressure is 94.5 mmHg. Left Atrium: Left atrial size was severely dilated. Right Atrium: Right atrial size was mildly dilated. Pericardium: A moderately sized pericardial effusion is present. The pericardial effusion is circumferential. Mitral Valve: The mitral valve is normal in structure. There is moderate thickening of the mitral valve leaflet(s). There is mild calcification of the mitral valve leaflet(s). Moderate mitral annular calcification. Moderate to severe mitral valve regurgitation. No evidence of mitral valve stenosis. MV peak gradient, 3.0 mmHg. The mean mitral valve gradient is 1.0 mmHg. Tricuspid Valve: The tricuspid valve is normal in structure. Tricuspid valve regurgitation is mild . No evidence of tricuspid stenosis. Aortic Valve: The aortic valve is normal in structure. Aortic valve regurgitation is moderate to severe. Aortic regurgitation PHT measures 519 msec. Aortic valve sclerosis/calcification is present, without any evidence of aortic stenosis. Aortic valve mean gradient measures 6.0 mmHg. Aortic valve peak gradient measures 14.4 mmHg. Aortic valve area, by VTI measures 1.95 cm. Pulmonic Valve: The pulmonic valve was normal in structure. Pulmonic valve regurgitation is moderate. No evidence of pulmonic stenosis. Aorta: The  aortic root is normal in size and structure. Pulmonary Artery: The pulmonary artery is severely dilated. Venous: The inferior vena cava is normal in size with less than 50% respiratory variability, suggesting right atrial pressure of 8 mmHg. IAS/Shunts: No atrial level shunt detected by color flow Doppler. Additional Comments: There is a small pleural effusion in the left lateral region.  LEFT VENTRICLE PLAX 2D LVIDd:         6.00 cm      Diastology LVIDs:         4.90 cm      LV e' medial:    4.13 cm/s LV PW:         1.30 cm      LV E/e' medial:  20.2 LV IVS:        1.40 cm      LV e' lateral:   5.44 cm/s LVOT diam:     1.90 cm      LV E/e' lateral: 15.3 LV SV:         79 LV SV Index:   52           2D Longitudinal Strain LVOT Area:     2.84 cm     2D Strain GLS Avg:     -13.2 %  LV Volumes (MOD) LV vol d, MOD A2C: 142.0 ml LV vol d, MOD A4C: 115.0 ml LV vol s, MOD A2C: 62.8 ml LV vol s, MOD A4C: 70.5 ml LV SV MOD A2C:     79.2 ml LV SV MOD A4C:     115.0 ml LV SV MOD BP:      61.9 ml RIGHT VENTRICLE RV Basal diam:  3.90 cm RV Mid diam:    3.30 cm RV S  prime:     14.80 cm/s TAPSE (M-mode): 1.9 cm LEFT ATRIUM              Index        RIGHT ATRIUM           Index LA diam:        4.80 cm  3.14 cm/m   RA Area:     18.90 cm LA Vol (A2C):   144.0 ml 94.29 ml/m  RA Volume:   55.80 ml  36.54 ml/m LA Vol (A4C):   121.0 ml 79.23 ml/m LA Biplane Vol: 138.0 ml 90.36 ml/m  AORTIC VALVE                     PULMONIC VALVE AV Area (Vmax):    2.10 cm      PV Vmax:       1.14 m/s AV Area (Vmean):   1.91 cm      PV Peak grad:  5.2 mmHg AV Area (VTI):     1.95 cm AV Vmax:           190.00 cm/s AV Vmean:          115.000 cm/s AV VTI:            0.407 m AV Peak Grad:      14.4 mmHg AV Mean Grad:      6.0 mmHg LVOT Vmax:         141.00 cm/s LVOT Vmean:        77.300 cm/s LVOT VTI:          0.280 m LVOT/AV VTI ratio: 0.69 AI PHT:            519 msec  AORTA Ao Root diam: 3.60 cm Ao Asc diam:  3.40 cm MITRAL VALVE                   TRICUSPID VALVE MV Area (PHT): 3.70 cm       TR Peak grad:   86.5 mmHg MV Area VTI:   3.36 cm       TR Vmax:        465.00 cm/s MV Peak grad:  3.0 mmHg MV Mean grad:  1.0 mmHg       SHUNTS MV Vmax:       0.87 m/s       Systemic VTI:  0.28 m MV Vmean:      56.8 cm/s      Systemic Diam: 1.90 cm MV Decel Time: 205 msec MR Peak grad:    138.3 mmHg MR Mean grad:    92.0 mmHg MR Vmax:         588.00 cm/s MR Vmean:        457.0 cm/s MR PISA:         2.26 cm MR PISA Eff ROA: 36 mm MR PISA Radius:  0.60 cm MV E velocity: 83.30 cm/s MV A velocity: 93.30 cm/s MV E/A ratio:  0.89 Lorine Bears MD Electronically signed by Lorine Bears MD Signature Date/Time: 10/22/2023/5:06:31 PM    Final    DG Chest Portable 1 View  Result Date: 10/22/2023 CLINICAL DATA:  Respiratory distress. EXAM: PORTABLE CHEST 1 VIEW COMPARISON:  08/03/2023 FINDINGS: Cardiac enlargement. Aortic atherosclerosis. Small left pleural effusion is suspected. Diffuse increase interstitial markings identified bilaterally with bilateral patchy airspace opacities. There is advanced degenerative joint disease involving the glenohumeral and acromioclavicular joints. IMPRESSION: Cardiac enlargement, small left pleural effusion, increased  interstitial markings and bilateral patchy airspace opacities compatible with moderate pulmonary edema. Electronically Signed   By: Signa Kell M.D.   On: 10/22/2023 07:00   MM 3D SCREENING MAMMOGRAM BILATERAL BREAST  Result Date: 10/19/2023 CLINICAL DATA:  Screening. EXAM: DIGITAL SCREENING BILATERAL MAMMOGRAM WITH TOMOSYNTHESIS AND CAD TECHNIQUE: Bilateral screening digital craniocaudal and mediolateral oblique mammograms were obtained. Bilateral screening digital breast tomosynthesis was performed. The images were evaluated with computer-aided detection. COMPARISON:  Previous exam(s). ACR Breast Density Category c: The breasts are heterogeneously dense, which may obscure small masses. FINDINGS: There are no  findings suspicious for malignancy. IMPRESSION: No mammographic evidence of malignancy. A result letter of this screening mammogram will be mailed directly to the patient. RECOMMENDATION: Screening mammogram in one year. (Code:SM-B-01Y) BI-RADS CATEGORY  1: Negative. Electronically Signed   By: Elberta Fortis M.D.   On: 10/19/2023 10:33   PERIPHERAL VASCULAR CATHETERIZATION  Result Date: 10/09/2023 See surgical note for result.   ECHO As above (EF 35-40%, LV wall abnormalities, severely elevated pulmonary artery systolic pressure, moderate pericardial effusion)  TELEMETRY reviewed by me 10/23/2023: SR, rate 60-70s occasional PVCs  EKG reviewed by me: sinus rhythm with multiple PVCs, rate 93 bpm  Data reviewed by me 10/23/2023: last 24h vitals tele labs imaging I/O ED provider note, admission H&P, nephrology note.  Principal Problem:   Volume overload Active Problems:   Anemia in chronic renal disease   Essential hypertension   Stroke (HCC)   End stage renal disease (HCC)   Type 2 diabetes mellitus with other diabetic kidney complication (HCC)   Gastroesophageal reflux disease without esophagitis   Acute respiratory failure with hypoxia (HCC)    ASSESSMENT AND PLAN:  Amand Baysinger is a 77 y.o. female  with a past medical history of ESRD on HD (AV fistula on left upper arm; M/W/F), hypertension, hyperlipidemia, CVA, GERD, anemia  who presented to the ED on 10/22/2023 for respiratory distress. Cardiology was consulted for further evaluation.   # Acute respiratory failure with hypoxia  # Acute HFrEF (EF 35-40% on admission) # Pericardial Effusion  Patient presented to ED in acute respiratory distress, was placed on BiPAP. Patient has been experiencing shortness of breath for past 3 days, worse with exertion.  BNP > 4,500. Troponin mildly elevated 31 > 46. EKG in ED showed sinus rhythm with multiple PVCs, rate 93 bpm. Echo showed EF 35-40% with LV wall motion abnormalities, severely  elevated pulmonary artery pressure, and moderate pericardial effusion. This is possibly newly reduced EF, have no other echo to compare. Patient is currently on 4L [place], with no O2 requirement baseline. SOB improving, denies CP. -Discontinue amlodipine given reduced EF.  Continue losartan 50 mg daily, carvedilol 25 mg daily, hydralazine 50 mg 3 times daily. -Continue Lasix 80 mg on nondialysis days. -Plan for R + LHC Thursday morning with Dr. Darrold Junker for further evaluation of newly reduced EF.  -Start aspirin 81 mg daily.  # Hypertension # Hyperlipidemia BP elevated since admission -See plan for meds as above. -Continue rosuvastatin 5 mg on nondialysis days.  #ESRD on HD # Anemia Patient was on her way to dialysis yesterday and developed acute respiratory distress which brought her to the ED. Patient has dialysis M,W,F. In ED, Cr 7.10 with stable Na, K. Patient had dialysis yesterday, took off 3L. Patient will have dialysis tomorrow.  -Management per nephrology.    This patient's plan of care was discussed and created with Dr. Darrold Junker and he is in agreement.  Signed: Thana Ates, Student-PA  10/23/2023, 1:42 PM [facility] Cardiology

## 2023-10-23 NOTE — TOC Initial Note (Addendum)
Transition of Care [facility]) - Initial/Assessment Note    Patient Details  Name: Kristen Francis MRN: [id number] Date of Birth: 1946-04-25  Transition of [facility]) CM/SW Contact:    Marquita Palms, LCSW Phone Number: 10/23/2023, 10:09 AM  Clinical Narrative:                  Health risk assessment complete. CSW met with patient at beside. 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 She reports she uses Development worker, community on [employer]. Patient reports she went to her PCP last week. Patient lives home with her husband and she will have transportation home upon discharge.Patient reports she is on dialysis. Patient admitting into the hospital.       Patient Goals and CMS Choice            Expected Discharge Plan and Services                                              Prior Living Arrangements/Services                       Activities of Daily Living   ADL Screening (condition at time of admission) Independently performs ADLs?: Yes (appropriate for developmental age) Is the patient deaf or have difficulty hearing?: No Does the patient have difficulty seeing, even when wearing glasses/contacts?: No Does the patient have difficulty concentrating, remembering, or making decisions?: No  Permission Sought/Granted                  Emotional Assessment              Admission diagnosis:  Volume overload [E87.70] Acute respiratory failure with hypoxia (HCC) [J96.01] Patient Active Problem List   Diagnosis Date Noted   Volume overload 10/22/2023   Acute respiratory failure with hypoxia (HCC) 10/22/2023   SOB (shortness of breath) 03/02/2023   Sepsis (HCC) 03/02/2023   TIA (transient ischemic attack) 06/03/2021   Gastroesophageal reflux disease without esophagitis 12/16/2020   Nausea 12/16/2020   Left foot pain 10/16/2020   Left carotid stenosis 09/06/2020   Multinodular goiter  09/06/2020   Atherosclerosis of aorta (HCC) 08/18/2020   Other fatigue 07/25/2020   Left carotid bruit 07/25/2020   Decreased appetite 07/25/2020   Depression, major, single episode, moderate (HCC) 07/25/2020   Elevated ferritin 07/20/2020   Encounter for general adult medical examination with abnormal findings 12/13/2019   Encounter for screening mammogram for malignant neoplasm of breast 12/13/2019   Dysuria 12/13/2019   Complication from renal dialysis device 12/02/2018   Allergic contact dermatitis, unspecified cause 09/21/2018   Coagulation defect, unspecified (HCC) 09/21/2018   Diarrhea, unspecified 09/21/2018   Fever, unspecified 09/21/2018   Headache, unspecified 09/21/2018   Hypertensive chronic kidney disease with stage 1 through stage 4 chronic kidney disease, or unspecified chronic kidney disease 09/21/2018   Iron deficiency anemia, unspecified 09/21/2018   Moderate protein-calorie malnutrition (HCC) 09/21/2018   Other fluid overload 09/21/2018   Pain, unspecified 09/21/2018   Secondary hyperparathyroidism of renal origin (HCC) 09/21/2018   Type 2 diabetes mellitus with other diabetic kidney complication (HCC) 09/21/2018   Anemia in chronic kidney disease 09/21/2018   Encounter for immunization 09/21/2018   Peripheral vascular disease (HCC) 09/21/2018   Furuncle of vulva 09/10/2018   End stage renal disease (  HCC) 07/04/2018   Anemia in chronic renal disease 12/31/2017   Essential hypertension 12/31/2017   Stroke (HCC) 12/31/2017   Anemia 09/17/2017   Monoclonal gammopathy of unknown significance (MGUS) 09/17/2017   PCP:  Sallyanne Kuster, NP Pharmacy:   [id number] [place], [place] - [address] AT [facility] OF [address] [place] [address] Phone: [phone] Fax: [phone]     Social Determinants of Health (SDOH) Social History: SDOH Screenings   Food Insecurity: No Food Insecurity (10/22/2023)   Housing: Low Risk  (10/22/2023)  Transportation Needs: No Transportation Needs (10/22/2023)  Utilities: Not At Risk (10/22/2023)  Alcohol Screen: Low Risk  (09/06/2022)  Depression (PHQ2-9): Low Risk  (05/24/2023)  Tobacco Use: Medium Risk (10/22/2023)   SDOH Interventions:     Readmission Risk Interventions    10/23/2023   10:07 AM  Readmission Risk Prevention Plan  Transportation Screening Complete  PCP or Specialist Appt within 5-7 Days Complete  Home Care Screening Complete  Medication Review (RN CM) Complete

## 2023-10-23 NOTE — Progress Notes (Signed)
PROGRESS NOTE   HPI was taken from Dr. Alvester Morin: Kristen Francis is a 77 y.o. female with medical history significant of ESRD, HTN, CVA, GERD presenting with acute respiratory failure with hypoxia, volume overload.  Patient reports acute onset of shortness of breath around 2 AM.  Worsening orthopnea, PND.  Baseline ESRD on hemodialysis Monday Wednesday Friday.  Denies any missed episodes of dialysis.  Was due for dialysis this morning prior to onset of symptoms.  Denies any excessive fluid intake.  Does admit to high sodium intake.  No chest pain.  No abdominal pain.  No nausea or vomiting.  No reported tobacco use.  No fevers or chills.  No reported sick contacts. Presented to the ER afebrile, hemodynamically stable.  Initially satting 60% on room air.  Transition to BiPAP to help with respiratory support.  White count 9.8, hemoglobin 10.7, platelets 204, troponin 30s, BNP greater than 4500, creatinine 7.1.  Chest x-ray with cardiac enlargement small left pleural effusion and changes consistent with moderate pulmonary edema.    Erla Rolfe  WGN:[id number] DOB: March 16, 1946 DOA: 10/22/2023 PCP: Sallyanne Kuster, NP  Assessment & Plan:   Principal Problem:   Volume overload Active Problems:   Acute respiratory failure with hypoxia (HCC)   Essential hypertension   End stage renal disease (HCC)   Type 2 diabetes mellitus with other diabetic kidney complication (HCC)   Anemia in chronic renal disease   Stroke (HCC)   Gastroesophageal reflux disease without esophagitis  Assessment and Plan: Pulmonary edema: as per CXR. Improved after urgent HD yesterday. Echo showed reduced EF 35-40%, moderate pericardial effusion, grade II diastolic dysfunction, severe hypokinesis of LV, LA is severely dilated, RA is mildly dilated, mod-sever MR & severely dilated pulmon artery. Nephro following and recs apprec. Cardio consulted  Acute combined CHF exacerbation: no previous hx of CHF as per pt.  Cardio consulted  Moderate pericardial effusion: etiology unclear. As per echo. Likely contributed to pt's dyspnea. Cardio consulted  Acute respiratory hypoxic failure: likely secondary to all above. Continue on coreg, losartan, lasix. Continue on supplemental oxygen and wean as toleated. Initially required BiPAP which as since been weaned off    ESRD on HD MWF. Nephro following and recs apprec    HTN: continue on coreg, losartan, hydralazine, amlodipine   DM2: well controlled, 4.6. Does not need SSI.    GERD: continue on PPI   Hx of CVA: continue on aspirin, statin    ACD: likely secondary to ESRD. No need for transfuse currently       DVT prophylaxis: heparin  Code Status: full  Family Communication:  Disposition Plan: likely d/c back home   Level of care: Progressive Consultants:  Cardio  Nepho   Procedures:  Antimicrobials:   Subjective: Pt c/o malaise   Objective: Vitals:   10/23/23 1400 10/23/23 1415 10/23/23 1430 10/23/23 1445  BP: (!) 153/73  (!) 148/70   Pulse: 69 70 70 70  Resp: (!) 27 (!) 25 (!) 26 (!) 21  Temp:      TempSrc:      SpO2: 100% 100% 100% 100%  Height:       No intake or output data in the 24 hours ending 10/23/23 1524 There were no vitals filed for this visit.  Examination:  General exam: Appears calm and comfortable  Respiratory system: decreased breath sounds b./l  Cardiovascular system: S1 & S2 +. No rubs, gallops or clicks.  Gastrointestinal system: Abdomen is nondistended, soft and nontender. . Normal  bowel sounds heard. Central nervous system: Alert and oriented. Moves all extremities  Psychiatry: Judgement and insight appear normal. Mood & affect appropriate.     Data Reviewed: I have personally reviewed following labs and imaging studies  CBC: Recent Labs  Lab 10/22/23 0600  WBC 9.8  HGB 10.7*  HCT 33.9*  MCV 90.4  PLT 204   Basic Metabolic Panel: Recent Labs  Lab 10/22/23 0600 10/22/23 0955  NA 140  --    K 4.1 3.9  CL 95*  --   CO2 27  --   GLUCOSE 143*  --   BUN 53*  --   CREATININE 7.10*  --   CALCIUM 9.1  --    GFR: Estimated Creatinine Clearance: 5.5 mL/min (A) (by C-G formula based on SCr of 7.1 mg/dL (H)). Liver Function Tests: No results for input(s): "AST", "ALT", "ALKPHOS", "BILITOT", "PROT", "ALBUMIN" in the last 168 hours. No results for input(s): "LIPASE", "AMYLASE" in the last 168 hours. No results for input(s): "AMMONIA" in the last 168 hours. Coagulation Profile: No results for input(s): "INR", "PROTIME" in the last 168 hours. Cardiac Enzymes: No results for input(s): "CKTOTAL", "CKMB", "CKMBINDEX", "TROPONINI" in the last 168 hours. BNP (last 3 results) Recent Labs    08/03/23 1111  PROBNP 60,100*   HbA1C: Recent Labs    10/22/23 0955  HGBA1C 4.6*   CBG: Recent Labs  Lab 10/22/23 1158 10/22/23 1732 10/23/23 0058 10/23/23 0750 10/23/23 1152  GLUCAP 70 118* 92 126* 107*   Lipid Profile: No results for input(s): "CHOL", "HDL", "LDLCALC", "TRIG", "CHOLHDL", "LDLDIRECT" in the last 72 hours. Thyroid Function Tests: No results for input(s): "TSH", "T4TOTAL", "FREET4", "T3FREE", "THYROIDAB" in the last 72 hours. Anemia Panel: No results for input(s): "VITAMINB12", "FOLATE", "FERRITIN", "TIBC", "IRON", "RETICCTPCT" in the last 72 hours. Sepsis Labs: Recent Labs  Lab 10/22/23 0600 10/22/23 0955  LATICACIDVEN 1.1 0.5    Recent Results (from the past 240 hour(s))  MRSA Next Gen by PCR, Nasal     Status: None   Collection Time: 10/22/23  9:55 AM   Specimen: Nasal Mucosa; Nasal Swab  Result Value Ref Range Status   MRSA by PCR Next Gen NOT DETECTED NOT DETECTED Final    Comment: (NOTE) The GeneXpert MRSA Assay (FDA approved for NASAL specimens only), is one component of a comprehensive MRSA colonization surveillance program. It is not intended to diagnose MRSA infection nor to guide or monitor treatment for MRSA infections. Test performance is not  FDA approved in patients less than 74 years old. Performed at [facility], [address], [place], [place] [address]          Radiology Studies: ECHOCARDIOGRAM COMPLETE  Result Date: 10/22/2023    ECHOCARDIOGRAM REPORT   Patient Name:   KHARMEN BUSCHMANN [facility] Date of Exam: 10/22/2023 Medical Rec #:  [id number]            Height:       63.0 in Accession #:    [id number]           Weight:       114.8 lb Date of Birth:  1946-06-02            BSA:          1.527 m Patient Age:    77 years             BP:           155/62 mmHg Patient Gender: F  HR:           79 bpm. Exam Location:  [facility] Procedure: 2D Echo, Cardiac Doppler, Color Doppler, 3D Echo and Strain Analysis Indications:     Acute respiratory distress  History:         Patient has no prior history of Echocardiogram examinations.                  Stroke and TIA, Signs/Symptoms:Fatigue and Shortness of Breath;                  Risk Factors:Hypertension and Diabetes. CKD.  Sonographer:     Mikki Harbor Referring Phys:  [phone] Francoise Schaumann [place] Diagnosing Phys: Lorine Bears MD  Sonographer Comments: Global longitudinal strain was attempted. IMPRESSIONS  1. Left ventricular ejection fraction, by estimation, is 35 to 40%. The left ventricle has moderately decreased function. The left ventricle demonstrates regional wall motion abnormalities (see scoring diagram/findings for description). The left ventricular internal cavity size was moderately dilated. There is moderate left ventricular hypertrophy. Left ventricular diastolic parameters are consistent with Grade II diastolic dysfunction (pseudonormalization). There is severe hypokinesis of the left ventricular, inferior wall and inferolateral wall. The average left ventricular global longitudinal strain is -13.2 %. The global longitudinal strain is abnormal.  2. Right ventricular systolic function is normal. The right ventricular size is normal. There is severely elevated  pulmonary artery systolic pressure. The estimated right ventricular systolic pressure is 94.5 mmHg.  3. Left atrial size was severely dilated.  4. Right atrial size was mildly dilated.  5. Moderate pericardial effusion. The pericardial effusion is circumferential.  6. The mitral valve is normal in structure. Moderate to severe mitral valve regurgitation. No evidence of mitral stenosis. Moderate mitral annular calcification.  7. The aortic valve is normal in structure. Aortic valve regurgitation is moderate to severe. Aortic valve sclerosis/calcification is present, without any evidence of aortic stenosis.  8. Pulmonic valve regurgitation is moderate.  9. Severely dilated pulmonary artery. 10. The inferior vena cava is normal in size with <50% respiratory variability, suggesting right atrial pressure of 8 mmHg. FINDINGS  Left Ventricle: Left ventricular ejection fraction, by estimation, is 35 to 40%. The left ventricle has moderately decreased function. The left ventricle demonstrates regional wall motion abnormalities. The average left ventricular global longitudinal strain is -13.2 %. The global longitudinal strain is abnormal. The left ventricular internal cavity size was moderately dilated. There is moderate left ventricular hypertrophy. Left ventricular diastolic parameters are consistent with Grade II diastolic dysfunction (pseudonormalization). Right Ventricle: The right ventricular size is normal. No increase in right ventricular wall thickness. Right ventricular systolic function is normal. There is severely elevated pulmonary artery systolic pressure. The tricuspid regurgitant velocity is 4.65 m/s, and with an assumed right atrial pressure of 8 mmHg, the estimated right ventricular systolic pressure is 94.5 mmHg. Left Atrium: Left atrial size was severely dilated. Right Atrium: Right atrial size was mildly dilated. Pericardium: A moderately sized pericardial effusion is present. The pericardial effusion is  circumferential. Mitral Valve: The mitral valve is normal in structure. There is moderate thickening of the mitral valve leaflet(s). There is mild calcification of the mitral valve leaflet(s). Moderate mitral annular calcification. Moderate to severe mitral valve regurgitation. No evidence of mitral valve stenosis. MV peak gradient, 3.0 mmHg. The mean mitral valve gradient is 1.0 mmHg. Tricuspid Valve: The tricuspid valve is normal in structure. Tricuspid valve regurgitation is mild . No evidence of tricuspid stenosis. Aortic Valve: The aortic valve is  normal in structure. Aortic valve regurgitation is moderate to severe. Aortic regurgitation PHT measures 519 msec. Aortic valve sclerosis/calcification is present, without any evidence of aortic stenosis. Aortic valve mean gradient measures 6.0 mmHg. Aortic valve peak gradient measures 14.4 mmHg. Aortic valve area, by VTI measures 1.95 cm. Pulmonic Valve: The pulmonic valve was normal in structure. Pulmonic valve regurgitation is moderate. No evidence of pulmonic stenosis. Aorta: The aortic root is normal in size and structure. Pulmonary Artery: The pulmonary artery is severely dilated. Venous: The inferior vena cava is normal in size with less than 50% respiratory variability, suggesting right atrial pressure of 8 mmHg. IAS/Shunts: No atrial level shunt detected by color flow Doppler. Additional Comments: There is a small pleural effusion in the left lateral region.  LEFT VENTRICLE PLAX 2D LVIDd:         6.00 cm      Diastology LVIDs:         4.90 cm      LV e' medial:    4.13 cm/s LV PW:         1.30 cm      LV E/e' medial:  20.2 LV IVS:        1.40 cm      LV e' lateral:   5.44 cm/s LVOT diam:     1.90 cm      LV E/e' lateral: 15.3 LV SV:         79 LV SV Index:   52           2D Longitudinal Strain LVOT Area:     2.84 cm     2D Strain GLS Avg:     -13.2 %  LV Volumes (MOD) LV vol d, MOD A2C: 142.0 ml LV vol d, MOD A4C: 115.0 ml LV vol s, MOD A2C: 62.8 ml LV vol  s, MOD A4C: 70.5 ml LV SV MOD A2C:     79.2 ml LV SV MOD A4C:     115.0 ml LV SV MOD BP:      61.9 ml RIGHT VENTRICLE RV Basal diam:  3.90 cm RV Mid diam:    3.30 cm RV S prime:     14.80 cm/s TAPSE (M-mode): 1.9 cm LEFT ATRIUM              Index        RIGHT ATRIUM           Index LA diam:        4.80 cm  3.14 cm/m   RA Area:     18.90 cm LA Vol (A2C):   144.0 ml 94.29 ml/m  RA Volume:   55.80 ml  36.54 ml/m LA Vol (A4C):   121.0 ml 79.23 ml/m LA Biplane Vol: 138.0 ml 90.36 ml/m  AORTIC VALVE                     PULMONIC VALVE AV Area (Vmax):    2.10 cm      PV Vmax:       1.14 m/s AV Area (Vmean):   1.91 cm      PV Peak grad:  5.2 mmHg AV Area (VTI):     1.95 cm AV Vmax:           190.00 cm/s AV Vmean:          115.000 cm/s AV VTI:            0.407 m AV Peak Grad:  14.4 mmHg AV Mean Grad:      6.0 mmHg LVOT Vmax:         141.00 cm/s LVOT Vmean:        77.300 cm/s LVOT VTI:          0.280 m LVOT/AV VTI ratio: 0.69 AI PHT:            519 msec  AORTA Ao Root diam: 3.60 cm Ao Asc diam:  3.40 cm MITRAL VALVE                  TRICUSPID VALVE MV Area (PHT): 3.70 cm       TR Peak grad:   86.5 mmHg MV Area VTI:   3.36 cm       TR Vmax:        465.00 cm/s MV Peak grad:  3.0 mmHg MV Mean grad:  1.0 mmHg       SHUNTS MV Vmax:       0.87 m/s       Systemic VTI:  0.28 m MV Vmean:      56.8 cm/s      Systemic Diam: 1.90 cm MV Decel Time: 205 msec MR Peak grad:    138.3 mmHg MR Mean grad:    92.0 mmHg MR Vmax:         588.00 cm/s MR Vmean:        457.0 cm/s MR PISA:         2.26 cm MR PISA Eff ROA: 36 mm MR PISA Radius:  0.60 cm MV E velocity: 83.30 cm/s MV A velocity: 93.30 cm/s MV E/A ratio:  0.89 Lorine Bears MD Electronically signed by Lorine Bears MD Signature Date/Time: 10/22/2023/5:06:31 PM    Final    DG Chest Portable 1 View  Result Date: 10/22/2023 CLINICAL DATA:  Respiratory distress. EXAM: PORTABLE CHEST 1 VIEW COMPARISON:  08/03/2023 FINDINGS: Cardiac enlargement. Aortic atherosclerosis.  Small left pleural effusion is suspected. Diffuse increase interstitial markings identified bilaterally with bilateral patchy airspace opacities. There is advanced degenerative joint disease involving the glenohumeral and acromioclavicular joints. IMPRESSION: Cardiac enlargement, small left pleural effusion, increased interstitial markings and bilateral patchy airspace opacities compatible with moderate pulmonary edema. Electronically Signed   By: Signa Kell M.D.   On: 10/22/2023 07:00        Scheduled Meds:  amLODipine  10 mg Oral Daily   carvedilol  25 mg Oral BID WC   Chlorhexidine Gluconate Cloth  6 each Topical Q0600   cinacalcet  120 mg Oral Q supper   furosemide  80 mg Oral Once per day on Sunday Tuesday Thursday Saturday   heparin  5,000 Units Subcutaneous Q8H   hydrALAZINE  50 mg Oral Q8H   insulin aspart  0-6 Units Subcutaneous TID WC   losartan  50 mg Oral Daily   rosuvastatin  5 mg Oral Once per day on Sunday Tuesday Thursday Saturday   sevelamer carbonate  1,600 mg Oral TID WC   Continuous Infusions:  anticoagulant sodium citrate       LOS: 1 day      Charise Killian, MD [facility] Pager [phone]  If 7PM-7AM, please contact night-coverage [email or web address]  10/23/2023, 3:24 PM

## 2023-10-24 DIAGNOSIS — E8770 Fluid overload, unspecified: Secondary | ICD-10-CM | POA: Diagnosis not present

## 2023-10-24 LAB — CBC
HCT: 28.7 % — ABNORMAL LOW (ref 36.0–46.0)
Hemoglobin: 9.3 g/dL — ABNORMAL LOW (ref 12.0–15.0)
MCH: 28.4 pg (ref 26.0–34.0)
MCHC: 32.4 g/dL (ref 30.0–36.0)
MCV: 87.8 fL (ref 80.0–100.0)
Platelets: 186 10*3/uL (ref 150–400)
RBC: 3.27 MIL/uL — ABNORMAL LOW (ref 3.87–5.11)
RDW: 16.2 % — ABNORMAL HIGH (ref 11.5–15.5)
WBC: 4.6 10*3/uL (ref 4.0–10.5)
nRBC: 0 % (ref 0.0–0.2)

## 2023-10-24 LAB — RENAL FUNCTION PANEL
Albumin: 2.9 g/dL — ABNORMAL LOW (ref 3.5–5.0)
Anion gap: 12 (ref 5–15)
BUN: 61 mg/dL — ABNORMAL HIGH (ref 8–23)
CO2: 29 mmol/L (ref 22–32)
Calcium: 8.5 mg/dL — ABNORMAL LOW (ref 8.9–10.3)
Chloride: 94 mmol/L — ABNORMAL LOW (ref 98–111)
Creatinine, Ser: 7.91 mg/dL — ABNORMAL HIGH (ref 0.44–1.00)
GFR, Estimated: 5 mL/min — ABNORMAL LOW (ref >60)
Glucose, Bld: 100 mg/dL — ABNORMAL HIGH (ref 70–99)
Phosphorus: 5 mg/dL — ABNORMAL HIGH (ref 2.5–4.6)
Potassium: 4.2 mmol/L (ref 3.5–5.1)
Sodium: 135 mmol/L (ref 135–145)

## 2023-10-24 MED ORDER — PENTAFLUOROPROP-TETRAFLUOROETH EX AERO
INHALATION_SPRAY | CUTANEOUS | Status: AC
  Filled 2023-10-24: qty 30

## 2023-10-24 NOTE — Progress Notes (Signed)
  PROGRESS NOTE    Kristen Francis  WUJ:[id number] DOB: Dec 14, 1945 DOA: 10/22/2023 PCP: Sallyanne Kuster, NP  [id number]  LOS: 2 days   Brief hospital course:   Assessment & Plan: Kristen Francis is a 77 y.o. female with medical history significant of ESRD, HTN, CVA, GERD presenting with acute respiratory failure with hypoxia, volume overload.  Patient reports acute onset of shortness of breath around 2 AM.  Worsening orthopnea, PND.  Baseline ESRD on hemodialysis Monday Wednesday Friday.  Denies any missed episodes of dialysis.   Pulmonary edema: as per CXR. Improved after urgent HD yesterday. Echo showed reduced EF 35-40%, moderate pericardial effusion, grade II diastolic dysfunction, severe hypokinesis of LV, LA is severely dilated, RA is mildly dilated, mod-sever MR & severely dilated pulmon artery. Nephro following and recs apprec. Cardio consulted   Acute combined CHF exacerbation: no previous hx of CHF as per pt. Cardio consulted --right and left heart cath tomorrow --Continue on coreg, losartan, lasix.   Moderate pericardial effusion: etiology unclear. As per echo. Likely contributed to pt's dyspnea. Cardio consulted   Acute hypoxemic respiratory failure likely secondary to all above.  --needed BiPAP on presentation.   --Continue supplemental O2 to keep sats >=92%, wean as tolerated   ESRD on HD MWF.  --iHD per nephro   HTN: continue on coreg, losartan, hydralazine, lasix   DM2: well controlled, 4.6. Does not need SSI.    Hx of CVA: continue on aspirin, statin    ACD: likely secondary to ESRD. No need for transfuse currently     DVT prophylaxis: Heparin SQ Code Status: Full code  Family Communication:  Level of care: Progressive Dispo:   The patient is from: home Anticipated d/c is to: home Anticipated d/c date is: 1-2 days   Subjective and Interval History:  Dyspnea improved.   Objective: Vitals:   10/24/23 1700 10/24/23 1730 10/24/23 1739  10/24/23 1743  BP: (!) 159/72  (!) 159/74   Pulse: 65  68   Resp: 15  17   Temp:   97.9 F (36.6 C)   TempSrc:   Oral   SpO2: 100% 100% 100%   Weight:    46.2 kg  Height:        Intake/Output Summary (Last 24 hours) at 10/24/2023 1747 Last data filed at 10/24/2023 1739 Gross per 24 hour  Intake 240 ml  Output 1500 ml  Net -1260 ml   Filed Weights   10/24/23 1346 10/24/23 1743  Weight: 47.7 kg 46.2 kg    Examination:   Constitutional: NAD, AAOx3 HEENT: conjunctivae and lids normal, EOMI CV: No cyanosis.   RESP: normal respiratory effort Extremities: No effusions, edema in BLE SKIN: warm, dry Neuro: II - XII grossly intact.   Psych: Normal mood and affect.  Appropriate judgement and reason   Data Reviewed: I have personally reviewed labs and imaging studies  Time spent: 50 minutes  Darlin Priestly, MD [facility] If 7PM-7AM, please contact night-coverage 10/24/2023, 5:47 PM

## 2023-10-24 NOTE — Progress Notes (Signed)
[facility]  ROUNDING NOTE   Subjective:   Patient well-known to [place] from prior admissions. Normally goes to [facility]. Last treatment prior to admission was 10/19/2023.  Update:  Patient's respiratory status much improved as compared to admission. Due for dialysis treatment later today.    Objective:  Vital signs in last 24 hours:  Temp:  [97.9 F (36.6 C)-98.5 F (36.9 C)] 97.9 F (36.6 C) (11/20 0825) Pulse Rate:  [61-72] 61 (11/20 0825) Resp:  [15-27] 16 (11/20 0825) BP: (140-160)/(67-80) 144/70 (11/20 0825) SpO2:  [99 %-100 %] 100 % (11/20 0825)  Weight change:  There were no vitals filed for this visit.  Intake/Output: I/O last 3 completed shifts: In: 240 [P.O.:240] Out: -    Intake/Output this shift:  No intake/output data recorded.  Physical Exam: General: No acute distress  Head: Normocephalic, atraumatic. Moist oral mucosal membranes  Neck: Supple  Lungs:  Bibasilar minimal Rales  Heart: S1S2 no rubs  Abdomen:  Soft, nontender, bowel sounds present  Extremities: Trace peripheral edema.  Neurologic: Awake, alert, following commands  Skin: No acute rash  Access: Left upper extremity AV fistula    Basic Metabolic Panel: Recent Labs  Lab 10/22/23 0600 10/22/23 0955 10/23/23 1953  NA 140  --  134*  K 4.1 3.9 3.9  CL 95*  --  94*  CO2 27  --  30  GLUCOSE 143*  --  108*  BUN 53*  --  48*  CREATININE 7.10*  --  6.48*  CALCIUM 9.1  --  8.4*    Liver Function Tests: Recent Labs  Lab 10/23/23 1953  AST 13*  ALT 8  ALKPHOS 32*  BILITOT 0.7  PROT 5.9*  ALBUMIN 3.1*   No results for input(s): "LIPASE", "AMYLASE" in the last 168 hours. No results for input(s): "AMMONIA" in the last 168 hours.  CBC: Recent Labs  Lab 10/22/23 0600 10/23/23 1953  WBC 9.8 4.3  HGB 10.7* 9.3*  HCT 33.9* 29.0*  MCV 90.4 87.9  PLT 204 198    Cardiac Enzymes: No results for input(s): "CKTOTAL", "CKMB", "CKMBINDEX",  "TROPONINI" in the last 168 hours.  BNP: Invalid input(s): "POCBNP"  CBG: Recent Labs  Lab 10/22/23 1158 10/22/23 1732 10/23/23 0058 10/23/23 0750 10/23/23 1152  GLUCAP 70 118* 92 126* 107*    Microbiology: Results for orders placed or performed during the hospital encounter of 10/22/23  MRSA Next Gen by PCR, Nasal     Status: None   Collection Time: 10/22/23  9:55 AM   Specimen: Nasal Mucosa; Nasal Swab  Result Value Ref Range Status   MRSA by PCR Next Gen NOT DETECTED NOT DETECTED Final    Comment: (NOTE) The GeneXpert MRSA Assay (FDA approved for NASAL specimens only), is one component of a comprehensive MRSA colonization surveillance program. It is not intended to diagnose MRSA infection nor to guide or monitor treatment for MRSA infections. Test performance is not FDA approved in patients less than 65 years old. Performed at [facility], [address] [place], [place] [address]     Coagulation Studies: No results for input(s): "LABPROT", "INR" in the last 72 hours.  Urinalysis: No results for input(s): "COLORURINE", "LABSPEC", "PHURINE", "GLUCOSEU", "HGBUR", "BILIRUBINUR", "KETONESUR", "PROTEINUR", "UROBILINOGEN", "NITRITE", "LEUKOCYTESUR" in the last 72 hours.  Invalid input(s): "APPERANCEUR"    Imaging: ECHOCARDIOGRAM COMPLETE  Result Date: 10/22/2023    ECHOCARDIOGRAM REPORT   Patient Name:   Kristen Francis Sudduth Date of Exam: 10/22/2023 Medical  Rec #:  [id number]            Height:       63.0 in Accession #:    [id number]           Weight:       114.8 lb Date of Birth:  [birth year]            BSA:          1.527 m Patient Age:    77 years             BP:           155/62 mmHg Patient Gender: F                    HR:           79 bpm. Exam Location:  [facility] Procedure: 2D Echo, Cardiac Doppler, Color Doppler, 3D Echo and Strain Analysis Indications:     Acute respiratory distress  History:         Patient has no prior history of Echocardiogram  examinations.                  Stroke and TIA, Signs/Symptoms:Fatigue and Shortness of Breath;                  Risk Factors:Hypertension and Diabetes. CKD.  Sonographer:     Mikki Harbor Referring Phys:  [id number] Francoise Schaumann [place] Diagnosing Phys: Lorine Bears MD  Sonographer Comments: Global longitudinal strain was attempted. IMPRESSIONS  1. Left ventricular ejection fraction, by estimation, is 35 to 40%. The left ventricle has moderately decreased function. The left ventricle demonstrates regional wall motion abnormalities (see scoring diagram/findings for description). The left ventricular internal cavity size was moderately dilated. There is moderate left ventricular hypertrophy. Left ventricular diastolic parameters are consistent with Grade II diastolic dysfunction (pseudonormalization). There is severe hypokinesis of the left ventricular, inferior wall and inferolateral wall. The average left ventricular global longitudinal strain is -13.2 %. The global longitudinal strain is abnormal.  2. Right ventricular systolic function is normal. The right ventricular size is normal. There is severely elevated pulmonary artery systolic pressure. The estimated right ventricular systolic pressure is 94.5 mmHg.  3. Left atrial size was severely dilated.  4. Right atrial size was mildly dilated.  5. Moderate pericardial effusion. The pericardial effusion is circumferential.  6. The mitral valve is normal in structure. Moderate to severe mitral valve regurgitation. No evidence of mitral stenosis. Moderate mitral annular calcification.  7. The aortic valve is normal in structure. Aortic valve regurgitation is moderate to severe. Aortic valve sclerosis/calcification is present, without any evidence of aortic stenosis.  8. Pulmonic valve regurgitation is moderate.  9. Severely dilated pulmonary artery. 10. The inferior vena cava is normal in size with <50% respiratory variability, suggesting right atrial pressure of 8 mmHg.  FINDINGS  Left Ventricle: Left ventricular ejection fraction, by estimation, is 35 to 40%. The left ventricle has moderately decreased function. The left ventricle demonstrates regional wall motion abnormalities. The average left ventricular global longitudinal strain is -13.2 %. The global longitudinal strain is abnormal. The left ventricular internal cavity size was moderately dilated. There is moderate left ventricular hypertrophy. Left ventricular diastolic parameters are consistent with Grade II diastolic dysfunction (pseudonormalization). Right Ventricle: The right ventricular size is normal. No increase in right ventricular wall thickness. Right ventricular systolic function is normal. There is severely elevated pulmonary artery systolic pressure. The tricuspid regurgitant velocity is 4.65  m/s, and with an assumed right atrial pressure of 8 mmHg, the estimated right ventricular systolic pressure is 94.5 mmHg. Left Atrium: Left atrial size was severely dilated. Right Atrium: Right atrial size was mildly dilated. Pericardium: A moderately sized pericardial effusion is present. The pericardial effusion is circumferential. Mitral Valve: The mitral valve is normal in structure. There is moderate thickening of the mitral valve leaflet(s). There is mild calcification of the mitral valve leaflet(s). Moderate mitral annular calcification. Moderate to severe mitral valve regurgitation. No evidence of mitral valve stenosis. MV peak gradient, 3.0 mmHg. The mean mitral valve gradient is 1.0 mmHg. Tricuspid Valve: The tricuspid valve is normal in structure. Tricuspid valve regurgitation is mild . No evidence of tricuspid stenosis. Aortic Valve: The aortic valve is normal in structure. Aortic valve regurgitation is moderate to severe. Aortic regurgitation PHT measures 519 msec. Aortic valve sclerosis/calcification is present, without any evidence of aortic stenosis. Aortic valve mean gradient measures 6.0 mmHg. Aortic valve  peak gradient measures 14.4 mmHg. Aortic valve area, by VTI measures 1.95 cm. Pulmonic Valve: The pulmonic valve was normal in structure. Pulmonic valve regurgitation is moderate. No evidence of pulmonic stenosis. Aorta: The aortic root is normal in size and structure. Pulmonary Artery: The pulmonary artery is severely dilated. Venous: The inferior vena cava is normal in size with less than 50% respiratory variability, suggesting right atrial pressure of 8 mmHg. IAS/Shunts: No atrial level shunt detected by color flow Doppler. Additional Comments: There is a small pleural effusion in the left lateral region.  LEFT VENTRICLE PLAX 2D LVIDd:         6.00 cm      Diastology LVIDs:         4.90 cm      LV e' medial:    4.13 cm/s LV PW:         1.30 cm      LV E/e' medial:  20.2 LV IVS:        1.40 cm      LV e' lateral:   5.44 cm/s LVOT diam:     1.90 cm      LV E/e' lateral: 15.3 LV SV:         79 LV SV Index:   52           2D Longitudinal Strain LVOT Area:     2.84 cm     2D Strain GLS Avg:     -13.2 %  LV Volumes (MOD) LV vol d, MOD A2C: 142.0 ml LV vol d, MOD A4C: 115.0 ml LV vol s, MOD A2C: 62.8 ml LV vol s, MOD A4C: 70.5 ml LV SV MOD A2C:     79.2 ml LV SV MOD A4C:     115.0 ml LV SV MOD BP:      61.9 ml RIGHT VENTRICLE RV Basal diam:  3.90 cm RV Mid diam:    3.30 cm RV S prime:     14.80 cm/s TAPSE (M-mode): 1.9 cm LEFT ATRIUM              Index        RIGHT ATRIUM           Index LA diam:        4.80 cm  3.14 cm/m   RA Area:     18.90 cm LA Vol (A2C):   144.0 ml 94.29 ml/m  RA Volume:   55.80 ml  36.54 ml/m LA Vol (A4C):   121.0  ml 79.23 ml/m LA Biplane Vol: 138.0 ml 90.36 ml/m  AORTIC VALVE                     PULMONIC VALVE AV Area (Vmax):    2.10 cm      PV Vmax:       1.14 m/s AV Area (Vmean):   1.91 cm      PV Peak grad:  5.2 mmHg AV Area (VTI):     1.95 cm AV Vmax:           190.00 cm/s AV Vmean:          115.000 cm/s AV VTI:            0.407 m AV Peak Grad:      14.4 mmHg AV Mean Grad:      6.0  mmHg LVOT Vmax:         141.00 cm/s LVOT Vmean:        77.300 cm/s LVOT VTI:          0.280 m LVOT/AV VTI ratio: 0.69 AI PHT:            519 msec  AORTA Ao Root diam: 3.60 cm Ao Asc diam:  3.40 cm MITRAL VALVE                  TRICUSPID VALVE MV Area (PHT): 3.70 cm       TR Peak grad:   86.5 mmHg MV Area VTI:   3.36 cm       TR Vmax:        465.00 cm/s MV Peak grad:  3.0 mmHg MV Mean grad:  1.0 mmHg       SHUNTS MV Vmax:       0.87 m/s       Systemic VTI:  0.28 m MV Vmean:      56.8 cm/s      Systemic Diam: 1.90 cm MV Decel Time: 205 msec MR Peak grad:    138.3 mmHg MR Mean grad:    92.0 mmHg MR Vmax:         588.00 cm/s MR Vmean:        457.0 cm/s MR PISA:         2.26 cm MR PISA Eff ROA: 36 mm MR PISA Radius:  0.60 cm MV E velocity: 83.30 cm/s MV A velocity: 93.30 cm/s MV E/A ratio:  0.89 Lorine Bears MD Electronically signed by Lorine Bears MD Signature Date/Time: 10/22/2023/5:06:31 PM    Final      Medications:    anticoagulant sodium citrate      aspirin EC  81 mg Oral Daily   carvedilol  25 mg Oral BID WC   Chlorhexidine Gluconate Cloth  6 each Topical Q0600   cinacalcet  120 mg Oral Q supper   furosemide  80 mg Oral Once per day on Sunday Tuesday Thursday Saturday   heparin  5,000 Units Subcutaneous Q8H   hydrALAZINE  50 mg Oral Q8H   losartan  50 mg Oral Daily   rosuvastatin  5 mg Oral Once per day on Sunday Tuesday Thursday Saturday   sevelamer carbonate  1,600 mg Oral TID WC   alteplase, anticoagulant sodium citrate, feeding supplement (NEPRO CARB STEADY), heparin, heparin, lidocaine (PF), lidocaine-prilocaine, ondansetron **OR** ondansetron (ZOFRAN) IV, mouth rinse, pentafluoroprop-tetrafluoroeth  Assessment/ Plan:  77 y.o. female with past medical history of ESRD on HD, hypertension, hyperlipidemia, CVA, macular degeneration who was admitted  for acute respiratory failure requiring BiPAP.  [facility]/MWF/[facility]/left upper extremity AV fistula  1.  ESRD on  HD.  Patient due for hemodialysis treatment today.  Orders have been prepared.  She will be due for dialysis treatment later this afternoon.  2.  Acute respiratory failure/acute systolic and diastolic heart failure with ejection fraction of 35 to 40%..  As compared to admission patient doing much better.  Breathing comfortably at the moment.  3.  Anemia of chronic kidney disease.   Lab Results  Component Value Date   HGB 9.3 (L) 10/23/2023  Hold off on Epogen for now.   4.  Secondary hyperparathyroidism.   Lab Results  Component Value Date   CALCIUM 8.4 (L) 10/23/2023   PHOS 2.8 (L) 07/02/2020  Monitor bone metabolism parameters over the course of the hospitalization.    LOS: 2 Ancil Dewan 11/20/20249:42 AM

## 2023-10-24 NOTE — Progress Notes (Signed)
  Received patient in bed to unit.   Informed consent signed and in chart.    TX duration: 3.5hrs     Transported back to floor  Hand-off given to patient's nurse. No c/o and no distress noted    Access used: L AVF Access issues: none   Total UF removed: 1.5L Medication(s) given: none Post HD VS:  159/74 Post HD weight: 46.2kg     Lynann Beaver  [facility]

## 2023-10-24 NOTE — Plan of Care (Signed)

## 2023-10-24 NOTE — Progress Notes (Signed)
[facility] CLINIC CARDIOLOGY PROGRESS NOTE       Patient ID: Kristen Francis MRN: [id number] DOB/AGE: October 29, 1946 77 y.o.  Admit date: 10/22/2023 Referring Physician Fabienne Bruns, MD Primary Physician Sallyanne Kuster, NP Primary Cardiologist Windell Norfolk, MD  Reason for Consultation AoCHF, moderate pericardial effusion  HPI: Kristen Francis is a 77 y.o. female  with a past medical history of ESRD on HD (AV fistula on left upper arm; M/W/F), hypertension, hyperlipidemia, CVA, GERD, anemia who presented to the ED on 10/22/2023 for respiratory distress. Cardiology was consulted for further evaluation.   Interval history: -Patient reports she feels well this AM, states she slept well. Denies CP, palpitations. -SOB improved today per patient. Remains on supplemental O2.  -BP improved, HR stable.  -Will be going for dialysis today.   Review of systems complete and found to be negative unless listed above    Past Medical History:  Diagnosis Date   Anemia    Arthritis    Chronic kidney disease    ESRD   Complication of anesthesia    had procedure and felt incision early 80s   Gout    Hyperlipemia    Hypertension    Macular degeneration, right eye    Stroke [facility])     Past Surgical History:  Procedure Laterality Date   A/V FISTULAGRAM Left 12/25/2018   Procedure: A/V FISTULAGRAM;  Surgeon: Renford Dills, MD;  Location: [facility] INVASIVE CV LAB;  Service: Cardiovascular;  Laterality: Left;   A/V FISTULAGRAM Left 10/09/2023   Procedure: A/V Fistulagram;  Surgeon: Renford Dills, MD;  Location: [facility] INVASIVE CV LAB;  Service: Cardiovascular;  Laterality: Left;   ABDOMINAL HYSTERECTOMY     APPENDECTOMY     AV FISTULA PLACEMENT Left 07/12/2018   Procedure: INSERTION OF ARTERIOVENOUS (AV) GORE-TEX GRAFT ARM ( BRACHIAL AXILLARY );  Surgeon: Renford Dills, MD;  Location: [facility] ORS;  Service: Vascular;  Laterality: Left;   COLONOSCOPY WITH PROPOFOL N/A 01/14/2016    Procedure: COLONOSCOPY WITH PROPOFOL;  Surgeon: Scot Jun, MD;  Location: [facility] ENDOSCOPY;  Service: Endoscopy;  Laterality: N/A;   COLONOSCOPY WITH PROPOFOL N/A 02/14/2022   Procedure: COLONOSCOPY WITH PROPOFOL;  Surgeon: Regis Bill, MD;  Location: [facility] ENDOSCOPY;  Service: Endoscopy;  Laterality: N/A;   TONSILLECTOMY      Medications Prior to Admission  Medication Sig Dispense Refill Last Dose   amLODipine (NORVASC) 10 MG tablet TAKE 1 TABLET(10 MG) BY MOUTH DAILY 90 tablet 3 10/21/2023 at Unknown   aspirin EC 81 MG EC tablet Take 1 tablet (81 mg total) by mouth daily. Swallow whole. 30 tablet 0    B Complex-C-Folic Acid (RENAL-VITE) 0.8 MG TABS Take 0.8 mg by mouth every evening. On dialysis days      benzonatate (TESSALON) 100 MG capsule Take 1 capsule (100 mg total) by mouth 2 (two) times daily as needed for cough. 60 capsule 3 Unknown at PRN   carvedilol (COREG) 25 MG tablet TAKE 1 TABLET(25 MG) BY MOUTH TWICE DAILY WITH A MEAL 180 tablet 3 10/21/2023 at Unknown   cholecalciferol (VITAMIN D) 1000 units tablet Take 1,000 Units by mouth daily.       cinacalcet (SENSIPAR) 60 MG tablet Take 120 mg by mouth daily with supper.   10/21/2023 at 1800   ferrous sulfate 325 (65 FE) MG tablet Take 1 tablet (325 mg total) by mouth 2 (two) times daily with a meal. 60 tablet 2    furosemide (LASIX) 80 MG tablet  Take 1 tablet (80 mg total) by mouth 4 (four) times a week. On non-dialysis days 16 tablet 2 10/21/2023 at Unknown   hydrALAZINE (APRESOLINE) 50 MG tablet TAKE 1 TABLET(50 MG) BY MOUTH THREE TIMES DAILY. 90 tablet 2 10/21/2023 at Unknown   lidocaine-prilocaine (EMLA) cream Apply 1 Application topically as directed.  6    losartan (COZAAR) 50 MG tablet TAKE 1 TABLET(50 MG) BY MOUTH DAILY 90 tablet 3 10/21/2023 at Unknown   omeprazole (PRILOSEC) 40 MG capsule TAKE 1 CAPSULE(40 MG) BY MOUTH DAILY 90 capsule 3    rosuvastatin (CRESTOR) 5 MG tablet Take 5 mg by mouth See admin  instructions. Take 1 tablet (5mg ) by mouth on non-dialysis days   10/21/2023 at Unknown   sennosides-docusate sodium (SENOKOT-S) 8.6-50 MG tablet Take 2 tablets by mouth daily.      sevelamer carbonate (RENVELA) 800 MG tablet Take 1,600 mg by mouth 3 (three) times daily with meals.   10/21/2023 at 1800   dronabinol (MARINOL) 5 MG capsule Take 1 capsule by mouth before meals twice daily on non-dialysis days 40 capsule 2    HYDROcodone-acetaminophen (NORCO/VICODIN) 5-325 MG tablet Take 1 tablet by mouth every 4 (four) hours as needed for moderate pain. (Patient not taking: Reported on 10/22/2023) 10 tablet 0 Not Taking   metoCLOPramide (REGLAN) 5 MG tablet Take 1 tablet (5 mg total) by mouth 4 (four) times daily -  before meals and at bedtime. (Patient not taking: Reported on 10/22/2023) 120 tablet 1 Not Taking   predniSONE (DELTASONE) 50 MG tablet Take 1 tablet (50 mg total) by mouth daily with breakfast. (Patient not taking: Reported on 10/22/2023) 5 tablet 0 Not Taking   Social History   Socioeconomic History   Marital status: Married    Spouse name: Not on file   Number of children: Not on file   Years of education: Not on file   Highest education level: Not on file  Occupational History   Not on file  Tobacco Use   Smoking status: Former   Smokeless tobacco: Never  Vaping Use   Vaping status: Never Used  Substance and Sexual Activity   Alcohol use: No   Drug use: No   Sexual activity: Not on file  Other Topics Concern   Not on file  Social History Narrative   Lives in  [place]; used to clean; no smoking; no alcohol.    Social Determinants of Health   Financial Resource Strain: Not on file  Food Insecurity: No Food Insecurity (10/22/2023)   Hunger Vital Sign    Worried About Running Out of Food in the Last Year: Never true    Ran Out of Food in the Last Year: Never true  Transportation Needs: No Transportation Needs (10/22/2023)   PRAPARE - Scientist, research (physical sciences) (Medical): No    Lack of Transportation (Non-Medical): No  Physical Activity: Not on file  Stress: Not on file  Social Connections: Not on file  Intimate Partner Violence: Not At Risk (10/22/2023)   Humiliation, Afraid, Rape, and Kick questionnaire    Fear of Current or Ex-Partner: No    Emotionally Abused: No    Physically Abused: No    Sexually Abused: No    Family History  Problem Relation Age of Onset   Kidney disease Mother    Breast cancer Neg Hx      Vitals:   10/23/23 2000 10/23/23 2350 10/24/23 0509 10/24/23 0825  BP: (!) 153/69 (!) 148/76 Marland Kitchen)  153/72 (!) 144/70  Pulse: 71 71 65 61  Resp: 18 16 16 16   Temp: 98.2 F (36.8 C) 98.2 F (36.8 C) 98.2 F (36.8 C) 97.9 F (36.6 C)  TempSrc: Oral Oral Oral   SpO2: 100% 100% 100% 100%  Height:        PHYSICAL EXAM General: Chronically-ill appearing elderly female, well nourished, in no acute distress laying nearly flat in hospital bed. HEENT: Normocephalic and atraumatic. Neck: No JVD.   Lungs: Normal respiratory effort on 4L [place] (baseline RA).  Mild crackles bilaterally. Heart: HRRR. Normal S1 and S2 without gallops or murmurs.  Abdomen: Non-distended appearing.  Msk: Normal strength and tone for age. Extremities: Warm and well perfused. No clubbing, cyanosis. No edema.  Neuro: Alert and oriented X 3. Psych: Answers questions appropriately.   Labs: Basic Metabolic Panel: Recent Labs    10/22/23 0600 10/22/23 0955 10/23/23 1953  NA 140  --  134*  K 4.1 3.9 3.9  CL 95*  --  94*  CO2 27  --  30  GLUCOSE 143*  --  108*  BUN 53*  --  48*  CREATININE 7.10*  --  6.48*  CALCIUM 9.1  --  8.4*   Liver Function Tests: Recent Labs    10/23/23 1953  AST 13*  ALT 8  ALKPHOS 32*  BILITOT 0.7  PROT 5.9*  ALBUMIN 3.1*   No results for input(s): "LIPASE", "AMYLASE" in the last 72 hours. CBC: Recent Labs    10/22/23 0600 10/23/23 1953  WBC 9.8 4.3  HGB 10.7* 9.3*  HCT 33.9* 29.0*  MCV 90.4  87.9  PLT 204 198   Cardiac Enzymes: Recent Labs    10/22/23 0600 10/22/23 0955  TROPONINIHS 31* 46*   BNP: Recent Labs    10/22/23 0600  BNP >4,500.0*   D-Dimer: No results for input(s): "DDIMER" in the last 72 hours. Hemoglobin A1C: Recent Labs    10/22/23 0955  HGBA1C 4.6*   Fasting Lipid Panel: No results for input(s): "CHOL", "HDL", "LDLCALC", "TRIG", "CHOLHDL", "LDLDIRECT" in the last 72 hours. Thyroid Function Tests: No results for input(s): "TSH", "T4TOTAL", "T3FREE", "THYROIDAB" in the last 72 hours.  Invalid input(s): "FREET3" Anemia Panel: No results for input(s): "VITAMINB12", "FOLATE", "FERRITIN", "TIBC", "IRON", "RETICCTPCT" in the last 72 hours.   Radiology: ECHOCARDIOGRAM COMPLETE  Result Date: 10/22/2023    ECHOCARDIOGRAM REPORT   Patient Name:   KRYSTALIN MONCION Garfinkel Date of Exam: 10/22/2023 Medical Rec #:  [id number]            Height:       63.0 in Accession #:    [id number]           Weight:       114.8 lb Date of Birth:  [birth year]            BSA:          1.527 m Patient Age:    77 years             BP:           155/62 mmHg Patient Gender: F                    HR:           79 bpm. Exam Location:  [facility] Procedure: 2D Echo, Cardiac Doppler, Color Doppler, 3D Echo and Strain Analysis Indications:     Acute respiratory distress  History:  Patient has no prior history of Echocardiogram examinations.                  Stroke and TIA, Signs/Symptoms:Fatigue and Shortness of Breath;                  Risk Factors:Hypertension and Diabetes. CKD.  Sonographer:     Mikki Harbor Referring Phys:  [phone] Francoise Schaumann NEWTON Diagnosing Phys: Lorine Bears MD  Sonographer Comments: Global longitudinal strain was attempted. IMPRESSIONS  1. Left ventricular ejection fraction, by estimation, is 35 to 40%. The left ventricle has moderately decreased function. The left ventricle demonstrates regional wall motion abnormalities (see scoring diagram/findings for description).  The left ventricular internal cavity size was moderately dilated. There is moderate left ventricular hypertrophy. Left ventricular diastolic parameters are consistent with Grade II diastolic dysfunction (pseudonormalization). There is severe hypokinesis of the left ventricular, inferior wall and inferolateral wall. The average left ventricular global longitudinal strain is -13.2 %. The global longitudinal strain is abnormal.  2. Right ventricular systolic function is normal. The right ventricular size is normal. There is severely elevated pulmonary artery systolic pressure. The estimated right ventricular systolic pressure is 94.5 mmHg.  3. Left atrial size was severely dilated.  4. Right atrial size was mildly dilated.  5. Moderate pericardial effusion. The pericardial effusion is circumferential.  6. The mitral valve is normal in structure. Moderate to severe mitral valve regurgitation. No evidence of mitral stenosis. Moderate mitral annular calcification.  7. The aortic valve is normal in structure. Aortic valve regurgitation is moderate to severe. Aortic valve sclerosis/calcification is present, without any evidence of aortic stenosis.  8. Pulmonic valve regurgitation is moderate.  9. Severely dilated pulmonary artery. 10. The inferior vena cava is normal in size with <50% respiratory variability, suggesting right atrial pressure of 8 mmHg. FINDINGS  Left Ventricle: Left ventricular ejection fraction, by estimation, is 35 to 40%. The left ventricle has moderately decreased function. The left ventricle demonstrates regional wall motion abnormalities. The average left ventricular global longitudinal strain is -13.2 %. The global longitudinal strain is abnormal. The left ventricular internal cavity size was moderately dilated. There is moderate left ventricular hypertrophy. Left ventricular diastolic parameters are consistent with Grade II diastolic dysfunction (pseudonormalization). Right Ventricle: The right  ventricular size is normal. No increase in right ventricular wall thickness. Right ventricular systolic function is normal. There is severely elevated pulmonary artery systolic pressure. The tricuspid regurgitant velocity is 4.65 m/s, and with an assumed right atrial pressure of 8 mmHg, the estimated right ventricular systolic pressure is 94.5 mmHg. Left Atrium: Left atrial size was severely dilated. Right Atrium: Right atrial size was mildly dilated. Pericardium: A moderately sized pericardial effusion is present. The pericardial effusion is circumferential. Mitral Valve: The mitral valve is normal in structure. There is moderate thickening of the mitral valve leaflet(s). There is mild calcification of the mitral valve leaflet(s). Moderate mitral annular calcification. Moderate to severe mitral valve regurgitation. No evidence of mitral valve stenosis. MV peak gradient, 3.0 mmHg. The mean mitral valve gradient is 1.0 mmHg. Tricuspid Valve: The tricuspid valve is normal in structure. Tricuspid valve regurgitation is mild . No evidence of tricuspid stenosis. Aortic Valve: The aortic valve is normal in structure. Aortic valve regurgitation is moderate to severe. Aortic regurgitation PHT measures 519 msec. Aortic valve sclerosis/calcification is present, without any evidence of aortic stenosis. Aortic valve mean gradient measures 6.0 mmHg. Aortic valve peak gradient measures 14.4 mmHg. Aortic valve area, by VTI measures  1.95 cm. Pulmonic Valve: The pulmonic valve was normal in structure. Pulmonic valve regurgitation is moderate. No evidence of pulmonic stenosis. Aorta: The aortic root is normal in size and structure. Pulmonary Artery: The pulmonary artery is severely dilated. Venous: The inferior vena cava is normal in size with less than 50% respiratory variability, suggesting right atrial pressure of 8 mmHg. IAS/Shunts: No atrial level shunt detected by color flow Doppler. Additional Comments: There is a small  pleural effusion in the left lateral region.  LEFT VENTRICLE PLAX 2D LVIDd:         6.00 cm      Diastology LVIDs:         4.90 cm      LV e' medial:    4.13 cm/s LV PW:         1.30 cm      LV E/e' medial:  20.2 LV IVS:        1.40 cm      LV e' lateral:   5.44 cm/s LVOT diam:     1.90 cm      LV E/e' lateral: 15.3 LV SV:         79 LV SV Index:   52           2D Longitudinal Strain LVOT Area:     2.84 cm     2D Strain GLS Avg:     -13.2 %  LV Volumes (MOD) LV vol d, MOD A2C: 142.0 ml LV vol d, MOD A4C: 115.0 ml LV vol s, MOD A2C: 62.8 ml LV vol s, MOD A4C: 70.5 ml LV SV MOD A2C:     79.2 ml LV SV MOD A4C:     115.0 ml LV SV MOD BP:      61.9 ml RIGHT VENTRICLE RV Basal diam:  3.90 cm RV Mid diam:    3.30 cm RV S prime:     14.80 cm/s TAPSE (M-mode): 1.9 cm LEFT ATRIUM              Index        RIGHT ATRIUM           Index LA diam:        4.80 cm  3.14 cm/m   RA Area:     18.90 cm LA Vol (A2C):   144.0 ml 94.29 ml/m  RA Volume:   55.80 ml  36.54 ml/m LA Vol (A4C):   121.0 ml 79.23 ml/m LA Biplane Vol: 138.0 ml 90.36 ml/m  AORTIC VALVE                     PULMONIC VALVE AV Area (Vmax):    2.10 cm      PV Vmax:       1.14 m/s AV Area (Vmean):   1.91 cm      PV Peak grad:  5.2 mmHg AV Area (VTI):     1.95 cm AV Vmax:           190.00 cm/s AV Vmean:          115.000 cm/s AV VTI:            0.407 m AV Peak Grad:      14.4 mmHg AV Mean Grad:      6.0 mmHg LVOT Vmax:         141.00 cm/s LVOT Vmean:        77.300 cm/s LVOT VTI:  0.280 m LVOT/AV VTI ratio: 0.69 AI PHT:            519 msec  AORTA Ao Root diam: 3.60 cm Ao Asc diam:  3.40 cm MITRAL VALVE                  TRICUSPID VALVE MV Area (PHT): 3.70 cm       TR Peak grad:   86.5 mmHg MV Area VTI:   3.36 cm       TR Vmax:        465.00 cm/s MV Peak grad:  3.0 mmHg MV Mean grad:  1.0 mmHg       SHUNTS MV Vmax:       0.87 m/s       Systemic VTI:  0.28 m MV Vmean:      56.8 cm/s      Systemic Diam: 1.90 cm MV Decel Time: 205 msec MR Peak grad:    138.3  mmHg MR Mean grad:    92.0 mmHg MR Vmax:         588.00 cm/s MR Vmean:        457.0 cm/s MR PISA:         2.26 cm MR PISA Eff ROA: 36 mm MR PISA Radius:  0.60 cm MV E velocity: 83.30 cm/s MV A velocity: 93.30 cm/s MV E/A ratio:  0.89 Lorine Bears MD Electronically signed by Lorine Bears MD Signature Date/Time: 10/22/2023/5:06:31 PM    Final    DG Chest Portable 1 View  Result Date: 10/22/2023 CLINICAL DATA:  Respiratory distress. EXAM: PORTABLE CHEST 1 VIEW COMPARISON:  08/03/2023 FINDINGS: Cardiac enlargement. Aortic atherosclerosis. Small left pleural effusion is suspected. Diffuse increase interstitial markings identified bilaterally with bilateral patchy airspace opacities. There is advanced degenerative joint disease involving the glenohumeral and acromioclavicular joints. IMPRESSION: Cardiac enlargement, small left pleural effusion, increased interstitial markings and bilateral patchy airspace opacities compatible with moderate pulmonary edema. Electronically Signed   By: Signa Kell M.D.   On: 10/22/2023 07:00   MM 3D SCREENING MAMMOGRAM BILATERAL BREAST  Result Date: 10/19/2023 CLINICAL DATA:  Screening. EXAM: DIGITAL SCREENING BILATERAL MAMMOGRAM WITH TOMOSYNTHESIS AND CAD TECHNIQUE: Bilateral screening digital craniocaudal and mediolateral oblique mammograms were obtained. Bilateral screening digital breast tomosynthesis was performed. The images were evaluated with computer-aided detection. COMPARISON:  Previous exam(s). ACR Breast Density Category c: The breasts are heterogeneously dense, which may obscure small masses. FINDINGS: There are no findings suspicious for malignancy. IMPRESSION: No mammographic evidence of malignancy. A result letter of this screening mammogram will be mailed directly to the patient. RECOMMENDATION: Screening mammogram in one year. (Code:SM-B-01Y) BI-RADS CATEGORY  1: Negative. Electronically Signed   By: Elberta Fortis M.D.   On: 10/19/2023 10:33    PERIPHERAL VASCULAR CATHETERIZATION  Result Date: 10/09/2023 See surgical note for result.   ECHO As above (EF 35-40%, LV wall abnormalities, severely elevated pulmonary artery systolic pressure, moderate pericardial effusion)  TELEMETRY reviewed by me 10/24/2023: SR, rate 60s occasional PVCs  EKG reviewed by me: sinus rhythm with multiple PVCs, rate 93 bpm  Data reviewed by me 10/24/2023: last 24h vitals tele labs imaging I/O hospitalist progress note, nephrology note.  Principal Problem:   Volume overload Active Problems:   Anemia in chronic renal disease   Essential hypertension   Stroke (HCC)   End stage renal disease (HCC)   Type 2 diabetes mellitus with other diabetic kidney complication (HCC)   Gastroesophageal reflux disease without esophagitis  Acute respiratory failure with hypoxia (HCC)    ASSESSMENT AND PLAN:  Kristen Francis is a 77 y.o. female  with a past medical history of ESRD on HD (AV fistula on left upper arm; M/W/F), hypertension, hyperlipidemia, CVA, GERD, anemia  who presented to the ED on 10/22/2023 for respiratory distress. Cardiology was consulted for further evaluation.   # Acute respiratory failure with hypoxia  # Acute HFrEF (EF 35-40% on admission) # Pericardial Effusion  Patient presented to ED in acute respiratory distress, was placed on BiPAP. Patient has been experiencing shortness of breath for past 3 days, worse with exertion.  BNP > 4,500. Troponin mildly elevated 31 > 46. EKG in ED showed sinus rhythm with multiple PVCs, rate 93 bpm. Echo showed EF 35-40% with LV wall motion abnormalities, severely elevated pulmonary artery pressure, and moderate pericardial effusion. This is possibly newly reduced EF, have no other echo to compare. Patient remains on 4L [place], with no O2 requirement baseline. SOB improving, denies CP. -Wean O2 as able. -Continue losartan 50 mg daily, carvedilol 25 mg daily, hydralazine 50 mg 3 times daily.  -Continue  Lasix 80 mg on nondialysis days. -Plan for R + LHC Thursday morning with Dr. Darrold Junker for further evaluation of newly reduced EF.  -Continue aspirin 81 mg daily.  # Hypertension # Hyperlipidemia BP elevated since admission, improved this AM. -See plan for meds as above. -Continue rosuvastatin 5 mg on nondialysis days.  #ESRD on HD # Anemia Patient was on her way to dialysis 11/18 and developed acute respiratory distress which brought her to the ED. Patient has dialysis M,W,F. In ED, Cr 7.10 with stable Na, K. Patient had dialysis 11/18, took off 3L. Patient will have dialysis today.  -Management per nephrology.    This patient's plan of care was discussed and created with Dr. Darrold Junker and he is in agreement.  Signed: Gale Journey, PA-C  10/24/2023, 9:05 AM [facility] Cardiology

## 2023-10-25 ENCOUNTER — Encounter
Admission: EM | Disposition: A | Discharge status: Home / Self Care | Payer: Self-pay | Source: Home / Self Care | Attending: Hospitalist

## 2023-10-25 DIAGNOSIS — E8770 Fluid overload, unspecified: Secondary | ICD-10-CM | POA: Diagnosis not present

## 2023-10-25 HISTORY — PX: RIGHT/LEFT HEART CATH AND CORONARY ANGIOGRAPHY: CATH118266

## 2023-10-25 LAB — BASIC METABOLIC PANEL
Anion gap: 11 (ref 5–15)
BUN: 35 mg/dL — ABNORMAL HIGH (ref 8–23)
CO2: 27 mmol/L (ref 22–32)
Calcium: 8.3 mg/dL — ABNORMAL LOW (ref 8.9–10.3)
Chloride: 98 mmol/L (ref 98–111)
Creatinine, Ser: 4.73 mg/dL — ABNORMAL HIGH (ref 0.44–1.00)
GFR, Estimated: 9 mL/min — ABNORMAL LOW (ref >60)
Glucose, Bld: 83 mg/dL (ref 70–99)
Potassium: 4.2 mmol/L (ref 3.5–5.1)
Sodium: 136 mmol/L (ref 135–145)

## 2023-10-25 LAB — POCT I-STAT EG7
Acid-Base Excess: 3 mmol/L — ABNORMAL HIGH (ref 0.0–2.0)
Bicarbonate: 27.9 mmol/L (ref 20.0–28.0)
Calcium, Ion: 1.11 mmol/L — ABNORMAL LOW (ref 1.15–1.40)
HCT: 27 % — ABNORMAL LOW (ref 36.0–46.0)
Hemoglobin: 9.2 g/dL — ABNORMAL LOW (ref 12.0–15.0)
O2 Saturation: 78 %
Potassium: 4.5 mmol/L (ref 3.5–5.1)
Sodium: 134 mmol/L — ABNORMAL LOW (ref 135–145)
TCO2: 29 mmol/L (ref 22–32)
pCO2, Ven: 46.1 mm[Hg] (ref 44–60)
pH, Ven: 7.39 (ref 7.25–7.43)
pO2, Ven: 43 mm[Hg] (ref 32–45)

## 2023-10-25 LAB — CBC
HCT: 28.1 % — ABNORMAL LOW (ref 36.0–46.0)
Hemoglobin: 9.1 g/dL — ABNORMAL LOW (ref 12.0–15.0)
MCH: 27.9 pg (ref 26.0–34.0)
MCHC: 32.4 g/dL (ref 30.0–36.0)
MCV: 86.2 fL (ref 80.0–100.0)
Platelets: 184 10*3/uL (ref 150–400)
RBC: 3.26 MIL/uL — ABNORMAL LOW (ref 3.87–5.11)
RDW: 16.2 % — ABNORMAL HIGH (ref 11.5–15.5)
WBC: 5.7 10*3/uL (ref 4.0–10.5)
nRBC: 0 % (ref 0.0–0.2)

## 2023-10-25 LAB — MAGNESIUM: Magnesium: 2.1 mg/dL (ref 1.7–2.4)

## 2023-10-25 SURGERY — RIGHT/LEFT HEART CATH AND CORONARY ANGIOGRAPHY
Anesthesia: Moderate Sedation

## 2023-10-25 MED ORDER — IOHEXOL 300 MG/ML  SOLN
INTRAMUSCULAR | Status: DC | PRN
  Administered 2023-10-25: 74 mL

## 2023-10-25 MED ORDER — FENTANYL CITRATE (PF) 100 MCG/2ML IJ SOLN
INTRAMUSCULAR | Status: AC
  Filled 2023-10-25: qty 2

## 2023-10-25 MED ORDER — VERAPAMIL HCL 2.5 MG/ML IV SOLN
INTRAVENOUS | Status: AC
  Filled 2023-10-25: qty 2

## 2023-10-25 MED ORDER — SODIUM CHLORIDE 0.9 % WEIGHT BASED INFUSION: 1.0000 mL/kg/h | INTRAVENOUS | Status: AC

## 2023-10-25 MED ORDER — SODIUM CHLORIDE 0.9 % IV SOLN: INTRAVENOUS | Status: DC

## 2023-10-25 MED ORDER — LABETALOL HCL 5 MG/ML IV SOLN
10.0000 mg | INTRAVENOUS | Status: AC | PRN
Start: 2023-10-25 — End: 2023-10-25

## 2023-10-25 MED ORDER — ASPIRIN 81 MG PO CHEW
CHEWABLE_TABLET | ORAL | Status: AC
  Filled 2023-10-25: qty 1

## 2023-10-25 MED ORDER — ASPIRIN 81 MG PO CHEW
81.0000 mg | CHEWABLE_TABLET | ORAL | Status: AC
  Administered 2023-10-25: 81 mg via ORAL

## 2023-10-25 MED ORDER — SODIUM CHLORIDE 0.9 % IV SOLN: 250.0000 mL | INTRAVENOUS | Status: DC | PRN

## 2023-10-25 MED ORDER — LIDOCAINE HCL 1 % IJ SOLN
INTRAMUSCULAR | Status: AC
  Filled 2023-10-25: qty 20

## 2023-10-25 MED ORDER — HEPARIN SODIUM (PORCINE) 1000 UNIT/ML IJ SOLN
INTRAMUSCULAR | Status: DC | PRN
  Administered 2023-10-25: 2000 [IU] via INTRAVENOUS

## 2023-10-25 MED ORDER — HEPARIN SODIUM (PORCINE) 1000 UNIT/ML IJ SOLN
INTRAMUSCULAR | Status: AC
  Filled 2023-10-25: qty 10

## 2023-10-25 MED ORDER — HEPARIN (PORCINE) IN NACL 1000-0.9 UT/500ML-% IV SOLN
INTRAVENOUS | Status: DC | PRN
  Administered 2023-10-25: 1000 mL

## 2023-10-25 MED ORDER — LIDOCAINE HCL 1 % IJ SOLN
INTRAMUSCULAR | Status: DC | PRN
  Administered 2023-10-25: 6 mL

## 2023-10-25 MED ORDER — MIDAZOLAM HCL 2 MG/2ML IJ SOLN
INTRAMUSCULAR | Status: AC
  Filled 2023-10-25: qty 2

## 2023-10-25 MED ORDER — FENTANYL CITRATE (PF) 100 MCG/2ML IJ SOLN
INTRAMUSCULAR | Status: DC | PRN
  Administered 2023-10-25: 25 ug via INTRAVENOUS

## 2023-10-25 MED ORDER — HYDRALAZINE HCL 20 MG/ML IJ SOLN: 10.0000 mg | INTRAMUSCULAR | Status: AC | PRN

## 2023-10-25 MED ORDER — VERAPAMIL HCL 2.5 MG/ML IV SOLN
INTRAVENOUS | Status: DC | PRN
  Administered 2023-10-25 (×3): 2.5 mg via INTRAVENOUS

## 2023-10-25 MED ORDER — SODIUM CHLORIDE 0.9 % IV SOLN
250.0000 mL | INTRAVENOUS | Status: DC | PRN
Start: 2023-10-25 — End: 2023-10-25

## 2023-10-25 MED ORDER — SODIUM CHLORIDE 0.9% FLUSH: 3.0000 mL | Freq: Two times a day (BID) | INTRAVENOUS | Status: DC

## 2023-10-25 MED ORDER — ACETAMINOPHEN 325 MG PO TABS
650.0000 mg | ORAL_TABLET | ORAL | Status: DC | PRN
Start: 2023-10-25 — End: 2023-10-25
  Filled 2023-10-25: qty 2

## 2023-10-25 MED ORDER — HEPARIN (PORCINE) IN NACL 1000-0.9 UT/500ML-% IV SOLN
INTRAVENOUS | Status: AC
  Filled 2023-10-25: qty 1000

## 2023-10-25 MED ORDER — ACETAMINOPHEN 500 MG PO TABS
1000.0000 mg | ORAL_TABLET | Freq: Three times a day (TID) | ORAL | Status: DC | PRN
  Administered 2023-10-25 – 2023-10-26 (×2): 1000 mg via ORAL
  Filled 2023-10-25: qty 2

## 2023-10-25 MED ORDER — LOSARTAN POTASSIUM 50 MG PO TABS
100.0000 mg | ORAL_TABLET | Freq: Every day | ORAL | Status: DC
  Administered 2023-10-25 – 2023-10-26 (×2): 100 mg via ORAL
  Filled 2023-10-25: qty 2

## 2023-10-25 MED ORDER — MIDAZOLAM HCL 2 MG/2ML IJ SOLN
INTRAMUSCULAR | Status: DC | PRN
  Administered 2023-10-25: 1 mg via INTRAVENOUS

## 2023-10-25 MED ORDER — SODIUM CHLORIDE 0.9% FLUSH: 3.0000 mL | INTRAVENOUS | Status: DC | PRN

## 2023-10-25 SURGICAL SUPPLY — 18 items
CATH 5FR JL3.5 JR4 ANG PIG MP (CATHETERS) IMPLANT
CATH BALLN WEDGE 5F 110CM (CATHETERS) IMPLANT
CATH INFINITI 5 FR MPA2 (CATHETERS) IMPLANT
CATH INFINITI JR4 5F (CATHETERS) IMPLANT
DEVICE RAD TR BAND REGULAR (VASCULAR PRODUCTS) IMPLANT
DRAPE BRACHIAL (DRAPES) IMPLANT
GLIDESHEATH SLEND SS 6F .021 (SHEATH) IMPLANT
GUIDEWIRE .025 260CM (WIRE) IMPLANT
GUIDEWIRE EMER 3M J .025X150CM (WIRE) IMPLANT
GUIDEWIRE INQWIRE 1.5J.035X260 (WIRE) IMPLANT
INQWIRE 1.5J .035X260CM (WIRE) ×1
KIT SYRINGE INJ CVI SPIKEX1 (MISCELLANEOUS) IMPLANT
PACK CARDIAC CATH (CUSTOM PROCEDURE TRAY) ×1 IMPLANT
PROTECTION STATION PRESSURIZED (MISCELLANEOUS) ×1
SET ATX-X65L (MISCELLANEOUS) IMPLANT
SHEATH GLIDE SLENDER 4/5FR (SHEATH) IMPLANT
STATION PROTECTION PRESSURIZED (MISCELLANEOUS) IMPLANT
WIRE HITORQ VERSACORE ST 145CM (WIRE) IMPLANT

## 2023-10-25 NOTE — Plan of Care (Signed)

## 2023-10-25 NOTE — Progress Notes (Signed)
  PROGRESS NOTE    Kristen Francis  MWN:[id number] DOB: [birth year] DOA: 10/22/2023 PCP: Sallyanne Kuster, NP  [id number]  LOS: 3 days   Brief hospital course:   Assessment & Plan: Kristen Francis is a 77 y.o. female with medical history significant of ESRD, HTN, CVA, GERD presenting with acute respiratory failure with hypoxia, volume overload.  Patient reports acute onset of shortness of breath around 2 AM.  Worsening orthopnea, PND.  Baseline ESRD on hemodialysis Monday Wednesday Friday.  Denies any missed episodes of dialysis.   Pulmonary edema: as per CXR. Improved after urgent HD yesterday. Echo showed reduced EF 35-40%, moderate pericardial effusion, grade II diastolic dysfunction, severe hypokinesis of LV, LA is severely dilated, RA is mildly dilated, mod-sever MR & severely dilated pulmon artery. Nephro following and recs apprec. Cardio consulted --cont lasix --iHD for fluid removal   Acute combined CHF exacerbation: no previous hx of CHF as per pt. Cardio consulted --right and left heart cath today --Continue on coreg, losartan, lasix.   Moderate pericardial effusion: etiology unclear. As per echo. Likely contributed to pt's dyspnea. Cardio consulted   Acute hypoxemic respiratory failure likely secondary to all above.  --needed BiPAP on presentation.   --Continue supplemental O2 to keep sats >=92%, wean as tolerated   ESRD on HD MWF.  --iHD per nephro   HTN: continue on coreg, losartan, hydralazine, lasix   DM2: well controlled, 4.6. Does not need SSI.    Hx of CVA: continue on aspirin, statin    ACD: likely secondary to ESRD. No need for transfuse currently     DVT prophylaxis: Heparin SQ Code Status: Full code  Family Communication:  Level of care: Progressive Dispo:   The patient is from: home Anticipated d/c is to: home Anticipated d/c date is: tomorrow   Subjective and Interval History:  Went for right and left heart cath today.   Afterwards, pt complained of right upper arm pain.   Objective: Vitals:   10/25/23 1145 10/25/23 1200 10/25/23 1215 10/25/23 1230  BP: (!) 176/70 (!) 167/65 (!) 156/55 (!) 152/54  Pulse: 66 66 60 63  Resp: 14 19 20 19   Temp:      TempSrc:      SpO2: 100% 100% 100% 100%  Weight:      Height:        Intake/Output Summary (Last 24 hours) at 10/25/2023 1638 Last data filed at 10/24/2023 1739 Gross per 24 hour  Intake --  Output 1500 ml  Net -1500 ml   Filed Weights   10/24/23 1346 10/24/23 1743 10/25/23 0843  Weight: 47.7 kg 46.2 kg 46.2 kg    Examination:   Constitutional: NAD, AAOx3 HEENT: conjunctivae and lids normal, EOMI CV: No cyanosis.   RESP: normal respiratory effort Neuro: II - XII grossly intact.   Psych: Normal mood and affect.  Appropriate judgement and reason   Data Reviewed: I have personally reviewed labs and imaging studies  Time spent: 35 minutes  Darlin Priestly, MD [facility] If 7PM-7AM, please contact night-coverage 10/25/2023, 4:38 PM

## 2023-10-25 NOTE — Progress Notes (Signed)
[facility] CLINIC CARDIOLOGY PROGRESS NOTE       Patient ID: Kristen Francis MRN: [id number] DOB/AGE: [birth year] 77 y.o.  Admit date: 10/22/2023 Referring Physician Fabienne Bruns, MD Primary Physician Sallyanne Kuster, NP Primary Cardiologist Windell Norfolk, MD  Reason for Consultation AoCHF, moderate pericardial effusion  HPI: Kristen Francis is a 77 y.o. female  with a past medical history of ESRD on HD (AV fistula on left upper arm; M/W/F), hypertension, hyperlipidemia, CVA, GERD, anemia who presented to the ED on 10/22/2023 for respiratory distress. Cardiology was consulted for further evaluation.   Interval history: -Patient seen and examined this AM prior to [facility]. Reports she feels well. -SOB much improved, weaning O2 today.  -BP improved but still elevated, HR stable.   Review of systems complete and found to be negative unless listed above    Past Medical History:  Diagnosis Date   Anemia    Arthritis    Chronic kidney disease    ESRD   Complication of anesthesia    had procedure and felt incision early 76s   Gout    Hyperlipemia    Hypertension    Macular degeneration, right eye    Stroke [facility])     Past Surgical History:  Procedure Laterality Date   A/V FISTULAGRAM Left 12/25/2018   Procedure: A/V FISTULAGRAM;  Surgeon: Renford Dills, MD;  Location: [facility] INVASIVE CV LAB;  Service: Cardiovascular;  Laterality: Left;   A/V FISTULAGRAM Left 10/09/2023   Procedure: A/V Fistulagram;  Surgeon: Renford Dills, MD;  Location: [facility] INVASIVE CV LAB;  Service: Cardiovascular;  Laterality: Left;   ABDOMINAL HYSTERECTOMY     APPENDECTOMY     AV FISTULA PLACEMENT Left 07/12/2018   Procedure: INSERTION OF ARTERIOVENOUS (AV) GORE-TEX GRAFT ARM ( BRACHIAL AXILLARY );  Surgeon: Renford Dills, MD;  Location: [facility] ORS;  Service: Vascular;  Laterality: Left;   COLONOSCOPY WITH PROPOFOL N/A 01/14/2016   Procedure: COLONOSCOPY WITH PROPOFOL;  Surgeon: Scot Jun, MD;  Location: [facility] ENDOSCOPY;  Service: Endoscopy;  Laterality: N/A;   COLONOSCOPY WITH PROPOFOL N/A 02/14/2022   Procedure: COLONOSCOPY WITH PROPOFOL;  Surgeon: Regis Bill, MD;  Location: [facility] ENDOSCOPY;  Service: Endoscopy;  Laterality: N/A;   TONSILLECTOMY      Medications Prior to Admission  Medication Sig Dispense Refill Last Dose   amLODipine (NORVASC) 10 MG tablet TAKE 1 TABLET(10 MG) BY MOUTH DAILY 90 tablet 3 10/21/2023 at Unknown   aspirin EC 81 MG EC tablet Take 1 tablet (81 mg total) by mouth daily. Swallow whole. 30 tablet 0    B Complex-C-Folic Acid (RENAL-VITE) 0.8 MG TABS Take 0.8 mg by mouth every evening. On dialysis days      benzonatate (TESSALON) 100 MG capsule Take 1 capsule (100 mg total) by mouth 2 (two) times daily as needed for cough. 60 capsule 3 Unknown at PRN   carvedilol (COREG) 25 MG tablet TAKE 1 TABLET(25 MG) BY MOUTH TWICE DAILY WITH A MEAL 180 tablet 3 10/21/2023 at Unknown   cholecalciferol (VITAMIN D) 1000 units tablet Take 1,000 Units by mouth daily.       cinacalcet (SENSIPAR) 60 MG tablet Take 120 mg by mouth daily with supper.   10/21/2023 at 1800   ferrous sulfate 325 (65 FE) MG tablet Take 1 tablet (325 mg total) by mouth 2 (two) times daily with a meal. 60 tablet 2    furosemide (LASIX) 80 MG tablet Take 1 tablet (80 mg total) by mouth  4 (four) times a week. On non-dialysis days 16 tablet 2 10/21/2023 at Unknown   hydrALAZINE (APRESOLINE) 50 MG tablet TAKE 1 TABLET(50 MG) BY MOUTH THREE TIMES DAILY. 90 tablet 2 10/21/2023 at Unknown   lidocaine-prilocaine (EMLA) cream Apply 1 Application topically as directed.  6    losartan (COZAAR) 50 MG tablet TAKE 1 TABLET(50 MG) BY MOUTH DAILY 90 tablet 3 10/21/2023 at Unknown   omeprazole (PRILOSEC) 40 MG capsule TAKE 1 CAPSULE(40 MG) BY MOUTH DAILY 90 capsule 3    rosuvastatin (CRESTOR) 5 MG tablet Take 5 mg by mouth See admin instructions. Take 1 tablet (5mg ) by mouth on non-dialysis days    10/21/2023 at Unknown   sennosides-docusate sodium (SENOKOT-S) 8.6-50 MG tablet Take 2 tablets by mouth daily.      sevelamer carbonate (RENVELA) 800 MG tablet Take 1,600 mg by mouth 3 (three) times daily with meals.   10/21/2023 at 1800   dronabinol (MARINOL) 5 MG capsule Take 1 capsule by mouth before meals twice daily on non-dialysis days 40 capsule 2    HYDROcodone-acetaminophen (NORCO/VICODIN) 5-325 MG tablet Take 1 tablet by mouth every 4 (four) hours as needed for moderate pain. (Patient not taking: Reported on 10/22/2023) 10 tablet 0 Not Taking   metoCLOPramide (REGLAN) 5 MG tablet Take 1 tablet (5 mg total) by mouth 4 (four) times daily -  before meals and at bedtime. (Patient not taking: Reported on 10/22/2023) 120 tablet 1 Not Taking   predniSONE (DELTASONE) 50 MG tablet Take 1 tablet (50 mg total) by mouth daily with breakfast. (Patient not taking: Reported on 10/22/2023) 5 tablet 0 Not Taking   Social History   Socioeconomic History   Marital status: Married    Spouse name: Not on file   Number of children: Not on file   Years of education: Not on file   Highest education level: Not on file  Occupational History   Not on file  Tobacco Use   Smoking status: Former   Smokeless tobacco: Never  Vaping Use   Vaping status: Never Used  Substance and Sexual Activity   Alcohol use: No   Drug use: No   Sexual activity: Not on file  Other Topics Concern   Not on file  Social History Narrative   Lives in  [place]; used to clean; no smoking; no alcohol.    Social Determinants of Health   Financial Resource Strain: Not on file  Food Insecurity: No Food Insecurity (10/22/2023)   Hunger Vital Sign    Worried About Running Out of Food in the Last Year: Never true    Ran Out of Food in the Last Year: Never true  Transportation Needs: No Transportation Needs (10/22/2023)   PRAPARE - Administrator, Civil Service (Medical): No    Lack of Transportation (Non-Medical):  No  Physical Activity: Not on file  Stress: Not on file  Social Connections: Not on file  Intimate Partner Violence: Not At Risk (10/22/2023)   Humiliation, Afraid, Rape, and Kick questionnaire    Fear of Current or Ex-Partner: No    Emotionally Abused: No    Physically Abused: No    Sexually Abused: No    Family History  Problem Relation Age of Onset   Kidney disease Mother    Breast cancer Neg Hx      Vitals:   10/25/23 1145 10/25/23 1200 10/25/23 1215 10/25/23 1230  BP: (!) 176/70 (!) 167/65 (!) 156/55 (!) 152/54  Pulse: 66 66  60 63  Resp: 14 19 20 19   Temp:      TempSrc:      SpO2: 100% 100% 100% 100%  Weight:      Height:        PHYSICAL EXAM General: Chronically-ill appearing elderly female, well nourished, in no acute distress laying nearly flat in hospital bed. HEENT: Normocephalic and atraumatic. Neck: No JVD.   Lungs: Normal respiratory effort on 4L [place] (baseline RA).  Mild crackles bilaterally. Heart: HRRR. Normal S1 and S2 without gallops or murmurs.  Abdomen: Non-distended appearing.  Msk: Normal strength and tone for age. Extremities: Warm and well perfused. No clubbing, cyanosis. No edema.  Neuro: Alert and oriented X 3. Psych: Answers questions appropriately.   Labs: Basic Metabolic Panel: Recent Labs    10/24/23 1350 10/25/23 0317 10/25/23 1010  NA 135 136 134*  K 4.2 4.2 4.5  CL 94* 98  --   CO2 29 27  --   GLUCOSE 100* 83  --   BUN 61* 35*  --   CREATININE 7.91* 4.73*  --   CALCIUM 8.5* 8.3*  --   MG  --  2.1  --   PHOS 5.0*  --   --    Liver Function Tests: Recent Labs    10/23/23 1953 10/24/23 1350  AST 13*  --   ALT 8  --   ALKPHOS 32*  --   BILITOT 0.7  --   PROT 5.9*  --   ALBUMIN 3.1* 2.9*   No results for input(s): "LIPASE", "AMYLASE" in the last 72 hours. CBC: Recent Labs    10/24/23 1350 10/25/23 0317 10/25/23 1010  WBC 4.6 5.7  --   HGB 9.3* 9.1* 9.2*  HCT 28.7* 28.1* 27.0*  MCV 87.8 86.2  --   PLT 186 184   --    Cardiac Enzymes: No results for input(s): "CKTOTAL", "CKMB", "CKMBINDEX", "TROPONINIHS" in the last 72 hours.  BNP: No results for input(s): "BNP" in the last 72 hours.  D-Dimer: No results for input(s): "DDIMER" in the last 72 hours. Hemoglobin A1C: No results for input(s): "HGBA1C" in the last 72 hours.  Fasting Lipid Panel: No results for input(s): "CHOL", "HDL", "LDLCALC", "TRIG", "CHOLHDL", "LDLDIRECT" in the last 72 hours. Thyroid Function Tests: No results for input(s): "TSH", "T4TOTAL", "T3FREE", "THYROIDAB" in the last 72 hours.  Invalid input(s): "FREET3" Anemia Panel: No results for input(s): "VITAMINB12", "FOLATE", "FERRITIN", "TIBC", "IRON", "RETICCTPCT" in the last 72 hours.   Radiology: CARDIAC CATHETERIZATION  Result Date: 10/25/2023   Hemodynamic findings consistent with mild pulmonary hypertension. 1.  Mild pulmonary hypertension (PA mean 28 mmHg) 2.  Normal coronary anatomy Recommendations 1.  Good medical management   ECHOCARDIOGRAM COMPLETE  Result Date: 10/22/2023    ECHOCARDIOGRAM REPORT   Patient Name:   VERNICA JUDAY Kafer Date of Exam: 10/22/2023 Medical Rec #:  [id number]            Height:       63.0 in Accession #:    [id number]           Weight:       114.8 lb Date of Birth:  Feb 14, 1946            BSA:          1.527 m Patient Age:    77 years             BP:           155/62 mmHg  Patient Gender: F                    HR:           79 bpm. Exam Location:  [facility] Procedure: 2D Echo, Cardiac Doppler, Color Doppler, 3D Echo and Strain Analysis Indications:     Acute respiratory distress  History:         Patient has no prior history of Echocardiogram examinations.                  Stroke and TIA, Signs/Symptoms:Fatigue and Shortness of Breath;                  Risk Factors:Hypertension and Diabetes. CKD.  Sonographer:     Mikki Harbor Referring Phys:  [phone] Francoise Schaumann NE[place]ON Diagnosing Phys: Lorine Bears MD  Sonographer Comments: Global longitudinal  strain was attempted. IMPRESSIONS  1. Left ventricular ejection fraction, by estimation, is 35 to 40%. The left ventricle has moderately decreased function. The left ventricle demonstrates regional wall motion abnormalities (see scoring diagram/findings for description). The left ventricular internal cavity size was moderately dilated. There is moderate left ventricular hypertrophy. Left ventricular diastolic parameters are consistent with Grade II diastolic dysfunction (pseudonormalization). There is severe hypokinesis of the left ventricular, inferior wall and inferolateral wall. The average left ventricular global longitudinal strain is -13.2 %. The global longitudinal strain is abnormal.  2. Right ventricular systolic function is normal. The right ventricular size is normal. There is severely elevated pulmonary artery systolic pressure. The estimated right ventricular systolic pressure is 94.5 mmHg.  3. Left atrial size was severely dilated.  4. Right atrial size was mildly dilated.  5. Moderate pericardial effusion. The pericardial effusion is circumferential.  6. The mitral valve is normal in structure. Moderate to severe mitral valve regurgitation. No evidence of mitral stenosis. Moderate mitral annular calcification.  7. The aortic valve is normal in structure. Aortic valve regurgitation is moderate to severe. Aortic valve sclerosis/calcification is present, without any evidence of aortic stenosis.  8. Pulmonic valve regurgitation is moderate.  9. Severely dilated pulmonary artery. 10. The inferior vena cava is normal in size with <50% respiratory variability, suggesting right atrial pressure of 8 mmHg. FINDINGS  Left Ventricle: Left ventricular ejection fraction, by estimation, is 35 to 40%. The left ventricle has moderately decreased function. The left ventricle demonstrates regional wall motion abnormalities. The average left ventricular global longitudinal strain is -13.2 %. The global longitudinal  strain is abnormal. The left ventricular internal cavity size was moderately dilated. There is moderate left ventricular hypertrophy. Left ventricular diastolic parameters are consistent with Grade II diastolic dysfunction (pseudonormalization). Right Ventricle: The right ventricular size is normal. No increase in right ventricular wall thickness. Right ventricular systolic function is normal. There is severely elevated pulmonary artery systolic pressure. The tricuspid regurgitant velocity is 4.65 m/s, and with an assumed right atrial pressure of 8 mmHg, the estimated right ventricular systolic pressure is 94.5 mmHg. Left Atrium: Left atrial size was severely dilated. Right Atrium: Right atrial size was mildly dilated. Pericardium: A moderately sized pericardial effusion is present. The pericardial effusion is circumferential. Mitral Valve: The mitral valve is normal in structure. There is moderate thickening of the mitral valve leaflet(s). There is mild calcification of the mitral valve leaflet(s). Moderate mitral annular calcification. Moderate to severe mitral valve regurgitation. No evidence of mitral valve stenosis. MV peak gradient, 3.0 mmHg. The mean mitral valve gradient is 1.0 mmHg. Tricuspid Valve: The tricuspid  valve is normal in structure. Tricuspid valve regurgitation is mild . No evidence of tricuspid stenosis. Aortic Valve: The aortic valve is normal in structure. Aortic valve regurgitation is moderate to severe. Aortic regurgitation PHT measures 519 msec. Aortic valve sclerosis/calcification is present, without any evidence of aortic stenosis. Aortic valve mean gradient measures 6.0 mmHg. Aortic valve peak gradient measures 14.4 mmHg. Aortic valve area, by VTI measures 1.95 cm. Pulmonic Valve: The pulmonic valve was normal in structure. Pulmonic valve regurgitation is moderate. No evidence of pulmonic stenosis. Aorta: The aortic root is normal in size and structure. Pulmonary Artery: The pulmonary  artery is severely dilated. Venous: The inferior vena cava is normal in size with less than 50% respiratory variability, suggesting right atrial pressure of 8 mmHg. IAS/Shunts: No atrial level shunt detected by color flow Doppler. Additional Comments: There is a small pleural effusion in the left lateral region.  LEFT VENTRICLE PLAX 2D LVIDd:         6.00 cm      Diastology LVIDs:         4.90 cm      LV e' medial:    4.13 cm/s LV PW:         1.30 cm      LV E/e' medial:  20.2 LV IVS:        1.40 cm      LV e' lateral:   5.44 cm/s LVOT diam:     1.90 cm      LV E/e' lateral: 15.3 LV SV:         79 LV SV Index:   52           2D Longitudinal Strain LVOT Area:     2.84 cm     2D Strain GLS Avg:     -13.2 %  LV Volumes (MOD) LV vol d, MOD A2C: 142.0 ml LV vol d, MOD A4C: 115.0 ml LV vol s, MOD A2C: 62.8 ml LV vol s, MOD A4C: 70.5 ml LV SV MOD A2C:     79.2 ml LV SV MOD A4C:     115.0 ml LV SV MOD BP:      61.9 ml RIGHT VENTRICLE RV Basal diam:  3.90 cm RV Mid diam:    3.30 cm RV S prime:     14.80 cm/s TAPSE (M-mode): 1.9 cm LEFT ATRIUM              Index        RIGHT ATRIUM           Index LA diam:        4.80 cm  3.14 cm/m   RA Area:     18.90 cm LA Vol (A2C):   144.0 ml 94.29 ml/m  RA Volume:   55.80 ml  36.54 ml/m LA Vol (A4C):   121.0 ml 79.23 ml/m LA Biplane Vol: 138.0 ml 90.36 ml/m  AORTIC VALVE                     PULMONIC VALVE AV Area (Vmax):    2.10 cm      PV Vmax:       1.14 m/s AV Area (Vmean):   1.91 cm      PV Peak grad:  5.2 mmHg AV Area (VTI):     1.95 cm AV Vmax:           190.00 cm/s AV Vmean:  115.000 cm/s AV VTI:            0.407 m AV Peak Grad:      14.4 mmHg AV Mean Grad:      6.0 mmHg LVOT Vmax:         141.00 cm/s LVOT Vmean:        77.300 cm/s LVOT VTI:          0.280 m LVOT/AV VTI ratio: 0.69 AI PHT:            519 msec  AORTA Ao Root diam: 3.60 cm Ao Asc diam:  3.40 cm MITRAL VALVE                  TRICUSPID VALVE MV Area (PHT): 3.70 cm       TR Peak grad:   86.5 mmHg MV  Area VTI:   3.36 cm       TR Vmax:        465.00 cm/s MV Peak grad:  3.0 mmHg MV Mean grad:  1.0 mmHg       SHUNTS MV Vmax:       0.87 m/s       Systemic VTI:  0.28 m MV Vmean:      56.8 cm/s      Systemic Diam: 1.90 cm MV Decel Time: 205 msec MR Peak grad:    138.3 mmHg MR Mean grad:    92.0 mmHg MR Vmax:         588.00 cm/s MR Vmean:        457.0 cm/s MR PISA:         2.26 cm MR PISA Eff ROA: 36 mm MR PISA Radius:  0.60 cm MV E velocity: 83.30 cm/s MV A velocity: 93.30 cm/s MV E/A ratio:  0.89 Lorine Bears MD Electronically signed by Lorine Bears MD Signature Date/Time: 10/22/2023/5:06:31 PM    Final    DG Chest Portable 1 View  Result Date: 10/22/2023 CLINICAL DATA:  Respiratory distress. EXAM: PORTABLE CHEST 1 VIEW COMPARISON:  08/03/2023 FINDINGS: Cardiac enlargement. Aortic atherosclerosis. Small left pleural effusion is suspected. Diffuse increase interstitial markings identified bilaterally with bilateral patchy airspace opacities. There is advanced degenerative joint disease involving the glenohumeral and acromioclavicular joints. IMPRESSION: Cardiac enlargement, small left pleural effusion, increased interstitial markings and bilateral patchy airspace opacities compatible with moderate pulmonary edema. Electronically Signed   By: Signa Kell M.D.   On: 10/22/2023 07:00   MM 3D SCREENING MAMMOGRAM BILATERAL BREAST  Result Date: 10/19/2023 CLINICAL DATA:  Screening. EXAM: DIGITAL SCREENING BILATERAL MAMMOGRAM WITH TOMOSYNTHESIS AND CAD TECHNIQUE: Bilateral screening digital craniocaudal and mediolateral oblique mammograms were obtained. Bilateral screening digital breast tomosynthesis was performed. The images were evaluated with computer-aided detection. COMPARISON:  Previous exam(s). ACR Breast Density Category c: The breasts are heterogeneously dense, which may obscure small masses. FINDINGS: There are no findings suspicious for malignancy. IMPRESSION: No mammographic evidence of  malignancy. A result letter of this screening mammogram will be mailed directly to the patient. RECOMMENDATION: Screening mammogram in one year. (Code:SM-B-01Y) BI-RADS CATEGORY  1: Negative. Electronically Signed   By: Elberta Fortis M.D.   On: 10/19/2023 10:33   PERIPHERAL VASCULAR CATHETERIZATION  Result Date: 10/09/2023 See surgical note for result.   ECHO As above (EF 35-40%, LV wall abnormalities, severely elevated pulmonary artery systolic pressure, moderate pericardial effusion)  TELEMETRY reviewed by me 10/25/2023: SR, rate 60s occasional PVCs  EKG reviewed by me: sinus rhythm with multiple PVCs, rate 93  bpm  Data reviewed by me 10/25/2023: last 24h vitals tele labs imaging I/O hospitalist progress note, nephrology note.  Principal Problem:   Volume overload Active Problems:   Anemia in chronic renal disease   Essential hypertension   Stroke (HCC)   End stage renal disease (HCC)   Type 2 diabetes mellitus with other diabetic kidney complication (HCC)   Gastroesophageal reflux disease without esophagitis   Acute respiratory failure with hypoxia (HCC)    ASSESSMENT AND PLAN:  Kristen Francis is a 77 y.o. female  with a past medical history of ESRD on HD (AV fistula on left upper arm; M/W/F), hypertension, hyperlipidemia, CVA, GERD, anemia  who presented to the ED on 10/22/2023 for respiratory distress. Cardiology was consulted for further evaluation.   # Acute respiratory failure with hypoxia  # Acute HFrEF (EF 35-40% on admission) # Pericardial effusion  # Mild pulmonary hypertension Patient presented to ED in acute respiratory distress, was placed on BiPAP. Patient has been experiencing shortness of breath for past 3 days, worse with exertion.  BNP > 4,500. Troponin mildly elevated 31 > 46. EKG in ED showed sinus rhythm with multiple PVCs, rate 93 bpm. Echo showed EF 35-40% with LV wall motion abnormalities, severely elevated pulmonary artery pressure, and moderate  pericardial effusion. This is possibly newly reduced EF, have no other echo to compare. R+LHC today with normal coronaries, mildly elevated right heart pressures and mild pulmonary hypertension.  -Wean O2 as able. -Increase losartan to 100 mg daily. Continue carvedilol 25 mg daily, hydralazine 50 mg 3 times daily.  -Continue Lasix 80 mg on nondialysis days. -Continue aspirin 81 mg daily. -Echo reviewed by Dr. Darrold Junker, pericardial effusion mild to moderate. Likely 2/2 ESRD on HD.   # Hypertension # Hyperlipidemia BP elevated since admission, improved this AM. -See plan for meds as above. -Continue rosuvastatin 5 mg on nondialysis days.  #ESRD on HD # Anemia Patient was on her way to dialysis 11/18 and developed acute respiratory distress which brought her to the ED. Patient has dialysis M,W,F. In ED, Cr 7.10 with stable Na, K. Patient had dialysis 11/18, took off 3L. Dialysis 11/20, took 1.5 L off.  -Management per nephrology.   Ok for discharge today from a cardiac perspective. Will arrange for follow up in clinic with Dr. Juliann Pares in 1-2 weeks.    This patient's plan of care was discussed and created with Dr. Darrold Junker and he is in agreement.  Signed: Gale Journey, PA-C  10/25/2023, 1:48 PM [facility] Cardiology

## 2023-10-25 NOTE — Plan of Care (Signed)

## 2023-10-25 NOTE — Progress Notes (Signed)
[facility]  ROUNDING NOTE   Subjective:   Patient well-known to [place] from prior admissions. Normally goes to [facility]. Last treatment prior to admission was 10/19/2023.  Update:  Patient due for cardiac catheterization today. No hemodialysis treatment yesterday. Breathing comfortably this a.m.    Objective:  Vital signs in last 24 hours:  Temp:  [97.9 F (36.6 C)-99.1 F (37.3 C)] 98.2 F (36.8 C) (11/21 0843) Pulse Rate:  [60-74] 63 (11/21 1230) Resp:  [14-22] 19 (11/21 1230) BP: (119-176)/(49-100) 152/54 (11/21 1230) SpO2:  [100 %] 100 % (11/21 1230) Weight:  [46.2 kg-47.7 kg] 46.2 kg (11/21 0843)  Weight change:  Filed Weights   10/24/23 1346 10/24/23 1743 10/25/23 0843  Weight: 47.7 kg 46.2 kg 46.2 kg    Intake/Output: I/O last 3 completed shifts: In: 240 [P.O.:240] Out: 1500 [Other:1500]   Intake/Output this shift:  No intake/output data recorded.  Physical Exam: General: No acute distress  Head: Normocephalic, atraumatic. Moist oral mucosal membranes  Neck: Supple  Lungs:  Bibasilar minimal Rales  Heart: S1S2 no rubs  Abdomen:  Soft, nontender, bowel sounds present  Extremities: Trace peripheral edema.  Neurologic: Awake, alert, following commands  Skin: No acute rash  Access: Left upper extremity AV fistula    Basic Metabolic Panel: Recent Labs  Lab 10/22/23 0600 10/22/23 0955 10/23/23 1953 10/24/23 1350 10/25/23 0317 10/25/23 1010  NA 140  --  134* 135 136 134*  K 4.1 3.9 3.9 4.2 4.2 4.5  CL 95*  --  94* 94* 98  --   CO2 27  --  30 29 27   --   GLUCOSE 143*  --  108* 100* 83  --   BUN 53*  --  48* 61* 35*  --   CREATININE 7.10*  --  6.48* 7.91* 4.73*  --   CALCIUM 9.1  --  8.4* 8.5* 8.3*  --   MG  --   --   --   --  2.1  --   PHOS  --   --   --  5.0*  --   --     Liver Function Tests: Recent Labs  Lab 10/23/23 1953 10/24/23 1350  AST 13*  --   ALT 8  --   ALKPHOS 32*  --   BILITOT 0.7  --    PROT 5.9*  --   ALBUMIN 3.1* 2.9*   No results for input(s): "LIPASE", "AMYLASE" in the last 168 hours. No results for input(s): "AMMONIA" in the last 168 hours.  CBC: Recent Labs  Lab 10/22/23 0600 10/23/23 1953 10/24/23 1350 10/25/23 0317 10/25/23 1010  WBC 9.8 4.3 4.6 5.7  --   HGB 10.7* 9.3* 9.3* 9.1* 9.2*  HCT 33.9* 29.0* 28.7* 28.1* 27.0*  MCV 90.4 87.9 87.8 86.2  --   PLT 204 198 186 184  --     Cardiac Enzymes: No results for input(s): "CKTOTAL", "CKMB", "CKMBINDEX", "TROPONINI" in the last 168 hours.  BNP: Invalid input(s): "POCBNP"  CBG: Recent Labs  Lab 10/22/23 1158 10/22/23 1732 10/23/23 0058 10/23/23 0750 10/23/23 1152  GLUCAP 70 118* 92 126* 107*    Microbiology: Results for orders placed or performed during the hospital encounter of 10/22/23  MRSA Next Gen by PCR, Nasal     Status: None   Collection Time: 10/22/23  9:55 AM   Specimen: Nasal Mucosa; Nasal Swab  Result Value Ref Range Status   MRSA by PCR Next Gen NOT DETECTED  NOT DETECTED Final    Comment: (NOTE) The GeneXpert MRSA Assay (FDA approved for NASAL specimens only), is one component of a comprehensive MRSA colonization surveillance program. It is not intended to diagnose MRSA infection nor to guide or monitor treatment for MRSA infections. Test performance is not FDA approved in patients less than 44 years old. Performed at [facility], [address] [place], [place] [address]     Coagulation Studies: No results for input(s): "LABPROT", "INR" in the last 72 hours.  Urinalysis: No results for input(s): "COLORURINE", "LABSPEC", "PHURINE", "GLUCOSEU", "HGBUR", "BILIRUBINUR", "KETONESUR", "PROTEINUR", "UROBILINOGEN", "NITRITE", "LEUKOCYTESUR" in the last 72 hours.  Invalid input(s): "APPERANCEUR"    Imaging: CARDIAC CATHETERIZATION  Result Date: 10/25/2023   Hemodynamic findings consistent with mild pulmonary hypertension. 1.  Mild pulmonary hypertension (PA  mean 28 mmHg) 2.  Normal coronary anatomy Recommendations 1.  Good medical management     Medications:    sodium chloride     sodium chloride 0.216 mL/kg/hr (10/25/23 1230)    aspirin       aspirin EC  81 mg Oral Daily   carvedilol  25 mg Oral BID WC   Chlorhexidine Gluconate Cloth  6 each Topical Q0600   cinacalcet  120 mg Oral Q supper   furosemide  80 mg Oral Once per day on Sunday Tuesday Thursday Saturday   heparin  5,000 Units Subcutaneous Q8H   hydrALAZINE  50 mg Oral Q8H   losartan  50 mg Oral Daily   rosuvastatin  5 mg Oral Once per day on Sunday Tuesday Thursday Saturday   sevelamer carbonate  1,600 mg Oral TID WC   sodium chloride, acetaminophen, aspirin, hydrALAZINE, labetalol, ondansetron **OR** ondansetron (ZOFRAN) IV, mouth rinse  Assessment/ Plan:  77 y.o. female with past medical history of ESRD on HD, hypertension, hyperlipidemia, CVA, macular degeneration who was admitted for acute respiratory failure requiring BiPAP.  [facility]/MWF/[facility]/left upper extremity AV fistula  1.  ESRD on HD.  Patient completed dialysis treatment yesterday.  No acute indication for dialysis treatment today.  We will plan for hemodialysis treatment again tomorrow if still here.  2.  Acute respiratory failure/acute systolic and diastolic heart failure with ejection fraction of 35 to 40%..  Patient due for cardiac catheterization today.  3.  Anemia of chronic kidney disease.   Lab Results  Component Value Date   HGB 9.2 (L) 10/25/2023  Globin close to target at 9.2.  Hold off on Epogen for now.   4.  Secondary hyperparathyroidism.   Lab Results  Component Value Date   CALCIUM 8.3 (L) 10/25/2023   CAION 1.11 (L) 10/25/2023   PHOS 5.0 (H) 10/24/2023  Phosphorus acceptable at 5.0.  Continue Renvela as well as Sensipar.    LOS: 3 Devann Cribb 11/21/20241:22 PM

## 2023-10-26 ENCOUNTER — Telehealth: Payer: Self-pay | Admitting: Nurse Practitioner

## 2023-10-26 ENCOUNTER — Encounter: Payer: Self-pay | Admitting: Cardiology

## 2023-10-26 DIAGNOSIS — E8770 Fluid overload, unspecified: Secondary | ICD-10-CM | POA: Diagnosis not present

## 2023-10-26 LAB — POCT I-STAT 7, (LYTES, BLD GAS, ICA,H+H)
Acid-Base Excess: 2 mmol/L (ref 0.0–2.0)
Bicarbonate: 27.1 mmol/L (ref 20.0–28.0)
Calcium, Ion: 1.07 mmol/L — ABNORMAL LOW (ref 1.15–1.40)
HCT: 26 % — ABNORMAL LOW (ref 36.0–46.0)
Hemoglobin: 8.8 g/dL — ABNORMAL LOW (ref 12.0–15.0)
O2 Saturation: 96 %
Potassium: 4.4 mmol/L (ref 3.5–5.1)
Sodium: 135 mmol/L (ref 135–145)
TCO2: 28 mmol/L (ref 22–32)
pCO2 arterial: 46.2 mm[Hg] (ref 32–48)
pH, Arterial: 7.375 (ref 7.35–7.45)
pO2, Arterial: 88 mm[Hg] (ref 83–108)

## 2023-10-26 LAB — CBC
HCT: 28.3 % — ABNORMAL LOW (ref 36.0–46.0)
Hemoglobin: 9.2 g/dL — ABNORMAL LOW (ref 12.0–15.0)
MCH: 28.1 pg (ref 26.0–34.0)
MCHC: 32.5 g/dL (ref 30.0–36.0)
MCV: 86.5 fL (ref 80.0–100.0)
Platelets: 188 10*3/uL (ref 150–400)
RBC: 3.27 MIL/uL — ABNORMAL LOW (ref 3.87–5.11)
RDW: 16.5 % — ABNORMAL HIGH (ref 11.5–15.5)
WBC: 4.9 10*3/uL (ref 4.0–10.5)
nRBC: 0 % (ref 0.0–0.2)

## 2023-10-26 LAB — BASIC METABOLIC PANEL
Anion gap: 12 (ref 5–15)
BUN: 59 mg/dL — ABNORMAL HIGH (ref 8–23)
CO2: 26 mmol/L (ref 22–32)
Calcium: 8.5 mg/dL — ABNORMAL LOW (ref 8.9–10.3)
Chloride: 96 mmol/L — ABNORMAL LOW (ref 98–111)
Creatinine, Ser: 6.5 mg/dL — ABNORMAL HIGH (ref 0.44–1.00)
GFR, Estimated: 6 mL/min — ABNORMAL LOW (ref >60)
Glucose, Bld: 79 mg/dL (ref 70–99)
Potassium: 4.7 mmol/L (ref 3.5–5.1)
Sodium: 134 mmol/L — ABNORMAL LOW (ref 135–145)

## 2023-10-26 LAB — MAGNESIUM: Magnesium: 2.2 mg/dL (ref 1.7–2.4)

## 2023-10-26 MED ORDER — LOSARTAN POTASSIUM 100 MG PO TABS: 100.0000 mg | ORAL_TABLET | Freq: Every day | ORAL | 2 refills | Status: DC

## 2023-10-26 MED ORDER — ACETAMINOPHEN 500 MG PO TABS
ORAL_TABLET | ORAL | Status: AC
  Filled 2023-10-26: qty 2

## 2023-10-26 NOTE — Telephone Encounter (Signed)
Cardiology appointment 10/29/2023 @ [facility]

## 2023-10-26 NOTE — Discharge Summary (Signed)
Physician Discharge Summary   Kristen Francis  female DOB: Apr 19, 1946  QMV:[id number]  PCP: Sallyanne Kuster, NP  Admit date: 10/22/2023 Discharge date: 10/26/2023  Admitted From: home Disposition:  home CODE STATUS: Full code  Discharge Instructions     AMB Referral to Cardiac Rehabilitation - Phase II   Complete by: As directed    Diagnosis: Heart Failure (see criteria below if ordering Phase II)   Heart Failure Type: Chronic Systolic   After initial evaluation and assessments completed: Virtual Based Care may be provided alone or in conjunction with Phase 2 Cardiac Rehab based on patient barriers.: Yes   Intensive Cardiac Rehabilitation (ICR) [facility] location only OR Traditional Cardiac Rehabilitation (TCR) *If criteria for ICR are not met will enroll in TCR [facility] only): Yes   Diet - low sodium heart healthy   Complete by: As directed       Hospital Course:  For full details, please see H&P, progress notes, consult notes and ancillary notes.  Briefly,  Kristen Francis is a 77 y.o. female with medical history significant of ESRD, HTN, CVA, presenting with acute respiratory failure with hypoxia, volume overload.  Patient reports acute onset of shortness of breath.  Denies any missed episodes of dialysis.    Pulmonary edema:  as per CXR. Improved after urgent HD. --cont lasix --iHD for fluid removal   Acute combined CHF exacerbation:  no previous hx of CHF as per pt.  Cardio consulted. Echo showed reduced EF 35-40%, moderate pericardial effusion, grade II diastolic dysfunction, severe hypokinesis of LV, LA is severely dilated, RA is mildly dilated, mod-sever MR & severely dilated pulmon artery.  --right and left heart cath showed normal coronary, and mild pulm HTN --Continue on coreg, lasix. --home losartan increased from 50 to 100 mg daily. --Appointment made for follow up in clinic with Dr. Corky Sing next week as well as HF clinic appointment.    Moderate  pericardial effusion:  Per cardio, Likely 2/2 ESRD on HD.     Acute hypoxemic respiratory failure likely secondary to all above.  --needed BiPAP on presentation.  Weaned to room air prior to discharge.   ESRD on HD MWF.  --iHD per nephro   HTN:  continue on coreg, hydralazine, lasix --home losartan increased from 50 to 100 mg daily. --home amlodipine d/c'ed.   DM2:  well controlled, 4.6. Does not need SSI.    Hx of CVA:  continue on aspirin, statin    ACD:  likely secondary to ESRD.  No need for transfuse currently     Unless noted above, medications under "STOP" list are ones pt was not taking PTA.  Discharge Diagnoses:  Principal Problem:   Volume overload Active Problems:   Acute respiratory failure with hypoxia (HCC)   Essential hypertension   End stage renal disease (HCC)   Type 2 diabetes mellitus with other diabetic kidney complication (HCC)   Anemia in chronic renal disease   Stroke (HCC)   Gastroesophageal reflux disease without esophagitis   30 Day Unplanned Readmission Risk Score    Flowsheet Row ED to Hosp-Admission (Current) from 10/22/2023 in [facility]  30 Day Unplanned Readmission Risk Score (%) 26.64 Filed at 10/26/2023 0801       This score is the patient's risk of an unplanned readmission within 30 days of being discharged (0 -100%). The score is based on dignosis, age, lab data, medications, orders, and past utilization.   Low:  0-14.9  Medium: 15-21.9   High: 22-29.9   Extreme: 30 and above         Discharge Instructions:  Allergies as of 10/26/2023       Reactions   Other Other (See Comments)   Pt does not receive Blood    Penicillins Hives   Has patient had a PCN reaction causing immediate rash, facial/tongue/throat swelling, SOB or lightheadedness with hypotension: YES Has patient had a PCN reaction causing severe rash involving mucus membranes or skin necrosis: no Has patient had a PCN  reaction that required hospitalization: no Has patient had a PCN reaction occurring within the last 10 years: no If all of the above answers are "NO", then may proceed with Cephalosporin use.   Sodium Bicarbonate Itching        Medication List     STOP taking these medications    amLODipine 10 MG tablet Commonly known as: NORVASC   HYDROcodone-acetaminophen 5-325 MG tablet Commonly known as: NORCO/VICODIN   metoCLOPramide 5 MG tablet Commonly known as: REGLAN   predniSONE 50 MG tablet Commonly known as: DELTASONE       TAKE these medications    aspirin EC 81 MG tablet Take 1 tablet (81 mg total) by mouth daily. Swallow whole.   benzonatate 100 MG capsule Commonly known as: TESSALON Take 1 capsule (100 mg total) by mouth 2 (two) times daily as needed for cough.   carvedilol 25 MG tablet Commonly known as: COREG TAKE 1 TABLET(25 MG) BY MOUTH TWICE DAILY WITH A MEAL   cholecalciferol 1000 units tablet Commonly known as: VITAMIN D Take 1,000 Units by mouth daily.   cinacalcet 60 MG tablet Commonly known as: SENSIPAR Take 120 mg by mouth daily with supper.   dronabinol 5 MG capsule Commonly known as: MARINOL Take 1 capsule by mouth before meals twice daily on non-dialysis days   ferrous sulfate 325 (65 FE) MG tablet Take 1 tablet (325 mg total) by mouth 2 (two) times daily with a meal.   furosemide 80 MG tablet Commonly known as: LASIX Take 1 tablet (80 mg total) by mouth 4 (four) times a week. On non-dialysis days   hydrALAZINE 50 MG tablet Commonly known as: APRESOLINE TAKE 1 TABLET(50 MG) BY MOUTH THREE TIMES DAILY.   lidocaine-prilocaine cream Commonly known as: EMLA Apply 1 Application topically as directed.   losartan 100 MG tablet Commonly known as: COZAAR Take 1 tablet (100 mg total) by mouth daily. Increased from 50 mg. What changed:  medication strength See the new instructions.   omeprazole 40 MG capsule Commonly known as:  PRILOSEC TAKE 1 CAPSULE(40 MG) BY MOUTH DAILY   Renal-Vite 0.8 MG Tabs Take 0.8 mg by mouth every evening. On dialysis days   rosuvastatin 5 MG tablet Commonly known as: CRESTOR Take 5 mg by mouth See admin instructions. Take 1 tablet (5mg ) by mouth on non-dialysis days   sennosides-docusate sodium 8.6-50 MG tablet Commonly known as: SENOKOT-S Take 2 tablets by mouth daily.   sevelamer carbonate 800 MG tablet Commonly known as: RENVELA Take 1,600 mg by mouth 3 (three) times daily with meals.         Follow-up Information     Alwyn Pea, MD. Go in 1 week(s).   Specialties: Cardiology, Internal Medicine Contact information: [address] [place] [address] [phone]                 Allergies  Allergen Reactions   Other Other (See Comments)  Pt does not receive Blood    Penicillins Hives    Has patient had a PCN reaction causing immediate rash, facial/tongue/throat swelling, SOB or lightheadedness with hypotension: YES Has patient had a PCN reaction causing severe rash involving mucus membranes or skin necrosis: no Has patient had a PCN reaction that required hospitalization: no Has patient had a PCN reaction occurring within the last 10 years: no If all of the above answers are "NO", then may proceed with Cephalosporin use.    Sodium Bicarbonate Itching     The results of significant diagnostics from this hospitalization (including imaging, microbiology, ancillary and laboratory) are listed below for reference.   Consultations:   Procedures/Studies: CARDIAC CATHETERIZATION  Result Date: 10/25/2023   Hemodynamic findings consistent with mild pulmonary hypertension. 1.  Mild pulmonary hypertension (PA mean 28 mmHg) 2.  Normal coronary anatomy Recommendations 1.  Good medical management   ECHOCARDIOGRAM COMPLETE  Result Date: 10/22/2023    ECHOCARDIOGRAM REPORT   Patient Name:   KAYELEE ABID Hoglund Date of Exam: 10/22/2023  Medical Rec #:  [id number]            Height:       63.0 in Accession #:    [id number]           Weight:       114.8 lb Date of Birth:  1946/03/20            BSA:          1.527 m Patient Age:    77 years             BP:           155/62 mmHg Patient Gender: F                    HR:           79 bpm. Exam Location:  [facility] Procedure: 2D Echo, Cardiac Doppler, Color Doppler, 3D Echo and Strain Analysis Indications:     Acute respiratory distress  History:         Patient has no prior history of Echocardiogram examinations.                  Stroke and TIA, Signs/Symptoms:Fatigue and Shortness of Breath;                  Risk Factors:Hypertension and Diabetes. CKD.  Sonographer:     Mikki Harbor Referring Phys:  [phone] Francoise Schaumann NE[place]ON Diagnosing Phys: Lorine Bears MD  Sonographer Comments: Global longitudinal strain was attempted. IMPRESSIONS  1. Left ventricular ejection fraction, by estimation, is 35 to 40%. The left ventricle has moderately decreased function. The left ventricle demonstrates regional wall motion abnormalities (see scoring diagram/findings for description). The left ventricular internal cavity size was moderately dilated. There is moderate left ventricular hypertrophy. Left ventricular diastolic parameters are consistent with Grade II diastolic dysfunction (pseudonormalization). There is severe hypokinesis of the left ventricular, inferior wall and inferolateral wall. The average left ventricular global longitudinal strain is -13.2 %. The global longitudinal strain is abnormal.  2. Right ventricular systolic function is normal. The right ventricular size is normal. There is severely elevated pulmonary artery systolic pressure. The estimated right ventricular systolic pressure is 94.5 mmHg.  3. Left atrial size was severely dilated.  4. Right atrial size was mildly dilated.  5. Moderate pericardial effusion. The pericardial effusion is circumferential.  6. The mitral valve is normal in structure.  Moderate to  severe mitral valve regurgitation. No evidence of mitral stenosis. Moderate mitral annular calcification.  7. The aortic valve is normal in structure. Aortic valve regurgitation is moderate to severe. Aortic valve sclerosis/calcification is present, without any evidence of aortic stenosis.  8. Pulmonic valve regurgitation is moderate.  9. Severely dilated pulmonary artery. 10. The inferior vena cava is normal in size with <50% respiratory variability, suggesting right atrial pressure of 8 mmHg. FINDINGS  Left Ventricle: Left ventricular ejection fraction, by estimation, is 35 to 40%. The left ventricle has moderately decreased function. The left ventricle demonstrates regional wall motion abnormalities. The average left ventricular global longitudinal strain is -13.2 %. The global longitudinal strain is abnormal. The left ventricular internal cavity size was moderately dilated. There is moderate left ventricular hypertrophy. Left ventricular diastolic parameters are consistent with Grade II diastolic dysfunction (pseudonormalization). Right Ventricle: The right ventricular size is normal. No increase in right ventricular wall thickness. Right ventricular systolic function is normal. There is severely elevated pulmonary artery systolic pressure. The tricuspid regurgitant velocity is 4.65 m/s, and with an assumed right atrial pressure of 8 mmHg, the estimated right ventricular systolic pressure is 94.5 mmHg. Left Atrium: Left atrial size was severely dilated. Right Atrium: Right atrial size was mildly dilated. Pericardium: A moderately sized pericardial effusion is present. The pericardial effusion is circumferential. Mitral Valve: The mitral valve is normal in structure. There is moderate thickening of the mitral valve leaflet(s). There is mild calcification of the mitral valve leaflet(s). Moderate mitral annular calcification. Moderate to severe mitral valve regurgitation. No evidence of mitral valve  stenosis. MV peak gradient, 3.0 mmHg. The mean mitral valve gradient is 1.0 mmHg. Tricuspid Valve: The tricuspid valve is normal in structure. Tricuspid valve regurgitation is mild . No evidence of tricuspid stenosis. Aortic Valve: The aortic valve is normal in structure. Aortic valve regurgitation is moderate to severe. Aortic regurgitation PHT measures 519 msec. Aortic valve sclerosis/calcification is present, without any evidence of aortic stenosis. Aortic valve mean gradient measures 6.0 mmHg. Aortic valve peak gradient measures 14.4 mmHg. Aortic valve area, by VTI measures 1.95 cm. Pulmonic Valve: The pulmonic valve was normal in structure. Pulmonic valve regurgitation is moderate. No evidence of pulmonic stenosis. Aorta: The aortic root is normal in size and structure. Pulmonary Artery: The pulmonary artery is severely dilated. Venous: The inferior vena cava is normal in size with less than 50% respiratory variability, suggesting right atrial pressure of 8 mmHg. IAS/Shunts: No atrial level shunt detected by color flow Doppler. Additional Comments: There is a small pleural effusion in the left lateral region.  LEFT VENTRICLE PLAX 2D LVIDd:         6.00 cm      Diastology LVIDs:         4.90 cm      LV e' medial:    4.13 cm/s LV PW:         1.30 cm      LV E/e' medial:  20.2 LV IVS:        1.40 cm      LV e' lateral:   5.44 cm/s LVOT diam:     1.90 cm      LV E/e' lateral: 15.3 LV SV:         79 LV SV Index:   52           2D Longitudinal Strain LVOT Area:     2.84 cm     2D Strain GLS Avg:     -  13.2 %  LV Volumes (MOD) LV vol d, MOD A2C: 142.0 ml LV vol d, MOD A4C: 115.0 ml LV vol s, MOD A2C: 62.8 ml LV vol s, MOD A4C: 70.5 ml LV SV MOD A2C:     79.2 ml LV SV MOD A4C:     115.0 ml LV SV MOD BP:      61.9 ml RIGHT VENTRICLE RV Basal diam:  3.90 cm RV Mid diam:    3.30 cm RV S prime:     14.80 cm/s TAPSE (M-mode): 1.9 cm LEFT ATRIUM              Index        RIGHT ATRIUM           Index LA diam:        4.80 cm   3.14 cm/m   RA Area:     18.90 cm LA Vol (A2C):   144.0 ml 94.29 ml/m  RA Volume:   55.80 ml  36.54 ml/m LA Vol (A4C):   121.0 ml 79.23 ml/m LA Biplane Vol: 138.0 ml 90.36 ml/m  AORTIC VALVE                     PULMONIC VALVE AV Area (Vmax):    2.10 cm      PV Vmax:       1.14 m/s AV Area (Vmean):   1.91 cm      PV Peak grad:  5.2 mmHg AV Area (VTI):     1.95 cm AV Vmax:           190.00 cm/s AV Vmean:          115.000 cm/s AV VTI:            0.407 m AV Peak Grad:      14.4 mmHg AV Mean Grad:      6.0 mmHg LVOT Vmax:         141.00 cm/s LVOT Vmean:        77.300 cm/s LVOT VTI:          0.280 m LVOT/AV VTI ratio: 0.69 AI PHT:            519 msec  AORTA Ao Root diam: 3.60 cm Ao Asc diam:  3.40 cm MITRAL VALVE                  TRICUSPID VALVE MV Area (PHT): 3.70 cm       TR Peak grad:   86.5 mmHg MV Area VTI:   3.36 cm       TR Vmax:        465.00 cm/s MV Peak grad:  3.0 mmHg MV Mean grad:  1.0 mmHg       SHUNTS MV Vmax:       0.87 m/s       Systemic VTI:  0.28 m MV Vmean:      56.8 cm/s      Systemic Diam: 1.90 cm MV Decel Time: 205 msec MR Peak grad:    138.3 mmHg MR Mean grad:    92.0 mmHg MR Vmax:         588.00 cm/s MR Vmean:        457.0 cm/s MR PISA:         2.26 cm MR PISA Eff ROA: 36 mm MR PISA Radius:  0.60 cm MV E velocity: 83.30 cm/s MV A velocity: 93.30 cm/s MV E/A ratio:  0.89 Muhammad  Kirke Corin MD Electronically signed by Lorine Bears MD Signature Date/Time: 10/22/2023/5:06:31 PM    Final    DG Chest Portable 1 View  Result Date: 10/22/2023 CLINICAL DATA:  Respiratory distress. EXAM: PORTABLE CHEST 1 VIEW COMPARISON:  08/03/2023 FINDINGS: Cardiac enlargement. Aortic atherosclerosis. Small left pleural effusion is suspected. Diffuse increase interstitial markings identified bilaterally with bilateral patchy airspace opacities. There is advanced degenerative joint disease involving the glenohumeral and acromioclavicular joints. IMPRESSION: Cardiac enlargement, small left pleural effusion,  increased interstitial markings and bilateral patchy airspace opacities compatible with moderate pulmonary edema. Electronically Signed   By: Signa Kell M.D.   On: 10/22/2023 07:00   MM 3D SCREENING MAMMOGRAM BILATERAL BREAST  Result Date: 10/19/2023 CLINICAL DATA:  Screening. EXAM: DIGITAL SCREENING BILATERAL MAMMOGRAM WITH TOMOSYNTHESIS AND CAD TECHNIQUE: Bilateral screening digital craniocaudal and mediolateral oblique mammograms were obtained. Bilateral screening digital breast tomosynthesis was performed. The images were evaluated with computer-aided detection. COMPARISON:  Previous exam(s). ACR Breast Density Category c: The breasts are heterogeneously dense, which may obscure small masses. FINDINGS: There are no findings suspicious for malignancy. IMPRESSION: No mammographic evidence of malignancy. A result letter of this screening mammogram will be mailed directly to the patient. RECOMMENDATION: Screening mammogram in one year. (Code:SM-B-01Y) BI-RADS CATEGORY  1: Negative. Electronically Signed   By: Elberta Fortis M.D.   On: 10/19/2023 10:33   PERIPHERAL VASCULAR CATHETERIZATION  Result Date: 10/09/2023 See surgical note for result.     Labs: BNP (last 3 results) Recent Labs    10/22/23 0600  BNP >4,500.0*   Basic Metabolic Panel: Recent Labs  Lab 10/22/23 0600 10/22/23 0955 10/23/23 1953 10/24/23 1350 10/25/23 0317 10/25/23 1010 10/26/23 0424  NA 140  --  134* 135 136 134* 134*  K 4.1   < > 3.9 4.2 4.2 4.5 4.7  CL 95*  --  94* 94* 98  --  96*  CO2 27  --  30 29 27   --  26  GLUCOSE 143*  --  108* 100* 83  --  79  BUN 53*  --  48* 61* 35*  --  59*  CREATININE 7.10*  --  6.48* 7.91* 4.73*  --  6.50*  CALCIUM 9.1  --  8.4* 8.5* 8.3*  --  8.5*  MG  --   --   --   --  2.1  --  2.2  PHOS  --   --   --  5.0*  --   --   --    < > = values in this interval not displayed.   Liver Function Tests: Recent Labs  Lab 10/23/23 1953 10/24/23 1350  AST 13*  --   ALT 8  --    ALKPHOS 32*  --   BILITOT 0.7  --   PROT 5.9*  --   ALBUMIN 3.1* 2.9*   No results for input(s): "LIPASE", "AMYLASE" in the last 168 hours. No results for input(s): "AMMONIA" in the last 168 hours. CBC: Recent Labs  Lab 10/22/23 0600 10/23/23 1953 10/24/23 1350 10/25/23 0317 10/25/23 1010 10/26/23 0424  WBC 9.8 4.3 4.6 5.7  --  4.9  HGB 10.7* 9.3* 9.3* 9.1* 9.2* 9.2*  HCT 33.9* 29.0* 28.7* 28.1* 27.0* 28.3*  MCV 90.4 87.9 87.8 86.2  --  86.5  PLT 204 198 186 184  --  188   Cardiac Enzymes: No results for input(s): "CKTOTAL", "CKMB", "CKMBINDEX", "TROPONINI" in the last 168 hours. BNP: Invalid input(s): "POCBNP" CBG: Recent Labs  Lab 10/22/23 1158 10/22/23 1732 10/23/23 0058 10/23/23 0750 10/23/23 1152  GLUCAP 70 118* 92 126* 107*   D-Dimer No results for input(s): "DDIMER" in the last 72 hours. Hgb A1c No results for input(s): "HGBA1C" in the last 72 hours. Lipid Profile No results for input(s): "CHOL", "HDL", "LDLCALC", "TRIG", "CHOLHDL", "LDLDIRECT" in the last 72 hours. Thyroid function studies No results for input(s): "TSH", "T4TOTAL", "T3FREE", "THYROIDAB" in the last 72 hours.  Invalid input(s): "FREET3" Anemia work up No results for input(s): "VITAMINB12", "FOLATE", "FERRITIN", "TIBC", "IRON", "RETICCTPCT" in the last 72 hours. Urinalysis    Component Value Date/Time   COLORURINE YELLOW (A) 03/03/2023 1420   APPEARANCEUR Clear 05/24/2023 1542   LABSPEC 1.009 03/03/2023 1420   PHURINE 8.0 03/03/2023 1420   GLUCOSEU Negative 05/24/2023 1542   HGBUR NEGATIVE 03/03/2023 1420   BILIRUBINUR Negative 05/24/2023 1542   KETONESUR NEGATIVE 03/03/2023 1420   PROTEINUR 3+ (A) 05/24/2023 1542   PROTEINUR >=300 (A) 03/03/2023 1420   NITRITE Negative 05/24/2023 1542   NITRITE NEGATIVE 03/03/2023 1420   LEUKOCYTESUR Negative 05/24/2023 1542   LEUKOCYTESUR NEGATIVE 03/03/2023 1420   Sepsis Labs Recent Labs  Lab 10/23/23 1953 10/24/23 1350 10/25/23 0317  10/26/23 0424  WBC 4.3 4.6 5.7 4.9   Microbiology Recent Results (from the past 240 hour(s))  MRSA Next Gen by PCR, Nasal     Status: None   Collection Time: 10/22/23  9:55 AM   Specimen: Nasal Mucosa; Nasal Swab  Result Value Ref Range Status   MRSA by PCR Next Gen NOT DETECTED NOT DETECTED Final    Comment: (NOTE) The GeneXpert MRSA Assay (FDA approved for NASAL specimens only), is one component of a comprehensive MRSA colonization surveillance program. It is not intended to diagnose MRSA infection nor to guide or monitor treatment for MRSA infections. Test performance is not FDA approved in patients less than 25 years old. Performed at [facility], [address] [place], [place] [address]      Total time spend on discharging this patient, including the last patient exam, discussing the hospital stay, instructions for ongoing care as it relates to all pertinent caregivers, as well as preparing the medical discharge records, prescriptions, and/or referrals as applicable, is 35 minutes.    Darlin Priestly, MD  [facility] 10/26/2023, 11:06 AM

## 2023-10-26 NOTE — Progress Notes (Cosign Needed Addendum)
[facility] CLINIC CARDIOLOGY PROGRESS NOTE       Patient ID: Kristen Francis MRN: [id number] DOB/AGE: 76-Jun-1947 76 y.o.  Admit date: 10/22/2023 Referring Physician Fabienne Bruns, MD Primary Physician Sallyanne Kuster, NP Primary Cardiologist Windell Norfolk, MD  Reason for Consultation AoCHF, moderate pericardial effusion  HPI: [name] is a 77 y.o. female  with a past medical history of ESRD on HD (AV fistula on left upper arm; M/W/F), hypertension, hyperlipidemia, CVA, GERD, anemia who presented to the ED on 10/22/2023 for respiratory distress. Cardiology was consulted for further evaluation.   Interval history: -Patient reports she is feeling okay, fatigued after dialysis today.  -SOB significantly improved. Saturating well on room air. -BP improved overall. HR stable.   Review of systems complete and found to be negative unless listed above    Past Medical History:  Diagnosis Date   Anemia    Arthritis    Chronic kidney disease    ESRD   Complication of anesthesia    had procedure and felt incision early 80s   Gout    Hyperlipemia    Hypertension    Macular degeneration, right eye    Stroke [facility])     Past Surgical History:  Procedure Laterality Date   A/V FISTULAGRAM Left 12/25/2018   Procedure: A/V FISTULAGRAM;  Surgeon: Renford Dills, MD;  Location: [facility] INVASIVE CV LAB;  Service: Cardiovascular;  Laterality: Left;   A/V FISTULAGRAM Left 10/09/2023   Procedure: A/V Fistulagram;  Surgeon: Renford Dills, MD;  Location: [facility] INVASIVE CV LAB;  Service: Cardiovascular;  Laterality: Left;   ABDOMINAL HYSTERECTOMY     APPENDECTOMY     AV FISTULA PLACEMENT Left 07/12/2018   Procedure: INSERTION OF ARTERIOVENOUS (AV) GORE-TEX GRAFT ARM ( BRACHIAL AXILLARY );  Surgeon: Renford Dills, MD;  Location: [facility] ORS;  Service: Vascular;  Laterality: Left;   COLONOSCOPY WITH PROPOFOL N/A 01/14/2016   Procedure: COLONOSCOPY WITH PROPOFOL;  Surgeon:  Scot Jun, MD;  Location: [facility] ENDOSCOPY;  Service: Endoscopy;  Laterality: N/A;   COLONOSCOPY WITH PROPOFOL N/A 02/14/2022   Procedure: COLONOSCOPY WITH PROPOFOL;  Surgeon: Regis Bill, MD;  Location: [facility] ENDOSCOPY;  Service: Endoscopy;  Laterality: N/A;   RIGHT/LEFT HEART CATH AND CORONARY ANGIOGRAPHY N/A 10/25/2023   Procedure: RIGHT/LEFT HEART CATH AND CORONARY ANGIOGRAPHY;  Surgeon: Marcina Millard, MD;  Location: [facility] INVASIVE CV LAB;  Service: Cardiovascular;  Laterality: N/A;   TONSILLECTOMY      Medications Prior to Admission  Medication Sig Dispense Refill Last Dose   amLODipine (NORVASC) 10 MG tablet TAKE 1 TABLET(10 MG) BY MOUTH DAILY 90 tablet 3 10/21/2023 at Unknown   aspirin EC 81 MG EC tablet Take 1 tablet (81 mg total) by mouth daily. Swallow whole. 30 tablet 0    B Complex-C-Folic Acid (RENAL-VITE) 0.8 MG TABS Take 0.8 mg by mouth every evening. On dialysis days      benzonatate (TESSALON) 100 MG capsule Take 1 capsule (100 mg total) by mouth 2 (two) times daily as needed for cough. 60 capsule 3 Unknown at PRN   carvedilol (COREG) 25 MG tablet TAKE 1 TABLET(25 MG) BY MOUTH TWICE DAILY WITH A MEAL 180 tablet 3 10/21/2023 at Unknown   cholecalciferol (VITAMIN D) 1000 units tablet Take 1,000 Units by mouth daily.       cinacalcet (SENSIPAR) 60 MG tablet Take 120 mg by mouth daily with supper.   10/21/2023 at 1800   ferrous sulfate 325 (65 FE) MG tablet Take  1 tablet (325 mg total) by mouth 2 (two) times daily with a meal. 60 tablet 2    furosemide (LASIX) 80 MG tablet Take 1 tablet (80 mg total) by mouth 4 (four) times a week. On non-dialysis days 16 tablet 2 10/21/2023 at Unknown   hydrALAZINE (APRESOLINE) 50 MG tablet TAKE 1 TABLET(50 MG) BY MOUTH THREE TIMES DAILY. 90 tablet 2 10/21/2023 at Unknown   lidocaine-prilocaine (EMLA) cream Apply 1 Application topically as directed.  6    omeprazole (PRILOSEC) 40 MG capsule TAKE 1 CAPSULE(40 MG) BY MOUTH DAILY 90  capsule 3    rosuvastatin (CRESTOR) 5 MG tablet Take 5 mg by mouth See admin instructions. Take 1 tablet (5mg ) by mouth on non-dialysis days   10/21/2023 at Unknown   sennosides-docusate sodium (SENOKOT-S) 8.6-50 MG tablet Take 2 tablets by mouth daily.      sevelamer carbonate (RENVELA) 800 MG tablet Take 1,600 mg by mouth 3 (three) times daily with meals.   10/21/2023 at 1800   [DISCONTINUED] losartan (COZAAR) 50 MG tablet TAKE 1 TABLET(50 MG) BY MOUTH DAILY 90 tablet 3 10/21/2023 at Unknown   dronabinol (MARINOL) 5 MG capsule Take 1 capsule by mouth before meals twice daily on non-dialysis days 40 capsule 2    HYDROcodone-acetaminophen (NORCO/VICODIN) 5-325 MG tablet Take 1 tablet by mouth every 4 (four) hours as needed for moderate pain. (Patient not taking: Reported on 10/22/2023) 10 tablet 0 Not Taking   metoCLOPramide (REGLAN) 5 MG tablet Take 1 tablet (5 mg total) by mouth 4 (four) times daily -  before meals and at bedtime. (Patient not taking: Reported on 10/22/2023) 120 tablet 1 Not Taking   predniSONE (DELTASONE) 50 MG tablet Take 1 tablet (50 mg total) by mouth daily with breakfast. (Patient not taking: Reported on 10/22/2023) 5 tablet 0 Not Taking   Social History   Socioeconomic History   Marital status: Married    Spouse name: Not on file   Number of children: Not on file   Years of education: Not on file   Highest education level: Not on file  Occupational History   Not on file  Tobacco Use   Smoking status: Former   Smokeless tobacco: Never  Vaping Use   Vaping status: Never Used  Substance and Sexual Activity   Alcohol use: No   Drug use: No   Sexual activity: Not on file  Other Topics Concern   Not on file  Social History Narrative   Lives in  [place]; used to clean; no smoking; no alcohol.    Social Determinants of Health   Financial Resource Strain: Not on file  Food Insecurity: No Food Insecurity (10/22/2023)   Hunger Vital Sign    Worried About  Running Out of Food in the Last Year: Never true    Ran Out of Food in the Last Year: Never true  Transportation Needs: No Transportation Needs (10/22/2023)   PRAPARE - Administrator, Civil Service (Medical): No    Lack of Transportation (Non-Medical): No  Physical Activity: Not on file  Stress: Not on file  Social Connections: Not on file  Intimate Partner Violence: Not At Risk (10/22/2023)   Humiliation, Afraid, Rape, and Kick questionnaire    Fear of Current or Ex-Partner: No    Emotionally Abused: No    Physically Abused: No    Sexually Abused: No    Family History  Problem Relation Age of Onset   Kidney disease Mother  Breast cancer Neg Hx      Vitals:   10/26/23 1230 10/26/23 1330 10/26/23 1345 10/26/23 1356  BP: 139/77  (!) 145/63   Pulse: 71 70 67   Resp: (!) 21  19   Temp:   98 F (36.7 C)   TempSrc:   Oral   SpO2: 100% 100% 100%   Weight:    44.9 kg  Height:        PHYSICAL EXAM General: Chronically-ill appearing elderly female, well nourished, in no acute distress sitting upright in bedside chair HEENT: Normocephalic and atraumatic. Neck: No JVD.   Lungs: Normal respiratory effort on room air.  Mild crackles bilaterally. Heart: HRRR. Normal S1 and S2 without gallops or murmurs.  Abdomen: Non-distended appearing.  Msk: Normal strength and tone for age. Extremities: Warm and well perfused. No clubbing, cyanosis. No edema.  Neuro: Alert and oriented X 3. Psych: Answers questions appropriately.   Labs: Basic Metabolic Panel: Recent Labs    10/24/23 1350 10/25/23 0317 10/25/23 1010 10/25/23 1018 10/26/23 0424  NA 135 136   < > 135 134*  K 4.2 4.2   < > 4.4 4.7  CL 94* 98  --   --  96*  CO2 29 27  --   --  26  GLUCOSE 100* 83  --   --  79  BUN 61* 35*  --   --  59*  CREATININE 7.91* 4.73*  --   --  6.50*  CALCIUM 8.5* 8.3*  --   --  8.5*  MG  --  2.1  --   --  2.2  PHOS 5.0*  --   --   --   --    < > = values in this interval not  displayed.   Liver Function Tests: Recent Labs    10/23/23 1953 10/24/23 1350  AST 13*  --   ALT 8  --   ALKPHOS 32*  --   BILITOT 0.7  --   PROT 5.9*  --   ALBUMIN 3.1* 2.9*   No results for input(s): "LIPASE", "AMYLASE" in the last 72 hours. CBC: Recent Labs    10/25/23 0317 10/25/23 1010 10/25/23 1018 10/26/23 0424  WBC 5.7  --   --  4.9  HGB 9.1*   < > 8.8* 9.2*  HCT 28.1*   < > 26.0* 28.3*  MCV 86.2  --   --  86.5  PLT 184  --   --  188   < > = values in this interval not displayed.   Cardiac Enzymes: No results for input(s): "CKTOTAL", "CKMB", "CKMBINDEX", "TROPONINIHS" in the last 72 hours.  BNP: No results for input(s): "BNP" in the last 72 hours.  D-Dimer: No results for input(s): "DDIMER" in the last 72 hours. Hemoglobin A1C: No results for input(s): "HGBA1C" in the last 72 hours.  Fasting Lipid Panel: No results for input(s): "CHOL", "HDL", "LDLCALC", "TRIG", "CHOLHDL", "LDLDIRECT" in the last 72 hours. Thyroid Function Tests: No results for input(s): "TSH", "T4TOTAL", "T3FREE", "THYROIDAB" in the last 72 hours.  Invalid input(s): "FREET3" Anemia Panel: No results for input(s): "VITAMINB12", "FOLATE", "FERRITIN", "TIBC", "IRON", "RETICCTPCT" in the last 72 hours.   Radiology: CARDIAC CATHETERIZATION  Result Date: 10/25/2023   Hemodynamic findings consistent with mild pulmonary hypertension. 1.  Mild pulmonary hypertension (PA mean 28 mmHg) 2.  Normal coronary anatomy Recommendations 1.  Good medical management   ECHOCARDIOGRAM COMPLETE  Result Date: 10/22/2023    ECHOCARDIOGRAM REPORT  Patient Name:   MADALYNN PIZANO [facility] Date of Exam: 10/22/2023 Medical Rec #:  [id number]            Height:       63.0 in Accession #:    [id number]           Weight:       114.8 lb Date of Birth:  Oct 23, 1946            BSA:          1.527 m Patient Age:    77 years             BP:           155/62 mmHg Patient Gender: F                    HR:           79 bpm. Exam  Location:  [facility] Procedure: 2D Echo, Cardiac Doppler, Color Doppler, 3D Echo and Strain Analysis Indications:     Acute respiratory distress  History:         Patient has no prior history of Echocardiogram examinations.                  Stroke and TIA, Signs/Symptoms:Fatigue and Shortness of Breath;                  Risk Factors:Hypertension and Diabetes. CKD.  Sonographer:     Mikki Harbor Referring Phys:  [phone] Francoise Schaumann [place]ON Diagnosing Phys: Lorine Bears MD  Sonographer Comments: Global longitudinal strain was attempted. IMPRESSIONS  1. Left ventricular ejection fraction, by estimation, is 35 to 40%. The left ventricle has moderately decreased function. The left ventricle demonstrates regional wall motion abnormalities (see scoring diagram/findings for description). The left ventricular internal cavity size was moderately dilated. There is moderate left ventricular hypertrophy. Left ventricular diastolic parameters are consistent with Grade II diastolic dysfunction (pseudonormalization). There is severe hypokinesis of the left ventricular, inferior wall and inferolateral wall. The average left ventricular global longitudinal strain is -13.2 %. The global longitudinal strain is abnormal.  2. Right ventricular systolic function is normal. The right ventricular size is normal. There is severely elevated pulmonary artery systolic pressure. The estimated right ventricular systolic pressure is 94.5 mmHg.  3. Left atrial size was severely dilated.  4. Right atrial size was mildly dilated.  5. Moderate pericardial effusion. The pericardial effusion is circumferential.  6. The mitral valve is normal in structure. Moderate to severe mitral valve regurgitation. No evidence of mitral stenosis. Moderate mitral annular calcification.  7. The aortic valve is normal in structure. Aortic valve regurgitation is moderate to severe. Aortic valve sclerosis/calcification is present, without any evidence of aortic stenosis.  8.  Pulmonic valve regurgitation is moderate.  9. Severely dilated pulmonary artery. 10. The inferior vena cava is normal in size with <50% respiratory variability, suggesting right atrial pressure of 8 mmHg. FINDINGS  Left Ventricle: Left ventricular ejection fraction, by estimation, is 35 to 40%. The left ventricle has moderately decreased function. The left ventricle demonstrates regional wall motion abnormalities. The average left ventricular global longitudinal strain is -13.2 %. The global longitudinal strain is abnormal. The left ventricular internal cavity size was moderately dilated. There is moderate left ventricular hypertrophy. Left ventricular diastolic parameters are consistent with Grade II diastolic dysfunction (pseudonormalization). Right Ventricle: The right ventricular size is normal. No increase in right ventricular wall thickness. Right ventricular systolic function is normal. There is  severely elevated pulmonary artery systolic pressure. The tricuspid regurgitant velocity is 4.65 m/s, and with an assumed right atrial pressure of 8 mmHg, the estimated right ventricular systolic pressure is 94.5 mmHg. Left Atrium: Left atrial size was severely dilated. Right Atrium: Right atrial size was mildly dilated. Pericardium: A moderately sized pericardial effusion is present. The pericardial effusion is circumferential. Mitral Valve: The mitral valve is normal in structure. There is moderate thickening of the mitral valve leaflet(s). There is mild calcification of the mitral valve leaflet(s). Moderate mitral annular calcification. Moderate to severe mitral valve regurgitation. No evidence of mitral valve stenosis. MV peak gradient, 3.0 mmHg. The mean mitral valve gradient is 1.0 mmHg. Tricuspid Valve: The tricuspid valve is normal in structure. Tricuspid valve regurgitation is mild . No evidence of tricuspid stenosis. Aortic Valve: The aortic valve is normal in structure. Aortic valve regurgitation is  moderate to severe. Aortic regurgitation PHT measures 519 msec. Aortic valve sclerosis/calcification is present, without any evidence of aortic stenosis. Aortic valve mean gradient measures 6.0 mmHg. Aortic valve peak gradient measures 14.4 mmHg. Aortic valve area, by VTI measures 1.95 cm. Pulmonic Valve: The pulmonic valve was normal in structure. Pulmonic valve regurgitation is moderate. No evidence of pulmonic stenosis. Aorta: The aortic root is normal in size and structure. Pulmonary Artery: The pulmonary artery is severely dilated. Venous: The inferior vena cava is normal in size with less than 50% respiratory variability, suggesting right atrial pressure of 8 mmHg. IAS/Shunts: No atrial level shunt detected by color flow Doppler. Additional Comments: There is a small pleural effusion in the left lateral region.  LEFT VENTRICLE PLAX 2D LVIDd:         6.00 cm      Diastology LVIDs:         4.90 cm      LV e' medial:    4.13 cm/s LV PW:         1.30 cm      LV E/e' medial:  20.2 LV IVS:        1.40 cm      LV e' lateral:   5.44 cm/s LVOT diam:     1.90 cm      LV E/e' lateral: 15.3 LV SV:         79 LV SV Index:   52           2D Longitudinal Strain LVOT Area:     2.84 cm     2D Strain GLS Avg:     -13.2 %  LV Volumes (MOD) LV vol d, MOD A2C: 142.0 ml LV vol d, MOD A4C: 115.0 ml LV vol s, MOD A2C: 62.8 ml LV vol s, MOD A4C: 70.5 ml LV SV MOD A2C:     79.2 ml LV SV MOD A4C:     115.0 ml LV SV MOD BP:      61.9 ml RIGHT VENTRICLE RV Basal diam:  3.90 cm RV Mid diam:    3.30 cm RV S prime:     14.80 cm/s TAPSE (M-mode): 1.9 cm LEFT ATRIUM              Index        RIGHT ATRIUM           Index LA diam:        4.80 cm  3.14 cm/m   RA Area:     18.90 cm LA Vol (A2C):   144.0 ml 94.29 ml/m  RA Volume:  55.80 ml  36.54 ml/m LA Vol (A4C):   121.0 ml 79.23 ml/m LA Biplane Vol: 138.0 ml 90.36 ml/m  AORTIC VALVE                     PULMONIC VALVE AV Area (Vmax):    2.10 cm      PV Vmax:       1.14 m/s AV Area  (Vmean):   1.91 cm      PV Peak grad:  5.2 mmHg AV Area (VTI):     1.95 cm AV Vmax:           190.00 cm/s AV Vmean:          115.000 cm/s AV VTI:            0.407 m AV Peak Grad:      14.4 mmHg AV Mean Grad:      6.0 mmHg LVOT Vmax:         141.00 cm/s LVOT Vmean:        77.300 cm/s LVOT VTI:          0.280 m LVOT/AV VTI ratio: 0.69 AI PHT:            519 msec  AORTA Ao Root diam: 3.60 cm Ao Asc diam:  3.40 cm MITRAL VALVE                  TRICUSPID VALVE MV Area (PHT): 3.70 cm       TR Peak grad:   86.5 mmHg MV Area VTI:   3.36 cm       TR Vmax:        465.00 cm/s MV Peak grad:  3.0 mmHg MV Mean grad:  1.0 mmHg       SHUNTS MV Vmax:       0.87 m/s       Systemic VTI:  0.28 m MV Vmean:      56.8 cm/s      Systemic Diam: 1.90 cm MV Decel Time: 205 msec MR Peak grad:    138.3 mmHg MR Mean grad:    92.0 mmHg MR Vmax:         588.00 cm/s MR Vmean:        457.0 cm/s MR PISA:         2.26 cm MR PISA Eff ROA: 36 mm MR PISA Radius:  0.60 cm MV E velocity: 83.30 cm/s MV A velocity: 93.30 cm/s MV E/A ratio:  0.89 Lorine Bears MD Electronically signed by Lorine Bears MD Signature Date/Time: 10/22/2023/5:06:31 PM    Final    DG Chest Portable 1 View  Result Date: 10/22/2023 CLINICAL DATA:  Respiratory distress. EXAM: PORTABLE CHEST 1 VIEW COMPARISON:  08/03/2023 FINDINGS: Cardiac enlargement. Aortic atherosclerosis. Small left pleural effusion is suspected. Diffuse increase interstitial markings identified bilaterally with bilateral patchy airspace opacities. There is advanced degenerative joint disease involving the glenohumeral and acromioclavicular joints. IMPRESSION: Cardiac enlargement, small left pleural effusion, increased interstitial markings and bilateral patchy airspace opacities compatible with moderate pulmonary edema. Electronically Signed   By: Signa Kell M.D.   On: 10/22/2023 07:00   MM 3D SCREENING MAMMOGRAM BILATERAL BREAST  Result Date: 10/19/2023 CLINICAL DATA:  Screening. EXAM:  DIGITAL SCREENING BILATERAL MAMMOGRAM WITH TOMOSYNTHESIS AND CAD TECHNIQUE: Bilateral screening digital craniocaudal and mediolateral oblique mammograms were obtained. Bilateral screening digital breast tomosynthesis was performed. The images were evaluated with computer-aided detection. COMPARISON:  Previous exam(s). ACR Breast Density Category  c: The breasts are heterogeneously dense, which may obscure small masses. FINDINGS: There are no findings suspicious for malignancy. IMPRESSION: No mammographic evidence of malignancy. A result letter of this screening mammogram will be mailed directly to the patient. RECOMMENDATION: Screening mammogram in one year. (Code:SM-B-01Y) BI-RADS CATEGORY  1: Negative. Electronically Signed   By: Elberta Fortis M.D.   On: 10/19/2023 10:33   PERIPHERAL VASCULAR CATHETERIZATION  Result Date: 10/09/2023 See surgical note for result.   ECHO As above (EF 35-40%, LV wall abnormalities, severely elevated pulmonary artery systolic pressure, moderate pericardial effusion)  TELEMETRY reviewed by me 10/26/2023: SR, rate 60s occasional PVCs  EKG reviewed by me: sinus rhythm with multiple PVCs, rate 93 bpm  Data reviewed by me 10/26/2023: last 24h vitals tele labs imaging I/O hospitalist progress note, nephrology note.  Principal Problem:   Volume overload Active Problems:   Anemia in chronic renal disease   Essential hypertension   Stroke (HCC)   End stage renal disease (HCC)   Type 2 diabetes mellitus with other diabetic kidney complication (HCC)   Gastroesophageal reflux disease without esophagitis   Acute respiratory failure with hypoxia (HCC)    ASSESSMENT AND PLAN:  Kristen Francis is a 77 y.o. female  with a past medical history of ESRD on HD (AV fistula on left upper arm; M/W/F), hypertension, hyperlipidemia, CVA, GERD, anemia  who presented to the ED on 10/22/2023 for respiratory distress. Cardiology was consulted for further evaluation.   # Acute  respiratory failure with hypoxia  # Acute HFrEF (EF 35-40% on admission) # Pericardial effusion  # Mild pulmonary hypertension Patient presented to ED in acute respiratory distress, was placed on BiPAP. Patient has been experiencing shortness of breath for past 3 days, worse with exertion.  BNP > 4,500. Troponin mildly elevated 31 > 46. EKG in ED showed sinus rhythm with multiple PVCs, rate 93 bpm. Echo showed EF 35-40% with LV wall motion abnormalities, severely elevated pulmonary artery pressure, and moderate pericardial effusion. This is possibly newly reduced EF, have no other echo to compare. R+LHC today with normal coronaries, mildly elevated right heart pressures and mild pulmonary hypertension.  -Wean O2 as able. -Continue losartan 100 mg daily. Continue carvedilol 25 mg daily, hydralazine 50 mg 3 times daily. Consider further additions to GDMT outpatient pending BP. -Continue Lasix 80 mg on nondialysis days. -Continue aspirin 81 mg daily. -Echo reviewed by Dr. Darrold Junker, pericardial effusion mild to moderate. Likely 2/2 ESRD on HD.   # Hypertension # Hyperlipidemia BP elevated since admission, improved this AM. -See plan for meds as above. -Continue rosuvastatin 5 mg on nondialysis days.  #ESRD on HD # Anemia Patient was on her way to dialysis 11/18 and developed acute respiratory distress which brought her to the ED. Patient has dialysis M,W,F. In ED, Cr 7.10 with stable Na, K. Patient had dialysis 11/18, took off 3L. Dialysis 11/20, took 1.5 L off.  -Management per nephrology.   Ok for discharge today from a cardiac perspective. Appointment made for follow up in clinic with Dr. Corky Sing next week as well as HF clinic appointment.  This patient's plan of care was discussed and created with Dr. Darrold Junker and he is in agreement.  Signed: Gale Journey, PA-C  10/26/2023, 2:52 PM [facility] Cardiology

## 2023-10-26 NOTE — Progress Notes (Signed)
[facility]  ROUNDING NOTE   Subjective:   Patient well-known to [place] from prior admissions. Normally goes to [facility]. Last treatment prior to admission was 10/19/2023.  Update:  Patient seen during dialysis Tolerating well    HEMODIALYSIS FLOWSHEET:  Blood Flow Rate (mL/min): 400 mL/min Arterial Pressure (mmHg): -190.7 mmHg Venous Pressure (mmHg): 188.68 mmHg TMP (mmHg): 7.47 mmHg Ultrafiltration Rate (mL/min): 829 mL/min Dialysate Flow Rate (mL/min): 299 ml/min  He had a good breakfast this morning.  Denies any complaints.    Objective:  Vital signs in last 24 hours:  Temp:  [97 F (36.1 C)-98.6 F (37 C)] 97 F (36.1 C) (11/22 0957) Pulse Rate:  [60-74] 63 (11/22 1100) Resp:  [14-20] 19 (11/22 1100) BP: (114-179)/(54-84) 136/56 (11/22 1100) SpO2:  [94 %-100 %] 100 % (11/22 1100) Weight:  [46.7 kg] 46.7 kg (11/22 1001)  Weight change: -1.5 kg Filed Weights   10/24/23 1743 10/25/23 0843 10/26/23 1001  Weight: 46.2 kg 46.2 kg 46.7 kg    Intake/Output: No intake/output data recorded.   Intake/Output this shift:  No intake/output data recorded.  Physical Exam: General: No acute distress  Head: Normocephalic, atraumatic. Moist oral mucosal membranes  Neck: Supple  Lungs:  Normal effort on room air    Heart: S1S2 no rubs  Abdomen:  Soft, nontender, bowel sounds present  Extremities: Trace peripheral edema.  Neurologic: Awake, alert, following commands  Skin: No acute rash  Access: Left upper extremity AV fistula    Basic Metabolic Panel: Recent Labs  Lab 10/22/23 0600 10/22/23 0955 10/23/23 1953 10/24/23 1350 10/25/23 0317 10/25/23 1010 10/26/23 0424  NA 140  --  134* 135 136 134* 134*  K 4.1   < > 3.9 4.2 4.2 4.5 4.7  CL 95*  --  94* 94* 98  --  96*  CO2 27  --  30 29 27   --  26  GLUCOSE 143*  --  108* 100* 83  --  79  BUN 53*  --  48* 61* 35*  --  59*  CREATININE 7.10*  --  6.48* 7.91* 4.73*  --  6.50*   CALCIUM 9.1  --  8.4* 8.5* 8.3*  --  8.5*  MG  --   --   --   --  2.1  --  2.2  PHOS  --   --   --  5.0*  --   --   --    < > = values in this interval not displayed.    Liver Function Tests: Recent Labs  Lab 10/23/23 1953 10/24/23 1350  AST 13*  --   ALT 8  --   ALKPHOS 32*  --   BILITOT 0.7  --   PROT 5.9*  --   ALBUMIN 3.1* 2.9*   No results for input(s): "LIPASE", "AMYLASE" in the last 168 hours. No results for input(s): "AMMONIA" in the last 168 hours.  CBC: Recent Labs  Lab 10/22/23 0600 10/23/23 1953 10/24/23 1350 10/25/23 0317 10/25/23 1010 10/26/23 0424  WBC 9.8 4.3 4.6 5.7  --  4.9  HGB 10.7* 9.3* 9.3* 9.1* 9.2* 9.2*  HCT 33.9* 29.0* 28.7* 28.1* 27.0* 28.3*  MCV 90.4 87.9 87.8 86.2  --  86.5  PLT 204 198 186 184  --  188    Cardiac Enzymes: No results for input(s): "CKTOTAL", "CKMB", "CKMBINDEX", "TROPONINI" in the last 168 hours.  BNP: Invalid input(s): "POCBNP"  CBG: Recent Labs  Lab 10/22/23 1158  10/22/23 1732 10/23/23 0058 10/23/23 0750 10/23/23 1152  GLUCAP 70 118* 92 126* 107*    Microbiology: Results for orders placed or performed during the hospital encounter of 10/22/23  MRSA Next Gen by PCR, Nasal     Status: None   Collection Time: 10/22/23  9:55 AM   Specimen: Nasal Mucosa; Nasal Swab  Result Value Ref Range Status   MRSA by PCR Next Gen NOT DETECTED NOT DETECTED Final    Comment: (NOTE) The GeneXpert MRSA Assay (FDA approved for NASAL specimens only), is one component of a comprehensive MRSA colonization surveillance program. It is not intended to diagnose MRSA infection nor to guide or monitor treatment for MRSA infections. Test performance is not FDA approved in patients less than 47 years old. Performed at [facility], [address] [place], [place] [address]     Coagulation Studies: No results for input(s): "LABPROT", "INR" in the last 72 hours.  Urinalysis: No results for input(s): "COLORURINE",  "LABSPEC", "PHURINE", "GLUCOSEU", "HGBUR", "BILIRUBINUR", "KETONESUR", "PROTEINUR", "UROBILINOGEN", "NITRITE", "LEUKOCYTESUR" in the last 72 hours.  Invalid input(s): "APPERANCEUR"    Imaging: CARDIAC CATHETERIZATION  Result Date: 10/25/2023   Hemodynamic findings consistent with mild pulmonary hypertension. 1.  Mild pulmonary hypertension (PA mean 28 mmHg) 2.  Normal coronary anatomy Recommendations 1.  Good medical management     Medications:    sodium chloride      aspirin EC  81 mg Oral Daily   carvedilol  25 mg Oral BID WC   Chlorhexidine Gluconate Cloth  6 each Topical Q0600   cinacalcet  120 mg Oral Q supper   furosemide  80 mg Oral Once per day on Sunday Tuesday Thursday Saturday   heparin  5,000 Units Subcutaneous Q8H   hydrALAZINE  50 mg Oral Q8H   losartan  100 mg Oral Daily   rosuvastatin  5 mg Oral Once per day on Sunday Tuesday Thursday Saturday   sevelamer carbonate  1,600 mg Oral TID WC   sodium chloride, acetaminophen, ondansetron **OR** ondansetron (ZOFRAN) IV, mouth rinse  Assessment/ Plan:  77 y.o. female with past medical history of ESRD on HD, hypertension, hyperlipidemia, CVA, macular degeneration who was admitted for acute respiratory failure requiring BiPAP.  [facility]/MWF/[facility]/left upper extremity AV fistula  1.  ESRD on HD.  Patient is due for dialysis today.  Continue MWF schedule.  2.  Acute respiratory failure/acute systolic and diastolic heart failure with ejection fraction of 35 to 40%.  Right heart cath consistent with mild pulmonary hypertension Volume control. Current regimen includes carvedilol, hydralazine, losartan, rosuvastatin. Furosemide 80 mg on nondialysis days.  3.  Anemia of chronic kidney disease.   Lab Results  Component Value Date   HGB 9.2 (L) 10/26/2023  Hgb close to target. hold off on Epogen for now.   4.  Secondary hyperparathyroidism.   Lab Results  Component Value Date   CALCIUM 8.5 (L)  10/26/2023   CAION 1.11 (L) 10/25/2023   PHOS 5.0 (H) 10/24/2023  Phosphorus acceptable at 5.0.  Continue Renvela as well as Sensipar.    LOS: 4 Kristen Francis 11/22/202411:05 AM

## 2023-10-26 NOTE — Progress Notes (Signed)
  Received patient in bed to unit.   Informed consent signed and in chart.    TX duration: 3.5hrs     Transported back to floor  Hand-off given to patient's nurse. No distress noted and no c/o   Access used: LAVF Access issues: none   Total UF removed: 2.0L Medication(s) given: tylenol Post HD VS: 145/63 Post HD weight: 44.9kg     Lynann Beaver  [facility]

## 2023-10-26 NOTE — Plan of Care (Signed)

## 2023-10-26 NOTE — Care Management Important Message (Signed)
Important Message  Patient Details  Name: Kristen Francis MRN: [id number] Date of Birth: Jul 06, 1946   Important Message Given:  Yes - Medicare IM     [name] [name] 10/26/2023, 2:01 PM

## 2023-10-28 DIAGNOSIS — D509 Iron deficiency anemia, unspecified: Secondary | ICD-10-CM | POA: Diagnosis not present

## 2023-10-28 DIAGNOSIS — N2581 Secondary hyperparathyroidism of renal origin: Secondary | ICD-10-CM | POA: Diagnosis not present

## 2023-10-28 DIAGNOSIS — R52 Pain, unspecified: Secondary | ICD-10-CM | POA: Diagnosis not present

## 2023-10-28 DIAGNOSIS — Z992 Dependence on renal dialysis: Secondary | ICD-10-CM | POA: Diagnosis not present

## 2023-10-28 DIAGNOSIS — D689 Coagulation defect, unspecified: Secondary | ICD-10-CM | POA: Diagnosis not present

## 2023-10-28 DIAGNOSIS — N186 End stage renal disease: Secondary | ICD-10-CM | POA: Diagnosis not present

## 2023-10-28 DIAGNOSIS — D631 Anemia in chronic kidney disease: Secondary | ICD-10-CM | POA: Diagnosis not present

## 2023-10-29 ENCOUNTER — Telehealth: Payer: Self-pay | Admitting: *Deleted

## 2023-10-29 DIAGNOSIS — Z992 Dependence on renal dialysis: Secondary | ICD-10-CM | POA: Diagnosis not present

## 2023-10-29 DIAGNOSIS — N186 End stage renal disease: Secondary | ICD-10-CM | POA: Diagnosis not present

## 2023-10-29 DIAGNOSIS — I3139 Other pericardial effusion (noninflammatory): Secondary | ICD-10-CM | POA: Diagnosis not present

## 2023-10-29 DIAGNOSIS — I340 Nonrheumatic mitral (valve) insufficiency: Secondary | ICD-10-CM | POA: Diagnosis not present

## 2023-10-29 DIAGNOSIS — Z23 Encounter for immunization: Secondary | ICD-10-CM | POA: Diagnosis not present

## 2023-10-29 DIAGNOSIS — I5020 Unspecified systolic (congestive) heart failure: Secondary | ICD-10-CM | POA: Diagnosis not present

## 2023-10-29 DIAGNOSIS — I351 Nonrheumatic aortic (valve) insufficiency: Secondary | ICD-10-CM | POA: Diagnosis not present

## 2023-10-29 DIAGNOSIS — I10 Essential (primary) hypertension: Secondary | ICD-10-CM | POA: Diagnosis not present

## 2023-10-29 DIAGNOSIS — I272 Pulmonary hypertension, unspecified: Secondary | ICD-10-CM | POA: Diagnosis not present

## 2023-10-29 LAB — LIPOPROTEIN A (LPA): Lipoprotein (a): 282.5 nmol/L — ABNORMAL HIGH (ref <75.0)

## 2023-10-29 NOTE — Transitions of Care (Post Inpatient/ED Visit) (Signed)
   10/29/2023  Name: Kristen Francis MRN: [id number] DOB: 1946-09-15  Today's TOC FU Call Status: Today's TOC FU Call Status:: Unsuccessful Call (1st Attempt) Unsuccessful Call (1st Attempt) Date: 10/29/23  Attempted to reach the patient regarding the most recent Inpatient/ED visit.  Follow Up Plan: Additional outreach attempts will be made to reach the patient to complete the Transitions of Care (Post Inpatient/ED visit) call.   Gean Maidens BSN RN Population Health- Transition of Care Team.  [facility] [phone]

## 2023-10-29 NOTE — Transitions of Care (Post Inpatient/ED Visit) (Signed)
   10/29/2023  Name: Kristen Francis MRN: [id number] DOB: 09-30-1946  Today's TOC FU Call Status: Today's TOC FU Call Status:: Unsuccessful Call (2nd Attempt) Unsuccessful Call (2nd Attempt) Date: 10/29/23  Attempted to reach the patient regarding the most recent Inpatient/ED visit.  Follow Up Plan: No further outreach attempts will be made at this time. We have been unable to contact the patient.  Gean Maidens BSN RN Population Health- Transition of Care Team.  [facility] [phone]

## 2023-10-30 ENCOUNTER — Telehealth: Payer: Self-pay | Admitting: Nurse Practitioner

## 2023-10-30 DIAGNOSIS — R52 Pain, unspecified: Secondary | ICD-10-CM | POA: Diagnosis not present

## 2023-10-30 DIAGNOSIS — N2581 Secondary hyperparathyroidism of renal origin: Secondary | ICD-10-CM | POA: Diagnosis not present

## 2023-10-30 DIAGNOSIS — Z992 Dependence on renal dialysis: Secondary | ICD-10-CM | POA: Diagnosis not present

## 2023-10-30 DIAGNOSIS — D631 Anemia in chronic kidney disease: Secondary | ICD-10-CM | POA: Diagnosis not present

## 2023-10-30 DIAGNOSIS — D689 Coagulation defect, unspecified: Secondary | ICD-10-CM | POA: Diagnosis not present

## 2023-10-30 DIAGNOSIS — D509 Iron deficiency anemia, unspecified: Secondary | ICD-10-CM | POA: Diagnosis not present

## 2023-10-30 DIAGNOSIS — N186 End stage renal disease: Secondary | ICD-10-CM | POA: Diagnosis not present

## 2023-10-30 NOTE — Telephone Encounter (Signed)
Called patient to schedule hospital f/u. She stated she did not need an appointment. Will call if she needs us-Toni

## 2023-11-02 DIAGNOSIS — D509 Iron deficiency anemia, unspecified: Secondary | ICD-10-CM | POA: Diagnosis not present

## 2023-11-02 DIAGNOSIS — D631 Anemia in chronic kidney disease: Secondary | ICD-10-CM | POA: Diagnosis not present

## 2023-11-02 DIAGNOSIS — N2581 Secondary hyperparathyroidism of renal origin: Secondary | ICD-10-CM | POA: Diagnosis not present

## 2023-11-02 DIAGNOSIS — R52 Pain, unspecified: Secondary | ICD-10-CM | POA: Diagnosis not present

## 2023-11-02 DIAGNOSIS — D689 Coagulation defect, unspecified: Secondary | ICD-10-CM | POA: Diagnosis not present

## 2023-11-02 DIAGNOSIS — Z992 Dependence on renal dialysis: Secondary | ICD-10-CM | POA: Diagnosis not present

## 2023-11-02 DIAGNOSIS — N186 End stage renal disease: Secondary | ICD-10-CM | POA: Diagnosis not present

## 2023-11-03 DIAGNOSIS — Z992 Dependence on renal dialysis: Secondary | ICD-10-CM | POA: Diagnosis not present

## 2023-11-03 DIAGNOSIS — N040 Nephrotic syndrome with minor glomerular abnormality: Secondary | ICD-10-CM | POA: Diagnosis not present

## 2023-11-03 DIAGNOSIS — N186 End stage renal disease: Secondary | ICD-10-CM | POA: Diagnosis not present

## 2023-11-05 DIAGNOSIS — D509 Iron deficiency anemia, unspecified: Secondary | ICD-10-CM | POA: Diagnosis not present

## 2023-11-05 DIAGNOSIS — N2581 Secondary hyperparathyroidism of renal origin: Secondary | ICD-10-CM | POA: Diagnosis not present

## 2023-11-05 DIAGNOSIS — N186 End stage renal disease: Secondary | ICD-10-CM | POA: Diagnosis not present

## 2023-11-05 DIAGNOSIS — I5020 Unspecified systolic (congestive) heart failure: Secondary | ICD-10-CM | POA: Diagnosis not present

## 2023-11-05 DIAGNOSIS — R52 Pain, unspecified: Secondary | ICD-10-CM | POA: Diagnosis not present

## 2023-11-05 DIAGNOSIS — D631 Anemia in chronic kidney disease: Secondary | ICD-10-CM | POA: Diagnosis not present

## 2023-11-05 DIAGNOSIS — Z992 Dependence on renal dialysis: Secondary | ICD-10-CM | POA: Diagnosis not present

## 2023-11-05 DIAGNOSIS — D689 Coagulation defect, unspecified: Secondary | ICD-10-CM | POA: Diagnosis not present

## 2023-11-06 ENCOUNTER — Ambulatory Visit (INDEPENDENT_AMBULATORY_CARE_PROVIDER_SITE_OTHER): Payer: Medicare Other

## 2023-11-06 ENCOUNTER — Ambulatory Visit (INDEPENDENT_AMBULATORY_CARE_PROVIDER_SITE_OTHER): Payer: Medicare Other | Admitting: Nurse Practitioner

## 2023-11-06 ENCOUNTER — Encounter (INDEPENDENT_AMBULATORY_CARE_PROVIDER_SITE_OTHER): Payer: Self-pay | Admitting: Nurse Practitioner

## 2023-11-06 VITALS — BP 183/70 | HR 74 | Resp 18 | Ht 63.0 in | Wt 112.4 lb

## 2023-11-06 DIAGNOSIS — E1129 Type 2 diabetes mellitus with other diabetic kidney complication: Secondary | ICD-10-CM | POA: Diagnosis not present

## 2023-11-06 DIAGNOSIS — N186 End stage renal disease: Secondary | ICD-10-CM

## 2023-11-06 DIAGNOSIS — I6522 Occlusion and stenosis of left carotid artery: Secondary | ICD-10-CM

## 2023-11-06 DIAGNOSIS — I10 Essential (primary) hypertension: Secondary | ICD-10-CM

## 2023-11-06 NOTE — Progress Notes (Signed)
Subjective:    Patient ID: Alessandra Bevels, female    DOB: 07/11/1946, 77 y.o.   MRN: [id number] Chief Complaint  Patient presents with   Follow-up    Follow up with Schnier, Latina Craver, MD (Vascular Surgery) in 3 weeks (10/30/2023); follow up after procedure with HDA    The patient returns to the office for followup status post intervention of their dialysis access left brachial axillary AV fistual .   Following the intervention the access function has significantly improved, with better flow rates and improved KT/V. The patient has not been experiencing increased bleeding times following decannulation and the patient denies increased recirculation. The patient denies an increase in arm swelling. At the present time the patient denies hand pain.  No recent shortening of the patient's walking distance or new symptoms consistent with claudication.  No history of rest pain symptoms. No new ulcers or wounds of the lower extremities have occurred.  The patient denies amaurosis fugax or recent TIA symptoms. There are no recent neurological changes noted. There is no history of DVT, PE or superficial thrombophlebitis. No recent episodes of angina or shortness of breath documented.   Duplex ultrasound of the AV access shows a patent access.  The previously noted stenosis is improved compared to last study.  Flow volume today is 2188 cc/min (previous flow volume was 873 cc/min) The patient also underwent a carotid artery duplex.  The patient has 1 to 39% carotid artery stenosis bilaterally.  Antegrade flow bilaterally with normal flow hemodynamics in the bilateral subclavian arteries.      Review of Systems  Hematological:  Does not bruise/bleed easily.  All other systems reviewed and are negative.      Objective:   Physical Exam Vitals reviewed.  HENT:     Head: Normocephalic.  Cardiovascular:     Rate and Rhythm: Normal rate.     Pulses: Normal pulses.     Arteriovenous access:  Left arteriovenous access is present.    Comments: Good thrill and bruit Pulmonary:     Effort: Pulmonary effort is normal.  Skin:    General: Skin is warm and dry.  Neurological:     Mental Status: She is alert and oriented to person, place, and time.  Psychiatric:        Mood and Affect: Mood normal.        Behavior: Behavior normal.        Thought Content: Thought content normal.        Judgment: Judgment normal.     BP (!) 183/70 (BP Location: Right Arm)   Pulse 74   Resp 18   Ht 5\' 3"  (1.6 m)   Wt 112 lb 6.4 oz (51 kg)   BMI 19.91 kg/m   Past Medical History:  Diagnosis Date   Anemia    Arthritis    Chronic kidney disease    ESRD   Complication of anesthesia    had procedure and felt incision early 80s   Gout    Hyperlipemia    Hypertension    Macular degeneration, right eye    Stroke [facility]     Social History   Socioeconomic History   Marital status: Married    Spouse name: Not on file   Number of children: Not on file   Years of education: Not on file   Highest education level: Not on file  Occupational History   Not on file  Tobacco Use   Smoking status: Former  Smokeless tobacco: Never  Vaping Use   Vaping status: Never Used  Substance and Sexual Activity   Alcohol use: No   Drug use: No   Sexual activity: Not on file  Other Topics Concern   Not on file  Social History Narrative   Lives in  [place]; used to clean; no smoking; no alcohol.    Social Determinants of Health   Financial Resource Strain: Not on file  Food Insecurity: No Food Insecurity (10/22/2023)   Hunger Vital Sign    Worried About Running Out of Food in the Last Year: Never true    Ran Out of Food in the Last Year: Never true  Transportation Needs: No Transportation Needs (10/22/2023)   PRAPARE - Administrator, Civil Service (Medical): No    Lack of Transportation (Non-Medical): No  Physical Activity: Not on file  Stress: Not on file  Social  Connections: Not on file  Intimate Partner Violence: Not At Risk (10/22/2023)   Humiliation, Afraid, Rape, and Kick questionnaire    Fear of Current or Ex-Partner: No    Emotionally Abused: No    Physically Abused: No    Sexually Abused: No    Past Surgical History:  Procedure Laterality Date   A/V FISTULAGRAM Left 12/25/2018   Procedure: A/V FISTULAGRAM;  Surgeon: Renford Dills, MD;  Location: [facility] INVASIVE CV LAB;  Service: Cardiovascular;  Laterality: Left;   A/V FISTULAGRAM Left 10/09/2023   Procedure: A/V Fistulagram;  Surgeon: Renford Dills, MD;  Location: [facility] INVASIVE CV LAB;  Service: Cardiovascular;  Laterality: Left;   ABDOMINAL HYSTERECTOMY     APPENDECTOMY     AV FISTULA PLACEMENT Left 07/12/2018   Procedure: INSERTION OF ARTERIOVENOUS (AV) GORE-TEX GRAFT ARM ( BRACHIAL AXILLARY );  Surgeon: Renford Dills, MD;  Location: [facility] ORS;  Service: Vascular;  Laterality: Left;   COLONOSCOPY WITH PROPOFOL N/A 01/14/2016   Procedure: COLONOSCOPY WITH PROPOFOL;  Surgeon: Scot Jun, MD;  Location: [facility] ENDOSCOPY;  Service: Endoscopy;  Laterality: N/A;   COLONOSCOPY WITH PROPOFOL N/A 02/14/2022   Procedure: COLONOSCOPY WITH PROPOFOL;  Surgeon: Regis Bill, MD;  Location: [facility] ENDOSCOPY;  Service: Endoscopy;  Laterality: N/A;   RIGHT/LEFT HEART CATH AND CORONARY ANGIOGRAPHY N/A 10/25/2023   Procedure: RIGHT/LEFT HEART CATH AND CORONARY ANGIOGRAPHY;  Surgeon: Marcina Millard, MD;  Location: [facility] INVASIVE CV LAB;  Service: Cardiovascular;  Laterality: N/A;   TONSILLECTOMY      Family History  Problem Relation Age of Onset   Kidney disease Mother    Breast cancer Neg Hx     Allergies  Allergen Reactions   Other Other (See Comments)    Pt does not receive Blood    Penicillins Hives    Has patient had a PCN reaction causing immediate rash, facial/tongue/throat swelling, SOB or lightheadedness with hypotension: YES Has patient had a PCN reaction causing  severe rash involving mucus membranes or skin necrosis: no Has patient had a PCN reaction that required hospitalization: no Has patient had a PCN reaction occurring within the last 10 years: no If all of the above answers are "NO", then may proceed with Cephalosporin use.    Sodium Bicarbonate Itching       Latest Ref Rng & Units 10/26/2023    4:24 AM 10/25/2023   10:18 AM 10/25/2023   10:10 AM  CBC  WBC 4.0 - 10.5 K/uL 4.9     Hemoglobin 12.0 - 15.0 g/dL 9.2  8.8  9.2  Hematocrit 36.0 - 46.0 % 28.3  26.0  27.0   Platelets 150 - 400 K/uL 188         CMP     Component Value Date/Time   NA 134 (L) 10/26/2023 0424   NA 139 07/02/2020 1105   K 4.7 10/26/2023 0424   CL 96 (L) 10/26/2023 0424   CO2 26 10/26/2023 0424   GLUCOSE 79 10/26/2023 0424   BUN 59 (H) 10/26/2023 0424   BUN 13 07/02/2020 1105   CREATININE 6.50 (H) 10/26/2023 0424   CALCIUM 8.5 (L) 10/26/2023 0424   PROT 5.9 (L) 10/23/2023 1953   PROT 6.6 07/02/2020 1105   ALBUMIN 2.9 (L) 10/24/2023 1350   ALBUMIN 4.0 07/02/2020 1105   AST 13 (L) 10/23/2023 1953   ALT 8 10/23/2023 1953   ALKPHOS 32 (L) 10/23/2023 1953   BILITOT 0.7 10/23/2023 1953   BILITOT 0.3 07/02/2020 1105   GFRNONAA 6 (L) 10/26/2023 0424     No results found.     Assessment & Plan:   1. End stage renal disease (HCC) Recommend:  The patient is doing well and currently has adequate dialysis access. The patient's dialysis center is not reporting any access issues. Flow pattern is stable when compared to the prior ultrasound.  The patient should have a duplex ultrasound of the dialysis access in 6 months. The patient will follow-up with me in the office after each ultrasound    2. Left carotid stenosis Recommend:  Given the patient's asymptomatic subcritical stenosis no further invasive testing or surgery at this time.  Duplex ultrasound shows 1-39% stenosis bilaterally.  Continue antiplatelet therapy as prescribed Continue  management of CAD, HTN and Hyperlipidemia Healthy heart diet,  encouraged exercise at least 4 times per week  Follow up in 12 months with duplex ultrasound and physical exam   3. Essential hypertension Continue antihypertensive medications as already ordered, these medications have been reviewed and there are no changes at this time.  4. Type 2 diabetes mellitus with other diabetic kidney complication (HCC) Continue hypoglycemic medications as already ordered, these medications have been reviewed and there are no changes at this time.  Hgb A1C to be monitored as already arranged by primary service   Current Outpatient Medications on File Prior to Visit  Medication Sig Dispense Refill   aspirin EC 81 MG EC tablet Take 1 tablet (81 mg total) by mouth daily. Swallow whole. 30 tablet 0   B Complex-C-Folic Acid (RENAL-VITE) 0.8 MG TABS Take 0.8 mg by mouth every evening. On dialysis days     benzonatate (TESSALON) 100 MG capsule Take 1 capsule (100 mg total) by mouth 2 (two) times daily as needed for cough. 60 capsule 3   carvedilol (COREG) 25 MG tablet TAKE 1 TABLET(25 MG) BY MOUTH TWICE DAILY WITH A MEAL 180 tablet 3   cholecalciferol (VITAMIN D) 1000 units tablet Take 1,000 Units by mouth daily.      cinacalcet (SENSIPAR) 60 MG tablet Take 120 mg by mouth daily with supper.     dronabinol (MARINOL) 5 MG capsule Take 1 capsule by mouth before meals twice daily on non-dialysis days 40 capsule 2   ferrous sulfate 325 (65 FE) MG tablet Take 1 tablet (325 mg total) by mouth 2 (two) times daily with a meal. 60 tablet 2   furosemide (LASIX) 80 MG tablet Take 1 tablet (80 mg total) by mouth 4 (four) times a week. On non-dialysis days 16 tablet 2   hydrALAZINE (APRESOLINE)  50 MG tablet TAKE 1 TABLET(50 MG) BY MOUTH THREE TIMES DAILY. 90 tablet 2   lidocaine-prilocaine (EMLA) cream Apply 1 Application topically as directed.  6   losartan (COZAAR) 100 MG tablet Take 1 tablet (100 mg total) by mouth  daily. Increased from 50 mg. 30 tablet 2   omeprazole (PRILOSEC) 40 MG capsule TAKE 1 CAPSULE(40 MG) BY MOUTH DAILY 90 capsule 3   rosuvastatin (CRESTOR) 5 MG tablet Take 5 mg by mouth See admin instructions. Take 1 tablet (5mg ) by mouth on non-dialysis days     sacubitril-valsartan (ENTRESTO) 49-51 MG Take 1 tablet by mouth every 12 (twelve) hours.     sennosides-docusate sodium (SENOKOT-S) 8.6-50 MG tablet Take 2 tablets by mouth daily.     sevelamer carbonate (RENVELA) 800 MG tablet Take 1,600 mg by mouth 3 (three) times daily with meals.     No current facility-administered medications on file prior to visit.    There are no Patient Instructions on file for this visit. No follow-ups on file.   Georgiana Spinner, NP

## 2023-11-07 DIAGNOSIS — R52 Pain, unspecified: Secondary | ICD-10-CM | POA: Diagnosis not present

## 2023-11-07 DIAGNOSIS — D509 Iron deficiency anemia, unspecified: Secondary | ICD-10-CM | POA: Diagnosis not present

## 2023-11-07 DIAGNOSIS — D689 Coagulation defect, unspecified: Secondary | ICD-10-CM | POA: Diagnosis not present

## 2023-11-07 DIAGNOSIS — N186 End stage renal disease: Secondary | ICD-10-CM | POA: Diagnosis not present

## 2023-11-07 DIAGNOSIS — Z992 Dependence on renal dialysis: Secondary | ICD-10-CM | POA: Diagnosis not present

## 2023-11-07 DIAGNOSIS — N2581 Secondary hyperparathyroidism of renal origin: Secondary | ICD-10-CM | POA: Diagnosis not present

## 2023-11-07 DIAGNOSIS — D631 Anemia in chronic kidney disease: Secondary | ICD-10-CM | POA: Diagnosis not present

## 2023-11-08 DIAGNOSIS — D509 Iron deficiency anemia, unspecified: Secondary | ICD-10-CM | POA: Diagnosis not present

## 2023-11-08 DIAGNOSIS — D689 Coagulation defect, unspecified: Secondary | ICD-10-CM | POA: Diagnosis not present

## 2023-11-08 DIAGNOSIS — N186 End stage renal disease: Secondary | ICD-10-CM | POA: Diagnosis not present

## 2023-11-08 DIAGNOSIS — Z992 Dependence on renal dialysis: Secondary | ICD-10-CM | POA: Diagnosis not present

## 2023-11-08 DIAGNOSIS — N2581 Secondary hyperparathyroidism of renal origin: Secondary | ICD-10-CM | POA: Diagnosis not present

## 2023-11-08 DIAGNOSIS — R52 Pain, unspecified: Secondary | ICD-10-CM | POA: Diagnosis not present

## 2023-11-08 DIAGNOSIS — D631 Anemia in chronic kidney disease: Secondary | ICD-10-CM | POA: Diagnosis not present

## 2023-11-09 DIAGNOSIS — D689 Coagulation defect, unspecified: Secondary | ICD-10-CM | POA: Diagnosis not present

## 2023-11-09 DIAGNOSIS — R52 Pain, unspecified: Secondary | ICD-10-CM | POA: Diagnosis not present

## 2023-11-09 DIAGNOSIS — N2581 Secondary hyperparathyroidism of renal origin: Secondary | ICD-10-CM | POA: Diagnosis not present

## 2023-11-09 DIAGNOSIS — D509 Iron deficiency anemia, unspecified: Secondary | ICD-10-CM | POA: Diagnosis not present

## 2023-11-09 DIAGNOSIS — N186 End stage renal disease: Secondary | ICD-10-CM | POA: Diagnosis not present

## 2023-11-09 DIAGNOSIS — Z992 Dependence on renal dialysis: Secondary | ICD-10-CM | POA: Diagnosis not present

## 2023-11-09 DIAGNOSIS — D631 Anemia in chronic kidney disease: Secondary | ICD-10-CM | POA: Diagnosis not present

## 2023-11-12 ENCOUNTER — Inpatient Hospital Stay
Admission: EM | Admit: 2023-11-12 | Discharge: 2023-11-13 | DRG: 291 | Disposition: A | Discharge status: Home / Self Care | Payer: Medicare Other | Attending: Internal Medicine | Admitting: Internal Medicine

## 2023-11-12 ENCOUNTER — Emergency Department: Payer: Medicare Other

## 2023-11-12 ENCOUNTER — Other Ambulatory Visit: Payer: Self-pay

## 2023-11-12 DIAGNOSIS — R6889 Other general symptoms and signs: Secondary | ICD-10-CM | POA: Diagnosis not present

## 2023-11-12 DIAGNOSIS — E785 Hyperlipidemia, unspecified: Secondary | ICD-10-CM | POA: Diagnosis not present

## 2023-11-12 DIAGNOSIS — Z90710 Acquired absence of both cervix and uterus: Secondary | ICD-10-CM | POA: Diagnosis not present

## 2023-11-12 DIAGNOSIS — I110 Hypertensive heart disease with heart failure: Secondary | ICD-10-CM | POA: Diagnosis not present

## 2023-11-12 DIAGNOSIS — I509 Heart failure, unspecified: Secondary | ICD-10-CM

## 2023-11-12 DIAGNOSIS — I5023 Acute on chronic systolic (congestive) heart failure: Secondary | ICD-10-CM | POA: Diagnosis not present

## 2023-11-12 DIAGNOSIS — Z880 Allergy status to penicillin: Secondary | ICD-10-CM | POA: Diagnosis not present

## 2023-11-12 DIAGNOSIS — G459 Transient cerebral ischemic attack, unspecified: Secondary | ICD-10-CM | POA: Diagnosis present

## 2023-11-12 DIAGNOSIS — I10 Essential (primary) hypertension: Secondary | ICD-10-CM | POA: Diagnosis present

## 2023-11-12 DIAGNOSIS — N2581 Secondary hyperparathyroidism of renal origin: Secondary | ICD-10-CM | POA: Diagnosis present

## 2023-11-12 DIAGNOSIS — N186 End stage renal disease: Secondary | ICD-10-CM | POA: Diagnosis not present

## 2023-11-12 DIAGNOSIS — Z841 Family history of disorders of kidney and ureter: Secondary | ICD-10-CM

## 2023-11-12 DIAGNOSIS — J9601 Acute respiratory failure with hypoxia: Secondary | ICD-10-CM | POA: Diagnosis not present

## 2023-11-12 DIAGNOSIS — R7989 Other specified abnormal findings of blood chemistry: Secondary | ICD-10-CM | POA: Diagnosis not present

## 2023-11-12 DIAGNOSIS — I132 Hypertensive heart and chronic kidney disease with heart failure and with stage 5 chronic kidney disease, or end stage renal disease: Secondary | ICD-10-CM | POA: Diagnosis not present

## 2023-11-12 DIAGNOSIS — J9600 Acute respiratory failure, unspecified whether with hypoxia or hypercapnia: Secondary | ICD-10-CM | POA: Diagnosis not present

## 2023-11-12 DIAGNOSIS — Z1152 Encounter for screening for COVID-19: Secondary | ICD-10-CM | POA: Diagnosis not present

## 2023-11-12 DIAGNOSIS — Z79899 Other long term (current) drug therapy: Secondary | ICD-10-CM | POA: Diagnosis not present

## 2023-11-12 DIAGNOSIS — D631 Anemia in chronic kidney disease: Secondary | ICD-10-CM | POA: Diagnosis not present

## 2023-11-12 DIAGNOSIS — I5033 Acute on chronic diastolic (congestive) heart failure: Secondary | ICD-10-CM

## 2023-11-12 DIAGNOSIS — Z7982 Long term (current) use of aspirin: Secondary | ICD-10-CM | POA: Diagnosis not present

## 2023-11-12 DIAGNOSIS — Z87891 Personal history of nicotine dependence: Secondary | ICD-10-CM | POA: Diagnosis not present

## 2023-11-12 DIAGNOSIS — Z888 Allergy status to other drugs, medicaments and biological substances status: Secondary | ICD-10-CM

## 2023-11-12 DIAGNOSIS — E1169 Type 2 diabetes mellitus with other specified complication: Secondary | ICD-10-CM | POA: Diagnosis not present

## 2023-11-12 DIAGNOSIS — Z743 Need for continuous supervision: Secondary | ICD-10-CM | POA: Diagnosis not present

## 2023-11-12 DIAGNOSIS — I5043 Acute on chronic combined systolic (congestive) and diastolic (congestive) heart failure: Secondary | ICD-10-CM | POA: Diagnosis present

## 2023-11-12 DIAGNOSIS — I272 Pulmonary hypertension, unspecified: Secondary | ICD-10-CM | POA: Diagnosis not present

## 2023-11-12 DIAGNOSIS — E8770 Fluid overload, unspecified: Secondary | ICD-10-CM | POA: Diagnosis not present

## 2023-11-12 DIAGNOSIS — Z66 Do not resuscitate: Secondary | ICD-10-CM | POA: Diagnosis present

## 2023-11-12 DIAGNOSIS — I2510 Atherosclerotic heart disease of native coronary artery without angina pectoris: Secondary | ICD-10-CM | POA: Diagnosis present

## 2023-11-12 DIAGNOSIS — Z8673 Personal history of transient ischemic attack (TIA), and cerebral infarction without residual deficits: Secondary | ICD-10-CM | POA: Diagnosis not present

## 2023-11-12 DIAGNOSIS — I700 Atherosclerosis of aorta: Secondary | ICD-10-CM | POA: Diagnosis not present

## 2023-11-12 DIAGNOSIS — Z992 Dependence on renal dialysis: Secondary | ICD-10-CM

## 2023-11-12 DIAGNOSIS — E1122 Type 2 diabetes mellitus with diabetic chronic kidney disease: Secondary | ICD-10-CM | POA: Diagnosis present

## 2023-11-12 DIAGNOSIS — I517 Cardiomegaly: Secondary | ICD-10-CM | POA: Diagnosis not present

## 2023-11-12 DIAGNOSIS — E1129 Type 2 diabetes mellitus with other diabetic kidney complication: Secondary | ICD-10-CM | POA: Diagnosis present

## 2023-11-12 DIAGNOSIS — R0902 Hypoxemia: Secondary | ICD-10-CM | POA: Diagnosis not present

## 2023-11-12 DIAGNOSIS — R069 Unspecified abnormalities of breathing: Secondary | ICD-10-CM | POA: Diagnosis not present

## 2023-11-12 LAB — CBC WITH DIFFERENTIAL/PLATELET
Abs Immature Granulocytes: 0.03 10*3/uL (ref 0.00–0.07)
Basophils Absolute: 0.1 10*3/uL (ref 0.0–0.1)
Basophils Relative: 1 %
Eosinophils Absolute: 0.1 10*3/uL (ref 0.0–0.5)
Eosinophils Relative: 2 %
HCT: 34.7 % — ABNORMAL LOW (ref 36.0–46.0)
Hemoglobin: 10.7 g/dL — ABNORMAL LOW (ref 12.0–15.0)
Immature Granulocytes: 0 %
Lymphocytes Relative: 8 %
Lymphs Abs: 0.6 10*3/uL — ABNORMAL LOW (ref 0.7–4.0)
MCH: 27.9 pg (ref 26.0–34.0)
MCHC: 30.8 g/dL (ref 30.0–36.0)
MCV: 90.4 fL (ref 80.0–100.0)
Monocytes Absolute: 0.2 10*3/uL (ref 0.1–1.0)
Monocytes Relative: 3 %
Neutro Abs: 6.3 10*3/uL (ref 1.7–7.7)
Neutrophils Relative %: 86 %
Platelets: 207 10*3/uL (ref 150–400)
RBC: 3.84 MIL/uL — ABNORMAL LOW (ref 3.87–5.11)
RDW: 18.2 % — ABNORMAL HIGH (ref 11.5–15.5)
WBC: 7.3 10*3/uL (ref 4.0–10.5)
nRBC: 0 % (ref 0.0–0.2)

## 2023-11-12 LAB — BLOOD GAS, VENOUS
Acid-Base Excess: 4 mmol/L — ABNORMAL HIGH (ref 0.0–2.0)
Acid-Base Excess: 2.2 mmol/L — ABNORMAL HIGH (ref 0.0–2.0)
Bicarbonate: 31.9 mmol/L — ABNORMAL HIGH (ref 20.0–28.0)
Bicarbonate: 27.3 mmol/L (ref 20.0–28.0)
O2 Saturation: 61.5 %
O2 Saturation: 84.1 %
Patient temperature: 37
Patient temperature: 37
pCO2, Ven: 62 mm[Hg] — ABNORMAL HIGH (ref 44–60)
pCO2, Ven: 43 mm[Hg] — ABNORMAL LOW (ref 44–60)
pH, Ven: 7.32 (ref 7.25–7.43)
pH, Ven: 7.41 (ref 7.25–7.43)
pO2, Ven: 43 mm[Hg] (ref 32–45)
pO2, Ven: 56 mm[Hg] — ABNORMAL HIGH (ref 32–45)

## 2023-11-12 LAB — BASIC METABOLIC PANEL
Anion gap: 14 (ref 5–15)
BUN: 69 mg/dL — ABNORMAL HIGH (ref 8–23)
CO2: 28 mmol/L (ref 22–32)
Calcium: 9 mg/dL (ref 8.9–10.3)
Chloride: 100 mmol/L (ref 98–111)
Creatinine, Ser: 7.87 mg/dL — ABNORMAL HIGH (ref 0.44–1.00)
GFR, Estimated: 5 mL/min — ABNORMAL LOW (ref >60)
Glucose, Bld: 157 mg/dL — ABNORMAL HIGH (ref 70–99)
Potassium: 5 mmol/L (ref 3.5–5.1)
Sodium: 142 mmol/L (ref 135–145)

## 2023-11-12 LAB — CBG MONITORING, ED
Glucose-Capillary: 89 mg/dL (ref 70–99)
Glucose-Capillary: 76 mg/dL (ref 70–99)

## 2023-11-12 LAB — TROPONIN I (HIGH SENSITIVITY)
Troponin I (High Sensitivity): 32 ng/L — ABNORMAL HIGH (ref <18)
Troponin I (High Sensitivity): 65 ng/L — ABNORMAL HIGH (ref <18)

## 2023-11-12 LAB — RESP PANEL BY RT-PCR (RSV, FLU A&B, COVID)  RVPGX2
Influenza A by PCR: NEGATIVE
Influenza B by PCR: NEGATIVE
Resp Syncytial Virus by PCR: NEGATIVE
SARS Coronavirus 2 by RT PCR: NEGATIVE

## 2023-11-12 LAB — BRAIN NATRIURETIC PEPTIDE: B Natriuretic Peptide: >4500 pg/mL — ABNORMAL HIGH (ref 0.0–100.0)

## 2023-11-12 MED ORDER — ONDANSETRON HCL 4 MG/2ML IJ SOLN: 4.0000 mg | Freq: Four times a day (QID) | INTRAMUSCULAR | Status: DC | PRN

## 2023-11-12 MED ORDER — FUROSEMIDE 10 MG/ML IJ SOLN
40.0000 mg | Freq: Once | INTRAMUSCULAR | Status: AC
  Administered 2023-11-12: 40 mg via INTRAVENOUS
  Filled 2023-11-12: qty 4

## 2023-11-12 MED ORDER — ROSUVASTATIN CALCIUM 5 MG PO TABS: 5.0000 mg | ORAL_TABLET | ORAL | Status: DC

## 2023-11-12 MED ORDER — FUROSEMIDE 40 MG PO TABS
80.0000 mg | ORAL_TABLET | ORAL | Status: DC
  Administered 2023-11-13: 80 mg via ORAL
  Filled 2023-11-12: qty 2

## 2023-11-12 MED ORDER — HYDRALAZINE HCL 20 MG/ML IJ SOLN
10.0000 mg | INTRAMUSCULAR | Status: DC | PRN
  Filled 2023-11-12: qty 1

## 2023-11-12 MED ORDER — HYDRALAZINE HCL 20 MG/ML IJ SOLN: 5.0000 mg | Freq: Once | INTRAMUSCULAR | Status: DC

## 2023-11-12 MED ORDER — ASPIRIN 81 MG PO TBEC
81.0000 mg | DELAYED_RELEASE_TABLET | Freq: Every day | ORAL | Status: DC
  Administered 2023-11-13: 81 mg via ORAL
  Filled 2023-11-12: qty 1

## 2023-11-12 MED ORDER — LIDOCAINE-PRILOCAINE 2.5-2.5 % EX CREA: 1.0000 | TOPICAL_CREAM | CUTANEOUS | Status: DC | PRN

## 2023-11-12 MED ORDER — HEPARIN SODIUM (PORCINE) 5000 UNIT/ML IJ SOLN
5000.0000 [IU] | Freq: Three times a day (TID) | INTRAMUSCULAR | Status: DC
  Administered 2023-11-12 – 2023-11-13 (×4): 5000 [IU] via SUBCUTANEOUS
  Filled 2023-11-12 (×4): qty 1

## 2023-11-12 MED ORDER — ROSUVASTATIN CALCIUM 5 MG PO TABS
5.0000 mg | ORAL_TABLET | ORAL | Status: DC
  Administered 2023-11-13: 5 mg via ORAL
  Filled 2023-11-12: qty 1

## 2023-11-12 MED ORDER — PANTOPRAZOLE SODIUM 40 MG PO TBEC
40.0000 mg | DELAYED_RELEASE_TABLET | Freq: Every day | ORAL | Status: DC
  Administered 2023-11-13: 40 mg via ORAL
  Filled 2023-11-12: qty 1

## 2023-11-12 MED ORDER — SEVELAMER CARBONATE 800 MG PO TABS
1600.0000 mg | ORAL_TABLET | Freq: Three times a day (TID) | ORAL | Status: DC
  Administered 2023-11-13 (×3): 1600 mg via ORAL
  Filled 2023-11-12 (×5): qty 2

## 2023-11-12 MED ORDER — INSULIN ASPART 100 UNIT/ML IJ SOLN: 0.0000 [IU] | Freq: Three times a day (TID) | INTRAMUSCULAR | Status: DC

## 2023-11-12 MED ORDER — SACUBITRIL-VALSARTAN 49-51 MG PO TABS
1.0000 | ORAL_TABLET | Freq: Two times a day (BID) | ORAL | Status: DC
Start: 2023-11-12 — End: 2023-11-13
  Administered 2023-11-12 – 2023-11-13 (×2): 1 via ORAL
  Filled 2023-11-12 (×3): qty 1

## 2023-11-12 MED ORDER — ONDANSETRON HCL 4 MG PO TABS: 4.0000 mg | ORAL_TABLET | Freq: Four times a day (QID) | ORAL | Status: DC | PRN

## 2023-11-12 MED ORDER — SENNOSIDES-DOCUSATE SODIUM 8.6-50 MG PO TABS
2.0000 | ORAL_TABLET | Freq: Every day | ORAL | Status: DC
  Administered 2023-11-13: 2 via ORAL
  Filled 2023-11-12: qty 2

## 2023-11-12 MED ORDER — CINACALCET HCL 30 MG PO TABS
120.0000 mg | ORAL_TABLET | Freq: Every day | ORAL | Status: DC
  Administered 2023-11-13: 120 mg via ORAL
  Filled 2023-11-12 (×2): qty 4

## 2023-11-12 MED ORDER — HYDRALAZINE HCL 50 MG PO TABS
50.0000 mg | ORAL_TABLET | Freq: Three times a day (TID) | ORAL | Status: DC
  Administered 2023-11-12 – 2023-11-13 (×4): 50 mg via ORAL
  Filled 2023-11-12 (×4): qty 1

## 2023-11-12 MED ORDER — PENTAFLUOROPROP-TETRAFLUOROETH EX AERO: 1.0000 | INHALATION_SPRAY | CUTANEOUS | Status: DC | PRN

## 2023-11-12 MED ORDER — CHLORHEXIDINE GLUCONATE CLOTH 2 % EX PADS
6.0000 | MEDICATED_PAD | Freq: Every day | CUTANEOUS | Status: DC
  Filled 2023-11-12 (×2): qty 6

## 2023-11-12 MED ORDER — HEPARIN SODIUM (PORCINE) 1000 UNIT/ML DIALYSIS: 1000.0000 [IU] | INTRAMUSCULAR | Status: DC | PRN

## 2023-11-12 NOTE — Assessment & Plan Note (Signed)
BP stable Titrate home regimen 

## 2023-11-12 NOTE — Procedures (Signed)
Received patient in bed to unit.  Alert and oriented.  Informed consent signed and in chart.   TX duration: 3hrs  Patient tolerated well.  Transported back to the room  Alert, without acute distress.  Hand-off given to patient's nurse.   Access used: Left upper arm AVF.  Access issues: NONE  Total UF removed: 1.5L Medication(s) given: NONE  [facility]

## 2023-11-12 NOTE — Assessment & Plan Note (Signed)
Decompensated resp failure requiring BIPAP in setting of volume overload Volume overload likely secondary to acute on chronic HFrEF, increased fluid intake and baseline ESRD on HD MWF  Pt reports excess fluid intake including [place] [employer] lemonades  CXR concerning for CHF- no overt infection noted   BNP pending  Still making urine  S/p IV lasix in the ER  Due for HD today- Dr. Cherylann Ratel consulted  Monitor volume status w/ diuresis and HD  Follow

## 2023-11-12 NOTE — Assessment & Plan Note (Signed)
Cont asa, metoprolol, ARB and statin.

## 2023-11-12 NOTE — Progress Notes (Signed)
[facility]  ROUNDING NOTE   Subjective:   Patient well-known to [place] from prior admissions. Normally goes to [facility].  Last treatment completed on Saturday  Patient reports she went to dialysis this morning, was too short of breath to attempt treatment.  EMS was called and patient transported to emergency department.  Patient reports not missing any recent treatments.  Denies excessive fluid or sodium intake over the weekend.  Labs on ED arrival unremarkable for renal patient. BNP greater than 4500. Hgb 10.7. Respiratory panel negative. Chest xray favors CHF. Patient placed on Bipap and dialysis scheduled.    Objective:  Vital signs in last 24 hours:  Temp:  [97.8 F (36.6 C)-98.2 F (36.8 C)] 98.2 F (36.8 C) (12/09 1359) Pulse Rate:  [80-101] 83 (12/09 1330) Resp:  [19-34] 34 (12/09 1330) BP: (166-224)/(74-100) 172/83 (12/09 1330) SpO2:  [47 %-100 %] 99 % (12/09 1330) FiO2 (%):  [50 %] 50 % (12/09 0848)  Weight change:  There were no vitals filed for this visit.   Intake/Output: No intake/output data recorded.   Intake/Output this shift:  No intake/output data recorded.  Physical Exam: General: No acute distress  Head: Normocephalic, atraumatic. Moist oral mucosal membranes  Lungs:  Bipap  Heart: S1S2 no rubs  Abdomen:  Soft, nontender, bowel sounds present  Extremities: Trace peripheral edema.  Neurologic: Awake, alert, following commands  Skin: No acute rash  Access: Left upper extremity AV fistula    Basic Metabolic Panel: Recent Labs  Lab 11/12/23 0559  NA 142  K 5.0  CL 100  CO2 28  GLUCOSE 157*  BUN 69*  CREATININE 7.87*  CALCIUM 9.0    Liver Function Tests: No results for input(s): "AST", "ALT", "ALKPHOS", "BILITOT", "PROT", "ALBUMIN" in the last 168 hours.  No results for input(s): "LIPASE", "AMYLASE" in the last 168 hours. No results for input(s): "AMMONIA" in the last 168 hours.  CBC: Recent Labs   Lab 11/12/23 0559  WBC 7.3  NEUTROABS 6.3  HGB 10.7*  HCT 34.7*  MCV 90.4  PLT 207    Cardiac Enzymes: No results for input(s): "CKTOTAL", "CKMB", "CKMBINDEX", "TROPONINI" in the last 168 hours.  BNP: Invalid input(s): "POCBNP"  CBG: Recent Labs  Lab 11/12/23 0825 11/12/23 1245  GLUCAP 89 76    Microbiology: Results for orders placed or performed during the hospital encounter of 11/12/23  Resp panel by RT-PCR (RSV, Flu A&B, Covid) Anterior Nasal Swab     Status: None   Collection Time: 11/12/23  6:00 AM   Specimen: Anterior Nasal Swab  Result Value Ref Range Status   SARS Coronavirus 2 by RT PCR NEGATIVE NEGATIVE Final    Comment: (NOTE) SARS-CoV-2 target nucleic acids are NOT DETECTED.  The SARS-CoV-2 RNA is generally detectable in upper respiratory specimens during the acute phase of infection. The lowest concentration of SARS-CoV-2 viral copies this assay can detect is 138 copies/mL. A negative result does not preclude SARS-Cov-2 infection and should not be used as the sole basis for treatment or other patient management decisions. A negative result may occur with  improper specimen collection/handling, submission of specimen other than nasopharyngeal swab, presence of viral mutation(s) within the areas targeted by this assay, and inadequate number of viral copies(<138 copies/mL). A negative result must be combined with clinical observations, patient history, and epidemiological information. The expected result is Negative.  Fact Sheet for Patients:  BloggerCourse.com  Fact Sheet for Healthcare Providers:  SeriousBroker.it  This test is  no t yet approved or cleared by the [place] and  has been authorized for detection and/or diagnosis of SARS-CoV-2 by FDA under an Emergency Use Authorization (EUA). This EUA will remain  in effect (meaning this test can be used) for the duration of the COVID-19  declaration under Section 564(b)(1) of the Act, 21 U.S.C.section 360bbb-3(b)(1), unless the authorization is terminated  or revoked sooner.       Influenza A by PCR NEGATIVE NEGATIVE Final   Influenza B by PCR NEGATIVE NEGATIVE Final    Comment: (NOTE) The Xpert Xpress SARS-CoV-2/FLU/RSV plus assay is intended as an aid in the diagnosis of influenza from Nasopharyngeal swab specimens and should not be used as a sole basis for treatment. Nasal washings and aspirates are unacceptable for Xpert Xpress SARS-CoV-2/FLU/RSV testing.  Fact Sheet for Patients: BloggerCourse.com  Fact Sheet for Healthcare Providers: SeriousBroker.it  This test is not yet approved or cleared by the [place] FDA and has been authorized for detection and/or diagnosis of SARS-CoV-2 by FDA under an Emergency Use Authorization (EUA). This EUA will remain in effect (meaning this test can be used) for the duration of the COVID-19 declaration under Section 564(b)(1) of the Act, 21 U.S.C. section 360bbb-3(b)(1), unless the authorization is terminated or revoked.     Resp Syncytial Virus by PCR NEGATIVE NEGATIVE Final    Comment: (NOTE) Fact Sheet for Patients: BloggerCourse.com  Fact Sheet for Healthcare Providers: SeriousBroker.it  This test is not yet approved or cleared by the [place] FDA and has been authorized for detection and/or diagnosis of SARS-CoV-2 by FDA under an Emergency Use Authorization (EUA). This EUA will remain in effect (meaning this test can be used) for the duration of the COVID-19 declaration under Section 564(b)(1) of the Act, 21 U.S.C. section 360bbb-3(b)(1), unless the authorization is terminated or revoked.  Performed at [facility], [address] [place], [place] [address]     Coagulation Studies: No results for input(s): "LABPROT", "INR" in the last 72  hours.  Urinalysis: No results for input(s): "COLORURINE", "LABSPEC", "PHURINE", "GLUCOSEU", "HGBUR", "BILIRUBINUR", "KETONESUR", "PROTEINUR", "UROBILINOGEN", "NITRITE", "LEUKOCYTESUR" in the last 72 hours.  Invalid input(s): "APPERANCEUR"    Imaging: DG Chest Portable 1 View  Result Date: 11/12/2023 CLINICAL DATA:  77 year old female with history of shortness of breath and hypoxia. EXAM: PORTABLE CHEST 1 VIEW COMPARISON:  Chest x-ray 10/22/2023. FINDINGS: Lung volumes are normal. There is cephalization of the pulmonary vasculature, indistinctness of the interstitial markings, and patchy airspace disease throughout the lungs bilaterally suggestive of moderate pulmonary edema. Trace right and small left pleural effusions. No pneumothorax. Moderate cardiomegaly. Upper mediastinal contours are distorted by patient's rotation to the left. Atherosclerotic calcifications are noted in the thoracic aorta. IMPRESSION: 1. Overall, the appearance the chest favors congestive heart failure, as above. 2. Aortic atherosclerosis. Electronically Signed   By: Trudie Reed M.D.   On: 11/12/2023 06:34     Medications:      aspirin EC  81 mg Oral Daily   Chlorhexidine Gluconate Cloth  6 each Topical Q0600   cinacalcet  120 mg Oral Q supper   [START ON 11/13/2023] furosemide  80 mg Oral Once per day on Sunday Tuesday Thursday Saturday   heparin  5,000 Units Subcutaneous Q8H   hydrALAZINE  50 mg Oral Q8H   insulin aspart  0-6 Units Subcutaneous TID WC   pantoprazole  40 mg Oral Daily   rosuvastatin  5 mg Oral See admin instructions   sacubitril-valsartan  1 tablet Oral Q12H   senna-docusate  2 tablet Oral Daily   sevelamer carbonate  1,600 mg Oral TID WC   ondansetron **OR** ondansetron (ZOFRAN) IV  Assessment/ Plan:  77 y.o. female with past medical history of ESRD on HD, hypertension, hyperlipidemia, CVA, macular degeneration who was admitted for acute respiratory failure requiring BiPAP.  [facility]  nephrology/MWF/[facility]/left upper extremity AV fistula  1.  ESRD on HD.  Last treatment completed on Saturday.  Reported shortness of breath prior to dialysis initiation this morning.  Will perform dialysis today, UF goal 1.5 to 2 L as tolerated.  2.  Acute respiratory failure/acute systolic and diastolic heart failure with ejection fraction of 35 to 40%..  Patient is on BiPAP.  Will attempt fluid removal with dialysis.  3.  Anemia of chronic kidney disease.   Lab Results  Component Value Date   HGB 10.7 (L) 11/12/2023  Hemoglobin 10.7, at goal.  Hold off on Epogen for now.   4.  Secondary hyperparathyroidism.   Lab Results  Component Value Date   CALCIUM 9.0 11/12/2023   CAION 1.07 (L) 10/25/2023   PHOS 5.0 (H) 10/24/2023  Will continue to monitor bone minerals during this admission.  Continue Renvela    LOS: 0 Osa Fogarty 12/9/20241:59 PM

## 2023-11-12 NOTE — ED Provider Notes (Signed)
[facility] Provider Note    Event Date/Time   First MD Initiated Contact with Patient 11/12/23 0530     (approximate)   History   Shortness of Breath   HPI  Kristen Francis is a 77 y.o. female with history of end-stage renal disease on hemodialysis Monday, Wednesday and Friday, hypertension, hyperlipidemia, stroke who presents to the emergency department shortness of breath that started tonight.  Last full dialysis was on Friday.  Is due for dialysis today.  Denies any fever but has had cough.  No lower extremity swelling or discomfort.  No chest pain.  Hypoxic to the low 80s with EMS.  Here on room air sats were as low as the upper 40s per nursing staff.  Sats 95% on nonrebreather.  Does not wear oxygen chronically.  Denies history of asthma, COPD, CHF, PE or DVT.  Patient does have history of CHF with a EF of 35 to 40% on echocardiogram 10/22/2023.  She is on Lasix.  Still makes urine.   History provided by patient, EMS.    Past Medical History:  Diagnosis Date   Anemia    Arthritis    Chronic kidney disease    ESRD   Complication of anesthesia    had procedure and felt incision early 80s   Gout    Hyperlipemia    Hypertension    Macular degeneration, right eye    Stroke [facility])     Past Surgical History:  Procedure Laterality Date   A/V FISTULAGRAM Left 12/25/2018   Procedure: A/V FISTULAGRAM;  Surgeon: Renford Dills, MD;  Location: [facility] INVASIVE CV LAB;  Service: Cardiovascular;  Laterality: Left;   A/V FISTULAGRAM Left 10/09/2023   Procedure: A/V Fistulagram;  Surgeon: Renford Dills, MD;  Location: [facility] INVASIVE CV LAB;  Service: Cardiovascular;  Laterality: Left;   ABDOMINAL HYSTERECTOMY     APPENDECTOMY     AV FISTULA PLACEMENT Left 07/12/2018   Procedure: INSERTION OF ARTERIOVENOUS (AV) GORE-TEX GRAFT ARM ( BRACHIAL AXILLARY );  Surgeon: Renford Dills, MD;  Location: [facility] ORS;  Service: Vascular;  Laterality: Left;    COLONOSCOPY WITH PROPOFOL N/A 01/14/2016   Procedure: COLONOSCOPY WITH PROPOFOL;  Surgeon: Scot Jun, MD;  Location: [facility] ENDOSCOPY;  Service: Endoscopy;  Laterality: N/A;   COLONOSCOPY WITH PROPOFOL N/A 02/14/2022   Procedure: COLONOSCOPY WITH PROPOFOL;  Surgeon: Regis Bill, MD;  Location: [facility] ENDOSCOPY;  Service: Endoscopy;  Laterality: N/A;   RIGHT/LEFT HEART CATH AND CORONARY ANGIOGRAPHY N/A 10/25/2023   Procedure: RIGHT/LEFT HEART CATH AND CORONARY ANGIOGRAPHY;  Surgeon: Marcina Millard, MD;  Location: [facility] INVASIVE CV LAB;  Service: Cardiovascular;  Laterality: N/A;   TONSILLECTOMY      MEDICATIONS:  Prior to Admission medications   Medication Sig Start Date End Date Taking? Authorizing Provider  aspirin EC 81 MG EC tablet Take 1 tablet (81 mg total) by mouth daily. Swallow whole. 06/05/21   Enedina Finner, MD  B Complex-C-Folic Acid (RENAL-VITE) 0.8 MG TABS Take 0.8 mg by mouth every evening. On dialysis days    [provider]  benzonatate (TESSALON) 100 MG capsule Take 1 capsule (100 mg total) by mouth 2 (two) times daily as needed for cough. 12/11/22   Sallyanne Kuster, NP  carvedilol (COREG) 25 MG tablet TAKE 1 TABLET(25 MG) BY MOUTH TWICE DAILY WITH A MEAL 05/04/23   Sallyanne Kuster, NP  cholecalciferol (VITAMIN D) 1000 units tablet Take 1,000 Units by mouth daily.  [provider]  cinacalcet (SENSIPAR) 60 MG tablet Take 120 mg by mouth daily with supper.    [provider]  dronabinol (MARINOL) 5 MG capsule Take 1 capsule by mouth before meals twice daily on non-dialysis days 10/18/23   Sallyanne Kuster, NP  ferrous sulfate 325 (65 FE) MG tablet Take 1 tablet (325 mg total) by mouth 2 (two) times daily with a meal. 06/04/21   Enedina Finner, MD  furosemide (LASIX) 80 MG tablet Take 1 tablet (80 mg total) by mouth 4 (four) times a week. On non-dialysis days 03/08/23   Darlin Priestly, MD  hydrALAZINE (APRESOLINE) 50 MG tablet TAKE 1 TABLET(50 MG) BY  MOUTH THREE TIMES DAILY. 10/17/23   Sallyanne Kuster, NP  lidocaine-prilocaine (EMLA) cream Apply 1 Application topically as directed.    [provider]  losartan (COZAAR) 100 MG tablet Take 1 tablet (100 mg total) by mouth daily. Increased from 50 mg. 10/26/23   Darlin Priestly, MD  omeprazole (PRILOSEC) 40 MG capsule TAKE 1 CAPSULE(40 MG) BY MOUTH DAILY 04/18/23   Sallyanne Kuster, NP  rosuvastatin (CRESTOR) 5 MG tablet Take 5 mg by mouth See admin instructions. Take 1 tablet (5mg ) by mouth on non-dialysis days    [provider]  sacubitril-valsartan (ENTRESTO) 49-51 MG Take 1 tablet by mouth every 12 (twelve) hours. 10/29/23   [provider]  sennosides-docusate sodium (SENOKOT-S) 8.6-50 MG tablet Take 2 tablets by mouth daily.    [provider]  sevelamer carbonate (RENVELA) 800 MG tablet Take 1,600 mg by mouth 3 (three) times daily with meals.    [provider]    Physical Exam   Triage Vital Signs: ED Triage Vitals  Encounter Vitals Group     BP 11/12/23 0533 (!) 214/86     Systolic BP Percentile --      Diastolic BP Percentile --      Pulse Rate 11/12/23 0533 82     Resp 11/12/23 0603 (!) 29     Temp 11/12/23 0606 97.8 F (36.6 C)     Temp Source 11/12/23 0606 Axillary     SpO2 11/12/23 0533 (!) 47 %     Weight --      Height --      Head Circumference --      Peak Flow --      Pain Score 11/12/23 0556 0     Pain Loc --      Pain Education --      Exclude from Growth Chart --     Most recent vital signs: Vitals:   11/12/23 0615 11/12/23 0630  BP: (!) 169/82 (!) 175/100  Pulse: 87 92  Resp: (!) 31 20  Temp:    SpO2: 92% 94%    CONSTITUTIONAL: Alert, responds appropriately to questions.  Elderly HEAD: Normocephalic, atraumatic EYES: Conjunctivae clear, pupils appear equal, sclera nonicteric ENT: normal nose; moist mucous membranes NECK: Supple, normal ROM CARD: Regular and tachycardic; S1 and S2 appreciated RESP:  Patient is tachypneic.  Hypoxic requiring nonrebreather.  Rhonchorous breath sounds diffusely.  No wheezing.  Speaking short sentences. ABD/GI: Non-distended; soft, non-tender, no rebound, no guarding, no peritoneal signs BACK: The back appears normal EXT: Normal ROM in all joints; no deformity noted, no edema, no calf tenderness or calf swelling SKIN: Normal color for age and race; warm; no rash on exposed skin NEURO: Moves all extremities equally, normal speech PSYCH: The patient's mood and manner are appropriate.   ED Results / Procedures /  Treatments   LABS: (all labs ordered are listed, but only abnormal results are displayed) Labs Reviewed  CBC WITH DIFFERENTIAL/PLATELET - Abnormal; Notable for the following components:      Result Value   RBC 3.84 (*)    Hemoglobin 10.7 (*)    HCT 34.7 (*)    RDW 18.2 (*)    Lymphs Abs 0.6 (*)    All other components within normal limits  BASIC METABOLIC PANEL - Abnormal; Notable for the following components:   Glucose, Bld 157 (*)    BUN 69 (*)    Creatinine, Ser 7.87 (*)    GFR, Estimated 5 (*)    All other components within normal limits  BLOOD GAS, VENOUS - Abnormal; Notable for the following components:   pCO2, Ven 62 (*)    Bicarbonate 31.9 (*)    Acid-Base Excess 4.0 (*)    All other components within normal limits  TROPONIN I (HIGH SENSITIVITY) - Abnormal; Notable for the following components:   Troponin I (High Sensitivity) 32 (*)    All other components within normal limits  RESP PANEL BY RT-PCR (RSV, FLU A&B, COVID)  RVPGX2  BRAIN NATRIURETIC PEPTIDE  TROPONIN I (HIGH SENSITIVITY)     EKG:  EKG Interpretation Date/Time:  Monday November 12 2023 05:43:36 EST Ventricular Rate:  99 PR Interval:  166 QRS Duration:  90 QT Interval:  380 QTC Calculation: 488 R Axis:   31  Text Interpretation: Sinus rhythm Left ventricular hypertrophy Anterior Q waves, possibly due to LVH Confirmed by Rochele Raring [phone]) on 11/12/2023  5:47:21 AM         RADIOLOGY: My personal review and interpretation of imaging: Chest x-ray shows CHF exacerbation.  I have personally reviewed all radiology reports.   DG Chest Portable 1 View  Result Date: 11/12/2023 CLINICAL DATA:  77 year old female with history of shortness of breath and hypoxia. EXAM: PORTABLE CHEST 1 VIEW COMPARISON:  Chest x-ray 10/22/2023. FINDINGS: Lung volumes are normal. There is cephalization of the pulmonary vasculature, indistinctness of the interstitial markings, and patchy airspace disease throughout the lungs bilaterally suggestive of moderate pulmonary edema. Trace right and small left pleural effusions. No pneumothorax. Moderate cardiomegaly. Upper mediastinal contours are distorted by patient's rotation to the left. Atherosclerotic calcifications are noted in the thoracic aorta. IMPRESSION: 1. Overall, the appearance the chest favors congestive heart failure, as above. 2. Aortic atherosclerosis. Electronically Signed   By: Trudie Reed M.D.   On: 11/12/2023 06:34     PROCEDURES:  Critical Care performed: Yes, see critical care procedure note(s)   CRITICAL CARE Performed by: Baxter Hire Miah Boye   Total critical care time: 40 minutes  Critical care time was exclusive of separately billable procedures and treating other patients.  Critical care was necessary to treat or prevent imminent or life-threatening deterioration.  Critical care was time spent personally by me on the following activities: development of treatment plan with patient and/or surrogate as well as nursing, discussions with consultants, evaluation of patient's response to treatment, examination of patient, obtaining history from patient or surrogate, ordering and performing treatments and interventions, ordering and review of laboratory studies, ordering and review of radiographic studies, pulse oximetry and re-evaluation of patient's condition.   Marland Kitchen1-3 Lead EKG  Interpretation  Performed by: Adelena Desantiago, Layla Maw, DO Authorized by: Bedelia Pong, Layla Maw, DO     Interpretation: abnormal     ECG rate:  100   ECG rate assessment: tachycardic     Rhythm: sinus tachycardia  Ectopy: none     Conduction: normal       IMPRESSION / MDM / ASSESSMENT AND PLAN / ED COURSE  I reviewed the triage vital signs and the nursing notes.    Patient here with shortness of breath, new onset hypoxia.  The patient is on the cardiac monitor to evaluate for evidence of arrhythmia and/or significant heart rate changes.   DIFFERENTIAL DIAGNOSIS (includes but not limited to):   Volume overload, CHF exacerbation, pneumonia, pneumothorax, viral URI, PE, ACS, hypertensive emergency   Patient's presentation is most consistent with acute presentation with potential threat to life or bodily function.   PLAN: Will obtain cardiac labs, chest x-ray, COVID and flu swabs.  Will place on BiPAP.  Will obtain VBG.  Will give hydralazine for elevated blood pressure.   MEDICATIONS GIVEN IN ED: Medications  furosemide (LASIX) injection 40 mg (40 mg Intravenous Given 11/12/23 0981)     ED COURSE: Patient's blood pressure improved prior to getting hydralazine.  She is improving on BiPAP.  Labs show stable anemia.  No leukocytosis.  Normal potassium.  Troponin is slightly elevated at 32 which appears to be her baseline.  Chest x-ray shows CHF exacerbation when reviewed/interpreted by myself and the radiologist.  Will discuss with hospitalist for admission.   VBG shows normal pH.  Elevated pCO2 but also elevated bicarb suggestive of compensated blood gas.  COVID, flu and RSV negative.  CONSULTS:  Consulted and discussed patient's case with hospitalist, Dr. Alvester Morin.  I have recommended admission and consulting physician agrees and will place admission orders.  Patient (and family if present) agree with this plan.   I reviewed all nursing notes, vitals, pertinent previous records.  All  labs, EKGs, imaging ordered have been independently reviewed and interpreted by myself.  7:15 AM  Dr. Cherylann Ratel with nephrology consulted for urgent dialysis today.  OUTSIDE RECORDS REVIEWED: Reviewed last cardiology note on 10/29/2023 where patient was seen for heart failure because last echocardiogram showed an EF of 35 to 40%.       FINAL CLINICAL IMPRESSION(S) / ED DIAGNOSES   Final diagnoses:  Acute respiratory failure with hypoxia (HCC)  Acute on chronic congestive heart failure, unspecified heart failure type (HCC)     Rx / DC Orders   ED Discharge Orders     None        Note:  This document was prepared using Dragon voice recognition software and may include unintentional dictation errors.   Rye Decoste, Layla Maw, DO 11/12/23 [phone]

## 2023-11-12 NOTE — Assessment & Plan Note (Signed)
+   volume overload in setting of acute on chronic HFrEF and ESRD on HD MWF w/ BIPAP in place  Pt self reports excessive fluid intake  No missed HD  CXR w/ cardiomegaly BNP pending  Still making some urine  S/p IV lasix in the ER Pending HD today  Monitor volume status w/ diuresis and HD  Follow closely

## 2023-11-12 NOTE — Assessment & Plan Note (Signed)
2D ECHO 10/2023 w/ EF 35-40% and grade 2 DD  Baseline ESRD on HD MWF  Mixed hemodialysis and diuresis volume status as pt still making some urine  Pt does self report excess fluid intake including excessive [employer] lemonades  + volume overload today requiring BIPAP  S/p IV lasix in ER  Will continue home diuretic regimen  Pending HD Monitor volume status  Cardiology consultation as clinically indicated Discussed fluid restriction at length at the bedside  Follow closely

## 2023-11-12 NOTE — Assessment & Plan Note (Signed)
Baseline ESRD on HD MWF  Due for HD today in setting of volume overload  Follow up nephrology recommendations

## 2023-11-12 NOTE — H&P (Addendum)
History and Physical    Patient: Kristen Francis FAO:[id number] DOB: Dec 18, 1945 DOA: 11/12/2023 DOS: the patient was seen and examined on 11/12/2023 PCP: Sallyanne Kuster, NP  Patient coming from: Home  Chief Complaint:  Chief Complaint  Patient presents with   Shortness of Breath   HPI: Kristen Francis is a 77 y.o. female with medical history significant of ESRD on hemodialysis Monday Wednesday Friday, CAD, history of TIA, HFrEF presenting with volume overload, acute on chronic HFrEF.  Patient noted to come from dialysis center by EMS secondary to significant shortness of breath.  Patient reports worsening shortness of breath over the past 12 hours.  Noted to have been recently admitted November 18 through November 22 for similar issues.  Was formally diagnosed with HFrEF by echocardiogram during that admission in addition to pulmonary hypertension.  Also had left and right heart catheterization that was otherwise stable.  Was formally evaluated by cardiology.  Started on a course of Lasix on nondialysis days.  Has been compliant with regimen.  Patient does admit to high fluid intake including drinking a lot of [employer] lemonade.  No chest pain.  No abdominal pain.  No fevers or chills.  No reported tobacco abuse.  Has been compliant with medication regimen including diuretic BP medications. Presented to the ER afebrile, hemodynamically stable.  Respirations into the 30s.  Satting mid 40s on room air.  Requiring BiPAP.  White count 7.3, hemoglobin 10.7, platelets 207, VBG stable, COVID flu and RSV negative, creatinine 7.9, glucose 157.  Chest x-ray with CHF pattern. Review of Systems: As mentioned in the history of present illness. All other systems reviewed and are negative. Past Medical History:  Diagnosis Date   Anemia    Arthritis    Chronic kidney disease    ESRD   Complication of anesthesia    had procedure and felt incision early 80s   Gout    Hyperlipemia     Hypertension    Macular degeneration, right eye    Stroke [facility])    Past Surgical History:  Procedure Laterality Date   A/V FISTULAGRAM Left 12/25/2018   Procedure: A/V FISTULAGRAM;  Surgeon: Renford Dills, MD;  Location: [facility] INVASIVE CV LAB;  Service: Cardiovascular;  Laterality: Left;   A/V FISTULAGRAM Left 10/09/2023   Procedure: A/V Fistulagram;  Surgeon: Renford Dills, MD;  Location: [facility] INVASIVE CV LAB;  Service: Cardiovascular;  Laterality: Left;   ABDOMINAL HYSTERECTOMY     APPENDECTOMY     AV FISTULA PLACEMENT Left 07/12/2018   Procedure: INSERTION OF ARTERIOVENOUS (AV) GORE-TEX GRAFT ARM ( BRACHIAL AXILLARY );  Surgeon: Renford Dills, MD;  Location: [facility] ORS;  Service: Vascular;  Laterality: Left;   COLONOSCOPY WITH PROPOFOL N/A 01/14/2016   Procedure: COLONOSCOPY WITH PROPOFOL;  Surgeon: Scot Jun, MD;  Location[facility] ENDOSCOPY;  Service: Endoscopy;  Laterality: N/A;   COLONOSCOPY WITH PROPOFOL N/A 02/14/2022   Procedure: COLONOSCOPY WITH PROPOFOL;  Surgeon: Regis Bill, MD;  Location: [facility] ENDOSCOPY;  Service: Endoscopy;  Laterality: N/A;   RIGHT/LEFT HEART CATH AND CORONARY ANGIOGRAPHY N/A 10/25/2023   Procedure: RIGHT/LEFT HEART CATH AND CORONARY ANGIOGRAPHY;  Surgeon: Marcina Millard, MD;  Location: [facility] INVASIVE CV LAB;  Service: Cardiovascular;  Laterality: N/A;   TONSILLECTOMY     Social History:  reports that she has quit smoking. She has never used smokeless tobacco. She reports that she does not drink alcohol and does not use drugs.  Allergies  Allergen Reactions   Other  Other (See Comments)    Pt does not receive Blood    Penicillins Hives    Has patient had a PCN reaction causing immediate rash, facial/tongue/throat swelling, SOB or lightheadedness with hypotension: YES Has patient had a PCN reaction causing severe rash involving mucus membranes or skin necrosis: no Has patient had a PCN reaction that required hospitalization:  no Has patient had a PCN reaction occurring within the last 10 years: no If all of the above answers are "NO", then may proceed with Cephalosporin use.    Sodium Bicarbonate Itching    Family History  Problem Relation Age of Onset   Kidney disease Mother    Breast cancer Neg Hx     Prior to Admission medications   Medication Sig Start Date End Date Taking? Authorizing Provider  aspirin EC 81 MG EC tablet Take 1 tablet (81 mg total) by mouth daily. Swallow whole. 06/05/21   Enedina Finner, MD  B Complex-C-Folic Acid (RENAL-VITE) 0.8 MG TABS Take 0.8 mg by mouth every evening. On dialysis days    [provider]  benzonatate (TESSALON) 100 MG capsule Take 1 capsule (100 mg total) by mouth 2 (two) times daily as needed for cough. 12/11/22   Sallyanne Kuster, NP  carvedilol (COREG) 25 MG tablet TAKE 1 TABLET(25 MG) BY MOUTH TWICE DAILY WITH A MEAL 05/04/23   Sallyanne Kuster, NP  cholecalciferol (VITAMIN D) 1000 units tablet Take 1,000 Units by mouth daily.     [provider]  cinacalcet (SENSIPAR) 60 MG tablet Take 120 mg by mouth daily with supper.    [provider]  dronabinol (MARINOL) 5 MG capsule Take 1 capsule by mouth before meals twice daily on non-dialysis days 10/18/23   Sallyanne Kuster, NP  ferrous sulfate 325 (65 FE) MG tablet Take 1 tablet (325 mg total) by mouth 2 (two) times daily with a meal. 06/04/21   Enedina Finner, MD  furosemide (LASIX) 80 MG tablet Take 1 tablet (80 mg total) by mouth 4 (four) times a week. On non-dialysis days 03/08/23   Darlin Priestly, MD  hydrALAZINE (APRESOLINE) 50 MG tablet TAKE 1 TABLET(50 MG) BY MOUTH THREE TIMES DAILY. 10/17/23   Sallyanne Kuster, NP  lidocaine-prilocaine (EMLA) cream Apply 1 Application topically as directed.    [provider]  losartan (COZAAR) 100 MG tablet Take 1 tablet (100 mg total) by mouth daily. Increased from 50 mg. 10/26/23   Darlin Priestly, MD  omeprazole (PRILOSEC) 40 MG capsule TAKE 1 CAPSULE(40  MG) BY MOUTH DAILY 04/18/23   Sallyanne Kuster, NP  rosuvastatin (CRESTOR) 5 MG tablet Take 5 mg by mouth See admin instructions. Take 1 tablet (5mg ) by mouth on non-dialysis days    [provider]  sacubitril-valsartan (ENTRESTO) 49-51 MG Take 1 tablet by mouth every 12 (twelve) hours. 10/29/23   [provider]  sennosides-docusate sodium (SENOKOT-S) 8.6-50 MG tablet Take 2 tablets by mouth daily.    [provider]  sevelamer carbonate (RENVELA) 800 MG tablet Take 1,600 mg by mouth 3 (three) times daily with meals.    [provider]    Physical Exam: Vitals:   11/12/23 0603 11/12/23 0606 11/12/23 0615 11/12/23 0630  BP:   (!) 169/82 (!) 175/100  Pulse:   87 92  Resp: (!) 29  (!) 31 20  Temp:  97.8 F (36.6 C)    TempSrc:  Axillary    SpO2:   92% 94%   Physical Exam Constitutional:  Appearance: She is normal weight.  HENT:     Head: Normocephalic and atraumatic.     Nose: Nose normal.  Eyes:     Pupils: Pupils are equal, round, and reactive to light.  Cardiovascular:     Rate and Rhythm: Normal rate and regular rhythm.  Pulmonary:     Effort: Pulmonary effort is normal.  Abdominal:     General: Bowel sounds are normal.  Musculoskeletal:     Cervical back: Normal range of motion.  Skin:    General: Skin is warm.  Neurological:     General: No focal deficit present.  Psychiatric:        Mood and Affect: Mood normal.     Data Reviewed:  There are no new results to review at this time.  DG Chest Portable 1 View CLINICAL DATA:  77 year old female with history of shortness of breath and hypoxia.  EXAM: PORTABLE CHEST 1 VIEW  COMPARISON:  Chest x-ray 10/22/2023.  FINDINGS: Lung volumes are normal. There is cephalization of the pulmonary vasculature, indistinctness of the interstitial markings, and patchy airspace disease throughout the lungs bilaterally suggestive of moderate pulmonary edema. Trace right and small left  pleural effusions. No pneumothorax. Moderate cardiomegaly. Upper mediastinal contours are distorted by patient's rotation to the left. Atherosclerotic calcifications are noted in the thoracic aorta.  IMPRESSION: 1. Overall, the appearance the chest favors congestive heart failure, as above. 2. Aortic atherosclerosis.  Electronically Signed   By: Trudie Reed M.D.   On: 11/12/2023 06:34  Lab Results  Component Value Date   WBC 7.3 11/12/2023   HGB 10.7 (L) 11/12/2023   HCT 34.7 (L) 11/12/2023   MCV 90.4 11/12/2023   PLT 207 11/12/2023   Last metabolic panel Lab Results  Component Value Date   GLUCOSE 157 (H) 11/12/2023   NA 142 11/12/2023   K 5.0 11/12/2023   CL 100 11/12/2023   CO2 28 11/12/2023   BUN 69 (H) 11/12/2023   CREATININE 7.87 (H) 11/12/2023   GFRNONAA 5 (L) 11/12/2023   CALCIUM 9.0 11/12/2023   PHOS 5.0 (H) 10/24/2023   PROT 5.9 (L) 10/23/2023   ALBUMIN 2.9 (L) 10/24/2023   LABGLOB 3.2 07/20/2020   AGRATIO 1.5 07/02/2020   BILITOT 0.7 10/23/2023   ALKPHOS 32 (L) 10/23/2023   AST 13 (L) 10/23/2023   ALT 8 10/23/2023   ANIONGAP 14 11/12/2023    Assessment and Plan: * Volume overload + volume overload in setting of acute on chronic HFrEF and ESRD on HD MWF w/ BIPAP in place  Pt self reports excessive fluid intake  No missed HD  CXR w/ cardiomegaly BNP pending  Still making some urine  S/p IV lasix in the ER Pending HD today  Monitor volume status w/ diuresis and HD  Follow closely  Acute respiratory failure with hypoxia (HCC) Decompensated resp failure requiring BIPAP in setting of volume overload Volume overload likely secondary to acute on chronic HFrEF, increased fluid intake and baseline ESRD on HD MWF  Pt reports excess fluid intake including [employer] lemonades  CXR concerning for CHF- no overt infection noted   BNP pending  Still making urine  S/p IV lasix in the ER  Due for HD today- Dr. Cherylann Ratel consulted  Monitor volume status  w/ diuresis and HD  Follow    Acute on chronic heart failure with preserved ejection fraction (HFpEF) (HCC) 2D ECHO 10/2023 w/ EF 35-40% and grade 2 DD  Baseline ESRD on HD MWF  Mixed hemodialysis and diuresis volume status as pt still making some urine  Pt does self report excess fluid intake including excessive [employer] lemonades  + volume overload today requiring BIPAP  S/p IV lasix in ER  Will continue home diuretic regimen  Pending HD Monitor volume status  Cardiology consultation as clinically indicated Discussed fluid restriction at length at the bedside  Follow closely    End stage renal disease (HCC) Baseline ESRD on HD MWF  Due for HD today in setting of volume overload  Follow up nephrology recommendations   Essential hypertension BP stable  Titrate home regimen    TIA (transient ischemic attack) Cont asa and statin   Type 2 diabetes mellitus with other diabetic kidney complication (HCC) Blood sugars in 150s  SSI  Monitor    Elevated troponin Troponin 30s on presentation looks to be near baseline Otherwise monitor Reassess as appropriate   Greater than 50% was spent in counseling, critical care evaluation and coordination of care with patient Total encounter time 80 minutes or more Critical care time: 65 minutes     Advance Care Planning:   Code Status: Limited: Do not attempt resuscitation (DNR) -DNR-LIMITED -Do Not Intubate/DNI    Consults: Nephrology   Family Communication: No family at the bedside   Severity of Illness: The appropriate patient status for this patient is INPATIENT. Inpatient status is judged to be reasonable and necessary in order to provide the required intensity of service to ensure the patient's safety. The patient's presenting symptoms, physical exam findings, and initial radiographic and laboratory data in the context of their chronic comorbidities is felt to place them at high risk for further clinical deterioration.  Furthermore, it is not anticipated that the patient will be medically stable for discharge from the hospital within 2 midnights of admission.   * I certify that at the point of admission it is my clinical judgment that the patient will require inpatient hospital care spanning beyond 2 midnights from the point of admission due to high intensity of service, high risk for further deterioration and high frequency of surveillance required.*  Author: Floydene Flock, MD 11/12/2023 8:01 AM  For on call review [email or web address].

## 2023-11-12 NOTE — ED Notes (Signed)
Pt transported to dialysis

## 2023-11-12 NOTE — TOC Initial Note (Signed)
Transition of Care [facility]) - Initial/Assessment Note    Patient Details  Name: Kristen Francis MRN: [id number] Date of Birth: 06/19/1946  Transition of Care [facility]) CM/SW Contact:    Marquita Palms, LCSW Phone Number: 11/12/2023, 12:33 PM  Clinical Narrative:                  CSW met with patient bedside. Patient is not agreeable to SNF or HH at this time. 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 She reports that she has dialysis today. Patient uses [facility] needs. Patient reported that she has a appointment for PCP provider. Patient has no DME needs at this time. HRA completed by CSW. No other needs at this time.       Patient Goals and CMS Choice            Expected Discharge Plan and Services                                              Prior Living Arrangements/Services                       Activities of Daily Living      Permission Sought/Granted                  Emotional Assessment              Admission diagnosis:  Volume overload [E87.70] Patient Active Problem List   Diagnosis Date Noted   Acute on chronic heart failure with preserved ejection fraction (HFpEF) (HCC) 11/12/2023   Elevated troponin 11/12/2023   Volume overload 10/22/2023   Acute respiratory failure with hypoxia (HCC) 10/22/2023   SOB (shortness of breath) 03/02/2023   Sepsis (HCC) 03/02/2023   TIA (transient ischemic attack) 06/03/2021   Gastroesophageal reflux disease without esophagitis 12/16/2020   Nausea 12/16/2020   Left foot pain 10/16/2020   Left carotid stenosis 09/06/2020   Multinodular goiter 09/06/2020   Atherosclerosis of aorta (HCC) 08/18/2020   Other fatigue 07/25/2020   Left carotid bruit 07/25/2020   Decreased appetite 07/25/2020   Depression, major, single episode, moderate (HCC) 07/25/2020   Elevated ferritin 07/20/2020   Encounter for general adult medical examination  with abnormal findings 12/13/2019   Encounter for screening mammogram for malignant neoplasm of breast 12/13/2019   Dysuria 12/13/2019   Complication from renal dialysis device 12/02/2018   Allergic contact dermatitis, unspecified cause 09/21/2018   Coagulation defect, unspecified (HCC) 09/21/2018   Diarrhea, unspecified 09/21/2018   Fever, unspecified 09/21/2018   Headache, unspecified 09/21/2018   Hypertensive chronic kidney disease with stage 1 through stage 4 chronic kidney disease, or unspecified chronic kidney disease 09/21/2018   Iron deficiency anemia, unspecified 09/21/2018   Moderate protein-calorie malnutrition (HCC) 09/21/2018   Other fluid overload 09/21/2018   Pain, unspecified 09/21/2018   Secondary hyperparathyroidism of renal origin (HCC) 09/21/2018   Type 2 diabetes mellitus with other diabetic kidney complication (HCC) 09/21/2018   Anemia in chronic kidney disease 09/21/2018   Encounter for immunization 09/21/2018   Peripheral vascular disease (HCC) 09/21/2018   Furuncle of vulva 09/10/2018   End stage renal disease (HCC) 07/04/2018   Anemia in chronic renal disease 12/31/2017   Essential hypertension 12/31/2017   Stroke (HCC) 12/31/2017   Anemia 09/17/2017  Monoclonal gammopathy of unknown significance (MGUS) 09/17/2017   PCP:  Sallyanne Kuster, NP Pharmacy:   [id number] [place], [place] - [address] AT [facility] [address] [place] [address] Phone: [phone] Fax: [phone]     Social Determinants of Health (SDOH) Social History: SDOH Screenings   Food Insecurity: No Food Insecurity (10/22/2023)  Housing: Low Risk  (10/22/2023)  Transportation Needs: No Transportation Needs (10/22/2023)  Utilities: Not At Risk (10/22/2023)  Alcohol Screen: Low Risk  (09/06/2022)  Depression (PHQ2-9): Low Risk  (05/24/2023)  Tobacco Use: Medium Risk (11/12/2023)   SDOH Interventions:     Readmission  Risk Interventions    10/23/2023   10:07 AM  Readmission Risk Prevention Plan  Transportation Screening Complete  PCP or Specialist Appt within 5-7 Days Complete  Home Care Screening Complete  Medication Review (RN CM) Complete

## 2023-11-12 NOTE — ED Triage Notes (Signed)
Pt arrived via [facility] from dialysis center d/t SOB that began yesterday. Pt dialysis days are M,W,F and went today but was not able to start dialysis today d/t SOB. EMS reports pt was Sating in the low to mid 80s on RA and was given supplemental O2 via [place] 4l/min. When pt arrived she O2 sat on TRA was high 50's and was p[laced on NRB at 15 liter/min.

## 2023-11-12 NOTE — Assessment & Plan Note (Signed)
Blood sugars in 150s  SSI  Monitor

## 2023-11-12 NOTE — Assessment & Plan Note (Signed)
Troponin 30s on presentation looks to be near baseline Otherwise monitor Reassess as appropriate

## 2023-11-13 DIAGNOSIS — J9601 Acute respiratory failure with hypoxia: Secondary | ICD-10-CM

## 2023-11-13 DIAGNOSIS — I509 Heart failure, unspecified: Secondary | ICD-10-CM

## 2023-11-13 LAB — CBC
HCT: 28.3 % — ABNORMAL LOW (ref 36.0–46.0)
Hemoglobin: 8.8 g/dL — ABNORMAL LOW (ref 12.0–15.0)
MCH: 27.9 pg (ref 26.0–34.0)
MCHC: 31.1 g/dL (ref 30.0–36.0)
MCV: 89.8 fL (ref 80.0–100.0)
Platelets: 155 10*3/uL (ref 150–400)
RBC: 3.15 MIL/uL — ABNORMAL LOW (ref 3.87–5.11)
RDW: 17.9 % — ABNORMAL HIGH (ref 11.5–15.5)
WBC: 6 10*3/uL (ref 4.0–10.5)
nRBC: 0 % (ref 0.0–0.2)

## 2023-11-13 LAB — COMPREHENSIVE METABOLIC PANEL
ALT: 13 U/L (ref 0–44)
AST: 17 U/L (ref 15–41)
Albumin: 3.2 g/dL — ABNORMAL LOW (ref 3.5–5.0)
Alkaline Phosphatase: 33 U/L — ABNORMAL LOW (ref 38–126)
Anion gap: 10 (ref 5–15)
BUN: 39 mg/dL — ABNORMAL HIGH (ref 8–23)
CO2: 29 mmol/L (ref 22–32)
Calcium: 8.8 mg/dL — ABNORMAL LOW (ref 8.9–10.3)
Chloride: 98 mmol/L (ref 98–111)
Creatinine, Ser: 5.21 mg/dL — ABNORMAL HIGH (ref 0.44–1.00)
GFR, Estimated: 8 mL/min — ABNORMAL LOW (ref >60)
Glucose, Bld: 82 mg/dL (ref 70–99)
Potassium: 4.3 mmol/L (ref 3.5–5.1)
Sodium: 137 mmol/L (ref 135–145)
Total Bilirubin: 0.9 mg/dL (ref <1.2)
Total Protein: 6 g/dL — ABNORMAL LOW (ref 6.5–8.1)

## 2023-11-13 LAB — CBG MONITORING, ED
Glucose-Capillary: 137 mg/dL — ABNORMAL HIGH (ref 70–99)
Glucose-Capillary: 89 mg/dL (ref 70–99)
Glucose-Capillary: 105 mg/dL — ABNORMAL HIGH (ref 70–99)

## 2023-11-13 NOTE — Progress Notes (Signed)
[facility]  ROUNDING NOTE   Subjective:   Patient well-known to [place] from prior admissions. Normally goes to [facility].  Update patient seen sitting up in bed Alert and oriented Husband at bedside States dialysis went well yesterday  Remains on 4 L nasal cannula  Objective:  Vital signs in last 24 hours:  Temp:  [97.6 F (36.4 C)-98.6 F (37 C)] 98.5 F (36.9 C) (12/10 1210) Pulse Rate:  [72-122] 87 (12/10 1300) Resp:  [15-36] 20 (12/10 1300) BP: (132-179)/(73-91) 151/75 (12/10 1300) SpO2:  [90 %-100 %] 99 % (12/10 1300) Weight:  [47 kg-51.2 kg] 47 kg (12/09 2023)  Weight change:  Filed Weights   11/12/23 1658 11/12/23 2023  Weight: 51.2 kg 47 kg     Intake/Output: I/O last 3 completed shifts: In: -  Out: 1400 [Other:1400]   Intake/Output this shift:  No intake/output data recorded.  Physical Exam: General: No acute distress  Head: Normocephalic, atraumatic. Moist oral mucosal membranes  Lungs:  Diminished in bases, [place] O2  Heart: S1S2 no rubs  Abdomen:  Soft, nontender, bowel sounds present  Extremities: Trace peripheral edema.  Neurologic: Awake, alert, following commands  Skin: No acute rash  Access: Left upper extremity AV fistula    Basic Metabolic Panel: Recent Labs  Lab 11/12/23 0559 11/13/23 0518  NA 142 137  K 5.0 4.3  CL 100 98  CO2 28 29  GLUCOSE 157* 82  BUN 69* 39*  CREATININE 7.87* 5.21*  CALCIUM 9.0 8.8*    Liver Function Tests: Recent Labs  Lab 11/13/23 0518  AST 17  ALT 13  ALKPHOS 33*  BILITOT 0.9  PROT 6.0*  ALBUMIN 3.2*    No results for input(s): "LIPASE", "AMYLASE" in the last 168 hours. No results for input(s): "AMMONIA" in the last 168 hours.  CBC: Recent Labs  Lab 11/12/23 0559 11/13/23 0518  WBC 7.3 6.0  NEUTROABS 6.3  --   HGB 10.7* 8.8*  HCT 34.7* 28.3*  MCV 90.4 89.8  PLT 207 155    Cardiac Enzymes: No results for input(s): "CKTOTAL", "CKMB", "CKMBINDEX",  "TROPONINI" in the last 168 hours.  BNP: Invalid input(s): "POCBNP"  CBG: Recent Labs  Lab 11/12/23 0825 11/12/23 1245 11/13/23 0722 11/13/23 1138  GLUCAP 89 76 137* 89    Microbiology: Results for orders placed or performed during the hospital encounter of 11/12/23  Resp panel by RT-PCR (RSV, Flu A&B, Covid) Anterior Nasal Swab     Status: None   Collection Time: 11/12/23  6:00 AM   Specimen: Anterior Nasal Swab  Result Value Ref Range Status   SARS Coronavirus 2 by RT PCR NEGATIVE NEGATIVE Final    Comment: (NOTE) SARS-CoV-2 target nucleic acids are NOT DETECTED.  The SARS-CoV-2 RNA is generally detectable in upper respiratory specimens during the acute phase of infection. The lowest concentration of SARS-CoV-2 viral copies this assay can detect is 138 copies/mL. A negative result does not preclude SARS-Cov-2 infection and should not be used as the sole basis for treatment or other patient management decisions. A negative result may occur with  improper specimen collection/handling, submission of specimen other than nasopharyngeal swab, presence of viral mutation(s) within the areas targeted by this assay, and inadequate number of viral copies(<138 copies/mL). A negative result must be combined with clinical observations, patient history, and epidemiological information. The expected result is Negative.  Fact Sheet for Patients:  BloggerCourse.com  Fact Sheet for Healthcare Providers:  SeriousBroker.it  This test is  no t yet approved or cleared by the [place] and  has been authorized for detection and/or diagnosis of SARS-CoV-2 by FDA under an Emergency Use Authorization (EUA). This EUA will remain  in effect (meaning this test can be used) for the duration of the COVID-19 declaration under Section 564(b)(1) of the Act, 21 U.S.C.section 360bbb-3(b)(1), unless the authorization is terminated  or revoked  sooner.       Influenza A by PCR NEGATIVE NEGATIVE Final   Influenza B by PCR NEGATIVE NEGATIVE Final    Comment: (NOTE) The Xpert Xpress SARS-CoV-2/FLU/RSV plus assay is intended as an aid in the diagnosis of influenza from Nasopharyngeal swab specimens and should not be used as a sole basis for treatment. Nasal washings and aspirates are unacceptable for Xpert Xpress SARS-CoV-2/FLU/RSV testing.  Fact Sheet for Patients: BloggerCourse.com  Fact Sheet for Healthcare Providers: SeriousBroker.it  This test is not yet approved or cleared by the [place] FDA and has been authorized for detection and/or diagnosis of SARS-CoV-2 by FDA under an Emergency Use Authorization (EUA). This EUA will remain in effect (meaning this test can be used) for the duration of the COVID-19 declaration under Section 564(b)(1) of the Act, 21 U.S.C. section 360bbb-3(b)(1), unless the authorization is terminated or revoked.     Resp Syncytial Virus by PCR NEGATIVE NEGATIVE Final    Comment: (NOTE) Fact Sheet for Patients: BloggerCourse.com  Fact Sheet for Healthcare Providers: SeriousBroker.it  This test is not yet approved or cleared by the [place] FDA and has been authorized for detection and/or diagnosis of SARS-CoV-2 by FDA under an Emergency Use Authorization (EUA). This EUA will remain in effect (meaning this test can be used) for the duration of the COVID-19 declaration under Section 564(b)(1) of the Act, 21 U.S.C. section 360bbb-3(b)(1), unless the authorization is terminated or revoked.  Performed at [facility], [address] [place], [place] [address]     Coagulation Studies: No results for input(s): "LABPROT", "INR" in the last 72 hours.  Urinalysis: No results for input(s): "COLORURINE", "LABSPEC", "PHURINE", "GLUCOSEU", "HGBUR", "BILIRUBINUR", "KETONESUR",  "PROTEINUR", "UROBILINOGEN", "NITRITE", "LEUKOCYTESUR" in the last 72 hours.  Invalid input(s): "APPERANCEUR"    Imaging: DG Chest Portable 1 View  Result Date: 11/12/2023 CLINICAL DATA:  77 year old female with history of shortness of breath and hypoxia. EXAM: PORTABLE CHEST 1 VIEW COMPARISON:  Chest x-ray 10/22/2023. FINDINGS: Lung volumes are normal. There is cephalization of the pulmonary vasculature, indistinctness of the interstitial markings, and patchy airspace disease throughout the lungs bilaterally suggestive of moderate pulmonary edema. Trace right and small left pleural effusions. No pneumothorax. Moderate cardiomegaly. Upper mediastinal contours are distorted by patient's rotation to the left. Atherosclerotic calcifications are noted in the thoracic aorta. IMPRESSION: 1. Overall, the appearance the chest favors congestive heart failure, as above. 2. Aortic atherosclerosis. Electronically Signed   By: Trudie Reed M.D.   On: 11/12/2023 06:34     Medications:      aspirin EC  81 mg Oral Daily   Chlorhexidine Gluconate Cloth  6 each Topical Q0600   cinacalcet  120 mg Oral Q supper   furosemide  80 mg Oral Once per day on Sunday Tuesday Thursday Saturday   heparin  5,000 Units Subcutaneous Q8H   hydrALAZINE  50 mg Oral Q8H   insulin aspart  0-6 Units Subcutaneous TID WC   pantoprazole  40 mg Oral Daily   rosuvastatin  5 mg Oral Once per day on Sunday Tuesday Thursday Saturday  sacubitril-valsartan  1 tablet Oral Q12H   senna-docusate  2 tablet Oral Daily   sevelamer carbonate  1,600 mg Oral TID WC   hydrALAZINE, ondansetron **OR** ondansetron (ZOFRAN) IV  Assessment/ Plan:  77 y.o. female with past medical history of ESRD on HD, hypertension, hyperlipidemia, CVA, macular degeneration who was admitted for acute respiratory failure requiring BiPAP.  [facility] nephrology/MWF/[facility]/left upper extremity AV fistula  1.  ESRD on HD.  Patient received dialysis  treatment yesterday, UF 1.5 L achieved.  No acute indication for dialysis today.  Next treatment scheduled for Wednesday.  Reeducated patient on sodium restriction.  2.  Acute respiratory failure/acute systolic and diastolic heart failure with ejection fraction of 35 to 40%..  Patient was able to wean from BiPAP after dialysis yesterday.  Currently on 4 L nasal cannula.  3.  Anemia of chronic kidney disease.   Lab Results  Component Value Date   HGB 8.8 (L) 11/13/2023  Hemoglobin reduced to 8.8.  Will monitor for need of ESA's.   4.  Secondary hyperparathyroidism.   Lab Results  Component Value Date   CALCIUM 8.8 (L) 11/13/2023   CAION 1.07 (L) 10/25/2023   PHOS 5.0 (H) 10/24/2023  Calcium and phosphorus within desired range.  Continue Renvela    LOS: 1 Modest Draeger 12/10/20241:41 PM

## 2023-11-13 NOTE — ED Notes (Signed)
This RN ambulated pt >167ft - her oxygen sats remained 92-93% through out walk. When we returned to the room and pt sat down in recliner her O2 was 89-91% with RR of 25-30. Pt was comfortable throughout walk without any complaints of SOB. After about 1 min at rest pt satting 93-96% with RR of 18. Provider aware.

## 2023-11-13 NOTE — ED Notes (Signed)
Assumed patient care. Received report from the previous nurse. Patient currently resting well.

## 2023-11-13 NOTE — ED Notes (Signed)
Pt weaned off oxygen and now is on room air. Satting between 90-92%. Provider made aware.

## 2023-11-13 NOTE — Discharge Planning (Signed)
 [facility]ODIAL[facility] Outpatient Facility  [facility]  [address]  [place] [address] [phone]  Schedule: MWF 5:30am  Confirmed patient is active and can resume schedule upon discharge.  Dimas Chyle Dialysis Coordinator II  [facility] Cell: [phone] eFax: [phone] [name]

## 2023-11-13 NOTE — ED Notes (Addendum)
Provider asked this RN to wean pt off oxygen. Currently on 2L [place] and satting at 98%. When this RN attempts to take pt off oxygen she desats to 88%. Will maintain on 2L [place] for now and keep trying to wean off.

## 2023-11-13 NOTE — ED Notes (Signed)
This tech notified Kristen Dike, RN about pt's CBG of 105 mg/dL

## 2023-11-13 NOTE — Discharge Summary (Signed)
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 female with medical history significant of ESRD on hemodialysis Monday Wednesday Friday, CAD, history of TIA, HFrEF presenting with volume overload, acute on chronic HFrEF.  Patient noted to come from dialysis center by EMS secondary to significant shortness of breath.  Patient reports worsening shortness of breath over the past 12 hours.  Noted to have been recently admitted November 18 through November 22 for similar issues.  Was formally diagnosed with HFrEF by echocardiogram during that admission in addition to pulmonary hypertension.  Also had left and right heart catheterization that was otherwise stable.  Was formally evaluated by cardiology.  Started on a course of Lasix on nondialysis days.  Has been compliant with regimen.  Patient does admit to high fluid intake including drinking a lot of [employer] lemonade.  No chest pain.  No abdominal pain.  No fevers or chills.  No reported tobacco abuse.  Has been compliant with medication regimen including diuretic BP medications. Presented to the ER afebrile, hemodynamically stable.  Respirations into the 30s.  Satting mid 40s on room air.  Patient required BiPAP.  White count 7.3, hemoglobin 10.7, platelets 207, VBG stable, COVID flu and RSV negative, creatinine 7.9,  glucose 157.  Chest x-ray with CHF pattern. Patient was later transitioned to intranasal oxygen.  Was seen by nephrologist and underwent hemodialysis with improvement.  Patient was then weaned off oxygen and underwent a walk test that showed patient does not meet criteria for home oxygen and therefore being discharged today to follow-up with nephrology, cardiology as well as primary care physician  Consultants: Nephrology Procedures performed: Hemodialysis Disposition: Home Diet recommendation:  Renal diet DISCHARGE MEDICATION: Allergies as of 11/13/2023       Reactions   Other Other (See Comments)   Pt does not receive Blood    Penicillins Hives   Has patient had a PCN reaction causing immediate rash, facial/tongue/throat swelling, SOB or lightheadedness with hypotension: YES Has patient had a PCN reaction causing severe rash involving mucus membranes or skin necrosis: no Has patient had a PCN reaction that required hospitalization: no Has patient had a PCN reaction occurring within the last 10 years: no If all of the above answers are "NO", then may proceed with Cephalosporin use.   Sodium Bicarbonate Itching        Medication List     STOP taking these medications    losartan 100 MG tablet Commonly known as: COZAAR       TAKE these medications    aspirin EC 81 MG tablet Take 1 tablet (81 mg total) by mouth daily. Swallow whole.   benzonatate 100 MG capsule Commonly known as: TESSALON Take 1 capsule (100 mg total) by mouth 2 (two) times daily as needed for cough.   carvedilol 25 MG tablet Commonly known as: COREG TAKE 1 TABLET(25 MG) BY MOUTH TWICE DAILY WITH A MEAL   cholecalciferol 1000 units  tablet Commonly known as: VITAMIN D Take 1,000 Units by mouth daily.   cinacalcet 60 MG tablet Commonly known as: SENSIPAR Take 120 mg by mouth daily with supper.   dronabinol 5 MG capsule Commonly known as: MARINOL Take 1 capsule by mouth before meals twice daily  on non-dialysis days   ferrous sulfate 325 (65 FE) MG tablet Take 1 tablet (325 mg total) by mouth 2 (two) times daily with a meal.   furosemide 80 MG tablet Commonly known as: LASIX Take 1 tablet (80 mg total) by mouth 4 (four) times a week. On non-dialysis days   hydrALAZINE 50 MG tablet Commonly known as: APRESOLINE TAKE 1 TABLET(50 MG) BY MOUTH THREE TIMES DAILY.   lidocaine-prilocaine cream Commonly known as: EMLA Apply 1 Application topically as directed.   omeprazole 40 MG capsule Commonly known as: PRILOSEC TAKE 1 CAPSULE(40 MG) BY MOUTH DAILY   Renal-Vite 0.8 MG Tabs Take 0.8 mg by mouth every evening. On dialysis days   rosuvastatin 5 MG tablet Commonly known as: CRESTOR Take 5 mg by mouth See admin instructions. (Take on non-dialysis days)   sacubitril-valsartan 49-51 MG Commonly known as: ENTRESTO Take 1 tablet by mouth every 12 (twelve) hours.   sennosides-docusate sodium 8.6-50 MG tablet Commonly known as: SENOKOT-S Take 2 tablets by mouth daily.   sevelamer carbonate 800 MG tablet Commonly known as: RENVELA Take 1,600 mg by mouth 3 (three) times daily with meals.        Discharge Exam: Filed Weights   11/12/23 1658 11/12/23 2023  Weight: 51.2 kg 47 kg   Constitutional:      Appearance: She is normal weight.  HENT:     Head: Normocephalic and atraumatic.     Nose: Nose normal.  Eyes:     Pupils: Pupils are equal, round, and reactive to light.  Cardiovascular:     Rate and Rhythm: Normal rate and regular rhythm.  Pulmonary:     Effort: Pulmonary effort is normal.  Abdominal:     General: Bowel sounds are normal.  Musculoskeletal:     Cervical back: Normal range of motion.  Skin:    General: Skin is warm.  Neurological:     General: No focal deficit present.  Psychiatric:        Mood and Affect: Mood normal.   Condition at discharge: good  The results of significant diagnostics from this hospitalization (including imaging,  microbiology, ancillary and laboratory) are listed below for reference.   Imaging Studies: VAS US CAROTID  Result Date: 11/13/2023 Carotid Arterial Duplex Study Patient Name:  Kristen Francis [facility]  Date of Exam:   11/06/2023 Medical Rec #: [id number]             Accession #:    [id number] Date of Birth: Dec 02, 1946             Patient Gender: F Patient Age:   89 years Exam Location:  [facility] Procedure:      VAS US CAROTID Referring Phys: Levora Dredge --------------------------------------------------------------------------------  Indications:  CVA and Carotid artery disease. Risk Factors: Hypertension, Diabetes, prior CVA. Performing Technologist: Hardie Lora RVT  Examination Guidelines: A complete evaluation includes B-mode imaging, spectral Doppler, color Doppler, and power Doppler as needed of all accessible portions of each vessel. Bilateral testing is considered an integral part of a complete examination. Limited examinations for reoccurring indications may be performed as noted.  Right Carotid Findings: +----------+--------+--------+--------+-------------------+--------+  PSV cm/sEDV cm/sStenosisPlaque Description Comments +----------+--------+--------+--------+-------------------+--------+ CCA Prox  62      0                                           +----------+--------+--------+--------+-------------------+--------+ CCA Mid   60      0                                           +----------+--------+--------+--------+-------------------+--------+ CCA Distal45                                                  +----------+--------+--------+--------+-------------------+--------+ ICA Prox  59      12      1-39%   calcific and smooth         +----------+--------+--------+--------+-------------------+--------+ ICA Mid   60      16                                          +----------+--------+--------+--------+-------------------+--------+  ICA Distal62      13                                          +----------+--------+--------+--------+-------------------+--------+ ECA       83      0                                           +----------+--------+--------+--------+-------------------+--------+ +----------+--------+-------+----------------+-------------------+           PSV cm/sEDV cmsDescribe        Arm Pressure (mmHG) +----------+--------+-------+----------------+-------------------+ [id number]      0      Multiphasic, WNL                    +----------+--------+-------+----------------+-------------------+ +---------+--------+--+--------+-+---------+ VertebralPSV cm/s44EDV cm/s8Antegrade +---------+--------+--+--------+-+---------+ Multiple thyroid nodules seen Left Carotid Findings: +----------+--------+--------+--------+----------------------+--------+           PSV cm/sEDV cm/sStenosisPlaque Description    Comments +----------+--------+--------+--------+----------------------+--------+ CCA Prox  69      14                                             +----------+--------+--------+--------+----------------------+--------+ CCA Mid   67      10                                             +----------+--------+--------+--------+----------------------+--------+ CCA Distal43                      irregular and calcific         +----------+--------+--------+--------+----------------------+--------+ ICA Prox  61  14      1-39%                                  +----------+--------+--------+--------+----------------------+--------+ ICA Mid   85      27                                             +----------+--------+--------+--------+----------------------+--------+ ICA Distal89      27                                             +----------+--------+--------+--------+----------------------+--------+ ECA       60      0                                               +----------+--------+--------+--------+----------------------+--------+ +----------+--------+--------+---------------------+-------------------+           PSV cm/sEDV cm/sDescribe             Arm Pressure (mmHG) +----------+--------+--------+---------------------+-------------------+ Subclavian130     42      Low resistance (AVGG)                    +----------+--------+--------+---------------------+-------------------+ +---------+--------+--+--------+--+ VertebralPSV cm/s38EDV cm/s13 +---------+--------+--+--------+--+ Multiple thyroid nodules seen  Summary: Right Carotid: Velocities in the right ICA are consistent with a 1-39% stenosis. Left Carotid: Velocities in the left ICA are consistent with a 1-39% stenosis. Vertebrals:  Bilateral vertebral arteries demonstrate antegrade flow. Subclavians: Normal flow hemodynamics were seen in bilateral subclavian              arteries. *See table(s) above for measurements and observations.  Electronically signed by Levora Dredge MD on 11/13/2023 at 9:32:56 AM.    Final    VAS US DUPLEX DIALYSIS ACCESS (AVF, AVG)  Result Date: 11/13/2023 DIALYSIS ACCESS Patient Name:  SAMARRAH VANHYNING [facility]  Date of Exam:   11/06/2023 Medical Rec #: [id number]             Accession #:    [id number] Date of Birth: 12/11/1945             Patient Gender: F Patient Age:   24 years Exam Location:  [facility] Procedure:      VAS US DUPLEX DIALYSIS ACCESS (AVF, AVG) Referring Phys: Levora Dredge --------------------------------------------------------------------------------  Reason for Exam: Routine follow up. Access Site: Left Upper Extremity. Access Type: Upper arm straight graft. History: 10/09/2023 PTA multiple location AVGG. Performing Technologist: Hardie Lora RVT  Examination Guidelines: A complete evaluation includes B-mode imaging, spectral Doppler, color Doppler, and power Doppler as needed of all accessible portions of each vessel. Unilateral  testing is considered an integral part of a complete examination. Limited examinations for reoccurring indications may be performed as noted.  Findings:   +--------------------+----------+-----------------+--------+ AVG                 PSV (cm/s)Flow Vol (mL/min)Describe +--------------------+----------+-----------------+--------+ Native artery inflow   113          2188                +--------------------+----------+-----------------+--------+  Arterial anastomosis   454                              +--------------------+----------+-----------------+--------+ Prox graft             483                              +--------------------+----------+-----------------+--------+ Mid graft              290                              +--------------------+----------+-----------------+--------+ Distal graft           185                              +--------------------+----------+-----------------+--------+ Venous anastomosis     237                              +--------------------+----------+-----------------+--------+ Venous outflow         179                              +--------------------+----------+-----------------+--------+ +------------------+-------------+---------+---------+----------+--------------+                   Diameter (cm)  Depth  BranchingPSV (cm/s) Flow Volume                                    (cm)                         (ml/min)    +------------------+-------------+---------+---------+----------+--------------+ Proximal left                                        87                   radial artery                                                             +------------------+-------------+---------+---------+----------+--------------+  Summary: Patent arteriovenous graft. *See table(s) above for measurements and observations.  Diagnosing physician: Levora Dredge MD Electronically signed by Levora Dredge MD on  11/13/2023 at 9:32:49 AM.   --------------------------------------------------------------------------------   Final    DG Chest Portable 1 View  Result Date: 11/12/2023 CLINICAL DATA:  77 year old female with history of shortness of breath and hypoxia. EXAM: PORTABLE CHEST 1 VIEW COMPARISON:  Chest x-ray 10/22/2023. FINDINGS: Lung volumes are normal. There is cephalization of the pulmonary vasculature, indistinctness of the interstitial markings, and patchy airspace disease throughout the lungs bilaterally suggestive of moderate pulmonary edema. Trace right and small left pleural effusions. No pneumothorax. Moderate cardiomegaly. Upper mediastinal contours are distorted by patient's rotation to the left. Atherosclerotic calcifications are noted in the thoracic aorta. IMPRESSION: 1. Overall, the appearance the  chest favors congestive heart failure, as above. 2. Aortic atherosclerosis. Electronically Signed   By: Trudie Reed M.D.   On: 11/12/2023 06:34   CARDIAC CATHETERIZATION  Result Date: 10/25/2023   Hemodynamic findings consistent with mild pulmonary hypertension. 1.  Mild pulmonary hypertension (PA mean 28 mmHg) 2.  Normal coronary anatomy Recommendations 1.  Good medical management   ECHOCARDIOGRAM COMPLETE  Result Date: 10/22/2023    ECHOCARDIOGRAM REPORT   Patient Name:   FUMI RAMISCAL Baig Date of Exam: 10/22/2023 Medical Rec #:  [id number]            Height:       63.0 in Accession #:    [id number]           Weight:       114.8 lb Date of Birth:  1946/04/22            BSA:          1.527 m Patient Age:    77 years             BP:           155/62 mmHg Patient Gender: F                    HR:           79 bpm. Exam Location:  [facility] Procedure: 2D Echo, Cardiac Doppler, Color Doppler, 3D Echo and Strain Analysis Indications:     Acute respiratory distress  History:         Patient has no prior history of Echocardiogram examinations.                  Stroke and TIA, Signs/Symptoms:Fatigue  and Shortness of Breath;                  Risk Factors:Hypertension and Diabetes. CKD.  Sonographer:     Mikki Harbor Referring Phys:  [phone] Francoise Schaumann [place] Diagnosing Phys: Lorine Bears MD  Sonographer Comments: Global longitudinal strain was attempted. IMPRESSIONS  1. Left ventricular ejection fraction, by estimation, is 35 to 40%. The left ventricle has moderately decreased function. The left ventricle demonstrates regional wall motion abnormalities (see scoring diagram/findings for description). The left ventricular internal cavity size was moderately dilated. There is moderate left ventricular hypertrophy. Left ventricular diastolic parameters are consistent with Grade II diastolic dysfunction (pseudonormalization). There is severe hypokinesis of the left ventricular, inferior wall and inferolateral wall. The average left ventricular global longitudinal strain is -13.2 %. The global longitudinal strain is abnormal.  2. Right ventricular systolic function is normal. The right ventricular size is normal. There is severely elevated pulmonary artery systolic pressure. The estimated right ventricular systolic pressure is 94.5 mmHg.  3. Left atrial size was severely dilated.  4. Right atrial size was mildly dilated.  5. Moderate pericardial effusion. The pericardial effusion is circumferential.  6. The mitral valve is normal in structure. Moderate to severe mitral valve regurgitation. No evidence of mitral stenosis. Moderate mitral annular calcification.  7. The aortic valve is normal in structure. Aortic valve regurgitation is moderate to severe. Aortic valve sclerosis/calcification is present, without any evidence of aortic stenosis.  8. Pulmonic valve regurgitation is moderate.  9. Severely dilated pulmonary artery. 10. The inferior vena cava is normal in size with <50% respiratory variability, suggesting right atrial pressure of 8 mmHg. FINDINGS  Left Ventricle: Left ventricular ejection fraction, by  estimation, is 35 to 40%. The left ventricle has moderately  decreased function. The left ventricle demonstrates regional wall motion abnormalities. The average left ventricular global longitudinal strain is -13.2 %. The global longitudinal strain is abnormal. The left ventricular internal cavity size was moderately dilated. There is moderate left ventricular hypertrophy. Left ventricular diastolic parameters are consistent with Grade II diastolic dysfunction (pseudonormalization). Right Ventricle: The right ventricular size is normal. No increase in right ventricular wall thickness. Right ventricular systolic function is normal. There is severely elevated pulmonary artery systolic pressure. The tricuspid regurgitant velocity is 4.65 m/s, and with an assumed right atrial pressure of 8 mmHg, the estimated right ventricular systolic pressure is 94.5 mmHg. Left Atrium: Left atrial size was severely dilated. Right Atrium: Right atrial size was mildly dilated. Pericardium: A moderately sized pericardial effusion is present. The pericardial effusion is circumferential. Mitral Valve: The mitral valve is normal in structure. There is moderate thickening of the mitral valve leaflet(s). There is mild calcification of the mitral valve leaflet(s). Moderate mitral annular calcification. Moderate to severe mitral valve regurgitation. No evidence of mitral valve stenosis. MV peak gradient, 3.0 mmHg. The mean mitral valve gradient is 1.0 mmHg. Tricuspid Valve: The tricuspid valve is normal in structure. Tricuspid valve regurgitation is mild . No evidence of tricuspid stenosis. Aortic Valve: The aortic valve is normal in structure. Aortic valve regurgitation is moderate to severe. Aortic regurgitation PHT measures 519 msec. Aortic valve sclerosis/calcification is present, without any evidence of aortic stenosis. Aortic valve mean gradient measures 6.0 mmHg. Aortic valve peak gradient measures 14.4 mmHg. Aortic valve area, by VTI  measures 1.95 cm. Pulmonic Valve: The pulmonic valve was normal in structure. Pulmonic valve regurgitation is moderate. No evidence of pulmonic stenosis. Aorta: The aortic root is normal in size and structure. Pulmonary Artery: The pulmonary artery is severely dilated. Venous: The inferior vena cava is normal in size with less than 50% respiratory variability, suggesting right atrial pressure of 8 mmHg. IAS/Shunts: No atrial level shunt detected by color flow Doppler. Additional Comments: There is a small pleural effusion in the left lateral region.  LEFT VENTRICLE PLAX 2D LVIDd:         6.00 cm      Diastology LVIDs:         4.90 cm      LV e' medial:    4.13 cm/s LV PW:         1.30 cm      LV E/e' medial:  20.2 LV IVS:        1.40 cm      LV e' lateral:   5.44 cm/s LVOT diam:     1.90 cm      LV E/e' lateral: 15.3 LV SV:         79 LV SV Index:   52           2D Longitudinal Strain LVOT Area:     2.84 cm     2D Strain GLS Avg:     -13.2 %  LV Volumes (MOD) LV vol d, MOD A2C: 142.0 ml LV vol d, MOD A4C: 115.0 ml LV vol s, MOD A2C: 62.8 ml LV vol s, MOD A4C: 70.5 ml LV SV MOD A2C:     79.2 ml LV SV MOD A4C:     115.0 ml LV SV MOD BP:      61.9 ml RIGHT VENTRICLE RV Basal diam:  3.90 cm RV Mid diam:    3.30 cm RV S prime:     14.80 cm/s TAPSE (  M-mode): 1.9 cm LEFT ATRIUM              Index        RIGHT ATRIUM           Index LA diam:        4.80 cm  3.14 cm/m   RA Area:     18.90 cm LA Vol (A2C):   144.0 ml 94.29 ml/m  RA Volume:   55.80 ml  36.54 ml/m LA Vol (A4C):   121.0 ml 79.23 ml/m LA Biplane Vol: 138.0 ml 90.36 ml/m  AORTIC VALVE                     PULMONIC VALVE AV Area (Vmax):    2.10 cm      PV Vmax:       1.14 m/s AV Area (Vmean):   1.91 cm      PV Peak grad:  5.2 mmHg AV Area (VTI):     1.95 cm AV Vmax:           190.00 cm/s AV Vmean:          115.000 cm/s AV VTI:            0.407 m AV Peak Grad:      14.4 mmHg AV Mean Grad:      6.0 mmHg LVOT Vmax:         141.00 cm/s LVOT Vmean:         77.300 cm/s LVOT VTI:          0.280 m LVOT/AV VTI ratio: 0.69 AI PHT:            519 msec  AORTA Ao Root diam: 3.60 cm Ao Asc diam:  3.40 cm MITRAL VALVE                  TRICUSPID VALVE MV Area (PHT): 3.70 cm       TR Peak grad:   86.5 mmHg MV Area VTI:   3.36 cm       TR Vmax:        465.00 cm/s MV Peak grad:  3.0 mmHg MV Mean grad:  1.0 mmHg       SHUNTS MV Vmax:       0.87 m/s       Systemic VTI:  0.28 m MV Vmean:      56.8 cm/s      Systemic Diam: 1.90 cm MV Decel Time: 205 msec MR Peak grad:    138.3 mmHg MR Mean grad:    92.0 mmHg MR Vmax:         588.00 cm/s MR Vmean:        457.0 cm/s MR PISA:         2.26 cm MR PISA Eff ROA: 36 mm MR PISA Radius:  0.60 cm MV E velocity: 83.30 cm/s MV A velocity: 93.30 cm/s MV E/A ratio:  0.89 Lorine Bears MD Electronically signed by Lorine Bears MD Signature Date/Time: 10/22/2023/5:06:31 PM    Final    DG Chest Portable 1 View  Result Date: 10/22/2023 CLINICAL DATA:  Respiratory distress. EXAM: PORTABLE CHEST 1 VIEW COMPARISON:  08/03/2023 FINDINGS: Cardiac enlargement. Aortic atherosclerosis. Small left pleural effusion is suspected. Diffuse increase interstitial markings identified bilaterally with bilateral patchy airspace opacities. There is advanced degenerative joint disease involving the glenohumeral and acromioclavicular joints. IMPRESSION: Cardiac enlargement, small left pleural effusion, increased interstitial markings and bilateral patchy airspace opacities compatible  with moderate pulmonary edema. Electronically Signed   By: Signa Kell M.D.   On: 10/22/2023 07:00   MM 3D SCREENING MAMMOGRAM BILATERAL BREAST  Result Date: 10/19/2023 CLINICAL DATA:  Screening. EXAM: DIGITAL SCREENING BILATERAL MAMMOGRAM WITH TOMOSYNTHESIS AND CAD TECHNIQUE: Bilateral screening digital craniocaudal and mediolateral oblique mammograms were obtained. Bilateral screening digital breast tomosynthesis was performed. The images were evaluated with computer-aided  detection. COMPARISON:  Previous exam(s). ACR Breast Density Category c: The breasts are heterogeneously dense, which may obscure small masses. FINDINGS: There are no findings suspicious for malignancy. IMPRESSION: No mammographic evidence of malignancy. A result letter of this screening mammogram will be mailed directly to the patient. RECOMMENDATION: Screening mammogram in one year. (Code:SM-B-01Y) BI-RADS CATEGORY  1: Negative. Electronically Signed   By: Elberta Fortis M.D.   On: 10/19/2023 10:33    Microbiology: Results for orders placed or performed during the hospital encounter of 11/12/23  Resp panel by RT-PCR (RSV, Flu A&B, Covid) Anterior Nasal Swab     Status: None   Collection Time: 11/12/23  6:00 AM   Specimen: Anterior Nasal Swab  Result Value Ref Range Status   SARS Coronavirus 2 by RT PCR NEGATIVE NEGATIVE Final    Comment: (NOTE) SARS-CoV-2 target nucleic acids are NOT DETECTED.  The SARS-CoV-2 RNA is generally detectable in upper respiratory specimens during the acute phase of infection. The lowest concentration of SARS-CoV-2 viral copies this assay can detect is 138 copies/mL. A negative result does not preclude SARS-Cov-2 infection and should not be used as the sole basis for treatment or other patient management decisions. A negative result may occur with  improper specimen collection/handling, submission of specimen other than nasopharyngeal swab, presence of viral mutation(s) within the areas targeted by this assay, and inadequate number of viral copies(<138 copies/mL). A negative result must be combined with clinical observations, patient history, and epidemiological information. The expected result is Negative.  Fact Sheet for Patients:  BloggerCourse.com  Fact Sheet for Healthcare Providers:  SeriousBroker.it  This test is no t yet approved or cleared by the [place] FDA and  has been authorized for  detection and/or diagnosis of SARS-CoV-2 by FDA under an Emergency Use Authorization (EUA). This EUA will remain  in effect (meaning this test can be used) for the duration of the COVID-19 declaration under Section 564(b)(1) of the Act, 21 U.S.C.section 360bbb-3(b)(1), unless the authorization is terminated  or revoked sooner.       Influenza A by PCR NEGATIVE NEGATIVE Final   Influenza B by PCR NEGATIVE NEGATIVE Final    Comment: (NOTE) The Xpert Xpress SARS-CoV-2/FLU/RSV plus assay is intended as an aid in the diagnosis of influenza from Nasopharyngeal swab specimens and should not be used as a sole basis for treatment. Nasal washings and aspirates are unacceptable for Xpert Xpress SARS-CoV-2/FLU/RSV testing.  Fact Sheet for Patients: BloggerCourse.com  Fact Sheet for Healthcare Providers: SeriousBroker.it  This test is not yet approved or cleared by the [place] FDA and has been authorized for detection and/or diagnosis of SARS-CoV-2 by FDA under an Emergency Use Authorization (EUA). This EUA will remain in effect (meaning this test can be used) for the duration of the COVID-19 declaration under Section 564(b)(1) of the Act, 21 U.S.C. section 360bbb-3(b)(1), unless the authorization is terminated or revoked.     Resp Syncytial Virus by PCR NEGATIVE NEGATIVE Final    Comment: (NOTE) Fact Sheet for Patients: BloggerCourse.com  Fact Sheet for Healthcare Providers: SeriousBroker.it  This test is not  yet approved or cleared by the [place] and has been authorized for detection and/or diagnosis of SARS-CoV-2 by FDA under an Emergency Use Authorization (EUA). This EUA will remain in effect (meaning this test can be used) for the duration of the COVID-19 declaration under Section 564(b)(1) of the Act, 21 U.S.C. section 360bbb-3(b)(1), unless the authorization is  terminated or revoked.  Performed at [facility], [address] [place], [place] [address]     Labs: CBC: Recent Labs  Lab 11/12/23 0559 11/13/23 0518  WBC 7.3 6.0  NEUTROABS 6.3  --   HGB 10.7* 8.8*  HCT 34.7* 28.3*  MCV 90.4 89.8  PLT 207 155   Basic Metabolic Panel: Recent Labs  Lab 11/12/23 0559 11/13/23 0518  NA 142 137  K 5.0 4.3  CL 100 98  CO2 28 29  GLUCOSE 157* 82  BUN 69* 39*  CREATININE 7.87* 5.21*  CALCIUM 9.0 8.8*   Liver Function Tests: Recent Labs  Lab 11/13/23 0518  AST 17  ALT 13  ALKPHOS 33*  BILITOT 0.9  PROT 6.0*  ALBUMIN 3.2*   CBG: Recent Labs  Lab 11/12/23 0825 11/12/23 1245 11/13/23 0722 11/13/23 1138 11/13/23 1657  GLUCAP 89 76 137* 89 105*    Discharge time spent:  38 minutes.  Signed: Loyce Dys, MD [facility] 11/13/2023

## 2023-11-14 DIAGNOSIS — D689 Coagulation defect, unspecified: Secondary | ICD-10-CM | POA: Diagnosis not present

## 2023-11-14 DIAGNOSIS — R52 Pain, unspecified: Secondary | ICD-10-CM | POA: Diagnosis not present

## 2023-11-14 DIAGNOSIS — D631 Anemia in chronic kidney disease: Secondary | ICD-10-CM | POA: Diagnosis not present

## 2023-11-14 DIAGNOSIS — N186 End stage renal disease: Secondary | ICD-10-CM | POA: Diagnosis not present

## 2023-11-14 DIAGNOSIS — N2581 Secondary hyperparathyroidism of renal origin: Secondary | ICD-10-CM | POA: Diagnosis not present

## 2023-11-14 DIAGNOSIS — Z992 Dependence on renal dialysis: Secondary | ICD-10-CM | POA: Diagnosis not present

## 2023-11-14 DIAGNOSIS — D509 Iron deficiency anemia, unspecified: Secondary | ICD-10-CM | POA: Diagnosis not present

## 2023-11-16 DIAGNOSIS — R52 Pain, unspecified: Secondary | ICD-10-CM | POA: Diagnosis not present

## 2023-11-16 DIAGNOSIS — D509 Iron deficiency anemia, unspecified: Secondary | ICD-10-CM | POA: Diagnosis not present

## 2023-11-16 DIAGNOSIS — D689 Coagulation defect, unspecified: Secondary | ICD-10-CM | POA: Diagnosis not present

## 2023-11-16 DIAGNOSIS — N2581 Secondary hyperparathyroidism of renal origin: Secondary | ICD-10-CM | POA: Diagnosis not present

## 2023-11-16 DIAGNOSIS — Z992 Dependence on renal dialysis: Secondary | ICD-10-CM | POA: Diagnosis not present

## 2023-11-16 DIAGNOSIS — N186 End stage renal disease: Secondary | ICD-10-CM | POA: Diagnosis not present

## 2023-11-16 DIAGNOSIS — D631 Anemia in chronic kidney disease: Secondary | ICD-10-CM | POA: Diagnosis not present

## 2023-11-19 ENCOUNTER — Telehealth: Payer: Self-pay | Admitting: Nurse Practitioner

## 2023-11-19 DIAGNOSIS — R52 Pain, unspecified: Secondary | ICD-10-CM | POA: Diagnosis not present

## 2023-11-19 DIAGNOSIS — D509 Iron deficiency anemia, unspecified: Secondary | ICD-10-CM | POA: Diagnosis not present

## 2023-11-19 DIAGNOSIS — N2581 Secondary hyperparathyroidism of renal origin: Secondary | ICD-10-CM | POA: Diagnosis not present

## 2023-11-19 DIAGNOSIS — N186 End stage renal disease: Secondary | ICD-10-CM | POA: Diagnosis not present

## 2023-11-19 DIAGNOSIS — Z992 Dependence on renal dialysis: Secondary | ICD-10-CM | POA: Diagnosis not present

## 2023-11-19 DIAGNOSIS — D689 Coagulation defect, unspecified: Secondary | ICD-10-CM | POA: Diagnosis not present

## 2023-11-19 DIAGNOSIS — D631 Anemia in chronic kidney disease: Secondary | ICD-10-CM | POA: Diagnosis not present

## 2023-11-19 NOTE — Telephone Encounter (Signed)
Patient lvm over weekend to r/s 11/20/23 appointment. No answer. Lvm to return call-Toni

## 2023-11-20 ENCOUNTER — Inpatient Hospital Stay: Payer: Medicare Other | Admitting: Nurse Practitioner

## 2023-11-21 DIAGNOSIS — N186 End stage renal disease: Secondary | ICD-10-CM | POA: Diagnosis not present

## 2023-11-21 DIAGNOSIS — Z992 Dependence on renal dialysis: Secondary | ICD-10-CM | POA: Diagnosis not present

## 2023-11-21 DIAGNOSIS — D509 Iron deficiency anemia, unspecified: Secondary | ICD-10-CM | POA: Diagnosis not present

## 2023-11-21 DIAGNOSIS — R52 Pain, unspecified: Secondary | ICD-10-CM | POA: Diagnosis not present

## 2023-11-21 DIAGNOSIS — D689 Coagulation defect, unspecified: Secondary | ICD-10-CM | POA: Diagnosis not present

## 2023-11-21 DIAGNOSIS — N2581 Secondary hyperparathyroidism of renal origin: Secondary | ICD-10-CM | POA: Diagnosis not present

## 2023-11-21 DIAGNOSIS — D631 Anemia in chronic kidney disease: Secondary | ICD-10-CM | POA: Diagnosis not present

## 2023-11-23 ENCOUNTER — Ambulatory Visit (INDEPENDENT_AMBULATORY_CARE_PROVIDER_SITE_OTHER): Payer: Medicare Other | Admitting: Physician Assistant

## 2023-11-23 ENCOUNTER — Encounter: Payer: Self-pay | Admitting: Physician Assistant

## 2023-11-23 VITALS — BP 122/55 | HR 72 | Temp 98.5°F | Resp 16 | Ht 63.0 in | Wt 109.0 lb

## 2023-11-23 DIAGNOSIS — D509 Iron deficiency anemia, unspecified: Secondary | ICD-10-CM | POA: Diagnosis not present

## 2023-11-23 DIAGNOSIS — Z09 Encounter for follow-up examination after completed treatment for conditions other than malignant neoplasm: Secondary | ICD-10-CM

## 2023-11-23 DIAGNOSIS — R0602 Shortness of breath: Secondary | ICD-10-CM

## 2023-11-23 DIAGNOSIS — I509 Heart failure, unspecified: Secondary | ICD-10-CM

## 2023-11-23 DIAGNOSIS — D689 Coagulation defect, unspecified: Secondary | ICD-10-CM | POA: Diagnosis not present

## 2023-11-23 DIAGNOSIS — N186 End stage renal disease: Secondary | ICD-10-CM

## 2023-11-23 DIAGNOSIS — Z992 Dependence on renal dialysis: Secondary | ICD-10-CM

## 2023-11-23 DIAGNOSIS — D631 Anemia in chronic kidney disease: Secondary | ICD-10-CM | POA: Diagnosis not present

## 2023-11-23 DIAGNOSIS — D649 Anemia, unspecified: Secondary | ICD-10-CM

## 2023-11-23 DIAGNOSIS — N2581 Secondary hyperparathyroidism of renal origin: Secondary | ICD-10-CM | POA: Diagnosis not present

## 2023-11-23 DIAGNOSIS — R52 Pain, unspecified: Secondary | ICD-10-CM | POA: Diagnosis not present

## 2023-11-23 NOTE — Progress Notes (Cosign Needed)
[facility] [address] [place], [place] [address]  Internal MEDICINE  Office Visit Note  Patient Name: Kristen Francis  [id number]  [id number]  Date of Service: 11/23/2023     Chief Complaint  Patient presents with   Hospitalization Follow-up    acute resp failure     HPI Pt is here for recent hospital follow up. -Pt went to ED on 11/12/23 and discharged 11/13/23 due to SOB -Per ED note, pt was hypoxemic with EMS in the 80s, on RA in ED was upper 40s, 85% on nonrebreather. -CXR ordered and found to show CHF exacerbation -She was put on bipap, labs looked ok overall. Underwent hemodialysis and seen by neohrology -She was weaned off oxygen and walked with nurse--stayed in low 90s off oxygen therefore did not require oxygen at discharge -had dialysis this morning and has been up since 3am so more tired today. Does state she has put on oxygen during dialysis since discharge, not ware of any desats during this--unsure why this was done. Pt denies SOB and sats good in office on RA. Will get walk to be sure oxygen still remaining ok -Breathing stable -will update CBC since hemoglobin did drop a little further and pt more fatigued today, does have chronic anemia secondary to ESRD and not far from previous labs. Does receive iron at dialysis -no changes in BM, no blood in urine -seeing cardiology next week  Current Medication: Outpatient Encounter Medications as of 11/23/2023  Medication Sig   aspirin EC 81 MG EC tablet Take 1 tablet (81 mg total) by mouth daily. Swallow whole.   B Complex-C-Folic Acid (RENAL-VITE) 0.8 MG TABS Take 0.8 mg by mouth every evening. On dialysis days   benzonatate (TESSALON) 100 MG capsule Take 1 capsule (100 mg total) by mouth 2 (two) times daily as needed for cough.   carvedilol (COREG) 25 MG tablet TAKE 1 TABLET(25 MG) BY MOUTH TWICE DAILY WITH A MEAL   cholecalciferol (VITAMIN D) 1000 units tablet Take 1,000 Units by mouth daily.     cinacalcet (SENSIPAR) 60 MG tablet Take 120 mg by mouth daily with supper.   dronabinol (MARINOL) 5 MG capsule Take 1 capsule by mouth before meals twice daily on non-dialysis days   ferrous sulfate 325 (65 FE) MG tablet Take 1 tablet (325 mg total) by mouth 2 (two) times daily with a meal.   furosemide (LASIX) 80 MG tablet Take 1 tablet (80 mg total) by mouth 4 (four) times a week. On non-dialysis days   hydrALAZINE (APRESOLINE) 50 MG tablet TAKE 1 TABLET(50 MG) BY MOUTH THREE TIMES DAILY.   lidocaine-prilocaine (EMLA) cream Apply 1 Application topically as directed.   omeprazole (PRILOSEC) 40 MG capsule TAKE 1 CAPSULE(40 MG) BY MOUTH DAILY   rosuvastatin (CRESTOR) 5 MG tablet Take 5 mg by mouth See admin instructions. (Take on non-dialysis days)   sacubitril-valsartan (ENTRESTO) 49-51 MG Take 1 tablet by mouth every 12 (twelve) hours.   sennosides-docusate sodium (SENOKOT-S) 8.6-50 MG tablet Take 2 tablets by mouth daily.   sevelamer carbonate (RENVELA) 800 MG tablet Take 1,600 mg by mouth 3 (three) times daily with meals.   No facility-administered encounter medications on file as of 11/23/2023.    Surgical History: Past Surgical History:  Procedure Laterality Date   A/V FISTULAGRAM Left 12/25/2018   Procedure: A/V FISTULAGRAM;  Surgeon: Renford Dills, MD;  Location: [facility] INVASIVE CV LAB;  Service: Cardiovascular;  Laterality: Left;   A/V FISTULAGRAM Left 10/09/2023  Procedure: A/V Fistulagram;  Surgeon: Renford Dills, MD;  Location: [facility] INVASIVE CV LAB;  Service: Cardiovascular;  Laterality: Left;   ABDOMINAL HYSTERECTOMY     APPENDECTOMY     AV FISTULA PLACEMENT Left 07/12/2018   Procedure: INSERTION OF ARTERIOVENOUS (AV) GORE-TEX GRAFT ARM ( BRACHIAL AXILLARY );  Surgeon: Renford Dills, MD;  Location: [facility] ORS;  Service: Vascular;  Laterality: Left;   COLONOSCOPY WITH PROPOFOL N/A 01/14/2016   Procedure: COLONOSCOPY WITH PROPOFOL;  Surgeon: Scot Jun, MD;   Location: [facility] ENDOSCOPY;  Service: Endoscopy;  Laterality: N/A;   COLONOSCOPY WITH PROPOFOL N/A 02/14/2022   Procedure: COLONOSCOPY WITH PROPOFOL;  Surgeon: Regis Bill, MD;  Location: [facility] ENDOSCOPY;  Service: Endoscopy;  Laterality: N/A;   RIGHT/LEFT HEART CATH AND CORONARY ANGIOGRAPHY N/A 10/25/2023   Procedure: RIGHT/LEFT HEART CATH AND CORONARY ANGIOGRAPHY;  Surgeon: Marcina Millard, MD;  Location: [facility] INVASIVE CV LAB;  Service: Cardiovascular;  Laterality: N/A;   TONSILLECTOMY      Medical History: Past Medical History:  Diagnosis Date   Anemia    Arthritis    Chronic kidney disease    ESRD   Complication of anesthesia    had procedure and felt incision early 80s   Gout    Hyperlipemia    Hypertension    Macular degeneration, right eye    Stroke (HCC)     Family History: Family History  Problem Relation Age of Onset   Kidney disease Mother    Breast cancer Neg Hx     Social History   Socioeconomic History   Marital status: Married    Spouse name: Not on file   Number of children: Not on file   Years of education: Not on file   Highest education level: Not on file  Occupational History   Not on file  Tobacco Use   Smoking status: Former   Smokeless tobacco: Never  Vaping Use   Vaping status: Never Used  Substance and Sexual Activity   Alcohol use: No   Drug use: No   Sexual activity: Not on file  Other Topics Concern   Not on file  Social History Narrative   Lives in  [place]; used to clean; no smoking; no alcohol.    Social Drivers of Corporate investment banker Strain: Not on file  Food Insecurity: No Food Insecurity (10/22/2023)   Hunger Vital Sign    Worried About Running Out of Food in the Last Year: Never true    Ran Out of Food in the Last Year: Never true  Transportation Needs: No Transportation Needs (10/22/2023)   PRAPARE - Administrator, Civil Service (Medical): No    Lack of Transportation (Non-Medical): No   Physical Activity: Not on file  Stress: Not on file  Social Connections: Not on file  Intimate Partner Violence: Not At Risk (10/22/2023)   Humiliation, Afraid, Rape, and Kick questionnaire    Fear of Current or Ex-Partner: No    Emotionally Abused: No    Physically Abused: No    Sexually Abused: No      Review of Systems  Constitutional:  Positive for fatigue. Negative for chills and unexpected weight change.  HENT:  Negative for congestion, rhinorrhea, sneezing and sore throat.   Eyes:  Negative for redness.  Respiratory:  Negative for cough, chest tightness and shortness of breath.   Cardiovascular:  Negative for chest pain and palpitations.  Gastrointestinal:  Negative for abdominal pain, constipation, diarrhea,  nausea and vomiting.  Genitourinary:  Negative for dysuria and frequency.  Musculoskeletal:  Negative for arthralgias, back pain, joint swelling and neck pain.  Skin:  Negative for rash.  Neurological: Negative.  Negative for tremors and numbness.  Hematological:  Negative for adenopathy. Does not bruise/bleed easily.  Psychiatric/Behavioral:  Negative for behavioral problems (Depression), sleep disturbance and suicidal ideas. The patient is not nervous/anxious.     Vital Signs: BP (!) 122/55   Pulse 72   Temp 98.5 F (36.9 C)   Resp 16   Ht 5\' 3"  (1.6 m)   Wt 109 lb (49.4 kg)   SpO2 97%   BMI 19.31 kg/m    Physical Exam Vitals reviewed.  Constitutional:      General: She is not in acute distress.    Appearance: Normal appearance. She is normal weight. She is not ill-appearing.  HENT:     Head: Normocephalic and atraumatic.  Eyes:     Pupils: Pupils are equal, round, and reactive to light.  Cardiovascular:     Rate and Rhythm: Normal rate and regular rhythm.  Pulmonary:     Effort: Pulmonary effort is normal. No respiratory distress.  Neurological:     Mental Status: She is alert and oriented to person, place, and time.  Psychiatric:        Mood  and Affect: Mood normal.        Behavior: Behavior normal.       Assessment/Plan: 1. Hospital discharge follow-up (Primary) Reviewed hospital course  2. Acute on chronic congestive heart failure, unspecified heart failure type (HCC) SOB resolved and saturations stable, will follow up with cardiology next week as planned  3. Low hemoglobin Will recheck CBC for monitoring - CBC w/Diff/Platelet  4. SOB (shortness of breath) - 6 minute walk normal, does not require home O2  5. ESRD (end stage renal disease) on dialysis [facility]) Followed by nephrology   General Counseling: Lorilyn verbalizes understanding of the findings of todays visit and agrees with plan of treatment. I have discussed any further diagnostic evaluation that may be needed or ordered today. We also reviewed her medications today. she has been encouraged to call the office with any questions or concerns that should arise related to todays visit.    Counseling:    Orders Placed This Encounter  Procedures   CBC w/Diff/Platelet   6 minute walk    This patient was seen by Lynn Ito, PA-C in collaboration with Dr. Beverely Risen as a part of collaborative care agreement.   I have reviewed all medical records from hospital follow up including radiology reports and consults from other physicians. Appropriate follow up diagnostics will be scheduled as needed. Patient/ Family understands the plan of treatment. Time spent 35 minutes.   Dr Lyndon Code, MD Internal Medicine

## 2023-11-26 DIAGNOSIS — I3139 Other pericardial effusion (noninflammatory): Secondary | ICD-10-CM | POA: Diagnosis not present

## 2023-11-26 DIAGNOSIS — N2581 Secondary hyperparathyroidism of renal origin: Secondary | ICD-10-CM | POA: Diagnosis not present

## 2023-11-26 DIAGNOSIS — Z992 Dependence on renal dialysis: Secondary | ICD-10-CM | POA: Diagnosis not present

## 2023-11-26 DIAGNOSIS — R52 Pain, unspecified: Secondary | ICD-10-CM | POA: Diagnosis not present

## 2023-11-26 DIAGNOSIS — I340 Nonrheumatic mitral (valve) insufficiency: Secondary | ICD-10-CM | POA: Diagnosis not present

## 2023-11-26 DIAGNOSIS — I272 Pulmonary hypertension, unspecified: Secondary | ICD-10-CM | POA: Diagnosis not present

## 2023-11-26 DIAGNOSIS — D631 Anemia in chronic kidney disease: Secondary | ICD-10-CM | POA: Diagnosis not present

## 2023-11-26 DIAGNOSIS — I5020 Unspecified systolic (congestive) heart failure: Secondary | ICD-10-CM | POA: Diagnosis not present

## 2023-11-26 DIAGNOSIS — D509 Iron deficiency anemia, unspecified: Secondary | ICD-10-CM | POA: Diagnosis not present

## 2023-11-26 DIAGNOSIS — I351 Nonrheumatic aortic (valve) insufficiency: Secondary | ICD-10-CM | POA: Diagnosis not present

## 2023-11-26 DIAGNOSIS — D689 Coagulation defect, unspecified: Secondary | ICD-10-CM | POA: Diagnosis not present

## 2023-11-26 DIAGNOSIS — N186 End stage renal disease: Secondary | ICD-10-CM | POA: Diagnosis not present

## 2023-11-27 DIAGNOSIS — Z992 Dependence on renal dialysis: Secondary | ICD-10-CM | POA: Diagnosis not present

## 2023-11-27 DIAGNOSIS — N2581 Secondary hyperparathyroidism of renal origin: Secondary | ICD-10-CM | POA: Diagnosis not present

## 2023-11-27 DIAGNOSIS — D509 Iron deficiency anemia, unspecified: Secondary | ICD-10-CM | POA: Diagnosis not present

## 2023-11-27 DIAGNOSIS — N186 End stage renal disease: Secondary | ICD-10-CM | POA: Diagnosis not present

## 2023-11-27 DIAGNOSIS — R52 Pain, unspecified: Secondary | ICD-10-CM | POA: Diagnosis not present

## 2023-11-27 DIAGNOSIS — D689 Coagulation defect, unspecified: Secondary | ICD-10-CM | POA: Diagnosis not present

## 2023-11-27 DIAGNOSIS — D631 Anemia in chronic kidney disease: Secondary | ICD-10-CM | POA: Diagnosis not present

## 2023-11-30 DIAGNOSIS — N2581 Secondary hyperparathyroidism of renal origin: Secondary | ICD-10-CM | POA: Diagnosis not present

## 2023-11-30 DIAGNOSIS — Z992 Dependence on renal dialysis: Secondary | ICD-10-CM | POA: Diagnosis not present

## 2023-11-30 DIAGNOSIS — D509 Iron deficiency anemia, unspecified: Secondary | ICD-10-CM | POA: Diagnosis not present

## 2023-11-30 DIAGNOSIS — N186 End stage renal disease: Secondary | ICD-10-CM | POA: Diagnosis not present

## 2023-11-30 DIAGNOSIS — R52 Pain, unspecified: Secondary | ICD-10-CM | POA: Diagnosis not present

## 2023-11-30 DIAGNOSIS — D689 Coagulation defect, unspecified: Secondary | ICD-10-CM | POA: Diagnosis not present

## 2023-11-30 DIAGNOSIS — D631 Anemia in chronic kidney disease: Secondary | ICD-10-CM | POA: Diagnosis not present

## 2023-12-02 DIAGNOSIS — R52 Pain, unspecified: Secondary | ICD-10-CM | POA: Diagnosis not present

## 2023-12-02 DIAGNOSIS — D509 Iron deficiency anemia, unspecified: Secondary | ICD-10-CM | POA: Diagnosis not present

## 2023-12-02 DIAGNOSIS — N186 End stage renal disease: Secondary | ICD-10-CM | POA: Diagnosis not present

## 2023-12-02 DIAGNOSIS — N2581 Secondary hyperparathyroidism of renal origin: Secondary | ICD-10-CM | POA: Diagnosis not present

## 2023-12-02 DIAGNOSIS — Z992 Dependence on renal dialysis: Secondary | ICD-10-CM | POA: Diagnosis not present

## 2023-12-02 DIAGNOSIS — D631 Anemia in chronic kidney disease: Secondary | ICD-10-CM | POA: Diagnosis not present

## 2023-12-02 DIAGNOSIS — D689 Coagulation defect, unspecified: Secondary | ICD-10-CM | POA: Diagnosis not present

## 2023-12-03 DIAGNOSIS — D649 Anemia, unspecified: Secondary | ICD-10-CM | POA: Diagnosis not present

## 2023-12-04 DIAGNOSIS — R52 Pain, unspecified: Secondary | ICD-10-CM | POA: Diagnosis not present

## 2023-12-04 DIAGNOSIS — N040 Nephrotic syndrome with minor glomerular abnormality: Secondary | ICD-10-CM | POA: Diagnosis not present

## 2023-12-04 DIAGNOSIS — Z992 Dependence on renal dialysis: Secondary | ICD-10-CM | POA: Diagnosis not present

## 2023-12-04 DIAGNOSIS — N2581 Secondary hyperparathyroidism of renal origin: Secondary | ICD-10-CM | POA: Diagnosis not present

## 2023-12-04 DIAGNOSIS — D689 Coagulation defect, unspecified: Secondary | ICD-10-CM | POA: Diagnosis not present

## 2023-12-04 DIAGNOSIS — D631 Anemia in chronic kidney disease: Secondary | ICD-10-CM | POA: Diagnosis not present

## 2023-12-04 DIAGNOSIS — D509 Iron deficiency anemia, unspecified: Secondary | ICD-10-CM | POA: Diagnosis not present

## 2023-12-04 DIAGNOSIS — N186 End stage renal disease: Secondary | ICD-10-CM | POA: Diagnosis not present

## 2023-12-04 LAB — CBC WITH DIFFERENTIAL/PLATELET
Basophils Absolute: 0.1 10*3/uL (ref 0.0–0.2)
Basos: 2 %
EOS (ABSOLUTE): 0.1 10*3/uL (ref 0.0–0.4)
Eos: 4 %
Hematocrit: 35.6 % (ref 34.0–46.6)
Hemoglobin: 11.1 g/dL (ref 11.1–15.9)
Immature Grans (Abs): 0 10*3/uL (ref 0.0–0.1)
Immature Granulocytes: 0 %
Lymphocytes Absolute: 0.9 10*3/uL (ref 0.7–3.1)
Lymphs: 22 %
MCH: 27.8 pg (ref 26.6–33.0)
MCHC: 31.2 g/dL — ABNORMAL LOW (ref 31.5–35.7)
MCV: 89 fL (ref 79–97)
Monocytes Absolute: 0.5 10*3/uL (ref 0.1–0.9)
Monocytes: 12 %
Neutrophils Absolute: 2.4 10*3/uL (ref 1.4–7.0)
Neutrophils: 60 %
Platelets: 190 10*3/uL (ref 150–450)
RBC: 4 x10E6/uL (ref 3.77–5.28)
RDW: 16.8 % — ABNORMAL HIGH (ref 11.7–15.4)
WBC: 4 10*3/uL (ref 3.4–10.8)

## 2023-12-06 ENCOUNTER — Telehealth: Payer: Self-pay

## 2023-12-06 NOTE — Telephone Encounter (Signed)
Pt notified for labs  

## 2023-12-06 NOTE — Telephone Encounter (Signed)
-----   Message from Carlean Jews sent at 12/04/2023 12:43 PM EST ----- Please let her know her labs are improved

## 2023-12-07 DIAGNOSIS — D509 Iron deficiency anemia, unspecified: Secondary | ICD-10-CM | POA: Diagnosis not present

## 2023-12-07 DIAGNOSIS — D689 Coagulation defect, unspecified: Secondary | ICD-10-CM | POA: Diagnosis not present

## 2023-12-07 DIAGNOSIS — D631 Anemia in chronic kidney disease: Secondary | ICD-10-CM | POA: Diagnosis not present

## 2023-12-07 DIAGNOSIS — R519 Headache, unspecified: Secondary | ICD-10-CM | POA: Diagnosis not present

## 2023-12-07 DIAGNOSIS — N186 End stage renal disease: Secondary | ICD-10-CM | POA: Diagnosis not present

## 2023-12-07 DIAGNOSIS — R52 Pain, unspecified: Secondary | ICD-10-CM | POA: Diagnosis not present

## 2023-12-07 DIAGNOSIS — N2581 Secondary hyperparathyroidism of renal origin: Secondary | ICD-10-CM | POA: Diagnosis not present

## 2023-12-07 DIAGNOSIS — Z992 Dependence on renal dialysis: Secondary | ICD-10-CM | POA: Diagnosis not present

## 2023-12-12 ENCOUNTER — Encounter: Payer: Self-pay | Admitting: Hematology and Oncology

## 2023-12-19 ENCOUNTER — Ambulatory Visit
Admission: RE | Admit: 2023-12-19 | Discharge: 2023-12-19 | Disposition: A | Discharge status: Home / Self Care | Payer: Medicare (Managed Care) | Source: Ambulatory Visit | Attending: Nurse Practitioner | Admitting: Nurse Practitioner

## 2023-12-19 DIAGNOSIS — E2839 Other primary ovarian failure: Secondary | ICD-10-CM | POA: Insufficient documentation

## 2024-01-07 ENCOUNTER — Encounter: Payer: Self-pay | Admitting: Hematology and Oncology

## 2024-01-07 DIAGNOSIS — N186 End stage renal disease: Secondary | ICD-10-CM | POA: Diagnosis not present

## 2024-01-07 DIAGNOSIS — Z992 Dependence on renal dialysis: Secondary | ICD-10-CM | POA: Diagnosis not present

## 2024-01-07 DIAGNOSIS — D509 Iron deficiency anemia, unspecified: Secondary | ICD-10-CM | POA: Diagnosis not present

## 2024-01-07 DIAGNOSIS — R519 Headache, unspecified: Secondary | ICD-10-CM | POA: Diagnosis not present

## 2024-01-07 DIAGNOSIS — R52 Pain, unspecified: Secondary | ICD-10-CM | POA: Diagnosis not present

## 2024-01-07 DIAGNOSIS — N2581 Secondary hyperparathyroidism of renal origin: Secondary | ICD-10-CM | POA: Diagnosis not present

## 2024-01-07 DIAGNOSIS — D631 Anemia in chronic kidney disease: Secondary | ICD-10-CM | POA: Diagnosis not present

## 2024-01-07 DIAGNOSIS — D689 Coagulation defect, unspecified: Secondary | ICD-10-CM | POA: Diagnosis not present

## 2024-01-08 ENCOUNTER — Ambulatory Visit (INDEPENDENT_AMBULATORY_CARE_PROVIDER_SITE_OTHER): Payer: Medicare Other | Admitting: Nurse Practitioner

## 2024-01-08 ENCOUNTER — Encounter: Payer: Self-pay | Admitting: Nurse Practitioner

## 2024-01-08 VITALS — BP 150/70 | HR 69 | Temp 98.1°F | Resp 16 | Ht 63.0 in | Wt 111.6 lb

## 2024-01-08 DIAGNOSIS — R0789 Other chest pain: Secondary | ICD-10-CM

## 2024-01-08 DIAGNOSIS — I209 Angina pectoris, unspecified: Secondary | ICD-10-CM

## 2024-01-08 NOTE — Progress Notes (Signed)
 [facility] [address] [place], [place] [address]  Internal MEDICINE  Office Visit Note  Patient Name: Kristen Francis  [id number]  [id number]  Date of Service: 01/08/2024  Chief Complaint  Patient presents with   Acute Visit    Chest pain/pressure     HPI Kristen Francis presents for an acute sick visit for chest pain Has been having chest pain/chest pressure that has been happening more frequently over the past week, for a few minutes at a time.  Last time she felt this pain was yesterday.  EKG done with minimal change from most recent EKG done in December -- still showing random ectopic beats, LVH, left atrial enlargement and nonspecific ST changes.      Current Medication:  Outpatient Encounter Medications as of 01/08/2024  Medication Sig   aspirin  EC 81 MG EC tablet Take 1 tablet (81 mg total) by mouth daily. Swallow whole.   B Complex-C-Folic Acid  (RENAL-VITE) 0.8 MG TABS Take 0.8 mg by mouth every evening. On dialysis days   benzonatate  (TESSALON ) 100 MG capsule Take 1 capsule (100 mg total) by mouth 2 (two) times daily as needed for cough.   carvedilol  (COREG ) 25 MG tablet TAKE 1 TABLET(25 MG) BY MOUTH TWICE DAILY WITH A MEAL   cholecalciferol  (VITAMIN D ) 1000 units tablet Take 1,000 Units by mouth daily.    cinacalcet  (SENSIPAR ) 60 MG tablet Take 120 mg by mouth daily with supper.   dronabinol  (MARINOL ) 5 MG capsule Take 1 capsule by mouth before meals twice daily on non-dialysis days   ferrous sulfate  325 (65 FE) MG tablet Take 1 tablet (325 mg total) by mouth 2 (two) times daily with a meal.   furosemide  (LASIX ) 80 MG tablet Take 1 tablet (80 mg total) by mouth 4 (four) times a week. On non-dialysis days   hydrALAZINE  (APRESOLINE ) 50 MG tablet TAKE 1 TABLET(50 MG) BY MOUTH THREE TIMES DAILY.   lidocaine -prilocaine  (EMLA ) cream Apply 1 Application topically as directed.   omeprazole  (PRILOSEC) 40 MG capsule TAKE 1 CAPSULE(40 MG) BY MOUTH DAILY   rosuvastatin   (CRESTOR ) 5 MG tablet Take 5 mg by mouth See admin instructions. (Take on non-dialysis days)   sacubitril -valsartan  (ENTRESTO ) 49-51 MG Take 1 tablet by mouth every 12 (twelve) hours.   sennosides-docusate sodium  (SENOKOT-S) 8.6-50 MG tablet Take 2 tablets by mouth daily.   sevelamer  carbonate (RENVELA ) 800 MG tablet Take 1,600 mg by mouth 3 (three) times daily with meals.   No facility-administered encounter medications on file as of 01/08/2024.      Medical History: Past Medical History:  Diagnosis Date   Anemia    Arthritis    Chronic kidney disease    ESRD   Complication of anesthesia    had procedure and felt incision early 80s   Gout    Hyperlipemia    Hypertension    Macular degeneration, right eye    Stroke (HCC)      Vital Signs: BP (!) 150/70   Pulse 69   Temp 98.1 F (36.7 C)   Resp 16   Ht 5' 3 (1.6 m)   Wt 111 lb 9.6 oz (50.6 kg)   SpO2 95%   BMI 19.77 kg/m    Review of Systems  Constitutional:  Positive for activity change, fatigue and unexpected weight change.  Respiratory:  Positive for chest tightness and shortness of breath. Negative for cough and wheezing.   Cardiovascular:  Positive for chest pain. Negative for palpitations.  Gastrointestinal:  Positive for nausea.  Neurological:  Positive for weakness.    Physical Exam Vitals reviewed.  Constitutional:      General: She is not in acute distress.    Appearance: Normal appearance. She is normal weight. She is ill-appearing.  HENT:     Head: Normocephalic and atraumatic.  Eyes:     Pupils: Pupils are equal, round, and reactive to light.  Cardiovascular:     Rate and Rhythm: Normal rate and regular rhythm.     Heart sounds: Murmur heard.  Pulmonary:     Effort: Pulmonary effort is normal. No respiratory distress.     Breath sounds: Normal breath sounds. No wheezing.  Neurological:     Mental Status: She is alert and oriented to person, place, and time.  Psychiatric:        Mood and  Affect: Mood normal.        Behavior: Behavior normal.       Assessment/Plan: 1. Angina pectoris (HCC) (Primary) Stable for now, patient instructed to call cardiologist if the pain returns or worsens.   2. Atypical chest pain EKG done today, no significant change from her EKG in December last year, sees cardiology.  - EKG 12-Lead   General Counseling: Kristen Francis verbalizes understanding of the findings of todays visit and agrees with plan of treatment. I have discussed any further diagnostic evaluation that may be needed or ordered today. We also reviewed her medications today. she has been encouraged to call the office with any questions or concerns that should arise related to todays visit.    Counseling:    Orders Placed This Encounter  Procedures   EKG 12-Lead    No orders of the defined types were placed in this encounter.   Return if symptoms worsen or fail to improve, for and patient will let her cardiologist know if she continues to have the chest pains. .  [place] Controlled Substance Database was reviewed by me for overdose risk score (ORS)  Time spent:20 Minutes Time spent with patient included reviewing progress notes, labs, imaging studies, and discussing plan for follow up.   This patient was seen by Mardy Maxin, FNP-C in collaboration with Dr. Sigrid Bathe as a part of collaborative care agreement.  Arnecia Ector R. Maxin, MSN, FNP-C Internal Medicine

## 2024-01-31 ENCOUNTER — Other Ambulatory Visit: Payer: Self-pay

## 2024-01-31 MED ORDER — FERROUS SULFATE 325 (65 FE) MG PO TABS: 325.0000 mg | ORAL_TABLET | Freq: Two times a day (BID) | ORAL | 2 refills | Status: DC

## 2024-02-04 DIAGNOSIS — N2581 Secondary hyperparathyroidism of renal origin: Secondary | ICD-10-CM | POA: Diagnosis not present

## 2024-02-04 DIAGNOSIS — D509 Iron deficiency anemia, unspecified: Secondary | ICD-10-CM | POA: Diagnosis not present

## 2024-02-04 DIAGNOSIS — D631 Anemia in chronic kidney disease: Secondary | ICD-10-CM | POA: Diagnosis not present

## 2024-02-04 DIAGNOSIS — Z992 Dependence on renal dialysis: Secondary | ICD-10-CM | POA: Diagnosis not present

## 2024-02-04 DIAGNOSIS — D689 Coagulation defect, unspecified: Secondary | ICD-10-CM | POA: Diagnosis not present

## 2024-02-04 DIAGNOSIS — R52 Pain, unspecified: Secondary | ICD-10-CM | POA: Diagnosis not present

## 2024-02-04 DIAGNOSIS — N186 End stage renal disease: Secondary | ICD-10-CM | POA: Diagnosis not present

## 2024-02-26 DIAGNOSIS — I3139 Other pericardial effusion (noninflammatory): Secondary | ICD-10-CM | POA: Diagnosis not present

## 2024-02-26 DIAGNOSIS — I10 Essential (primary) hypertension: Secondary | ICD-10-CM | POA: Diagnosis not present

## 2024-02-26 DIAGNOSIS — I5020 Unspecified systolic (congestive) heart failure: Secondary | ICD-10-CM | POA: Diagnosis not present

## 2024-02-26 DIAGNOSIS — Z992 Dependence on renal dialysis: Secondary | ICD-10-CM | POA: Diagnosis not present

## 2024-02-26 DIAGNOSIS — N186 End stage renal disease: Secondary | ICD-10-CM | POA: Diagnosis not present

## 2024-02-26 DIAGNOSIS — I272 Pulmonary hypertension, unspecified: Secondary | ICD-10-CM | POA: Diagnosis not present

## 2024-02-26 DIAGNOSIS — I340 Nonrheumatic mitral (valve) insufficiency: Secondary | ICD-10-CM | POA: Diagnosis not present

## 2024-03-03 DIAGNOSIS — Z992 Dependence on renal dialysis: Secondary | ICD-10-CM | POA: Diagnosis not present

## 2024-03-03 DIAGNOSIS — N186 End stage renal disease: Secondary | ICD-10-CM | POA: Diagnosis not present

## 2024-03-03 DIAGNOSIS — N040 Nephrotic syndrome with minor glomerular abnormality: Secondary | ICD-10-CM | POA: Diagnosis not present

## 2024-03-05 DIAGNOSIS — N186 End stage renal disease: Secondary | ICD-10-CM | POA: Diagnosis not present

## 2024-03-05 DIAGNOSIS — L239 Allergic contact dermatitis, unspecified cause: Secondary | ICD-10-CM | POA: Diagnosis not present

## 2024-03-05 DIAGNOSIS — D689 Coagulation defect, unspecified: Secondary | ICD-10-CM | POA: Diagnosis not present

## 2024-03-05 DIAGNOSIS — Z992 Dependence on renal dialysis: Secondary | ICD-10-CM | POA: Diagnosis not present

## 2024-03-05 DIAGNOSIS — R519 Headache, unspecified: Secondary | ICD-10-CM | POA: Diagnosis not present

## 2024-03-05 DIAGNOSIS — N2581 Secondary hyperparathyroidism of renal origin: Secondary | ICD-10-CM | POA: Diagnosis not present

## 2024-03-05 DIAGNOSIS — D631 Anemia in chronic kidney disease: Secondary | ICD-10-CM | POA: Diagnosis not present

## 2024-03-05 DIAGNOSIS — D509 Iron deficiency anemia, unspecified: Secondary | ICD-10-CM | POA: Diagnosis not present

## 2024-03-25 ENCOUNTER — Other Ambulatory Visit: Payer: Self-pay | Admitting: Nurse Practitioner

## 2024-03-25 DIAGNOSIS — I10 Essential (primary) hypertension: Secondary | ICD-10-CM

## 2024-04-02 DIAGNOSIS — N040 Nephrotic syndrome with minor glomerular abnormality: Secondary | ICD-10-CM | POA: Diagnosis not present

## 2024-04-02 DIAGNOSIS — N186 End stage renal disease: Secondary | ICD-10-CM | POA: Diagnosis not present

## 2024-04-02 DIAGNOSIS — Z992 Dependence on renal dialysis: Secondary | ICD-10-CM | POA: Diagnosis not present

## 2024-04-04 DIAGNOSIS — N2581 Secondary hyperparathyroidism of renal origin: Secondary | ICD-10-CM | POA: Diagnosis not present

## 2024-04-04 DIAGNOSIS — D689 Coagulation defect, unspecified: Secondary | ICD-10-CM | POA: Diagnosis not present

## 2024-04-04 DIAGNOSIS — N186 End stage renal disease: Secondary | ICD-10-CM | POA: Diagnosis not present

## 2024-04-04 DIAGNOSIS — R509 Fever, unspecified: Secondary | ICD-10-CM | POA: Diagnosis not present

## 2024-04-04 DIAGNOSIS — D509 Iron deficiency anemia, unspecified: Secondary | ICD-10-CM | POA: Diagnosis not present

## 2024-04-04 DIAGNOSIS — Z992 Dependence on renal dialysis: Secondary | ICD-10-CM | POA: Diagnosis not present

## 2024-04-04 DIAGNOSIS — D631 Anemia in chronic kidney disease: Secondary | ICD-10-CM | POA: Diagnosis not present

## 2024-04-04 DIAGNOSIS — R52 Pain, unspecified: Secondary | ICD-10-CM | POA: Diagnosis not present

## 2024-04-06 ENCOUNTER — Encounter: Payer: Self-pay | Admitting: Hematology and Oncology

## 2024-04-22 ENCOUNTER — Other Ambulatory Visit: Payer: Self-pay | Admitting: Nurse Practitioner

## 2024-04-22 DIAGNOSIS — K219 Gastro-esophageal reflux disease without esophagitis: Secondary | ICD-10-CM

## 2024-04-24 ENCOUNTER — Other Ambulatory Visit: Payer: Self-pay | Admitting: Nurse Practitioner

## 2024-04-24 ENCOUNTER — Telehealth: Payer: Self-pay | Admitting: Nurse Practitioner

## 2024-04-24 DIAGNOSIS — I10 Essential (primary) hypertension: Secondary | ICD-10-CM

## 2024-04-24 NOTE — Telephone Encounter (Signed)
 Lvm to move 05/29/24 appointment-Toni

## 2024-04-29 ENCOUNTER — Encounter: Payer: Self-pay | Admitting: Hematology and Oncology

## 2024-04-30 ENCOUNTER — Other Ambulatory Visit (INDEPENDENT_AMBULATORY_CARE_PROVIDER_SITE_OTHER): Payer: Self-pay | Admitting: Nurse Practitioner

## 2024-04-30 DIAGNOSIS — N186 End stage renal disease: Secondary | ICD-10-CM

## 2024-05-02 MED ORDER — HYDRALAZINE HCL 50 MG PO TABS: ORAL_TABLET | ORAL | 2 refills | Status: DC

## 2024-05-03 DIAGNOSIS — Z992 Dependence on renal dialysis: Secondary | ICD-10-CM | POA: Diagnosis not present

## 2024-05-03 DIAGNOSIS — N040 Nephrotic syndrome with minor glomerular abnormality: Secondary | ICD-10-CM | POA: Diagnosis not present

## 2024-05-03 DIAGNOSIS — N186 End stage renal disease: Secondary | ICD-10-CM | POA: Diagnosis not present

## 2024-05-05 DIAGNOSIS — Z992 Dependence on renal dialysis: Secondary | ICD-10-CM | POA: Diagnosis not present

## 2024-05-05 DIAGNOSIS — D689 Coagulation defect, unspecified: Secondary | ICD-10-CM | POA: Diagnosis not present

## 2024-05-05 DIAGNOSIS — D509 Iron deficiency anemia, unspecified: Secondary | ICD-10-CM | POA: Diagnosis not present

## 2024-05-05 DIAGNOSIS — N2581 Secondary hyperparathyroidism of renal origin: Secondary | ICD-10-CM | POA: Diagnosis not present

## 2024-05-05 DIAGNOSIS — N186 End stage renal disease: Secondary | ICD-10-CM | POA: Diagnosis not present

## 2024-05-05 DIAGNOSIS — D631 Anemia in chronic kidney disease: Secondary | ICD-10-CM | POA: Diagnosis not present

## 2024-05-05 DIAGNOSIS — R52 Pain, unspecified: Secondary | ICD-10-CM | POA: Diagnosis not present

## 2024-05-06 ENCOUNTER — Encounter (INDEPENDENT_AMBULATORY_CARE_PROVIDER_SITE_OTHER): Payer: Self-pay | Admitting: Nurse Practitioner

## 2024-05-06 ENCOUNTER — Ambulatory Visit (INDEPENDENT_AMBULATORY_CARE_PROVIDER_SITE_OTHER): Payer: Medicare (Managed Care) | Admitting: Nurse Practitioner

## 2024-05-06 ENCOUNTER — Ambulatory Visit (INDEPENDENT_AMBULATORY_CARE_PROVIDER_SITE_OTHER): Payer: Medicare (Managed Care)

## 2024-05-06 VITALS — BP 158/73 | HR 65 | Resp 18 | Ht 63.0 in | Wt 115.2 lb

## 2024-05-06 DIAGNOSIS — E1129 Type 2 diabetes mellitus with other diabetic kidney complication: Secondary | ICD-10-CM

## 2024-05-06 DIAGNOSIS — N186 End stage renal disease: Secondary | ICD-10-CM | POA: Diagnosis not present

## 2024-05-06 DIAGNOSIS — I10 Essential (primary) hypertension: Secondary | ICD-10-CM | POA: Diagnosis not present

## 2024-05-06 DIAGNOSIS — I6522 Occlusion and stenosis of left carotid artery: Secondary | ICD-10-CM | POA: Diagnosis not present

## 2024-05-06 NOTE — Progress Notes (Signed)
 Subjective:    Patient ID: Kristen Francis, female    DOB: [birth year], 78 y.o.   MRN: [id number] No chief complaint on file.   The patient returns to the office for followup status post intervention of their dialysis access left brachial axillary AV fistual .   Following the intervention the access function has significantly improved, with better flow rates and improved KT/V. The patient has noted some occasional bleeding episodes following decannulation but has denied any issues during running dialysis.  The patient denies an increase in arm swelling. At the present time the patient denies hand pain.  No recent shortening of the patient's walking distance or new symptoms consistent with claudication.  No history of rest pain symptoms. No new ulcers or wounds of the lower extremities have occurred.  The patient denies amaurosis fugax or recent TIA symptoms. There are no recent neurological changes noted. There is no history of DVT, PE or superficial thrombophlebitis. No recent episodes of angina or shortness of breath documented.   Duplex ultrasound of the AV access shows a patent access.  She has a flow volume of 1408.  Previous flow volume was 2188.    Review of Systems  Hematological:  Does not bruise/bleed easily.  All other systems reviewed and are negative.      Objective:   Physical Exam Vitals reviewed.  HENT:     Head: Normocephalic.  Cardiovascular:     Rate and Rhythm: Normal rate.     Pulses: Normal pulses.     Arteriovenous access: Left arteriovenous access is present.    Comments: Good thrill and bruit Pulmonary:     Effort: Pulmonary effort is normal.  Skin:    General: Skin is warm and dry.  Neurological:     Mental Status: She is alert and oriented to person, place, and time.  Psychiatric:        Mood and Affect: Mood normal.        Behavior: Behavior normal.        Thought Content: Thought content normal.        Judgment: Judgment normal.      There were no vitals taken for this visit.  Past Medical History:  Diagnosis Date   Anemia    Arthritis    Chronic kidney disease    ESRD   Complication of anesthesia    had procedure and felt incision early 80s   Gout    Hyperlipemia    Hypertension    Macular degeneration, right eye    Stroke [facility])     Social History   Socioeconomic History   Marital status: Married    Spouse name: Not on file   Number of children: Not on file   Years of education: Not on file   Highest education level: Not on file  Occupational History   Not on file  Tobacco Use   Smoking status: Former   Smokeless tobacco: Never  Vaping Use   Vaping status: Never Used  Substance and Sexual Activity   Alcohol use: No   Drug use: No   Sexual activity: Not on file  Other Topics Concern   Not on file  Social History Narrative   Lives in  [place]; used to clean; no smoking; no alcohol.    Social Drivers of Corporate investment banker Strain: Not on file  Food Insecurity: No Food Insecurity (10/22/2023)   Hunger Vital Sign    Worried About Running Out of Food in  the Last Year: Never true    Ran Out of Food in the Last Year: Never true  Transportation Needs: No Transportation Needs (10/22/2023)   PRAPARE - Administrator, Civil Service (Medical): No    Lack of Transportation (Non-Medical): No  Physical Activity: Not on file  Stress: Not on file  Social Connections: Not on file  Intimate Partner Violence: Not At Risk (10/22/2023)   Humiliation, Afraid, Rape, and Kick questionnaire    Fear of Current or Ex-Partner: No    Emotionally Abused: No    Physically Abused: No    Sexually Abused: No    Past Surgical History:  Procedure Laterality Date   A/V FISTULAGRAM Left 12/25/2018   Procedure: A/V FISTULAGRAM;  Surgeon: Jackquelyn Mass, MD;  Location: [facility] INVASIVE CV LAB;  Service: Cardiovascular;  Laterality: Left;   A/V FISTULAGRAM Left 10/09/2023   Procedure: A/V  Fistulagram;  Surgeon: Jackquelyn Mass, MD;  Location: [facility] INVASIVE CV LAB;  Service: Cardiovascular;  Laterality: Left;   ABDOMINAL HYSTERECTOMY     APPENDECTOMY     AV FISTULA PLACEMENT Left 07/12/2018   Procedure: INSERTION OF ARTERIOVENOUS (AV) GORE-TEX GRAFT ARM ( BRACHIAL AXILLARY );  Surgeon: Jackquelyn Mass, MD;  Location: [facility] ORS;  Service: Vascular;  Laterality: Left;   COLONOSCOPY WITH PROPOFOL  N/A 01/14/2016   Procedure: COLONOSCOPY WITH PROPOFOL ;  Surgeon: Cassie Click, MD;  Location: [facility] ENDOSCOPY;  Service: Endoscopy;  Laterality: N/A;   COLONOSCOPY WITH PROPOFOL  N/A 02/14/2022   Procedure: COLONOSCOPY WITH PROPOFOL ;  Surgeon: Shane Darling, MD;  Location: [facility] ENDOSCOPY;  Service: Endoscopy;  Laterality: N/A;   RIGHT/LEFT HEART CATH AND CORONARY ANGIOGRAPHY N/A 10/25/2023   Procedure: RIGHT/LEFT HEART CATH AND CORONARY ANGIOGRAPHY;  Surgeon: Percival Brace, MD;  Location: [facility] INVASIVE CV LAB;  Service: Cardiovascular;  Laterality: N/A;   TONSILLECTOMY      Family History  Problem Relation Age of Onset   Kidney disease Mother    Breast cancer Neg Hx     Allergies  Allergen Reactions   Other Other (See Comments)    Pt does not receive Blood    Penicillins Hives    Has patient had a PCN reaction causing immediate rash, facial/tongue/throat swelling, SOB or lightheadedness with hypotension: YES Has patient had a PCN reaction causing severe rash involving mucus membranes or skin necrosis: no Has patient had a PCN reaction that required hospitalization: no Has patient had a PCN reaction occurring within the last 10 years: no If all of the above answers are "NO", then may proceed with Cephalosporin use.    Sodium Bicarbonate Itching       Latest Ref Rng & Units 12/03/2023    1:42 PM 11/13/2023    5:18 AM 11/12/2023    5:59 AM  CBC  WBC 3.4 - 10.8 x10E3/uL 4.0  6.0  7.3   Hemoglobin 11.1 - 15.9 g/dL 16.1  8.8  09.6   Hematocrit 34.0 - 46.6 % 35.6   28.3  34.7   Platelets 150 - 450 x10E3/uL 190  155  207       CMP     Component Value Date/Time   NA 137 11/13/2023 0518   NA 139 07/02/2020 1105   K 4.3 11/13/2023 0518   CL 98 11/13/2023 0518   CO2 29 11/13/2023 0518   GLUCOSE 82 11/13/2023 0518   BUN 39 (H) 11/13/2023 0518   BUN 13 07/02/2020 1105   CREATININE 5.21 (H) 11/13/2023 0518  CALCIUM  8.8 (L) 11/13/2023 0518   PROT 6.0 (L) 11/13/2023 0518   PROT 6.6 07/02/2020 1105   ALBUMIN 3.2 (L) 11/13/2023 0518   ALBUMIN 4.0 07/02/2020 1105   AST 17 11/13/2023 0518   ALT 13 11/13/2023 0518   ALKPHOS 33 (L) 11/13/2023 0518   BILITOT 0.9 11/13/2023 0518   BILITOT 0.3 07/02/2020 1105   GFRNONAA 8 (L) 11/13/2023 0518     No results found.     Assessment & Plan:   1. End stage renal disease (HCC) Recommend:  The patient is doing well and currently has adequate dialysis access. The patient's dialysis center is not reporting any access issues. Flow pattern is stable when compared to the prior ultrasound.  The patient should have a duplex ultrasound of the dialysis access in 6 months. The patient will follow-up with me in the office after each ultrasound    2. Left carotid stenosis When patient returns in 6 months we will reevaluate her carotid arteries.  Previously there was stable at 1 to 39% bilaterally.  3. Essential hypertension Continue antihypertensive medications as already ordered, these medications have been reviewed and there are no changes at this time.  4. Type 2 diabetes mellitus with other diabetic kidney complication (HCC) Continue hypoglycemic medications as already ordered, these medications have been reviewed and there are no changes at this time.  Hgb A1C to be monitored as already arranged by primary service   Current Outpatient Medications on File Prior to Visit  Medication Sig Dispense Refill   aspirin  EC 81 MG EC tablet Take 1 tablet (81 mg total) by mouth daily. Swallow whole. 30 tablet  0   B Complex-C-Folic Acid  (RENAL-VITE) 0.8 MG TABS Take 0.8 mg by mouth every evening. On dialysis days     benzonatate  (TESSALON ) 100 MG capsule Take 1 capsule (100 mg total) by mouth 2 (two) times daily as needed for cough. 60 capsule 3   carvedilol  (COREG ) 25 MG tablet TAKE 1 TABLET(25 MG) BY MOUTH TWICE DAILY WITH A MEAL 180 tablet 3   cholecalciferol  (VITAMIN D ) 1000 units tablet Take 1,000 Units by mouth daily.      cinacalcet  (SENSIPAR ) 60 MG tablet Take 120 mg by mouth daily with supper.     dronabinol  (MARINOL ) 5 MG capsule Take 1 capsule by mouth before meals twice daily on non-dialysis days 40 capsule 2   FEROSUL 325 (65 Fe) MG tablet TAKE 1 TABLET BY MOUTH TWICE DAILY WITH A MEAL 60 tablet 2   furosemide  (LASIX ) 80 MG tablet Take 1 tablet (80 mg total) by mouth 4 (four) times a week. On non-dialysis days 16 tablet 2   hydrALAZINE  (APRESOLINE ) 50 MG tablet TAKE 1 TABLET(50 MG) BY MOUTH THREE TIMES DAILY. 90 tablet 2   lidocaine -prilocaine  (EMLA ) cream Apply 1 Application topically as directed.  6   omeprazole  (PRILOSEC) 40 MG capsule TAKE 1 CAPSULE(40 MG) BY MOUTH DAILY 90 capsule 3   rosuvastatin  (CRESTOR ) 5 MG tablet Take 5 mg by mouth See admin instructions. (Take on non-dialysis days)     sacubitril -valsartan  (ENTRESTO ) 49-51 MG Take 1 tablet by mouth every 12 (twelve) hours.     sennosides-docusate sodium  (SENOKOT-S) 8.6-50 MG tablet Take 2 tablets by mouth daily.     sevelamer  carbonate (RENVELA ) 800 MG tablet Take 1,600 mg by mouth 3 (three) times daily with meals.     No current facility-administered medications on file prior to visit.    There are no Patient Instructions on file  for this visit. No follow-ups on file.   Chayce Robbins E Nole Robey, NP

## 2024-05-22 ENCOUNTER — Telehealth: Payer: Self-pay | Admitting: Nurse Practitioner

## 2024-05-22 NOTE — Telephone Encounter (Signed)
 Left vm to confirm 05/29/24 appointment-Toni

## 2024-05-28 DIAGNOSIS — L308 Other specified dermatitis: Secondary | ICD-10-CM | POA: Diagnosis not present

## 2024-05-28 DIAGNOSIS — Z992 Dependence on renal dialysis: Secondary | ICD-10-CM | POA: Diagnosis not present

## 2024-05-28 DIAGNOSIS — N186 End stage renal disease: Secondary | ICD-10-CM | POA: Diagnosis not present

## 2024-05-28 DIAGNOSIS — B028 Zoster with other complications: Secondary | ICD-10-CM | POA: Diagnosis not present

## 2024-05-29 ENCOUNTER — Ambulatory Visit: Payer: Medicare (Managed Care) | Admitting: Nurse Practitioner

## 2024-05-31 ENCOUNTER — Other Ambulatory Visit: Payer: Self-pay | Admitting: Nurse Practitioner

## 2024-05-31 DIAGNOSIS — R058 Other specified cough: Secondary | ICD-10-CM

## 2024-06-02 ENCOUNTER — Encounter: Payer: Self-pay | Admitting: Nurse Practitioner

## 2024-06-02 ENCOUNTER — Ambulatory Visit (INDEPENDENT_AMBULATORY_CARE_PROVIDER_SITE_OTHER): Payer: Medicare (Managed Care) | Admitting: Nurse Practitioner

## 2024-06-02 VITALS — BP 160/74 | HR 80 | Temp 98.4°F | Resp 16 | Ht 63.0 in | Wt 116.2 lb

## 2024-06-02 DIAGNOSIS — B028 Zoster with other complications: Secondary | ICD-10-CM | POA: Diagnosis not present

## 2024-06-02 DIAGNOSIS — N186 End stage renal disease: Secondary | ICD-10-CM | POA: Diagnosis not present

## 2024-06-02 DIAGNOSIS — L299 Pruritus, unspecified: Secondary | ICD-10-CM

## 2024-06-02 DIAGNOSIS — N040 Nephrotic syndrome with minor glomerular abnormality: Secondary | ICD-10-CM | POA: Diagnosis not present

## 2024-06-02 DIAGNOSIS — Z992 Dependence on renal dialysis: Secondary | ICD-10-CM | POA: Diagnosis not present

## 2024-06-02 MED ORDER — PREDNISONE 10 MG PO TABS: ORAL_TABLET | ORAL | 0 refills | Status: DC

## 2024-06-02 NOTE — Progress Notes (Signed)
 [facility] [address] [place], [place] [address]  Internal MEDICINE  Office Visit Note  Patient Name: Kristen Francis  [id number]  [id number]  Date of Service: 06/02/2024  Chief Complaint  Patient presents with   Acute Visit    Shingles.  Left side     HPI Kristen Francis presents for an acute sick visit for shingles rash --recently diagnosed with shingles, small amount of rash on her left side and back.  She has taken 6 out of 7 days of the valacyclovir but refused to take the last 2 doses because the medication made her feel dizzy and sick on her stomach.  She also feels itchy all over, on her side but also on her hands and just in general.  Limited with what medications she can take due to ESRD and dialysis.       Current Medication:  Outpatient Encounter Medications as of 06/02/2024  Medication Sig   predniSONE  (DELTASONE ) 10 MG tablet Take one tab by mouth  3 x daily for 3 days, then take one tab 2 x daily for 3 days and then take one tab daily for 3 days then stop   amLODipine  (NORVASC ) 10 MG tablet Take 10 mg by mouth daily.   aspirin  EC 81 MG EC tablet Take 1 tablet (81 mg total) by mouth daily. Swallow whole.   B Complex-C-Folic Acid  (RENAL-VITE) 0.8 MG TABS Take 0.8 mg by mouth every evening. On dialysis days   benzonatate  (TESSALON ) 100 MG capsule Take 1 capsule (100 mg total) by mouth 2 (two) times daily as needed for cough.   carvedilol  (COREG ) 25 MG tablet TAKE 1 TABLET(25 MG) BY MOUTH TWICE DAILY WITH A MEAL   cholecalciferol  (VITAMIN D ) 1000 units tablet Take 1,000 Units by mouth daily.    cinacalcet  (SENSIPAR ) 60 MG tablet Take 120 mg by mouth daily with supper.   cyanocobalamin  (VITAMIN B12) 1000 MCG tablet Take 1,000 mcg by mouth.   dronabinol  (MARINOL ) 5 MG capsule Take 1 capsule by mouth before meals twice daily on non-dialysis days   FEROSUL 325 (65 Fe) MG tablet TAKE 1 TABLET BY MOUTH TWICE DAILY WITH A MEAL   furosemide  (LASIX ) 80 MG tablet  Take 1 tablet (80 mg total) by mouth 4 (four) times a week. On non-dialysis days   hydrALAZINE  (APRESOLINE ) 50 MG tablet TAKE 1 TABLET(50 MG) BY MOUTH THREE TIMES DAILY.   lidocaine -prilocaine  (EMLA ) cream Apply 1 Application topically as directed.   omeprazole  (PRILOSEC) 40 MG capsule TAKE 1 CAPSULE(40 MG) BY MOUTH DAILY   rosuvastatin  (CRESTOR ) 5 MG tablet Take 5 mg by mouth See admin instructions. (Take on non-dialysis days)   sacubitril -valsartan  (ENTRESTO ) 49-51 MG Take 1 tablet by mouth every 12 (twelve) hours.   sennosides-docusate sodium  (SENOKOT-S) 8.6-50 MG tablet Take 2 tablets by mouth daily.   sevelamer  carbonate (RENVELA ) 800 MG tablet Take 1,600 mg by mouth 3 (three) times daily with meals.   No facility-administered encounter medications on file as of 06/02/2024.      Medical History: Past Medical History:  Diagnosis Date   Anemia    Arthritis    Chronic kidney disease    ESRD   Complication of anesthesia    had procedure and felt incision early 80s   Gout    Hyperlipemia    Hypertension    Macular degeneration, right eye    Stroke (HCC)      Vital Signs: BP (!) 160/74   Pulse 80  Temp 98.4 F (36.9 C)   Resp 16   Ht 5' 3 (1.6 m)   Wt 116 lb 3.2 oz (52.7 kg)   SpO2 98%   BMI 20.58 kg/m    Review of Systems  Constitutional:  Positive for fatigue.  Respiratory: Negative.  Negative for cough, chest tightness, shortness of breath and wheezing.   Cardiovascular: Negative.  Negative for chest pain and palpitations.  Musculoskeletal:  Positive for arthralgias.  Skin:  Positive for rash (shingles rash left side and back, itching).    Physical Exam Vitals reviewed.  Constitutional:      General: She is not in acute distress.    Appearance: Normal appearance. She is not ill-appearing.  HENT:     Head: Normocephalic and atraumatic.   Eyes:     Pupils: Pupils are equal, round, and reactive to light.    Cardiovascular:     Rate and Rhythm: Normal  rate and regular rhythm.  Pulmonary:     Effort: Pulmonary effort is normal. No respiratory distress.   Skin:    General: Skin is warm and dry.     Findings: Rash (small amount of pustules on left side, tender and burning per patient, itching as well) present.   Neurological:     Mental Status: She is alert and oriented to person, place, and time.   Psychiatric:        Mood and Affect: Mood normal.        Behavior: Behavior normal.       Assessment/Plan: 1. Herpes zoster with other complication (Primary) Prednisone  taper prescribed. Patient took 6 out of 7 days of valacyclovir - predniSONE  (DELTASONE ) 10 MG tablet; Take one tab by mouth  3 x daily for 3 days, then take one tab 2 x daily for 3 days and then take one tab daily for 3 days then stop  Dispense: 18 tablet; Refill: 0  2. Generalized pruritus Prednisone  taper prescribed. Take until gone.  - predniSONE  (DELTASONE ) 10 MG tablet; Take one tab by mouth  3 x daily for 3 days, then take one tab 2 x daily for 3 days and then take one tab daily for 3 days then stop  Dispense: 18 tablet; Refill: 0  3. ESRD (end stage renal disease) on dialysis (HCC) Cannot given patient any hydroxyzine, cetirizine, loratadine or other allergy medication to alleviate the itching since these medications are not removed by dialysis.    General Counseling: Kristen Francis verbalizes understanding of the findings of todays visit and agrees with plan of treatment. I have discussed any further diagnostic evaluation that may be needed or ordered today. We also reviewed her medications today. she has been encouraged to call the office with any questions or concerns that should arise related to todays visit.    Counseling:    No orders of the defined types were placed in this encounter.   Meds ordered this encounter  Medications   predniSONE  (DELTASONE ) 10 MG tablet    Sig: Take one tab by mouth  3 x daily for 3 days, then take one tab 2 x daily for 3 days  and then take one tab daily for 3 days then stop    Dispense:  18 tablet    Refill:  0    Fill new script today    Return in about 3 months (around 09/02/2024) for F/U, Kirke Breach PCP.   Controlled Substance Database was reviewed by me for overdose risk score (ORS)  Time spent:30 Minutes Time  spent with patient included reviewing progress notes, labs, imaging studies, and discussing plan for follow up.   This patient was seen by Mardy Maxin, FNP-C in collaboration with Dr. Sigrid Bathe as a part of collaborative care agreement.  Kristen Esperanza R. Maxin, MSN, FNP-C Internal Medicine

## 2024-06-04 DIAGNOSIS — Z992 Dependence on renal dialysis: Secondary | ICD-10-CM | POA: Diagnosis not present

## 2024-06-04 DIAGNOSIS — R52 Pain, unspecified: Secondary | ICD-10-CM | POA: Diagnosis not present

## 2024-06-04 DIAGNOSIS — D631 Anemia in chronic kidney disease: Secondary | ICD-10-CM | POA: Diagnosis not present

## 2024-06-04 DIAGNOSIS — D689 Coagulation defect, unspecified: Secondary | ICD-10-CM | POA: Diagnosis not present

## 2024-06-04 DIAGNOSIS — N186 End stage renal disease: Secondary | ICD-10-CM | POA: Diagnosis not present

## 2024-06-04 DIAGNOSIS — D509 Iron deficiency anemia, unspecified: Secondary | ICD-10-CM | POA: Diagnosis not present

## 2024-06-04 DIAGNOSIS — N2581 Secondary hyperparathyroidism of renal origin: Secondary | ICD-10-CM | POA: Diagnosis not present

## 2024-06-05 ENCOUNTER — Encounter: Payer: Self-pay | Admitting: Nurse Practitioner

## 2024-06-17 DIAGNOSIS — N186 End stage renal disease: Secondary | ICD-10-CM | POA: Diagnosis not present

## 2024-06-17 DIAGNOSIS — I10 Essential (primary) hypertension: Secondary | ICD-10-CM | POA: Diagnosis not present

## 2024-06-17 DIAGNOSIS — I5020 Unspecified systolic (congestive) heart failure: Secondary | ICD-10-CM | POA: Diagnosis not present

## 2024-06-17 DIAGNOSIS — I272 Pulmonary hypertension, unspecified: Secondary | ICD-10-CM | POA: Diagnosis not present

## 2024-06-17 DIAGNOSIS — I340 Nonrheumatic mitral (valve) insufficiency: Secondary | ICD-10-CM | POA: Diagnosis not present

## 2024-06-17 DIAGNOSIS — I3139 Other pericardial effusion (noninflammatory): Secondary | ICD-10-CM | POA: Diagnosis not present

## 2024-06-17 DIAGNOSIS — Z992 Dependence on renal dialysis: Secondary | ICD-10-CM | POA: Diagnosis not present

## 2024-07-02 ENCOUNTER — Other Ambulatory Visit: Payer: Self-pay | Admitting: Nurse Practitioner

## 2024-07-02 DIAGNOSIS — I10 Essential (primary) hypertension: Secondary | ICD-10-CM

## 2024-07-03 DIAGNOSIS — N040 Nephrotic syndrome with minor glomerular abnormality: Secondary | ICD-10-CM | POA: Diagnosis not present

## 2024-07-03 DIAGNOSIS — Z992 Dependence on renal dialysis: Secondary | ICD-10-CM | POA: Diagnosis not present

## 2024-07-03 DIAGNOSIS — N186 End stage renal disease: Secondary | ICD-10-CM | POA: Diagnosis not present

## 2024-07-04 DIAGNOSIS — N186 End stage renal disease: Secondary | ICD-10-CM | POA: Diagnosis not present

## 2024-07-04 DIAGNOSIS — Z992 Dependence on renal dialysis: Secondary | ICD-10-CM | POA: Diagnosis not present

## 2024-07-04 DIAGNOSIS — D689 Coagulation defect, unspecified: Secondary | ICD-10-CM | POA: Diagnosis not present

## 2024-07-04 DIAGNOSIS — D509 Iron deficiency anemia, unspecified: Secondary | ICD-10-CM | POA: Diagnosis not present

## 2024-07-04 DIAGNOSIS — R52 Pain, unspecified: Secondary | ICD-10-CM | POA: Diagnosis not present

## 2024-07-04 DIAGNOSIS — D631 Anemia in chronic kidney disease: Secondary | ICD-10-CM | POA: Diagnosis not present

## 2024-07-04 DIAGNOSIS — N2581 Secondary hyperparathyroidism of renal origin: Secondary | ICD-10-CM | POA: Diagnosis not present

## 2024-07-20 ENCOUNTER — Other Ambulatory Visit: Payer: Self-pay | Admitting: Nurse Practitioner

## 2024-07-20 DIAGNOSIS — I10 Essential (primary) hypertension: Secondary | ICD-10-CM

## 2024-08-03 DIAGNOSIS — N186 End stage renal disease: Secondary | ICD-10-CM | POA: Diagnosis not present

## 2024-08-03 DIAGNOSIS — Z992 Dependence on renal dialysis: Secondary | ICD-10-CM | POA: Diagnosis not present

## 2024-08-03 DIAGNOSIS — N040 Nephrotic syndrome with minor glomerular abnormality: Secondary | ICD-10-CM | POA: Diagnosis not present

## 2024-08-04 ENCOUNTER — Other Ambulatory Visit: Payer: Self-pay | Admitting: Nurse Practitioner

## 2024-08-04 DIAGNOSIS — R52 Pain, unspecified: Secondary | ICD-10-CM | POA: Diagnosis not present

## 2024-08-04 DIAGNOSIS — N2581 Secondary hyperparathyroidism of renal origin: Secondary | ICD-10-CM | POA: Diagnosis not present

## 2024-08-04 DIAGNOSIS — D689 Coagulation defect, unspecified: Secondary | ICD-10-CM | POA: Diagnosis not present

## 2024-08-04 DIAGNOSIS — N186 End stage renal disease: Secondary | ICD-10-CM | POA: Diagnosis not present

## 2024-08-04 DIAGNOSIS — Z992 Dependence on renal dialysis: Secondary | ICD-10-CM | POA: Diagnosis not present

## 2024-08-05 ENCOUNTER — Other Ambulatory Visit: Payer: Self-pay | Admitting: Internal Medicine

## 2024-08-05 ENCOUNTER — Other Ambulatory Visit: Payer: Self-pay | Admitting: Nurse Practitioner

## 2024-08-05 DIAGNOSIS — K219 Gastro-esophageal reflux disease without esophagitis: Secondary | ICD-10-CM

## 2024-08-18 ENCOUNTER — Encounter: Payer: Self-pay | Admitting: Nurse Practitioner

## 2024-08-18 ENCOUNTER — Ambulatory Visit (INDEPENDENT_AMBULATORY_CARE_PROVIDER_SITE_OTHER): Payer: Medicare (Managed Care) | Admitting: Nurse Practitioner

## 2024-08-18 VITALS — BP 150/80 | HR 66 | Temp 97.1°F | Resp 16 | Ht 63.0 in | Wt 113.6 lb

## 2024-08-18 DIAGNOSIS — R630 Anorexia: Secondary | ICD-10-CM

## 2024-08-18 DIAGNOSIS — D631 Anemia in chronic kidney disease: Secondary | ICD-10-CM | POA: Diagnosis not present

## 2024-08-18 DIAGNOSIS — K5900 Constipation, unspecified: Secondary | ICD-10-CM

## 2024-08-18 DIAGNOSIS — E538 Deficiency of other specified B group vitamins: Secondary | ICD-10-CM | POA: Diagnosis not present

## 2024-08-18 DIAGNOSIS — E782 Mixed hyperlipidemia: Secondary | ICD-10-CM | POA: Diagnosis not present

## 2024-08-18 DIAGNOSIS — R634 Abnormal weight loss: Secondary | ICD-10-CM | POA: Diagnosis not present

## 2024-08-18 DIAGNOSIS — E559 Vitamin D deficiency, unspecified: Secondary | ICD-10-CM

## 2024-08-18 DIAGNOSIS — Z992 Dependence on renal dialysis: Secondary | ICD-10-CM

## 2024-08-18 DIAGNOSIS — N186 End stage renal disease: Secondary | ICD-10-CM

## 2024-08-18 DIAGNOSIS — R5383 Other fatigue: Secondary | ICD-10-CM

## 2024-08-18 MED ORDER — DRONABINOL 5 MG PO CAPS: ORAL_CAPSULE | ORAL | 2 refills | Status: DC

## 2024-08-18 MED ORDER — LUBIPROSTONE 24 MCG PO CAPS: 24.0000 ug | ORAL_CAPSULE | Freq: Two times a day (BID) | ORAL | 5 refills | Status: DC

## 2024-08-18 NOTE — Progress Notes (Signed)
 [facility] [address] [place], [place] [address]  Internal MEDICINE  Office Visit Note  Patient Name: Kristen Francis  [id number]  [id number]  Date of Service: 08/18/2024  Chief Complaint  Patient presents with   Acute Visit    Fatigue and constipation.     HPI Kristen Francis presents for an acute sick visit for fatigue and constipation Lack of appetite -- poor appetite esp on dialysis days.  Fatigue, weakness -- especially on days when she has dialysis  Constipation -- has tried OTC products including enemas    Current Medication:  Outpatient Encounter Medications as of 08/18/2024  Medication Sig   lubiprostone  (AMITIZA ) 24 MCG capsule Take 1 capsule (24 mcg total) by mouth 2 (two) times daily with a meal.   amLODipine  (NORVASC ) 10 MG tablet Take 10 mg by mouth daily.   aspirin  EC 81 MG EC tablet Take 1 tablet (81 mg total) by mouth daily. Swallow whole.   B Complex-C-Folic Acid  (RENAL-VITE) 0.8 MG TABS Take 0.8 mg by mouth every evening. On dialysis days   benzonatate  (TESSALON ) 100 MG capsule Take 1 capsule (100 mg total) by mouth 2 (two) times daily as needed for cough.   carvedilol  (COREG ) 25 MG tablet TAKE 1 TABLET(25 MG) BY MOUTH TWICE DAILY WITH A MEAL   cholecalciferol  (VITAMIN D ) 1000 units tablet Take 1,000 Units by mouth daily.    cinacalcet  (SENSIPAR ) 60 MG tablet Take 120 mg by mouth daily with supper.   cyanocobalamin  (VITAMIN B12) 1000 MCG tablet Take 1,000 mcg by mouth.   dronabinol  (MARINOL ) 5 MG capsule Take 1 capsule by mouth before meals twice daily on non-dialysis days   FEROSUL 325 (65 Fe) MG tablet TAKE 1 TABLET BY MOUTH TWICE DAILY WITH A MEAL   furosemide  (LASIX ) 80 MG tablet Take 1 tablet (80 mg total) by mouth 4 (four) times a week. On non-dialysis days   hydrALAZINE  (APRESOLINE ) 50 MG tablet TAKE 1 TABLET(50 MG) BY MOUTH THREE TIMES DAILY   lidocaine -prilocaine  (EMLA ) cream Apply 1 Application topically as directed.   omeprazole   (PRILOSEC) 40 MG capsule TAKE ONE CAPSULE BY MOUTH DAILY   predniSONE  (DELTASONE ) 10 MG tablet Take one tab by mouth  3 x daily for 3 days, then take one tab 2 x daily for 3 days and then take one tab daily for 3 days then stop   rosuvastatin  (CRESTOR ) 10 MG tablet TAKE 1 TABLET BY MOUTH ON NON-DIALYSIS DAYS   sacubitril -valsartan  (ENTRESTO ) 49-51 MG Take 1 tablet by mouth every 12 (twelve) hours.   sennosides-docusate sodium  (SENOKOT-S) 8.6-50 MG tablet Take 2 tablets by mouth daily.   sevelamer  carbonate (RENVELA ) 800 MG tablet Take 1,600 mg by mouth 3 (three) times daily with meals.   [DISCONTINUED] dronabinol  (MARINOL ) 5 MG capsule Take 1 capsule by mouth before meals twice daily on non-dialysis days   [DISCONTINUED] rosuvastatin  (CRESTOR ) 5 MG tablet Take 5 mg by mouth See admin instructions. (Take on non-dialysis days)   No facility-administered encounter medications on file as of 08/18/2024.      Medical History: Past Medical History:  Diagnosis Date   Anemia    Arthritis    Chronic kidney disease    ESRD   Complication of anesthesia    had procedure and felt incision early 80s   Gout    Hyperlipemia    Hypertension    Macular degeneration, right eye    Stroke (HCC)      Vital Signs: BP (!) 150/80  Pulse 66   Temp (!) 97.1 F (36.2 C)   Resp 16   Ht 5' 3 (1.6 m)   Wt 113 lb 9.6 oz (51.5 kg)   SpO2 96%   BMI 20.12 kg/m    Review of Systems  Constitutional:  Positive for activity change, appetite change and fatigue. Negative for chills and unexpected weight change.  HENT:  Negative for congestion, postnasal drip, rhinorrhea, sneezing and sore throat.   Eyes:  Negative for redness.  Respiratory:  Negative for cough, chest tightness, shortness of breath and wheezing.   Cardiovascular:  Negative for chest pain and palpitations.  Gastrointestinal:  Negative for abdominal pain, constipation, diarrhea, nausea and vomiting.  Genitourinary:  Negative for dysuria and  frequency.  Musculoskeletal:  Negative for arthralgias, back pain, joint swelling and neck pain.  Skin:  Negative for rash.  Neurological:  Positive for weakness. Negative for tremors and numbness.  Hematological:  Negative for adenopathy. Does not bruise/bleed easily.  Psychiatric/Behavioral:  Negative for behavioral problems (Depression), sleep disturbance and suicidal ideas. The patient is not nervous/anxious.     Physical Exam Vitals reviewed.  Constitutional:      General: She is not in acute distress.    Appearance: Normal appearance. She is normal weight. She is not ill-appearing.  HENT:     Head: Normocephalic and atraumatic.  Eyes:     Pupils: Pupils are equal, round, and reactive to light.  Cardiovascular:     Rate and Rhythm: Normal rate and regular rhythm.  Pulmonary:     Effort: Pulmonary effort is normal. No respiratory distress.  Skin:    General: Skin is warm and dry.  Neurological:     Mental Status: She is alert.     Motor: Weakness present.  Psychiatric:        Mood and Affect: Mood normal.        Behavior: Behavior normal.       Assessment/Plan: 1. Constipation, unspecified constipation type (Primary) Lubiprostone  prescribed. Routine labs ordered  - lubiprostone  (AMITIZA ) 24 MCG capsule; Take 1 capsule (24 mcg total) by mouth 2 (two) times daily with a meal.  Dispense: 60 capsule; Refill: 5 - CBC with Differential/Platelet - CMP14+EGFR - Lipid Profile - B12 and Folate Panel - Vitamin D  (25 hydroxy) - Hgb A1C w/o eAG  2. Anemia in chronic kidney disease, on chronic dialysis (HCC) Routine labs ordered  - CBC with Differential/Platelet - CMP14+EGFR - Lipid Profile - B12 and Folate Panel - Vitamin D  (25 hydroxy) - Hgb A1C w/o eAG  3. Other fatigue Routine labs ordered  - CBC with Differential/Platelet - CMP14+EGFR - Lipid Profile - B12 and Folate Panel - Vitamin D  (25 hydroxy) - Hgb A1C w/o eAG  4. Poor appetite Try to get dronabinol   approved by insurance to help stimulate her appetite. Routine labs ordered  - dronabinol  (MARINOL ) 5 MG capsule; Take 1 capsule by mouth before meals twice daily on non-dialysis days  Dispense: 40 capsule; Refill: 2 - CBC with Differential/Platelet - CMP14+EGFR - Lipid Profile - B12 and Folate Panel - Vitamin D  (25 hydroxy) - Hgb A1C w/o eAG  5. Unintentional weight loss Try to get dronabinol  approved by insurance to help stimulate her appetite. Routine labs ordered  - dronabinol  (MARINOL ) 5 MG capsule; Take 1 capsule by mouth before meals twice daily on non-dialysis days  Dispense: 40 capsule; Refill: 2 - CBC with Differential/Platelet - CMP14+EGFR - Lipid Profile - B12 and Folate Panel - Vitamin D  (25  hydroxy) - Hgb A1C w/o eAG  6. Mixed hyperlipidemia Routine labs ordered  - CBC with Differential/Platelet - CMP14+EGFR - Lipid Profile - B12 and Folate Panel - Vitamin D  (25 hydroxy) - Hgb A1C w/o eAG  7. B12 deficiency Routine labs ordered  - CBC with Differential/Platelet - CMP14+EGFR - Lipid Profile - B12 and Folate Panel - Vitamin D  (25 hydroxy) - Hgb A1C w/o eAG  8. Vitamin D  deficiency Routine labs ordered  - CBC with Differential/Platelet - CMP14+EGFR - Lipid Profile - B12 and Folate Panel - Vitamin D  (25 hydroxy) - Hgb A1C w/o eAG   General Counseling: Lillianne verbalizes understanding of the findings of todays visit and agrees with plan of treatment. I have discussed any further diagnostic evaluation that may be needed or ordered today. We also reviewed her medications today. she has been encouraged to call the office with any questions or concerns that should arise related to todays visit.    Counseling:    Orders Placed This Encounter  Procedures   CBC with Differential/Platelet   CMP14+EGFR   Lipid Profile   B12 and Folate Panel   Vitamin D  (25 hydroxy)   Hgb A1C w/o eAG    Meds ordered this encounter  Medications   lubiprostone  (AMITIZA ) 24  MCG capsule    Sig: Take 1 capsule (24 mcg total) by mouth 2 (two) times daily with a meal.    Dispense:  60 capsule    Refill:  5    Fill new script today asap   dronabinol  (MARINOL ) 5 MG capsule    Sig: Take 1 capsule by mouth before meals twice daily on non-dialysis days    Dispense:  40 capsule    Refill:  2    Return for previously scheduled, [facility], Yanira Tolsma PCP at the end of september..  [place] Controlled Substance Database was reviewed by me for overdose risk score (ORS)  Time spent:30 Minutes Time spent with patient included reviewing progress notes, labs, imaging studies, and discussing plan for follow up.   This patient was seen by Mardy Maxin, FNP-C in collaboration with Dr. Sigrid Bathe as a part of collaborative care agreement.  Tonesha Tsou R. Maxin, MSN, FNP-C Internal Medicine

## 2024-08-20 ENCOUNTER — Encounter: Payer: Self-pay | Admitting: Nurse Practitioner

## 2024-08-25 ENCOUNTER — Telehealth: Payer: Self-pay

## 2024-08-25 NOTE — Telephone Encounter (Signed)
 Lmom to call us  back regarding pt left message about her medication

## 2024-08-26 ENCOUNTER — Emergency Department
Admission: EM | Admit: 2024-08-26 | Discharge: 2024-08-26 | Disposition: A | Discharge status: Home / Self Care | Payer: Medicare (Managed Care) | Attending: Emergency Medicine | Admitting: Emergency Medicine

## 2024-08-26 ENCOUNTER — Other Ambulatory Visit: Payer: Self-pay

## 2024-08-26 ENCOUNTER — Emergency Department: Payer: Medicare (Managed Care)

## 2024-08-26 DIAGNOSIS — Z992 Dependence on renal dialysis: Secondary | ICD-10-CM | POA: Insufficient documentation

## 2024-08-26 DIAGNOSIS — I120 Hypertensive chronic kidney disease with stage 5 chronic kidney disease or end stage renal disease: Secondary | ICD-10-CM | POA: Diagnosis not present

## 2024-08-26 DIAGNOSIS — K5900 Constipation, unspecified: Secondary | ICD-10-CM | POA: Diagnosis not present

## 2024-08-26 DIAGNOSIS — N186 End stage renal disease: Secondary | ICD-10-CM | POA: Insufficient documentation

## 2024-08-26 DIAGNOSIS — D649 Anemia, unspecified: Secondary | ICD-10-CM | POA: Diagnosis not present

## 2024-08-26 DIAGNOSIS — R0602 Shortness of breath: Secondary | ICD-10-CM | POA: Insufficient documentation

## 2024-08-26 LAB — CBC
HCT: 30.3 % — ABNORMAL LOW (ref 36.0–46.0)
Hemoglobin: 9.5 g/dL — ABNORMAL LOW (ref 12.0–15.0)
MCH: 28.1 pg (ref 26.0–34.0)
MCHC: 31.4 g/dL (ref 30.0–36.0)
MCV: 89.6 fL (ref 80.0–100.0)
Platelets: 146 K/uL — ABNORMAL LOW (ref 150–400)
RBC: 3.38 MIL/uL — ABNORMAL LOW (ref 3.87–5.11)
RDW: 15.5 % (ref 11.5–15.5)
WBC: 4 K/uL (ref 4.0–10.5)
nRBC: 0 % (ref 0.0–0.2)

## 2024-08-26 LAB — BASIC METABOLIC PANEL WITH GFR
Anion gap: 14 (ref 5–15)
BUN: 41 mg/dL — ABNORMAL HIGH (ref 8–23)
CO2: 29 mmol/L (ref 22–32)
Calcium: 9.7 mg/dL (ref 8.9–10.3)
Chloride: 96 mmol/L — ABNORMAL LOW (ref 98–111)
Creatinine, Ser: 7.48 mg/dL — ABNORMAL HIGH (ref 0.44–1.00)
GFR, Estimated: 5 mL/min — ABNORMAL LOW (ref >60)
Glucose, Bld: 95 mg/dL (ref 70–99)
Potassium: 3.6 mmol/L (ref 3.5–5.1)
Sodium: 139 mmol/L (ref 135–145)

## 2024-08-26 LAB — HEPATIC FUNCTION PANEL
ALT: 6 U/L (ref 0–44)
AST: 14 U/L — ABNORMAL LOW (ref 15–41)
Albumin: 3.4 g/dL — ABNORMAL LOW (ref 3.5–5.0)
Alkaline Phosphatase: 38 U/L (ref 38–126)
Bilirubin, Direct: <0.1 mg/dL (ref 0.0–0.2)
Total Bilirubin: 0.5 mg/dL (ref 0.0–1.2)
Total Protein: 6.8 g/dL (ref 6.5–8.1)

## 2024-08-26 LAB — TROPONIN I (HIGH SENSITIVITY)
Troponin I (High Sensitivity): 25 ng/L — ABNORMAL HIGH (ref <18)
Troponin I (High Sensitivity): 23 ng/L — ABNORMAL HIGH (ref <18)

## 2024-08-26 MED ORDER — PANTOPRAZOLE SODIUM 40 MG IV SOLR: 40.0000 mg | Freq: Once | INTRAVENOUS | Status: DC

## 2024-08-26 NOTE — ED Triage Notes (Signed)
 Pt sts that she has been having SOB with fatigue for the last three weeks. Pt sts that she also have been constipated.

## 2024-08-26 NOTE — ED Provider Notes (Addendum)
 [facility] Provider Note    Event Date/Time   First MD Initiated Contact with Patient 08/26/24 1811     (approximate)   History   Shortness of Breath   HPI  Kristen Francis is a 78 y.o. female with ESRD on hemodialysis Monday Wednesday Friday, hypertension, hyperlipidemia who comes in with concerns for shortness of breath.  I reviewed a note from 06/17/2024 for patient has a reduced EF of 35 to 40%, moderate LVH, severe pulmonary hypertension moderate pericardial effusion, moderate MR/AR.  She has undergone a catheterization on 10/2023 with normal coronaries.  She had a repeat echo in March 2025 that showed EF improved to 45%.  It with a small pericardial effusion.   Patient denies any new swelling in her legs.  She denies any abdominal pain.  She just reports intermittent shortness of breath over the past 3 weeks.  She denies any significant shortness of breath right now.  She reports that it comes and goes.  She denies any chest pain.  She does report that she was feeling constipated and she took a bunch of stool softeners and that her she had some diarrhea today that was dark in nature.  She does report that she is on iron  supplementation.  So her stool is already dark to begin with.  She denies any additional dark stools and denies any abdominal pain.  Denies any history of blood clots.   Physical Exam   Triage Vital Signs: ED Triage Vitals  Encounter Vitals Group     BP 08/26/24 1617 121/69     Girls Systolic BP Percentile --      Girls Diastolic BP Percentile --      Boys Systolic BP Percentile --      Boys Diastolic BP Percentile --      Pulse Rate 08/26/24 1617 79     Resp 08/26/24 1617 18     Temp 08/26/24 1617 98.3 F (36.8 C)     Temp src --      SpO2 08/26/24 1617 94 %     Weight 08/26/24 1613 113 lb (51.3 kg)     Height 08/26/24 1613 5' 3 (1.6 m)     Head Circumference --      Peak Flow --      Pain Score 08/26/24 1613 8      Pain Loc --      Pain Education --      Exclude from Growth Chart --     Most recent vital signs: Vitals:   08/26/24 1617  BP: 121/69  Pulse: 79  Resp: 18  Temp: 98.3 F (36.8 C)  SpO2: 94%     General: Awake, no distress.  CV:  Good peripheral perfusion.  Resp:  Normal effort.  Clear lungs Abd:  No distention.  Soft and nontender Other:  No swelling in legs.  No calf tenderness No significant stool in the vault  ED Results / Procedures / Treatments   Labs (all labs ordered are listed, but only abnormal results are displayed) Labs Reviewed  BASIC METABOLIC PANEL WITH GFR - Abnormal; Notable for the following components:      Result Value   Chloride 96 (*)    BUN 41 (*)    Creatinine, Ser 7.48 (*)    GFR, Estimated 5 (*)    All other components within normal limits  CBC - Abnormal; Notable for the following components:   RBC 3.38 (*)  Hemoglobin 9.5 (*)    HCT 30.3 (*)    Platelets 146 (*)    All other components within normal limits  TROPONIN I (HIGH SENSITIVITY) - Abnormal; Notable for the following components:   Troponin I (High Sensitivity) 25 (*)    All other components within normal limits  TROPONIN I (HIGH SENSITIVITY)     EKG  My interpretation of EKG:  Looks like sinus rhythm rate of 78 without any ST elevation, T wave inversion in V6,  Normal sinus rate of 69 without any ST elevation or T wave inversions, normal intervals  RADIOLOGY I have reviewed the xray personally and interpreted no pneumonia   PROCEDURES:  Critical Care performed: No  Procedures   MEDICATIONS ORDERED IN ED: Medications - No data to display   IMPRESSION / MDM / ASSESSMENT AND PLAN / ED COURSE  I reviewed the triage vital signs and the nursing notes.   Patient's presentation is most consistent with acute presentation with potential threat to life or bodily function.  Patient comes in with shortness of breath she has been here multiple times previously with  fluid overload but today she looks pretty euvolemic her x-ray without evidence of any effusions.  Troponin was slightly elevated but similar to prior with known ESRD.  BMP shows elevated creatinine but potassium is normal at 3.6.  Her CBC does show hemoglobin of 9.5 it does appear that she fluctuates  Her repeat troponin was flat.  Her initial EKG was being read as atrial flutter but it looks more like sinus to me and I got a repeat and it was in sinus.  Also on the cardiac monitor it has been sinus in nature.  She is resting comfortably without any evidence of DVT her oxygen levels are 100% she really denies any shortness of breath at this time.  Her abdomen is soft and nontender low suspicion for acute abdominal process.  She I did do a rectal exam where there was no stool in the vault.  Her hemoglobin was slightly lower than her prior and I did discuss the case with Dr. Onita about the possibility of GI bleed but given patient reported dark stools and being on iron  it is probably more likely related to that.  We discussed starting her on acid reducer out of precaution but she is not on any blood thinners and I do not see any obvious signs of GI bleed and she could have her hemoglobin is trended with her dialysis people.  Patient now stating that she just feels fatigued because she has not been really eating much.  She reports is not having any appetite and she is talk to her primary care doctor about this and they are try to get her on an appetite stimulant.  I did add on hepatic function assay since these have not been tested in over a year and these were normal.  Patient doing p.o. trial, ambulation trial but we discussed that she will need to follow-up with her primary care doctor to discuss this further.  Repeat abdominal exam remains soft and nontender.  She denies any pain she just states that she just does not feel hungry.  On repeat assessment she is resting comfortably in bed we discussed her  reassuring workup and she felt comfortable with discharge home and will return to the ER for worsening symptoms or any other concern  The patient is on the cardiac monitor to evaluate for evidence of arrhythmia and/or significant heart rate  changes.      FINAL CLINICAL IMPRESSION(S) / ED DIAGNOSES   Final diagnoses:  Shortness of breath  Constipation, unspecified constipation type  ESRD (end stage renal disease) on dialysis (HCC)  Anemia, unspecified type     Rx / DC Orders   ED Discharge Orders     None        Note:  This document was prepared using Dragon voice recognition software and may include unintentional dictation errors.   Ernest Ronal BRAVO, MD 08/26/24 2030    Ernest Ronal BRAVO, MD 08/26/24 2115

## 2024-08-26 NOTE — Discharge Instructions (Addendum)
 Your workup was reassuring your hemoglobin was slightly lower and you could have your dialysis team continue to trend this and recheck it.  You can see if they can take off a little bit of extra fluid but your x-ray today was reassuring and your blood work was reassuring so there is no indication for admission.  If you develop worsening symptoms, worsening abdominal pain you should return to the ER for repeat evaluation  You should call GI to make a follow-up appointment to discuss your constipation and to see if you need to have another colonoscopy or endoscopy.

## 2024-08-28 ENCOUNTER — Encounter: Payer: Self-pay | Admitting: Nurse Practitioner

## 2024-08-28 ENCOUNTER — Ambulatory Visit: Payer: Medicare (Managed Care) | Admitting: Nurse Practitioner

## 2024-08-28 VITALS — BP 107/56 | HR 67 | Temp 97.6°F | Resp 16 | Ht 63.0 in | Wt 115.6 lb

## 2024-08-28 DIAGNOSIS — N186 End stage renal disease: Secondary | ICD-10-CM

## 2024-08-28 DIAGNOSIS — R630 Anorexia: Secondary | ICD-10-CM | POA: Diagnosis not present

## 2024-08-28 DIAGNOSIS — R531 Weakness: Secondary | ICD-10-CM | POA: Diagnosis not present

## 2024-08-28 DIAGNOSIS — E43 Unspecified severe protein-calorie malnutrition: Secondary | ICD-10-CM

## 2024-08-28 DIAGNOSIS — D631 Anemia in chronic kidney disease: Secondary | ICD-10-CM | POA: Diagnosis not present

## 2024-08-28 DIAGNOSIS — K5900 Constipation, unspecified: Secondary | ICD-10-CM

## 2024-08-28 DIAGNOSIS — E1129 Type 2 diabetes mellitus with other diabetic kidney complication: Secondary | ICD-10-CM | POA: Diagnosis not present

## 2024-08-28 DIAGNOSIS — Z992 Dependence on renal dialysis: Secondary | ICD-10-CM | POA: Diagnosis not present

## 2024-08-28 MED ORDER — NEPRO/CARBSTEADY PO LIQD: 237.0000 mL | Freq: Two times a day (BID) | ORAL | 11 refills | Status: DC

## 2024-08-28 NOTE — Progress Notes (Signed)
 [facility] [address] [place], [place] [address]  Internal MEDICINE  Office Visit Note  Patient Name: Kristen Francis  [id number]  [id number]  Date of Service: 08/28/2024  Chief Complaint  Patient presents with   Hyperlipidemia   Hypertension   Follow-up    HPI Hollie presents for a follow-up visit for lack of appetite, weight loss, weakness, anemia, CKD and CHF.  Lack of appetite --- has only been eating 1 meal per day, usually breakfast and then she reports not being hungry the rest of the day. Insurance will not approve dronabinol . She cannot take mirtazapine  due to kidney function.  Generalized weakness Chronic anemia related to ESRD.  Heart failure. Type 2 diabetes     Current Medication: Outpatient Encounter Medications as of 08/28/2024  Medication Sig   Nutritional Supplements (FEEDING SUPPLEMENT, NEPRO CARB STEADY,) LIQD Take 237 mLs by mouth 2 (two) times daily with a meal.   amLODipine  (NORVASC ) 10 MG tablet Take 10 mg by mouth daily.   aspirin  EC 81 MG EC tablet Take 1 tablet (81 mg total) by mouth daily. Swallow whole.   B Complex-C-Folic Acid  (RENAL-VITE) 0.8 MG TABS Take 0.8 mg by mouth every evening. On dialysis days   benzonatate  (TESSALON ) 100 MG capsule Take 1 capsule (100 mg total) by mouth 2 (two) times daily as needed for cough.   carvedilol  (COREG ) 25 MG tablet TAKE 1 TABLET(25 MG) BY MOUTH TWICE DAILY WITH A MEAL   cholecalciferol  (VITAMIN D ) 1000 units tablet Take 1,000 Units by mouth daily.    cinacalcet  (SENSIPAR ) 60 MG tablet Take 120 mg by mouth daily with supper.   cyanocobalamin  (VITAMIN B12) 1000 MCG tablet Take 1,000 mcg by mouth.   dronabinol  (MARINOL ) 5 MG capsule Take 1 capsule by mouth before meals twice daily on non-dialysis days   FEROSUL 325 (65 Fe) MG tablet TAKE 1 TABLET BY MOUTH TWICE DAILY WITH A MEAL   furosemide  (LASIX ) 80 MG tablet Take 1 tablet (80 mg total) by mouth 4 (four) times a week. On non-dialysis days    hydrALAZINE  (APRESOLINE ) 50 MG tablet TAKE 1 TABLET(50 MG) BY MOUTH THREE TIMES DAILY   lidocaine -prilocaine  (EMLA ) cream Apply 1 Application topically as directed.   lubiprostone  (AMITIZA ) 24 MCG capsule Take 1 capsule (24 mcg total) by mouth 2 (two) times daily with a meal.   omeprazole  (PRILOSEC) 40 MG capsule TAKE ONE CAPSULE BY MOUTH DAILY   predniSONE  (DELTASONE ) 10 MG tablet Take one tab by mouth  3 x daily for 3 days, then take one tab 2 x daily for 3 days and then take one tab daily for 3 days then stop   rosuvastatin  (CRESTOR ) 10 MG tablet TAKE 1 TABLET BY MOUTH ON NON-DIALYSIS DAYS   sacubitril -valsartan  (ENTRESTO ) 49-51 MG Take 1 tablet by mouth every 12 (twelve) hours.   sennosides-docusate sodium  (SENOKOT-S) 8.6-50 MG tablet Take 2 tablets by mouth daily.   sevelamer  carbonate (RENVELA ) 800 MG tablet Take 1,600 mg by mouth 3 (three) times daily with meals.   No facility-administered encounter medications on file as of 08/28/2024.    Surgical History: Past Surgical History:  Procedure Laterality Date   A/V FISTULAGRAM Left 12/25/2018   Procedure: A/V FISTULAGRAM;  Surgeon: Jama Cordella MATSU, MD;  Location: [facility] INVASIVE CV [facility];  Service: Cardiovascular;  Laterality: Left;   A/V FISTULAGRAM Left 10/09/2023   Procedure: A/V Fistulagram;  Surgeon: Jama Cordella MATSU, MD;  Location: [facility];  Service: Cardiovascular;  Laterality: Left;   ABDOMINAL HYSTERECTOMY     APPENDECTOMY     AV FISTULA PLACEMENT Left 07/12/2018   Procedure: INSERTION OF ARTERIOVENOUS (AV) GORE-TEX GRAFT ARM ( BRACHIAL AXILLARY );  Surgeon: Jama Cordella MATSU, MD;  Location: [facility] ORS;  Service: Vascular;  Laterality: Left;   COLONOSCOPY WITH PROPOFOL  N/A 01/14/2016   Procedure: COLONOSCOPY WITH PROPOFOL ;  Surgeon: Lamar ONEIDA Holmes, MD;  Location: [facility] ENDOSCOPY;  Service: Endoscopy;  Laterality: N/A;   COLONOSCOPY WITH PROPOFOL  N/A 02/14/2022   Procedure: COLONOSCOPY WITH PROPOFOL ;  Surgeon:  Maryruth Ole ONEIDA, MD;  Location: [facility] ENDOSCOPY;  Service: Endoscopy;  Laterality: N/A;   RIGHT/LEFT HEART CATH AND CORONARY ANGIOGRAPHY N/A 10/25/2023   Procedure: RIGHT/LEFT HEART CATH AND CORONARY ANGIOGRAPHY;  Surgeon: Ammon Blunt, MD;  Location: [facility] INVASIVE CV LAB;  Service: Cardiovascular;  Laterality: N/A;   TONSILLECTOMY      Medical History: Past Medical History:  Diagnosis Date   Anemia    Arthritis    Chronic kidney disease    ESRD   Complication of anesthesia    had procedure and felt incision early 80s   Gout    Hyperlipemia    Hypertension    Macular degeneration, right eye    Stroke (HCC)     Family History: Family History  Problem Relation Age of Onset   Kidney disease Mother    Breast cancer Neg Hx     Social History   Socioeconomic History   Marital status: Married    Spouse name: Not on file   Number of children: Not on file   Years of education: Not on file   Highest education level: Not on file  Occupational History   Not on file  Tobacco Use   Smoking status: Former   Smokeless tobacco: Never  Vaping Use   Vaping status: Never Used  Substance and Sexual Activity   Alcohol use: No   Drug use: No   Sexual activity: Not on file  Other Topics Concern   Not on file  Social History Narrative   Lives in  [place]; used to clean; no smoking; no alcohol.    Social Drivers of Corporate investment banker Strain: Not on file  Food Insecurity: No Food Insecurity (10/22/2023)   Hunger Vital Sign    Worried About Running Out of Food in the Last Year: Never true    Ran Out of Food in the Last Year: Never true  Transportation Needs: No Transportation Needs (10/22/2023)   PRAPARE - Administrator, Civil Service (Medical): No    Lack of Transportation (Non-Medical): No  Physical Activity: Not on file  Stress: Not on file  Social Connections: Not on file  Intimate Partner Violence: Not At Risk (10/22/2023)   Humiliation,  Afraid, Rape, and Kick questionnaire    Fear of Current or Ex-Partner: No    Emotionally Abused: No    Physically Abused: No    Sexually Abused: No      Review of Systems  Constitutional:  Positive for activity change, appetite change and fatigue. Negative for chills and unexpected weight change.  HENT:  Negative for congestion, postnasal drip, rhinorrhea, sneezing and sore throat.   Eyes:  Negative for redness.  Respiratory:  Negative for cough, chest tightness, shortness of breath and wheezing.   Cardiovascular:  Negative for chest pain and palpitations.  Gastrointestinal:  Negative for abdominal pain, constipation, diarrhea, nausea and vomiting.  Genitourinary:  Negative for dysuria and frequency.  Musculoskeletal:  Negative for arthralgias, back pain, joint swelling and neck pain.  Skin:  Negative for rash.  Neurological:  Positive for weakness. Negative for tremors and numbness.  Hematological:  Negative for adenopathy. Does not bruise/bleed easily.  Psychiatric/Behavioral:  Negative for behavioral problems (Depression), sleep disturbance and suicidal ideas. The patient is not nervous/anxious.     Vital Signs: BP (!) 107/56   Pulse 67   Temp 97.6 F (36.4 C)   Resp 16   Ht 5' 3 (1.6 m)   Wt 115 lb 9.6 oz (52.4 kg)   SpO2 93%   BMI 20.48 kg/m    Physical Exam Vitals reviewed.  Constitutional:      General: She is not in acute distress.    Appearance: Normal appearance. She is normal weight. She is not ill-appearing.  HENT:     Head: Normocephalic and atraumatic.  Eyes:     Pupils: Pupils are equal, round, and reactive to light.  Cardiovascular:     Rate and Rhythm: Normal rate and regular rhythm.  Pulmonary:     Effort: Pulmonary effort is normal. No respiratory distress.  Skin:    General: Skin is warm and dry.  Neurological:     Mental Status: She is alert.     Motor: Weakness present.  Psychiatric:        Mood and Affect: Mood normal.        Behavior:  Behavior normal.        Assessment/Plan: 1. Lack of appetite (Primary) Try nepro nutritional supplements and referred to dietician - Nutritional Supplements (FEEDING SUPPLEMENT, NEPRO CARB STEADY,) LIQD; Take 237 mLs by mouth 2 (two) times daily with a meal.  Dispense: 14220 mL; Refill: 11 - Amb ref to Medical Nutrition Therapy-MNT  2. Severe protein-energy malnutrition Try nepro nutritional supplements and referred to dietician - Nutritional Supplements (FEEDING SUPPLEMENT, NEPRO CARB STEADY,) LIQD; Take 237 mLs by mouth 2 (two) times daily with a meal.  Dispense: 14220 mL; Refill: 11 - Amb ref to Medical Nutrition Therapy-MNT  3. ESRD (end stage renal disease) on dialysis (HCC) Try nepro nutritional supplements and referred to dietician - Nutritional Supplements (FEEDING SUPPLEMENT, NEPRO CARB STEADY,) LIQD; Take 237 mLs by mouth 2 (two) times daily with a meal.  Dispense: 14220 mL; Refill: 11 - Amb ref to Medical Nutrition Therapy-MNT  4. Type 2 diabetes mellitus with other diabetic kidney complication (HCC) Try nepro nutritional supplements and referred to dietician - Nutritional Supplements (FEEDING SUPPLEMENT, NEPRO CARB STEADY,) LIQD; Take 237 mLs by mouth 2 (two) times daily with a meal.  Dispense: 14220 mL; Refill: 11 - Amb ref to Medical Nutrition Therapy-MNT  5. Anemia in chronic kidney disease, on chronic dialysis (HCC) Continue to follow up with nephrology and hemodialysis   6. Constipation, unspecified constipation type She has 2 prescriptions of different medications. Instructed to try docusate-sennosides first and then try lubiprostone  if that does not work.   7. Generalized weakness Due to comorbid conditions and hemodialysis    General Counseling: Chanita verbalizes understanding of the findings of todays visit and agrees with plan of treatment. I have discussed any further diagnostic evaluation that may be needed or ordered today. We also reviewed her  medications today. she has been encouraged to call the office with any questions or concerns that should arise related to todays visit.    Orders Placed This Encounter  Procedures   Amb ref to Medical Nutrition Therapy-MNT    Meds ordered this encounter  Medications   Nutritional Supplements (FEEDING SUPPLEMENT, NEPRO CARB STEADY,) LIQD    Sig: Take 237 mLs by mouth 2 (two) times daily with a meal.    Dispense:  14220 mL    Refill:  11    Fill new script today    Return for has AWV next week.   Total time spent:30 Minutes Time spent includes review of chart, medications, test results, and follow up plan with the patient.   [place] Controlled Substance Database was reviewed by me.  This patient was seen by Mardy Maxin, FNP-C in collaboration with Dr. Sigrid Bathe as a part of collaborative care agreement.   Sherina Stammer R. Maxin, MSN, FNP-C Internal medicine

## 2024-08-31 ENCOUNTER — Encounter: Payer: Self-pay | Admitting: Nurse Practitioner

## 2024-09-02 ENCOUNTER — Ambulatory Visit (INDEPENDENT_AMBULATORY_CARE_PROVIDER_SITE_OTHER): Payer: Medicare (Managed Care) | Admitting: Nurse Practitioner

## 2024-09-02 ENCOUNTER — Encounter: Payer: Self-pay | Admitting: Nurse Practitioner

## 2024-09-02 VITALS — BP 120/60 | HR 68 | Temp 97.3°F | Resp 16 | Ht 63.0 in | Wt 114.4 lb

## 2024-09-02 DIAGNOSIS — D631 Anemia in chronic kidney disease: Secondary | ICD-10-CM | POA: Diagnosis not present

## 2024-09-02 DIAGNOSIS — Z0000 Encounter for general adult medical examination without abnormal findings: Secondary | ICD-10-CM | POA: Diagnosis not present

## 2024-09-02 DIAGNOSIS — Z992 Dependence on renal dialysis: Secondary | ICD-10-CM

## 2024-09-02 DIAGNOSIS — M810 Age-related osteoporosis without current pathological fracture: Secondary | ICD-10-CM | POA: Diagnosis not present

## 2024-09-02 DIAGNOSIS — Z1231 Encounter for screening mammogram for malignant neoplasm of breast: Secondary | ICD-10-CM

## 2024-09-02 DIAGNOSIS — E1129 Type 2 diabetes mellitus with other diabetic kidney complication: Secondary | ICD-10-CM | POA: Diagnosis not present

## 2024-09-02 DIAGNOSIS — R5383 Other fatigue: Secondary | ICD-10-CM | POA: Diagnosis not present

## 2024-09-02 DIAGNOSIS — K5900 Constipation, unspecified: Secondary | ICD-10-CM

## 2024-09-02 DIAGNOSIS — R531 Weakness: Secondary | ICD-10-CM | POA: Diagnosis not present

## 2024-09-02 DIAGNOSIS — N186 End stage renal disease: Secondary | ICD-10-CM

## 2024-09-02 DIAGNOSIS — N040 Nephrotic syndrome with minor glomerular abnormality: Secondary | ICD-10-CM | POA: Diagnosis not present

## 2024-09-02 NOTE — Progress Notes (Signed)
 [facility] [address] [place], [place] [address]  Internal MEDICINE  Office Visit Note  Patient Name: Kristen Francis  [id number]  [id number]  Date of Service: 09/02/2024  Chief Complaint  Patient presents with   Hyperlipidemia   Hypertension   Follow-up    HPI Tequia presents for a medicare annual wellness visit.  Well-appearing 78 y.o. female with end-stage renal disease on dialysis, chronic anemia, hypertension, diabetes, secondary hyperparathyroidism, GERD, aortic atherosclerosis, peripheral vascular disease, and a history of stroke and TIA.  Routine CRC screening: due in 2028 Routine mammogram: due in November this year  DEXA scan: done in January this year  Eye exam: went to [facility] this year per patient report.  foot exam: done today.  Labs: has lab slip, forgot to get labs done.  New or worsening pain: none  Other concerns: none  Still reports fatigue and poor appetite. Has not hear from pharmacy about nepro nutritional supplement, reminded patient to ask her pharmacy about them.  The sennoside medication did not help with her constipation, patient instructed to try the lubiprostone  instead and see if that gives her better results.      09/02/2024    8:20 AM 05/24/2023    9:45 AM 04/12/2022   10:10 AM  MMSE - Mini Mental State Exam  Orientation to time 5 5 5   Orientation to Place 5 5 5   Registration 3 3 3   Attention/ Calculation 5 5 5   Recall 3 3 3   Language- name 2 objects 2 2 2   Language- repeat 1 1 1   Language- follow 3 step command 3 3 3   Language- read & follow direction 1 1 1   Write a sentence 0 0 1  Copy design 1 1 1   Total score 29 29 30     Functional Status Survey: Is the patient deaf or have difficulty hearing?: No Does the patient have difficulty seeing, even when wearing glasses/contacts?: No Does the patient have difficulty concentrating, remembering, or making decisions?: No Does the patient have difficulty  walking or climbing stairs?: Yes Does the patient have difficulty dressing or bathing?: No Does the patient have difficulty doing errands alone such as visiting a doctor's office or shopping?: No     04/12/2022   10:08 AM 09/06/2022   10:48 AM 12/11/2022   11:20 AM 05/24/2023    9:43 AM 09/02/2024    8:19 AM  Fall Risk  Falls in the past year? 1 1 0 1 0  Was there an injury with Fall? 0 0 0 0 0  Fall Risk Category Calculator 2 1 0 1 0  Fall Risk Category (Retired) Moderate  Low  Low     (RETIRED) Patient Fall Risk Level Moderate fall risk  Low fall risk  Low fall risk     Patient at Risk for Falls Due to  Impaired balance/gait No Fall Risks  No Fall Risks  Fall risk Follow up Falls evaluation completed  Falls evaluation completed  Falls evaluation completed  Falls evaluation completed Falls evaluation completed     Data saved with a previous flowsheet row definition       09/02/2024    8:19 AM  Depression screen PHQ 2/9  Decreased Interest 0  Down, Depressed, Hopeless 0  PHQ - 2 Score 0        Current Medication: Outpatient Encounter Medications as of 09/02/2024  Medication Sig   amLODipine  (NORVASC ) 10 MG tablet Take 10 mg by mouth daily.  aspirin  EC 81 MG EC tablet Take 1 tablet (81 mg total) by mouth daily. Swallow whole.   B Complex-C-Folic Acid  (RENAL-VITE) 0.8 MG TABS Take 0.8 mg by mouth every evening. On dialysis days   benzonatate  (TESSALON ) 100 MG capsule Take 1 capsule (100 mg total) by mouth 2 (two) times daily as needed for cough.   carvedilol  (COREG ) 25 MG tablet TAKE 1 TABLET(25 MG) BY MOUTH TWICE DAILY WITH A MEAL   cholecalciferol  (VITAMIN D ) 1000 units tablet Take 1,000 Units by mouth daily.    cinacalcet  (SENSIPAR ) 60 MG tablet Take 120 mg by mouth daily with supper.   cyanocobalamin  (VITAMIN B12) 1000 MCG tablet Take 1,000 mcg by mouth.   dronabinol  (MARINOL ) 5 MG capsule Take 1 capsule by mouth before meals twice daily on non-dialysis days   FEROSUL 325  (65 Fe) MG tablet TAKE 1 TABLET BY MOUTH TWICE DAILY WITH A MEAL   furosemide  (LASIX ) 80 MG tablet Take 1 tablet (80 mg total) by mouth 4 (four) times a week. On non-dialysis days   hydrALAZINE  (APRESOLINE ) 50 MG tablet TAKE 1 TABLET(50 MG) BY MOUTH THREE TIMES DAILY   lidocaine -prilocaine  (EMLA ) cream Apply 1 Application topically as directed.   lubiprostone  (AMITIZA ) 24 MCG capsule Take 1 capsule (24 mcg total) by mouth 2 (two) times daily with a meal.   Nutritional Supplements (FEEDING SUPPLEMENT, NEPRO CARB STEADY,) LIQD Take 237 mLs by mouth 2 (two) times daily with a meal.   omeprazole  (PRILOSEC) 40 MG capsule TAKE ONE CAPSULE BY MOUTH DAILY   predniSONE  (DELTASONE ) 10 MG tablet Take one tab by mouth  3 x daily for 3 days, then take one tab 2 x daily for 3 days and then take one tab daily for 3 days then stop   rosuvastatin  (CRESTOR ) 10 MG tablet TAKE 1 TABLET BY MOUTH ON NON-DIALYSIS DAYS   sacubitril -valsartan  (ENTRESTO ) 49-51 MG Take 1 tablet by mouth every 12 (twelve) hours.   sennosides-docusate sodium  (SENOKOT-S) 8.6-50 MG tablet Take 2 tablets by mouth daily.   sevelamer  carbonate (RENVELA ) 800 MG tablet Take 1,600 mg by mouth 3 (three) times daily with meals.   No facility-administered encounter medications on file as of 09/02/2024.    Surgical History: Past Surgical History:  Procedure Laterality Date   A/V FISTULAGRAM Left 12/25/2018   Procedure: A/V FISTULAGRAM;  Surgeon: Jama Cordella MATSU, MD;  Location: [facility] INVASIVE CV [facility];  Service: Cardiovascular;  Laterality: Left;   A/V FISTULAGRAM Left 10/09/2023   Procedure: A/V Fistulagram;  Surgeon: Jama Cordella MATSU, MD;  Location: [facility] INVAS[facility] CV LAB;  Service: Cardiovascular;  Laterality: Left;   ABDOMINAL HYSTERECTOMY     APPENDECTOMY     AV FISTULA PLACEMENT Left 07/12/2018   Procedure: INSERTION OF ARTERIOVENOUS (AV) GORE-TEX GRAFT ARM ( BRACHIAL AXILLARY );  Surgeon: Jama Cordella MATSU, MD;  Location: [facility] ORS;  Service:  Vascular;  Laterality: Left;   COLONOSCOPY WITH PROPOFOL  N/A 01/14/2016   Procedure: COLONOSCOPY WITH PROPOFOL ;  Surgeon: Lamar ONEIDA Holmes, MD;  Location: [facility];  Service: Endoscopy;  Laterality: N/A;   COLONOSCOPY WITH PROPOFOL  N/A 02/14/2022   Procedure: COLONOSCOPY WITH PROPOFOL ;  Surgeon: Maryruth Ole ONEIDA, MD;  Location: [facility] ENDOSCOPY;  Service: Endoscopy;  Laterality: N/A;   RIGHT/LEFT HEART CATH AND CORONARY ANGIOGRAPHY N/A 10/25/2023   Procedure: RIGHT/LEFT HEART CATH AND CORONARY ANGIOGRAPHY;  Surgeon: Ammon Blunt, MD;  Location: [facility] INVASIVE CV LAB;  Service: Cardiovascular;  Laterality: N/A;   TONSILLECTOMY  Medical History: Past Medical History:  Diagnosis Date   Anemia    Arthritis    Chronic kidney disease    ESRD   Complication of anesthesia    had procedure and felt incision early 80s   Gout    Hyperlipemia    Hypertension    Macular degeneration, right eye    Stroke [facility])     Family History: Family History  Problem Relation Age of Onset   Kidney disease Mother    Breast cancer Neg Hx     Social History   Socioeconomic History   Marital status: Married    Spouse name: Not on file   Number of children: Not on file   Years of education: Not on file   Highest education level: Not on file  Occupational History   Not on file  Tobacco Use   Smoking status: Former   Smokeless tobacco: Never  Vaping Use   Vaping status: Never Used  Substance and Sexual Activity   Alcohol use: No   Drug use: No   Sexual activity: Not on file  Other Topics Concern   Not on file  Social History Narrative   Lives in  [place]; used to clean; no smoking; no alcohol.    Social Drivers of Corporate investment banker Strain: Not on file  Food Insecurity: No Food Insecurity (10/22/2023)   Hunger Vital Sign    Worried About Running Out of Food in the Last Year: Never true    Ran Out of Food in the Last Year: Never true  Transportation Needs: No  Transportation Needs (10/22/2023)   PRAPARE - Administrator, Civil Service (Medical): No    Lack of Transportation (Non-Medical): No  Physical Activity: Not on file  Stress: Not on file  Social Connections: Not on file  Intimate Partner Violence: Not At Risk (10/22/2023)   Humiliation, Afraid, Rape, and Kick questionnaire    Fear of Current or Ex-Partner: No    Emotionally Abused: No    Physically Abused: No    Sexually Abused: No      Review of Systems  Constitutional:  Positive for appetite change, fatigue (Reports this is typical the day after she has dialysis) and unexpected weight change. Negative for activity change, chills and fever.  HENT: Negative.  Negative for congestion, ear pain, rhinorrhea, sore throat and trouble swallowing.   Eyes: Negative.   Respiratory: Negative.  Negative for cough, chest tightness, shortness of breath and wheezing.   Cardiovascular: Negative.  Negative for chest pain and palpitations.  Gastrointestinal: Negative.  Negative for abdominal pain, blood in stool, constipation, diarrhea, nausea and vomiting.  Endocrine: Negative.   Genitourinary:  Positive for decreased urine volume. Negative for difficulty urinating, dysuria, frequency, hematuria, pelvic pain and urgency.       Patient has and arteriovenous graft on the left upper extremity  Musculoskeletal:  Positive for arthralgias. Negative for back pain, joint swelling, myalgias and neck pain.  Skin: Negative.  Negative for rash and wound.  Allergic/Immunologic: Negative.  Negative for immunocompromised state.  Neurological: Negative.  Negative for dizziness, seizures, numbness and headaches.  Hematological: Negative.   Psychiatric/Behavioral: Negative.  Negative for behavioral problems, self-injury and suicidal ideas. The patient is not nervous/anxious.     Vital Signs: BP 120/60   Pulse 68   Temp (!) 97.3 F (36.3 C)   Resp 16   Ht 5' 3 (1.6 m)   Wt 114 lb 6.4 oz (51.9 kg)  SpO2 97%   BMI 20.27 kg/m    Physical Exam Vitals reviewed.  Constitutional:      General: She is awake. She is not in acute distress.    Appearance: Normal appearance. She is well-developed, well-groomed and normal weight. She is not ill-appearing or diaphoretic.  HENT:     Head: Normocephalic and atraumatic.     Right Ear: Tympanic membrane, ear canal and external ear normal.     Left Ear: Tympanic membrane, ear canal and external ear normal.     Nose: Nose normal. No congestion or rhinorrhea.     Mouth/Throat:     Mouth: Mucous membranes are moist.     Pharynx: Oropharynx is clear. No oropharyngeal exudate or posterior oropharyngeal erythema.  Eyes:     General: No scleral icterus.       Right eye: No discharge.        Left eye: No discharge.     Extraocular Movements: Extraocular movements intact.     Conjunctiva/sclera: Conjunctivae normal.     Pupils: Pupils are equal, round, and reactive to light.  Neck:     Thyroid : No thyromegaly.     Vascular: No carotid bruit or JVD.     Trachea: No tracheal deviation.  Cardiovascular:     Rate and Rhythm: Normal rate and regular rhythm.     Pulses:          Dorsalis pedis pulses are 1+ on the right side and 1+ on the left side.       Posterior tibial pulses are 1+ on the right side and 1+ on the left side.     Heart sounds: Murmur heard.     No friction rub. No gallop.     Arteriovenous access: Left arteriovenous access is present.  Pulmonary:     Effort: Pulmonary effort is normal. No accessory muscle usage or respiratory distress.     Breath sounds: Normal breath sounds and air entry. No stridor. No wheezing or rales.  Chest:     Chest wall: No tenderness.     Comments: Declined clinical breast exam Abdominal:     General: Bowel sounds are normal. There is no distension.     Palpations: Abdomen is soft. There is no shifting dullness, fluid wave, mass or pulsatile mass.     Tenderness: There is no abdominal tenderness.  There is no guarding or rebound.  Musculoskeletal:        General: No tenderness or deformity. Normal range of motion.     Cervical back: Normal range of motion and neck supple.     Right lower leg: No edema.     Left lower leg: No edema.     Right foot: Normal range of motion. No deformity, bunion, Charcot foot, foot drop or prominent metatarsal heads.     Left foot: Normal range of motion. No deformity, bunion, Charcot foot, foot drop or prominent metatarsal heads.  Feet:     Right foot:     Protective Sensation: 6 sites tested.  6 sites sensed.     Skin integrity: Dry skin present. No ulcer, blister, skin breakdown, erythema, warmth, callus or fissure.     Toenail Condition: Right toenails are abnormally thick.     Left foot:     Protective Sensation: 6 sites tested.  6 sites sensed.     Skin integrity: Dry skin present. No ulcer, blister, skin breakdown, erythema, warmth, callus or fissure.     Toenail Condition: Left toenails  are abnormally thick.  Lymphadenopathy:     Cervical: No cervical adenopathy.  Skin:    General: Skin is warm and dry.     Capillary Refill: Capillary refill takes less than 2 seconds.     Coloration: Skin is not pale.     Findings: No erythema or rash.  Neurological:     Mental Status: She is alert and oriented to person, place, and time.     Cranial Nerves: No cranial nerve deficit.     Motor: No abnormal muscle tone.     Coordination: Coordination normal.     Gait: Gait normal.     Deep Tendon Reflexes: Reflexes are normal and symmetric.  Psychiatric:        Mood and Affect: Mood normal.        Behavior: Behavior normal. Behavior is cooperative.        Thought Content: Thought content normal.        Judgment: Judgment normal.        Assessment/Plan: 1. Encounter for subsequent annual wellness visit (AWV) in Medicare patient (Primary) Age-appropriate preventive screenings and vaccinations discussed. Routine labs for health maintenance  previously ordered, patient reminded to have labs drawn soon. PHM updated.    2. ESRD (end stage renal disease) on dialysis [facility]) Continue to follow up with dialysis center and nephrology  3. Type 2 diabetes mellitus with other diabetic kidney complication (HCC) Continue diet and medications as prescribed.   4. Anemia in chronic kidney disease, on chronic dialysis (HCC) Monitored by nephrology and the dialysis center  5. Age-related osteoporosis without current pathological fracture Noted on dexa scan. Not able to take any medications due to ESRD  6. Constipation, unspecified constipation type Patient instructed to try lubiprostone  twice daily instead as prescribed   7. Generalized weakness Continuous problem complicated by dialysis and comorbid conditions   8. Other fatigue Continuous problem complicated by dialysis and comorbid conditions   9. Encounter for screening mammogram for malignant neoplasm of breast Routine mammogram ordered  - MM 3D SCREENING MAMMOGRAM BILATERAL BREAST; Future      General Counseling: Ahyana verbalizes understanding of the findings of todays visit and agrees with plan of treatment. I have discussed any further diagnostic evaluation that may be needed or ordered today. We also reviewed her medications today. she has been encouraged to call the office with any questions or concerns that should arise related to todays visit.    Orders Placed This Encounter  Procedures   MM 3D SCREENING MAMMOGRAM BILATERAL BREAST    No orders of the defined types were placed in this encounter.   Return in about 4 months (around 01/02/2025) for F/U, Recheck A1C, Braeley Buskey PCP.   Total time spent:30 Minutes Time spent includes review of chart, medications, test results, and follow up plan with the patient.   [place] Controlled Substance Database was reviewed by me.  This patient was seen by Mardy Maxin, FNP-C in collaboration with Dr. Sigrid Bathe as a part of  collaborative care agreement.  Donnamaria Shands R. Maxin, MSN, FNP-C Internal medicine

## 2024-09-03 DIAGNOSIS — D689 Coagulation defect, unspecified: Secondary | ICD-10-CM | POA: Diagnosis not present

## 2024-09-03 DIAGNOSIS — N186 End stage renal disease: Secondary | ICD-10-CM | POA: Diagnosis not present

## 2024-09-03 DIAGNOSIS — Z992 Dependence on renal dialysis: Secondary | ICD-10-CM | POA: Diagnosis not present

## 2024-09-03 DIAGNOSIS — R519 Headache, unspecified: Secondary | ICD-10-CM | POA: Diagnosis not present

## 2024-09-03 DIAGNOSIS — N2581 Secondary hyperparathyroidism of renal origin: Secondary | ICD-10-CM | POA: Diagnosis not present

## 2024-09-03 DIAGNOSIS — D631 Anemia in chronic kidney disease: Secondary | ICD-10-CM | POA: Diagnosis not present

## 2024-09-03 LAB — CMP14+EGFR
ALT: 6 IU/L (ref 0–32)
AST: 13 IU/L (ref 0–40)
Albumin: 4.2 g/dL (ref 3.8–4.8)
Alkaline Phosphatase: 51 IU/L (ref 49–135)
BUN/Creatinine Ratio: 5 — ABNORMAL LOW (ref 12–28)
BUN: 35 mg/dL — ABNORMAL HIGH (ref 8–27)
Bilirubin Total: 0.2 mg/dL (ref 0.0–1.2)
CO2: 28 mmol/L (ref 20–29)
Calcium: 9.5 mg/dL (ref 8.7–10.3)
Chloride: 96 mmol/L (ref 96–106)
Creatinine, Ser: 7.16 mg/dL — ABNORMAL HIGH (ref 0.57–1.00)
Globulin, Total: 2.4 g/dL (ref 1.5–4.5)
Glucose: 93 mg/dL (ref 70–99)
Potassium: 4 mmol/L (ref 3.5–5.2)
Sodium: 142 mmol/L (ref 134–144)
Total Protein: 6.6 g/dL (ref 6.0–8.5)
eGFR: 5 mL/min/1.73 — ABNORMAL LOW (ref >59)

## 2024-09-03 LAB — CBC WITH DIFFERENTIAL/PLATELET
Basophils Absolute: 0.1 x10E3/uL (ref 0.0–0.2)
Basos: 2 %
EOS (ABSOLUTE): 0.1 x10E3/uL (ref 0.0–0.4)
Eos: 2 %
Hematocrit: 32.7 % — ABNORMAL LOW (ref 34.0–46.6)
Hemoglobin: 10.5 g/dL — ABNORMAL LOW (ref 11.1–15.9)
Immature Grans (Abs): 0 x10E3/uL (ref 0.0–0.1)
Immature Granulocytes: 0 %
Lymphocytes Absolute: 1 x10E3/uL (ref 0.7–3.1)
Lymphs: 25 %
MCH: 28.1 pg (ref 26.6–33.0)
MCHC: 32.1 g/dL (ref 31.5–35.7)
MCV: 87 fL (ref 79–97)
Monocytes Absolute: 0.4 x10E3/uL (ref 0.1–0.9)
Monocytes: 10 %
Neutrophils Absolute: 2.4 x10E3/uL (ref 1.4–7.0)
Neutrophils: 61 %
Platelets: 171 x10E3/uL (ref 150–450)
RBC: 3.74 x10E6/uL — ABNORMAL LOW (ref 3.77–5.28)
RDW: 14.8 % (ref 11.7–15.4)
WBC: 3.9 x10E3/uL (ref 3.4–10.8)

## 2024-09-03 LAB — LIPID PANEL
Chol/HDL Ratio: 2.9 ratio (ref 0.0–4.4)
Cholesterol, Total: 228 mg/dL — ABNORMAL HIGH (ref 100–199)
HDL: 78 mg/dL (ref >39)
LDL Chol Calc (NIH): 113 mg/dL — ABNORMAL HIGH (ref 0–99)
Triglycerides: 216 mg/dL — ABNORMAL HIGH (ref 0–149)
VLDL Cholesterol Cal: 37 mg/dL (ref 5–40)

## 2024-09-03 LAB — VITAMIN D 25 HYDROXY (VIT D DEFICIENCY, FRACTURES): Vit D, 25-Hydroxy: 24.3 ng/mL — ABNORMAL LOW (ref 30.0–100.0)

## 2024-09-03 LAB — B12 AND FOLATE PANEL
Folate: 9.2 ng/mL (ref >3.0)
Vitamin B-12: >2000 pg/mL — ABNORMAL HIGH (ref 232–1245)

## 2024-09-03 LAB — HGB A1C W/O EAG: Hgb A1c MFr Bld: 5 % (ref 4.8–5.6)

## 2024-10-03 DIAGNOSIS — N186 End stage renal disease: Secondary | ICD-10-CM | POA: Diagnosis not present

## 2024-10-03 DIAGNOSIS — N040 Nephrotic syndrome with minor glomerular abnormality: Secondary | ICD-10-CM | POA: Diagnosis not present

## 2024-10-03 DIAGNOSIS — Z992 Dependence on renal dialysis: Secondary | ICD-10-CM | POA: Diagnosis not present

## 2024-10-06 DIAGNOSIS — D689 Coagulation defect, unspecified: Secondary | ICD-10-CM | POA: Diagnosis not present

## 2024-10-06 DIAGNOSIS — N2581 Secondary hyperparathyroidism of renal origin: Secondary | ICD-10-CM | POA: Diagnosis not present

## 2024-10-06 DIAGNOSIS — D631 Anemia in chronic kidney disease: Secondary | ICD-10-CM | POA: Diagnosis not present

## 2024-10-06 DIAGNOSIS — Z992 Dependence on renal dialysis: Secondary | ICD-10-CM | POA: Diagnosis not present

## 2024-10-06 DIAGNOSIS — N186 End stage renal disease: Secondary | ICD-10-CM | POA: Diagnosis not present

## 2024-10-06 DIAGNOSIS — R52 Pain, unspecified: Secondary | ICD-10-CM | POA: Diagnosis not present

## 2024-10-21 ENCOUNTER — Ambulatory Visit: Payer: Medicare (Managed Care) | Admitting: Dietician

## 2024-10-24 ENCOUNTER — Other Ambulatory Visit: Payer: Self-pay | Admitting: Nurse Practitioner

## 2024-10-27 ENCOUNTER — Other Ambulatory Visit: Payer: Self-pay

## 2024-10-29 ENCOUNTER — Other Ambulatory Visit (INDEPENDENT_AMBULATORY_CARE_PROVIDER_SITE_OTHER): Payer: Self-pay | Admitting: Nurse Practitioner

## 2024-10-29 DIAGNOSIS — I6523 Occlusion and stenosis of bilateral carotid arteries: Secondary | ICD-10-CM

## 2024-10-29 DIAGNOSIS — N186 End stage renal disease: Secondary | ICD-10-CM

## 2024-11-02 DIAGNOSIS — Z992 Dependence on renal dialysis: Secondary | ICD-10-CM | POA: Diagnosis not present

## 2024-11-02 DIAGNOSIS — N040 Nephrotic syndrome with minor glomerular abnormality: Secondary | ICD-10-CM | POA: Diagnosis not present

## 2024-11-02 DIAGNOSIS — N186 End stage renal disease: Secondary | ICD-10-CM | POA: Diagnosis not present

## 2024-11-06 ENCOUNTER — Encounter (INDEPENDENT_AMBULATORY_CARE_PROVIDER_SITE_OTHER): Payer: Medicare (Managed Care)

## 2024-11-06 ENCOUNTER — Ambulatory Visit (INDEPENDENT_AMBULATORY_CARE_PROVIDER_SITE_OTHER): Payer: Medicare (Managed Care) | Admitting: Nurse Practitioner

## 2024-11-10 ENCOUNTER — Telehealth: Payer: Self-pay

## 2024-11-10 ENCOUNTER — Ambulatory Visit: Payer: Medicare (Managed Care) | Admitting: Nurse Practitioner

## 2024-11-10 ENCOUNTER — Encounter: Payer: Self-pay | Admitting: Nurse Practitioner

## 2024-11-10 VITALS — BP 96/60 | HR 70 | Temp 97.6°F | Resp 16 | Ht 63.0 in | Wt 111.2 lb

## 2024-11-10 DIAGNOSIS — E43 Unspecified severe protein-calorie malnutrition: Secondary | ICD-10-CM | POA: Diagnosis not present

## 2024-11-10 DIAGNOSIS — R630 Anorexia: Secondary | ICD-10-CM

## 2024-11-10 DIAGNOSIS — I5032 Chronic diastolic (congestive) heart failure: Secondary | ICD-10-CM | POA: Diagnosis not present

## 2024-11-10 DIAGNOSIS — E1129 Type 2 diabetes mellitus with other diabetic kidney complication: Secondary | ICD-10-CM | POA: Diagnosis not present

## 2024-11-10 DIAGNOSIS — I951 Orthostatic hypotension: Secondary | ICD-10-CM

## 2024-11-10 DIAGNOSIS — Z992 Dependence on renal dialysis: Secondary | ICD-10-CM

## 2024-11-10 DIAGNOSIS — N186 End stage renal disease: Secondary | ICD-10-CM | POA: Diagnosis not present

## 2024-11-10 MED ORDER — CINACALCET HCL 60 MG PO TABS: 120.0000 mg | ORAL_TABLET | Freq: Every day | ORAL | Status: AC

## 2024-11-10 MED ORDER — NEPRO/CARBSTEADY PO LIQD: 237.0000 mL | Freq: Two times a day (BID) | ORAL | 11 refills | Status: AC

## 2024-11-10 NOTE — Progress Notes (Unsigned)
 [facility] [address] [place], [place] [address]  Internal MEDICINE  Office Visit Note  Patient Name: Kristen Francis  [id number]  [id number]  Date of Service: 11/10/2024  Chief Complaint  Patient presents with   Acute Visit    Vertigo/ low bp     HPI [name] presents for an acute sick visit for vertigo and low BP Fatigue -- symptom of ESRD, and side effect of dialysis which she had this morning  No appetite -- symptom of ESRD and side effect of dialysis  Vertigo -- possibly due to orthostatic intolerance due to heart failure, ESRD and dialysis  Low BP -- symptom of heart failure, ESRD and dialysis      Current Medication:  Outpatient Encounter Medications as of 11/10/2024  Medication Sig   amLODipine  (NORVASC ) 10 MG tablet Take 10 mg by mouth daily.   aspirin  EC 81 MG EC tablet Take 1 tablet (81 mg total) by mouth daily. Swallow whole.   B Complex-C-Folic Acid  (RENAL-VITE) 0.8 MG TABS Take 0.8 mg by mouth every evening. On dialysis days   carvedilol  (COREG ) 25 MG tablet TAKE 1 TABLET(25 MG) BY MOUTH TWICE DAILY WITH A MEAL   cholecalciferol  (VITAMIN D ) 1000 units tablet Take 1,000 Units by mouth daily.    cinacalcet  (SENSIPAR ) 60 MG tablet Take 2 tablets (120 mg total) by mouth daily with supper.   FEROSUL 325 (65 Fe) MG tablet TAKE 1 TABLET BY MOUTH TWICE DAILY WITH A MEAL   hydrALAZINE  (APRESOLINE ) 50 MG tablet TAKE 1 TABLET(50 MG) BY MOUTH THREE TIMES DAILY   Nutritional Supplements (FEEDING SUPPLEMENT, NEPRO CARB STEADY,) LIQD Take 237 mLs by mouth 2 (two) times daily with a meal.   omeprazole  (PRILOSEC) 40 MG capsule TAKE ONE CAPSULE BY MOUTH DAILY   rosuvastatin  (CRESTOR ) 10 MG tablet TAKE 1 TABLET BY MOUTH ON NON-DIALYSIS DAYS   sacubitril -valsartan  (ENTRESTO ) 49-51 MG Take 1 tablet by mouth every 12 (twelve) hours.   sennosides-docusate sodium  (SENOKOT-S) 8.6-50 MG tablet Take 2 tablets by mouth daily.   sevelamer  carbonate (RENVELA ) 800 MG tablet  Take 1,600 mg by mouth 3 (three) times daily with meals.   [DISCONTINUED] benzonatate  (TESSALON ) 100 MG capsule Take 1 capsule (100 mg total) by mouth 2 (two) times daily as needed for cough. (Patient not taking: Reported on 11/10/2024)   [DISCONTINUED] cinacalcet  (SENSIPAR ) 60 MG tablet Take 120 mg by mouth daily with supper. (Patient not taking: Reported on 11/10/2024)   [DISCONTINUED] cyanocobalamin  (VITAMIN B12) 1000 MCG tablet Take 1,000 mcg by mouth. (Patient not taking: Reported on 11/10/2024)   [DISCONTINUED] dronabinol  (MARINOL ) 5 MG capsule Take 1 capsule by mouth before meals twice daily on non-dialysis days (Patient not taking: Reported on 11/10/2024)   [DISCONTINUED] furosemide  (LASIX ) 80 MG tablet Take 1 tablet (80 mg total) by mouth 4 (four) times a week. On non-dialysis days (Patient not taking: Reported on 11/10/2024)   [DISCONTINUED] lidocaine -prilocaine  (EMLA ) cream Apply 1 Application topically as directed. (Patient not taking: Reported on 11/10/2024)   [DISCONTINUED] lubiprostone  (AMITIZA ) 24 MCG capsule Take 1 capsule (24 mcg total) by mouth 2 (two) times daily with a meal. (Patient not taking: Reported on 11/10/2024)   [DISCONTINUED] Nutritional Supplements (FEEDING SUPPLEMENT, NEPRO CARB STEADY,) LIQD Take 237 mLs by mouth 2 (two) times daily with a meal. (Patient not taking: Reported on 11/10/2024)   [DISCONTINUED] predniSONE  (DELTASONE ) 10 MG tablet Take one tab by mouth  3 x daily for 3 days, then take one tab 2  x daily for 3 days and then take one tab daily for 3 days then stop (Patient not taking: Reported on 11/10/2024)   No facility-administered encounter medications on file as of 11/10/2024.      Medical History: Past Medical History:  Diagnosis Date   Anemia    Arthritis    Chronic kidney disease    ESRD   Complication of anesthesia    had procedure and felt incision early 80s   Gout    Hyperlipemia    Hypertension    Macular degeneration, right eye    Stroke (HCC)       Vital Signs: BP 96/60   Pulse 70   Temp 97.6 F (36.4 C)   Resp 16   Ht 5' 3 (1.6 m)   Wt 111 lb 3.2 oz (50.4 kg)   SpO2 96%   BMI 19.70 kg/m    Review of Systems  Constitutional:  Positive for activity change, appetite change and fatigue. Negative for chills and unexpected weight change.  HENT:  Negative for congestion, postnasal drip, rhinorrhea, sneezing and sore throat.   Eyes:  Negative for redness.  Respiratory:  Negative for cough, chest tightness, shortness of breath and wheezing.   Cardiovascular:  Negative for chest pain and palpitations.  Gastrointestinal:  Negative for abdominal pain, constipation, diarrhea, nausea and vomiting.  Genitourinary:  Negative for dysuria and frequency.  Musculoskeletal:  Negative for arthralgias, back pain, joint swelling and neck pain.  Skin:  Negative for rash.  Neurological:  Positive for weakness. Negative for tremors and numbness.  Hematological:  Negative for adenopathy. Does not bruise/bleed easily.  Psychiatric/Behavioral:  Negative for behavioral problems (Depression), sleep disturbance and suicidal ideas. The patient is not nervous/anxious.     Physical Exam Vitals reviewed.  Constitutional:      General: She is not in acute distress.    Appearance: Normal appearance. She is normal weight. She is not ill-appearing.  HENT:     Head: Normocephalic and atraumatic.  Eyes:     Pupils: Pupils are equal, round, and reactive to light.  Cardiovascular:     Rate and Rhythm: Normal rate and regular rhythm.  Pulmonary:     Effort: Pulmonary effort is normal. No respiratory distress.  Skin:    General: Skin is warm and dry.  Neurological:     Mental Status: She is alert and oriented to person, place, and time. Mental status is at baseline.     Motor: Weakness present.  Psychiatric:        Mood and Affect: Mood normal.        Behavior: Behavior normal.       Assessment/Plan: 1. Chronic heart failure with preserved  ejection fraction (HFpEF) (HCC) (Primary) Discuss typical common symptoms she can experience and the need to rest   2. ESRD (end stage renal disease) on dialysis (HCC) Discuss nepro supplemental drinks with the dialysis nurse and her nephrologist.  - Nutritional Supplements (FEEDING SUPPLEMENT, NEPRO CARB STEADY,) LIQD; Take 237 mLs by mouth 2 (two) times daily with a meal.  Dispense: 14220 mL; Refill: 11  3. Type 2 diabetes mellitus with other diabetic kidney complication (HCC) Discuss nepro supplemental drinks with the dialysis nurse and her nephrologist.  - Nutritional Supplements (FEEDING SUPPLEMENT, NEPRO CARB STEADY,) LIQD; Take 237 mLs by mouth 2 (two) times daily with a meal.  Dispense: 14220 mL; Refill: 11  4. Orthostatic intolerance Noted, related to CHF and ESRD.   5. Severe protein-energy malnutrition Discuss nepro  supplemental drinks with the dialysis nurse and her nephrologist.  - Nutritional Supplements (FEEDING SUPPLEMENT, NEPRO CARB STEADY,) LIQD; Take 237 mLs by mouth 2 (two) times daily with a meal.  Dispense: 14220 mL; Refill: 11  6. Lack of appetite Discuss nepro supplemental drinks with the dialysis nurse and her nephrologist.  - Nutritional Supplements (FEEDING SUPPLEMENT, NEPRO CARB STEADY,) LIQD; Take 237 mLs by mouth 2 (two) times daily with a meal.  Dispense: 14220 mL; Refill: 11   General Counseling: Careen verbalizes understanding of the findings of todays visit and agrees with plan of treatment. I have discussed any further diagnostic evaluation that may be needed or ordered today. We also reviewed her medications today. she has been encouraged to call the office with any questions or concerns that should arise related to todays visit.    Counseling:    No orders of the defined types were placed in this encounter.   Meds ordered this encounter  Medications   Nutritional Supplements (FEEDING SUPPLEMENT, NEPRO CARB STEADY,) LIQD    Sig: Take 237 mLs by  mouth 2 (two) times daily with a meal.    Dispense:  14220 mL    Refill:  11    Fill new script today   cinacalcet  (SENSIPAR ) 60 MG tablet    Sig: Take 2 tablets (120 mg total) by mouth daily with supper.    Return if symptoms worsen or fail to improve, for keep next visit scheduled for february. .  [place] Controlled Substance Database was reviewed by me for overdose risk score (ORS)  Time spent:30 Minutes Time spent with patient included reviewing progress notes, labs, imaging studies, and discussing plan for follow up.   This patient was seen by Mardy Maxin, FNP-C in collaboration with Dr. Sigrid Bathe as a part of collaborative care agreement.  Ginnie Marich R. Maxin, MSN, FNP-C Internal Medicine

## 2024-11-10 NOTE — Telephone Encounter (Signed)
 Pt called that she is at dialysis and her BP 118/55 and she is having vertigo advised er to go to ED pt refused to go to ED made her appt at 11:20 today

## 2024-11-11 ENCOUNTER — Encounter: Payer: Self-pay | Admitting: Nurse Practitioner

## 2024-11-16 ENCOUNTER — Other Ambulatory Visit: Payer: Self-pay | Admitting: Nurse Practitioner

## 2025-01-03 ENCOUNTER — Other Ambulatory Visit: Payer: Self-pay

## 2025-01-03 ENCOUNTER — Encounter: Payer: Self-pay | Admitting: Hematology and Oncology

## 2025-01-03 ENCOUNTER — Emergency Department
Admission: EM | Admit: 2025-01-03 | Discharge: 2025-01-03 | Disposition: A | Discharge status: Home / Self Care | Payer: Medicare (Managed Care) | Attending: Emergency Medicine | Admitting: Emergency Medicine

## 2025-01-03 ENCOUNTER — Emergency Department: Payer: Medicare (Managed Care)

## 2025-01-03 DIAGNOSIS — R531 Weakness: Secondary | ICD-10-CM | POA: Insufficient documentation

## 2025-01-03 DIAGNOSIS — I120 Hypertensive chronic kidney disease with stage 5 chronic kidney disease or end stage renal disease: Secondary | ICD-10-CM | POA: Insufficient documentation

## 2025-01-03 DIAGNOSIS — Z992 Dependence on renal dialysis: Secondary | ICD-10-CM | POA: Diagnosis not present

## 2025-01-03 DIAGNOSIS — E1122 Type 2 diabetes mellitus with diabetic chronic kidney disease: Secondary | ICD-10-CM | POA: Diagnosis not present

## 2025-01-03 DIAGNOSIS — J101 Influenza due to other identified influenza virus with other respiratory manifestations: Secondary | ICD-10-CM | POA: Diagnosis not present

## 2025-01-03 DIAGNOSIS — N186 End stage renal disease: Secondary | ICD-10-CM | POA: Diagnosis not present

## 2025-01-03 DIAGNOSIS — R051 Acute cough: Secondary | ICD-10-CM

## 2025-01-03 DIAGNOSIS — R059 Cough, unspecified: Secondary | ICD-10-CM | POA: Diagnosis present

## 2025-01-03 HISTORY — DX: Heart failure, unspecified: I50.9

## 2025-01-03 HISTORY — DX: End stage renal disease: N18.6

## 2025-01-03 LAB — CBC
HCT: 35.9 % — ABNORMAL LOW (ref 36.0–46.0)
Hemoglobin: 11.3 g/dL — ABNORMAL LOW (ref 12.0–15.0)
MCH: 28 pg (ref 26.0–34.0)
MCHC: 31.5 g/dL (ref 30.0–36.0)
MCV: 88.9 fL (ref 80.0–100.0)
Platelets: 174 10*3/uL (ref 150–400)
RBC: 4.04 MIL/uL (ref 3.87–5.11)
RDW: 15.5 % (ref 11.5–15.5)
WBC: 3.2 10*3/uL — ABNORMAL LOW (ref 4.0–10.5)
nRBC: 0 % (ref 0.0–0.2)

## 2025-01-03 LAB — COMPREHENSIVE METABOLIC PANEL WITH GFR
ALT: <5 U/L (ref 0–44)
AST: 11 U/L — ABNORMAL LOW (ref 15–41)
Albumin: 3.9 g/dL (ref 3.5–5.0)
Alkaline Phosphatase: 49 U/L (ref 38–126)
Anion gap: 18 — ABNORMAL HIGH (ref 5–15)
BUN: 57 mg/dL — ABNORMAL HIGH (ref 8–23)
CO2: 29 mmol/L (ref 22–32)
Calcium: 9.7 mg/dL (ref 8.9–10.3)
Chloride: 97 mmol/L — ABNORMAL LOW (ref 98–111)
Creatinine, Ser: 8.8 mg/dL — ABNORMAL HIGH (ref 0.44–1.00)
GFR, Estimated: 4 mL/min — ABNORMAL LOW
Glucose, Bld: 91 mg/dL (ref 70–99)
Potassium: 3.9 mmol/L (ref 3.5–5.1)
Sodium: 144 mmol/L (ref 135–145)
Total Bilirubin: 0.2 mg/dL (ref 0.0–1.2)
Total Protein: 7 g/dL (ref 6.5–8.1)

## 2025-01-03 LAB — RESP PANEL BY RT-PCR (RSV, FLU A&B, COVID)  RVPGX2
Influenza A by PCR: POSITIVE — AB
Influenza B by PCR: NEGATIVE
Resp Syncytial Virus by PCR: NEGATIVE
SARS Coronavirus 2 by RT PCR: NEGATIVE

## 2025-01-03 NOTE — ED Provider Notes (Signed)
 "  Kristen Francis    Event Date/Time   First MD Initiated Contact with Patient 01/03/25 1708     (approximate)   History   Chief Complaint Weakness   HPI  Brian Kocourek is a 79 y.o. female with past medical history of hypertension, diabetes, ESRD on HD, stroke, and anemia who presents to the ED complaining of weakness.  Patient reports that she has been generally feeling unwell for the past 3 days with dry cough and generalized weakness.  She denies any difficulty breathing, fevers, or pain in her chest.  She also denies any nausea, vomiting, or diarrhea.  She is concerned that she may have an infection, but is not aware of any sick contacts.  She last received dialysis 2 days ago, had her schedule adjusted due to the snowstorm.     Physical Exam   Triage Vital Signs: ED Triage Vitals  Encounter Vitals Group     BP 01/03/25 1303 133/77     Girls Systolic BP Percentile --      Girls Diastolic BP Percentile --      Boys Systolic BP Percentile --      Boys Diastolic BP Percentile --      Pulse Rate 01/03/25 1303 78     Resp 01/03/25 1303 20     Temp 01/03/25 1303 97.9 F (36.6 C)     Temp Source 01/03/25 1303 Oral     SpO2 01/03/25 1303 98 %     Weight 01/03/25 1300 110 lb (49.9 kg)     Height 01/03/25 1300 5' 3 (1.6 m)     Head Circumference --      Peak Flow --      Pain Score 01/03/25 1258 0     Pain Loc --      Pain Education --      Exclude from Growth Chart --     Most recent vital signs: Vitals:   01/03/25 1749 01/03/25 2002  BP: (!) 155/77 (!) 151/79  Pulse: 81 82  Resp: 16 16  Temp: 98.6 F (37 C) 98.5 F (36.9 C)  SpO2: 100% 95%    Constitutional: Alert and oriented. Eyes: Conjunctivae are normal. Head: Atraumatic. Nose: No congestion/rhinnorhea. Mouth/Throat: Mucous membranes are moist.  Cardiovascular: Normal rate, regular rhythm. Grossly normal heart sounds.  2+ radial pulses bilaterally.  Left upper  extremity AV fistula with palpable thrill. Respiratory: Normal respiratory effort.  No retractions. Lungs CTAB. Gastrointestinal: Soft and nontender. No distention. Musculoskeletal: No lower extremity tenderness nor edema.  Neurologic:  Normal speech and language. No gross focal neurologic deficits are appreciated.    ED Results / Procedures / Treatments   Labs (all labs ordered are listed, but only abnormal results are displayed) Labs Reviewed  RESP PANEL BY RT-PCR (RSV, FLU A&B, COVID)  RVPGX2 - Abnormal; Notable for the following components:      Result Value   Influenza A by PCR POSITIVE (*)    All other components within normal limits  COMPREHENSIVE METABOLIC PANEL WITH GFR - Abnormal; Notable for the following components:   Chloride 97 (*)    BUN 57 (*)    Creatinine, Ser 8.80 (*)    AST 11 (*)    GFR, Estimated 4 (*)    Anion gap 18 (*)    All other components within normal limits  CBC - Abnormal; Notable for the following components:   WBC 3.2 (*)    Hemoglobin 11.3 (*)  HCT 35.9 (*)    All other components within normal limits  CBG MONITORING, ED     EKG  ED ECG REPORT I, Carlin Palin, the attending physician, personally viewed and interpreted this ECG.   Date: 01/03/2025  EKG Time: 12:59  Rate: 79  Rhythm: normal sinus rhythm, occasional PVC noted, unifocal  Axis: LAD  Intervals:none  ST&T Change: None  RADIOLOGY Chest x-ray reviewed and interpreted by me with no infiltrate, edema, or effusion.  PROCEDURES:  Critical Care performed: No  Procedures   MEDICATIONS ORDERED IN ED: Medications - No data to display   IMPRESSION / MDM / ASSESSMENT AND PLAN / ED COURSE  I reviewed the triage vital signs and the nursing notes.                              79 y.o. female with past medical history of hypertension, diabetes, stroke, anemia, and ESRD on HD who presents to the ED complaining of generalized weakness with dry cough and malaise for the  past 3 days.  Patient's presentation is most consistent with acute presentation with potential threat to life or bodily function.  Differential diagnosis includes, but is not limited to, pneumonia, COVID-19, influenza, anemia, electrolyte abnormality.  Patient nontoxic-appearing and in no acute distress, vital signs are unremarkable.  Labs consistent with known ESRD with no significant anemia, leukocytosis, or electrolyte abnormality.  LFTs are also unremarkable, chest x-ray negative for acute finding.  Patient did test positive for influenza, which seems to be the source of her symptoms.  Given she is tolerating oral intake without significant respiratory issues, she is appropriate for discharge home with outpatient follow-up.  She is outside the window for treatment with Tamiflu given symptoms for greater than 48 hours.  She was counseled to return to the ED for new or worsening symptoms, patient and spouse agree with plan.      FINAL CLINICAL IMPRESSION(S) / ED DIAGNOSES   Final diagnoses:  Influenza A  Generalized weakness  Acute cough     Rx / DC Orders   ED Discharge Orders     None        Francis:  This document was prepared using Dragon voice recognition software and may include unintentional dictation errors.   Palin Carlin, MD 01/03/25 2336  "

## 2025-01-03 NOTE — ED Triage Notes (Signed)
 First Nurse Note:  Pt via [facility] from home. Pt c/o flu like symptoms the past 3 days, also reports some weakness. Pt is A&OX4 and NAD.  EMS reports: 78 HR, 133/74 BP, 99.5 orally

## 2025-01-06 ENCOUNTER — Emergency Department: Payer: Medicare (Managed Care)

## 2025-01-06 ENCOUNTER — Ambulatory Visit: Payer: Medicare (Managed Care) | Admitting: Nurse Practitioner

## 2025-01-06 ENCOUNTER — Observation Stay
Admission: EM | Admit: 2025-01-06 | Discharge: 2025-01-07 | Disposition: A | Payer: Medicare (Managed Care) | Attending: Internal Medicine | Admitting: Internal Medicine

## 2025-01-06 ENCOUNTER — Other Ambulatory Visit: Payer: Self-pay

## 2025-01-06 ENCOUNTER — Telehealth: Payer: Self-pay | Admitting: Internal Medicine

## 2025-01-06 DIAGNOSIS — E1122 Type 2 diabetes mellitus with diabetic chronic kidney disease: Secondary | ICD-10-CM | POA: Insufficient documentation

## 2025-01-06 DIAGNOSIS — R55 Syncope and collapse: Principal | ICD-10-CM | POA: Diagnosis present

## 2025-01-06 DIAGNOSIS — I351 Nonrheumatic aortic (valve) insufficiency: Secondary | ICD-10-CM | POA: Insufficient documentation

## 2025-01-06 DIAGNOSIS — E785 Hyperlipidemia, unspecified: Secondary | ICD-10-CM | POA: Insufficient documentation

## 2025-01-06 DIAGNOSIS — Z79899 Other long term (current) drug therapy: Secondary | ICD-10-CM | POA: Insufficient documentation

## 2025-01-06 DIAGNOSIS — I340 Nonrheumatic mitral (valve) insufficiency: Secondary | ICD-10-CM | POA: Insufficient documentation

## 2025-01-06 DIAGNOSIS — N186 End stage renal disease: Secondary | ICD-10-CM | POA: Insufficient documentation

## 2025-01-06 DIAGNOSIS — D631 Anemia in chronic kidney disease: Secondary | ICD-10-CM | POA: Insufficient documentation

## 2025-01-06 DIAGNOSIS — Z992 Dependence on renal dialysis: Secondary | ICD-10-CM | POA: Insufficient documentation

## 2025-01-06 DIAGNOSIS — Z7982 Long term (current) use of aspirin: Secondary | ICD-10-CM | POA: Insufficient documentation

## 2025-01-06 DIAGNOSIS — R7989 Other specified abnormal findings of blood chemistry: Secondary | ICD-10-CM | POA: Insufficient documentation

## 2025-01-06 DIAGNOSIS — I132 Hypertensive heart and chronic kidney disease with heart failure and with stage 5 chronic kidney disease, or end stage renal disease: Secondary | ICD-10-CM | POA: Insufficient documentation

## 2025-01-06 DIAGNOSIS — I272 Pulmonary hypertension, unspecified: Secondary | ICD-10-CM | POA: Insufficient documentation

## 2025-01-06 DIAGNOSIS — Z87891 Personal history of nicotine dependence: Secondary | ICD-10-CM | POA: Insufficient documentation

## 2025-01-06 DIAGNOSIS — J101 Influenza due to other identified influenza virus with other respiratory manifestations: Secondary | ICD-10-CM | POA: Insufficient documentation

## 2025-01-06 DIAGNOSIS — I5022 Chronic systolic (congestive) heart failure: Secondary | ICD-10-CM | POA: Insufficient documentation

## 2025-01-06 DIAGNOSIS — Z8673 Personal history of transient ischemic attack (TIA), and cerebral infarction without residual deficits: Secondary | ICD-10-CM | POA: Insufficient documentation

## 2025-01-06 LAB — COMPREHENSIVE METABOLIC PANEL WITH GFR
ALT: <5 U/L (ref 0–44)
AST: 21 U/L (ref 15–41)
Albumin: 3.5 g/dL (ref 3.5–5.0)
Alkaline Phosphatase: 48 U/L (ref 38–126)
Anion gap: 20 — ABNORMAL HIGH (ref 5–15)
BUN: 72 mg/dL — ABNORMAL HIGH (ref 8–23)
CO2: 27 mmol/L (ref 22–32)
Calcium: 9.6 mg/dL (ref 8.9–10.3)
Chloride: 96 mmol/L — ABNORMAL LOW (ref 98–111)
Creatinine, Ser: 10 mg/dL — ABNORMAL HIGH (ref 0.44–1.00)
GFR, Estimated: 4 mL/min — ABNORMAL LOW
Glucose, Bld: 103 mg/dL — ABNORMAL HIGH (ref 70–99)
Potassium: 4 mmol/L (ref 3.5–5.1)
Sodium: 143 mmol/L (ref 135–145)
Total Bilirubin: 0.3 mg/dL (ref 0.0–1.2)
Total Protein: 6.6 g/dL (ref 6.5–8.1)

## 2025-01-06 LAB — TROPONIN T, HIGH SENSITIVITY
Troponin T High Sensitivity: 89 ng/L — ABNORMAL HIGH (ref 0–19)
Troponin T High Sensitivity: 92 ng/L — ABNORMAL HIGH (ref 0–19)

## 2025-01-06 LAB — BLOOD GAS, VENOUS
Acid-Base Excess: 6.3 mmol/L — ABNORMAL HIGH (ref 0.0–2.0)
Bicarbonate: 32.3 mmol/L — ABNORMAL HIGH (ref 20.0–28.0)
O2 Saturation: 53.7 %
Patient temperature: 37
pCO2, Ven: 51 mmHg (ref 44–60)
pH, Ven: 7.41 (ref 7.25–7.43)
pO2, Ven: 35 mmHg (ref 32–45)

## 2025-01-06 LAB — TSH: TSH: 1.31 u[IU]/mL (ref 0.350–4.500)

## 2025-01-06 LAB — CBC
HCT: 35.3 % — ABNORMAL LOW (ref 36.0–46.0)
Hemoglobin: 11.2 g/dL — ABNORMAL LOW (ref 12.0–15.0)
MCH: 27.9 pg (ref 26.0–34.0)
MCHC: 31.7 g/dL (ref 30.0–36.0)
MCV: 87.8 fL (ref 80.0–100.0)
Platelets: 159 10*3/uL (ref 150–400)
RBC: 4.02 MIL/uL (ref 3.87–5.11)
RDW: 15.7 % — ABNORMAL HIGH (ref 11.5–15.5)
WBC: 3.7 10*3/uL — ABNORMAL LOW (ref 4.0–10.5)
nRBC: 0 % (ref 0.0–0.2)

## 2025-01-06 LAB — LACTIC ACID, PLASMA: Lactic Acid, Venous: 0.8 mmol/L (ref 0.5–1.9)

## 2025-01-06 LAB — PROCALCITONIN: Procalcitonin: 1.34 ng/mL

## 2025-01-06 LAB — PRO BRAIN NATRIURETIC PEPTIDE: Pro Brain Natriuretic Peptide: 14371 pg/mL — ABNORMAL HIGH

## 2025-01-06 LAB — T4, FREE: Free T4: 0.87 ng/dL (ref 0.80–2.00)

## 2025-01-06 MED ORDER — AMLODIPINE BESYLATE 5 MG PO TABS
5.0000 mg | ORAL_TABLET | Freq: Every day | ORAL | Status: DC
  Administered 2025-01-07: 5 mg via ORAL
  Filled 2025-01-06: qty 1

## 2025-01-06 MED ORDER — SODIUM CHLORIDE 0.9% FLUSH
3.0000 mL | Freq: Two times a day (BID) | INTRAVENOUS | Status: DC
  Administered 2025-01-06 – 2025-01-07 (×2): 3 mL via INTRAVENOUS

## 2025-01-06 MED ORDER — SEVELAMER CARBONATE 800 MG PO TABS
800.0000 mg | ORAL_TABLET | Freq: Three times a day (TID) | ORAL | Status: DC
  Administered 2025-01-07 (×2): 800 mg via ORAL
  Filled 2025-01-06 (×3): qty 1

## 2025-01-06 MED ORDER — PANTOPRAZOLE SODIUM 40 MG PO TBEC
40.0000 mg | DELAYED_RELEASE_TABLET | Freq: Every day | ORAL | Status: DC
  Administered 2025-01-07: 40 mg via ORAL
  Filled 2025-01-06: qty 1

## 2025-01-06 MED ORDER — SODIUM CHLORIDE 0.9 % IV SOLN: 250.0000 mL | INTRAVENOUS | Status: DC | PRN

## 2025-01-06 MED ORDER — POLYETHYLENE GLYCOL 3350 17 G PO PACK: 17.0000 g | PACK | Freq: Every day | ORAL | Status: DC | PRN

## 2025-01-06 MED ORDER — HEPARIN SODIUM (PORCINE) 5000 UNIT/ML IJ SOLN
5000.0000 [IU] | Freq: Three times a day (TID) | INTRAMUSCULAR | Status: DC
  Administered 2025-01-06 – 2025-01-07 (×2): 5000 [IU] via SUBCUTANEOUS
  Filled 2025-01-06 (×2): qty 1

## 2025-01-06 MED ORDER — CINACALCET HCL 30 MG PO TABS
120.0000 mg | ORAL_TABLET | Freq: Every day | ORAL | Status: DC
  Administered 2025-01-06: 120 mg via ORAL
  Filled 2025-01-06 (×2): qty 4

## 2025-01-06 MED ORDER — HYDRALAZINE HCL 50 MG PO TABS: 50.0000 mg | ORAL_TABLET | Freq: Three times a day (TID) | ORAL | Status: DC | PRN

## 2025-01-06 MED ORDER — BISACODYL 5 MG PO TBEC: 10.0000 mg | DELAYED_RELEASE_TABLET | Freq: Every day | ORAL | Status: DC | PRN

## 2025-01-06 MED ORDER — ACETAMINOPHEN 325 MG PO TABS: 650.0000 mg | ORAL_TABLET | Freq: Four times a day (QID) | ORAL | Status: DC | PRN

## 2025-01-06 MED ORDER — ONDANSETRON HCL 4 MG PO TABS: 4.0000 mg | ORAL_TABLET | Freq: Four times a day (QID) | ORAL | Status: DC | PRN

## 2025-01-06 MED ORDER — ASPIRIN 81 MG PO TBEC
81.0000 mg | DELAYED_RELEASE_TABLET | Freq: Every day | ORAL | Status: DC
  Administered 2025-01-06 – 2025-01-07 (×2): 81 mg via ORAL
  Filled 2025-01-06 (×2): qty 1

## 2025-01-06 MED ORDER — ACETAMINOPHEN 325 MG RE SUPP: 650.0000 mg | Freq: Four times a day (QID) | RECTAL | Status: DC | PRN

## 2025-01-06 MED ORDER — AMLODIPINE BESYLATE 5 MG PO TABS: 10.0000 mg | ORAL_TABLET | Freq: Every day | ORAL | Status: DC

## 2025-01-06 MED ORDER — ONDANSETRON HCL 4 MG/2ML IJ SOLN: 4.0000 mg | Freq: Four times a day (QID) | INTRAMUSCULAR | Status: DC | PRN

## 2025-01-06 MED ORDER — SODIUM CHLORIDE 0.9% FLUSH
3.0000 mL | Freq: Two times a day (BID) | INTRAVENOUS | Status: DC
  Administered 2025-01-07: 3 mL via INTRAVENOUS

## 2025-01-06 MED ORDER — GUAIFENESIN-DM 100-10 MG/5ML PO SYRP: 10.0000 mL | ORAL_SOLUTION | ORAL | Status: DC | PRN

## 2025-01-06 MED ORDER — ROSUVASTATIN CALCIUM 10 MG PO TABS
10.0000 mg | ORAL_TABLET | Freq: Every day | ORAL | Status: DC
  Administered 2025-01-06: 10 mg via ORAL
  Filled 2025-01-06 (×2): qty 1

## 2025-01-06 MED ORDER — SODIUM CHLORIDE 0.9% FLUSH: 3.0000 mL | INTRAVENOUS | Status: DC | PRN

## 2025-01-06 MED ORDER — ALBUTEROL SULFATE (2.5 MG/3ML) 0.083% IN NEBU: 2.5000 mg | INHALATION_SOLUTION | RESPIRATORY_TRACT | Status: DC | PRN

## 2025-01-06 NOTE — ED Provider Notes (Signed)
 "  [facility] Provider Note    Event Date/Time   First MD Initiated Contact with Patient 01/06/25 1525     (approximate)   History   Loss of Consciousness   HPI  Kristen Francis is a 79 y.o. female ESRD on hemodialysis (M/W/F), hypertension, hyperlipidemia, CVA, anemia, CHF (EF 35-40%), severe pulmonary hypertension with RVSP, moderate MR/AR, prior pericardial effusion followed by [facility] cardiology (Dr. Wilburn) recently diagnosed with influenza on 01/03/2025 who presents with non-exertional syncope during dialysis.  Patient was diagnosed with the flu after 3 days of cough and generalized weakness on 1/31.  She felt like she was recovering.  She attended dialysis today and was hooked up to the machine.  She received about half of her dialysis and states that the next thing she remembers is waking up.  She denies any preceding lightheadedness chest pain shortness of breath.  She denies any current symptoms now except for feeling generally weak.  No headache hearing or vision changes.  She reports compliance with all of her medications.  She does make some urine at baseline.  Per EMS, she was mildly confused upon their arrival but no seizure-like activity was actually witnessed       Physical Exam   Triage Vital Signs: ED Triage Vitals  Encounter Vitals Group     BP 01/06/25 1113 114/66     Girls Systolic BP Percentile --      Girls Diastolic BP Percentile --      Boys Systolic BP Percentile --      Boys Diastolic BP Percentile --      Pulse Rate 01/06/25 1113 64     Resp 01/06/25 1113 16     Temp 01/06/25 1113 98.2 F (36.8 C)     Temp Source 01/06/25 1113 Oral     SpO2 01/06/25 1113 100 %     Weight --      Height --      Head Circumference --      Peak Flow --      Pain Score 01/06/25 1130 0     Pain Loc --      Pain Education --      Exclude from Growth Chart --     Most recent vital signs: Vitals:   01/06/25 1550 01/06/25 1603  BP:     Pulse:    Resp: 16   Temp:  98.4 F (36.9 C)  SpO2:      Nursing Triage Note reviewed. Vital signs reviewed and patients oxygen saturation is normoxic  General: Patient is well nourished, well developed, awake and alert, appears chronically ill Head: Normocephalic and atraumatic Eyes: Normal inspection, extraocular muscles intact, no conjunctival pallor Ear, nose, throat: Normal external exam Neck: Normal range of motion Respiratory: Patient is in no respiratory distress, lungs mild rales Cardiovascular: Patient is not tachycardic, RR Fistula accessed in left upper extremmity, palpable thrill GI: Abd SNT with no guarding or rebound  Back: Normal inspection of the back with good strength and range of motion throughout all ext Extremities: pulses intact with good cap refills, no LE pitting edema or calf tenderness Neuro: The patient is alert and oriented to person, place, and time, appropriately conversive, with 5/5 bilat UE/LE strength, no gross motor or sensory defects noted. Coordination appears to be adequate. Skin: Warm, dry, and intact Psych: normal mood and affect, no SI or HI  ED Results / Procedures / Treatments   Labs (all labs ordered are  listed, but only abnormal results are displayed) Labs Reviewed  COMPREHENSIVE METABOLIC PANEL WITH GFR - Abnormal; Notable for the following components:      Result Value   Chloride 96 (*)    Glucose, Bld 103 (*)    BUN 72 (*)    Creatinine, Ser 10.00 (*)    GFR, Estimated 4 (*)    Anion gap 20 (*)    All other components within normal limits  CBC - Abnormal; Notable for the following components:   WBC 3.7 (*)    Hemoglobin 11.2 (*)    HCT 35.3 (*)    RDW 15.7 (*)    All other components within normal limits  BLOOD GAS, VENOUS - Abnormal; Notable for the following components:   Bicarbonate 32.3 (*)    Acid-Base Excess 6.3 (*)    All other components within normal limits  PRO BRAIN NATRIURETIC PEPTIDE - Abnormal; Notable for  the following components:   Pro Brain Natriuretic Peptide 14,371.0 (*)    All other components within normal limits  TROPONIN T, HIGH SENSITIVITY - Abnormal; Notable for the following components:   Troponin T High Sensitivity 89 (*)    All other components within normal limits  TROPONIN T, HIGH SENSITIVITY - Abnormal; Notable for the following components:   Troponin T High Sensitivity 92 (*)    All other components within normal limits  CULTURE, BLOOD (ROUTINE X 2)  CULTURE, BLOOD (ROUTINE X 2)  URINE CULTURE  TSH  T4, FREE  PROCALCITONIN  URINALYSIS, ROUTINE W REFLEX MICROSCOPIC  COMPREHENSIVE METABOLIC PANEL WITH GFR  CBC  LACTIC ACID, PLASMA  CBG MONITORING, ED     EKG EKG and rhythm strip are interpreted by myself:   ZXH:amjibrjmipr sinus rhythm] at heart rate of 64, normal QRS duration, QTc 449, nonspecific ST segments and T waves, PVCs EKG not consistent with Acute STEMI Rhythm strip: bradycardic sinus rhythm with PVCs in lead II   RADIOLOGY CT head: No intracranial hemorrhage on my independent review interpretation radiologist reads this as possible Age-indeterminate small right cerebellar infarction.  CXR: No acute abnormality on my independent review interpretation radiologist reads this as atelectasis ECHO: Pending    PROCEDURES:  Critical Care performed: No  Procedures   MEDICATIONS ORDERED IN ED: Medications  heparin  injection 5,000 Units (has no administration in time range)  sodium chloride  flush (NS) 0.9 % injection 3 mL (has no administration in time range)  sodium chloride  flush (NS) 0.9 % injection 3 mL (has no administration in time range)  sodium chloride  flush (NS) 0.9 % injection 3 mL (has no administration in time range)  0.9 %  sodium chloride  infusion (has no administration in time range)  acetaminophen  (TYLENOL ) tablet 650 mg (has no administration in time range)    Or  acetaminophen  (TYLENOL ) suppository 650 mg (has no administration in  time range)  polyethylene glycol (MIRALAX  / GLYCOLAX ) packet 17 g (has no administration in time range)  bisacodyl  (DULCOLAX) EC tablet 10 mg (has no administration in time range)  ondansetron  (ZOFRAN ) tablet 4 mg (has no administration in time range)    Or  ondansetron  (ZOFRAN ) injection 4 mg (has no administration in time range)  albuterol  (PROVENTIL ) (2.5 MG/3ML) 0.083% nebulizer solution 2.5 mg (has no administration in time range)  amLODipine  (NORVASC ) tablet 10 mg (has no administration in time range)  aspirin  EC tablet 81 mg (has no administration in time range)  cinacalcet  (SENSIPAR ) tablet 120 mg (has no administration in time range)  hydrALAZINE  (APRESOLINE ) tablet 50 mg (has no administration in time range)  pantoprazole  (PROTONIX ) EC tablet 40 mg (has no administration in time range)  rosuvastatin  (CRESTOR ) tablet 10 mg (has no administration in time range)  sevelamer  carbonate (RENVELA ) tablet 800 mg (has no administration in time range)     IMPRESSION / MDM / ASSESSMENT AND PLAN / ED COURSE                                Differential diagnosis includes, but is not limited to, pulmonary hypertension, arrhythmia, worsening regurgitation, electrolyte derangement, anemia, CHF, end-stage renal disease on dialysis, syncope seizure  ED course: Patient presents and has no focal neurological deficits.  Although patient reportedly did have confusion, no seizure-like activity was witnessed I think given her history of not having previously having any seizures, I suspect cardiogenic syncope versus orthostasis syncope more likely especially in the setting of recent flu diagnosis.  Even sterile will obtain a CT head to ensure no intracranial hemorrhage.  EKG demonstrated sinus bradycardia the patient is not hypotensive.  Creatinine is 10.0 which is consistent with dialysis but potassium is 4.0.  She has no profound leukocytosis.  Blood gas demonstrated no acidosis.  Troponin is elevated but  she denies any chest pain and EKG demonstrates no acute ischemia.  I have ordered an echo.  Will plan to bring the patient into the hospitalist today with cardiology consulting   Clinical Course as of 01/06/25 1810  Tue Jan 06, 2025  1623 Case forwarded to oncall cardiologist via epic message to non-emergently consult  [HD]  1642 Pro Brain Natriuretic Peptide(!): 14,371.0 [HD]  1642 Troponin T High Sensitivity(!): 89 [HD]  1642 DG Chest 2 View Atelectasis [HD]  1756 Case discussed with hospitalist for admission [HD]  1806 Blood gas, venous(!) Acidosis [HD]  1806 Procalcitonin: 1.34 [HD]  1806 Procalcitonin: 1.34 Procalcitonin is elevated.  Given this elevation we will obtain blood cultures and a lactate, will defer abx to hospitalist. Hospitalist updated via msg  [HD]    Clinical Course User Index [HD] Nicholaus Rolland BRAVO, MD   -- Risk: 5 This patient has a high risk of morbidity due to further diagnostic testing or treatment. Rationale: This patients evaluation and management involve a high risk of morbidity due to the potential severity of presenting symptoms, need for diagnostic testing, and/or initiation of treatment that may require close monitoring. The differential includes conditions with potential for significant deterioration or requiring escalation of care. Treatment decisions in the ED, including medication administration, procedural interventions, or disposition planning, reflect this level of risk. COPA: 5 The patient has the following acute or chronic illness/injury that poses a possible threat to life or bodily function: [X] : The patient has a potentially serious acute condition or an acute exacerbation of a chronic illness requiring urgent evaluation and management in the Emergency Department. The clinical presentation necessitates immediate consideration of life-threatening or function-threatening diagnoses, even if they are ultimately ruled out.   FINAL CLINICAL  IMPRESSION(S) / ED DIAGNOSES   Final diagnoses:  Syncope and collapse  ESRD (end stage renal disease) on dialysis (HCC)  Elevated procalcitonin     Rx / DC Orders   ED Discharge Orders     None        Note:  This document was prepared using Dragon voice recognition software and may include unintentional dictation errors.   Nicholaus Rolland BRAVO, MD 01/06/25 1810  "

## 2025-01-06 NOTE — Telephone Encounter (Signed)
 Lvm to reschedule 01/06/2025 appointment -Andree

## 2025-01-06 NOTE — H&P (Signed)
 [facility] History and Physical   Patient: Kristen Francis FMW:[id number]   PCP: Fernand Sigrid HERO, MD DOB: Aug 20, 1946   DOA: 01/06/2025   DOS: 01/06/2025   DOS: the patient was seen and examined on 01/06/2025  Patient coming from: The patient is coming from Home, HD center  Chief Complaint: Syncope  HPI: Kristen Francis is a 79 y.o. female with PMH of ESRD on hemodialysis (M/W/F), hypertension, hyperlipidemia, CVA, anemia, CHF (EF 35-40%), severe pulmonary hypertension with RVSP, moderate MR/AR, prior pericardial effusion followed by [facility] cardiology (Dr. Wilburn) recently diagnosed with influenza on 01/03/2025 who presents with non-exertional syncope during dialysis.  Patient was diagnosed with the flu after 3 days of cough and generalized weakness on 1/31.  She felt like she was recovering.  She attended dialysis today and was hooked up to the machine.  She received about half of her dialysis and states that the next thing she remembers is waking up.  She denies any preceding lightheadedness chest pain shortness of breath.  She denies any current symptoms now except for feeling generally weak.  No headache hearing or vision changes.  She reports compliance with all of her medications.  She does make some urine at baseline.  Per EMS, she was mildly confused upon their arrival but no seizure-like activity was actually witnessed     ED Course: VS afebrile, pulse 64-68, RR 16-23, BP 114/66, 100% on room air CMP: Creatinine 10, BUN 72, chloride 96, glucose 103, anion gap 20, rest within normal range BNP 14,371 elevated most likely due to ESRD Troponin 89 >>92 Procalcitonin 1.34 CBC WBC 3.7 CXR: Mild bibasilar atelectasis.  CT head: 1. No acute intracranial hemorrhage or calvarial fracture. 2. Age-indeterminate small right cerebellar infarction.   Review of Systems: as mentioned in the history of present illness.  All other systems reviewed and are negative.  Past Medical History:   Diagnosis Date   Anemia    Arthritis    CHF (congestive heart failure) (HCC)    Chronic kidney disease    ESRD   Complication of anesthesia    had procedure and felt incision early 80s   End stage renal disease on dialysis (HCC)    Gout    Hyperlipemia    Hypertension    Macular degeneration, right eye    Stroke [facility])    Past Surgical History:  Procedure Laterality Date   A/V FISTULAGRAM Left 12/25/2018   Procedure: A/V FISTULAGRAM;  Surgeon: Jama Cordella MATSU, MD;  Location: [facility] INVASIVE CV LAB;  Service: Cardiovascular;  Laterality: Left;   A/V FISTULAGRAM Left 10/09/2023   Procedure: A/V Fistulagram;  Surgeon: Jama Cordella MATSU, MD;  Location: [facility] INVASIVE CV LAB;  Service: Cardiovascular;  Laterality: Left;   ABDOMINAL HYSTERECTOMY     APPENDECTOMY     AV FISTULA PLACEMENT Left 07/12/2018   Procedure: INSERTION OF ARTERIOVENOUS (AV) GORE-TEX GRAFT ARM ( BRACHIAL AXILLARY );  Surgeon: Jama Cordella MATSU, MD;  Location: [facility] ORS;  Service: Vascular;  Laterality: Left;   COLONOSCOPY WITH PROPOFOL  N/A 01/14/2016   Procedure: COLONOSCOPY WITH PROPOFOL ;  Surgeon: Lamar ONEIDA Holmes, MD;  Location: [facility] ENDOSCOPY;  Service: Endoscopy;  Laterality: N/A;   COLONOSCOPY WITH PROPOFOL  N/A 02/14/2022   Procedure: COLONOSCOPY WITH PROPOFOL ;  Surgeon: Maryruth Ole ONEIDA, MD;  Location: [facility] ENDOSCOPY;  Service: Endoscopy;  Laterality: N/A;   RIGHT/LEFT HEART CATH AND CORONARY ANGIOGRAPHY N/A 10/25/2023   Procedure: RIGHT/LEFT HEART CATH AND CORONARY ANGIOGRAPHY;  Surgeon: Ammon Blunt, MD;  Location: [facility] INVASIVE CV LAB;  Service: Cardiovascular;  Laterality: N/A;   TONSILLECTOMY     Social History:  reports that she has quit smoking. She has never used smokeless tobacco. She reports that she does not drink alcohol and does not use drugs.  Allergies[1]   Family history reviewed and not pertinent Family History  Problem Relation Age of Onset   Kidney disease Mother    Breast  cancer Neg Hx      Prior to Admission medications  Medication Sig Start Date End Date Taking? Authorizing Provider  amLODipine  (NORVASC ) 10 MG tablet TAKE 1 TABLET(10 MG) BY MOUTH DAILY 11/12/24   Liana Fish, NP  aspirin  EC 81 MG EC tablet Take 1 tablet (81 mg total) by mouth daily. Swallow whole. 06/05/21   Patel, Sona, MD  B Complex-C-Folic Acid  (RENAL-VITE) 0.8 MG TABS Take 0.8 mg by mouth every evening. On dialysis days    [provider]  carvedilol  (COREG ) 25 MG tablet TAKE 1 TABLET(25 MG) BY MOUTH TWICE DAILY WITH A MEAL 07/21/24   Abernathy, Fish, NP  cholecalciferol  (VITAMIN D ) 1000 units tablet Take 1,000 Units by mouth daily.     [provider]  cinacalcet  (SENSIPAR ) 60 MG tablet Take 2 tablets (120 mg total) by mouth daily with supper. 11/10/24   Liana Fish, NP  FEROSUL 325 (65 Fe) MG tablet TAKE 1 TABLET BY MOUTH TWICE DAILY WITH A MEAL 11/17/24   Abernathy, Alyssa, NP  hydrALAZINE  (APRESOLINE ) 50 MG tablet TAKE 1 TABLET(50 MG) BY MOUTH THREE TIMES DAILY 07/02/24   Abernathy, Alyssa, NP  losartan  (COZAAR ) 50 MG tablet TAKE 1 TABLET(50 MG) BY MOUTH DAILY 11/12/24   Liana Fish, NP  Nutritional Supplements (FEEDING SUPPLEMENT, NEPRO CARB STEADY,) LIQD Take 237 mLs by mouth 2 (two) times daily with a meal. 11/10/24   Abernathy, Fish, NP  omeprazole  (PRILOSEC) 40 MG capsule TAKE ONE CAPSULE BY MOUTH DAILY 08/05/24   Liana Fish, NP  rosuvastatin  (CRESTOR ) 10 MG tablet TAKE 1 TABLET BY MOUTH ON NON DIALYSIS DAYS 11/12/24   Abernathy, Alyssa, NP  sacubitril -valsartan  (ENTRESTO ) 49-51 MG Take 1 tablet by mouth every 12 (twelve) hours. 10/29/23   [provider]  sennosides-docusate sodium  (SENOKOT-S) 8.6-50 MG tablet Take 2 tablets by mouth daily.    [provider]  sevelamer  carbonate (RENVELA ) 800 MG tablet Take 1,600 mg by mouth 3 (three) times daily with meals.    [provider]    Physical Exam: Vitals:    01/06/25 1113 01/06/25 1545 01/06/25 1550 01/06/25 1603  BP: 114/66 (!) 145/71    Pulse: 64 68    Resp: 16 (!) 23 16   Temp: 98.2 F (36.8 C)   98.4 F (36.9 C)  TempSrc: Oral   Oral  SpO2: 100% 92%      General: NAD. Appear in no distress, affect appropriate Eyes: PERRLA, Conjunctiva normal ENT: Oral Mucosa Clear, moist  Neck: No JVD, no Abnormal Mass Or lumps Cardiovascular: S1 and S2 Present, no Murmur, peripheral pulses symmetrical Respiratory: good respiratory effort, Bilateral Air entry equal and Decreased, no signs of accessory muscle use, Clear to Auscultation, no Crackles, no wheezes Abdomen: Bowel Sound present, Soft and no tenderness,  Extremities: no Pedal edema,  Neurologic: without any new focal findings Gait not checked due to patient safety concerns  Data Reviewed: I have personally reviewed and interpreted labs, imaging as discussed below.  CBC: Recent Labs  Lab 01/03/25 1349 01/06/25 1429  WBC 3.2* 3.7*  HGB 11.3* 11.2*  HCT 35.9* 35.3*  MCV 88.9 87.8  PLT 174 159   Basic Metabolic Panel: Recent Labs  Lab 01/03/25 1349 01/06/25 1429  NA 144 143  K 3.9 4.0  CL 97* 96*  CO2 29 27  GLUCOSE 91 103*  BUN 57* 72*  CREATININE 8.80* 10.00*  CALCIUM  9.7 9.6   GFR: Estimated Creatinine Clearance: 3.7 mL/min (A) (by C-G formula based on SCr of 10 mg/dL (H)). Liver Function Tests: Recent Labs  Lab 01/03/25 1349 01/06/25 1429  AST 11* 21  ALT <5 <5  ALKPHOS 49 48  BILITOT 0.2 0.3  PROT 7.0 6.6  ALBUMIN 3.9 3.5   No results for input(s): LIPASE, AMYLASE in the last 168 hours. No results for input(s): AMMONIA in the last 168 hours. Coagulation Profile: No results for input(s): INR, PROTIME in the last 168 hours. Cardiac Enzymes: No results for input(s): CKTOTAL, CKMB, CKMBINDEX, TROPONINI in the last 168 hours. BNP (last 3 results) Recent Labs    01/06/25 1545  PROBNP 14,371.0*   HbA1C: No results for input(s): HGBA1C  in the last 72 hours. CBG: No results for input(s): GLUCAP in the last 168 hours. Lipid Profile: No results for input(s): CHOL, HDL, LDLCALC, TRIG, CHOLHDL, LDLDIRECT in the last 72 hours. Thyroid  Function Tests: Recent Labs    01/06/25 1545  TSH 1.310  FREET4 0.87   Anemia Panel: No results for input(s): VITAMINB12, FOLATE, FERRITIN, TIBC, IRON , RETICCTPCT in the last 72 hours. Urine analysis:    Component Value Date/Time   COLORURINE YELLOW (A) 03/03/2023 1420   APPEARANCEUR Clear 05/24/2023 1542   LABSPEC 1.009 03/03/2023 1420   PHURINE 8.0 03/03/2023 1420   GLUCOSEU Negative 05/24/2023 1542   HGBUR NEGATIVE 03/03/2023 1420   BILIRUBINUR Negative 05/24/2023 1542   KETONESUR NEGATIVE 03/03/2023 1420   PROTEINUR 3+ (A) 05/24/2023 1542   PROTEINUR >=300 (A) 03/03/2023 1420   NITRITE Negative 05/24/2023 1542   NITRITE NEGATIVE 03/03/2023 1420   LEUKOCYTESUR Negative 05/24/2023 1542   LEUKOCYTESUR NEGATIVE 03/03/2023 1420    Radiological Exams on Admission: CT Head Wo Contrast Result Date: 01/06/2025 EXAM: CT HEAD WITHOUT CONTRAST 01/06/2025 04:40:10 PM TECHNIQUE: CT of the head was performed without the administration of intravenous contrast. Automated exposure control, iterative reconstruction, and/or weight based adjustment of the mA/kV was utilized to reduce the radiation dose to as low as reasonably achievable. COMPARISON: CT head 03/04/2023 and MRI brain 03/04/2023. CLINICAL HISTORY: Syncope. FINDINGS: BRAIN AND VENTRICLES: No acute hemorrhage. No evidence of acute territorial infarct. Age-indeterminate small right cerebellar infarction. Chronic bilateral gangliocapsular infarctions. There is overall similar moderate scattered white matter hypodensities which are nonspecific but most commonly represent chronic microvascular ischemic changes. No hydrocephalus. No extra-axial collection. No mass effect or midline shift. ORBITS: No acute abnormality.  SINUSES: Mild right mastoid effusion. SOFT TISSUES AND SKULL: No acute soft Francis abnormality. No skull fracture. Bilateral carotid and vertebral artery calcifications. IMPRESSION: 1. No acute intracranial hemorrhage or calvarial fracture. 2. Age-indeterminate small right cerebellar infarction. Electronically signed by: Prentice Spade MD 01/06/2025 04:56 PM EST RP Workstation: [id number]   DG Chest 2 View Result Date: 01/06/2025 CLINICAL DATA:  Shortness of breath and syncope. EXAM: CHEST - 2 VIEW COMPARISON:  January 03, 2025 FINDINGS: The cardiac silhouette is mildly enlarged and unchanged in size. Mild atelectatic changes are seen within the bilateral lung bases. No pleural effusion or pneumothorax is identified. A radiopaque vascular stent is seen within the proximal left upper extremity. Abdominal pain  is seen involving both shoulders and throughout the thoracic spine. IMPRESSION: Mild bibasilar atelectasis. Electronically Signed   By: Suzen Dials M.D.   On: 01/06/2025 16:36   EKG: Independently reviewed. SR, occasional PACs, LAD, nonspecific ST and T wave changes  Echocardiogram: Order placed  I reviewed all nursing notes, pharmacy notes, vitals, pertinent old records.  Assessment/Plan Principal Problem:   Syncope  # Syncope Unknown cause could be cardiogenic Continue to monitor on telemetry Cardiology was notified by ED physician Follow-up cardio consult Follow-up 2D echocardiac Check orthostatic  # Chronic systolic CHF LVEF 35 to 40%, HTN, HLD severe PAH, moderate MR/AR, prior pericardial effusion Resumed amlodipine  low-dose 5 mg p.o. daily with holding parameters and hydralazine  switched to as needed Held Coreg , Entresto , losartan  due to low blood pressure and bradycardia. Gradually resume home medications as BP and HR allows Monitor BP and titrate medications according Follow cardiology for further recommendation Follow-up 2D echocardiogram to check pericardial  effusion  Influenza A+, tested on 1/31 Patient was seen in the ED on 1/31 due to symptoms for past 3 days so Tamiflu was not started due to symptoms more than 48 hours Patient has mild cough and complaining of chest wall pain due to coughing Continue Robitussin DM as needed   ESRD on HD Follow nephrology for HD Continue Sensipar  and Renvela    H/o CVA, continued aspirin  and statin No active issues   Anemia due to ESRD,  Hb 11.2 stable,    Nutrition: Renal diet DVT Prophylaxis: Subcutaneous Heparin    Advance goals of care discussion: DNR   Consults: [facility]iology, secure chat was sent by ED physician  Family Communication: family was not present at bedside, at the time of interview.  Opportunity was given to ask question and all questions were answered satisfactorily.  Disposition: Admitted as observation, telemetry unit. Likely to be discharged Home, in 1-2 days when cleared by cardiology.  I have discussed plan of care as described above with RN and patient/family.  Severity of Illness: The appropriate patient status for this patient is OBSERVATION. Observation status is judged to be reasonable and necessary in order to provide the required intensity of service to ensure the patient's safety. The patient's presenting symptoms, physical exam findings, and initial radiographic and laboratory data in the context of their medical condition is felt to place them at decreased risk for further clinical deterioration. Furthermore, it is anticipated that the patient will be medically stable for discharge from the hospital within 2 midnights of admission.    Author: ELVAN SOR, MD [facility] 01/06/2025 6:10 PM   To reach On-call, see care teams to locate the attending and reach out to them via [email or web address]. If 7PM-7AM, please contact night-coverage If you still have difficulty reaching the attending provider, please page the [facility] (Director on Call) for [facility] on  amion for assistance.     [1]  Allergies Allergen Reactions   Other Other (See Comments)    Pt does not receive Blood    Pneumococcal 20-Val Conj Vacc Other (See Comments)   Cefepime  Other (See Comments)   Penicillins Hives    Has patient had a PCN reaction causing immediate rash, facial/tongue/throat swelling, SOB or lightheadedness with hypotension: YES Has patient had a PCN reaction causing severe rash involving mucus membranes or skin necrosis: no Has patient had a PCN reaction that required hospitalization: no Has patient had a PCN reaction occurring within the last 10 years: no If all of the above answers are NO,  then may proceed with Cephalosporin use.    Sodium Bicarbonate Itching

## 2025-01-07 ENCOUNTER — Observation Stay: Admit: 2025-01-07 | Discharge: 2025-01-07 | Disposition: A | Payer: Medicare (Managed Care)

## 2025-01-07 ENCOUNTER — Observation Stay: Admit: 2025-01-07 | Payer: Medicare (Managed Care)

## 2025-01-07 LAB — ECHOCARDIOGRAM COMPLETE
AR max vel: 2.31 cm2
AV Area VTI: 2.44 cm2
AV Area mean vel: 2.37 cm2
AV Mean grad: 4 mmHg
AV Peak grad: 7.6 mmHg
Ao pk vel: 1.38 m/s
Area-P 1/2: 5.62 cm2
MV VTI: 2.8 cm2
P 1/2 time: 1020 ms
S' Lateral: 3.1 cm

## 2025-01-07 LAB — COMPREHENSIVE METABOLIC PANEL WITH GFR
ALT: <5 U/L (ref 0–44)
AST: 19 U/L (ref 15–41)
Albumin: 3.1 g/dL — ABNORMAL LOW (ref 3.5–5.0)
Alkaline Phosphatase: 41 U/L (ref 38–126)
Anion gap: 18 — ABNORMAL HIGH (ref 5–15)
BUN: 75 mg/dL — ABNORMAL HIGH (ref 8–23)
CO2: 25 mmol/L (ref 22–32)
Calcium: 8.7 mg/dL — ABNORMAL LOW (ref 8.9–10.3)
Chloride: 99 mmol/L (ref 98–111)
Creatinine, Ser: 10.1 mg/dL — ABNORMAL HIGH (ref 0.44–1.00)
GFR, Estimated: 4 mL/min — ABNORMAL LOW
Glucose, Bld: 77 mg/dL (ref 70–99)
Potassium: 4.1 mmol/L (ref 3.5–5.1)
Sodium: 142 mmol/L (ref 135–145)
Total Bilirubin: 0.2 mg/dL (ref 0.0–1.2)
Total Protein: 5.8 g/dL — ABNORMAL LOW (ref 6.5–8.1)

## 2025-01-07 LAB — CBC
HCT: 32 % — ABNORMAL LOW (ref 36.0–46.0)
Hemoglobin: 9.8 g/dL — ABNORMAL LOW (ref 12.0–15.0)
MCH: 27.3 pg (ref 26.0–34.0)
MCHC: 30.6 g/dL (ref 30.0–36.0)
MCV: 89.1 fL (ref 80.0–100.0)
Platelets: 155 10*3/uL (ref 150–400)
RBC: 3.59 MIL/uL — ABNORMAL LOW (ref 3.87–5.11)
RDW: 15.6 % — ABNORMAL HIGH (ref 11.5–15.5)
WBC: 3.5 10*3/uL — ABNORMAL LOW (ref 4.0–10.5)
nRBC: 0 % (ref 0.0–0.2)

## 2025-01-07 LAB — HEPATITIS B SURFACE ANTIGEN: Hepatitis B Surface Ag: NONREACTIVE

## 2025-01-07 MED ORDER — FERROUS SULFATE 325 (65 FE) MG PO TABS: 325.0000 mg | ORAL_TABLET | Freq: Two times a day (BID) | ORAL | 3 refills | Status: AC

## 2025-01-07 NOTE — TOC Initial Note (Signed)
 Transition of Care [facility]) - Initial/Assessment Note    Patient Details  Name: Kristen Francis MRN: [id number] Date of Birth: 1946/11/06  [facility] CM/SW Contact:    Kapri Nero L Desirre Eickhoff, LCSW Phone Number: 01/07/2025, 1:12 PM  Clinical Narrative:                  CSW met with patient. Spouse at bedside. Recommendations for Home Health discussed. Recommendation declined. DME discussed. Patient advised of no need.   Spouse advised they are ready to be discharged. He advised that he has a car and will provide transportation.        Patient Goals and CMS Choice            Expected Discharge Plan and Services         Expected Discharge Date: 01/07/25                                    Prior Living Arrangements/Services                       Activities of Daily Living      Permission Sought/Granted                  Emotional Assessment              Admission diagnosis:  Syncope [R55] Patient Active Problem List   Diagnosis Date Noted   Syncope 01/06/2025   Age-related osteoporosis without current pathological fracture 09/02/2024   Elevated troponin 11/12/2023   Volume overload 10/22/2023   SOB (shortness of breath) 03/02/2023   Sepsis (HCC) 03/02/2023   TIA (transient ischemic attack) 06/03/2021   Gastroesophageal reflux disease without esophagitis 12/16/2020   Nausea 12/16/2020   Left foot pain 10/16/2020   Left carotid stenosis 09/06/2020   Multinodular goiter 09/06/2020   Atherosclerosis of aorta 08/18/2020   Other fatigue 07/25/2020   Left carotid bruit 07/25/2020   Decreased appetite 07/25/2020   Elevated ferritin 07/20/2020   Encounter for general adult medical examination with abnormal findings 12/13/2019   Encounter for screening mammogram for malignant neoplasm of breast 12/13/2019   Dysuria 12/13/2019   Complication from renal dialysis device 12/02/2018   Allergic contact dermatitis, unspecified cause  09/21/2018   Coagulation defect, unspecified 09/21/2018   Diarrhea, unspecified 09/21/2018   Fever, unspecified 09/21/2018   Headache, unspecified 09/21/2018   Hypertensive chronic kidney disease with stage 1 through stage 4 chronic kidney disease, or unspecified chronic kidney disease 09/21/2018   Iron  deficiency anemia, unspecified 09/21/2018   Moderate protein-calorie malnutrition 09/21/2018   Other fluid overload 09/21/2018   Pain, unspecified 09/21/2018   Secondary hyperparathyroidism of renal origin 09/21/2018   Type 2 diabetes mellitus with other diabetic kidney complication (HCC) 09/21/2018   Anemia in chronic kidney disease 09/21/2018   Encounter for immunization 09/21/2018   Peripheral vascular disease 09/21/2018   Furuncle of vulva 09/10/2018   ESRD (end stage renal disease) on dialysis (HCC) 07/04/2018   Anemia in chronic renal disease 12/31/2017   Essential hypertension 12/31/2017   Stroke (HCC) 12/31/2017   Anemia 09/17/2017   Monoclonal gammopathy of unknown significance (MGUS) 09/17/2017   PCP:  Fernand Sigrid HERO, MD Pharmacy:   [id number] [place] [place], [place] - [address] AT [facility] [address] [place] [address] Phone: [phone] Fax:  [phone]     Social Drivers of Health (SDOH) Social History: SDOH Screenings   Food Insecurity: No Food Insecurity (10/22/2023)  Housing: Unknown (02/19/2024)   Received from [facility]  Transportation Needs: No Transportation Needs (10/22/2023)  Utilities: Not At Risk (10/22/2023)  Alcohol Screen: Low Risk (09/06/2022)  Depression (PHQ2-9): Low Risk (09/02/2024)  Tobacco Use: Medium Risk (01/06/2025)   SDOH Interventions:     Readmission Risk Interventions    11/12/2023   12:45 PM 10/23/2023   10:07 AM  Readmission Risk Prevention Plan  Transportation Screening Complete Complete  PCP or Specialist Appt within 5-7 Days  Complete  PCP or  Specialist Appt within 3-5 Days Complete   Home Care Screening  Complete  Medication Review (RN CM)  Complete  HRI or Home Care Consult Complete   Social Work Consult for Recovery Care Planning/Counseling Complete   Palliative Care Screening Complete   Medication Review Oceanographer) Complete

## 2025-01-07 NOTE — Progress Notes (Signed)
 " [facility]  ROUNDING NOTE   Subjective:   Kristen Francis is a 79 y.o. female with past medical history of ESRD on HD, hypertension, hyperlipidemia, CVA, macular degeneration. Patient presents to ED with syncopal episode at dialysis clinic ans was admitted under observation for Syncope [R55]  Patient is known to our practice and receves outpatient dialysis treatments at [facility] on a MWF schedule, supervised by [facility] physicians.  Patient states she was receiving dialysis on Monday, per her usual schedule.  Patient states she remembers waking up during treatment with numerous staff members around her.  Patient has no recollection of cause.   Labs on ED arrival unremarkable for renal patient.  BNP greater than 14,000.  Troponins 89.  White count 3.7 with hemoglobin 11.2.  Chest x-ray shows mild bibasilar atelectasis with negative CT head.  Echo shows EF 60 to 65% with a grade 1 diastolic dysfunction.  We have been consulted to monitor and manage dialysis needs.   Objective:  Vital signs in last 24 hours:  Temp:  [98.4 F (36.9 C)-98.9 F (37.2 C)] 98.9 F (37.2 C) (02/04 1322) Pulse Rate:  [66-77] 76 (02/04 1300) Resp:  [13-24] 18 (02/04 1230) BP: (125-170)/(65-83) 136/80 (02/04 1300) SpO2:  [90 %-99 %] 93 % (02/04 1300)  Weight change:  There were no vitals filed for this visit.  Intake/Output: No intake/output data recorded.   Intake/Output this shift:  No intake/output data recorded.  Physical Exam: General: NAD  Head: Normocephalic, atraumatic. Moist oral mucosal membranes  Eyes: Anicteric  Lungs:  Clear to auscultation, normal effort  Heart: Regular rate and rhythm  Abdomen:  Soft, nontender  Extremities: No peripheral edema.  Neurologic: Awake, alert, conversant  Skin: Warm,dry, no rash  Access: Left AVG    Basic Metabolic Panel: Recent Labs  Lab 01/03/25 1349 01/06/25 1429 01/07/25 0442  NA 144 143 142  K 3.9 4.0 4.1  CL 97* 96* 99   CO2 29 27 25   GLUCOSE 91 103* 77  BUN 57* 72* 75*  CREATININE 8.80* 10.00* 10.10*  CALCIUM  9.7 9.6 8.7*    Liver Function Tests: Recent Labs  Lab 01/03/25 1349 01/06/25 1429 01/07/25 0442  AST 11* 21 19  ALT <5 <5 <5  ALKPHOS 49 48 41  BILITOT 0.2 0.3 0.2  PROT 7.0 6.6 5.8*  ALBUMIN 3.9 3.5 3.1*   No results for input(s): LIPASE, AMYLASE in the last 168 hours. No results for input(s): AMMONIA in the last 168 hours.  CBC: Recent Labs  Lab 01/03/25 1349 01/06/25 1429 01/07/25 0442  WBC 3.2* 3.7* 3.5*  HGB 11.3* 11.2* 9.8*  HCT 35.9* 35.3* 32.0*  MCV 88.9 87.8 89.1  PLT 174 159 155    Cardiac Enzymes: No results for input(s): CKTOTAL, CKMB, CKMBINDEX, TROPONINI in the last 168 hours.  BNP: Invalid input(s): POCBNP  CBG: No results for input(s): GLUCAP in the last 168 hours.  Microbiology: Results for orders placed or performed during the hospital encounter of 01/06/25  Blood culture (routine x 2)     Status: None (Preliminary result)   Collection Time: 01/06/25  6:13 PM   Specimen: BLOOD  Result Value Ref Range Status   Specimen Description BLOOD BLOOD RIGHT FOREARM  Final   Special Requests   Final    BOTTLES DRAWN AEROBIC AND ANAEROBIC Blood Culture results may not be optimal due to an inadequate volume of blood received in culture bottles   Culture   Final  NO GROWTH < 12 HOURS Performed at [facility], [address] [place], [place] [address]    Report Status PENDING  Incomplete  Blood culture (routine x 2)     Status: None (Preliminary result)   Collection Time: 01/06/25  7:01 PM   Specimen: BLOOD  Result Value Ref Range Status   Specimen Description BLOOD BLOOD RIGHT ARM  Final   Special Requests   Final    BOTTLES DRAWN AEROBIC AND ANAEROBIC Blood Culture adequate volume   Culture   Final    NO GROWTH < 12 HOURS Performed at [facility], [address] [place], [place] [address]    Report Status  PENDING  Incomplete    Coagulation Studies: No results for input(s): LABPROT, INR in the last 72 hours.  Urinalysis: No results for input(s): COLORURINE, LABSPEC, PHURINE, GLUCOSEU, HGBUR, BILIRUBINUR, KETONESUR, PROTEINUR, UROBILINOGEN, NITRITE, LEUKOCYTESUR in the last 72 hours.  Invalid input(s): APPERANCEUR    Imaging: ECHOCARDIOGRAM COMPLETE Result Date: 01/07/2025    ECHOCARDIOGRAM REPORT   Patient Name:   Kristen Francis Date of Exam: 01/07/2025 Medical Rec #:  [id number]            Height:       63.0 in Accession #:    [id number]           Weight:       110.0 lb Date of Birth:  01-08-1946            BSA:          1.500 m Patient Age:    78 years             BP:           162/81 mmHg Patient Gender: F                    HR:           69 bpm. Exam Location:  [facility] Procedure: 2D Echo, Cardiac Doppler and Color Doppler (Both Spectral and Color            Flow Doppler were utilized during procedure). STAT ECHO Indications:     Pericardial Effusion I31.3  History:         Patient has prior history of Echocardiogram examinations, most                  recent 10/22/2023. CHF; Stroke. End stage renal disease.  Sonographer:     Christopher Furnace Referring Phys:  [id number] [facility] E DAVIS Diagnosing Phys: Cara JONETTA Lovelace MD  Sonographer Comments: Technically challenging study due to limited acoustic windows and no parasternal window. IMPRESSIONS  1. Limited windows.  2. MV leafet thickening, vegetation can not be completly excluded.  3. Left ventricular ejection fraction, by estimation, is 60 to 65%. The left ventricle has normal function. The left ventricle has no regional wall motion abnormalities. There is moderate asymmetric left ventricular hypertrophy of the septal segment. Left ventricular diastolic parameters are consistent with Grade I diastolic dysfunction (impaired relaxation).  4. Right ventricular systolic function is normal. The right ventricular size is mildly enlarged.   5. Left atrial size was severely dilated.  6. Right atrial size was moderately dilated.  7. Moderate pericardial effusion.  8. The mitral valve is myxomatous. Mild mitral valve regurgitation.  9. The aortic valve is calcified. Aortic valve regurgitation is mild to moderate. Aortic valve sclerosis is present, with no evidence of aortic valve stenosis. Conclusion(s)/Recommendation(s): Poor  windows for evaluation of left ventricular function by transthoracic echocardiography. Would recommend an alternative means of evaluation. FINDINGS  Left Ventricle: Left ventricular ejection fraction, by estimation, is 60 to 65%. The left ventricle has normal function. The left ventricle has no regional wall motion abnormalities. Strain was performed and the global longitudinal strain is indeterminate. The left ventricular internal cavity size was normal in size. There is moderate asymmetric left ventricular hypertrophy of the septal segment. Left ventricular diastolic parameters are consistent with Grade I diastolic dysfunction (impaired relaxation). Right Ventricle: The right ventricular size is mildly enlarged. No increase in right ventricular wall thickness. Right ventricular systolic function is normal. Left Atrium: Left atrial size was severely dilated. Right Atrium: Right atrial size was moderately dilated. Pericardium: A moderately sized pericardial effusion is present. Mitral Valve: The mitral valve is myxomatous. Mild mitral valve regurgitation. MV peak gradient, 6.2 mmHg. The mean mitral valve gradient is 3.0 mmHg. Tricuspid Valve: The tricuspid valve is normal in structure. Tricuspid valve regurgitation is mild. Aortic Valve: The aortic valve is calcified. Aortic valve regurgitation is mild to moderate. Aortic regurgitation PHT measures 1020 msec. Aortic valve sclerosis is present, with no evidence of aortic valve stenosis. Aortic valve mean gradient measures 4.0 mmHg. Aortic valve peak gradient measures 7.6 mmHg.  Aortic valve area, by VTI measures 2.44 cm. Pulmonic Valve: The pulmonic valve was not well visualized. Pulmonic valve regurgitation is not visualized. Aorta: The aortic root was not well visualized. IAS/Shunts: No atrial level shunt detected by color flow Doppler. Additional Comments: Limited windows. MV leafet thickening, vegetation can not be completly excluded. 3D was performed not requiring image post processing on an independent workstation and was indeterminate.  LEFT VENTRICLE PLAX 2D LVIDd:         4.60 cm   Diastology LVIDs:         3.10 cm   LV e' medial:    3.59 cm/s LV PW:         1.20 cm   LV E/e' medial:  16.9 LV IVS:        1.70 cm   LV e' lateral:   4.35 cm/s LVOT diam:     2.00 cm   LV E/e' lateral: 14.0 LV SV:         74 LV SV Index:   50 LVOT Area:     3.14 cm LV IVRT:       87 msec  RIGHT VENTRICLE RV Basal diam:  3.80 cm RV Mid diam:    3.40 cm RV S prime:     15.80 cm/s TAPSE (M-mode): 2.6 cm LEFT ATRIUM           Index        RIGHT ATRIUM           Index LA diam:      4.70 cm 3.13 cm/m   RA Area:     18.70 cm LA Vol (A4C): 83.2 ml 55.47 ml/m  RA Volume:   46.60 ml  31.07 ml/m  AORTIC VALVE AV Area (Vmax):    2.31 cm AV Area (Vmean):   2.37 cm AV Area (VTI):     2.44 cm AV Vmax:           137.50 cm/s AV Vmean:          88.250 cm/s AV VTI:            0.306 m AV Peak Grad:      7.6 mmHg AV Mean  Grad:      4.0 mmHg LVOT Vmax:         101.00 cm/s LVOT Vmean:        66.500 cm/s LVOT VTI:          0.237 m LVOT/AV VTI ratio: 0.78 AI PHT:            1020 msec MITRAL VALVE                TRICUSPID VALVE MV Area (PHT): 5.62 cm     TR Peak grad:   21.5 mmHg MV Area VTI:   2.80 cm     TR Vmax:        232.00 cm/s MV Peak grad:  6.2 mmHg MV Mean grad:  3.0 mmHg     SHUNTS MV Vmax:       1.25 m/s     Systemic VTI:  0.24 m MV Vmean:      81.4 cm/s    Systemic Diam: 2.00 cm MV Decel Time: 135 msec MV E velocity: 60.80 cm/s MV A velocity: 115.00 cm/s MV E/A ratio:  0.53 Dwayne D Callwood MD  Electronically signed by Cara JONETTA Lovelace MD Signature Date/Time: 01/07/2025/12:17:04 PM    Final    CT Head Wo Contrast Result Date: 01/06/2025 EXAM: CT HEAD WITHOUT CONTRAST 01/06/2025 04:40:10 PM TECHNIQUE: CT of the head was performed without the administration of intravenous contrast. Automated exposure control, iterative reconstruction, and/or weight based adjustment of the mA/kV was utilized to reduce the radiation dose to as low as reasonably achievable. COMPARISON: CT head 03/04/2023 and MRI brain 03/04/2023. CLINICAL HISTORY: Syncope. FINDINGS: BRAIN AND VENTRICLES: No acute hemorrhage. No evidence of acute territorial infarct. Age-indeterminate small right cerebellar infarction. Chronic bilateral gangliocapsular infarctions. There is overall similar moderate scattered white matter hypodensities which are nonspecific but most commonly represent chronic microvascular ischemic changes. No hydrocephalus. No extra-axial collection. No mass effect or midline shift. ORBITS: No acute abnormality. SINUSES: Mild right mastoid effusion. SOFT TISSUES AND SKULL: No acute soft tissue abnormality. No skull fracture. Bilateral carotid and vertebral artery calcifications. IMPRESSION: 1. No acute intracranial hemorrhage or calvarial fracture. 2. Age-indeterminate small right cerebellar infarction. Electronically signed by: Prentice Spade MD 01/06/2025 04:56 PM EST RP Workstation: [id number]   DG Chest 2 View Result Date: 01/06/2025 CLINICAL DATA:  Shortness of breath and syncope. EXAM: CHEST - 2 VIEW COMPARISON:  January 03, 2025 FINDINGS: The cardiac silhouette is mildly enlarged and unchanged in size. Mild atelectatic changes are seen within the bilateral lung bases. No pleural effusion or pneumothorax is identified. A radiopaque vascular stent is seen within the proximal left upper extremity. Abdominal pain is seen involving both shoulders and throughout the thoracic spine. IMPRESSION: Mild bibasilar atelectasis.  Electronically Signed   By: Suzen Dials M.D.   On: 01/06/2025 16:36     Medications:    sodium chloride       amLODipine   5 mg Oral Daily   aspirin  EC  81 mg Oral Daily   cinacalcet   120 mg Oral Q supper   heparin   5,000 Units Subcutaneous Q8H   pantoprazole   40 mg Oral Daily   rosuvastatin   10 mg Oral Daily   sevelamer  carbonate  800 mg Oral TID WC   sodium chloride  flush  3 mL Intravenous Q12H   sodium chloride  flush  3 mL Intravenous Q12H   sodium chloride , acetaminophen  **OR** acetaminophen , albuterol , bisacodyl , guaiFENesin -dextromethorphan, hydrALAZINE , ondansetron  **OR** ondansetron  (ZOFRAN ) IV, polyethylene glycol, sodium chloride  flush  Assessment/ Plan:  Ms. Kristen Francis is a 79 y.o.  female with past medical history of ESRD on HD, hypertension, hyperlipidemia, CVA, macular degeneration.  Patient presents for syncopal episode at dialysis and was admitted under observation for Syncope [R55]  [facility]/MWF/left AVG  Syncopal episode, likely secondary to fluid shifts during hemodialysis.  CT head negative.  Cardiac workup negative.  2.  End-stage renal disease on hemodialysis.  Patient received partial treatment on Monday prior to ED arrival.  If remains inpatient, next treatment scheduled for Friday.  3. Anemia of chronic kidney disease  Lab Results  Component Value Date   HGB 9.8 (L) 01/07/2025  Hemoglobin within optimal range.  No indication for ESA's.  4. Secondary Hyperparathyroidism: with outpatient labs: PTH 244, phosphorus 6.4, calcium  10.1 on 10/06/2024.   Lab Results  Component Value Date   CALCIUM  8.7 (L) 01/07/2025   CAION 1.07 (L) 10/25/2023   PHOS 5.0 (H) 10/24/2023    Bone minerals satisfactory at this time   LOS: 0 Kristen Francis 2/4/20263:59 PM   "

## 2025-01-07 NOTE — Progress Notes (Signed)
 Orders only for 7 day holter monitor for syncope.   CARALYN REBEKAH HUDSON, PA

## 2025-01-07 NOTE — Evaluation (Signed)
 Physical Therapy Evaluation Patient Details Name: Marlette Curvin MRN: [id number] DOB: 1946/11/04 Today's Date: 01/07/2025  History of Present Illness  Takiesha Mcdevitt is a 79 y.o. female with PMH of ESRD on hemodialysis (M/W/F), hypertension, hyperlipidemia, CVA, anemia, CHF (EF 35-40%), severe pulmonary hypertension with RVSP, moderate MR/AR, prior pericardial effusion followed by [facility] cardiology (Dr. Wilburn) recently diagnosed with influenza on 01/03/2025 who presents with non-exertional syncope during dialysis.   Clinical Impression  Patient received in bed, she is lethargic appearing and reports fatigue and weakness. She is agreeable to PT/OT assessment. She is mod I with bed mobility. Increased time needed to initiate mobility. Patient is able to stand with cga and ambulated 200 feet without AD and cga. Patient appears to have some weakness and will continue to benefit from skilled PT to improve mobility and strength.         If plan is discharge home, recommend the following: A little help with walking and/or transfers;A little help with bathing/dressing/bathroom   Can travel by private vehicle    yes    Equipment Recommendations None recommended by PT  Recommendations for Other Services       Functional Status Assessment Patient has had a recent decline in their functional status and demonstrates the ability to make significant improvements in function in a reasonable and predictable amount of time.     Precautions / Restrictions Precautions Precautions: Fall Recall of Precautions/Restrictions: Intact Restrictions Weight Bearing Restrictions Per Provider Order: No      Mobility  Bed Mobility Overal bed mobility: Needs Assistance Bed Mobility: Supine to Sit, Sit to Supine     Supine to sit: Supervision Sit to supine: Supervision        Transfers Overall transfer level: Needs assistance Equipment used: None Transfers: Sit to/from Stand Sit to Stand:  Contact guard assist                Ambulation/Gait Ambulation/Gait assistance: Contact guard assist Gait Distance (Feet): 200 Feet Assistive device: None Gait Pattern/deviations: Step-through pattern Gait velocity: decr     General Gait Details: patient slightly unsteady initially, however improves with increased distance. Patient reports weakness.  Stairs            Wheelchair Mobility     Tilt Bed    Modified Rankin (Stroke Patients Only)       Balance Overall balance assessment: Needs assistance Sitting-balance support: Feet supported Sitting balance-Leahy Scale: Good     Standing balance support: No upper extremity supported, During functional activity Standing balance-Leahy Scale: Fair                               Pertinent Vitals/Pain Pain Assessment Pain Assessment: No/denies pain    Home Living Family/patient expects to be discharged to:: Private residence Living Arrangements: Spouse/significant other Available Help at Discharge: Family;Available 24 hours/day Type of Home: House Home Access: Stairs to enter Entrance Stairs-Rails: None Entrance Stairs-Number of Steps: 3   Home Layout: One level Home Equipment: None      Prior Function Prior Level of Function : Independent/Modified Independent;Driving                     Extremity/Trunk Assessment   Upper Extremity Assessment Upper Extremity Assessment: Defer to OT evaluation    Lower Extremity Assessment Lower Extremity Assessment: Generalized weakness    Cervical / Trunk Assessment Cervical / Trunk Assessment: Normal  Communication  Communication Communication: No apparent difficulties    Cognition Arousal: Alert Behavior During Therapy: WFL for tasks assessed/performed   PT - Cognitive impairments: No apparent impairments                         Following commands: Intact       Cueing Cueing Techniques: Verbal cues     General  Comments      Exercises     Assessment/Plan    PT Assessment Patient needs continued PT services  PT Problem List Decreased strength;Decreased mobility;Decreased balance       PT Treatment Interventions Gait training;Stair training;Functional mobility training;Therapeutic activities;Therapeutic exercise;Patient/family education    PT Goals (Current goals can be found in the Care Plan section)  Acute Rehab PT Goals Patient Stated Goal: return home PT Goal Formulation: With patient Time For Goal Achievement: 01/21/25 Potential to Achieve Goals: Good    Frequency Min 2X/week     Co-evaluation               AM-PAC PT 6 Clicks Mobility  Outcome Measure Help needed turning from your back to your side while in a flat bed without using bedrails?: None Help needed moving from lying on your back to sitting on the side of a flat bed without using bedrails?: None Help needed moving to and from a bed to a chair (including a wheelchair)?: A Little Help needed standing up from a chair using your arms (e.g., wheelchair or bedside chair)?: A Little Help needed to walk in hospital room?: A Little Help needed climbing 3-5 steps with a railing? : A Little 6 Click Score: 20    End of Session   Activity Tolerance: Patient tolerated treatment well Patient left: in bed;with call bell/phone within reach Nurse Communication: Mobility status PT Visit Diagnosis: Other abnormalities of gait and mobility (R26.89);Muscle weakness (generalized) (M62.81);Difficulty in walking, not elsewhere classified (R26.2)    Time: 9076-9065 PT Time Calculation (min) (ACUTE ONLY): 11 min   Charges:   PT Evaluation $PT Eval Low Complexity: 1 Low   PT General Charges $$ ACUTE PT VISIT: 1 Visit         Amali Uhls, PT, GCS 01/07/25,10:31 AM

## 2025-01-07 NOTE — Discharge Instructions (Signed)
 Resume your Hemodialysis on your out pt days

## 2025-01-07 NOTE — Discharge Summary (Signed)
 " Physician Discharge Summary   Patient: Kristen Francis MRN: [id number] DOB: 1946-07-02  Admit date:     01/06/2025  Discharge date: 01/07/25  Discharge Physician: Leita Blanch   PCP: Fernand Sigrid HERO, MD   Recommendations at discharge:    Follow-up [facility] cardiology in two weeks. Patient to get cardiac monitor evaluation.  Resume your hemodialysis on your outpatient schedule.  Follow-up with PCP in 1 to 2 week  Discharge Diagnoses: Principal Problem:   Syncope   Kristen Francis is a 79 y.o. female with PMH of ESRD on hemodialysis (M/W/F), hypertension, hyperlipidemia, CVA, anemia, CHF (EF 35-40%), severe pulmonary hypertension with RVSP, moderate MR/AR, prior pericardial effusion followed by [facility] cardiology (Dr. Wilburn) recently diagnosed with influenza on 01/03/2025 who presents with non-exertional syncope during dialysis.  Patient was diagnosed with the flu after 3 days of cough and generalized weakness on 1/31.  She felt like she was recovering.  She attended dialysis today and was hooked up to the machine.  She received about half of her dialysis and states that the next thing she remembers is waking up.  She denies any preceding lightheadedness chest pain shortness of breath.   Syncope -- etiology unclear. Workup essentially negative. -- Seen by [facility] cardiology. Cardiac monitor applied. Patient will be evaluated in two weeks.- -Continue home meds. Patient overall remained hemodynamically stable in sinus rhythm -- Echo overall looks okay Pickard mitral valve leaflet but this has been noted on prior records. -- Patient denies chest pain -- patient remained on room air. -- No source of infection -- okay to discharge from cardiology standpoint  # Chronic HFrEF, NICM -- volume managed by dialysis. -- BNP elevated however no signs symptoms of heart failure. Sats remains stable.  # Pulmonary hypertension  # ESRD on HD  -- patient will resume home dialysis routine. Seen by nephrology  no further workup needed okay to discharge.  Discussed with patient and husband at bedside. Patient is eager to go home.     Consultants: [facility] cards Procedures performed: none  Disposition: Home Diet recommendation:  Cardiac diet DISCHARGE MEDICATION: Allergies as of 01/07/2025       Reactions   Other Other (See Comments)   Pt does not receive Blood    Pneumococcal 20-val Conj Vacc Other (See Comments)   Cefepime  Other (See Comments)   Penicillins Hives, Dermatitis   Has patient had a PCN reaction causing immediate rash, facial/tongue/throat swelling, SOB or lightheadedness with hypotension: YES Has patient had a PCN reaction causing severe rash involving mucus membranes or skin necrosis: no Has patient had a PCN reaction that required hospitalization: no Has patient had a PCN reaction occurring within the last 10 years: no If all of the above answers are NO, then may proceed with Cephalosporin use. Has patient had a PCN reaction causing immediate rash, facial/tongue/throat swelling, SOB or lightheadedness with hypotension: YES Has patient had a PCN reaction causing severe rash involving mucus membranes or skin necrosis: no Has patient had a PCN reaction that required hospitalization: no Has patient had a PCN reaction occurring within the last 10 years: no If all of the above answers are NO, then may proceed with Cephalosporin use.   Sodium Bicarbonate Itching        Medication List     STOP taking these medications    losartan  50 MG tablet Commonly known as: COZAAR        TAKE these medications    amLODipine  10 MG tablet Commonly known  as: NORVASC  TAKE 1 TABLET(10 MG) BY MOUTH DAILY   aspirin  EC 81 MG tablet Take 1 tablet (81 mg total) by mouth daily. Swallow whole.   carvedilol  25 MG tablet Commonly known as: COREG  TAKE 1 TABLET(25 MG) BY MOUTH TWICE DAILY WITH A MEAL   cholecalciferol  1000 units tablet Commonly known as: VITAMIN D  Take 1,000 Units by mouth  daily.   cinacalcet  60 MG tablet Commonly known as: SENSIPAR  Take 2 tablets (120 mg total) by mouth daily with supper.   feeding supplement (NEPRO CARB STEADY) Liqd Take 237 mLs by mouth 2 (two) times daily with a meal.   ferrous sulfate  325 (65 FE) MG tablet Commonly known as: FeroSul Take 1 tablet (325 mg total) by mouth 2 (two) times daily with a meal. What changed: medication strength   hydrALAZINE  50 MG tablet Commonly known as: APRESOLINE  TAKE 1 TABLET(50 MG) BY MOUTH THREE TIMES DAILY   omeprazole  40 MG capsule Commonly known as: PRILOSEC TAKE ONE CAPSULE BY MOUTH DAILY   Renal-Vite 0.8 MG Tabs Take 0.8 mg by mouth every evening. On dialysis days   rosuvastatin  10 MG tablet Commonly known as: CRESTOR  TAKE 1 TABLET BY MOUTH ON NON DIALYSIS DAYS   sacubitril -valsartan  49-51 MG Commonly known as: ENTRESTO  Take 1 tablet by mouth every 12 (twelve) hours.   sennosides-docusate sodium  8.6-50 MG tablet Commonly known as: SENOKOT-S Take 2 tablets by mouth daily.   sevelamer  carbonate 800 MG tablet Commonly known as: RENVELA  Take 1,600 mg by mouth 3 (three) times daily with meals.        Follow-up Information     Alluri, Keller BROCKS, MD. Go in 1 week(s).   Specialty: Cardiology Contact information: [address] [place] [address] [phone]         Fernand Sigrid HERO, MD. Schedule an appointment as soon as possible for a visit in 1 week(s).   Specialties: Internal Medicine, Cardiology Contact information: [address] [place] [address] [phone]                Patient alert and oriented times three not an acute distress respiratory clear to auscultation no respiratory distress cardiovascular both heart sounds normal no murmur. Neuro- grossly intact Condition at discharge: fair  The results of significant diagnostics from this hospitalization (including imaging, microbiology, ancillary and laboratory) are listed below for  reference.   Imaging Studies: CT Head Wo Contrast Result Date: 01/06/2025 EXAM: CT HEAD WITHOUT CONTRAST 01/06/2025 04:40:10 PM TECHNIQUE: CT of the head was performed without the administration of intravenous contrast. Automated exposure control, iterative reconstruction, and/or weight based adjustment of the mA/kV was utilized to reduce the radiation dose to as low as reasonably achievable. COMPARISON: CT head 03/04/2023 and MRI brain 03/04/2023. CLINICAL HISTORY: Syncope. FINDINGS: BRAIN AND VENTRICLES: No acute hemorrhage. No evidence of acute territorial infarct. Age-indeterminate small right cerebellar infarction. Chronic bilateral gangliocapsular infarctions. There is overall similar moderate scattered white matter hypodensities which are nonspecific but most commonly represent chronic microvascular ischemic changes. No hydrocephalus. No extra-axial collection. No mass effect or midline shift. ORBITS: No acute abnormality. SINUSES: Mild right mastoid effusion. SOFT TISSUES AND SKULL: No acute soft tissue abnormality. No skull fracture. Bilateral carotid and vertebral artery calcifications. IMPRESSION: 1. No acute intracranial hemorrhage or calvarial fracture. 2. Age-indeterminate small right cerebellar infarction. Electronically signed by: Prentice Spade MD 01/06/2025 04:56 PM EST RP Workstation: [id number]   DG Chest 2 View Result Date: 01/06/2025 CLINICAL DATA:  Shortness of breath and  syncope. EXAM: CHEST - 2 VIEW COMPARISON:  January 03, 2025 FINDINGS: The cardiac silhouette is mildly enlarged and unchanged in size. Mild atelectatic changes are seen within the bilateral lung bases. No pleural effusion or pneumothorax is identified. A radiopaque vascular stent is seen within the proximal left upper extremity. Abdominal pain is seen involving both shoulders and throughout the thoracic spine. IMPRESSION: Mild bibasilar atelectasis. Electronically Signed   By: Suzen Dials M.D.   On: 01/06/2025  16:36   DG Chest 2 View Result Date: 01/03/2025 CLINICAL DATA:  Cough, weakness, flu like symptoms EXAM: CHEST - 2 VIEW COMPARISON:  08/26/2024 FINDINGS: Frontal and lateral views of the chest demonstrate stable enlargement of the cardiac silhouette. No acute airspace disease, effusion, or pneumothorax. No acute bony abnormalities. IMPRESSION: 1. Stable chest, no acute process. Electronically Signed   By: Ozell Daring M.D.   On: 01/03/2025 18:31    Microbiology: Results for orders placed or performed during the hospital encounter of 01/06/25  Blood culture (routine x 2)     Status: None (Preliminary result)   Collection Time: 01/06/25  6:13 PM   Specimen: BLOOD  Result Value Ref Range Status   Specimen Description BLOOD BLOOD RIGHT FOREARM  Final   Special Requests   Final    BOTTLES DRAWN AEROBIC AND ANAEROBIC Blood Culture results may not be optimal due to an inadequate volume of blood received in culture bottles   Culture   Final    NO GROWTH < 12 HOURS Performed at [facility], [address] [place], [place] [address]    Report Status PENDING  Incomplete  Blood culture (routine x 2)     Status: None (Preliminary result)   Collection Time: 01/06/25  7:01 PM   Specimen: BLOOD  Result Value Ref Range Status   Specimen Description BLOOD BLOOD RIGHT ARM  Final   Special Requests   Final    BOTTLES DRAWN AEROBIC AND ANAEROBIC Blood Culture adequate volume   Culture   Final    NO GROWTH < 12 HOURS Performed at [facility], [address] [place], [place] [address]    Report Status PENDING  Incomplete    Labs: CBC: Recent Labs  Lab 01/03/25 1349 01/06/25 1429 01/07/25 0442  WBC 3.2* 3.7* 3.5*  HGB 11.3* 11.2* 9.8*  HCT 35.9* 35.3* 32.0*  MCV 88.9 87.8 89.1  PLT 174 159 155   Basic Metabolic Panel: Recent Labs  Lab 01/03/25 1349 01/06/25 1429 01/07/25 0442  NA 144 143 142  K 3.9 4.0 4.1  CL 97* 96* 99  CO2 29 27 25   GLUCOSE 91 103* 77   BUN 57* 72* 75*  CREATININE 8.80* 10.00* 10.10*  CALCIUM  9.7 9.6 8.7*   Liver Function Tests: Recent Labs  Lab 01/03/25 1349 01/06/25 1429 01/07/25 0442  AST 11* 21 19  ALT <5 <5 <5  ALKPHOS 49 48 41  BILITOT 0.2 0.3 0.2  PROT 7.0 6.6 5.8*  ALBUMIN 3.9 3.5 3.1*   Discharge time spent: greater than 30 minutes.  Signed: Leita Blanch, MD [facility] 01/07/2025 "

## 2025-01-07 NOTE — Consult Note (Cosign Needed)
 " [facility] CLINIC CARDIOLOGY CONSULT NOTE       Patient ID: Kristen Francis MRN: [id number] DOB/AGE: 1946/10/02 79 y.o.  Admit date: 01/06/2025 Referring Physician Dr. Rolland Moats Primary Physician Fernand Sigrid HERO, MD  Primary Cardiologist Dr. Wilburn Reason for Consultation Syncope  HPI: Kristen Francis is a 79 y.o. female  with a past medical history of chronic HFrEF, moderate pulmonary hypertension, moderate MR, ESRD on HD (AV fistula on left upper arm; M/W/F), hypertension, hyperlipidemia, CVA, GERD, anemia who presented to the ED on 01/06/2025 for syncope. Cardiology was consulted for further evaluation.   Patient brought to the ED after an episode of syncope during HD yesterday. Workup in the ED notable for creatinine 10.00, potassium 4.0, hemoglobin 11.2, WBC 3.7. Troponins 89 > 92, BNP 14,371. EKG in the ED NSR PVCs rate 64 bpm. CXR with mild atelectasis. CT head with no acute abnormality but did note small R cerebellar infarct.  At the time of my evaluation this AM, patient is resting comfortably in hospital bed. We discussed her symptoms in further detail. She states that during dialysis yesterday she began feeling some lightheadedness and then passed out. She denies any CP or SOB around the time of this episode. Endorses occasional intermittent palpitations which have been ongoing for months. States that she is feeling better overall today. No events noted on tele.   Review of systems complete and found to be negative unless listed above    Past Medical History:  Diagnosis Date   Anemia    Arthritis    CHF (congestive heart failure) (HCC)    Chronic kidney disease    ESRD   Complication of anesthesia    had procedure and felt incision early 80s   End stage renal disease on dialysis (HCC)    Gout    Hyperlipemia    Hypertension    Macular degeneration, right eye    Stroke [facility])     Past Surgical History:  Procedure Laterality Date   A/V FISTULAGRAM Left 12/25/2018    Procedure: A/V FISTULAGRAM;  Surgeon: Jama Cordella MATSU, MD;  Location: [facility] INVASIVE CV LAB;  Service: Cardiovascular;  Laterality: Left;   A/V FISTULAGRAM Left 10/09/2023   Procedure: A/V Fistulagram;  Surgeon: Jama Cordella MATSU, MD;  Location: [facility] INVASIVE CV LAB;  Service: Cardiovascular;  Laterality: Left;   ABDOMINAL HYSTERECTOMY     APPENDECTOMY     AV FISTULA PLACEMENT Left 07/12/2018   Procedure: INSERTION OF ARTERIOVENOUS (AV) GORE-TEX GRAFT ARM ( BRACHIAL AXILLARY );  Surgeon: Jama Cordella MATSU, MD;  Location: [facility] ORS;  Service: Vascular;  Laterality: Left;   COLONOSCOPY WITH PROPOFOL  N/A 01/14/2016   Procedure: COLONOSCOPY WITH PROPOFOL ;  Surgeon: Lamar ONEIDA Holmes, MD;  Location: [facility] ENDOSCOPY;  Service: Endoscopy;  Laterality: N/A;   COLONOSCOPY WITH PROPOFOL  N/A 02/14/2022   Procedure: COLONOSCOPY WITH PROPOFOL ;  Surgeon: Maryruth Ole ONEIDA, MD;  Location: [facility] ENDOSCOPY;  Service: Endoscopy;  Laterality: N/A;   RIGHT/LEFT HEART CATH AND CORONARY ANGIOGRAPHY N/A 10/25/2023   Procedure: RIGHT/LEFT HEART CATH AND CORONARY ANGIOGRAPHY;  Surgeon: Ammon Blunt, MD;  Location: [facility] INVASIVE CV LAB;  Service: Cardiovascular;  Laterality: N/A;   TONSILLECTOMY      (Not in a hospital admission)  Social History   Socioeconomic History   Marital status: Married    Spouse name: Not on file   Number of children: Not on file   Years of education: Not on file   Highest education level: Not on file  Occupational History   Not on file  Tobacco Use   Smoking status: Former   Smokeless tobacco: Never  Vaping Use   Vaping status: Never Used  Substance and Sexual Activity   Alcohol use: No   Drug use: No   Sexual activity: Not on file  Other Topics Concern   Not on file  Social History Narrative   Lives in  [place]; used to clean; no smoking; no alcohol.    Social Drivers of Health   Tobacco Use: Medium Risk (01/06/2025)   Patient History    Smoking Tobacco Use:  Former    Smokeless Tobacco Use: Never    Passive Exposure: Not on file  Financial Resource Strain: Not on file  Food Insecurity: No Food Insecurity (10/22/2023)   Hunger Vital Sign    Worried About Running Out of Food in the Last Year: Never true    Ran Out of Food in the Last Year: Never true  Transportation Needs: No Transportation Needs (10/22/2023)   PRAPARE - Administrator, Civil Service (Medical): No    Lack of Transportation (Non-Medical): No  Physical Activity: Not on file  Stress: Not on file  Social Connections: Not on file  Intimate Partner Violence: Not At Risk (10/22/2023)   Humiliation, Afraid, Rape, and Kick questionnaire    Fear of Current or Ex-Partner: No    Emotionally Abused: No    Physically Abused: No    Sexually Abused: No  Depression (PHQ2-9): Low Risk (09/02/2024)   Depression (PHQ2-9)    PHQ-2 Score: 0  Alcohol Screen: Low Risk (09/06/2022)   Alcohol Screen    Last Alcohol Screening Score (AUDIT): 0  Housing: Unknown (02/19/2024)   Received from [facility]   Epic    Unable to Pay for Housing in the Last Year: Not on file    Number of Times Moved in the Last Year: Not on file    At any time in the past 12 months, were you homeless or living in a shelter (including now)?: No  Utilities: Not At Risk (10/22/2023)   [employer] Utilities    Threatened with loss of utilities: No  Health Literacy: Not on file    Family History  Problem Relation Age of Onset   Kidney disease Mother    Breast cancer Neg Hx      Vitals:   01/07/25 0630 01/07/25 0700 01/07/25 0730 01/07/25 0833  BP: (!) 162/81 (!) 157/80 (!) 147/75   Pulse: 69 70 71   Resp: 19 13 (!) 21   Temp:    98.7 F (37.1 C)  TempSrc:    Oral  SpO2: 97% 99% 97%     PHYSICAL EXAM General: Chronically ill appearing elderly female, well nourished, in no acute distress. HEENT: Normocephalic and atraumatic. Neck: No JVD.  Lungs: Normal respiratory effort on room air.  Clear bilaterally to auscultation. No wheezes, crackles, rhonchi.  Heart: HRRR. Normal S1 and S2 without gallops or murmurs.  Abdomen: Non-distended appearing.  Msk: Normal strength and tone for age. Extremities: Warm and well perfused. No clubbing, cyanosis. No edema.  Neuro: Alert and oriented X 3. Psych: Answers questions appropriately.   Labs: Basic Metabolic Panel: Recent Labs    01/06/25 1429 01/07/25 0442  NA 143 142  K 4.0 4.1  CL 96* 99  CO2 27 25  GLUCOSE 103* 77  BUN 72* 75*  CREATININE 10.00* 10.10*  CALCIUM  9.6 8.7*   Liver Function Tests: Recent Labs  01/06/25 1429 01/07/25 0442  AST 21 19  ALT <5 <5  ALKPHOS 48 41  BILITOT 0.3 0.2  PROT 6.6 5.8*  ALBUMIN 3.5 3.1*   No results for input(s): LIPASE, AMYLASE in the last 72 hours. CBC: Recent Labs    01/06/25 1429 01/07/25 0442  WBC 3.7* 3.5*  HGB 11.2* 9.8*  HCT 35.3* 32.0*  MCV 87.8 89.1  PLT 159 155   Cardiac Enzymes: No results for input(s): CKTOTAL, CKMB, CKMBINDEX, TROPONINIHS in the last 72 hours. BNP: No results for input(s): BNP in the last 72 hours. D-Dimer: No results for input(s): DDIMER in the last 72 hours. Hemoglobin A1C: No results for input(s): HGBA1C in the last 72 hours. Fasting Lipid Panel: No results for input(s): CHOL, HDL, LDLCALC, TRIG, CHOLHDL, LDLDIRECT in the last 72 hours. Thyroid  Function Tests: Recent Labs    01/06/25 1545  TSH 1.310   Anemia Panel: No results for input(s): VITAMINB12, FOLATE, FERRITIN, TIBC, IRON , RETICCTPCT in the last 72 hours.   Radiology: CT Head Wo Contrast Result Date: 01/06/2025 EXAM: CT HEAD WITHOUT CONTRAST 01/06/2025 04:40:10 PM TECHNIQUE: CT of the head was performed without the administration of intravenous contrast. Automated exposure control, iterative reconstruction, and/or weight based adjustment of the mA/kV was utilized to reduce the radiation dose to as low as reasonably  achievable. COMPARISON: CT head 03/04/2023 and MRI brain 03/04/2023. CLINICAL HISTORY: Syncope. FINDINGS: BRAIN AND VENTRICLES: No acute hemorrhage. No evidence of acute territorial infarct. Age-indeterminate small right cerebellar infarction. Chronic bilateral gangliocapsular infarctions. There is overall similar moderate scattered white matter hypodensities which are nonspecific but most commonly represent chronic microvascular ischemic changes. No hydrocephalus. No extra-axial collection. No mass effect or midline shift. ORBITS: No acute abnormality. SINUSES: Mild right mastoid effusion. SOFT TISSUES AND SKULL: No acute soft tissue abnormality. No skull fracture. Bilateral carotid and vertebral artery calcifications. IMPRESSION: 1. No acute intracranial hemorrhage or calvarial fracture. 2. Age-indeterminate small right cerebellar infarction. Electronically signed by: Prentice Spade MD 01/06/2025 04:56 PM EST RP Workstation: [id number]   DG Chest 2 View Result Date: 01/06/2025 CLINICAL DATA:  Shortness of breath and syncope. EXAM: CHEST - 2 VIEW COMPARISON:  January 03, 2025 FINDINGS: The cardiac silhouette is mildly enlarged and unchanged in size. Mild atelectatic changes are seen within the bilateral lung bases. No pleural effusion or pneumothorax is identified. A radiopaque vascular stent is seen within the proximal left upper extremity. Abdominal pain is seen involving both shoulders and throughout the thoracic spine. IMPRESSION: Mild bibasilar atelectasis. Electronically Signed   By: Suzen Dials M.D.   On: 01/06/2025 16:36   DG Chest 2 View Result Date: 01/03/2025 CLINICAL DATA:  Cough, weakness, flu like symptoms EXAM: CHEST - 2 VIEW COMPARISON:  08/26/2024 FINDINGS: Frontal and lateral views of the chest demonstrate stable enlargement of the cardiac silhouette. No acute airspace disease, effusion, or pneumothorax. No acute bony abnormalities. IMPRESSION: 1. Stable chest, no acute process.  Electronically Signed   By: Ozell Daring M.D.   On: 01/03/2025 18:31    ECHO pending  TELEMETRY (personally reviewed): sinus rhythm PACs rate 70s  EKG (personally reviewed): NSR PVCs rate 64 bpm  Data reviewed by me 01/07/2025: last 24h vitals tele labs imaging I/O ED provider note, admission H&P  Principal Problem:   Syncope    ASSESSMENT AND PLAN:  Kristen Francis is a 79 y.o. female  with a past medical history of chronic HFrEF, moderate pulmonary hypertension, moderate MR, ESRD on HD (AV fistula on  left upper arm; M/W/F), hypertension, hyperlipidemia, CVA, GERD, anemia who presented to the ED on 01/06/2025 for syncope. Cardiology was consulted for further evaluation.   # Syncope # Chronic HFrEF, NICM # Pulmonary hypertension # ESRD on HD Patient presented after syncopal episode at HD yesterday. ?orthostasis, no events noted on telemetry thus far. Troponins minimal and flat. Repeat echo pending. No reports of CP, SOB recently.  -Further recommendations pending echo results.  -Continue crestor  10 mg daily and aspirin  81 mg daily.  -Continue hydralazine  as ordered for elevated BP. Continue amlodipine  5 mg daily.  -Minimal and flat troponins most consistent with demand/supply mismatch and not ACS. -Would recommend cardiac monitor on DC.   This patient's plan of care was discussed and created with Dr. Florencio and he is in agreement.  Signed: Danita Bloch, PA-C  01/07/2025, 9:48 AM [facility]      "

## 2025-01-07 NOTE — Evaluation (Signed)
 Occupational Therapy Evaluation Patient Details Name: Kristen Francis MRN: [id number] DOB: 04/27/1946 Today's Date: 01/07/2025   History of Present Illness   Kristen Francis is a 79 y.o. female with PMH of ESRD on hemodialysis (M/W/F), hypertension, hyperlipidemia, CVA, anemia, CHF (EF 35-40%), severe pulmonary hypertension with RVSP, moderate MR/AR, prior pericardial effusion followed by [facility] cardiology (Dr. Wilburn) recently diagnosed with influenza on 01/03/2025 who presents with non-exertional syncope during dialysis.     Clinical Impressions Patient was seen for OT evaluation this date. Prior to hospital admission, patient was independent with ADLs and mobility, driving self to HD MWF. She is lethargic during OT/PT eval but agreeable. Able to perform bed mobility with mod I, LB dressing with supervision, sit<>stand with CGA and ambulate ~200 feet with CGA without AD. Patient lives in single story home with spouse who is able to assist as needed.  Patient is close to baseline for ADL performance and therefore does not require OT after discharge however OT will follow while in hospital to address functional activity tolerance and gross strength deficits.      If plan is discharge home, recommend the following:   A little help with walking and/or transfers;A little help with bathing/dressing/bathroom     Functional Status Assessment   Patient has had a recent decline in their functional status and demonstrates the ability to make significant improvements in function in a reasonable and predictable amount of time.     Equipment Recommendations   None recommended by OT     Recommendations for Other Services         Precautions/Restrictions   Precautions Precautions: Fall Recall of Precautions/Restrictions: Intact Restrictions Weight Bearing Restrictions Per Provider Order: No     Mobility Bed Mobility Overal bed mobility: Needs Assistance Bed Mobility:  Supine to Sit, Sit to Supine     Supine to sit: Supervision Sit to supine: Supervision        Transfers Overall transfer level: Needs assistance Equipment used: None Transfers: Sit to/from Stand Sit to Stand: Contact guard assist                  Balance Overall balance assessment: Needs assistance Sitting-balance support: Feet supported Sitting balance-Leahy Scale: Good     Standing balance support: No upper extremity supported, During functional activity Standing balance-Leahy Scale: Fair                             ADL either performed or assessed with clinical judgement   ADL Overall ADL's : Needs assistance/impaired                                       General ADL Comments: recommending supervision/set up A for ADLs, educated on importance of sitting vs standing for energy conservation and fall prevention strategies     Vision         Perception         Praxis         Pertinent Vitals/Pain Pain Assessment Pain Assessment: No/denies pain     Extremity/Trunk Assessment Upper Extremity Assessment Upper Extremity Assessment: Overall WFL for tasks assessed   Lower Extremity Assessment Lower Extremity Assessment: Generalized weakness   Cervical / Trunk Assessment Cervical / Trunk Assessment: Normal   Communication Communication Communication: No apparent difficulties   Cognition Arousal: Alert Behavior During Therapy: [facility]  for tasks assessed/performed Cognition: No apparent impairments                               Following commands: Intact       Cueing  General Comments   Cueing Techniques: Verbal cues      Exercises     Shoulder Instructions      Home Living Family/patient expects to be discharged to:: Private residence Living Arrangements: Spouse/significant other Available Help at Discharge: Family;Available 24 hours/day Type of Home: House Home Access: Stairs to enter [employer] of Steps: 3 Entrance Stairs-Rails: None Home Layout: One level     Bathroom Shower/Tub: Producer, Television/film/video: Standard Bathroom Accessibility: Yes How Accessible: Accessible via walker Home Equipment: None          Prior Functioning/Environment Prior Level of Function : Independent/Modified Independent;Driving             Mobility Comments: ambulated without AD ADLs Comments: independent, drove to HD    OT Problem List: Decreased activity tolerance;Decreased strength   OT Treatment/Interventions: Self-care/ADL training;Therapeutic exercise;Energy conservation;DME and/or AE instruction;Therapeutic activities;Patient/family education      OT Goals(Current goals can be found in the care plan section)   Acute Rehab OT Goals Patient Stated Goal: to go home OT Goal Formulation: With patient Time For Goal Achievement: 01/21/25 Potential to Achieve Goals: Good ADL Goals Pt Will Perform Grooming: with modified independence;standing Pt Will Perform Lower Body Dressing: with modified independence;sit to/from stand Pt Will Transfer to Toilet: with modified independence;stand pivot transfer;regular height toilet   OT Frequency:  Min 2X/week    Co-evaluation              AM-PAC OT 6 Clicks Daily Activity     Outcome Measure Help from another person eating meals?: None Help from another person taking care of personal grooming?: None Help from another person toileting, which includes using toliet, bedpan, or urinal?: None Help from another person bathing (including washing, rinsing, drying)?: None Help from another person to put on and taking off regular upper body clothing?: None Help from another person to put on and taking off regular lower body clothing?: A Little 6 Click Score: 23   End of Session Nurse Communication: Mobility status  Activity Tolerance: Patient tolerated treatment well Patient left: in bed;with call bell/phone  within reach  OT Visit Diagnosis: Muscle weakness (generalized) (M62.81)                Time: 9076-9065 OT Time Calculation (min): 11 min Charges:  OT General Charges $OT Visit: 1 Visit OT Evaluation $OT Eval Low Complexity: 1 Low  Rogers Clause, OT/L MSOT, 01/07/2025

## 2025-01-07 NOTE — Progress Notes (Signed)
*  PRELIMINARY RESULTS* Echocardiogram 2D Echocardiogram has been performed.  Kristen Francis 01/07/2025, 7:48 AM

## 2025-01-08 ENCOUNTER — Emergency Department
Admission: EM | Admit: 2025-01-08 | Discharge: 2025-01-08 | Disposition: A | Discharge status: Home / Self Care | Payer: Medicare (Managed Care) | Source: Home / Self Care | Attending: Emergency Medicine | Admitting: Emergency Medicine

## 2025-01-08 ENCOUNTER — Other Ambulatory Visit: Payer: Self-pay

## 2025-01-08 ENCOUNTER — Emergency Department: Payer: Medicare (Managed Care)

## 2025-01-08 DIAGNOSIS — R079 Chest pain, unspecified: Secondary | ICD-10-CM

## 2025-01-08 LAB — CBC
HCT: 36.3 % (ref 36.0–46.0)
Hemoglobin: 11.4 g/dL — ABNORMAL LOW (ref 12.0–15.0)
MCH: 27.3 pg (ref 26.0–34.0)
MCHC: 31.4 g/dL (ref 30.0–36.0)
MCV: 87.1 fL (ref 80.0–100.0)
Platelets: 153 10*3/uL (ref 150–400)
RBC: 4.17 MIL/uL (ref 3.87–5.11)
RDW: 15.4 % (ref 11.5–15.5)
WBC: 3.4 10*3/uL — ABNORMAL LOW (ref 4.0–10.5)
nRBC: 0 % (ref 0.0–0.2)

## 2025-01-08 LAB — BASIC METABOLIC PANEL WITH GFR
Anion gap: 15 (ref 5–15)
BUN: 28 mg/dL — ABNORMAL HIGH (ref 8–23)
CO2: 31 mmol/L (ref 22–32)
Calcium: 8.5 mg/dL — ABNORMAL LOW (ref 8.9–10.3)
Chloride: 94 mmol/L — ABNORMAL LOW (ref 98–111)
Creatinine, Ser: 5.06 mg/dL — ABNORMAL HIGH (ref 0.44–1.00)
GFR, Estimated: 8 mL/min — ABNORMAL LOW
Glucose, Bld: 78 mg/dL (ref 70–99)
Potassium: 3.6 mmol/L (ref 3.5–5.1)
Sodium: 140 mmol/L (ref 135–145)

## 2025-01-08 LAB — TROPONIN T, HIGH SENSITIVITY
Troponin T High Sensitivity: 103 ng/L (ref 0–19)
Troponin T High Sensitivity: 100 ng/L (ref 0–19)

## 2025-01-08 NOTE — ED Notes (Signed)
 MD Jessup informed of trop of 103

## 2025-01-08 NOTE — ED Provider Notes (Signed)
 "  [facility] Provider Note    Event Date/Time   First MD Initiated Contact with Patient 01/08/25 1231     (approximate)   History   Chief Complaint Chest Pain   HPI  Kristen Francis is a 79 y.o. female with past medical history of hypertension, diabetes, stroke, anemia, and ESRD on HD who presents to the ED complaining of chest pain.  Patient reports that she recently had a cardiac monitor placed following admission for syncope, believes she lost it at home earlier today before going for her dialysis treatment.  She completed a treatment without difficulty, but then states staff recommended she come to the ED due to missing her monitor.  Patient reports having some pain in her chest earlier today that has since resolved, is unable to describe the pain.  She denies any fevers, cough, or difficulty breathing, but was recently diagnosed with the flu on 1/31.     Physical Exam   Triage Vital Signs: ED Triage Vitals  Encounter Vitals Group     BP 01/08/25 1006 130/69     Girls Systolic BP Percentile --      Girls Diastolic BP Percentile --      Boys Systolic BP Percentile --      Boys Diastolic BP Percentile --      Pulse Rate 01/08/25 1006 72     Resp 01/08/25 1006 18     Temp 01/08/25 1006 98 F (36.7 C)     Temp src --      SpO2 01/08/25 1006 99 %     Weight 01/08/25 1007 110 lb (49.9 kg)     Height 01/08/25 1007 5' 3 (1.6 m)     Head Circumference --      Peak Flow --      Pain Score 01/08/25 1004 5     Pain Loc --      Pain Education --      Exclude from Growth Chart --     Most recent vital signs: Vitals:   01/08/25 1006  BP: 130/69  Pulse: 72  Resp: 18  Temp: 98 F (36.7 C)  SpO2: 99%    Constitutional: Alert and oriented. Eyes: Conjunctivae are normal. Head: Atraumatic. Nose: No congestion/rhinnorhea. Mouth/Throat: Mucous membranes are moist.  Cardiovascular: Normal rate, regular rhythm. Grossly normal heart sounds.  2+  radial pulses bilaterally.  Left upper extremity AV fistula with palpable thrill. Respiratory: Normal respiratory effort.  No retractions. Lungs CTAB. Gastrointestinal: Soft and nontender. No distention. Musculoskeletal: No lower extremity tenderness nor edema.  Neurologic:  Normal speech and language. No gross focal neurologic deficits are appreciated.    ED Results / Procedures / Treatments   Labs (all labs ordered are listed, but only abnormal results are displayed) Labs Reviewed  BASIC METABOLIC PANEL WITH GFR - Abnormal; Notable for the following components:      Result Value   Chloride 94 (*)    BUN 28 (*)    Creatinine, Ser 5.06 (*)    Calcium  8.5 (*)    GFR, Estimated 8 (*)    All other components within normal limits  CBC - Abnormal; Notable for the following components:   WBC 3.4 (*)    Hemoglobin 11.4 (*)    All other components within normal limits  TROPONIN T, HIGH SENSITIVITY - Abnormal; Notable for the following components:   Troponin T High Sensitivity 103 (*)    All other components within normal limits  TROPONIN T, HIGH SENSITIVITY - Abnormal; Notable for the following components:   Troponin T High Sensitivity 100 (*)    All other components within normal limits     EKG  ED ECG REPORT I, Carlin Palin, the attending physician, personally viewed and interpreted this ECG.   Date: 01/08/2025  EKG Time: 10:09  Rate: 72  Rhythm: normal sinus rhythm, frequent PVC's noted  Axis: LAD  Intervals:none  ST&T Change: None  RADIOLOGY Chest x-ray reviewed and interpreted by me with small bilateral effusions, no infiltrate or edema noted.  PROCEDURES:  Critical Care performed: No  Procedures   MEDICATIONS ORDERED IN ED: Medications - No data to display   IMPRESSION / MDM / ASSESSMENT AND PLAN / ED COURSE  I reviewed the triage vital signs and the nursing notes.                              79 y.o. female with past medical history of hypertension,  diabetes, stroke, anemia, and ESRD on HD presents to the ED complaining of losing her cardiac monitor today with an episode of chest pain, since resolved.  Patient's presentation is most consistent with acute presentation with potential threat to life or bodily function.  Differential diagnosis includes, but is not limited to, ACS, PE, pneumonia, pneumothorax, musculoskeletal pain, GERD, anxiety, influenza.  Patient well-appearing and in no acute distress, vital signs are unremarkable.  EKG shows no evidence of arrhythmia or ischemia and patient does not even recall complaining of chest pain earlier.  Initial troponin elevated but similar to prior baseline, will trend.  Chest x-ray negative for acute finding and additional labs show known ESRD but without acute electrolyte abnormality, anemia, or leukocytosis.  I will attempt to reach her husband to see if he can locate the monitor at home.  Patient complained of a swollen area in her groin at 1 point, but later stated it has been there since 1970.  I did assess the area and did not appreciate any evidence of swelling or abscess.  Repeat troponin stable compared to previous and patient continues to deny any chest pain here in the ED.  Husband now at bedside and has the missing piece to her Holter monitor.  Patient appropriate for discharge home with outpatient cardiology follow-up, which she has scheduled in about 2 weeks.  She was counseled to return to the ED for new or worsening symptoms, patient and husband agree with plan.      FINAL CLINICAL IMPRESSION(S) / ED DIAGNOSES   Final diagnoses:  Nonspecific chest pain     Rx / DC Orders   ED Discharge Orders     None        Note:  This document was prepared using Dragon voice recognition software and may include unintentional dictation errors.   Palin Carlin, MD 01/08/25 1435  "

## 2025-01-08 NOTE — ED Triage Notes (Addendum)
 Pt comes with c/o losing her heart monitor. Pt states she needs it back on real quick. Pt then states she is having cp. Pt states this started two days ago. Pt was seen here in the ED two days ago.

## 2025-01-08 NOTE — ED Notes (Signed)
 Husband came with piece of heart monitor. Pt still had other piece on chest. This RN placed it back in the pocket but it is beeping with red light. Unknown if it is placed correctly and back to working.

## 2025-01-09 ENCOUNTER — Telehealth: Payer: Self-pay | Admitting: Internal Medicine

## 2025-01-09 LAB — CULTURE, BLOOD (ROUTINE X 2)
Culture: NO GROWTH
Culture: NO GROWTH
Special Requests: ADEQUATE

## 2025-01-09 NOTE — Telephone Encounter (Signed)
 Lvm to schedule ED follow up-Toni

## 2025-09-03 ENCOUNTER — Ambulatory Visit: Payer: Medicare (Managed Care) | Admitting: Nurse Practitioner
